# Patient Record
Sex: Male | Born: 1955 | Race: Black or African American | Hispanic: No | Marital: Married | Smoking: Never smoker
Health system: Southern US, Community
[De-identification: ages and names within clinical notes are randomized; demographics above are authoritative.]

## PROBLEM LIST (undated history)

## (undated) DIAGNOSIS — B20 Human immunodeficiency virus [HIV] disease: Secondary | ICD-10-CM

## (undated) DIAGNOSIS — Z21 Asymptomatic human immunodeficiency virus [HIV] infection status: Secondary | ICD-10-CM

## (undated) DIAGNOSIS — I255 Ischemic cardiomyopathy: Secondary | ICD-10-CM

## (undated) DIAGNOSIS — K219 Gastro-esophageal reflux disease without esophagitis: Secondary | ICD-10-CM

## (undated) DIAGNOSIS — I2510 Atherosclerotic heart disease of native coronary artery without angina pectoris: Secondary | ICD-10-CM

## (undated) DIAGNOSIS — I10 Essential (primary) hypertension: Secondary | ICD-10-CM

## (undated) DIAGNOSIS — I639 Cerebral infarction, unspecified: Secondary | ICD-10-CM

## (undated) HISTORY — DX: Gastro-esophageal reflux disease without esophagitis: K21.9

---

## 1997-12-03 ENCOUNTER — Encounter: Admission: RE | Admit: 1997-12-03 | Discharge: 1997-12-03 | Payer: Self-pay | Admitting: Internal Medicine

## 1998-03-28 ENCOUNTER — Encounter: Admission: RE | Admit: 1998-03-28 | Discharge: 1998-03-28 | Payer: Self-pay | Admitting: Internal Medicine

## 1998-06-23 ENCOUNTER — Encounter: Admission: RE | Admit: 1998-06-23 | Discharge: 1998-06-23 | Payer: Self-pay | Admitting: Internal Medicine

## 1998-08-15 ENCOUNTER — Encounter: Admission: RE | Admit: 1998-08-15 | Discharge: 1998-08-15 | Payer: Self-pay | Admitting: Internal Medicine

## 1998-09-19 ENCOUNTER — Encounter: Admission: RE | Admit: 1998-09-19 | Discharge: 1998-09-19 | Payer: Self-pay | Admitting: Internal Medicine

## 1998-12-04 ENCOUNTER — Encounter: Admission: RE | Admit: 1998-12-04 | Discharge: 1998-12-04 | Payer: Self-pay | Admitting: Hematology and Oncology

## 1999-01-08 ENCOUNTER — Encounter: Admission: RE | Admit: 1999-01-08 | Discharge: 1999-01-08 | Payer: Self-pay | Admitting: Hematology and Oncology

## 1999-01-29 ENCOUNTER — Encounter: Admission: RE | Admit: 1999-01-29 | Discharge: 1999-01-29 | Payer: Self-pay | Admitting: Hematology and Oncology

## 1999-05-07 ENCOUNTER — Ambulatory Visit (HOSPITAL_COMMUNITY): Admission: RE | Admit: 1999-05-07 | Discharge: 1999-05-07 | Payer: Self-pay | Admitting: Internal Medicine

## 1999-06-17 ENCOUNTER — Encounter: Admission: RE | Admit: 1999-06-17 | Discharge: 1999-06-17 | Payer: Self-pay | Admitting: Hematology and Oncology

## 1999-07-13 ENCOUNTER — Encounter: Admission: RE | Admit: 1999-07-13 | Discharge: 1999-07-13 | Payer: Self-pay | Admitting: Internal Medicine

## 1999-07-30 ENCOUNTER — Encounter: Admission: RE | Admit: 1999-07-30 | Discharge: 1999-07-30 | Payer: Self-pay | Admitting: Internal Medicine

## 1999-08-27 ENCOUNTER — Encounter: Admission: RE | Admit: 1999-08-27 | Discharge: 1999-08-27 | Payer: Self-pay | Admitting: Internal Medicine

## 1999-11-09 ENCOUNTER — Ambulatory Visit (HOSPITAL_COMMUNITY): Admission: RE | Admit: 1999-11-09 | Discharge: 1999-11-09 | Payer: Self-pay | Admitting: Internal Medicine

## 1999-11-09 ENCOUNTER — Encounter: Admission: RE | Admit: 1999-11-09 | Discharge: 1999-11-09 | Payer: Self-pay | Admitting: Internal Medicine

## 1999-11-10 ENCOUNTER — Other Ambulatory Visit: Admission: RE | Admit: 1999-11-10 | Discharge: 1999-11-10 | Payer: Self-pay | Admitting: *Deleted

## 1999-12-10 ENCOUNTER — Encounter: Admission: RE | Admit: 1999-12-10 | Discharge: 1999-12-10 | Payer: Self-pay | Admitting: Internal Medicine

## 2000-01-21 ENCOUNTER — Ambulatory Visit (HOSPITAL_COMMUNITY): Admission: RE | Admit: 2000-01-21 | Discharge: 2000-01-21 | Payer: Self-pay | Admitting: Hematology and Oncology

## 2000-01-21 ENCOUNTER — Encounter: Admission: RE | Admit: 2000-01-21 | Discharge: 2000-01-21 | Payer: Self-pay | Admitting: Hematology and Oncology

## 2000-02-08 ENCOUNTER — Ambulatory Visit (HOSPITAL_COMMUNITY): Admission: RE | Admit: 2000-02-08 | Discharge: 2000-02-08 | Payer: Self-pay | Admitting: Internal Medicine

## 2000-02-08 ENCOUNTER — Encounter: Admission: RE | Admit: 2000-02-08 | Discharge: 2000-02-08 | Payer: Self-pay

## 2000-02-08 ENCOUNTER — Encounter: Payer: Self-pay | Admitting: Internal Medicine

## 2000-02-11 ENCOUNTER — Encounter: Admission: RE | Admit: 2000-02-11 | Discharge: 2000-02-11 | Payer: Self-pay | Admitting: Hematology and Oncology

## 2000-02-16 ENCOUNTER — Ambulatory Visit (HOSPITAL_COMMUNITY): Admission: RE | Admit: 2000-02-16 | Discharge: 2000-02-16 | Payer: Self-pay | Admitting: Internal Medicine

## 2000-02-16 ENCOUNTER — Encounter: Payer: Self-pay | Admitting: Internal Medicine

## 2000-02-16 ENCOUNTER — Encounter: Admission: RE | Admit: 2000-02-16 | Discharge: 2000-02-16 | Payer: Self-pay | Admitting: Internal Medicine

## 2000-02-19 ENCOUNTER — Inpatient Hospital Stay (HOSPITAL_COMMUNITY): Admission: EM | Admit: 2000-02-19 | Discharge: 2000-02-25 | Payer: Self-pay | Admitting: Emergency Medicine

## 2000-02-19 ENCOUNTER — Encounter (INDEPENDENT_AMBULATORY_CARE_PROVIDER_SITE_OTHER): Payer: Self-pay | Admitting: Specialist

## 2000-02-19 ENCOUNTER — Encounter: Payer: Self-pay | Admitting: Internal Medicine

## 2000-02-24 ENCOUNTER — Encounter: Payer: Self-pay | Admitting: Internal Medicine

## 2000-02-25 ENCOUNTER — Encounter: Payer: Self-pay | Admitting: Internal Medicine

## 2000-03-03 ENCOUNTER — Encounter: Admission: RE | Admit: 2000-03-03 | Discharge: 2000-03-03 | Payer: Self-pay | Admitting: Hematology and Oncology

## 2000-03-07 ENCOUNTER — Encounter: Admission: RE | Admit: 2000-03-07 | Discharge: 2000-03-07 | Payer: Self-pay | Admitting: Internal Medicine

## 2000-03-08 ENCOUNTER — Inpatient Hospital Stay (HOSPITAL_COMMUNITY): Admission: EM | Admit: 2000-03-08 | Discharge: 2000-03-11 | Payer: Self-pay | Admitting: Emergency Medicine

## 2000-03-09 ENCOUNTER — Encounter: Payer: Self-pay | Admitting: Internal Medicine

## 2000-03-28 ENCOUNTER — Encounter: Admission: RE | Admit: 2000-03-28 | Discharge: 2000-03-28 | Payer: Self-pay | Admitting: Internal Medicine

## 2000-04-18 ENCOUNTER — Ambulatory Visit (HOSPITAL_COMMUNITY): Admission: RE | Admit: 2000-04-18 | Discharge: 2000-04-18 | Payer: Self-pay | Admitting: Internal Medicine

## 2000-04-18 ENCOUNTER — Encounter: Admission: RE | Admit: 2000-04-18 | Discharge: 2000-04-18 | Payer: Self-pay | Admitting: Internal Medicine

## 2000-05-09 ENCOUNTER — Encounter: Admission: RE | Admit: 2000-05-09 | Discharge: 2000-05-09 | Payer: Self-pay | Admitting: Internal Medicine

## 2000-05-11 ENCOUNTER — Encounter: Admission: RE | Admit: 2000-05-11 | Discharge: 2000-05-11 | Payer: Self-pay | Admitting: Internal Medicine

## 2000-05-20 ENCOUNTER — Ambulatory Visit (HOSPITAL_COMMUNITY): Admission: RE | Admit: 2000-05-20 | Discharge: 2000-05-20 | Payer: Self-pay | Admitting: *Deleted

## 2000-06-13 ENCOUNTER — Encounter: Admission: RE | Admit: 2000-06-13 | Discharge: 2000-06-13 | Payer: Self-pay | Admitting: Internal Medicine

## 2000-08-03 ENCOUNTER — Encounter: Admission: RE | Admit: 2000-08-03 | Discharge: 2000-08-03 | Payer: Self-pay | Admitting: Internal Medicine

## 2000-08-03 ENCOUNTER — Ambulatory Visit (HOSPITAL_COMMUNITY): Admission: RE | Admit: 2000-08-03 | Discharge: 2000-08-03 | Payer: Self-pay | Admitting: Internal Medicine

## 2000-08-23 ENCOUNTER — Encounter: Admission: RE | Admit: 2000-08-23 | Discharge: 2000-08-23 | Payer: Self-pay | Admitting: Hematology and Oncology

## 2000-09-26 ENCOUNTER — Encounter: Admission: RE | Admit: 2000-09-26 | Discharge: 2000-09-26 | Payer: Self-pay

## 2000-10-14 ENCOUNTER — Encounter: Payer: Self-pay | Admitting: Internal Medicine

## 2000-10-14 ENCOUNTER — Ambulatory Visit (HOSPITAL_COMMUNITY): Admission: RE | Admit: 2000-10-14 | Discharge: 2000-10-14 | Payer: Self-pay | Admitting: Internal Medicine

## 2000-10-14 ENCOUNTER — Encounter: Admission: RE | Admit: 2000-10-14 | Discharge: 2000-10-14 | Payer: Self-pay | Admitting: Internal Medicine

## 2001-09-28 ENCOUNTER — Inpatient Hospital Stay (HOSPITAL_COMMUNITY): Admission: EM | Admit: 2001-09-28 | Discharge: 2001-10-03 | Payer: Self-pay | Admitting: Emergency Medicine

## 2001-09-28 ENCOUNTER — Encounter: Payer: Self-pay | Admitting: Emergency Medicine

## 2001-09-29 ENCOUNTER — Encounter: Payer: Self-pay | Admitting: Internal Medicine

## 2006-08-09 DIAGNOSIS — I639 Cerebral infarction, unspecified: Secondary | ICD-10-CM | POA: Insufficient documentation

## 2007-04-02 ENCOUNTER — Encounter (INDEPENDENT_AMBULATORY_CARE_PROVIDER_SITE_OTHER): Payer: Self-pay | Admitting: Neurology

## 2007-04-02 ENCOUNTER — Inpatient Hospital Stay (HOSPITAL_COMMUNITY): Admission: EM | Admit: 2007-04-02 | Discharge: 2007-04-06 | Payer: Self-pay | Admitting: Emergency Medicine

## 2007-04-03 ENCOUNTER — Encounter (INDEPENDENT_AMBULATORY_CARE_PROVIDER_SITE_OTHER): Payer: Self-pay | Admitting: Neurology

## 2007-04-05 ENCOUNTER — Encounter (INDEPENDENT_AMBULATORY_CARE_PROVIDER_SITE_OTHER): Payer: Self-pay | Admitting: Neurology

## 2008-04-30 ENCOUNTER — Emergency Department (HOSPITAL_COMMUNITY): Admission: EM | Admit: 2008-04-30 | Discharge: 2008-04-30 | Payer: Self-pay | Admitting: Emergency Medicine

## 2010-12-22 NOTE — Discharge Summary (Signed)
Richard [name], Richard [name]           ACCOUNT NO.:  [id number]   MEDICAL RECORD NO.:  [id number]          PATIENT TYPE:  INP   LOCATION:  3037                         FACILITY:  [facility]   PHYSICIAN:  Pramod P. Pearlean Brownie, MD    DATE OF BIRTH:  06-Jul-1956   DATE OF ADMISSION:  04/02/2007  DATE OF DISCHARGE:  04/06/2007                               DISCHARGE SUMMARY   DIAGNOSES AT TIME OF DISCHARGE:  1. Right MCA infarct likely embolic though no source found.  2. Dyslipidemia.  3. Nonischemic cardiomyopathy with EF of 30-35%.  4. Human immunodeficiency virus positive.  5. Depression.  6. Shingles/herpes breakout.   MEDICINES AT TIME OF DISCHARGE:  1. Invega 3 mg a day.  2. Valacyclovir 1 g a day.  3. Viramune 200 mg b.i.d.  4. Emtriva 200 mg a day.  5. Aspirin 81 mg a day q.a.m. x2 weeks, then discontinue.  6. Aggrenox one q.h.s. x2 weeks, then increase to b.i.d.  7. Coreg 6.25 mg b.i.d.  8. Altace 2.5 mg a day.  9. Zocor 20 mg a day.   STUDIES PERFORMED:  1. CT of the brain on admission shows a high suspicion for an acute      right insular cortex/external capsule infarct involving the MCA      territory, no acute hemorrhage.  2. MRI of the brain shows moderate sized acute nonhemorrhagic infarct      right temporal lobe, periopercular and subinsular region infarct,      cervical spondylitic changes with spinal stenosis and cord      compression at C3, C4 incompletely evaluated.  3. MRA of the head shows abrupt cutoff of the proximal right middle      cerebral artery branch vessel consistent with the patient's acute      infarct.  4. 2-D echocardiogram shows EF of 35-40% with diffuse left ventricular      hypokinesis, moderate to severe global hypokinesis, no embolic      source.  5. Transcranial Doppler is performed, results pending.  6. Carotid Doppler shows no ICA stenosis.  7. EKG shows normal sinus rhythm with occasional PVC's, left axis      deviation, low voltage QRS.  inferior infarct age undetermined, ST      and T wave abnormality.  Consider anterolateral ischemia.      Additional cardiologist comment included no significant change      since last tracing.  8. Transesophageal echocardiogram shows no PFO, no ASA, negative      Doppler with negative bubble, no source of embolism, EF 30-35%.   LABORATORY STUDIES:  CBC with hemoglobin 13.1, hematocrit 38.9, white  blood cells 2, platelets 118, MCV 84.2.  Chemistry normal. Coagulation  studies normal, liver function tests normal.  Cardiac enzymes negative.  Cholesterol 176, triglycerides 67, HDL 27, LDL 136.  UA negative.  A  viral load pending.  BNP 123, CD4 less than 10.  TSH 1.69.  Homocystine  13.4.  HIV test is reactive.  Urine drug screen negative, acetone level  negative.  Alcohol level less than 5.  Hemoglobin A1c 5.7.   HISTORY  OF PRESENT ILLNESS:  Richard [name] is a 55 year old right-  handed black male with a history of HIV positivity.  He says his viral  load was 3000 and a CD-4 count was up around 100 at last check, and he  is on medications for that.  He has a history of depression treated with  Invega.  He has had a recent increase in his Invega dose from 3 to 6 mg  on Friday.  He has taken one dose.  He was in his normal state of health  the day prior to admission around 6 p.m. when he fell asleep in front of  the TV.  He woke at 2 a.m. with slurred speech, left-sided weakness.  He  went back to bed.  The next morning his symptoms were still present so  he came to the urgent care.  Was seen by Dr. Perrin Maltese and referred to the  emergency room after speaking with Dr. Orlin Hilding who was on call.  He was  not a TPA candidate secondary to time of onset being greater than three  hours.  He has been admitted to the hospital for further stroke  evaluation with an NIH stroke scale at 2.   HOSPITAL COURSE:  MRI was positive for acute infarct in the right MCA  branch pattern.  It is felt to be embolic  though no source was  documented during hospitalization.  His EKG and telemetry strip showed  normal sinus rhythm.  He did have a low EF of 30-35% which may  contribute, and cardiology was consulted.  They plan to follow up as an  outpatient.  They did perform a TEE in the hospital which was negative  for embolic source.  Other risk factors identified during  hospitalization were dyslipidemia for which he was started on Zocor.  He  was started on Aggrenox for secondary stroke prevention.  From the  neurologic standpoint there is no need for Coumadin.  Of note his CD-4  level was less than 10, and his viral load is pending as somewhat  questionable.  He may have AIDS now as well.  Have asked him to follow  up his primary care at the [place].  Asked to follow up with cardiology  within one week and follow up  Dr. Delia Heady within one month for a TCT bubble study.   CONDITION ON DISCHARGE:  The patient alert and oriented x3.  No aphasia.  No dysarthria.  Eye movements are full.  Face symmetric.  No focal  deficits.  He had decreased fine motor movement on the left and he  orbits his right over the left.   DISCHARGE PLAN:  1. Discharge home with wife.  2. Aggrenox for stroke prevention.  3. New statin for elevated cholesterol.  LDL goal is less than 100.      This needs to be adjusted by primary care or cardiology.  4. New cardiac meds per cardiology.  He has been scheduled a follow-up      appointment with Dr. Tresa Endo on      September 4 at 10 a.m.  5. The patient needs outpatient CardioNet monitoring for atrial      fibrillation for possible embolic source.  6. Call Dr. Marlis Edelson office to schedule an outpatient bubble study and      emboli monitoring within one month.      Annie Main, N.P.    ______________________________  Sunny Schlein. Pearlean Brownie, MD    SB/MEDQ  D:  04/06/2007  T:  04/07/2007  Job:  [id number]   cc:   [facility]  Tresa Endo, M.D.

## 2010-12-22 NOTE — H&P (Signed)
Richard [name], Richard [name]           ACCOUNT NO.:  [id number]   MEDICAL RECORD NO.:  [id number]          PATIENT TYPE:  EMS   LOCATION:  MAJO                         FACILITY:  [facility]   PHYSICIAN:  Gustavus Messing. Orlin Hilding, M.D.DATE OF BIRTH:  1956-01-04   DATE OF ADMISSION:  04/02/2007  DATE OF DISCHARGE:                              HISTORY & PHYSICAL   CHIEF COMPLAINT:  Slurred speech and left-sided weakness, which is  improving.   HISTORY OF PRESENT ILLNESS:  Richard [name] is a 55 year old right-sided  handed black man with a history of HIV positivity.  He says his viral  load was 3000 and a CD4 was around 100.  He is on medications for that.  He also has a history of depression, treated with Invega.  He had a  recent increase in his Invega dose from 3 mg to 6 mg on Friday.  He took  1 dose.  He was in his normal state of health yesterday around 6 p.m.  when he fell asleep in front of the TV.  He woke up around 2 a.m. with  slurred speech, left-sided weakness.  He went to bed.  His symptoms were  not gone in the morning, so he came into urgent care and was seen by Dr.  Perrin Maltese and referred to the emergency room after speaking with me.   REVIEW OF SYSTEMS:  Twelve system review is significant for one episode  of vomiting today.  He has not had any nausea or vomiting prior to that.  He has HIV positivity and depression.  He has no known clotting  abnormalities.  No musculoskeletal complaints.  No cardiac complaints,  although he has a chronically abnormal EKG.  No chest pain.  No  shortness of breath.   PAST MEDICAL HISTORY:  Significant for HIV positive status on meds.  Depression.  He gets shingles and herpes breakouts related to his HIV.  He denies any cardiac history, but has had an abnormal EKG here in the  past and again today without clear acute abnormality seen.  He said he  previously had trouble with his cholesterol but not currently.  He is on  no meds for that.  No history  of atrial fibrillation or hypertension.   MEDICATIONS:  1. Invega 6 mg a day for the last 2 days.  Previously it was 3 mg a      day.  2. Emtricitabine 200 mg once a day.  3. Nevirapine 200 mg twice a day.  4. Valacyclovir 1 gram daily.  Normally he takes acyclovir 200 mg 2      tablets 3 times a day.  He is not on that now that he is on the      valacyclovir.   Sometimes he takes sulfa, but not currently on that rotation.   SOCIAL HISTORY:  He is a Naval architect.  He is married.  No cigarette or  alcohol use.   FAMILY HISTORY:  Positive for stroke in his great grandmother.   OBJECTIVE:  VITALS:  Temperature is 96.6, blood pressure 112/70, heart  rate 72, respirations 16, 98% saturation.  HEENT:  Head is normocephalic, atraumatic.  NECK:  Supple without bruits  HEART:  Regular rate and rhythm.  LUNGS:  Clear to auscultation.  ABDOMEN:  Positive bowel sounds.  SKIN:  He is a quarter sized herpetic lesion in the suprapubic area.  NEUROLOGIC:  He is awake, alert, oriented.  Pupils are equal and  reactive.  Visual fields are full.  Extraocular movements are intact.  Facial sensation is normal.  Facial motor activity shows a left facial  droop.  Hearing is intact.  Palate is symmetric and tongue is midline.  He has a normal shoulder shrug.  On motor exam, he has got no drift or  satelliting.  Normal bulk, tone and strength.  5/5 strength bilaterally.  Normal rapid fine movements.  No clumsiness.  Deep tendon reflexes are  2+.  He has an upgoing toe on the left.  He has a downgoing toe on the  right.  Coordination - finger-to-nose and heel-to-shin are intact.  Sensory exam is normal.  There is no extinction.   On the NIH stroke scale he scores a 2.  He is alert - gets 0.  Answers  two questions correctly - gets a 0.  Obeys two commands correctly - gets  a 0.  Normal gaze - gets a 0.  No visual loss - gets a 0.  He has minor  facial weakness on the left - gets a 1.  No drift in the  upper or lower  extremities, right or left - gets a 0 for all of those.  No ataxia -  gets a 0.  Normal sensory - gets a 0.  No aphasia - gets a 0.  Mild  dysarthria - gets a 1.  No neglect - gets a 0.  So his total score is 2.  Modified Rankin scale is 0.   LABORATORY DATA:  CMET is fairly normal.  Glucose is 100.  CBC shows a  low white blood cell count of 2.4, which I believe may be chronic for  him.  PT is 12.9, INR 1.0, PTT 26.  CT and MRI are pending at this time.   ASSESSMENT:  Left hemiparesis with dysarthria and left facial droop,  sudden onset overnight with resolution of extremity weakness, but  persisting facial droop and dysarthria.  Patient has no risk factors,  but he is HIV positive and depressed.  He had a recent change in his  medicines.  Need to rule out TIA or stroke in the right brain, rule out  medication-related.  He also has HIV, which is apparently quiescent.  He  had an abnormal EKG without any acute changes.   PLAN:  We will admit him for a stroke workup.  I will reduce the Invega  back to 3 mg a day.  That can be addressed with his primary care  physicians and psychiatrist at a later date if his rules in or out for  stroke.  We will put him on aspirin and he will get the workup.  He is  not a TPA candidate, as there has been greater than 3 hours since  symptoms onset and his stroke severity is too mild with a NIH of 2.      Catherine A. Orlin Hilding, M.D.  Electronically Signed     CAW/MEDQ  D:  04/02/2007  T:  04/02/2007  Job:  [id number]

## 2010-12-25 NOTE — Discharge Summary (Signed)
[place]. [facility]  Patient:    Richard [name], Richard [name]                  MRN: [id number] Adm. Date:  [id number] Disc. Date: [id number] Attending:  Alfonso Ramus CC:         [facility]  Nancee Liter, M.D.   Discharge Summary  DATE OF BIRTH:  03/27/1956  DISCHARGE DIAGNOSES: 1. Human immunodeficiency virus. 2. Vomiting. 3. Dehydration. DD:  03/11/00 TD:  03/14/00 Job: [id number]

## 2010-12-25 NOTE — Consult Note (Signed)
[place]. [facility]  Patient:    Richard [name], Richard [name]                  MRN: [id number] Proc. Date: 02/21/00 Adm. Date:  [id number] Attending:  Alfonso Ramus CC:         Richard [name], M.D.                          Consultation Report  REASON FOR CONSULTATION:  Richard [name] is a 55 year old [place] man referred for evaluation of lymphadenopathy and an abnormal spleen.  Richard [name] was found to be HIV positive in 1993.  He was referred to the [facility] and received treatment with AZT and another drug for some months.  He tolerated these poorly however and he was essentially off treatment since 1993 to approximately one to two weeks ago when he presented with a variety of symptoms including thrush, diarrhea, and vertigo.  The patients diarrhea is being treated empirically with Flagyl and Cipro.  His vertigo is improved.  His thrush is also improved on Diflucan.  The vertigo was evaluated with a CT of the brain which was essentially negative.  A chest x-ray obtained 02/19/00 showed a prominent right hilum and CT of the chest on 02/20/00 showed right paratracheal lymphadenopathy measuring up to 2.5 cm, a subcarinal mass measuring up to 3.2 cm, and a right hilar lymph node measuring up to 2.5 cm.  There are other right hilar lymph nodes as well as some areas of decreased density in the visualized portion of the spleen.  With this information, the patient was referred for further evaluation for possible lymphoma.  PAST MEDICAL HISTORY:  The patient has a history of psychotic depression under the care of Dr. Wynonia Lawman.  The patient has a history of urogenital HSV disease, currently under control.  PAST SURGICAL HISTORY:  Otherwise entirely negative.  FAMILY HISTORY:  He knows little about his father.  His mother is currently 35 years old and in good health.  He has two brothers and three sisters - all according to him in good  health.  SOCIAL HISTORY:  The patient worked a temporary in the [employer] for many years.  His wife of nine years is [place].  She has a child Richard [name] who is the patients stepson.  Richard [name] unfortunately is also HIV positive.  I do not know whether Richard [name] has been tested or not. 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  Richard [name] is currently unemployed.  They attend the [employer].  HEALTH MAINTENANCE:  He does not know his cholesterol level.  He smoked minimally when he was 55 years old.  He never abused alcohol or used any alcohol.  He does have a history of drug abuse although he tells me has not used any drugs over the past year.  The patient does "not yet" have a living will.  In case he would becomes medically incapacitated, he would name his wife Richard [name] as his healthcare power of attorney.  REVIEW OF SYSTEMS:  He has had cough which is frequently dry but occasionally productive of sputum which tends to be clear, occasionally slightly yellowish. He has had low-grade fevers but he does not have a thermometer.  He has had some weight loss and a good deal of fatigue.  There has been no rash.  His thrush is now much better and he can swallow with much less  difficulty.  He does not have a history of seizures, unusual headaches, nausea or vomiting, or change in bladder habits.  PHYSICAL EXAMINATION:  VITAL SIGNS:  He is a middle-aged black male with a temperature of 97.6, pulse of 74, respiratory rate between 16-24, and a blood pressure of 115/67.  His room air saturation is 99%.  His current weight is 165.5.  He is estimated to be 5 feet 9-1/2 inches tall.  NECK:  Careful examination shows no cervical, supraclavicular, or axillary adenopathy.  LUNGS:  No crackles or wheezes.  HEART:  Regular rate and rhythm without murmur.  ABDOMEN:  Soft and nontender.  I do not detect splenomegaly by exam.  MUSCULOSKELETAL:  No peripheral edema.  No focal spinal  tenderness.  NEUROLOGIC:  Nonfocal, and he is alert and oriented x 3.  LABORATORIES AT ADMISSION:  White cell count of 2.0, with an absolute granulocyte count of 1.4, absolute lymphocyte count of 0.4, his hemoglobin is 11.7, with an MCV of 74.2.  His platelet count is 163,000.  Sodium 129, potassium 3.4, chloride 98, carbon dioxide 23, glucose 115, BUN 15, creatinine 1.0, calcium 9.0.  Total protein 7.9, albumin 3.7, AST 49, ALT 31, alkaline phosphatase 72, bilirubin 0.4.  LDL is elevated at 369.  Lipase is 43.  APT is 15.4 seconds.  Amylase is 173.  APTT is 29.  Multiple cultures are currently pending.  He is a serum cryptococcal antigen negative. Magnesium was 1.9.  Phosphorus 2.2.  Vitamin B12 was 616.  FILMS:  As described.  IMPRESSION AND PLAN:  A 55 year old [place] man with a history of HIV positivity dating back to 1993, with a CD4 count on April 2001 of less than 10 cells and less than 1% CD4 cells, with mediastinal lymphadenopathy and inhomogeneities in the spleen suggestive of possible lymphoma.  The patient certainly could have lymphoma.  He could have sarcoid or he could any of number of infections including fungal and AFB infections.  The possibilities are too numerous for empiric treatment and he will require definitive biopsy.  I think the most direct and least traumatic of obtaining a lymph node would be mediastinoscopy and I have contacted Dr. Samuel Germany to suggest referral to ______ for this.  If the patient proves to have a non-Hodgkins lymphoma, we will follow up with him for appropriate treatment and he will call my office for an appointment in approximately two weeks.  The patient already has that information.  He would need other staging studies at that time.  If this is an infectious process or sarcoidosis, however, of course, we will discontinue follow up. DD:  02/21/00 TD:  02/21/00 Job: [id number]

## 2010-12-25 NOTE — Discharge Summary (Signed)
[place]. [facility]  Patient:    Richard [name], Richard [name]                  MRN: [id number] Adm. Date:  [id number] Disc. Date: [id number] Attending:  Alfonso Ramus Dictator:   Adrian Prows CC:         Nancee Liter, M.D., outpatient clinic             [facility]             Valentino Hue. Magrinat, M.D., oncology             Kathlee Nations Suann Larry, M.D., CVTS                           Discharge Summary  DATE OF BIRTH:  11-30-1955.  DISCHARGE DIAGNOSES: 1. Mediastinal mass. 2. Status post mediastinoscopy and transbronchial biopsy. 3. Chronic diarrhea. 4. Human immunodeficiency virus. 5. Depression with psychotic features. 6. Herpes simplex virus perirectal ulcer. 7. Oral thrush. 8. Anemia. 9. History of thrombocytopenia.  DISCHARGE MEDICATIONS: 1. Septra one p.o. q.d. 2. Diflucan 200 mg p.o. b.i.d. 3. Combivir one p.o. b.i.d. 4. Nelfinavir 1250 mg p.o. b.i.d. 5. Prozac 20 mg p.o. q.d. 6. Darvocet-N 100 as per Kathlee Nations Trigt III, M.D. 7. Zyprexa 5 mg p.o. q.d. 8. Acyclovir 800 mg p.o. t.i.d.  FOLLOW-UP: 1. As scheduled with Valentino Hue. Magrinat, M.D. 2. As scheduled with Kathlee Nations Suann Larry, M.D. 3. Scheduled in outpatient clinic with Nancee Liter, M.D., and Adrian Prows    on March 03, 2000, at 3:30 p.m.  Please check perianal ulcer, thrush, the    patients symptoms of diarrhea and vomiting.  Please also check viral load    two to three weeks after July 1j9, 2001.  Please check CBC with    differential with special attention to neutropenia.  Also call or the    patient may call _______ center at [phone] regarding temporary    disability.  CONSULTANTS: 1. Valentino Hue. Magrinat, M.D., oncologist. 2. Kathlee Nations Suann Larry, M.D., CVTS.  PROCEDURES: 1. February 19, 2000, chest x-ray. 2. February 20, 2000, CT of the chest with right paratracheal node 2.5 x 1.8 cm,    subcarinal mass 2.7 x 3.2 cm, 1.8 x 2.5 cm right hilar node and several    smaller  right hilar nodes. 3. February 20, 2000, CT of the head was negative. 4. February 24, 2000, mediastinoscopy and transbronchial biopsy.  HISTORY OF PRESENT ILLNESS:  The patient is a 55 year old male with HIV disease with CD4 of less than 10 who presented with vomiting x several days, diarrhea x several months, cough x several weeks with a chest x-ray as an outpatient showing lymphadenopathy.  He was admitted for work-up, diagnosis and treatment.  HOSPITAL COURSE:  #1 -  VOMITING.  Work-up for that problem included lipase of 43, normal LFTs, except for AST of 47; urinalysis showing small leukocytes and positive ketones but otherwise normal and blood glucose of 96.  Viral gastroenteritis, disseminated infection, and GI involvement, lymphoma were all possibilities entertained as causative. Medications were also considered and Cipro and Flagyl were discontinued after admission without immediate asset.  However, the patients vomiting spontaneously resolved two days after admission before other medications could be discontinued.  #2 - DIARRHEA.  Diarrhea was treated symptomatically with Imodium and stool studies were performed.  Giardia, Cryptosporidium, Microsporidium, and AFB smear were all negative as were  C. difficile x 3.  A serum AFB culture was pending at the patients discharge.  #3 - COUGH AND MEDIASTINAL LYMPHADENOPATHY.  Though the cough resolved during hospitalization, chest CT was performed on February 20, 2000, showing 2.5 x 1.8 cm right paratracheal node, 2.7 x 3.2 cm subcarinal mass, 1.8 x 2.5 cm right hilar nodes and several other smaller right hilar nodes.  Cardiovascular and thoracic surgery was consulted and agreed to perform mediastinoscopy and bronchoscopy with biopsy on February 24, 2000.  The patient was stable after surgery and was discharged to home on February 25, 2000.  DISCHARGE LABS:  White blood cell count 1.7, hemoglobin 9.6, hematocrit 28.1, platelets 144. Sodium 134,  potassium 3.7, chloride 102, bicarb 25, glucose 117, BUN 8, creatinine 0.9, calcium 8.3, phosphorus 2.2, magnesium 1.9. DD:  02/25/00 TD:  02/29/00 Job: [id number]

## 2010-12-25 NOTE — Cardiovascular Report (Signed)
[place]. [facility]  Patient:    Richard [name], Richard [name] Visit Number: [id number] MRN: [id number]          Service Type: MED Location: [phone] Attending Physician:  Madaline Guthrie Dictated by:   Lennette Bihari, M.D. Proc. Date: 10/02/01 Admit Date:  09/28/2001   CC:         Madaline Guthrie, M.D.  Cardiac Catheterization Laboratory  Orville Govern, R.N.   Cardiac Catheterization  PROCEDURES PERFORMED: Right and left heart catheterization.  INDICATIONS: The patient is a 55 year old, African American gentleman, with a history of positive HIV, as well as positive PPD, on therapy. The patient was seen in cardiology consultation with midsternal chest burning and abnormal ECG. An echocardiogram was performed with showed reduced LV function with an ejection fraction of approximately 20%. The patient subsequently underwent Cardiolite scan with suggested anterolateral scar, that of ischemia. Ejection fraction was 33%. The patient is now referred for right and left heart catheterization.  DESCRIPTION OF PROCEDURE: Right and left heart catheterization was done via the right femoral artery and right femoral vein approach. Swan-Ganz catheterization was done under hemodynamic monitoring. Cardiac output determinations were made both by the Fick and thermodilution method. Pigtail catheter was inserted for simultaneous wedge pressure recording as well as left ventriculography. Cine coronary angiography was performed with a 6 French Judkins 4 left and right coronary catheters. The pigtail catheter was reinserted for distal aortography.  HEMODYNAMIC DATA: Right atrial pressure, A wave of 7, mean 5. Right ventricular pressure 24/5. Pulmonary artery pressure 24/15. Mean pulmonary capillary wedge pressure 10. Central aortic pressure 106/69. Left ventricular pressure 106/16.  O2 saturation in the pulmonary artery was 71% and aorta 95%.  Cardiac output by  thermodilution method was 9.1 and by the Fick method was 5.4 L/min. Cardiac index was 4.5, and 2.7 L/min. per sq m, respectively. A hemoglobin of 13.8 was used for the Fick determination.  Biplane cine left ventriculography revealed moderate LV dysfunction. Hypocontractility was seen anterolaterally as well as inferiorly on the RAO projection. There was significant inferoapical inferolateral to lateral inferior hypo to akinesis on the ileo projection. Calculated ejection fraction was 38%; however, this appeared to over estimate the qualitative visual assessment.  ANGIOGRAPHIC DATA:  The left main coronary artery was angiographically normal and essentially trifurcated into an LAD, a ramus intermedius/high diagonal vessel, and left circumflex coronary artery.  The LAD was angiographically normal. The high diagonal/intermediate type vessel was angiographically normal.  The left circumflex coronary artery was a large caliber dominant vessel.  The right coronary artery had an aberrant somewhat variant takeoff and rosacea superiorly. This was angiographically normal and nondominant.  DISTAL AORTOGRAM: Distal aortography did not demonstrate any renal artery stenosis. There was no significant aortoiliac disease.  IMPRESSION: 1. Nonischemic dilated cardiomyopathy with a calculated ejection fraction of    38% with wall motion abnormalities involving the anterior intralateral,    inferior, infralateral wall. 2. Essentially normal coronary arteries. 3. Normal right-sided heart pressures. 4. No evidence for aortoiliac disease. Dictated by:   Lennette Bihari, M.D. Attending Physician:  Madaline Guthrie DD:  10/02/01 TD:  10/02/01 Job: [id number]

## 2010-12-25 NOTE — Discharge Summary (Signed)
[place]. [facility]  Patient:    Richard [name], Richard [name] Visit Number: [id number] MRN: [id number]          Service Type: MED Location: [phone] Attending Physician:  Phifer, Trinna Post Dictated by:   Juanell Fairly, M.D. Admit Date:  09/28/2001 Discharge Date: 10/03/2001   CC:         Infectious Disease Clinic at the [facility] in [place], Phone #[phone], Debby Freiberg                           Discharge Summary  DATE OF BIRTH:  May 09, 1956  DISCHARGE DIAGNOSES: 1. Chest pain. 2. Human immunodeficiency virus positive. 3. Hyperlipidemia. 4. Purified protein derivative positive.  DISCHARGE MEDICATIONS:  1. Protonix 40 mg p.o. q.d.  2. Altace 1.25 mg p.o. b.i.d.  3. Coreg 3.25 mg b.i.d.  4. Aspirin one q.d.  5. Vitamin B6 50 mg p.o. q.d.  6. Isoniazid 300 mg p.o. q.d.  7. Acyclovir 200 mg p.o. b.i.d.  8. Crixivan 400 mg q.8h.  9. Combivir b.i.d. 10. Zyprexa 5 mg p.o. q.h.s.  DIET:  He was discharged with instructions to maintain a low fat, low cholesterol diet.  FOLLOW-UP:  [facility] [place] [facility] in two weeks.  They are expecting his phone call.  HOSPITAL COURSE:  Richard [name] is a 55 year old man with a history of HIV, who presents with two week history of burning substernal chest pain.  The pain comes and goes for hours at a time , is worse when lying flat and improves on sitting up and not associated with meals.  It is not associated with shortness of breath but it is associated with nausea.  The pain radiates to his neck and his left arm feels numb when he gets the pain.  He had a similar pain last year which was treated with an unknown stomach pill and the patient improved. He also complains of dark stools of increased frequency for one month.  He has no other complaints.  PAST MEDICAL HISTORY:  Includes HIV diagnosed in 1993, followed at the [facility]. He is currently on HAART.  As  per the patient, his last CD4 count was 268. His last viral load was 800 and those were done in January 2003.  PPD positive, March 2002.  The patient is still on INH and B6.  He has had no symptoms nor has he had any chest x-ray findings.  History of mediastinal lymphadenopathy. It is being followed by CT scan. There is no significant change since its discovery.  It has not been biopsied.  Questionable psychiatry history as the patient is on Zyprexa.  PHYSICAL EXAMINATION:  VITAL SIGNS ON ADMISSION:  Temperature of 97.6, blood pressure 126/82, heart rate 88, respiratory rate 16, O2 saturations 100% on room air.  HEENT:  Significant for dentures.  CARDIOVASCULAR:  S1 and S2 positive with regular rate and rhythm.  No murmurs, rubs or gallops.  LUNGS:  Clear to auscultation bilaterally.  ABDOMEN:  He did have epigastric tenderness although his abdomen was soft and not distended.  His bowel sounds were positive.  RECTAL:  He was hemoccult negative.  LABORATORY DATA ON ADMISSION:  White count 4.7, hemoglobin 13.5, platelet count 178.  His MCV was 90.6. His RDW was 15.4.  Sodium was 139, potassium 3.8, chloride 106, bicarbonate 25, BUN 14, creatinine 1.2, glucose 100, calcium  9.2.  AST was 44, ALT 31, alkaline phosphatase 74, total bilirubin 1.5, total protein 7.8, albumin 3.6.  Chest x-ray showed no acute disease.  EKG showed normal sinus rhythm, left axis and T wave inversions in V2 through V6.  Questionable left anterior fascicular block. There was no prior EKG for comparison.  HOSPITAL COURSE:  #1 - CHEST PAIN:  Although the patients history is more suggestive of GI complaints than cardiac, EKG changes and the initial CK was 1529, suggest possible cardiac ischemia, therefore the patient was admitted to telemetry. Enzymes were checked q.8h.  He was put on heparin drip, nitroglycerin drip, 25 mg b.i.d. of metoprolol and an aspirin a day.  A 2D echocardiogram was performed  which showed an ejection fraction of 15 to 25% with left ventricular dilation and left ventricular hypokinesis.  A Cardiolite stress test was performed which showed no stress induced ischemia but an anterior inferolateral scar with an ejection fraction of 33%.  The patient therefore underwent cardiac catheterization which revealed nonischemic cardiomyopathy with normal coronary arteries.  Cardiology recommended that the patient be switched from metoprolol to carvedilol and titrated from 3.1 to 5 b.i.d. to 6.25 b.i.d. to 12.5 b.i.d. to 25 b.i.d. at two week intervals.  The patients cardiac enzymes went from 1529 to 465 to 500.  The CK MBs went from 4.7 to 4.8 to 5.1.  The troponin I went from 0.04 to 0.02 to 0.03.  The patients medicines were reviewed looking for a cause for increased CKs without increased troponin and with normal coronary arteries.  It was discovered that lamivudine does cause increased CK with rhabdomyolysis. I spoke with the infectious disease on call person who suggested that we allowed them to make adjustments to his medications rather than trying to change them in house,  #2 - HUMAN IMMUNODEFICIENCY VIRUS POSITIVE:  The patient is on HAART therapy. Again lamivudine which is part of Combivir is associated with increased CPKs and rhabdomyolysis which could be the cause of the patients increased cardiac enzymes.  #3 - HYPERLIPIDEMIA:  The patients cholesterol on a fasting lipid panel was 234.  His HDL was 40 and his LDL was 178. It is highly recommended that this patient be placed on a statin.  Again I spoke with infectious disease people at [facility] and they requested that we allow them to start the statin secondary to numerous interactions with antiretroviral medicines.  #4 - PURIFIED PROTEIN DERIVATIVE POSITIVITY:  The patient is on INH and B6. This was continued in house.  The patients chest x-ray showed no acute disease.  The patient had no symptoms of  TB.  SUGGESTED FOLLOW UP ITEMS:  Again the patients increased CKs in the presence  of normal coronary arteries and normal troponin I would seem to reflect a noncardiac process.  The most likely in this patient is lamivudine. If there is a different drug that the infectious disease physicians would consider that might be prudent to change that.  Hyperlipidemia:  The patient will need to be placed on a statin.  My understanding is that pravastatin is the drug of choice among the patients on antiretrovirals but I will defer this to his infectious disease physicians at the [facility]. Dictated by:   Juanell Fairly, M.D. Attending Physician:  Phifer, Trinna Post DD:  10/04/01 TD:  10/05/01 Job: [id number]

## 2010-12-25 NOTE — Op Note (Signed)
[place]. [facility]  Patient:    Richard [name], Richard [name]                  MRN: [id number] Proc. Date: 02/24/00 Adm. Date:  [id number] Attending:  Alfonso Ramus CC:         CVTS office                           Operative Report  OPERATION:  Bronchoscopy and mediastinoscopy.  PREOPERATIVE DIAGNOSIS:  Mediastinal adenopathy with history of human immunodeficiency virus infection.  POSTOPERATIVE DIAGNOSIS:  Mediastinal adenopathy with history of human immunodeficiency virus infection.  SURGEON:  Mikey Bussing, M.D.  ANESTHESIA:  General.  INDICATIONS:  The patient is a young male with a known several year history of HIV infection who presented with diarrhea and nonproductive cough.  Chest x-ray showed prominent paratracheal tissue and a CT scan demonstrated hilar adenopathy and paratracheal adenopathy with clear lung fields.  He was referred for mediastinoscopy and lymph node biopsy to evaluate for possible lymphoma versus adenopathy related to his chronic disease.  I discussed the procedures of bronchoscopy and mediastinoscopy with the patient.  I discussed the use of general anesthesia, the location of the surgical incision, and the associated risks of bleeding and wound infection.  He understood these aspects of the operation and risks and agree to proceed with the operation as planned under informed consent.  PROCEDURE:  The patient was brought to the operating room and placed upon an operating table where general anesthesia was induced and the patient was intubated.  Through the endotracheal tube a fiberoptic bronchoscope was placed and the trachea was examined and found to be normal.  The carinii was sharp and normal.  The airways had no endobronchial lesions on inspection of the right main stem bronchus, the right lobar bronchi, and the right lung segmental bronchial orifices.  The bronchoscope was then passed into the left lung and the  left main stem bronchus, left lobar bronchi, and the left lung segmental bronchial orifices were all examined and found to be without endobronchial lesion.  Washings were sent for culture.  The bronchoscope was then withdrawn.  The chest was then prepped and draped as a sterile field and a small incision was made in the suprasternal notch.  The dissection was taken down into the pretracheal plane and the mediastinoscope was passed into the superior mediastinum.  There was some enlarged lymphatic tissue which was biopsied and submitted for permanent sections.  There was minimal bleeding at the end of the procedure and the wound was irrigated and closed in layers using Vicryl and a subcuticular Vicryl for the skin.  The patient was extubated and returned to the recovery room in stable condition. DD:  02/25/00 TD:  02/25/00 Job: [id number]

## 2010-12-25 NOTE — Discharge Summary (Signed)
[place]. [facility]  Patient:    Richard [name], Richard [name]                  MRN: [id number] Adm. Date:  [id number] Disc. Date: [id number] Attending:  Alfonso Ramus Dictator:   Nancee Liter, M.D.                           Discharge Summary  NO DICTATION DD:  03/11/00 TD:  03/14/00 Job: [id number]

## 2010-12-25 NOTE — Discharge Summary (Signed)
[place]. [facility]  Patient:    Richard [name], Richard [name]                  MRN: [id number] Adm. Date:  [id number] Disc. Date: [id number] Attending:  Alfonso Ramus Dictator:   Adrian Prows, MS4 CC:         [facility]  Nancee Liter, M.D.  [facility]  [place] T. Hoxworth, M.D.  Valentino Hue. Magrinat, M.D.   Discharge Summary  DATE OF BIRTH:  01-31-1956  DISCHARGE DIAGNOSES:  1. Human immunodeficiency virus.  2. Vomiting.  3. Diarrhea.  4. Dehydration.  5. Mediastinal lymphadenopathy.  DISCHARGE MEDICATIONS:  1. Fluconazole 200 mg 1 p.o. q.d.  2. Septra DS 1 p.o. q.d.  3. Combivir 1 p.o. b.i.d.  4. Nelfinavir 1250 mg p.o. b.i.d.  5. Zyprexa 5 mg 1 p.o. b.i.d.  6. Prozac 20 mg 1 p.o. q.d.  7. Acyclovir 800 mg 1 p.o. t.i.d.  8. Protonix 40 mg 1 p.o. q.d.  9. Tums 500 mg 1 p.o. t.i.d. 10. Imodium p.r.n. 11. Phenergan p.r.n.  FOLLOWUP:  With Dr. Nancee Liter at [facility] on August 20 at 3 oclock p.m.  Please check a CBC with special attention to anemia as well as CD4 count and viral load as the patient has been on HAART therapy for four to six weeks at this point.  Please also check whether or not the patients diarrhea has resolved as additional medication changes may need to be performed at this time.  PROCEDURES:. CT of the abdomen done on March 09, 2000, showing no retroperitoneal  nodes, spleen showing multiple small radiolucencies suspicious for disseminated candidiasis or Mycobacterium avium which was unchanged from a prior CT of the thorax.  The CT also showed a significant hiatal hernia.  CONSULTANTS:  Dr. Orvan Falconer of infectious disease and Dr. Johna Sheriff of general surgery.  ADMITTING HISTORY AND PHYSICAL:  This is a 55 year old African-American male with AIDS, now with vomiting and lymphadenopathy.  The patient was discharged from [facility] February 25, 2000, after a mediastinoscopy  and biopsy which failed to get lymph node tissue and was, therefore, nondiagnostic of mediastinal lymphadenopathy.  He returned to clinic one day prior to admission for followup and reported severe vomiting of everything he ate times several days without hematemesis.  This symptom were present during his previous admission but had spontaneously resolved prior to discharge.  Belatedly, the patient had a chronic diarrhea during his last admission which was worked up with ______ microsporidian as the smear, C. difficile, ova, parasites, white blood cells, and culture all negative.  He had been treated for this empirically with ciprofloxacin and Flagyl without resolution of his diarrhea.  The patient was admitted for hydration and reevaluation of his lymphadenopathy and possible biopsy.  Surgery was consulted for evaluation of inguinal lymph node biopsy in order to rule out MAC or lymphoma.  HOSPITAL COURSE:  #1 - LYMPHADENOPATHY: Dr. Johna Sheriff of general surgery saw this patient on the day after admission and was not able to palpate any significant peripheral lymphadenopathy.  A CT of the abdomen was done in order to assess for retroperitoneal lymphadenopathy as the patient was unwilling to undergo a second mediastinoscopy with attempt to collect second specimen of his mediastinal biopsy.  This CT showed no evidence of retroperitoneal lymphadenopathy or other abdominal or pelvic lymphadenopathy which might be amenable to biopsy.  In order to rule out lymphoma or Mycobacterium avium, bone marrow  biopsy was considered but was deferred secondary to the patients improved clinical status and lack of fever.  #2 - VOMITING:  Infiltration of the gastrointestinal tract by Mycobacterium avium and lymphoma were both considered possibilities.  CT of the abdomen did not show any evidence of these possibilities but did show a hiatal hernia which was treated with proton pump inhibitor for treatment of  the reflux.  CT also showed some gallbladder sludge and slightly dilated pancreatic duct, but with negative exam, this was not considered likely the cause of his vomiting. The patients vomiting also resolved during his hospital without treatment beyond symptomatic that mentioned above.  Empiric MAC treatment was not begun secondary to the patients lack of fever and to the resolution of his symptoms but should be considered in the future.  A bone marrow biopsy may be helpful in assessment of this possibility.  #3 - DIARRHEA:  The patient continued to have diarrhea intermittently throughout his hospitalization and could possibly be related to his HAART therapy.  He was treated with Imodium symptomatically and given Tums in attempt to decrease the diarrhea possibly related to Nelfinavir treatment.  If Tums do not resolve his diarrhea, other possibilities of medication to reduce diarrhea should be considered and other medication changes may need to be made.  #4 - INCREASED ALKALINE PHOSPHATASE:  The patients alkaline phosphatase level was elevated at 183 on admission and GGT was done to check for source of ______ to the liver in this patient and was pending on discharge due to the possibility of bone marrow infiltration as above.  If the GGT is normal, this may also explain the patients anemia and thrombocytopenia, although medication should not be ruled out. Stool studies during this admission included C. difficile, AFB, ova and parasites, blood cells, and were all again negative.  DISCHARGE LABORATORY DATA:  White blood cell 2.0, hemoglobin 9.2, hematocrit 26.2, platelet 146.  Alkaline phosphatase 180.  GGT pending.  Sodium 137,  potassium 3.7, chloride 108, bicarb 26, BUN 4, creatinine 1.0, glucose 94, calcium 8.2, total bilirubin 0.6, AST 48, ALT 25, total protein 6.9, albumin 3.2. DD:  03/11/00 TD:  03/14/00 Job: [id number]

## 2011-05-10 LAB — URINALYSIS, ROUTINE W REFLEX MICROSCOPIC
Bilirubin Urine: NEGATIVE
Hgb urine dipstick: NEGATIVE
Ketones, ur: NEGATIVE
Nitrite: NEGATIVE
Protein, ur: NEGATIVE
Urobilinogen, UA: 2 — ABNORMAL HIGH

## 2011-05-10 LAB — BASIC METABOLIC PANEL
BUN: 13
CO2: 27
GFR calc non Af Amer: >60
Glucose, Bld: 93
Potassium: 4.1
Sodium: 135

## 2011-05-10 LAB — CBC
HCT: 39.7
Hemoglobin: 13.3
MCHC: 33.4
Platelets: 130 — ABNORMAL LOW
RDW: 14.2

## 2011-05-21 LAB — HIV 1/2 CONFIRMATION: HIV-1 antibody: POSITIVE

## 2011-05-21 LAB — HIV-1 RNA QUANT-NO REFLEX-BLD: HIV-1 RNA Quant, Log: 4.67 — ABNORMAL HIGH (ref <1.70)

## 2011-05-21 LAB — PROTIME-INR
INR: 0.9
INR: 1
Prothrombin Time: 12.8
Prothrombin Time: 12.9

## 2011-05-21 LAB — POCT I-STAT 3, ART BLOOD GAS (G3+)
Bicarbonate: 24.4 — ABNORMAL HIGH
TCO2: 25
pCO2 arterial: 36
pH, Arterial: 7.439

## 2011-05-21 LAB — DIFFERENTIAL
Eosinophils Relative: 4
Lymphocytes Relative: 12
Lymphs Abs: 0.3 — ABNORMAL LOW
Monocytes Relative: 13 — ABNORMAL HIGH

## 2011-05-21 LAB — HIV ANTIBODY (ROUTINE TESTING W REFLEX): HIV: REACTIVE — AB

## 2011-05-21 LAB — DRUG SCREEN PANEL (SERUM)
Methadone (Dolophine), Serum: NEGATIVE
Opiates, Blood: NEGATIVE
Phencyclidine, Serum: NEGATIVE
Propoxyphene,Serum: NEGATIVE

## 2011-05-21 LAB — COMPREHENSIVE METABOLIC PANEL
AST: 22
Albumin: 3.7
Alkaline Phosphatase: 66
BUN: 10
CO2: 28
Calcium: 8.7
Calcium: 8.8
Creatinine, Ser: 1.11
GFR calc Af Amer: >60
GFR calc non Af Amer: >60
Potassium: 3.5
Sodium: 138
Sodium: 140
Total Protein: 6.7
Total Protein: 6.8

## 2011-05-21 LAB — BASIC METABOLIC PANEL
Calcium: 8.5
GFR calc Af Amer: >60
GFR calc non Af Amer: >60
Glucose, Bld: 92
Potassium: 3.6
Sodium: 142

## 2011-05-21 LAB — URINALYSIS, ROUTINE W REFLEX MICROSCOPIC
Glucose, UA: NEGATIVE
Protein, ur: NEGATIVE
Specific Gravity, Urine: 1.024
Urobilinogen, UA: 1

## 2011-05-21 LAB — KETONES, QUALITATIVE: Acetone, Bld: NEGATIVE

## 2011-05-21 LAB — CBC
HCT: 38.9 — ABNORMAL LOW
Hemoglobin: 13.3
MCHC: 33.3
MCHC: 33.7
MCV: 84
Platelets: 118 — ABNORMAL LOW
Platelets: 127 — ABNORMAL LOW
RBC: 4.69
RDW: 14
RDW: 14.7 — ABNORMAL HIGH
WBC: 2.2 — ABNORMAL LOW

## 2011-05-21 LAB — CARDIAC PANEL(CRET KIN+CKTOT+MB+TROPI)
CK, MB: 1.7
Relative Index: 0.5
Troponin I: 0.03

## 2011-05-21 LAB — T-HELPER CELLS (CD4) COUNT (NOT AT ARMC)
CD4 % Helper T Cell: 1 — ABNORMAL LOW
CD4 T Cell Abs: <10 — ABNORMAL LOW

## 2011-05-21 LAB — RAPID URINE DRUG SCREEN, HOSP PERFORMED: Barbiturates: NOT DETECTED

## 2011-05-21 LAB — LIPID PANEL
HDL: 27 — ABNORMAL LOW
LDL Cholesterol: 136 — ABNORMAL HIGH
Total CHOL/HDL Ratio: 6.5
Triglycerides: 67
VLDL: 13

## 2011-05-21 LAB — ETHANOL: Alcohol, Ethyl (B): <5

## 2011-05-21 LAB — CK TOTAL AND CKMB (NOT AT ARMC)
CK, MB: 1.7
Relative Index: 0.8

## 2011-05-21 LAB — HOMOCYSTEINE: Homocysteine: 13.4

## 2011-05-21 LAB — HEMOGLOBIN A1C: Mean Plasma Glucose: 126

## 2011-05-21 LAB — TROPONIN I: Troponin I: 0.04

## 2012-01-12 DIAGNOSIS — F29 Unspecified psychosis not due to a substance or known physiological condition: Secondary | ICD-10-CM | POA: Diagnosis not present

## 2012-02-07 DIAGNOSIS — D1039 Benign neoplasm of other parts of mouth: Secondary | ICD-10-CM | POA: Diagnosis not present

## 2012-02-14 DIAGNOSIS — K1321 Leukoplakia of oral mucosa, including tongue: Secondary | ICD-10-CM | POA: Diagnosis not present

## 2012-02-17 DIAGNOSIS — K1321 Leukoplakia of oral mucosa, including tongue: Secondary | ICD-10-CM | POA: Diagnosis not present

## 2012-07-11 DIAGNOSIS — F29 Unspecified psychosis not due to a substance or known physiological condition: Secondary | ICD-10-CM | POA: Diagnosis not present

## 2012-11-01 ENCOUNTER — Emergency Department (INDEPENDENT_AMBULATORY_CARE_PROVIDER_SITE_OTHER)
Admission: EM | Admit: 2012-11-01 | Discharge: 2012-11-01 | Disposition: A | Discharge status: Nursing Home (Includes ICF) | Payer: Medicare Other | Source: Home / Self Care

## 2012-11-01 ENCOUNTER — Emergency Department (HOSPITAL_COMMUNITY)
Admission: EM | Admit: 2012-11-01 | Discharge: 2012-11-01 | Disposition: A | Discharge status: Home / Self Care | Payer: Medicare Other | Attending: Emergency Medicine | Admitting: Emergency Medicine

## 2012-11-01 ENCOUNTER — Encounter (HOSPITAL_COMMUNITY): Payer: Self-pay | Admitting: Emergency Medicine

## 2012-11-01 ENCOUNTER — Encounter (HOSPITAL_COMMUNITY): Payer: Self-pay | Admitting: *Deleted

## 2012-11-01 DIAGNOSIS — R109 Unspecified abdominal pain: Secondary | ICD-10-CM

## 2012-11-01 DIAGNOSIS — R1032 Left lower quadrant pain: Secondary | ICD-10-CM | POA: Diagnosis not present

## 2012-11-01 DIAGNOSIS — Z21 Asymptomatic human immunodeficiency virus [HIV] infection status: Secondary | ICD-10-CM | POA: Insufficient documentation

## 2012-11-01 DIAGNOSIS — Z7982 Long term (current) use of aspirin: Secondary | ICD-10-CM | POA: Insufficient documentation

## 2012-11-01 DIAGNOSIS — I2510 Atherosclerotic heart disease of native coronary artery without angina pectoris: Secondary | ICD-10-CM | POA: Diagnosis not present

## 2012-11-01 DIAGNOSIS — Z79899 Other long term (current) drug therapy: Secondary | ICD-10-CM | POA: Diagnosis not present

## 2012-11-01 DIAGNOSIS — R1031 Right lower quadrant pain: Secondary | ICD-10-CM | POA: Diagnosis not present

## 2012-11-01 DIAGNOSIS — I10 Essential (primary) hypertension: Secondary | ICD-10-CM | POA: Insufficient documentation

## 2012-11-01 HISTORY — DX: Human immunodeficiency virus (HIV) disease: B20

## 2012-11-01 HISTORY — DX: Asymptomatic human immunodeficiency virus (hiv) infection status: Z21

## 2012-11-01 HISTORY — DX: Atherosclerotic heart disease of native coronary artery without angina pectoris: I25.10

## 2012-11-01 HISTORY — DX: Essential (primary) hypertension: I10

## 2012-11-01 LAB — CBC WITH DIFFERENTIAL/PLATELET
Basophils Absolute: 0 10*3/uL (ref 0.0–0.1)
Basophils Relative: 0 % (ref 0–1)
HCT: 38.1 % — ABNORMAL LOW (ref 39.0–52.0)
Hemoglobin: 13.3 g/dL (ref 13.0–17.0)
Lymphocytes Relative: 30 % (ref 12–46)
MCHC: 34.9 g/dL (ref 30.0–36.0)
Monocytes Relative: 8 % (ref 3–12)
Neutro Abs: 2.7 10*3/uL (ref 1.7–7.7)
Neutrophils Relative %: 61 % (ref 43–77)
WBC: 4.4 10*3/uL (ref 4.0–10.5)

## 2012-11-01 LAB — URINALYSIS, MICROSCOPIC ONLY
Nitrite: NEGATIVE
Specific Gravity, Urine: 1.019 (ref 1.005–1.030)
Urobilinogen, UA: 1 mg/dL (ref 0.0–1.0)
pH: 5.5 (ref 5.0–8.0)

## 2012-11-01 LAB — COMPREHENSIVE METABOLIC PANEL
Albumin: 3.9 g/dL (ref 3.5–5.2)
Alkaline Phosphatase: 66 U/L (ref 39–117)
BUN: 11 mg/dL (ref 6–23)
Calcium: 9.2 mg/dL (ref 8.4–10.5)
Creatinine, Ser: 1.27 mg/dL (ref 0.50–1.35)
GFR calc Af Amer: 72 mL/min — ABNORMAL LOW (ref >90)
Glucose, Bld: 97 mg/dL (ref 70–99)
Potassium: 3.4 mEq/L — ABNORMAL LOW (ref 3.5–5.1)
Total Protein: 7.7 g/dL (ref 6.0–8.3)

## 2012-11-01 LAB — LIPASE, BLOOD: Lipase: 47 U/L (ref 11–59)

## 2012-11-01 MED ORDER — OXYCODONE-ACETAMINOPHEN 5-325 MG PO TABS: 1.0000 | ORAL_TABLET | Freq: Once | ORAL | Status: DC

## 2012-11-01 MED ORDER — ONDANSETRON 4 MG PO TBDP
8.0000 mg | ORAL_TABLET | Freq: Once | ORAL | Status: AC
  Administered 2012-11-01: 8 mg via ORAL
  Filled 2012-11-01: qty 2

## 2012-11-01 MED ORDER — OXYCODONE-ACETAMINOPHEN 5-325 MG PO TABS
1.0000 | ORAL_TABLET | Freq: Once | ORAL | Status: AC
  Administered 2012-11-01: 1 via ORAL
  Filled 2012-11-01: qty 1

## 2012-11-01 NOTE — ED Notes (Signed)
Pt is here for abd pain onset 1730 States he ate fish but he had some yest as well w/no prob.  Pain is constant and it radiates to the back; taking naproxen w/little relief Denies: inj/trauma, f/v/n/d, constipation, urinary prob.   He is alert and oriented w/no signs of acute distress.

## 2012-11-01 NOTE — ED Provider Notes (Signed)
History     CSN: [id number]  Arrival date & time 11/01/12  1833   None     Chief Complaint  Patient presents with  . Abdominal Pain    (Consider location/radiation/quality/duration/timing/severity/associated sxs/prior treatment) HPI Comments: Date 57-year-old male with chills of abdominal pain around 5:30 PM today. He initially pointed to the bilateral lower quadrants, mid quadrants the epigastrium and pain radiating to his back bilaterally. States the pain is constant and rated an 8/10 at the onset. St is now a 6/10. He took some Naprosyn but states it made it worse. Nothing makes it better. He has noticed no blood in his stool.   Past Medical History  Diagnosis Date  . HIV (human immunodeficiency virus infection)   . Hypertension     History reviewed. No pertinent past surgical history.  No family history on file.  History  Substance Use Topics  . Smoking status: Never Smoker   . Smokeless tobacco: Not on file  . Alcohol Use: No      Review of Systems  Constitutional: Positive for activity change. Negative for fever and fatigue.  HENT: Negative.   Respiratory: Negative.   Gastrointestinal: Positive for abdominal pain. Negative for vomiting, diarrhea and abdominal distention.  Genitourinary: Negative.   Skin: Negative.   Neurological: Negative.   Hematological: Negative for adenopathy. Does not bruise/bleed easily.    Allergies  Review of patient's allergies indicates no known allergies.  Home Medications   Current Outpatient Rx  Name  Route  Sig  Dispense  Refill  . aspirin 81 MG chewable tablet   Oral   Chew 81 mg by mouth daily.         [name] emtricitabine-tenofovir (TRUVADA) 200-300 MG per tablet   Oral   Take 1 tablet by mouth daily.         [name] guaifenesin (ROBITUSSIN) 100 MG/5ML syrup   Oral   Take 200 mg by mouth 3 (three) times daily as needed for cough.         [name] lisinopril (PRINIVIL,ZESTRIL) 20 MG tablet   Oral   Take 20 mg by mouth  daily.         [name] lopinavir-ritonavir (KALETRA) 200-50 MG per tablet   Oral   Take 2 tablets by mouth 2 (two) times daily.         . metoprolol tartrate (LOPRESSOR) 25 MG tablet   Oral   Take 25 mg by mouth 2 (two) times daily.         [name] omeprazole (PRILOSEC) 20 MG capsule   Oral   Take 20 mg by mouth daily.         . pravastatin (PRAVACHOL) 80 MG tablet   Oral   Take 80 mg by mouth daily.         . raltegravir (ISENTRESS) 400 MG tablet   Oral   Take 400 mg by mouth 2 (two) times daily.         . risperiDONE (RISPERDAL) 2 MG tablet   Oral   Take 2 mg by mouth 2 (two) times daily.         . risperidone (RISPERDAL) 4 MG tablet   Oral   Take 4 mg by mouth 2 (two) times daily.         . traZODone (DESYREL) 100 MG tablet   Oral   Take 100 mg by mouth at bedtime.           BP 119/69  Pulse 67  Temp(Src) 97.9 F (36.6 C) (Oral)  Resp 18  SpO2 100%  Physical Exam  Nursing note and vitals reviewed. Constitutional: He is oriented to person, place, and time. He appears well-developed and well-nourished. No distress.  Eyes: EOM are normal.  Neck: Neck supple.  Cardiovascular: Normal rate, regular rhythm and normal heart sounds.   Pulmonary/Chest: Effort normal and breath sounds normal. No respiratory distress. He has no wheezes.  Abdominal: Soft. Bowel sounds are normal. He exhibits no mass. There is tenderness. There is no rebound and no guarding.  Tenderness in the epigastrium and right upper quadrant. Mild tenderness surrounding the umbilicus.  Lymphadenopathy:    He has no cervical adenopathy.  Neurological: He is alert and oriented to person, place, and time. He exhibits normal muscle tone.  Skin: Skin is warm and dry.  Psychiatric: He has a normal mood and affect.    ED Course  Procedures (including critical care time)  Labs Reviewed - No data to display No results found.   1. Abdominal pain of multiple sites       MDM  Patient is  being transferred to the emergency department for abdominal pain. He rated it 8/10 at the onset late this afternoon it is a 6/10 now. Is complaining of pain across the mid and upper abdomen.  Hayden Rasmussen, NP 11/01/12 2030

## 2012-11-01 NOTE — ED Notes (Signed)
Pt c/o lower abdominal pina sine 5:30, denies n/v/d, dysuria, rectal or urinary bleeding, or fevers.  States when pain started he had a BM, since then the pain has intensified.  Pain 6/10

## 2012-11-01 NOTE — ED Provider Notes (Signed)
History     CSN: [id number]  Arrival date & time 11/01/12  2048   First MD Initiated Contact with Patient 11/01/12 2220      Chief Complaint  Patient presents with  . Abdominal Pain     Patient is a 57 y.o. male presenting with abdominal pain. The history is provided by the patient.  Abdominal Pain Pain location:  LLQ and RLQ Pain quality: aching   Pain radiates to:  L flank and R flank Pain severity:  Moderate Onset quality:  Gradual Duration:  5 hours Progression:  Resolved Chronicity:  New Relieved by:  Nothing Associated symptoms: no chest pain, no constipation, no cough, no diarrhea, no dysuria, no fever, no hematemesis, no hematochezia, no melena, no nausea and no vomiting   PT reports he was at his baseline health when he developed abdominal pain It is now improving No cp/sob No fever/vomiting He has never had this before He had normal bowel movement without blood prior to arrival to the ER  Past Medical History  Diagnosis Date  . HIV (human immunodeficiency virus infection)   . Hypertension   . Coronary artery disease     History reviewed. No pertinent past surgical history.  History reviewed. No pertinent family history.  History  Substance Use Topics  . Smoking status: Never Smoker   . Smokeless tobacco: Not on file  . Alcohol Use: No      Review of Systems  Constitutional: Negative for fever and diaphoresis.  Respiratory: Negative for cough.   Cardiovascular: Negative for chest pain.  Gastrointestinal: Positive for abdominal pain. Negative for nausea, vomiting, diarrhea, constipation, melena, hematochezia and hematemesis.  Genitourinary: Positive for flank pain. Negative for dysuria.  Musculoskeletal: Negative for back pain.  Neurological: Negative for weakness.  Psychiatric/Behavioral: Negative for agitation.  All other systems reviewed and are negative.    Allergies  Ivp dye  Home Medications   Current Outpatient Rx  Name  Route   Sig  Dispense  Refill  . aspirin 81 MG chewable tablet   Oral   Chew 81 mg by mouth daily.         [name] emtricitabine-tenofovir (TRUVADA) 200-300 MG per tablet   Oral   Take 1 tablet by mouth daily.         [name] lisinopril (PRINIVIL,ZESTRIL) 20 MG tablet   Oral   Take 20 mg by mouth daily.         [name] lopinavir-ritonavir (KALETRA) 200-50 MG per tablet   Oral   Take 2 tablets by mouth 2 (two) times daily.         . metoprolol tartrate (LOPRESSOR) 25 MG tablet   Oral   Take 25 mg by mouth 2 (two) times daily.         [name] omeprazole (PRILOSEC) 20 MG capsule   Oral   Take 20 mg by mouth daily.         . pravastatin (PRAVACHOL) 80 MG tablet   Oral   Take 80 mg by mouth daily.         . raltegravir (ISENTRESS) 400 MG tablet   Oral   Take 400 mg by mouth 2 (two) times daily.         . risperiDONE (RISPERDAL) 2 MG tablet   Oral   Take 2 mg by mouth 2 (two) times daily.         . traZODone (DESYREL) 100 MG tablet   Oral   Take 100 mg by mouth  at bedtime.           BP 127/84  Pulse 63  Temp(Src) 99 F (37.2 C) (Oral)  Resp 17  SpO2 99%  Physical Exam CONSTITUTIONAL: Well developed/well nourished HEAD: Normocephalic/atraumatic EYES: EOMI/PERRL ENMT: Mucous membranes moist NECK: supple no meningeal signs SPINE:entire spine nontender CV: S1/S2 noted, no murmurs/rubs/gallops noted LUNGS: Lungs are clear to auscultation bilaterally, no apparent distress ABDOMEN: soft, nontender, no rebound or guarding GU:no cva tenderness, no hernia noted.   NEURO: Pt is awake/alert, moves all extremitiesx4, gait normal EXTREMITIES: pulses normal, full ROM SKIN: warm, color normal PSYCH: no abnormalities of mood noted  ED Course  Procedures   Labs Reviewed  COMPREHENSIVE METABOLIC PANEL - Abnormal; Notable for the following:    Potassium 3.4 (*)    GFR calc non Af Amer 62 (*)    GFR calc Af Amer 72 (*)    All other components within normal limits  CBC WITH  DIFFERENTIAL - Abnormal; Notable for the following:    HCT 38.1 (*)    Platelets 135 (*)    All other components within normal limits  LIPASE, BLOOD  URINALYSIS, MICROSCOPIC ONLY    Pt now improved, resting comfortably No focal tenderness on my evaluation He reports h/o HIV but is "undetectable" and is med compliant His labs are reassuring.   He was sent down from urgent care for further eval, but by the time of my eval he was improved  do not feel emergent imaging necessary at this time I do not suspect acute abdominal/urologic/vascular emergency at this tme   MDM   Nursing notes including past medical history and social history reviewed and considered in documentation Labs/vital reviewed and considered        Date: 11/01/2012  Rate: 55  Rhythm: normal sinus rhythm  QRS Axis: left  Intervals: normal  ST/T Wave abnormalities: nonspecific ST changes  Conduction Disutrbances:none  Narrative Interpretation:   Old EKG Reviewed: none available  At time of interpretation    Joya Gaskins, MD 11/01/12 2350

## 2012-11-01 NOTE — ED Provider Notes (Signed)
Medical screening examination/treatment/procedure(s) were performed by resident physician or non-physician practitioner and as supervising physician I was immediately available for consultation/collaboration.   KINDL,JAMES DOUGLAS MD.   James D Kindl, MD 11/01/12 2053 

## 2012-11-01 NOTE — ED Notes (Signed)
MD at bedside. 

## 2013-05-01 DIAGNOSIS — H2510 Age-related nuclear cataract, unspecified eye: Secondary | ICD-10-CM | POA: Diagnosis not present

## 2013-05-01 DIAGNOSIS — H3560 Retinal hemorrhage, unspecified eye: Secondary | ICD-10-CM | POA: Diagnosis not present

## 2013-05-24 ENCOUNTER — Encounter (HOSPITAL_COMMUNITY): Payer: Self-pay | Admitting: Emergency Medicine

## 2013-05-24 ENCOUNTER — Emergency Department (HOSPITAL_COMMUNITY)
Admission: EM | Admit: 2013-05-24 | Discharge: 2013-05-24 | Disposition: A | Discharge status: Home / Self Care | Payer: Medicare Other | Attending: Emergency Medicine | Admitting: Emergency Medicine

## 2013-05-24 DIAGNOSIS — R1084 Generalized abdominal pain: Secondary | ICD-10-CM | POA: Diagnosis not present

## 2013-05-24 DIAGNOSIS — I10 Essential (primary) hypertension: Secondary | ICD-10-CM | POA: Insufficient documentation

## 2013-05-24 DIAGNOSIS — I2510 Atherosclerotic heart disease of native coronary artery without angina pectoris: Secondary | ICD-10-CM | POA: Insufficient documentation

## 2013-05-24 DIAGNOSIS — Z21 Asymptomatic human immunodeficiency virus [HIV] infection status: Secondary | ICD-10-CM | POA: Diagnosis not present

## 2013-05-24 DIAGNOSIS — M549 Dorsalgia, unspecified: Secondary | ICD-10-CM | POA: Insufficient documentation

## 2013-05-24 DIAGNOSIS — Z79899 Other long term (current) drug therapy: Secondary | ICD-10-CM | POA: Diagnosis not present

## 2013-05-24 DIAGNOSIS — Z7982 Long term (current) use of aspirin: Secondary | ICD-10-CM | POA: Insufficient documentation

## 2013-05-24 DIAGNOSIS — R109 Unspecified abdominal pain: Secondary | ICD-10-CM

## 2013-05-24 LAB — URINALYSIS, ROUTINE W REFLEX MICROSCOPIC
Bilirubin Urine: NEGATIVE
Hgb urine dipstick: NEGATIVE
Ketones, ur: NEGATIVE mg/dL
Nitrite: NEGATIVE
Protein, ur: NEGATIVE mg/dL
Specific Gravity, Urine: 1.016 (ref 1.005–1.030)
Urobilinogen, UA: 2 mg/dL — ABNORMAL HIGH (ref 0.0–1.0)

## 2013-05-24 LAB — COMPREHENSIVE METABOLIC PANEL
AST: 20 U/L (ref 0–37)
CO2: 24 mEq/L (ref 19–32)
Calcium: 8.9 mg/dL (ref 8.4–10.5)
Creatinine, Ser: 1.31 mg/dL (ref 0.50–1.35)
GFR calc Af Amer: 69 mL/min — ABNORMAL LOW (ref >90)
GFR calc non Af Amer: 60 mL/min — ABNORMAL LOW (ref >90)
Glucose, Bld: 91 mg/dL (ref 70–99)
Sodium: 136 mEq/L (ref 135–145)
Total Protein: 7.4 g/dL (ref 6.0–8.3)

## 2013-05-24 LAB — CBC
MCH: 27.4 pg (ref 26.0–34.0)
MCHC: 34.9 g/dL (ref 30.0–36.0)
MCV: 78.5 fL (ref 78.0–100.0)
Platelets: 125 10*3/uL — ABNORMAL LOW (ref 150–400)
RBC: 4.74 MIL/uL (ref 4.22–5.81)

## 2013-05-24 MED ORDER — POTASSIUM CHLORIDE CRYS ER 20 MEQ PO TBCR
40.0000 meq | EXTENDED_RELEASE_TABLET | Freq: Once | ORAL | Status: AC
  Administered 2013-05-24: 40 meq via ORAL
  Filled 2013-05-24: qty 2

## 2013-05-24 MED ORDER — KETOROLAC TROMETHAMINE 60 MG/2ML IM SOLN
60.0000 mg | Freq: Once | INTRAMUSCULAR | Status: AC
  Administered 2013-05-24: 60 mg via INTRAMUSCULAR
  Filled 2013-05-24: qty 2

## 2013-05-24 NOTE — ED Provider Notes (Signed)
Medical screening examination/treatment/procedure(s) were performed by non-physician practitioner and as supervising physician I was immediately available for consultation/collaboration.   Gavin Pound. Oletta Lamas, MD 05/24/13 [phone]

## 2013-05-24 NOTE — ED Notes (Signed)
Pt reports right and left mid sharp, achy abdominal pain that started at 0500 this morning, as well as bilateral flank pain. Denies N/V/D/fever. Denies fall, injury, and any abnormal urinary symptoms.

## 2013-05-24 NOTE — ED Provider Notes (Signed)
CSN: [id number]     Arrival date & time 05/24/13  4098 History   First MD Initiated Contact with Patient 05/24/13 0732     Chief Complaint  Patient presents with  . Abdominal Pain  . Back Pain   (Consider location/radiation/quality/duration/timing/severity/associated sxs/prior Treatment) HPI Comments: 57 year old male with a past medical history of HIV, hypertension and CAD presents to the emergency department complaining of generalized abdominal pain beginning around 5:00 this morning. Patient states he has sharp and achy pains on both the right and the left side radiating around the sides to his back rated 6/10. Pain has been constant since. Denies nausea, vomiting, diarrhea, increased urinary frequency, urgency, dysuria or hematuria. No recent travel. States he had similar symptoms back in March, however pain was worse at that time. No specific findings at that encounter in the ED. No change in appetite.  Patient is a 57 y.o. male presenting with abdominal pain and back pain. The history is provided by the patient.  Abdominal Pain Back Pain Associated symptoms: abdominal pain     Past Medical History  Diagnosis Date  . HIV (human immunodeficiency virus infection)   . Hypertension   . Coronary artery disease    History reviewed. No pertinent past surgical history. No family history on file. History  Substance Use Topics  . Smoking status: Never Smoker   . Smokeless tobacco: Not on file  . Alcohol Use: No    Review of Systems  Gastrointestinal: Positive for abdominal pain.  Musculoskeletal: Positive for back pain.  All other systems reviewed and are negative.    Allergies  Ivp dye  Home Medications   Current Outpatient Rx  Name  Route  Sig  Dispense  Refill  . aspirin 81 MG chewable tablet   Oral   Chew 81 mg by mouth daily.         [name] emtricitabine-tenofovir (TRUVADA) 200-300 MG per tablet   Oral   Take 1 tablet by mouth daily.         [name] lisinopril  (PRINIVIL,ZESTRIL) 20 MG tablet   Oral   Take 20 mg by mouth daily.         [name] lopinavir-ritonavir (KALETRA) 200-50 MG per tablet   Oral   Take 2 tablets by mouth 2 (two) times daily.         . metoprolol tartrate (LOPRESSOR) 25 MG tablet   Oral   Take 25 mg by mouth 2 (two) times daily.         [name] omeprazole (PRILOSEC) 20 MG capsule   Oral   Take 20 mg by mouth daily.         . pravastatin (PRAVACHOL) 80 MG tablet   Oral   Take 80 mg by mouth daily.         . raltegravir (ISENTRESS) 400 MG tablet   Oral   Take 400 mg by mouth 2 (two) times daily.         . risperiDONE (RISPERDAL) 2 MG tablet   Oral   Take 2 mg by mouth 2 (two) times daily.         . traZODone (DESYREL) 100 MG tablet   Oral   Take 100 mg by mouth at bedtime.          BP 125/89  Pulse 62  Temp(Src) 97.8 F (36.6 C) (Oral)  Resp 16  SpO2 99% Physical Exam  Nursing note and vitals reviewed. Constitutional: He is oriented to person, place, and time.  He appears well-developed and well-nourished. No distress.  HENT:  Head: Normocephalic and atraumatic.  Mouth/Throat: Oropharynx is clear and moist.  Eyes: Conjunctivae and EOM are normal. Pupils are equal, round, and reactive to light. No scleral icterus.  Neck: Normal range of motion. Neck supple.  Cardiovascular: Normal rate, regular rhythm and normal heart sounds.   Pulmonary/Chest: Effort normal and breath sounds normal.  Abdominal: Soft. Normal appearance and bowel sounds are normal. He exhibits no distension and no mass. There is no tenderness. There is no rigidity, no rebound, no guarding and no CVA tenderness.  No peritoneal signs.  Musculoskeletal: Normal range of motion. He exhibits no edema.  Neurological: He is alert and oriented to person, place, and time.  Skin: Skin is warm and dry. He is not diaphoretic.  Psychiatric: He has a normal mood and affect. His behavior is normal.    ED Course  Procedures (including critical  care time) Labs Review Labs Reviewed  URINALYSIS, ROUTINE W REFLEX MICROSCOPIC - Abnormal; Notable for the following:    Urobilinogen, UA 2.0 (*)    All other components within normal limits  CBC - Abnormal; Notable for the following:    WBC 3.3 (*)    HCT 37.2 (*)    Platelets 125 (*)    All other components within normal limits  COMPREHENSIVE METABOLIC PANEL - Abnormal; Notable for the following:    Potassium 3.2 (*)    GFR calc non Af Amer 60 (*)    GFR calc Af Amer 69 (*)    All other components within normal limits  LIPASE, BLOOD   Imaging Review No results found.  EKG Interpretation   None       MDM   1. Abdominal pain     Patient with abdominal pain radiating around to back. He is well appearing and in NAD, normal vital signs. Abdomen soft, NT, ND. No associated symptoms. Given hx of HIV, last CD4 ct < 10 checked yesterday per patient, will check labs. Cbc, cmp, lipase, UA. Toradol for pain.  8:50 AM Patient reports great improvement after receiving toradol. Labs without any significant findings. Potassium replaced PO. UA clear. I do not feel any further testing is indicated at this time. Abdomen is soft, NT, ND.   At this time there does not appear to be any evidence of an acute emergency medical condition and the patient appears stable for discharge with appropriate outpatient follow up. Diagnosis was discussed with patient who verbalizes understanding and is agreeable to discharge. Return precautions discussed. Patient states understanding of treatment care plan and is agreeable.    Trevor Mace, PA-C 05/24/13 [phone]

## 2013-05-24 NOTE — ED Notes (Signed)
PA robyn at bedside.

## 2013-08-09 DIAGNOSIS — I255 Ischemic cardiomyopathy: Secondary | ICD-10-CM

## 2013-08-09 HISTORY — DX: Ischemic cardiomyopathy: I25.5

## 2013-10-05 ENCOUNTER — Encounter (HOSPITAL_COMMUNITY): Payer: Self-pay | Admitting: Emergency Medicine

## 2013-10-05 ENCOUNTER — Emergency Department (INDEPENDENT_AMBULATORY_CARE_PROVIDER_SITE_OTHER)
Admission: EM | Admit: 2013-10-05 | Discharge: 2013-10-05 | Disposition: A | Discharge status: Home / Self Care | Payer: Medicare Other | Source: Home / Self Care | Attending: Family Medicine | Admitting: Family Medicine

## 2013-10-05 DIAGNOSIS — J00 Acute nasopharyngitis [common cold]: Secondary | ICD-10-CM | POA: Diagnosis not present

## 2013-10-05 MED ORDER — IPRATROPIUM BROMIDE 0.06 % NA SOLN: 2.0000 | Freq: Four times a day (QID) | NASAL | Status: DC

## 2013-10-05 NOTE — ED Provider Notes (Signed)
CSN: [id number]     Arrival date & time 10/05/13  1002 History   First MD Initiated Contact with Patient 10/05/13 1141     Chief Complaint  Patient presents with  . URI   (Consider location/radiation/quality/duration/timing/severity/associated sxs/prior Treatment) HPI Comments: Reports that his main concern is nasal congestion and post nasal drainage that is causing his thraot to feel irritated. Denies fever.   Patient is a 58 y.o. male presenting with URI.  URI Presenting symptoms: congestion, cough, rhinorrhea and sore throat   Presenting symptoms: no fatigue and no fever   Severity:  Moderate Onset quality:  Gradual Timing:  Constant Progression:  Unchanged Chronicity:  New Ineffective treatments: no relief with claritin. Associated symptoms: no arthralgias, no headaches, no myalgias, no neck pain, no sinus pain, no sneezing, no swollen glands and no wheezing   Risk factors: immunosuppression     Past Medical History  Diagnosis Date  . HIV (human immunodeficiency virus infection)   . Hypertension   . Coronary artery disease    History reviewed. No pertinent past surgical history. No family history on file. History  Substance Use Topics  . Smoking status: Never Smoker   . Smokeless tobacco: Not on file  . Alcohol Use: No    Review of Systems  Constitutional: Negative for fever and fatigue.  HENT: Positive for congestion, rhinorrhea and sore throat. Negative for sneezing.   Respiratory: Positive for cough. Negative for wheezing.   Musculoskeletal: Negative for arthralgias, myalgias and neck pain.  Neurological: Negative for headaches.    Allergies  Ivp dye  Home Medications   Current Outpatient Rx  Name  Route  Sig  Dispense  Refill  . aspirin 81 MG chewable tablet   Oral   Chew 81 mg by mouth daily.         [name] emtricitabine-tenofovir (TRUVADA) 200-300 MG per tablet   Oral   Take 1 tablet by mouth daily.         [name] ipratropium (ATROVENT) 0.06 % nasal  spray   Each Nare   Place 2 sprays into both nostrils 4 (four) times daily.   15 mL   1   . lisinopril (PRINIVIL,ZESTRIL) 20 MG tablet   Oral   Take 20 mg by mouth daily.         [name] lopinavir-ritonavir (KALETRA) 200-50 MG per tablet   Oral   Take 2 tablets by mouth 2 (two) times daily.         [name] loratadine (CLARITIN) 10 MG tablet   Oral   Take 10 mg by mouth daily as needed for allergies.         . metoprolol tartrate (LOPRESSOR) 25 MG tablet   Oral   Take 25 mg by mouth 2 (two) times daily.         [name] omeprazole (PRILOSEC) 20 MG capsule   Oral   Take 20 mg by mouth daily.         . pravastatin (PRAVACHOL) 80 MG tablet   Oral   Take 80 mg by mouth daily.         . raltegravir (ISENTRESS) 400 MG tablet   Oral   Take 400 mg by mouth 2 (two) times daily.         . risperiDONE (RISPERDAL) 2 MG tablet   Oral   Take 1 mg by mouth 2 (two) times daily.          . traZODone (DESYREL) 100 MG tablet  Oral   Take 100 mg by mouth at bedtime.          BP 121/86  Pulse 76  Temp(Src) 98.9 F (37.2 C) (Oral)  Resp 20  SpO2 99% Physical Exam  Nursing note and vitals reviewed. Constitutional: He is oriented to person, place, and time. He appears well-developed and well-nourished. No distress.  HENT:  Head: Normocephalic and atraumatic.  Right Ear: Hearing, tympanic membrane, external ear and ear canal normal.  Left Ear: Hearing, tympanic membrane, external ear and ear canal normal.  Nose: Mucosal edema and rhinorrhea present.  Mouth/Throat: Uvula is midline. No oral lesions. No trismus in the jaw. No uvula swelling. Posterior oropharyngeal erythema present. No oropharyngeal exudate, posterior oropharyngeal edema or tonsillar abscesses.  ++post nasal drainage  Eyes: Conjunctivae are normal. Right eye exhibits no discharge. Left eye exhibits no discharge. No scleral icterus.  Neck: Normal range of motion. Neck supple.  Cardiovascular: Normal rate, regular  rhythm and normal heart sounds.   Pulmonary/Chest: Effort normal and breath sounds normal.  Musculoskeletal: Normal range of motion.  Lymphadenopathy:    He has no cervical adenopathy.  Neurological: He is alert and oriented to person, place, and time.  Skin: Skin is warm and dry.  Psychiatric: He has a normal mood and affect. His behavior is normal.    ED Course  Procedures (including critical care time) Labs Review Labs Reviewed - No data to display Imaging Review No results found.   MDM   1. Common cold    Common Cold: Will provide Rx for Atrovent nasal spray as patient's main concern is rhinorrhea, nasal congestion and post nasal drainage. Will advise regarding additional symptomatic care at home.    [place], [place] 10/05/13 1214

## 2013-10-05 NOTE — ED Notes (Signed)
Cold and sinus complaints.  Seen at the [facility] for sinus complaints and treated with loratadine--treatment did not help and patient reports his complaints have worsened.  C/o worsening cough, productive cough-white phlegm, lightheadedness, sinus drainage continues, scratchy throat.  Denies ear pain, denies fever

## 2013-10-05 NOTE — ED Notes (Signed)
Patient placed in gown and provided warm blankets

## 2013-10-07 NOTE — ED Provider Notes (Signed)
Medical screening examination/treatment/procedure(s) were performed by a resident physician or non-physician practitioner and as the supervising physician I was immediately available for consultation/collaboration.  Lynne Leader, MD    Gregor Hams, MD 10/07/13 [phone]

## 2013-11-05 ENCOUNTER — Other Ambulatory Visit (HOSPITAL_COMMUNITY)
Admission: RE | Admit: 2013-11-05 | Discharge: 2013-11-05 | Disposition: A | Discharge status: Home / Self Care | Payer: Medicare Other | Source: Ambulatory Visit | Attending: Emergency Medicine | Admitting: Emergency Medicine

## 2013-11-05 ENCOUNTER — Encounter (HOSPITAL_COMMUNITY): Payer: Self-pay | Admitting: Emergency Medicine

## 2013-11-05 ENCOUNTER — Emergency Department (INDEPENDENT_AMBULATORY_CARE_PROVIDER_SITE_OTHER)
Admission: EM | Admit: 2013-11-05 | Discharge: 2013-11-05 | Disposition: A | Discharge status: Home / Self Care | Payer: Medicare Other | Source: Home / Self Care | Attending: Emergency Medicine | Admitting: Emergency Medicine

## 2013-11-05 DIAGNOSIS — R300 Dysuria: Secondary | ICD-10-CM

## 2013-11-05 DIAGNOSIS — Z113 Encounter for screening for infections with a predominantly sexual mode of transmission: Secondary | ICD-10-CM | POA: Diagnosis not present

## 2013-11-05 LAB — POCT URINALYSIS DIP (DEVICE)
Bilirubin Urine: NEGATIVE
Glucose, UA: NEGATIVE mg/dL
HGB URINE DIPSTICK: NEGATIVE
Ketones, ur: NEGATIVE mg/dL
Nitrite: NEGATIVE
PH: 7 (ref 5.0–8.0)
PROTEIN: NEGATIVE mg/dL
SPECIFIC GRAVITY, URINE: 1.015 (ref 1.005–1.030)
UROBILINOGEN UA: 2 mg/dL — AB (ref 0.0–1.0)

## 2013-11-05 MED ORDER — DOXYCYCLINE HYCLATE 100 MG PO CAPS: 100.0000 mg | ORAL_CAPSULE | Freq: Two times a day (BID) | ORAL | Status: DC

## 2013-11-05 NOTE — ED Provider Notes (Signed)
CSN: [id number]     Arrival date & time 11/05/13  0820 History   First MD Initiated Contact with Patient 11/05/13 [phone]     Chief Complaint  Patient presents with  . Flank Pain   (Consider location/radiation/quality/duration/timing/severity/associated sxs/prior Treatment) Patient is a 58 y.o. male presenting with flank pain. The history is provided by the patient. No language interpreter was used.  Flank Pain This is a new problem. The current episode started 6 to 12 hours ago. The problem occurs constantly. Associated symptoms include abdominal pain. Nothing aggravates the symptoms. Nothing relieves the symptoms. He has tried nothing for the symptoms. The treatment provided no relief.  Pt reports similar in the past and had a uti.    Past Medical History  Diagnosis Date  . HIV (human immunodeficiency virus infection)   . Hypertension   . Coronary artery disease    History reviewed. No pertinent past surgical history. History reviewed. No pertinent family history. History  Substance Use Topics  . Smoking status: Never Smoker   . Smokeless tobacco: Not on file  . Alcohol Use: No    Review of Systems  Gastrointestinal: Positive for abdominal pain.  Genitourinary: Positive for flank pain.  All other systems reviewed and are negative.    Allergies  Ivp dye  Home Medications   Current Outpatient Rx  Name  Route  Sig  Dispense  Refill  . aspirin 81 MG chewable tablet   Oral   Chew 81 mg by mouth daily.         [name] lisinopril (PRINIVIL,ZESTRIL) 20 MG tablet   Oral   Take 20 mg by mouth daily.         [name] emtricitabine-tenofovir (TRUVADA) 200-300 MG per tablet   Oral   Take 1 tablet by mouth daily.         [name] ipratropium (ATROVENT) 0.06 % nasal spray   Each Nare   Place 2 sprays into both nostrils 4 (four) times daily.   15 mL   1   . lopinavir-ritonavir (KALETRA) 200-50 MG per tablet   Oral   Take 2 tablets by mouth 2 (two) times daily.         [name] loratadine  (CLARITIN) 10 MG tablet   Oral   Take 10 mg by mouth daily as needed for allergies.         . metoprolol tartrate (LOPRESSOR) 25 MG tablet   Oral   Take 25 mg by mouth 2 (two) times daily.         [name] omeprazole (PRILOSEC) 20 MG capsule   Oral   Take 20 mg by mouth daily.         . pravastatin (PRAVACHOL) 80 MG tablet   Oral   Take 80 mg by mouth daily.         . raltegravir (ISENTRESS) 400 MG tablet   Oral   Take 400 mg by mouth 2 (two) times daily.         . risperiDONE (RISPERDAL) 2 MG tablet   Oral   Take 1 mg by mouth 2 (two) times daily.          . traZODone (DESYREL) 100 MG tablet   Oral   Take 100 mg by mouth at bedtime.          BP 92/65  Pulse 87  Temp(Src) 98 F (36.7 C) (Oral)  Resp 16  SpO2 100% Physical Exam  Nursing note and vitals reviewed. Constitutional: He is  oriented to person, place, and time. He appears well-developed and well-nourished.  HENT:  Head: Normocephalic.  Eyes: EOM are normal.  Neck: Normal range of motion.  Pulmonary/Chest: Effort normal.  Abdominal: Soft. He exhibits no distension.  Musculoskeletal: Normal range of motion.  Neurological: He is alert and oriented to person, place, and time.  Skin: Skin is warm.  Psychiatric: He has a normal mood and affect.    ED Course  Procedures (including critical care time) Labs Review Labs Reviewed  POCT URINALYSIS DIP (DEVICE) - Abnormal; Notable for the following:    Urobilinogen, UA 2.0 (*)    Leukocytes, UA TRACE (*)    All other components within normal limits   Imaging Review No results found.   MDM Urine sent for gc and chlamydia      1. Dysuria    Rx for doxycycline   Cultures pending    Fransico Meadow, PA-C 11/05/13 [place], PA-C 11/05/13 [phone]

## 2013-11-05 NOTE — Discharge Instructions (Signed)

## 2013-11-05 NOTE — ED Notes (Signed)
C/o  Right and left sided flank pain .  "burning sensation".  Having urgency and frequency.  On set last night.

## 2013-11-05 NOTE — ED Provider Notes (Signed)
Medical screening examination/treatment/procedure(s) were performed by non-physician practitioner and as supervising physician I was immediately available for consultation/collaboration.  Olman Yono, M.D.  Ankur Snowdon C Aaleigha Bozza, MD 11/05/13 1227 

## 2013-11-06 LAB — URINE CYTOLOGY ANCILLARY ONLY
CHLAMYDIA, DNA PROBE: NEGATIVE
Neisseria Gonorrhea: NEGATIVE

## 2014-02-04 ENCOUNTER — Encounter (HOSPITAL_COMMUNITY): Payer: Self-pay | Admitting: Emergency Medicine

## 2014-02-04 ENCOUNTER — Emergency Department (HOSPITAL_COMMUNITY): Payer: Medicare Other

## 2014-02-04 ENCOUNTER — Emergency Department (INDEPENDENT_AMBULATORY_CARE_PROVIDER_SITE_OTHER)
Admission: EM | Admit: 2014-02-04 | Discharge: 2014-02-04 | Disposition: A | Discharge status: Hospice | Payer: Medicare Other | Source: Home / Self Care

## 2014-02-04 ENCOUNTER — Emergency Department (HOSPITAL_COMMUNITY)
Admission: EM | Admit: 2014-02-04 | Discharge: 2014-02-04 | Disposition: A | Discharge status: Home / Self Care | Payer: Medicare Other | Attending: Emergency Medicine | Admitting: Emergency Medicine

## 2014-02-04 DIAGNOSIS — M545 Low back pain, unspecified: Secondary | ICD-10-CM | POA: Diagnosis not present

## 2014-02-04 DIAGNOSIS — R0609 Other forms of dyspnea: Secondary | ICD-10-CM

## 2014-02-04 DIAGNOSIS — I2510 Atherosclerotic heart disease of native coronary artery without angina pectoris: Secondary | ICD-10-CM | POA: Insufficient documentation

## 2014-02-04 DIAGNOSIS — Z7982 Long term (current) use of aspirin: Secondary | ICD-10-CM | POA: Insufficient documentation

## 2014-02-04 DIAGNOSIS — Z21 Asymptomatic human immunodeficiency virus [HIV] infection status: Secondary | ICD-10-CM | POA: Insufficient documentation

## 2014-02-04 DIAGNOSIS — Z8673 Personal history of transient ischemic attack (TIA), and cerebral infarction without residual deficits: Secondary | ICD-10-CM | POA: Insufficient documentation

## 2014-02-04 DIAGNOSIS — R0989 Other specified symptoms and signs involving the circulatory and respiratory systems: Secondary | ICD-10-CM

## 2014-02-04 DIAGNOSIS — R079 Chest pain, unspecified: Secondary | ICD-10-CM | POA: Diagnosis not present

## 2014-02-04 DIAGNOSIS — Z8679 Personal history of other diseases of the circulatory system: Secondary | ICD-10-CM | POA: Diagnosis not present

## 2014-02-04 DIAGNOSIS — Z79899 Other long term (current) drug therapy: Secondary | ICD-10-CM | POA: Diagnosis not present

## 2014-02-04 DIAGNOSIS — I10 Essential (primary) hypertension: Secondary | ICD-10-CM | POA: Diagnosis not present

## 2014-02-04 HISTORY — DX: Cerebral infarction, unspecified: I63.9

## 2014-02-04 LAB — COMPREHENSIVE METABOLIC PANEL
ALT: 18 U/L (ref 0–53)
AST: 16 U/L (ref 0–37)
Albumin: 3.5 g/dL (ref 3.5–5.2)
Alkaline Phosphatase: 72 U/L (ref 39–117)
BUN: 10 mg/dL (ref 6–23)
CALCIUM: 9.2 mg/dL (ref 8.4–10.5)
CO2: 27 meq/L (ref 19–32)
CREATININE: 1.14 mg/dL (ref 0.50–1.35)
Chloride: 104 mEq/L (ref 96–112)
GFR, EST AFRICAN AMERICAN: 81 mL/min — AB (ref >90)
GFR, EST NON AFRICAN AMERICAN: 70 mL/min — AB (ref >90)
GLUCOSE: 75 mg/dL (ref 70–99)
Potassium: 4 mEq/L (ref 3.7–5.3)
Sodium: 143 mEq/L (ref 137–147)
TOTAL PROTEIN: 7.3 g/dL (ref 6.0–8.3)
Total Bilirubin: <0.2 mg/dL — ABNORMAL LOW (ref 0.3–1.2)

## 2014-02-04 LAB — CBC
HEMATOCRIT: 40.8 % (ref 39.0–52.0)
HEMOGLOBIN: 13.8 g/dL (ref 13.0–17.0)
MCH: 27.4 pg (ref 26.0–34.0)
MCHC: 33.8 g/dL (ref 30.0–36.0)
MCV: 81.1 fL (ref 78.0–100.0)
Platelets: 115 10*3/uL — ABNORMAL LOW (ref 150–400)
RBC: 5.03 MIL/uL (ref 4.22–5.81)
RDW: 14.4 % (ref 11.5–15.5)
WBC: 3.5 10*3/uL — ABNORMAL LOW (ref 4.0–10.5)

## 2014-02-04 LAB — TROPONIN I: Troponin I: <0.3 ng/mL (ref <0.30)

## 2014-02-04 MED ORDER — TRAMADOL HCL 50 MG PO TABS: 50.0000 mg | ORAL_TABLET | Freq: Four times a day (QID) | ORAL | Status: DC | PRN

## 2014-02-04 MED ORDER — SODIUM CHLORIDE 0.9 % IV SOLN
Freq: Once | INTRAVENOUS | Status: AC
  Administered 2014-02-04: 09:00:00 via INTRAVENOUS

## 2014-02-04 MED ORDER — ASPIRIN 81 MG PO CHEW
CHEWABLE_TABLET | ORAL | Status: AC
  Filled 2014-02-04: qty 4

## 2014-02-04 MED ORDER — ASPIRIN 81 MG PO CHEW
324.0000 mg | CHEWABLE_TABLET | Freq: Once | ORAL | Status: AC
  Administered 2014-02-04: 324 mg via ORAL

## 2014-02-04 MED ORDER — IBUPROFEN 400 MG PO TABS
600.0000 mg | ORAL_TABLET | Freq: Once | ORAL | Status: AC
  Administered 2014-02-04: 600 mg via ORAL
  Filled 2014-02-04 (×2): qty 1

## 2014-02-04 NOTE — ED Provider Notes (Signed)
CSN: [id number]     Arrival date & time 02/04/14  0930 History   First MD Initiated Contact with Patient 02/04/14 [phone]     Chief Complaint  Patient presents with  . Chest Pain     (Consider location/radiation/quality/duration/timing/severity/associated sxs/prior Treatment) Patient is a 58 y.o. male presenting with chest pain. The history is provided by the patient.  Chest Pain Associated symptoms: no abdominal pain, no back pain, no cough, no fever, no headache, no palpitations, no shortness of breath and not vomiting   pt c/o left cp for the past  3-4 days. Constant. Dull, moderate. Non radiating. Occurs at rest. No positional change. Not pleuritic. No relation to activity or exertion.  Denies hx cad, had cath several yrs ago showing no significant cad. States went to [place] w same pain 3-4 weeks ago, but no specific cause found. Denies skin changes, rash, or sts to area. No chest wall injury or strain. Denies cough or uri c/o. No fever or chills. No pleuritic pain, no leg pain or swelling. No immobility, trauma, travel, or surgery. No hx dvt or pe. No cocaine/drug use. Denies heartburn.      Past Medical History  Diagnosis Date  . HIV (human immunodeficiency virus infection)   . Hypertension   . Coronary artery disease   . Stroke    History reviewed. No pertinent past surgical history. History reviewed. No pertinent family history. History  Substance Use Topics  . Smoking status: Never Smoker   . Smokeless tobacco: Not on file  . Alcohol Use: No    Review of Systems  Constitutional: Negative for fever.  Eyes: Negative for redness.  Respiratory: Negative for cough and shortness of breath.   Cardiovascular: Positive for chest pain. Negative for palpitations and leg swelling.  Gastrointestinal: Negative for vomiting and abdominal pain.  Genitourinary: Negative for flank pain.  Musculoskeletal: Negative for back pain and neck pain.  Skin: Negative for rash.  Neurological:  Negative for headaches.  Hematological: Does not bruise/bleed easily.  Psychiatric/Behavioral: Negative for confusion.      Allergies  Ivp dye  Home Medications   Prior to Admission medications   Medication Sig Start Date End Date Taking? Authorizing Provider  aspirin 81 MG chewable tablet Chew 81 mg by mouth daily.   Yes Historical Provider, MD  ipratropium (ATROVENT) 0.06 % nasal spray Place 2 sprays into both nostrils 4 (four) times daily. 10/05/13  Yes Annett Gula Presson, PA  lisinopril (PRINIVIL,ZESTRIL) 20 MG tablet Take 20 mg by mouth daily.   Yes Historical Provider, MD  lopinavir-ritonavir (KALETRA) 200-50 MG per tablet Take 2 tablets by mouth 2 (two) times daily.   Yes Historical Provider, MD  loratadine (CLARITIN) 10 MG tablet Take 10 mg by mouth daily as needed for allergies.   Yes Historical Provider, MD  metoprolol tartrate (LOPRESSOR) 25 MG tablet Take 25 mg by mouth 2 (two) times daily.   Yes Historical Provider, MD  omeprazole (PRILOSEC) 20 MG capsule Take 20 mg by mouth daily.   Yes Historical Provider, MD  pravastatin (PRAVACHOL) 80 MG tablet Take 80 mg by mouth daily.   Yes Historical Provider, MD  raltegravir (ISENTRESS) 400 MG tablet Take 400 mg by mouth 2 (two) times daily.   Yes Historical Provider, MD  risperiDONE (RISPERDAL) 2 MG tablet Take 1 mg by mouth 2 (two) times daily.    Yes Historical Provider, MD  traZODone (DESYREL) 100 MG tablet Take 100 mg by mouth at bedtime.   Yes  Historical Provider, MD   BP 109/64  Pulse 78  Temp(Src) 97.9 F (36.6 C) (Oral)  Resp 13  SpO2 99% Physical Exam  Nursing note and vitals reviewed. Constitutional: He is oriented to person, place, and time. He appears well-developed and well-nourished. No distress.  HENT:  Mouth/Throat: Oropharynx is clear and moist.  Eyes: Conjunctivae are normal. No scleral icterus.  Neck: Neck supple. No tracheal deviation present.  Cardiovascular: Normal rate, regular rhythm, normal heart  sounds and intact distal pulses.  Exam reveals no gallop and no friction rub.   No murmur heard. Pulmonary/Chest: Effort normal and breath sounds normal. No accessory muscle usage. No respiratory distress. He exhibits tenderness.  Abdominal: Soft. Bowel sounds are normal. He exhibits no distension and no mass. There is no tenderness. There is no rebound and no guarding.  Genitourinary:  No cva tenderness  Musculoskeletal: Normal range of motion. He exhibits no edema and no tenderness.  Neurological: He is alert and oriented to person, place, and time.  Skin: Skin is warm and dry. No rash noted.  No rash/shingles to area of pain  Psychiatric: He has a normal mood and affect.    ED Course  Procedures (including critical care time) Labs Review  Results for orders placed during the hospital encounter of 02/04/14  CBC      Result Value Ref Range   WBC 3.5 (*) 4.0 - 10.5 K/uL   RBC 5.03  4.22 - 5.81 MIL/uL   Hemoglobin 13.8  13.0 - 17.0 g/dL   HCT 40.8  39.0 - 52.0 %   MCV 81.1  78.0 - 100.0 fL   MCH 27.4  26.0 - 34.0 pg   MCHC 33.8  30.0 - 36.0 g/dL   RDW 14.4  11.5 - 15.5 %   Platelets 115 (*) 150 - 400 K/uL  COMPREHENSIVE METABOLIC PANEL      Result Value Ref Range   Sodium 143  137 - 147 mEq/L   Potassium 4.0  3.7 - 5.3 mEq/L   Chloride 104  96 - 112 mEq/L   CO2 27  19 - 32 mEq/L   Glucose, Bld 75  70 - 99 mg/dL   BUN 10  6 - 23 mg/dL   Creatinine, Ser 1.14  0.50 - 1.35 mg/dL   Calcium 9.2  8.4 - 10.5 mg/dL   Total Protein 7.3  6.0 - 8.3 g/dL   Albumin 3.5  3.5 - 5.2 g/dL   AST 16  0 - 37 U/L   ALT 18  0 - 53 U/L   Alkaline Phosphatase 72  39 - 117 U/L   Total Bilirubin <0.2 (*) 0.3 - 1.2 mg/dL   GFR calc non Af Amer 70 (*) >90 mL/min   GFR calc Af Amer 81 (*) >90 mL/min  TROPONIN I      Result Value Ref Range   Troponin I <0.30  <0.30 ng/mL   Dg Chest 2 View  02/04/2014   CLINICAL DATA:  Chest and left arm pain.  EXAM: CHEST  2 VIEW  COMPARISON:  None.  FINDINGS:  The lungs are clear. Heart size is normal. No pneumothorax or pleural effusion. No focal bony abnormality.  IMPRESSION: Negative chest.   Electronically Signed   By: Inge Rise M.D.   On: 02/04/2014 10:53        EKG Interpretation   Date/Time:  Monday February 04 2014 09:38:48 EDT Ventricular Rate:  75 PR Interval:  166 QRS Duration: 99 QT Interval:  393 QTC Calculation: 439 R Axis:   -99 Text Interpretation:  Sinus rhythm Right superior axis deviation Premature  ventricular complexes Nonspecific T wave abnormality No significant change  since last tracing Confirmed by STEINL  MD, Lennette Bihari ([id number]) on 02/04/2014  10:13:50 AM         MDM  Iv ns. Labs. Ecg. Cxr.  Reviewed nursing notes and prior charts for additional history.   After prolonged symptoms x days, trop neg.  Pt drove self. Motrin po.  Pt states had seen cardiology in [place] in past, requests referral - reviewed prior cath, no coronary disease, however cath was many yrs ago, will provide referral.   After prolonged pain, trop neg, cxr neg, pt appears stable for d/c.      Mirna Mires, MD 02/04/14 1142

## 2014-02-04 NOTE — ED Notes (Signed)
[facility]. Reports left side dull pain to chest and under his arm x 3 weeks, also had left arm numbness, went to [place] and everything was negative. Had return of pain one week ago, has cardiac history.

## 2014-02-04 NOTE — ED Provider Notes (Signed)
CSN: [id number]     Arrival date & time 02/04/14  0805 History   First MD Initiated Contact with Patient 02/04/14 0840     Chief Complaint  Patient presents with  . Chest Pain   (Consider location/radiation/quality/duration/timing/severity/associated sxs/prior Treatment) HPI Comments: 58 year old male with history of coronary artery disease and HIV presents with a complaint of chest pain and dyspnea on exertion. He was seen by his physician at the [facility] approximately 3 weeks ago with various muscle aches and pains and was treated with Flexeril. Today he was walking and developed shortness of breath associated with the left lateral anterior chest pain, pain in the left axilla, pain and numbness to the left arm.. He states that he had to stop walking due to the chest pain and shortness of breath. When she began walking again he developed the same symptoms. He also has tenderness in the left chest wall as well as pain and tenderness to the muscles of the lower back.  Patient is a 58 y.o. male presenting with chest pain.  Chest Pain Associated symptoms: back pain and shortness of breath   Associated symptoms: no cough, no fever, no nausea and not vomiting     Past Medical History  Diagnosis Date  . HIV (human immunodeficiency virus infection)   . Hypertension   . Coronary artery disease    History reviewed. No pertinent past surgical history. History reviewed. No pertinent family history. History  Substance Use Topics  . Smoking status: Never Smoker   . Smokeless tobacco: Not on file  . Alcohol Use: No    Review of Systems  Constitutional: Positive for activity change. Negative for fever.  HENT: Negative.   Respiratory: Positive for shortness of breath. Negative for apnea, cough and wheezing.   Cardiovascular: Positive for chest pain. Negative for leg swelling.  Gastrointestinal: Negative.  Negative for nausea and vomiting.  Genitourinary: Negative.   Musculoskeletal: Positive for back  pain and myalgias.  Neurological: Negative for syncope.    Allergies  Ivp dye  Home Medications   Prior to Admission medications   Medication Sig Start Date End Date Taking? Authorizing Kadance Mccuistion  cyclobenzaprine (FLEXERIL) 10 MG tablet Take 10 mg by mouth 3 (three) times daily as needed for muscle spasms.   Yes Historical Braidyn Peace, MD  aspirin 81 MG chewable tablet Chew 81 mg by mouth daily.    Historical Chin Wachter, MD  doxycycline (VIBRAMYCIN) 100 MG capsule Take 1 capsule (100 mg total) by mouth 2 (two) times daily. 11/05/13   Fransico Meadow, PA-C  emtricitabine-tenofovir (TRUVADA) 200-300 MG per tablet Take 1 tablet by mouth daily.    Historical Jonavan Vanhorn, MD  ipratropium (ATROVENT) 0.06 % nasal spray Place 2 sprays into both nostrils 4 (four) times daily. 10/05/13   Lahoma Rocker, PA  lisinopril (PRINIVIL,ZESTRIL) 20 MG tablet Take 20 mg by mouth daily.    Historical Jaslynne Dahan, MD  lopinavir-ritonavir (KALETRA) 200-50 MG per tablet Take 2 tablets by mouth 2 (two) times daily.    Historical Darold Miley, MD  loratadine (CLARITIN) 10 MG tablet Take 10 mg by mouth daily as needed for allergies.    Historical Ailee Pates, MD  metoprolol tartrate (LOPRESSOR) 25 MG tablet Take 25 mg by mouth 2 (two) times daily.    Historical Johnnie Goynes, MD  omeprazole (PRILOSEC) 20 MG capsule Take 20 mg by mouth daily.    Historical Jenna Ardoin, MD  pravastatin (PRAVACHOL) 80 MG tablet Take 80 mg by mouth daily.    Historical Torence Palmeri, MD  raltegravir (ISENTRESS) 400 MG tablet Take 400 mg by mouth 2 (two) times daily.    Historical Azariya Freeman, MD  risperiDONE (RISPERDAL) 2 MG tablet Take 1 mg by mouth 2 (two) times daily.     Historical Rilynn Habel, MD  traZODone (DESYREL) 100 MG tablet Take 100 mg by mouth at bedtime.    Historical Jamarria Real, MD   BP 123/86  Pulse 88  Temp(Src) 99 F (37.2 C) (Oral)  Resp 14  SpO2 100% Physical Exam  Nursing note and vitals reviewed. Constitutional: He is oriented to person, place,  and time. He appears well-developed and well-nourished. No distress.  Eyes: Conjunctivae and EOM are normal.  Neck: Normal range of motion. Neck supple.  Cardiovascular: Normal rate and normal heart sounds.   Frequent premature beats Per monitor PVC's  Pulmonary/Chest: Effort normal and breath sounds normal. No respiratory distress. He has no wheezes. He has no rales.  Neurological: He is alert and oriented to person, place, and time.  Skin: Skin is warm and dry.  Psychiatric: He has a normal mood and affect.    ED Course  Procedures (including critical care time) Labs Review Labs Reviewed - No data to display  Imaging Review No results found.  EKG: NSR, PVC's. Q waves inferior leads.  MDM   1. Chest pain, unspecified chest pain type   2. Dyspnea on exertion   3. History of coronary artery disease   4. Bilateral low back pain without sciatica     Transfer to [facility] ED for chest pain on exertion, DOE, hx of CAD and HIV.  Asa 324 mg po IV nS Monitor oxygen     Janne Napoleon, NP 02/04/14 4734  Janne Napoleon, NP 02/04/14 0370  Janne Napoleon, NP 02/04/14 [phone]

## 2014-02-04 NOTE — ED Notes (Signed)
Patient transported to X-ray 

## 2014-02-04 NOTE — ED Notes (Signed)
C/o chest pain  States the pain was lower back pain which has radiated to his shoulders States he has had left hand numbness Cyclobenzaprine was used as tx

## 2014-02-04 NOTE — Discharge Instructions (Signed)
Take motrin as need. You may also take ultram as need for pain - no driving when taking ultram. Follow up with primary care doctor later this week. For chest pain, also follow up with cardiologist in the next week - see referral - call to arrange appointment. Return to ER if worse, new symptoms, fevers, trouble breathing, persistent/recurrent chest pain, other concern.    Chest Pain (Nonspecific) It is often hard to give a specific diagnosis for the cause of chest pain. There is always a chance that your pain could be related to something serious, such as a heart attack or a blood clot in the lungs. You need to follow up with your health care provider for further evaluation. CAUSES   Heartburn.  Pneumonia or bronchitis.  Anxiety or stress.  Inflammation around your heart (pericarditis) or lung (pleuritis or pleurisy).  A blood clot in the lung.  A collapsed lung (pneumothorax). It can develop suddenly on its own (spontaneous pneumothorax) or from trauma to the chest.  Shingles infection (herpes zoster virus). The chest wall is composed of bones, muscles, and cartilage. Any of these can be the source of the pain.  The bones can be bruised by injury.  The muscles or cartilage can be strained by coughing or overwork.  The cartilage can be affected by inflammation and become sore (costochondritis). DIAGNOSIS  Lab tests or other studies may be needed to find the cause of your pain. Your health care provider may have you take a test called an ambulatory electrocardiogram (ECG). An ECG records your heartbeat patterns over a 24-hour period. You may also have other tests, such as:  Transthoracic echocardiogram (TTE). During echocardiography, sound waves are used to evaluate how blood flows through your heart.  Transesophageal echocardiogram (TEE).  Cardiac monitoring. This allows your health care provider to monitor your heart rate and rhythm in real time.  Holter monitor. This is a  portable device that records your heartbeat and can help diagnose heart arrhythmias. It allows your health care provider to track your heart activity for several days, if needed.  Stress tests by exercise or by giving medicine that makes the heart beat faster. TREATMENT   Treatment depends on what may be causing your chest pain. Treatment may include:  Acid blockers for heartburn.  Anti-inflammatory medicine.  Pain medicine for inflammatory conditions.  Antibiotics if an infection is present.  You may be advised to change lifestyle habits. This includes stopping smoking and avoiding alcohol, caffeine, and chocolate.  You may be advised to keep your head raised (elevated) when sleeping. This reduces the chance of acid going backward from your stomach into your esophagus. Most of the time, nonspecific chest pain will improve within 2-3 days with rest and mild pain medicine.  HOME CARE INSTRUCTIONS   If antibiotics were prescribed, take them as directed. Finish them even if you start to feel better.  For the next few days, avoid physical activities that bring on chest pain. Continue physical activities as directed.  Do not use any tobacco products, including cigarettes, chewing tobacco, or electronic cigarettes.  Avoid drinking alcohol.  Only take medicine as directed by your health care provider.  Follow your health care provider's suggestions for further testing if your chest pain does not go away.  Keep any follow-up appointments you made. If you do not go to an appointment, you could develop lasting (chronic) problems with pain. If there is any problem keeping an appointment, call to reschedule. SEEK MEDICAL CARE IF:  Your chest pain does not go away, even after treatment.  You have a rash with blisters on your chest.  You have a fever. SEEK IMMEDIATE MEDICAL CARE IF:   You have increased chest pain or pain that spreads to your arm, neck, jaw, back, or abdomen.  You  have shortness of breath.  You have an increasing cough, or you cough up blood.  You have severe back or abdominal pain.  You feel nauseous or vomit.  You have severe weakness.  You faint.  You have chills. This is an emergency. Do not wait to see if the pain will go away. Get medical help at once. Call your local emergency services (911 in [place] Do not drive yourself to the hospital. MAKE SURE YOU:   Understand these instructions.  Will watch your condition.  Will get help right away if you are not doing well or get worse. Document Released: 05/05/2005 Document Revised: 07/31/2013 Document Reviewed: 02/29/2008 [facility] Patient Information 2015 [place], [place]. This information is not intended to replace advice given to you by your health care provider. Make sure you discuss any questions you have with your health care provider.

## 2014-02-05 NOTE — ED Provider Notes (Signed)
Medical screening examination/treatment/procedure(s) were performed by non-physician practitioner and as supervising physician I was immediately available for consultation/collaboration.  Philipp Deputy, M.D.  Harden Mo, MD 02/05/14 [phone]

## 2014-02-20 ENCOUNTER — Encounter: Payer: Self-pay | Admitting: Cardiovascular Disease

## 2014-02-20 ENCOUNTER — Ambulatory Visit (INDEPENDENT_AMBULATORY_CARE_PROVIDER_SITE_OTHER): Payer: Medicare Other | Admitting: Cardiovascular Disease

## 2014-02-20 VITALS — BP 110/80 | HR 82 | Ht 69.5 in | Wt 186.8 lb

## 2014-02-20 DIAGNOSIS — R0609 Other forms of dyspnea: Secondary | ICD-10-CM | POA: Diagnosis not present

## 2014-02-20 DIAGNOSIS — R0989 Other specified symptoms and signs involving the circulatory and respiratory systems: Secondary | ICD-10-CM

## 2014-02-20 DIAGNOSIS — F3289 Other specified depressive episodes: Secondary | ICD-10-CM

## 2014-02-20 DIAGNOSIS — R0789 Other chest pain: Secondary | ICD-10-CM | POA: Diagnosis not present

## 2014-02-20 DIAGNOSIS — B20 Human immunodeficiency virus [HIV] disease: Secondary | ICD-10-CM | POA: Insufficient documentation

## 2014-02-20 DIAGNOSIS — E785 Hyperlipidemia, unspecified: Secondary | ICD-10-CM | POA: Insufficient documentation

## 2014-02-20 DIAGNOSIS — Z79899 Other long term (current) drug therapy: Secondary | ICD-10-CM

## 2014-02-20 DIAGNOSIS — I10 Essential (primary) hypertension: Secondary | ICD-10-CM | POA: Insufficient documentation

## 2014-02-20 DIAGNOSIS — R079 Chest pain, unspecified: Secondary | ICD-10-CM | POA: Diagnosis not present

## 2014-02-20 DIAGNOSIS — F32A Depression, unspecified: Secondary | ICD-10-CM

## 2014-02-20 DIAGNOSIS — F329 Major depressive disorder, single episode, unspecified: Secondary | ICD-10-CM | POA: Insufficient documentation

## 2014-02-20 DIAGNOSIS — R0600 Dyspnea, unspecified: Secondary | ICD-10-CM

## 2014-02-20 DIAGNOSIS — Z8673 Personal history of transient ischemic attack (TIA), and cerebral infarction without residual deficits: Secondary | ICD-10-CM | POA: Insufficient documentation

## 2014-02-20 MED ORDER — METOPROLOL TARTRATE 50 MG PO TABS: 50.0000 mg | ORAL_TABLET | Freq: Two times a day (BID) | ORAL | Status: DC

## 2014-02-20 NOTE — Patient Instructions (Signed)
Your physician has requested that you have an echocardiogram. Echocardiography is a painless test that uses sound waves to create images of your heart. It provides your doctor with information about the size and shape of your heart and how well your heart's chambers and valves are working. This procedure takes approximately one hour. There are no restrictions for this procedure.  Your physician has requested that you have a lexiscan myoview. For further information please visit [email or web address]. Please follow instruction sheet, as given.  Your physician recommends that you return for lab work FASTING  Your physician recommends that you schedule a follow-up appointment after all test are completed.

## 2014-02-20 NOTE — Progress Notes (Signed)
Patient ID: Richard [name], male   DOB: 19-Mar-1956, 58 y.o.   MRN: [id number]     PATIENT PROFILE: Richard [name] is a 58 y.o. male who is referred [name] emergency room for cardiology evaluation following his presentation with chest pain.   HPI:  Richard [name] is a 58 y.o. male who suffered a CVA in 2008.  He has been followed at the [facility] hospital.  3 weeks ago while walking he developed an episode of left-sided chest pain with left arm radiation.  2 weeks ago, he developed several days of nonexertional type chest pain, which prompted a call hospital emergency room evaluation.  During his hospitalization.  His white count was low at 3.5, hemoglobin 13.8, hematocrit 40.8, platelets 1:15.  Chemistry profile was normal.  Troponin was negative.  His ECG showed nonspecific T-wave changes.  Normally, he had undergone prior cardiac catheterization, which was commented by the ER notes, which did not reveal significant coronary artery disease, however, this was many years ago.  He now presents for cardiology evaluation.  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  He does have a history of hypertension, as well as hyperlipidemia.  There is also a history of depression, for which he takes Risperdal.  He denies definitive exertional chest pain, but does admit to increasing shortness of breath with activity.  He is unaware of palpitations.  He denies PND, orthopnea  He denies bleeding.  Past Medical History  Diagnosis Date  . HIV (human immunodeficiency virus infection)   . Hypertension   . Coronary artery disease   . Stroke     No past surgical history on file.  Allergies  Allergen Reactions  . Ivp Dye [Iodinated Diagnostic Agents] Hives    Current Outpatient Prescriptions  Medication Sig Dispense Refill  . aspirin 81 MG chewable tablet Chew 81 mg by mouth daily.      [name] ipratropium (ATROVENT) 0.06 % nasal spray  Place 2 sprays into both nostrils 4 (four) times daily.  15 mL  1  . lisinopril (PRINIVIL,ZESTRIL) 20 MG tablet Take 20 mg by mouth daily.      [name] lopinavir-ritonavir (KALETRA) 200-50 MG per tablet Take 2 tablets by mouth 2 (two) times daily.      [name] loratadine (CLARITIN) 10 MG tablet Take 10 mg by mouth daily as needed for allergies.      [name] omeprazole (PRILOSEC) 20 MG capsule Take 20 mg by mouth daily.      . pravastatin (PRAVACHOL) 80 MG tablet Take 80 mg by mouth daily.      . raltegravir (ISENTRESS) 400 MG tablet Take 400 mg by mouth 2 (two) times daily.      . risperiDONE (RISPERDAL) 2 MG tablet Take 1 mg by mouth 2 (two) times daily.       . traMADol (ULTRAM) 50 MG tablet Take 1 tablet (50 mg total) by mouth every 6 (six) hours as needed.  20 tablet  0  . traZODone (DESYREL) 100 MG tablet Take 100 mg by mouth at bedtime.      . metoprolol (LOPRESSOR) 50 MG tablet Take 1 tablet (50 mg total) by mouth 2 (two) times daily.  180 tablet  3   No current facility-administered medications for this visit.    Socially, he is married for 25 years.  Has one child.  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  He  completed 12th grade education.  There is no tobacco use.  He does drink alcohol.  Family History  Problem Relation Age of Onset  . Heart disease Mother     ROS General: Negative; No fevers, chills, or night sweats HEENT: Negative; No changes in vision or hearing, sinus congestion, difficulty swallowing Pulmonary: Negative; No cough, wheezing, shortness of breath, hemoptysis Cardiovascular:  See HPI; No chest pain, presyncope, syncope, palpitations, edema GI: Negative; No nausea, vomiting, diarrhea, or abdominal pain GU: Negative; No dysuria, hematuria, or difficulty voiding Musculoskeletal: Negative; no myalgias, joint pain, or weakness Hematologic/Oncologic: Positive for HIV well-controlled on chronic therapy for years; no easy bruising, bleeding Endocrine: Negative;  no heat/cold intolerance; no diabetes Neuro: Negative; no changes in balance, headaches Skin: Negative; No rashes or skin lesions Psychiatric: History of depression. No behavioral problems,  Sleep: Negative; No daytime sleepiness, hypersomnolence, bruxism, restless legs, hypnogagnic hallucinations Other comprehensive 14 point system review is negative   Physical Exam BP 110/80  Pulse 82  Ht 5' 9.5" (1.765 m)  Wt 186 lb 12.8 oz (84.732 kg)  BMI 27.20 kg/m2 General: Alert, oriented, no distress.  Skin: normal turgor, no rashes, warm and dry HEENT: Normocephalic, atraumatic. Pupils equal round and reactive to light; sclera anicteric; extraocular muscles intact; Fundi poorly visualized, but without hemorrhages or exudates. Nose without nasal septal hypertrophy Mouth/Parynx benign; Mallinpatti scale 2 Neck: No JVD, no carotid bruits; normal carotid upstroke Lungs: clear to ausculatation and percussion; no wheezing or rales Chest wall: without tenderness to palpitation Heart: PMI not displaced, RRR, s1 s2 normal, 1/6 systolic murmur, no diastolic murmur, no rubs, gallops, thrills, or heaves Abdomen: soft, nontender; no hepatosplenomehaly, BS+; abdominal aorta nontender and not dilated by palpation. Back: no CVA tenderness Pulses 2+ Musculoskeletal: full range of motion, normal strength, no joint deformities Extremities: no clubbing cyanosis or edema, Homan's sign negative  Neurologic: grossly nonfocal; Cranial nerves grossly wnl Psychologic: Normal mood and affect   ECG (independently read by me): Sinus rhythm with frequent PVCs with transient trigeminy, and evidence for VA conduction with his ectopy.  LABS:  BMET    Component Value Date/Time   NA 143 02/04/2014 1022   K 4.0 02/04/2014 1022   CL 104 02/04/2014 1022   CO2 27 02/04/2014 1022   GLUCOSE 75 02/04/2014 1022   BUN 10 02/04/2014 1022   CREATININE 1.14 02/04/2014 1022   CALCIUM 9.2 02/04/2014 1022   GFRNONAA 70* 02/04/2014  1022   GFRAA 81* 02/04/2014 1022     Hepatic Function Panel     Component Value Date/Time   PROT 7.3 02/04/2014 1022   ALBUMIN 3.5 02/04/2014 1022   AST 16 02/04/2014 1022   ALT 18 02/04/2014 1022   ALKPHOS 72 02/04/2014 1022   BILITOT <0.2* 02/04/2014 1022     CBC    Component Value Date/Time   WBC 3.5* 02/04/2014 1022   RBC 5.03 02/04/2014 1022   HGB 13.8 02/04/2014 1022   HCT 40.8 02/04/2014 1022   PLT 115* 02/04/2014 1022   MCV 81.1 02/04/2014 1022   MCH 27.4 02/04/2014 1022   MCHC 33.8 02/04/2014 1022   RDW 14.4 02/04/2014 1022   LYMPHSABS 1.3 11/01/2012 2112   MONOABS 0.3 11/01/2012 2112   EOSABS 0.1 11/01/2012 2112   BASOSABS 0.0 11/01/2012 2112     BNP    Component Value Date/Time   PROBNP 123.0* 04/03/2007 0605    Lipid Panel     Component Value Date/Time   CHOL  Value:  176        ATP III CLASSIFICATION:  <200     mg/dL   Desirable  200-239  mg/dL   Borderline High  >=240    mg/dL   High 04/03/2007 0605   TRIG 67 04/03/2007 0605   HDL 27* 04/03/2007 0605   CHOLHDL 6.5 04/03/2007 0605   VLDL 13 04/03/2007 0605   LDLCALC  Value: 136        Total Cholesterol/HDL:CHD Risk Coronary Heart Disease Risk Table                     Men   Women  1/2 Average Risk   3.4   3.3* 04/03/2007 0605      RADIOLOGY: Dg Chest 2 View  02/04/2014   CLINICAL DATA:  Chest and left arm pain.  EXAM: CHEST  2 VIEW  COMPARISON:  None.  FINDINGS: The lungs are clear. Heart size is normal. No pneumothorax or pleural effusion. No focal bony abnormality.  IMPRESSION: Negative chest.   Electronically Signed   By: Inge Rise M.D.   On: 02/04/2014 10:53     ASSESSMENT AND PLAN: [name] is a 58 year old, African American male, who reportedly suffered a CVA in 2008.  Remotely, he may have undergone prior cardiac catheterization, but I do not have the specifics of this.  He has noticed episode of left-sided chest pain with left arm radiation 3 weeks ago while walking.  Approximally, 2 weeks  ago, he developed several days of somewhat atypical chest pain, which was felt most likely to be musculoskeletal.  He does note shortness of breath with activity.  He does have ventricular ectopy on his ECG.  Presently, I'm recommending further titration of his metoprolol from 25 mg twice a day to 50 mg twice a day.  I'm scheduling him for a CBC, a fasting lipid panel, as well as TSH level.  Other laboratories were reviewed from his ER evaluation.  I scheduled him for a 2-D echo Doppler study will also schedule him for LexiScan Myoview study to assess potential ischemia.  I will  see him back in the office in followup of the above studies and further recommendations will be made at that time.   Troy Sine, MD, [facility] 02/20/2014 6:11 PM

## 2014-02-27 ENCOUNTER — Telehealth (HOSPITAL_COMMUNITY): Payer: Self-pay

## 2014-02-27 NOTE — Telephone Encounter (Signed)
Encounter complete. 

## 2014-03-01 ENCOUNTER — Ambulatory Visit (HOSPITAL_COMMUNITY)
Admission: RE | Admit: 2014-03-01 | Discharge: 2014-03-01 | Disposition: A | Discharge status: Home / Self Care | Payer: Medicare Other | Source: Ambulatory Visit | Attending: Cardiovascular Disease | Admitting: Cardiovascular Disease

## 2014-03-01 ENCOUNTER — Ambulatory Visit (HOSPITAL_BASED_OUTPATIENT_CLINIC_OR_DEPARTMENT_OTHER)
Admission: RE | Admit: 2014-03-01 | Discharge: 2014-03-01 | Disposition: A | Discharge status: Home / Self Care | Payer: Medicare Other | Source: Ambulatory Visit | Attending: Cardiovascular Disease | Admitting: Cardiovascular Disease

## 2014-03-01 DIAGNOSIS — R0602 Shortness of breath: Secondary | ICD-10-CM | POA: Diagnosis not present

## 2014-03-01 DIAGNOSIS — R079 Chest pain, unspecified: Secondary | ICD-10-CM

## 2014-03-01 DIAGNOSIS — R0989 Other specified symptoms and signs involving the circulatory and respiratory systems: Secondary | ICD-10-CM

## 2014-03-01 DIAGNOSIS — B20 Human immunodeficiency virus [HIV] disease: Secondary | ICD-10-CM | POA: Insufficient documentation

## 2014-03-01 DIAGNOSIS — Z87891 Personal history of nicotine dependence: Secondary | ICD-10-CM | POA: Diagnosis not present

## 2014-03-01 DIAGNOSIS — I10 Essential (primary) hypertension: Secondary | ICD-10-CM | POA: Insufficient documentation

## 2014-03-01 DIAGNOSIS — Z8673 Personal history of transient ischemic attack (TIA), and cerebral infarction without residual deficits: Secondary | ICD-10-CM | POA: Insufficient documentation

## 2014-03-01 DIAGNOSIS — Z8249 Family history of ischemic heart disease and other diseases of the circulatory system: Secondary | ICD-10-CM | POA: Insufficient documentation

## 2014-03-01 DIAGNOSIS — R0609 Other forms of dyspnea: Secondary | ICD-10-CM | POA: Insufficient documentation

## 2014-03-01 DIAGNOSIS — I2510 Atherosclerotic heart disease of native coronary artery without angina pectoris: Secondary | ICD-10-CM | POA: Insufficient documentation

## 2014-03-01 DIAGNOSIS — Z79899 Other long term (current) drug therapy: Secondary | ICD-10-CM | POA: Diagnosis not present

## 2014-03-01 DIAGNOSIS — R0600 Dyspnea, unspecified: Secondary | ICD-10-CM

## 2014-03-01 DIAGNOSIS — I059 Rheumatic mitral valve disease, unspecified: Secondary | ICD-10-CM | POA: Diagnosis not present

## 2014-03-01 LAB — LIPID PANEL
CHOL/HDL RATIO: 5.7 ratio
Cholesterol: 222 mg/dL — ABNORMAL HIGH (ref 0–200)
HDL: 39 mg/dL — AB (ref >39)
LDL Cholesterol: 155 mg/dL — ABNORMAL HIGH (ref 0–99)
Triglycerides: 142 mg/dL (ref <150)
VLDL: 28 mg/dL (ref 0–40)

## 2014-03-01 LAB — CBC
HCT: 42.9 % (ref 39.0–52.0)
HEMOGLOBIN: 15 g/dL (ref 13.0–17.0)
MCH: 27.2 pg (ref 26.0–34.0)
MCHC: 35 g/dL (ref 30.0–36.0)
MCV: 77.9 fL — AB (ref 78.0–100.0)
PLATELETS: 171 10*3/uL (ref 150–400)
RBC: 5.51 MIL/uL (ref 4.22–5.81)
RDW: 15.2 % (ref 11.5–15.5)
WBC: 4.3 10*3/uL (ref 4.0–10.5)

## 2014-03-01 LAB — TSH: TSH: 0.769 u[IU]/mL (ref 0.350–4.500)

## 2014-03-01 MED ORDER — AMINOPHYLLINE 25 MG/ML IV SOLN
75.0000 mg | Freq: Once | INTRAVENOUS | Status: AC
  Administered 2014-03-01: 75 mg via INTRAVENOUS

## 2014-03-01 MED ORDER — TECHNETIUM TC 99M SESTAMIBI GENERIC - CARDIOLITE
31.4000 | Freq: Once | INTRAVENOUS | Status: AC | PRN
  Administered 2014-03-01: 31 via INTRAVENOUS

## 2014-03-01 MED ORDER — TECHNETIUM TC 99M SESTAMIBI GENERIC - CARDIOLITE
10.9000 | Freq: Once | INTRAVENOUS | Status: AC | PRN
  Administered 2014-03-01: 10.9 via INTRAVENOUS

## 2014-03-01 MED ORDER — REGADENOSON 0.4 MG/5ML IV SOLN
0.4000 mg | Freq: Once | INTRAVENOUS | Status: AC
  Administered 2014-03-01: 0.4 mg via INTRAVENOUS

## 2014-03-01 NOTE — Procedures (Addendum)
[place] [address] [place] [address] [phone]  Cardiology Nuclear Med Study  Dewitte Vannice Crystal is a 58 y.o. male     MRN : [id number]     DOB: 1956/06/10  Procedure Date: 03/01/2014  Nuclear Med Background Indication for Stress Test:  Evaluation for Ischemia and Post Hospital History:  CAD;HIV;Last NUC MPI on 09/29/2001-nonischemic-scar;EF=33% Cardiac Risk Factors: CVA, Family History - CAD, History of Smoking, Hypertension and Lipids  Symptoms:  Chest Pain, DOE and SOB   Nuclear Pre-Procedure Caffeine/Decaff Intake:  7:00pm NPO After: 5:00am   IV Site: R Hand  IV 0.9% NS with Angio Cath:  22g  Chest Size (in):  44"  IV Started by: Rolene Course, RN  Height: 5\' 10"  (1.778 m)  Cup Size: n/a  BMI:  Body mass index is 26.69 kg/(m^2). Weight:  186 lb (84.369 kg)   Tech Comments:  n/a    Nuclear Med Study 1 or 2 day study: 1 day  Stress Test Type:  Philip Provider:  Shelva Majestic, MD   Resting Radionuclide: Technetium 88m Sestamibi  Resting Radionuclide Dose: 10.9 mCi   Stress Radionuclide:  Technetium 29m Sestamibi  Stress Radionuclide Dose: 31.4 mCi           Stress Protocol Rest HR: 55 Stress HR: 71  Rest BP: 124/91 Stress BP:124/91  Exercise Time (min): n/a METS: n/a          Dose of Adenosine (mg):  n/a Dose of Lexiscan: 0.4 mg  Dose of Atropine (mg): n/a Dose of Dobutamine: n/a mcg/kg/min (at max HR)  Stress Test Technologist: Mellody Memos, CCT Nuclear Technologist: Imagene Riches, CNMT   Rest Procedure:  Myocardial perfusion imaging was performed at rest 45 minutes following the intravenous administration of Technetium 94m Sestamibi. Stress Procedure:  The patient received IV Lexiscan 0.4 mg over 15-seconds.  Technetium 9m Sestamibi injected IV at 30-seconds.  Patient experienced shortness of breath and was administered 75 mg of Aminophylline IV at 5 minutes. There were no  significant changes with Lexiscan.  Quantitative spect images were obtained after a 45 minute delay.  Transient Ischemic Dilatation (Normal <1.22):  1.1 QGS EDV:  210 ml QGS ESV:  153 ml LV Ejection Fraction: 27%     Rest ECG: NSR, occasional PVCs, old anterior MI, old inferior MI, lateral T wave inversion  Stress ECG: No significant change from baseline ECG  QPS Raw Data Images:  Marked cardiomegaly Stress Images:  severely reduced trace uptake in the entire anterior wall, apex and inferior wall and moderately reduced uptake in the lateral wall. There is relatively preserved perfusion in the septum. Rest Images:  Comparison with the stress images reveals no significant change. Subtraction (SDS):  No reversibility is appreciated. LV Wall Motion:  severe global hypokinesis and severely depressed left ventricular systolic function. EF 27%  Impression Exercise Capacity:  Lexiscan with no exercise. BP Response:  Normal blood pressure response. Clinical Symptoms:  No significant symptoms noted. ECG Impression:  No significant ECG changes with Lexiscan. Comparison with Prior Nuclear Study: No significant change in perfusion from previous study, EF is slightly lower (was 33%).   Overall Impression:  Intermediate risk stress nuclear study with extensive scar in multiple coronary territories and severely reduced systolic function, but without reversible defects.   Sanda Klein, MD  03/01/2014 1:32 PM

## 2014-03-01 NOTE — Progress Notes (Addendum)
2D Echocardiogram Complete.  03/01/2014   Deliah Boston, RDCS   Preliminary Technician Findings:  The EF is severely reduced, which sounds consistent with prior echos performed at the [facility] (information per patient, Images and report are not available, and he is currently on an Asprin regiment for blood thinning).  Mr. Depascale is stable and having no pain or numbness at the time of exam or upon leaving.

## 2014-03-07 ENCOUNTER — Emergency Department (INDEPENDENT_AMBULATORY_CARE_PROVIDER_SITE_OTHER)
Admission: EM | Admit: 2014-03-07 | Discharge: 2014-03-07 | Disposition: A | Discharge status: Home / Self Care | Payer: Medicare Other | Source: Home / Self Care | Attending: Family Medicine | Admitting: Family Medicine

## 2014-03-07 ENCOUNTER — Encounter (HOSPITAL_COMMUNITY): Payer: Self-pay | Admitting: Emergency Medicine

## 2014-03-07 DIAGNOSIS — L259 Unspecified contact dermatitis, unspecified cause: Secondary | ICD-10-CM

## 2014-03-07 DIAGNOSIS — Z87448 Personal history of other diseases of urinary system: Secondary | ICD-10-CM

## 2014-03-07 DIAGNOSIS — Z87898 Personal history of other specified conditions: Secondary | ICD-10-CM

## 2014-03-07 LAB — POCT URINALYSIS DIP (DEVICE)
Bilirubin Urine: NEGATIVE
Glucose, UA: NEGATIVE mg/dL
Hgb urine dipstick: NEGATIVE
Ketones, ur: NEGATIVE mg/dL
Leukocytes, UA: NEGATIVE
NITRITE: NEGATIVE
PROTEIN: NEGATIVE mg/dL
SPECIFIC GRAVITY, URINE: 1.02 (ref 1.005–1.030)
UROBILINOGEN UA: 4 mg/dL — AB (ref 0.0–1.0)
pH: 6.5 (ref 5.0–8.0)

## 2014-03-07 MED ORDER — BETAMETHASONE DIPROPIONATE AUG 0.05 % EX CREA: TOPICAL_CREAM | Freq: Two times a day (BID) | CUTANEOUS | Status: DC

## 2014-03-07 NOTE — Discharge Instructions (Signed)

## 2014-03-07 NOTE — ED Provider Notes (Signed)
CSN: [id number]     Arrival date & time 03/07/14  0801 History   None    Chief Complaint  Patient presents with  . Rash  . Dysuria   (Consider location/radiation/quality/duration/timing/severity/associated sxs/prior Treatment) HPI Comments: 58 year old male presents complaining of rash on his feet and right arm after coming into contact with some chemicals on his yard 3 days ago. First the tops of both of his feet broke out, and was extremely itchy, now he also has a similar rash on the right antecubital fossa. Topical Benadryl was used for treatment, without relief. Also he admits to some dysuria-urethral stinging after urination-one time 2 days ago but that has resolved. No fever, chills, NVD, flank pain, abdominal pain  Patient is a 58 y.o. male presenting with rash and dysuria.  Rash Dysuria    Past Medical History  Diagnosis Date  . HIV (human immunodeficiency virus infection)   . Hypertension   . Coronary artery disease   . Stroke    History reviewed. No pertinent past surgical history. Family History  Problem Relation Age of Onset  . Heart disease Mother    History  Substance Use Topics  . Smoking status: Never Smoker   . Smokeless tobacco: Not on file  . Alcohol Use: No    Review of Systems  Genitourinary: Positive for dysuria.  Skin: Positive for rash.  All other systems reviewed and are negative.   Allergies  Ivp dye  Home Medications   Prior to Admission medications   Medication Sig Start Date End Date Taking? Authorizing Provider  lisinopril (PRINIVIL,ZESTRIL) 20 MG tablet Take 20 mg by mouth daily.   Yes Historical Provider, MD  lopinavir-ritonavir (KALETRA) 200-50 MG per tablet Take 2 tablets by mouth 2 (two) times daily.   Yes Historical Provider, MD  loratadine (CLARITIN) 10 MG tablet Take 10 mg by mouth daily as needed for allergies.   Yes Historical Provider, MD  metoprolol (LOPRESSOR) 50 MG tablet Take 1 tablet (50 mg total) by mouth 2 (two)  times daily. 02/20/14  Yes Troy Sine, MD  omeprazole (PRILOSEC) 20 MG capsule Take 20 mg by mouth daily.   Yes Historical Provider, MD  pravastatin (PRAVACHOL) 80 MG tablet Take 80 mg by mouth daily.   Yes Historical Provider, MD  raltegravir (ISENTRESS) 400 MG tablet Take 400 mg by mouth 2 (two) times daily.   Yes Historical Provider, MD  risperiDONE (RISPERDAL) 2 MG tablet Take 1 mg by mouth 2 (two) times daily.    Yes Historical Provider, MD  aspirin 81 MG chewable tablet Chew 81 mg by mouth daily.    Historical Provider, MD  augmented betamethasone dipropionate (DIPROLENE AF) 0.05 % cream Apply topically 2 (two) times daily. 03/07/14   Freeman Caldron Morio Widen, PA-C  ipratropium (ATROVENT) 0.06 % nasal spray Place 2 sprays into both nostrils 4 (four) times daily. 10/05/13   Lahoma Rocker, PA  traMADol (ULTRAM) 50 MG tablet Take 1 tablet (50 mg total) by mouth every 6 (six) hours as needed. 02/04/14   Mirna Mires, MD  traZODone (DESYREL) 100 MG tablet Take 100 mg by mouth at bedtime.    Historical Provider, MD   BP 110/80  Pulse 66  Temp(Src) 98.3 F (36.8 C) (Oral)  Resp 66  SpO2 100% Physical Exam  Nursing note and vitals reviewed. Constitutional: He is oriented to person, place, and time. He appears well-developed and well-nourished. No distress.  HENT:  Head: Normocephalic.  Cardiovascular: Normal rate, regular  rhythm and normal heart sounds.   Pulmonary/Chest: Effort normal and breath sounds normal. No respiratory distress.  Abdominal: Soft. There is no tenderness.  Neurological: He is alert and oriented to person, place, and time. Coordination normal.  Skin: Skin is warm and dry. Rash (excoriated papules on the dorsum of both feet and in the antecubital fossa) noted. Rash is papular. He is not diaphoretic.  Psychiatric: He has a normal mood and affect. Judgment normal.    ED Course  Procedures (including critical care time) Labs Review Labs Reviewed  POCT URINALYSIS DIP  (DEVICE) - Abnormal; Notable for the following:    Urobilinogen, UA 4.0 (*)    All other components within normal limits    Imaging Review No results found.   MDM   1. Contact dermatitis   2. History of dysuria    Contact dermatitis, treated with steroid cream, by mouth Benadryl when necessary. Dysuria has resolved and urine is normal, may followup if this returns.   Meds ordered this encounter  Medications  . augmented betamethasone dipropionate (DIPROLENE AF) 0.05 % cream    Sig: Apply topically 2 (two) times daily.    Dispense:  50 g    Refill:  0    Order Specific Question:  Supervising Provider    Answer:  Lynne Leader, [place]       Liam Graham, PA-C 03/07/14 [phone]

## 2014-03-07 NOTE — ED Notes (Signed)
Pt c/o rash on right arm and bilateral foot x 1 week Believes it maybe from chemical spray on grass Also c/o dysuria that has resolved???  Reports he felt stinging when voiding urine 2 days ago Alert w/no signs of acute distress

## 2014-03-08 NOTE — ED Provider Notes (Signed)
Medical screening examination/treatment/procedure(s) were performed by a resident physician or non-physician practitioner and as the supervising physician I was immediately available for consultation/collaboration.  Lynne Leader, MD    Gregor Hams, MD 03/08/14 [phone]

## 2014-03-13 ENCOUNTER — Other Ambulatory Visit: Payer: Self-pay | Admitting: *Deleted

## 2014-03-13 MED ORDER — ATORVASTATIN CALCIUM 40 MG PO TABS: 40.0000 mg | ORAL_TABLET | Freq: Every day | ORAL | Status: DC

## 2014-03-22 ENCOUNTER — Other Ambulatory Visit: Payer: Self-pay | Admitting: *Deleted

## 2014-03-22 ENCOUNTER — Encounter: Payer: Self-pay | Admitting: Cardiovascular Disease

## 2014-03-22 ENCOUNTER — Ambulatory Visit (INDEPENDENT_AMBULATORY_CARE_PROVIDER_SITE_OTHER): Payer: Medicare Other | Admitting: Cardiovascular Disease

## 2014-03-22 ENCOUNTER — Telehealth: Payer: Self-pay | Admitting: *Deleted

## 2014-03-22 VITALS — BP 104/73 | HR 62 | Ht 69.5 in | Wt 188.1 lb

## 2014-03-22 DIAGNOSIS — I428 Other cardiomyopathies: Secondary | ICD-10-CM

## 2014-03-22 DIAGNOSIS — Z01818 Encounter for other preprocedural examination: Secondary | ICD-10-CM | POA: Diagnosis not present

## 2014-03-22 DIAGNOSIS — R0789 Other chest pain: Secondary | ICD-10-CM | POA: Diagnosis not present

## 2014-03-22 DIAGNOSIS — E785 Hyperlipidemia, unspecified: Secondary | ICD-10-CM

## 2014-03-22 DIAGNOSIS — I255 Ischemic cardiomyopathy: Secondary | ICD-10-CM | POA: Insufficient documentation

## 2014-03-22 DIAGNOSIS — B20 Human immunodeficiency virus [HIV] disease: Secondary | ICD-10-CM

## 2014-03-22 DIAGNOSIS — R079 Chest pain, unspecified: Secondary | ICD-10-CM

## 2014-03-22 DIAGNOSIS — I10 Essential (primary) hypertension: Secondary | ICD-10-CM | POA: Diagnosis not present

## 2014-03-22 DIAGNOSIS — Z8673 Personal history of transient ischemic attack (TIA), and cerebral infarction without residual deficits: Secondary | ICD-10-CM

## 2014-03-22 DIAGNOSIS — I429 Cardiomyopathy, unspecified: Secondary | ICD-10-CM

## 2014-03-22 MED ORDER — PREDNISONE 20 MG PO TABS: ORAL_TABLET | ORAL | Status: DC

## 2014-03-22 MED ORDER — ISOSORBIDE MONONITRATE ER 30 MG PO TB24: 30.0000 mg | ORAL_TABLET | Freq: Every day | ORAL | Status: DC

## 2014-03-22 NOTE — Patient Instructions (Addendum)
Your physician has requested that you have a cardiac catheterization. Cardiac catheterization is used to diagnose and/or treat various heart conditions. Doctors may recommend this procedure for a number of different reasons. The most common reason is to evaluate chest pain. Chest pain can be a symptom of coronary artery disease (CAD), and cardiac catheterization can show whether plaque is narrowing or blocking your heart's arteries. This procedure is also used to evaluate the valves, as well as measure the blood flow and oxygen levels in different parts of your heart. For further information please visit [email or web address]. Please follow instruction sheet, as given. This will be scheduled on Dr. Evette Georges next hospital cath day.  Your physician recommends that you return for lab work within 7 days of your catherization.  Your physician has recommended you make the following change in your medication: start new prescription for isosorbide [place]  30 mg. This has already been sent electronically to your pharmacy.  Your physician recommends that you schedule a follow-up appointment 2 weeks following your procedure. This appointment will be with an extender or nurse practioner  And will be scheduled at the time of the discharge.

## 2014-03-22 NOTE — Telephone Encounter (Signed)
Called patient to confirm dye allergy. Informed him that I will be sending a prednisone prescription to the pharmacy for him to take prior to his cath. He was instructed to take the medication as directed on the bottle. Patient voice verbal understanding of these instructions.

## 2014-03-22 NOTE — Progress Notes (Signed)
Patient ID: Richard [name], male   DOB: 10-01-1955, 58 y.o.   MRN: [id number]      HPI:  Richard [name] is a 58 y.o. male who I saw for initial cardiology evaluation one month ago in followup after he had presented to the emergency room with chest pain.   Richard [name] suffered a CVA in 2003.  He has been followed at the [facility] hospital.  3 weeks ago while walking he developed an episode of left-sided chest pain with left arm radiation.  2 weeks ago, he developed several days of nonexertional type chest pain, which prompted a call hospital emergency room evaluation.  During his hospitalization.  His white count was low at 3.5, hemoglobin 13.8, hematocrit 40.8, platelets 1:15.  Chemistry profile was normal.  Troponin was negative.  His ECG showed nonspecific T-wave changes.  In 2003 he had undergone  cardiac catheterization, which was commented by the ER notes, which did not reveal significant coronary artery disease, however, was never able to find this report.  Prior to catheterization he did have an ejection fraction of 33% with multiple areas of scar noted on a nuclear perfusion study.[name]  He now presents for cardiology evaluation.  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  He does have a history of hypertension, as well as hyperlipidemia.  There is also a history of depression, for which he takes Risperdal.  Recently, he has noticed some episodes of left-sided chest pain with walking, associated with left arm numbness appear but does admit to increasing shortness of breath with activity.  He is unaware of palpitations.  He denies PND, orthopnea  He denies bleeding.  He underwent a recent nuclear perfusion study on 03/01/2014.  Ejection fraction was about 27%.  This showed extensive scar multiple coronary territories without definitive reversible ischemia.  An echo Doppler study, which was done on 03/01/2014 showed an ejection  fraction of 20-25% with severe global hypokinesis, but more pronounced inferior hypokinesis.  He now presents for followup evaluation.  Past Medical History  Diagnosis Date  . HIV (human immunodeficiency virus infection)   . Hypertension   . Coronary artery disease   . Stroke     History reviewed. No pertinent past surgical history.  Allergies  Allergen Reactions  . Ivp Dye [Iodinated Diagnostic Agents] Hives    Current Outpatient Prescriptions  Medication Sig Dispense Refill  . aspirin 81 MG chewable tablet Chew 81 mg by mouth daily.      [name] atorvastatin (LIPITOR) 40 MG tablet Take 1 tablet (40 mg total) by mouth daily.  30 tablet  6  . augmented betamethasone dipropionate (DIPROLENE AF) 0.05 % cream Apply topically 2 (two) times daily.  50 g  0  . ipratropium (ATROVENT) 0.06 % nasal spray Place 2 sprays into both nostrils 4 (four) times daily.  15 mL  1  . lisinopril (PRINIVIL,ZESTRIL) 20 MG tablet Take 20 mg by mouth daily.      [name] lopinavir-ritonavir (KALETRA) 200-50 MG per tablet Take 2 tablets by mouth 2 (two) times daily.      [name] loratadine (CLARITIN) 10 MG tablet Take 10 mg by mouth daily as needed for allergies.      . metoprolol (LOPRESSOR) 50 MG tablet Take 1 tablet (50 mg total) by mouth 2 (two) times daily.  180 tablet  3  . omeprazole (PRILOSEC) 20 MG capsule Take 20 mg by mouth daily.      [name]  raltegravir (ISENTRESS) 400 MG tablet Take 400 mg by mouth 2 (two) times daily.      . risperiDONE (RISPERDAL) 2 MG tablet Take 1 mg by mouth 2 (two) times daily.       . traMADol (ULTRAM) 50 MG tablet Take 1 tablet (50 mg total) by mouth every 6 (six) hours as needed.  20 tablet  0  . traZODone (DESYREL) 100 MG tablet Take 100 mg by mouth at bedtime.       No current facility-administered medications for this visit.    Socially, he is married for 25 years.  Has one child.  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  He completed 12th grade education.  There is no  tobacco use.  He does drink alcohol.  Family History  Problem Relation Age of Onset  . Heart disease Mother     ROS General: Negative; No fevers, chills, or night sweats HEENT: Negative; No changes in vision or hearing, sinus congestion, difficulty swallowing Pulmonary: Negative; No cough, wheezing, shortness of breath, hemoptysis Cardiovascular:  See HPI; No chest pain, presyncope, syncope, palpitations, edema GI: Negative; No nausea, vomiting, diarrhea, or abdominal pain GU: Negative; No dysuria, hematuria, or difficulty voiding Musculoskeletal: Negative; no myalgias, joint pain, or weakness Hematologic/Oncologic: Positive for HIV well-controlled on chronic therapy for years; no easy bruising, bleeding Endocrine: Negative; no heat/cold intolerance; no diabetes Neuro: Negative; no changes in balance, headaches Skin: Negative; No rashes or skin lesions Psychiatric: History of depression. No behavioral problems,  Sleep: Negative; No daytime sleepiness, hypersomnolence, bruxism, restless legs, hypnogagnic hallucinations Other comprehensive 14 point system review is negative   Physical Exam BP 104/73  Pulse 62  Ht 5' 9.5" (1.765 m)  Wt 188 lb 1.6 oz (85.322 kg)  BMI 27.39 kg/m2 General: Alert, oriented, no distress.  Skin: normal turgor, no rashes, warm and dry HEENT: Normocephalic, atraumatic. Pupils equal round and reactive to light; sclera anicteric; extraocular muscles intact; Fundi poorly visualized, but without hemorrhages or exudates. Nose without nasal septal hypertrophy Mouth/Parynx benign; Mallinpatti scale 2 Neck: No JVD, no carotid bruits; normal carotid upstroke Lungs: clear to ausculatation and percussion; no wheezing or rales Chest wall: without tenderness to palpitation Heart: PMI not displaced, RRR, s1 s2 normal, 1/6 systolic murmur, no diastolic murmur, no rubs, gallops, thrills, or heaves Abdomen: soft, nontender; no hepatosplenomehaly, BS+; abdominal aorta  nontender and not dilated by palpation. Back: no CVA tenderness Pulses 2+ Musculoskeletal: full range of motion, normal strength, no joint deformities Extremities: no clubbing cyanosis or edema, Homan's sign negative  Neurologic: grossly nonfocal; Cranial nerves grossly wnl Psychologic: Normal mood and affect   Prior 02/20/2014 ECG (independently read by me): Sinus rhythm with frequent PVCs with transient trigeminy, and evidence for VA conduction with his ectopy.  LABS:  BMET    Component Value Date/Time   NA 143 02/04/2014 1022   K 4.0 02/04/2014 1022   CL 104 02/04/2014 1022   CO2 27 02/04/2014 1022   GLUCOSE 75 02/04/2014 1022   BUN 10 02/04/2014 1022   CREATININE 1.14 02/04/2014 1022   CALCIUM 9.2 02/04/2014 1022   GFRNONAA 70* 02/04/2014 1022   GFRAA 81* 02/04/2014 1022     Hepatic Function Panel     Component Value Date/Time   PROT 7.3 02/04/2014 1022   ALBUMIN 3.5 02/04/2014 1022   AST 16 02/04/2014 1022   ALT 18 02/04/2014 1022   ALKPHOS 72 02/04/2014 1022   BILITOT <0.2* 02/04/2014 1022  CBC    Component Value Date/Time   WBC 4.3 03/01/2014 0846   RBC 5.51 03/01/2014 0846   HGB 15.0 03/01/2014 0846   HCT 42.9 03/01/2014 0846   PLT 171 03/01/2014 0846   MCV 77.9* 03/01/2014 0846   MCH 27.2 03/01/2014 0846   MCHC 35.0 03/01/2014 0846   RDW 15.2 03/01/2014 0846   LYMPHSABS 1.3 11/01/2012 2112   MONOABS 0.3 11/01/2012 2112   EOSABS 0.1 11/01/2012 2112   BASOSABS 0.0 11/01/2012 2112     BNP    Component Value Date/Time   PROBNP 123.0* 04/03/2007 0605    Lipid Panel     Component Value Date/Time   CHOL 222* 03/01/2014 0846   TRIG 142 03/01/2014 0846   HDL 39* 03/01/2014 0846   CHOLHDL 5.7 03/01/2014 0846   VLDL 28 03/01/2014 0846   LDLCALC 155* 03/01/2014 0846      RADIOLOGY: Dg Chest 2 View  02/04/2014   CLINICAL DATA:  Chest and left arm pain.  EXAM: CHEST  2 VIEW  COMPARISON:  None.  FINDINGS: The lungs are clear. Heart size is normal. No pneumothorax or pleural  effusion. No focal bony abnormality.  IMPRESSION: Negative chest.   Electronically Signed   By: Inge Rise M.D.   On: 02/04/2014 10:53     ASSESSMENT AND PLAN: Richard [name] is a 58 year old, African American male who suffered a CVA in 2003.  Remotely, he may have undergone prior cardiac catheterization, but I do not have the specifics of this , but he was told that his coronaries were not blocked.  His most recent Myoview study.  Again suggests multiple areas of scar, without significant ischemia, but with decline in ejection fraction at 27%.  His most recent echo Doppler study shows an ejection fraction of 20-25%.  He has experienced recurrent episodes of some chest tightness with a pill walking, associated with left arm radiation.  This decline in LV function.  I have recommended definitive right and left heart cardiac catheterization.  Unlikely to add isosorbide mononitrate at 30 mg to see if this improves his symptomatology.  Further recommendations will be made upon completion of the catheterization study. Troy Sine, MD, [facility] 03/22/2014 12:30 PM

## 2014-03-25 ENCOUNTER — Encounter (HOSPITAL_COMMUNITY): Payer: Self-pay | Admitting: Pharmacy Technician

## 2014-04-02 ENCOUNTER — Encounter (HOSPITAL_COMMUNITY)
Admission: RE | Disposition: A | Discharge status: Home / Self Care | Payer: Self-pay | Source: Ambulatory Visit | Attending: Cardiovascular Disease

## 2014-04-02 ENCOUNTER — Inpatient Hospital Stay (HOSPITAL_COMMUNITY)
Admission: RE | Admit: 2014-04-02 | Discharge: 2014-04-04 | DRG: 246 | Disposition: A | Discharge status: Home / Self Care | Payer: Medicare Other | Source: Ambulatory Visit | Attending: Cardiovascular Disease | Admitting: Cardiovascular Disease

## 2014-04-02 ENCOUNTER — Encounter (HOSPITAL_COMMUNITY): Payer: Self-pay | Admitting: General Practice

## 2014-04-02 DIAGNOSIS — F329 Major depressive disorder, single episode, unspecified: Secondary | ICD-10-CM | POA: Diagnosis present

## 2014-04-02 DIAGNOSIS — B20 Human immunodeficiency virus [HIV] disease: Secondary | ICD-10-CM

## 2014-04-02 DIAGNOSIS — Z8673 Personal history of transient ischemic attack (TIA), and cerebral infarction without residual deficits: Secondary | ICD-10-CM | POA: Diagnosis not present

## 2014-04-02 DIAGNOSIS — E785 Hyperlipidemia, unspecified: Secondary | ICD-10-CM

## 2014-04-02 DIAGNOSIS — Z8249 Family history of ischemic heart disease and other diseases of the circulatory system: Secondary | ICD-10-CM | POA: Diagnosis not present

## 2014-04-02 DIAGNOSIS — Z91041 Radiographic dye allergy status: Secondary | ICD-10-CM

## 2014-04-02 DIAGNOSIS — Z7902 Long term (current) use of antithrombotics/antiplatelets: Secondary | ICD-10-CM | POA: Diagnosis not present

## 2014-04-02 DIAGNOSIS — R079 Chest pain, unspecified: Secondary | ICD-10-CM | POA: Diagnosis not present

## 2014-04-02 DIAGNOSIS — I255 Ischemic cardiomyopathy: Secondary | ICD-10-CM | POA: Diagnosis present

## 2014-04-02 DIAGNOSIS — IMO0002 Reserved for concepts with insufficient information to code with codable children: Secondary | ICD-10-CM

## 2014-04-02 DIAGNOSIS — I2582 Chronic total occlusion of coronary artery: Secondary | ICD-10-CM | POA: Diagnosis present

## 2014-04-02 DIAGNOSIS — I509 Heart failure, unspecified: Secondary | ICD-10-CM | POA: Diagnosis present

## 2014-04-02 DIAGNOSIS — I2589 Other forms of chronic ischemic heart disease: Secondary | ICD-10-CM | POA: Diagnosis present

## 2014-04-02 DIAGNOSIS — Z955 Presence of coronary angioplasty implant and graft: Secondary | ICD-10-CM

## 2014-04-02 DIAGNOSIS — I10 Essential (primary) hypertension: Secondary | ICD-10-CM

## 2014-04-02 DIAGNOSIS — I209 Angina pectoris, unspecified: Secondary | ICD-10-CM

## 2014-04-02 DIAGNOSIS — I5022 Chronic systolic (congestive) heart failure: Secondary | ICD-10-CM | POA: Diagnosis present

## 2014-04-02 DIAGNOSIS — I200 Unstable angina: Secondary | ICD-10-CM | POA: Diagnosis present

## 2014-04-02 DIAGNOSIS — Z79899 Other long term (current) drug therapy: Secondary | ICD-10-CM

## 2014-04-02 DIAGNOSIS — F3289 Other specified depressive episodes: Secondary | ICD-10-CM | POA: Diagnosis present

## 2014-04-02 DIAGNOSIS — I2510 Atherosclerotic heart disease of native coronary artery without angina pectoris: Secondary | ICD-10-CM | POA: Diagnosis not present

## 2014-04-02 DIAGNOSIS — I429 Cardiomyopathy, unspecified: Secondary | ICD-10-CM

## 2014-04-02 DIAGNOSIS — Z7982 Long term (current) use of aspirin: Secondary | ICD-10-CM

## 2014-04-02 DIAGNOSIS — Z01818 Encounter for other preprocedural examination: Secondary | ICD-10-CM

## 2014-04-02 DIAGNOSIS — Z21 Asymptomatic human immunodeficiency virus [HIV] infection status: Secondary | ICD-10-CM | POA: Diagnosis present

## 2014-04-02 DIAGNOSIS — I428 Other cardiomyopathies: Secondary | ICD-10-CM | POA: Diagnosis not present

## 2014-04-02 DIAGNOSIS — F32A Depression, unspecified: Secondary | ICD-10-CM | POA: Diagnosis present

## 2014-04-02 HISTORY — PX: LEFT AND RIGHT HEART CATHETERIZATION WITH CORONARY ANGIOGRAM: SHX5449

## 2014-04-02 HISTORY — PX: CARDIAC CATHETERIZATION: SHX172

## 2014-04-02 LAB — CBC
HEMATOCRIT: 38.7 % — AB (ref 39.0–52.0)
HEMOGLOBIN: 13.3 g/dL (ref 13.0–17.0)
MCH: 27 pg (ref 26.0–34.0)
MCHC: 34.4 g/dL (ref 30.0–36.0)
MCV: 78.7 fL (ref 78.0–100.0)
Platelets: 163 10*3/uL (ref 150–400)
RBC: 4.92 MIL/uL (ref 4.22–5.81)
RDW: 14.4 % (ref 11.5–15.5)
WBC: 3.8 10*3/uL — AB (ref 4.0–10.5)

## 2014-04-02 LAB — POCT I-STAT 3, ART BLOOD GAS (G3+)
Acid-base deficit: 4 mmol/L — ABNORMAL HIGH (ref 0.0–2.0)
Bicarbonate: 20.3 mEq/L (ref 20.0–24.0)
O2 SAT: 96 %
PCO2 ART: 32.4 mmHg — AB (ref 35.0–45.0)
TCO2: 21 mmol/L (ref 0–100)
pH, Arterial: 7.406 (ref 7.350–7.450)
pO2, Arterial: 79 mmHg — ABNORMAL LOW (ref 80.0–100.0)

## 2014-04-02 LAB — POCT I-STAT 3, VENOUS BLOOD GAS (G3P V)
ACID-BASE DEFICIT: 3 mmol/L — AB (ref 0.0–2.0)
Bicarbonate: 22.6 mEq/L (ref 20.0–24.0)
O2 SAT: 65 %
TCO2: 24 mmol/L (ref 0–100)
pCO2, Ven: 40.9 mmHg — ABNORMAL LOW (ref 45.0–50.0)
pH, Ven: 7.352 — ABNORMAL HIGH (ref 7.250–7.300)
pO2, Ven: 35 mmHg (ref 30.0–45.0)

## 2014-04-02 LAB — BASIC METABOLIC PANEL
Anion gap: 11 (ref 5–15)
BUN: 11 mg/dL (ref 6–23)
CHLORIDE: 105 meq/L (ref 96–112)
CO2: 23 meq/L (ref 19–32)
Calcium: 9.2 mg/dL (ref 8.4–10.5)
Creatinine, Ser: 1.08 mg/dL (ref 0.50–1.35)
GFR calc Af Amer: 87 mL/min — ABNORMAL LOW (ref >90)
GFR calc non Af Amer: 75 mL/min — ABNORMAL LOW (ref >90)
GLUCOSE: 142 mg/dL — AB (ref 70–99)
POTASSIUM: 4.2 meq/L (ref 3.7–5.3)
SODIUM: 139 meq/L (ref 137–147)

## 2014-04-02 LAB — PROTIME-INR
INR: 1.04 (ref 0.00–1.49)
Prothrombin Time: 13.6 seconds (ref 11.6–15.2)

## 2014-04-02 SURGERY — LEFT AND RIGHT HEART CATHETERIZATION WITH CORONARY ANGIOGRAM
Anesthesia: LOCAL

## 2014-04-02 MED ORDER — NITROGLYCERIN 1 MG/10 ML FOR IR/CATH LAB
INTRA_ARTERIAL | Status: AC
  Filled 2014-04-02: qty 10

## 2014-04-02 MED ORDER — DIPHENHYDRAMINE HCL 50 MG/ML IJ SOLN
INTRAMUSCULAR | Status: AC
  Filled 2014-04-02: qty 1

## 2014-04-02 MED ORDER — LISINOPRIL 20 MG PO TABS
20.0000 mg | ORAL_TABLET | Freq: Every day | ORAL | Status: DC
  Administered 2014-04-03 – 2014-04-04 (×2): 20 mg via ORAL
  Filled 2014-04-02 (×2): qty 1

## 2014-04-02 MED ORDER — IPRATROPIUM BROMIDE 0.06 % NA SOLN
2.0000 | Freq: Four times a day (QID) | NASAL | Status: DC
  Administered 2014-04-04: 2 via NASAL
  Filled 2014-04-02: qty 15

## 2014-04-02 MED ORDER — PANTOPRAZOLE SODIUM 40 MG PO TBEC
40.0000 mg | DELAYED_RELEASE_TABLET | Freq: Every day | ORAL | Status: DC
  Administered 2014-04-02 – 2014-04-04 (×3): 40 mg via ORAL
  Filled 2014-04-02 (×4): qty 1

## 2014-04-02 MED ORDER — ISOSORBIDE MONONITRATE ER 30 MG PO TB24
30.0000 mg | ORAL_TABLET | Freq: Every day | ORAL | Status: DC
  Administered 2014-04-03 – 2014-04-04 (×2): 30 mg via ORAL
  Filled 2014-04-02 (×2): qty 1

## 2014-04-02 MED ORDER — SODIUM CHLORIDE 0.9 % IJ SOLN: 3.0000 mL | INTRAMUSCULAR | Status: DC | PRN

## 2014-04-02 MED ORDER — ASPIRIN 81 MG PO CHEW
81.0000 mg | CHEWABLE_TABLET | Freq: Every day | ORAL | Status: DC
  Administered 2014-04-03 – 2014-04-04 (×2): 81 mg via ORAL
  Filled 2014-04-02 (×2): qty 1

## 2014-04-02 MED ORDER — MIDAZOLAM HCL 2 MG/2ML IJ SOLN
INTRAMUSCULAR | Status: AC
  Filled 2014-04-02: qty 2

## 2014-04-02 MED ORDER — ATORVASTATIN CALCIUM 40 MG PO TABS
40.0000 mg | ORAL_TABLET | Freq: Every day | ORAL | Status: DC
  Administered 2014-04-02 – 2014-04-04 (×3): 40 mg via ORAL
  Filled 2014-04-02 (×3): qty 1

## 2014-04-02 MED ORDER — SODIUM CHLORIDE 0.9 % IV SOLN
INTRAVENOUS | Status: DC
  Administered 2014-04-02: 12:00:00 via INTRAVENOUS

## 2014-04-02 MED ORDER — SODIUM CHLORIDE 0.9 % IV SOLN: 250.0000 mL | INTRAVENOUS | Status: DC | PRN

## 2014-04-02 MED ORDER — FENTANYL CITRATE 0.05 MG/ML IJ SOLN
INTRAMUSCULAR | Status: AC
  Filled 2014-04-02: qty 2

## 2014-04-02 MED ORDER — ASPIRIN 81 MG PO CHEW
CHEWABLE_TABLET | ORAL | Status: AC
  Filled 2014-04-02: qty 1

## 2014-04-02 MED ORDER — TRAMADOL HCL 50 MG PO TABS: 50.0000 mg | ORAL_TABLET | Freq: Four times a day (QID) | ORAL | Status: DC | PRN

## 2014-04-02 MED ORDER — METOPROLOL TARTRATE 50 MG PO TABS
50.0000 mg | ORAL_TABLET | Freq: Two times a day (BID) | ORAL | Status: DC
  Administered 2014-04-02 – 2014-04-04 (×4): 50 mg via ORAL
  Filled 2014-04-02 (×5): qty 1

## 2014-04-02 MED ORDER — DEXTROSE-NACL 5-0.45 % IV SOLN
INTRAVENOUS | Status: DC
  Administered 2014-04-02: 08:00:00 via INTRAVENOUS

## 2014-04-02 MED ORDER — DIPHENHYDRAMINE HCL 50 MG/ML IJ SOLN
25.0000 mg | INTRAMUSCULAR | Status: AC
  Administered 2014-04-02: 25 mg via INTRAVENOUS

## 2014-04-02 MED ORDER — DIAZEPAM 5 MG PO TABS
5.0000 mg | ORAL_TABLET | ORAL | Status: AC
  Administered 2014-04-02: 5 mg via ORAL

## 2014-04-02 MED ORDER — ASPIRIN 81 MG PO CHEW
81.0000 mg | CHEWABLE_TABLET | ORAL | Status: AC
  Administered 2014-04-02: 81 mg via ORAL

## 2014-04-02 MED ORDER — LIDOCAINE HCL (PF) 1 % IJ SOLN
INTRAMUSCULAR | Status: AC
Start: 2014-04-02 — End: 2014-04-02
  Filled 2014-04-02: qty 30

## 2014-04-02 MED ORDER — DIAZEPAM 5 MG PO TABS
ORAL_TABLET | ORAL | Status: AC
  Filled 2014-04-02: qty 1

## 2014-04-02 MED ORDER — HEPARIN (PORCINE) IN NACL 2-0.9 UNIT/ML-% IJ SOLN
INTRAMUSCULAR | Status: AC
  Filled 2014-04-02: qty 1000

## 2014-04-02 MED ORDER — SODIUM CHLORIDE 0.9 % IJ SOLN: 3.0000 mL | Freq: Two times a day (BID) | INTRAMUSCULAR | Status: DC

## 2014-04-02 MED ORDER — ACETAMINOPHEN 325 MG PO TABS
650.0000 mg | ORAL_TABLET | ORAL | Status: DC | PRN
Start: 2014-04-02 — End: 2014-04-04

## 2014-04-02 MED ORDER — RISPERIDONE 1 MG PO TABS
1.0000 mg | ORAL_TABLET | Freq: Two times a day (BID) | ORAL | Status: DC
  Administered 2014-04-03 – 2014-04-04 (×3): 1 mg via ORAL
  Filled 2014-04-02 (×5): qty 1

## 2014-04-02 MED ORDER — LOPINAVIR-RITONAVIR 200-50 MG PO TABS
2.0000 | ORAL_TABLET | Freq: Two times a day (BID) | ORAL | Status: DC
  Administered 2014-04-02 – 2014-04-04 (×4): 2 via ORAL
  Filled 2014-04-02 (×5): qty 2

## 2014-04-02 MED ORDER — LORATADINE 10 MG PO TABS
10.0000 mg | ORAL_TABLET | Freq: Every day | ORAL | Status: DC | PRN
  Filled 2014-04-02: qty 1

## 2014-04-02 MED ORDER — FAMOTIDINE IN NACL 20-0.9 MG/50ML-% IV SOLN
INTRAVENOUS | Status: AC
  Administered 2014-04-02: 20 mg
  Filled 2014-04-02: qty 50

## 2014-04-02 MED ORDER — ONDANSETRON HCL 4 MG/2ML IJ SOLN: 4.0000 mg | Freq: Four times a day (QID) | INTRAMUSCULAR | Status: DC | PRN

## 2014-04-02 MED ORDER — FAMOTIDINE 20 MG PO TABS: 20.0000 mg | ORAL_TABLET | ORAL | Status: DC

## 2014-04-02 MED ORDER — RALTEGRAVIR POTASSIUM 400 MG PO TABS
400.0000 mg | ORAL_TABLET | Freq: Two times a day (BID) | ORAL | Status: DC
  Administered 2014-04-02 – 2014-04-04 (×4): 400 mg via ORAL
  Filled 2014-04-02 (×5): qty 1

## 2014-04-02 NOTE — Consult Note (Signed)
[address]       [place] [address]             [phone]        Richard [name] [place] Medical Record #[id number] Date of Birth: 09-13-1955  Referring: Dr Claiborne Billings Primary Care: Dr Concha Pyo  Chief Complaint:   Left arm numbness and shortness of breath with exertion  History of Present Illness:  Patient is a 58 year old male with known severe LV dysfunction dating back to at least 2003 by echocardiogram. In 2008 the patient presented with an acute embolic stroke affecting the left side of his body, he is disabled because of the stroke. He does drive and walks 63-87 minutes daily. In may of this year he noted increasing left arm discomfort and increased shortness of breath with exertion. Occasionally he has nocturnal dyspnea. He's had no documented history of myocardial infarction. Significant amount of his care over the years has been provided by the [facility]. He is not currently on Coumadin but has been in the past. Echocardiogram and Myoview stress test were performed in July, patient comes in today electively for right and left heart catheterization.    Current Activity/ Functional Status: Patient is independent with mobility/ambulation, transfers, ADL's, IADL's., patient is independent with driving   Zubrod Score: At the time of surgery this patient's most appropriate activity status/level should be described as: []     0    Normal activity, no symptoms []     1    Restricted in physical strenuous activity but ambulatory, able to do out light work [x]     2    Ambulatory and capable of self care, unable to do work activities, up and about                 more than 50%  Of the time                            []     3    Only limited self care, in bed greater than 50% of waking hours []     4    Completely disabled, no self care, confined to bed or chair []     5    Moribund  Past Medical History  Diagnosis Date  . HIV (human immunodeficiency virus  infection)   . Hypertension   . Coronary artery disease   . Stroke     No past surgical history except mouth bx  History  Smoking status  . Never Smoker   Smokeless tobacco  . Not on file    History  Alcohol Use No    History   Social History  . Marital Status: Married    Spouse Name: N/A    Number of Children: N/A  . Years of Education: N/A   Occupational History  .  disabled since 2008 , previously was truck driver    Social History Main Topics  . Smoking status: Never Smoker   . Smokeless tobacco: Not on file  . Alcohol Use: No  . Drug Use: No  . Sexual Activity: Not Currently      Allergies  Allergen Reactions  . Ivp Dye [Iodinated Diagnostic Agents] Hives    Current Facility-Administered Medications  Medication Dose Route Frequency Provider Last Rate Last Dose  . 0.9 %  sodium chloride infusion  250 mL Intravenous PRN Troy Sine, MD      .  0.9 %  sodium chloride infusion   Intravenous Continuous Troy Sine, MD      . aspirin 81 MG chewable tablet           . dextrose 5 %-0.45 % sodium chloride infusion   Intravenous Continuous Troy Sine, MD 75 mL/hr at 04/02/14 0757    . diazepam (VALIUM) 5 MG tablet           . diphenhydrAMINE (BENADRYL) 50 MG/ML injection           . famotidine (PEPCID) tablet 20 mg  20 mg Oral Pre-Cath Troy Sine, MD      . sodium chloride 0.9 % injection 3 mL  3 mL Intravenous Q12H Troy Sine, MD      . sodium chloride 0.9 % injection 3 mL  3 mL Intravenous PRN Troy Sine, MD        Prescriptions prior to admission  Medication Sig Dispense Refill  . aspirin 81 MG chewable tablet Chew 81 mg by mouth daily.      [name] atorvastatin (LIPITOR) 40 MG tablet Take 1 tablet (40 mg total) by mouth daily.  30 tablet  6  . isosorbide mononitrate (IMDUR) 30 MG 24 hr tablet Take 1 tablet (30 mg total) by mouth daily.  30 tablet  6  . lisinopril (PRINIVIL,ZESTRIL) 20 MG tablet Take 20 mg by mouth daily.      [name]  lopinavir-ritonavir (KALETRA) 200-50 MG per tablet Take 2 tablets by mouth 2 (two) times daily.      [name] loratadine (CLARITIN) 10 MG tablet Take 10 mg by mouth daily as needed for allergies.      . metoprolol (LOPRESSOR) 50 MG tablet Take 1 tablet (50 mg total) by mouth 2 (two) times daily.  180 tablet  3  . omeprazole (PRILOSEC) 20 MG capsule Take 20 mg by mouth daily as needed (for heartburn).       . predniSONE (DELTASONE) 20 MG tablet Take 3 tablets  At bedtime the night prior to procedure. Take 3 tablets the morning of the procedure.  6 tablet  0  . raltegravir (ISENTRESS) 400 MG tablet Take 400 mg by mouth 2 (two) times daily.      . risperiDONE (RISPERDAL) 2 MG tablet Take 1 mg by mouth 2 (two) times daily.       . traMADol (ULTRAM) 50 MG tablet Take 50 mg by mouth every 6 (six) hours as needed for moderate pain.      [name] ipratropium (ATROVENT) 0.06 % nasal spray Place 2 sprays into both nostrils 4 (four) times daily.  15 mL  1    Family History  Problem Relation Age of Onset  . Heart disease/ died at age 20 of stroke  Mother    patient's father's history is unknown, Patient has 3 sisters 2 brothers, with family history of hypertension  Review of Systems:     Cardiac Review of Systems: Y or N  Chest Pain [   y ]  Resting SOB [  n ] Exertional SOB  [ y ]  Orthopnea [ y ]   Pedal Edema [n   ]    Palpitations [ n ] Syncope  [ n ]   Presyncope [ n  ]  General Review of Systems: [Y] = yes [  ]=no Constitional: recent weight change [n  ]; anorexia [n  ]; fatigue Blue.Reese  ]; nausea [  ]; night sweats [n  ]; fever [  n ]; or chills [n  ]                                                               Dental: poor dentition[ n ]; Last Dentist visit:   Eye : blurred vision [n  ]; diplopia [   ]; vision changes [ n ];  Amaurosis fugax[n  ]; Resp: cough [n  ];  wheezing[n  ];  hemoptysis[ n ]; shortness of breath[  ]; paroxysmal nocturnal dyspnea[  ]; dyspnea on exertion[  ]; or orthopnea[  ];  GI:   gallstones[  ], vomiting[n  ];  dysphagia[  ]; melena[  ];  hematochezia Florencio.Farrier  ]; heartburn[ n ];   Hx of  Colonoscopy[ y ]; GU: kidney stones [  ]; hematuria[  ];   dysuria [  ];  nocturia[  ];  history of     obstruction [  ]; urinary frequency [  ]             Skin: rash, swelling[n  ];, hair loss[  ];  peripheral edema[ n ];  or itching[  ]; Musculosketetal: myalgias[  ];  joint swelling[n  ];  joint erythema[ n ];  joint pain[ n ];  back pain[ n ];  Heme/Lymph: bruising[  ];  bleeding[  ];  anemia[  ];  Neuro: TIA[n  ];  headaches[ n ];  stroke[ y ];  vertigo[  ];  seizures[n  ];   paresthesias[  ];  difficulty walking[y  ];  Psych:depression[y  ]; anxiety[y  ];  Endocrine: diabetes[ n ];  thyroid dysfunction[ n ];  Immunizations: Flu [ y ]; Pneumococcal[y  ];  Other:  Physical Exam: BP 109/79  Pulse 63  Temp(Src) 98 F (36.7 C) (Oral)  Resp 19  Ht 5' 9.5" (1.765 m)  Wt 189 lb (85.73 kg)  BMI 27.52 kg/m2  SpO2 99%  General appearance: alert, cooperative, no distress and Some mild difficulty with words and mild dysarthria Neurologic: left sided weakness Heart: regular rate and rhythm, S1, S2 normal, no murmur, click, rub or gallop Lungs: clear to auscultation bilaterally Abdomen: soft, non-tender; bowel sounds normal; no masses,  no organomegaly Extremities: extremities normal, atraumatic, no cyanosis or edema and Homans sign is negative, no sign of DVT Wound: Patient's right groin cath site is intact with dressing no hematoma No carotid bruits, palpable DP and PT pulses bilaterally, appears to have adequate vein for bypass and both ankles  Diagnostic Studies & Laboratory data:     Recent Radiology Findings:   No results found.    ECHO: 03/01/2014 Study Conclusions  - Left ventricle: The cavity size was normal. Wall thickness was normal. Systolic function was severely reduced. The estimated ejection fraction was in the range of 20% to 25%. Severe global hypokinesis with  more prounounced inferior hypokinesis. There is diastolic dysfunction with indeterminate LV filling pressure. - Mitral valve: Mildly thickened leaflets . Minimal leaflet tip late systolic prolapse. There was mild regurgitation. - Left atrium: LA Volume/BSA= 25.2 ml/m2. The atrium was normal in size. - Right ventricle: The cavity size was normal. Systolic function is reduced. Lateral annulus peak S velocity: 5.41 cm/s. - Right atrium: The atrium was normal in size. - Inferior vena cava: The vessel  was normal in size. The respirophasic diameter changes were in the normal range (= 50%), consistent with normal central venous pressure. - Pericardium, extracardiac: There was no pericardial effusion.  Impressions:  - Compared to a prior echo in 2008, there has been further decrease in LV function to 20-25% with predominantly global hypokinesis.  ECHO 03/2007 SUMMARY - Mild to moderate general hypokinesis. Hypertrophied anterior papillary muscle. The left ventricle was mildly dilated. Left ventricular ejection fraction was estimated , range being 30 % to 35 %. - There was small fibrocalcific plaque at the isthmus of the oarta, but rest of the thoracic aorta was normal without e/0 significant atherosclerotic changes. - The left atrium was mild to moderately dilated. The left atrial appendage function was normal (normal emptying velocity). The interatrial septum was normal. - The right ventricle was moderately dilated. Right ventricular systolic function was mild to moderately reduced. - The right atrium was mild to moderately dilated. - No evidence of right to left shunting. No intracardiac shunt was detected by contrast study with agitated saline or color doppler.  IMPRESSIONS - There was no echocardiographic evidence for a cardiac source of embolism. 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  ECHO - 2003: SUMMARY The left ventricle was dilated. Overall left ventricular systolic function was markedly decreased. Left ventricular ejection fraction was estimated to be 20 % , range being 15 % to 25 %.. There was severe diffuse left ventricular hypokinesis. The left atrium was dilated. The right ventricle was mildly dilated. Right ventricular systolic function was moderately reduced. The right atrium was mildly dilated.  IMPRESSIONS These features are consistent with dilated cardiomyopathy   Myoview: 02/2014 Rest Procedure: Myocardial perfusion imaging was performed at rest 45 minutes following the intravenous administration of Technetium 55m Sestamibi.  Stress Procedure: The patient received IV Lexiscan 0.4 mg over 15-seconds. Technetium 46m Sestamibi injected IV at 30-seconds. Patient experienced shortness of breath and was administered 75 mg of Aminophylline IV at 5 minutes. There were no significant changes with Lexiscan. Quantitative spect images were obtained after a 45 minute delay.  Transient Ischemic Dilatation (Normal <1.22): 1.1  QGS EDV: 210 ml  QGS ESV: 153 ml  LV Ejection Fraction: 27%  Rest ECG: NSR, occasional PVCs, old anterior MI, old inferior MI, lateral T wave inversion  Stress ECG: No significant change from baseline ECG  QPS  Raw Data Images: Marked cardiomegaly  Stress Images: severely reduced trace uptake in the entire anterior wall, apex and inferior wall and moderately reduced uptake in the lateral wall. There is relatively preserved perfusion in the septum.  Rest Images: Comparison with the stress images reveals no significant change.  Subtraction (SDS): No reversibility is appreciated.  LV Wall Motion: severe global hypokinesis and severely depressed left ventricular systolic function. EF 27%  Impression  Exercise Capacity: Lexiscan with no exercise.  BP Response: Normal blood pressure response.  Clinical  Symptoms: No significant symptoms noted.  ECG Impression: No significant ECG changes with Lexiscan.  Comparison with Prior Nuclear Study: No significant change in perfusion from previous study, EF is slightly lower (was 33%).  Overall Impression: Intermediate risk stress nuclear study with extensive scar in multiple coronary territories and severely reduced systolic function, but without reversible defects.   CATH:  HEMODYNAMICS:  RA: Mean 3; a 7 v 5  RV: 25/4  PA: 25/13  PC: 12 mean initially  LV: 103/17  AO: 103/70  Oxygen saturation in the aorta 96% and the pulmonary artery 65%  Cardiac output: 2.8 l/min (Thermo); 4.8 (Fick)  Cardiac index: 1.4 l/m/m2 2.4  ANGIOGRAPHY:  Left main: Angiographically normal vessel, which bifurcated into the LAD and left circumflex vessel.  LAD: The LAD gave rise to a proximal diagonal vessel. It was 80 and 90% segmental stenosis in the first diagonal vessel. After the takeoff of the first septal perforating artery, the LAD had diffuse narrowing of 80-90% and extended to the take off of the smaller second diagonal branch. There was a 95% apical LAD stenosis.  Left circumflex: Codominant vessel that gave rise to 2 proximal marginal vessels. The distal vessel supplied the PDA and it was 95% stenosis in this distal inferior LV). She. There was collateralization to the distal RCA via the left coronary injection.  Right coronary artery: This arose from the left cusp. An AR-2 catheter was necessary for selective engagement. This was totally occluded proximally. There is faint antegrade collateralization to several branches, as well as left to right distal collateralization.  Left ventriculography dilated left ventricle with an ejection fraction of 15%. There was severe global hypokinesis.        Recent Lab Findings: Lab Results  Component Value Date   WBC 3.8* 04/02/2014   HGB 13.3 04/02/2014   HCT 38.7* 04/02/2014   PLT 163 04/02/2014   GLUCOSE 142*  04/02/2014   CHOL 222* 03/01/2014   TRIG 142 03/01/2014   HDL 39* 03/01/2014   LDLCALC 155* 03/01/2014   ALT 18 02/04/2014   AST 16 02/04/2014   NA 139 04/02/2014   K 4.2 04/02/2014   CL 105 04/02/2014   CREATININE 1.08 04/02/2014   BUN 11 04/02/2014   CO2 23 04/02/2014   TSH 0.769 03/01/2014   INR 1.04 04/02/2014   HGBA1C  Value: 5.7 (NOTE)   The ADA recommends the following therapeutic goals for glycemic   control related to Hgb A1C measurement:   Goal of Therapy:   < 7.0% Hgb A1C   Action Suggested:  > 8.0% Hgb A1C   Ref:  Diabetes Care, 22, Suppl. 1, 1999 04/02/2007      Assessment / Plan:   #1 severe cardiomyopathy documented by echocardiogram since at least 2003-  #2  history of embolic stroke affecting left side with residual left leg weakness 2008-compensated #3  severe three-vessel coronary artery disease, the right vessel and distal circumflex are poor targets for bypass, the LAD appears to be a suitable target for bypass but in light of no evidence of reversible ischemia concerned at high risk coronary artery bypass grafting would not result in much improvement in symptoms or longevity. Before making a final decision about high risk coronary bypass I will discuss the case in detail with cardiology.   Grace Isaac MD      [address] [place] [address] Office [phone]   [place]  04/02/2014 1:24 PM

## 2014-04-02 NOTE — CV Procedure (Signed)
Richard [name] is a 58 y.o. male   [id number]  [id number] LOCATION:  FACILITY: [facility]  PHYSICIAN: Troy Sine, MD, [facility] October 25, 1955   DATE OF PROCEDURE:  04/02/2014      RIGHT AND LEFT HEART CARDIAC CATHETERIZATION   HISTORY:  Richard [name] is a 58 y.o. male a remote history of a CVA and a history of HIV, which he contracted in the early 1990s, which has been stable on medical therapy.  A recent nuclear perfusion study revealed an ejection fraction of 27% with extensive regions of scar and multiple coronary territories without definitive ischemia.  An echo Doppler study revealed an ejection fraction of 20-25%.  He now is referred for right and left heart cardiac catheterization to   PROCEDURE:  Right and left heart cardiac catheterization: Coronary angiography, Swan-Ganz catheterization with cardiac output determination by the thermodilution and Fick methods, left ventriculography.  The patient was brought to the second floor [facility] cath lab in the postabsorptive state. Versed 2 mg and fentanyl 50 mcg were administered for conscious sedation. The right groin was prepped and draped in sterile fashion and a 5 [place] arterial sheath and 7 French venous sheath were inserted without difficulty. A Swan-Ganz catheter was advanced into the venous sheath and pressures were obtained in the right atrium, right ventricle, pulmonary artery, and pulmonary capillary wedge position. Cardiac outputs were obtained by the thermodilution and assumed Fick methods. Oxygen saturation was obtained in the pulmonary artery and aorta. A pigtail catheter was inserted and simultaneous AO/PA pressures were recorded. The pigtail catheter was advanced into the left ventricle and simultaneous left ventricular and PCW pressures were recorded. Left ventriculography was performed in the RAO projection.  A left ventricle to aorta pullback was performed. The pigtail catheter was then removed and diagnostic  catheterization to delineate the coronary anatomy was performed utilizing 5 French Judkins 4 left and right diagnostic catheters. All catheters were removed and the patient. Hemostasis was obtained by direct manual pressure. The patient tolerated the procedure well and returned to his room in satisfactory condition.   HEMODYNAMICS:   RA:  Mean 3; a 7 v 5 RV: 25/4 PA: 25/13 PC: 12 mean initially LV: 103/17  AO: 103/70  Oxygen saturation in the aorta 96% and the pulmonary artery 65%  Cardiac output: 2.8 l/min (Thermo); 4.8 (Fick)  Cardiac index: 1.4 l/m/m2                2.4  ANGIOGRAPHY:   Left main: Angiographically normal vessel, which bifurcated into the LAD and left circumflex vessel.  LAD: The LAD gave rise to a proximal diagonal vessel.  It was 80 and 90% segmental stenosis in the first diagonal vessel.  After the takeoff of the first septal perforating artery, the LAD had diffuse narrowing of 80-90% and extended to the take off of the smaller second diagonal branch.  There was a 95% apical LAD stenosis.  Left circumflex: Codominant vessel that gave rise to 2 proximal marginal vessels.  The distal vessel supplied the PDA and it was 95% stenosis in this distal inferior LV).  She.  There was collateralization to the distal RCA via the left coronary injection.  Right coronary artery: This arose from the left cusp.  An AR-2 catheter was necessary for selective engagement.  This was totally occluded proximally.  There is faint antegrade collateralization to several branches, as well as left to right distal collateralization.  Left ventriculography dilated left ventricle with an ejection fraction  of 15%.  There was severe global hypokinesis.   IMPRESSION:  Severe cardiomyopathy with an ejection fraction of approximately 15%.  Multi-vessel coronary obstructive disease with 80 and 90% stenoses in the first diagonal branch of the LAD, 80-90% LAD stenosis after the first septal perforating  artery and 95% apical LAD stenosis; 95% distal inferior LV branch stenosis of the distal circumflex coronary artery; and total occlusion of the proximal RCA with left-to-right collaterals.  Normal right heart pressures without evidence for pulmonary hypertension.  RECOMMENDATION:  Richard [name] has severe LV dysfunction, which has deteriorated since 2003, when apparently by an ER report, his ejection fraction was 33%.  He does have significant multi-vessel CAD as described.  Surgical consultation will be obtained for consideration of possible CABG revascularization surgery.  Consideration for myocardial viability study may be necessary.  If he is turned down for CABG surgery, PCI to his LAD, diagonal, and distal circumflex should be considered.   Troy Sine, MD, [facility] 04/02/2014 2:20 PM

## 2014-04-02 NOTE — Interval H&P Note (Signed)
Cath Lab Visit (complete for each Cath Lab visit)  Clinical Evaluation Leading to the Procedure:   ACS: No.  Non-ACS:    Anginal Classification: CCS III  Anti-ischemic medical therapy: Maximal Therapy (2 or more classes of medications)  Non-Invasive Test Results: High-risk stress test findings: cardiac mortality >3%/year  Prior CABG: No previous CABG      History and Physical Interval Note:  04/02/2014 9:23 AM  Richard [name]  has presented today for surgery, with the diagnosis of cp  The various methods of treatment have been discussed with the patient and family. After consideration of risks, benefits and other options for treatment, the patient has consented to  Procedure(s): LEFT AND RIGHT HEART CATHETERIZATION WITH CORONARY ANGIOGRAM (N/A) as a surgical intervention .  The patient's history has been reviewed, patient examined, no change in status, stable for surgery.  I have reviewed the patient's chart and labs.  Questions were answered to the patient's satisfaction.     KELLY,THOMAS A

## 2014-04-02 NOTE — H&P (View-Only) (Signed)
Patient ID: Richard [name], male   DOB: 1956/03/06, 58 y.o.   MRN: [id number]      HPI:  Richard [name] is a 59 y.o. male who I saw for initial cardiology evaluation one month ago in followup after he had presented to the emergency room with chest pain.   [name] suffered a CVA in 2003.  He has been followed at the [facility] hospital.  3 weeks ago while walking he developed an episode of left-sided chest pain with left arm radiation.  2 weeks ago, he developed several days of nonexertional type chest pain, which prompted a call hospital emergency room evaluation.  During his hospitalization.  His white count was low at 3.5, hemoglobin 13.8, hematocrit 40.8, platelets 1:15.  Chemistry profile was normal.  Troponin was negative.  His ECG showed nonspecific T-wave changes.  In 2003 he had undergone  cardiac catheterization, which was commented by the ER notes, which did not reveal significant coronary artery disease, however, was never able to find this report.  Prior to catheterization he did have an ejection fraction of 33% with multiple areas of scar noted on a nuclear perfusion study.Richard [name]  He now presents for cardiology evaluation.  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  He does have a history of hypertension, as well as hyperlipidemia.  There is also a history of depression, for which he takes Risperdal.  Recently, he has noticed some episodes of left-sided chest pain with walking, associated with left arm numbness appear but does admit to increasing shortness of breath with activity.  He is unaware of palpitations.  He denies PND, orthopnea  He denies bleeding.  He underwent a recent nuclear perfusion study on 03/01/2014.  Ejection fraction was about 27%.  This showed extensive scar multiple coronary territories without definitive reversible ischemia.  An echo Doppler study, which was done on 03/01/2014 showed an ejection  fraction of 20-25% with severe global hypokinesis, but more pronounced inferior hypokinesis.  He now presents for followup evaluation.  Past Medical History  Diagnosis Date  . HIV (human immunodeficiency virus infection)   . Hypertension   . Coronary artery disease   . Stroke     History reviewed. No pertinent past surgical history.  Allergies  Allergen Reactions  . Ivp Dye [Iodinated Diagnostic Agents] Hives    Current Outpatient Prescriptions  Medication Sig Dispense Refill  . aspirin 81 MG chewable tablet Chew 81 mg by mouth daily.      Richard [name] atorvastatin (LIPITOR) 40 MG tablet Take 1 tablet (40 mg total) by mouth daily.  30 tablet  6  . augmented betamethasone dipropionate (DIPROLENE AF) 0.05 % cream Apply topically 2 (two) times daily.  50 g  0  . ipratropium (ATROVENT) 0.06 % nasal spray Place 2 sprays into both nostrils 4 (four) times daily.  15 mL  1  . lisinopril (PRINIVIL,ZESTRIL) 20 MG tablet Take 20 mg by mouth daily.      Richard [name] lopinavir-ritonavir (KALETRA) 200-50 MG per tablet Take 2 tablets by mouth 2 (two) times daily.      Richard [name] loratadine (CLARITIN) 10 MG tablet Take 10 mg by mouth daily as needed for allergies.      . metoprolol (LOPRESSOR) 50 MG tablet Take 1 tablet (50 mg total) by mouth 2 (two) times daily.  180 tablet  3  . omeprazole (PRILOSEC) 20 MG capsule Take 20 mg by mouth daily.      Richard [name]  raltegravir (ISENTRESS) 400 MG tablet Take 400 mg by mouth 2 (two) times daily.      . risperiDONE (RISPERDAL) 2 MG tablet Take 1 mg by mouth 2 (two) times daily.       . traMADol (ULTRAM) 50 MG tablet Take 1 tablet (50 mg total) by mouth every 6 (six) hours as needed.  20 tablet  0  . traZODone (DESYREL) 100 MG tablet Take 100 mg by mouth at bedtime.       No current facility-administered medications for this visit.    Socially, he is married for 25 years.  Has one child.  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  He completed 12th grade education.  There is no  tobacco use.  He does drink alcohol.  Family History  Problem Relation Age of Onset  . Heart disease Mother     ROS General: Negative; No fevers, chills, or night sweats HEENT: Negative; No changes in vision or hearing, sinus congestion, difficulty swallowing Pulmonary: Negative; No cough, wheezing, shortness of breath, hemoptysis Cardiovascular:  See HPI; No chest pain, presyncope, syncope, palpitations, edema GI: Negative; No nausea, vomiting, diarrhea, or abdominal pain GU: Negative; No dysuria, hematuria, or difficulty voiding Musculoskeletal: Negative; no myalgias, joint pain, or weakness Hematologic/Oncologic: Positive for HIV well-controlled on chronic therapy for years; no easy bruising, bleeding Endocrine: Negative; no heat/cold intolerance; no diabetes Neuro: Negative; no changes in balance, headaches Skin: Negative; No rashes or skin lesions Psychiatric: History of depression. No behavioral problems,  Sleep: Negative; No daytime sleepiness, hypersomnolence, bruxism, restless legs, hypnogagnic hallucinations Other comprehensive 14 point system review is negative   Physical Exam BP 104/73  Pulse 62  Ht 5' 9.5" (1.765 m)  Wt 188 lb 1.6 oz (85.322 kg)  BMI 27.39 kg/m2 General: Alert, oriented, no distress.  Skin: normal turgor, no rashes, warm and dry HEENT: Normocephalic, atraumatic. Pupils equal round and reactive to light; sclera anicteric; extraocular muscles intact; Fundi poorly visualized, but without hemorrhages or exudates. Nose without nasal septal hypertrophy Mouth/Parynx benign; Mallinpatti scale 2 Neck: No JVD, no carotid bruits; normal carotid upstroke Lungs: clear to ausculatation and percussion; no wheezing or rales Chest wall: without tenderness to palpitation Heart: PMI not displaced, RRR, s1 s2 normal, 1/6 systolic murmur, no diastolic murmur, no rubs, gallops, thrills, or heaves Abdomen: soft, nontender; no hepatosplenomehaly, BS+; abdominal aorta  nontender and not dilated by palpation. Back: no CVA tenderness Pulses 2+ Musculoskeletal: full range of motion, normal strength, no joint deformities Extremities: no clubbing cyanosis or edema, Homan's sign negative  Neurologic: grossly nonfocal; Cranial nerves grossly wnl Psychologic: Normal mood and affect   Prior 02/20/2014 ECG (independently read by me): Sinus rhythm with frequent PVCs with transient trigeminy, and evidence for VA conduction with his ectopy.  LABS:  BMET    Component Value Date/Time   NA 143 02/04/2014 1022   K 4.0 02/04/2014 1022   CL 104 02/04/2014 1022   CO2 27 02/04/2014 1022   GLUCOSE 75 02/04/2014 1022   BUN 10 02/04/2014 1022   CREATININE 1.14 02/04/2014 1022   CALCIUM 9.2 02/04/2014 1022   GFRNONAA 70* 02/04/2014 1022   GFRAA 81* 02/04/2014 1022     Hepatic Function Panel     Component Value Date/Time   PROT 7.3 02/04/2014 1022   ALBUMIN 3.5 02/04/2014 1022   AST 16 02/04/2014 1022   ALT 18 02/04/2014 1022   ALKPHOS 72 02/04/2014 1022   BILITOT <0.2* 02/04/2014 1022  CBC    Component Value Date/Time   WBC 4.3 03/01/2014 0846   RBC 5.51 03/01/2014 0846   HGB 15.0 03/01/2014 0846   HCT 42.9 03/01/2014 0846   PLT 171 03/01/2014 0846   MCV 77.9* 03/01/2014 0846   MCH 27.2 03/01/2014 0846   MCHC 35.0 03/01/2014 0846   RDW 15.2 03/01/2014 0846   LYMPHSABS 1.3 11/01/2012 2112   MONOABS 0.3 11/01/2012 2112   EOSABS 0.1 11/01/2012 2112   BASOSABS 0.0 11/01/2012 2112     BNP    Component Value Date/Time   PROBNP 123.0* 04/03/2007 0605    Lipid Panel     Component Value Date/Time   CHOL 222* 03/01/2014 0846   TRIG 142 03/01/2014 0846   HDL 39* 03/01/2014 0846   CHOLHDL 5.7 03/01/2014 0846   VLDL 28 03/01/2014 0846   LDLCALC 155* 03/01/2014 0846      RADIOLOGY: Dg Chest 2 View  02/04/2014   CLINICAL DATA:  Chest and left arm pain.  EXAM: CHEST  2 VIEW  COMPARISON:  None.  FINDINGS: The lungs are clear. Heart size is normal. No pneumothorax or pleural  effusion. No focal bony abnormality.  IMPRESSION: Negative chest.   Electronically Signed   By: Inge Rise M.D.   On: 02/04/2014 10:53     ASSESSMENT AND PLAN: Mr. Basilio Meadow is a 58 year old, African American male who suffered a CVA in 2003.  Remotely, he may have undergone prior cardiac catheterization, but I do not have the specifics of this , but he was told that his coronaries were not blocked.  His most recent Myoview study.  Again suggests multiple areas of scar, without significant ischemia, but with decline in ejection fraction at 27%.  His most recent echo Doppler study shows an ejection fraction of 20-25%.  He has experienced recurrent episodes of some chest tightness with a pill walking, associated with left arm radiation.  This decline in LV function.  I have recommended definitive right and left heart cardiac catheterization.  Unlikely to add isosorbide mononitrate at 30 mg to see if this improves his symptomatology.  Further recommendations will be made upon completion of the catheterization study. Troy Sine, MD, [facility] 03/22/2014 12:30 PM

## 2014-04-02 NOTE — Care Management Note (Addendum)
  Page 1 of 1   04/04/2014     2:24:28 PM CARE MANAGEMENT NOTE 04/04/2014  Patient:  Richard [name], Richard [name]   Account Number:  [id number]  Date Initiated:  04/02/2014  Documentation initiated by:  Zoe Goonan  Subjective/Objective Assessment:   cardiomyopathy     Action/Plan:   CM to follow for disposition needs   Anticipated DC Date:  04/03/2014   Anticipated DC Plan:  HOME/SELF CARE         Choice offered to / List presented to:             Status of service:  Completed, signed off Medicare Important Message given?  NA - LOS <3 / Initial given by admissions (If response is "NO", the following Medicare IM given date fields will be blank) Date Medicare IM given:   Medicare IM given by:   Date Additional Medicare IM given:   Additional Medicare IM given by:    Discharge Disposition:  HOME/SELF CARE  Per UR Regulation:  Reviewed for med. necessity/level of care/duration of stay  If discussed at [place] of Stay Meetings, dates discussed:    Comments:  OPIB order at 04/02/14 02:05 PM Med Advisor / Dr. Reynaldo Minium confirms IP status d/t intervention: PERCUTANEOUS CORONARY STENT INTERVENTION (PCI-S) ---04/03/2014 1141 by Mariann Laster--- OPIB order at 04/02/14 02:05 PM ; no review required

## 2014-04-02 NOTE — Progress Notes (Signed)
Order for sheath removal verified per post procedural orders. Procedure explained to patient and Rt femoral artery and right venous access site assessed: level 0, palpable dorsalis pedis and posterior tibial pulses +1.  5French Sheath in artery and 7   french sheath in vein  removed and manual pressure applied for 20  minutes. Pre, peri, & post procedural vitals: HR 60, RR 26, O2 Sat upper 98% RA, BP 123/92, Pain 0. Distal pulses remained intact after sheath removal. Access site level 0 and dressed with 4X4 gauze and tegaderm.  , RN confirmed condition of site. Post procedural instructions discussed with return demonstration from patient.

## 2014-04-03 ENCOUNTER — Encounter (HOSPITAL_COMMUNITY)
Admission: RE | Disposition: A | Discharge status: Home / Self Care | Payer: Medicare Other | Source: Ambulatory Visit | Attending: Cardiovascular Disease

## 2014-04-03 DIAGNOSIS — Z91041 Radiographic dye allergy status: Secondary | ICD-10-CM | POA: Diagnosis not present

## 2014-04-03 DIAGNOSIS — I200 Unstable angina: Secondary | ICD-10-CM | POA: Diagnosis present

## 2014-04-03 DIAGNOSIS — E785 Hyperlipidemia, unspecified: Secondary | ICD-10-CM | POA: Diagnosis not present

## 2014-04-03 DIAGNOSIS — IMO0002 Reserved for concepts with insufficient information to code with codable children: Secondary | ICD-10-CM | POA: Diagnosis not present

## 2014-04-03 DIAGNOSIS — R079 Chest pain, unspecified: Secondary | ICD-10-CM | POA: Diagnosis present

## 2014-04-03 DIAGNOSIS — I10 Essential (primary) hypertension: Secondary | ICD-10-CM | POA: Diagnosis present

## 2014-04-03 DIAGNOSIS — I5022 Chronic systolic (congestive) heart failure: Secondary | ICD-10-CM | POA: Diagnosis not present

## 2014-04-03 DIAGNOSIS — B20 Human immunodeficiency virus [HIV] disease: Secondary | ICD-10-CM | POA: Diagnosis not present

## 2014-04-03 DIAGNOSIS — I2510 Atherosclerotic heart disease of native coronary artery without angina pectoris: Secondary | ICD-10-CM | POA: Diagnosis not present

## 2014-04-03 DIAGNOSIS — Z7982 Long term (current) use of aspirin: Secondary | ICD-10-CM | POA: Diagnosis not present

## 2014-04-03 DIAGNOSIS — Z7902 Long term (current) use of antithrombotics/antiplatelets: Secondary | ICD-10-CM | POA: Diagnosis not present

## 2014-04-03 DIAGNOSIS — Z79899 Other long term (current) drug therapy: Secondary | ICD-10-CM | POA: Diagnosis not present

## 2014-04-03 DIAGNOSIS — Z8673 Personal history of transient ischemic attack (TIA), and cerebral infarction without residual deficits: Secondary | ICD-10-CM | POA: Diagnosis not present

## 2014-04-03 DIAGNOSIS — F3289 Other specified depressive episodes: Secondary | ICD-10-CM | POA: Diagnosis present

## 2014-04-03 DIAGNOSIS — I428 Other cardiomyopathies: Secondary | ICD-10-CM | POA: Diagnosis not present

## 2014-04-03 DIAGNOSIS — Z8249 Family history of ischemic heart disease and other diseases of the circulatory system: Secondary | ICD-10-CM | POA: Diagnosis not present

## 2014-04-03 DIAGNOSIS — I509 Heart failure, unspecified: Secondary | ICD-10-CM | POA: Diagnosis present

## 2014-04-03 DIAGNOSIS — I2582 Chronic total occlusion of coronary artery: Secondary | ICD-10-CM | POA: Diagnosis not present

## 2014-04-03 DIAGNOSIS — I2589 Other forms of chronic ischemic heart disease: Secondary | ICD-10-CM | POA: Diagnosis present

## 2014-04-03 DIAGNOSIS — F329 Major depressive disorder, single episode, unspecified: Secondary | ICD-10-CM | POA: Diagnosis present

## 2014-04-03 HISTORY — PX: PERCUTANEOUS CORONARY STENT INTERVENTION (PCI-S): SHX5485

## 2014-04-03 LAB — BASIC METABOLIC PANEL
ANION GAP: 13 (ref 5–15)
BUN: 13 mg/dL (ref 6–23)
CO2: 25 meq/L (ref 19–32)
CREATININE: 1.16 mg/dL (ref 0.50–1.35)
Calcium: 9.1 mg/dL (ref 8.4–10.5)
Chloride: 105 mEq/L (ref 96–112)
GFR calc non Af Amer: 69 mL/min — ABNORMAL LOW (ref >90)
GFR, EST AFRICAN AMERICAN: 80 mL/min — AB (ref >90)
Glucose, Bld: 83 mg/dL (ref 70–99)
Potassium: 3.9 mEq/L (ref 3.7–5.3)
Sodium: 143 mEq/L (ref 137–147)

## 2014-04-03 LAB — CBC
HEMATOCRIT: 36.5 % — AB (ref 39.0–52.0)
Hemoglobin: 13 g/dL (ref 13.0–17.0)
MCH: 27.6 pg (ref 26.0–34.0)
MCHC: 35.6 g/dL (ref 30.0–36.0)
MCV: 77.5 fL — AB (ref 78.0–100.0)
PLATELETS: 164 10*3/uL (ref 150–400)
RBC: 4.71 MIL/uL (ref 4.22–5.81)
RDW: 14.4 % (ref 11.5–15.5)
WBC: 8.5 10*3/uL (ref 4.0–10.5)

## 2014-04-03 LAB — POCT ACTIVATED CLOTTING TIME: ACTIVATED CLOTTING TIME: 608 s

## 2014-04-03 SURGERY — PERCUTANEOUS CORONARY STENT INTERVENTION (PCI-S)
Anesthesia: LOCAL

## 2014-04-03 MED ORDER — SODIUM CHLORIDE 0.9 % IV SOLN: 250.0000 mL | INTRAVENOUS | Status: DC | PRN

## 2014-04-03 MED ORDER — DIPHENHYDRAMINE HCL 50 MG/ML IJ SOLN
25.0000 mg | INTRAMUSCULAR | Status: AC
Start: 2014-04-03 — End: 2014-04-03
  Administered 2014-04-03: 15:00:00 25 mg via INTRAVENOUS
  Filled 2014-04-03: qty 1

## 2014-04-03 MED ORDER — SODIUM CHLORIDE 0.9 % IJ SOLN: 3.0000 mL | Freq: Two times a day (BID) | INTRAMUSCULAR | Status: DC

## 2014-04-03 MED ORDER — SODIUM CHLORIDE 0.9 % IV SOLN: 1.0000 mL/kg/h | INTRAVENOUS | Status: AC

## 2014-04-03 MED ORDER — VERAPAMIL HCL 2.5 MG/ML IV SOLN
INTRAVENOUS | Status: AC
  Filled 2014-04-03: qty 2

## 2014-04-03 MED ORDER — CLOPIDOGREL BISULFATE 75 MG PO TABS
75.0000 mg | ORAL_TABLET | Freq: Every day | ORAL | Status: DC
  Administered 2014-04-04: 09:00:00 75 mg via ORAL
  Filled 2014-04-03: qty 1

## 2014-04-03 MED ORDER — MIDAZOLAM HCL 2 MG/2ML IJ SOLN
INTRAMUSCULAR | Status: AC
  Filled 2014-04-03: qty 2

## 2014-04-03 MED ORDER — SODIUM CHLORIDE 0.9 % IJ SOLN: 3.0000 mL | INTRAMUSCULAR | Status: DC | PRN

## 2014-04-03 MED ORDER — LIDOCAINE HCL (PF) 1 % IJ SOLN
INTRAMUSCULAR | Status: AC
  Filled 2014-04-03: qty 30

## 2014-04-03 MED ORDER — SODIUM CHLORIDE 0.9 % IV SOLN
0.2500 mg/kg/h | INTRAVENOUS | Status: DC
  Filled 2014-04-03: qty 250

## 2014-04-03 MED ORDER — HEPARIN (PORCINE) IN NACL 2-0.9 UNIT/ML-% IJ SOLN
INTRAMUSCULAR | Status: AC
  Filled 2014-04-03: qty 1000

## 2014-04-03 MED ORDER — FAMOTIDINE IN NACL 20-0.9 MG/50ML-% IV SOLN
20.0000 mg | Freq: Once | INTRAVENOUS | Status: AC
  Administered 2014-04-03: 13:00:00 20 mg via INTRAVENOUS
  Filled 2014-04-03: qty 50

## 2014-04-03 MED ORDER — FAMOTIDINE IN NACL 20-0.9 MG/50ML-% IV SOLN: 20.0000 mg | INTRAVENOUS | Status: DC

## 2014-04-03 MED ORDER — BIVALIRUDIN 250 MG IV SOLR
INTRAVENOUS | Status: AC
  Filled 2014-04-03: qty 250

## 2014-04-03 MED ORDER — SODIUM CHLORIDE 0.9 % IV SOLN: 1.0000 mL/kg/h | INTRAVENOUS | Status: DC

## 2014-04-03 MED ORDER — FENTANYL CITRATE 0.05 MG/ML IJ SOLN
INTRAMUSCULAR | Status: AC
  Filled 2014-04-03: qty 2

## 2014-04-03 MED ORDER — CLOPIDOGREL BISULFATE 300 MG PO TABS
600.0000 mg | ORAL_TABLET | Freq: Once | ORAL | Status: AC
  Administered 2014-04-03: 13:00:00 600 mg via ORAL
  Filled 2014-04-03: qty 2

## 2014-04-03 MED ORDER — HEPARIN SODIUM (PORCINE) 1000 UNIT/ML IJ SOLN
INTRAMUSCULAR | Status: AC
  Filled 2014-04-03: qty 1

## 2014-04-03 MED ORDER — METHYLPREDNISOLONE SODIUM SUCC 125 MG IJ SOLR
125.0000 mg | INTRAMUSCULAR | Status: AC
  Administered 2014-04-03: 125 mg via INTRAVENOUS
  Filled 2014-04-03: qty 2

## 2014-04-03 NOTE — H&P (View-Only) (Signed)
TELEMETRY: Reviewed telemetry pt in  NSR: Filed Vitals:   04/02/14 1600 04/02/14 2045 04/03/14 0006 04/03/14 0600  BP: 111/77 120/86 97/61 120/79  Pulse: 64 77 74 72  Temp:  98.2 F (36.8 C) 97.5 F (36.4 C) 97.7 F (36.5 C)  TempSrc:  Oral Oral Oral  Resp:  16 18 20   Height:      Weight:   189 lb 9.5 oz (86 kg)   SpO2: 97% 96% 97% 95%    Intake/Output Summary (Last 24 hours) at 04/03/14 1123 Last data filed at 04/03/14 0700  Gross per 24 hour  Intake   1920 ml  Output    450 ml  Net   1470 ml   Filed Weights   04/02/14 0642 04/03/14 0006  Weight: 189 lb (85.73 kg) 189 lb 9.5 oz (86 kg)    Subjective Patient feels well. No chest pain or SOB.   [name] aspirin  81 mg Oral Daily  . atorvastatin  40 mg Oral Daily  . ipratropium  2 spray Each Nare QID  . isosorbide mononitrate  30 mg Oral Daily  . lisinopril  20 mg Oral Daily  . lopinavir-ritonavir  2 tablet Oral BID  . metoprolol  50 mg Oral BID  . pantoprazole  40 mg Oral Daily  . raltegravir  400 mg Oral BID  . risperiDONE  1 mg Oral BID   . sodium chloride 60 mL/hr at 04/03/14 0600    LABS: Basic Metabolic Panel:  Recent Labs  04/02/14 0730  NA 139  K 4.2  CL 105  CO2 23  GLUCOSE 142*  BUN 11  CREATININE 1.08  CALCIUM 9.2   Liver Function Tests: No results found for this basename: AST, ALT, ALKPHOS, BILITOT, PROT, ALBUMIN,  in the last 72 hours No results found for this basename: LIPASE, AMYLASE,  in the last 72 hours CBC:  Recent Labs  04/02/14 0730  WBC 3.8*  HGB 13.3  HCT 38.7*  MCV 78.7  PLT 163   Ecg: NSR with PVCs, ST-T wave abnormality c/w anterior ischemia. Low voltage.  Radiology/Studies:  No results found.  RIGHT AND LEFT HEART CARDIAC CATHETERIZATION  HISTORY:  Richard [name] is a 58 y.o. male a remote history of a CVA and a history of HIV, which he contracted in the early 1990s, which has been stable on medical therapy. A recent nuclear perfusion study revealed an  ejection fraction of 27% with extensive regions of scar and multiple coronary territories without definitive ischemia. An echo Doppler study revealed an ejection fraction of 20-25%. He now is referred for right and left heart cardiac catheterization to  PROCEDURE: Right and left heart cardiac catheterization: Coronary angiography, Swan-Ganz catheterization with cardiac output determination by the thermodilution and Fick methods, left ventriculography.  The patient was brought to the second floor  Cardiac cath lab in the postabsorptive state. Versed 2 mg and fentanyl 50 mcg were administered for conscious sedation. The right groin was prepped and draped in sterile fashion and a 5 [place] arterial sheath and 7 French venous sheath were inserted without difficulty. A Swan-Ganz catheter was advanced into the venous sheath and pressures were obtained in the right atrium, right ventricle, pulmonary artery, and pulmonary capillary wedge position. Cardiac outputs were obtained by the thermodilution and assumed Fick methods. Oxygen saturation was obtained in the pulmonary artery and aorta. A pigtail catheter was inserted and simultaneous AO/PA pressures were recorded. The pigtail catheter was advanced into the left  ventricle and simultaneous left ventricular and PCW pressures were recorded. Left ventriculography was performed in the RAO projection. A left ventricle to aorta pullback was performed. The pigtail catheter was then removed and diagnostic catheterization to delineate the coronary anatomy was performed utilizing 5 French Judkins 4 left and right diagnostic catheters. All catheters were removed and the patient. Hemostasis was obtained by direct manual pressure. The patient tolerated the procedure well and returned to his room in satisfactory condition.  HEMODYNAMICS:  RA: Mean 3; a 7 v 5  RV: 25/4  PA: 25/13  PC: 12 mean initially  LV: 103/17  AO: 103/70  Oxygen saturation in the aorta 96% and the  pulmonary artery 65%  Cardiac output: 2.8 l/min (Thermo); 4.8 (Fick)  Cardiac index: 1.4 l/m/m2 2.4  ANGIOGRAPHY:  Left main: Angiographically normal vessel, which bifurcated into the LAD and left circumflex vessel.  LAD: The LAD gave rise to a proximal diagonal vessel. It was 80 and 90% segmental stenosis in the first diagonal vessel. After the takeoff of the first septal perforating artery, the LAD had diffuse narrowing of 80-90% and extended to the take off of the smaller second diagonal branch. There was a 95% apical LAD stenosis.  Left circumflex: Codominant vessel that gave rise to 2 proximal marginal vessels. The distal vessel supplied the PDA and it was 95% stenosis in this distal inferior LV). She. There was collateralization to the distal RCA via the left coronary injection.  Right coronary artery: This arose from the left cusp. An AR-2 catheter was necessary for selective engagement. This was totally occluded proximally. There is faint antegrade collateralization to several branches, as well as left to right distal collateralization.  Left ventriculography dilated left ventricle with an ejection fraction of 15%. There was severe global hypokinesis.  IMPRESSION:  Severe cardiomyopathy with an ejection fraction of approximately 15%.  Multi-vessel coronary obstructive disease with 80 and 90% stenoses in the first diagonal branch of the LAD, 80-90% LAD stenosis after the first septal perforating artery and 95% apical LAD stenosis; 95% distal inferior LV branch stenosis of the distal circumflex coronary artery; and total occlusion of the proximal RCA with left-to-right collaterals.  Normal right heart pressures without evidence for pulmonary hypertension.  RECOMMENDATION:  Richard [name] has severe LV dysfunction, which has deteriorated since 2003, when apparently by an ER report, his ejection fraction was 33%. He does have significant multi-vessel CAD as described. Surgical consultation will be  obtained for consideration of possible CABG revascularization surgery. Consideration for myocardial viability study may be necessary. If he is turned down for CABG surgery, PCI to his LAD, diagonal, and distal circumflex should be considered.  Troy Sine, MD, [facility]  04/02/2014  2:20 PM       PHYSICAL EXAM General: Well developed, well nourished, in no acute distress. Head: Normocephalic, atraumatic, sclera non-icteric, oropharynx is clear Neck: Negative for carotid bruits. JVD not elevated. No adenopathy Lungs: Clear bilaterally to auscultation without wheezes, rales, or rhonchi. Breathing is unlabored. Heart: RRR S1 S2 without murmurs, rubs, or gallops.  Abdomen: Soft, non-tender, non-distended with normoactive bowel sounds. No hepatomegaly. No rebound/guarding. No obvious abdominal masses. Msk:  Strength and tone appears normal for age. Extremities: No clubbing, cyanosis or edema.  Distal pedal pulses are 2+ and equal bilaterally. Neuro: Alert and oriented X 3. Moves all extremities spontaneously. Psych:  Responds to questions appropriately with a normal affect.  ASSESSMENT AND PLAN: 1. CAD- severe 3 vessel disease. I reviewed films. Patient has  high grade stenosis in the proximal to mid LAD and severe stenosis in the distal LAD at the apex. There is also a long diagonal branch with 99% proximal stenosis. The vessel distal to this lesion looks diffusely diseased and small but there is only TIMI 1 flow. The LCx is dominant and there is severe disease in the mid PDA. The RCA is occluded and fills by left to right collaterals. He does describe symptoms of exertional angina-class 3 since May. His myoview demonstrated predominant scar without significant ischemia. EF is severely reduced. I appreciate Dr. Everrett Coombe input. I agree that he has poor targets and probably the only good target is his LAD. I think we could accomplish similar revascularization with stenting with lower risk. I think the  distal LAD, PDA, and RCA disease will need to be managed medically. I have recommended PCI of the LAD and diagonal. The procedure and risks were reviewed including but not limited to death, myocardial infarction, stroke, arrythmias, bleeding, transfusion, emergency surgery, dye allergy, or renal dysfunction. The patient voices understanding and is agreeable to proceed..  2. Chronic systolic CHF with ischemic CM. Will optimize medical therapy with beta blocker, ACEi, nitrates. May add hydralazine as BP allows. Would reassess LV function in 3 months post revascularization. If EF still low may need ICD.  3. HTN  4. Hyperlipidemia. On lipitor.  5. Old CVA  6. HIV disease  Present on Admission:  . Cardiomyopathy . Chest pain of uncertain etiology  Signed, Herson Prichard [place], [place] 04/03/2014 11:23 AM

## 2014-04-03 NOTE — CV Procedure (Signed)
CARDIAC CATH NOTE  Name: Richard [name] MRN: [id number] DOB: December 03, 1955  Procedure: PTCA and stenting of the mid LAD and the proximal to distal first diagonal.  Indication: 58 yo BM with ischemic cardiomyopathy and class 3 angina despite optimal medical therapy. Diagnostic cardiac cath showed severe 3 vessel disease with 90% mid LAD, 99% diffusely diseased diagonal, 90-95% PDA, and occluded nondominant RCA with collaterals. CT surgery felt that he had poor targets for CABG so PCI was recommended for LAD +/- diagonal.  Procedural Details: The right wrist was prepped, draped, and anesthetized with 1% lidocaine. Using the modified Seldinger technique, a 6 Fr slender sheath was introduced into the radial artery. 3 mg verapamil was administered through the radial sheath. Weight-based bivalirudin was given for anticoagulation. Once a therapeutic ACT was achieved, a 6 [place] XBLAD guide catheter was inserted.  A prowater coronary guidewire was used to cross the lesion in the LAD.  The lesion was predilated with a 2.0 mm balloon.  The lesion was then stented with a 3.0 x 38 mm Promus stent.  The stent was postdilated with a 3.25 mm noncompliant balloon.  Following PCI, there was 0% residual stenosis and TIMI-3 flow.  We next addressed the diagonal disease. This vessel was subtotal with TIMI 1 flow. There appeared to multiple areas of stenosis in the vessel. Review of angiograms from 2003 demonstrated that this was a large vessel. Using the same guide we were unable to cross the occlusion with a prowater wire. We switched to a Fielder XT wire and were able to cross. Despite good wire support we were unable to cross with a 2.0 mm balloon. We were also unable to cross with a 1.2 mm balloon. A Guideliner support catheter was then inserted and advanced carefully into the diagonal branch over a balloon. Using this support we were able to cross the lesion with the 1.2 mm balloon and multiple inflations were  performed from the proximal to distal vessel. We then upgraded to a 2.0 mm balloon and another series of inflations were performed. The Guideliner was then withdrawn into the guide. A 2.5 x 38 mm Promus stent was then deployed in the mid to distal vessel. A second 3.0 x 38 mm Promus stent was deployed in an overlapping fashion in the proximal to mid vessel. At this point there appeared to be residual disease distal to the stent. We attempted to stent this area but could not pass a stent. The distal vessel was dilated with a 2.0 mm balloon and then more aggressively with a 2.5 mm [place] balloon. The lesion distally was resistant to inflation so we elected to stent just a short segment distally where there was an edge dissection. This was stented with a 2.25 x 12 mm Promus stent. The mid to distal stented segment was post dilated with a 2.5 mm [place] balloon and the proximal to mid vessel was post dilated with a 3.0 mm [place] balloon. Following PCI there was 0% residual stenosis in the stented segments with TIMI 3 flow. There was still calcified disease distal to the stent that will be treated medically.  Final angiography confirmed an good result. The patient tolerated the procedure well. There were no immediate procedural complications. A TR band was used for radial hemostasis. The patient was transferred to the post catheterization recovery area for further monitoring.  Lesion Data: Vessel: LAD-mid Percent stenosis (pre): 90% TIMI-flow (pre):  3 Stent:  3.0 x 38 mm Promus stent Percent stenosis (  post): 0% TIMI-flow (post): 3  Vessel #2: proximal to distal large first diagonal Percent stenosis (pre): 99+% TIMI flow pre 1 Stent: 3.0 x 38, 2.5 x 38, and 2.25 x 12 mm Promus stents. Percent stenosis post: 0% TIMI flow post 3  Conclusions:  1. Successful stenting of the mid LAD with DES 2. Successful stenting of the proximal to distal diagonal with DES x 3.   Recommendations: Continue DAPT  indefinitely. Anticipate DC in am if stable.   Elizette Shek [place], MDFACC 04/03/2014, 5:34 PM

## 2014-04-03 NOTE — Progress Notes (Signed)
TELEMETRY: Reviewed telemetry pt in  NSR: Filed Vitals:   04/02/14 1600 04/02/14 2045 04/03/14 0006 04/03/14 0600  BP: 111/77 120/86 97/61 120/79  Pulse: 64 77 74 72  Temp:  98.2 F (36.8 C) 97.5 F (36.4 C) 97.7 F (36.5 C)  TempSrc:  Oral Oral Oral  Resp:  16 18 20   Height:      Weight:   189 lb 9.5 oz (86 kg)   SpO2: 97% 96% 97% 95%    Intake/Output Summary (Last 24 hours) at 04/03/14 1123 Last data filed at 04/03/14 0700  Gross per 24 hour  Intake   1920 ml  Output    450 ml  Net   1470 ml   Filed Weights   04/02/14 0642 04/03/14 0006  Weight: 189 lb (85.73 kg) 189 lb 9.5 oz (86 kg)    Subjective Patient feels well. No chest pain or SOB.   [name] aspirin  81 mg Oral Daily  . atorvastatin  40 mg Oral Daily  . ipratropium  2 spray Each Nare QID  . isosorbide mononitrate  30 mg Oral Daily  . lisinopril  20 mg Oral Daily  . lopinavir-ritonavir  2 tablet Oral BID  . metoprolol  50 mg Oral BID  . pantoprazole  40 mg Oral Daily  . raltegravir  400 mg Oral BID  . risperiDONE  1 mg Oral BID   . sodium chloride 60 mL/hr at 04/03/14 0600    LABS: Basic Metabolic Panel:  Recent Labs  04/02/14 0730  NA 139  K 4.2  CL 105  CO2 23  GLUCOSE 142*  BUN 11  CREATININE 1.08  CALCIUM 9.2   Liver Function Tests: No results found for this basename: AST, ALT, ALKPHOS, BILITOT, PROT, ALBUMIN,  in the last 72 hours No results found for this basename: LIPASE, AMYLASE,  in the last 72 hours CBC:  Recent Labs  04/02/14 0730  WBC 3.8*  HGB 13.3  HCT 38.7*  MCV 78.7  PLT 163   Ecg: NSR with PVCs, ST-T wave abnormality c/w anterior ischemia. Low voltage.  Radiology/Studies:  No results found.  RIGHT AND LEFT HEART CARDIAC CATHETERIZATION  HISTORY:  Richard [name] is a 58 y.o. male a remote history of a CVA and a history of HIV, which he contracted in the early 1990s, which has been stable on medical therapy. A recent nuclear perfusion study revealed an  ejection fraction of 27% with extensive regions of scar and multiple coronary territories without definitive ischemia. An echo Doppler study revealed an ejection fraction of 20-25%. He now is referred for right and left heart cardiac catheterization to  PROCEDURE: Right and left heart cardiac catheterization: Coronary angiography, Swan-Ganz catheterization with cardiac output determination by the thermodilution and Fick methods, left ventriculography.  The patient was brought to the second floor [place] Cardiac cath lab in the postabsorptive state. Versed 2 mg and fentanyl 50 mcg were administered for conscious sedation. The right groin was prepped and draped in sterile fashion and a 5 [place] arterial sheath and 7 French venous sheath were inserted without difficulty. A Swan-Ganz catheter was advanced into the venous sheath and pressures were obtained in the right atrium, right ventricle, pulmonary artery, and pulmonary capillary wedge position. Cardiac outputs were obtained by the thermodilution and assumed Fick methods. Oxygen saturation was obtained in the pulmonary artery and aorta. A pigtail catheter was inserted and simultaneous AO/PA pressures were recorded. The pigtail catheter was advanced into the left  ventricle and simultaneous left ventricular and PCW pressures were recorded. Left ventriculography was performed in the RAO projection. A left ventricle to aorta pullback was performed. The pigtail catheter was then removed and diagnostic catheterization to delineate the coronary anatomy was performed utilizing 5 French Judkins 4 left and right diagnostic catheters. All catheters were removed and the patient. Hemostasis was obtained by direct manual pressure. The patient tolerated the procedure well and returned to his room in satisfactory condition.  HEMODYNAMICS:  RA: Mean 3; a 7 v 5  RV: 25/4  PA: 25/13  PC: 12 mean initially  LV: 103/17  AO: 103/70  Oxygen saturation in the aorta 96% and the  pulmonary artery 65%  Cardiac output: 2.8 l/min (Thermo); 4.8 (Fick)  Cardiac index: 1.4 l/m/m2 2.4  ANGIOGRAPHY:  Left main: Angiographically normal vessel, which bifurcated into the LAD and left circumflex vessel.  LAD: The LAD gave rise to a proximal diagonal vessel. It was 80 and 90% segmental stenosis in the first diagonal vessel. After the takeoff of the first septal perforating artery, the LAD had diffuse narrowing of 80-90% and extended to the take off of the smaller second diagonal branch. There was a 95% apical LAD stenosis.  Left circumflex: Codominant vessel that gave rise to 2 proximal marginal vessels. The distal vessel supplied the PDA and it was 95% stenosis in this distal inferior LV). She. There was collateralization to the distal RCA via the left coronary injection.  Right coronary artery: This arose from the left cusp. An AR-2 catheter was necessary for selective engagement. This was totally occluded proximally. There is faint antegrade collateralization to several branches, as well as left to right distal collateralization.  Left ventriculography dilated left ventricle with an ejection fraction of 15%. There was severe global hypokinesis.  IMPRESSION:  Severe cardiomyopathy with an ejection fraction of approximately 15%.  Multi-vessel coronary obstructive disease with 80 and 90% stenoses in the first diagonal branch of the LAD, 80-90% LAD stenosis after the first septal perforating artery and 95% apical LAD stenosis; 95% distal inferior LV branch stenosis of the distal circumflex coronary artery; and total occlusion of the proximal RCA with left-to-right collaterals.  Normal right heart pressures without evidence for pulmonary hypertension.  RECOMMENDATION:  Richard [name] has severe LV dysfunction, which has deteriorated since 2003, when apparently by an ER report, his ejection fraction was 33%. He does have significant multi-vessel CAD as described. Surgical consultation will be  obtained for consideration of possible CABG revascularization surgery. Consideration for myocardial viability study may be necessary. If he is turned down for CABG surgery, PCI to his LAD, diagonal, and distal circumflex should be considered.  Troy Sine, MD, [facility]  04/02/2014  2:20 PM       PHYSICAL EXAM General: Well developed, well nourished, in no acute distress. Head: Normocephalic, atraumatic, sclera non-icteric, oropharynx is clear Neck: Negative for carotid bruits. JVD not elevated. No adenopathy Lungs: Clear bilaterally to auscultation without wheezes, rales, or rhonchi. Breathing is unlabored. Heart: RRR S1 S2 without murmurs, rubs, or gallops.  Abdomen: Soft, non-tender, non-distended with normoactive bowel sounds. No hepatomegaly. No rebound/guarding. No obvious abdominal masses. Msk:  Strength and tone appears normal for age. Extremities: No clubbing, cyanosis or edema.  Distal pedal pulses are 2+ and equal bilaterally. Neuro: Alert and oriented X 3. Moves all extremities spontaneously. Psych:  Responds to questions appropriately with a normal affect.  ASSESSMENT AND PLAN: 1. CAD- severe 3 vessel disease. I reviewed films. Patient has  high grade stenosis in the proximal to mid LAD and severe stenosis in the distal LAD at the apex. There is also a long diagonal branch with 99% proximal stenosis. The vessel distal to this lesion looks diffusely diseased and small but there is only TIMI 1 flow. The LCx is dominant and there is severe disease in the mid PDA. The RCA is occluded and fills by left to right collaterals. He does describe symptoms of exertional angina-class 3 since May. His myoview demonstrated predominant scar without significant ischemia. EF is severely reduced. I appreciate Dr. Everrett Coombe input. I agree that he has poor targets and probably the only good target is his LAD. I think we could accomplish similar revascularization with stenting with lower risk. I think the  distal LAD, PDA, and RCA disease will need to be managed medically. I have recommended PCI of the LAD and diagonal. The procedure and risks were reviewed including but not limited to death, myocardial infarction, stroke, arrythmias, bleeding, transfusion, emergency surgery, dye allergy, or renal dysfunction. The patient voices understanding and is agreeable to proceed..  2. Chronic systolic CHF with ischemic CM. Will optimize medical therapy with beta blocker, ACEi, nitrates. May add hydralazine as BP allows. Would reassess LV function in 3 months post revascularization. If EF still low may need ICD.  3. HTN  4. Hyperlipidemia. On lipitor.  5. Old CVA  6. HIV disease  Present on Admission:  . Cardiomyopathy . Chest pain of uncertain etiology  Signed, Ashyr [place], [place] 04/03/2014 11:23 AM

## 2014-04-03 NOTE — Progress Notes (Signed)
[address]       [place] [address]             [phone]                 1 Day Post-Op Procedure(s) (LRB): LEFT AND RIGHT HEART CATHETERIZATION WITH CORONARY ANGIOGRAM (N/A)  LOS: 1 day   Subjective: No chest pain or arm pain today  Objective: Vital signs in last 24 hours: Patient Vitals for the past 24 hrs:  BP Temp Temp src Pulse Resp SpO2 Weight  04/03/14 0600 120/79 mmHg 97.7 F (36.5 C) Oral 72 20 95 % -  04/03/14 0006 97/61 mmHg 97.5 F (36.4 C) Oral 74 18 97 % 189 lb 9.5 oz (86 kg)  04/02/14 2045 120/86 mmHg 98.2 F (36.8 C) Oral 77 16 96 % -  04/02/14 1600 111/77 mmHg - - 64 - 97 % -  04/02/14 1530 126/73 mmHg - - 64 - 98 % -  04/02/14 1500 125/76 mmHg - - 66 - 98 % -  04/02/14 1445 133/80 mmHg - - 66 - 99 % -  04/02/14 1430 123/80 mmHg - - 69 - 97 % -  04/02/14 1415 153/91 mmHg - - 70 12 100 % -  04/02/14 1406 118/84 mmHg 98.1 F (36.7 C) Oral 66 18 98 % -  04/02/14 1340 114/74 mmHg - - 64 21 98 % -    Filed Weights   04/02/14 0642 04/03/14 0006  Weight: 189 lb (85.73 kg) 189 lb 9.5 oz (86 kg)    Hemodynamic parameters for last 24 hours:    Intake/Output from previous day: 08/25 0701 - 08/26 0700 In: 1920 [P.O.:840; I.V.:1080] Out: 450 [Urine:450] Intake/Output this shift:    Scheduled Meds: . aspirin  81 mg Oral Daily  . atorvastatin  40 mg Oral Daily  . diphenhydrAMINE  25 mg Intravenous Pre-Cath  . famotidine (PEPCID) IV  20 mg Intravenous Once  . ipratropium  2 spray Each Nare QID  . isosorbide mononitrate  30 mg Oral Daily  . lisinopril  20 mg Oral Daily  . lopinavir-ritonavir  2 tablet Oral BID  . methylPREDNISolone (SOLU-MEDROL) injection  125 mg Intravenous Pre-Cath  . metoprolol  50 mg Oral BID  . pantoprazole  40 mg Oral Daily  . raltegravir  400 mg Oral BID  . risperiDONE  1 mg Oral BID  . sodium chloride  3 mL Intravenous Q12H   Continuous Infusions: . sodium chloride 60 mL/hr at 04/03/14 0600  .  sodium chloride     PRN Meds:.sodium chloride, acetaminophen, loratadine, ondansetron (ZOFRAN) IV, sodium chloride, traMADol  General appearance: alert and cooperative Neurologic: left leg Heart: regular rate and rhythm, S1, S2 normal, no murmur, click, rub or gallop Lungs: clear to auscultation bilaterally Abdomen: soft, non-tender; bowel sounds normal; no masses,  no organomegaly Extremities: extremities normal, atraumatic, no cyanosis or edema and Homans sign is negative, no sign of DVT  Lab Results: CBC: Recent Labs  04/02/14 0730  WBC 3.8*  HGB 13.3  HCT 38.7*  PLT 163   BMET:  Recent Labs  04/02/14 0730  NA 139  K 4.2  CL 105  CO2 23  GLUCOSE 142*  BUN 11  CREATININE 1.08  CALCIUM 9.2    PT/INR:  Recent Labs  04/02/14 0730  LABPROT 13.6  INR 1.04     Radiology No results found.   Assessment/Plan: S/P Procedure(s) (LRB): LEFT AND RIGHT HEART  CATHETERIZATION WITH CORONARY ANGIOGRAM (N/A) Films reviewed with Dr Darcey Nora and Lonia Blood  With poor targets in the right and cirx system and with poor lv function has ben recommended to patient to proceed with stent/angioplasty rather then CABG.   Grace Isaac MD 04/03/2014 1:29 PM

## 2014-04-03 NOTE — Interval H&P Note (Signed)
History and Physical Interval Note:  04/03/2014 2:46 PM  Richard [name]  has presented today for surgery, with the diagnosis of cad  The various methods of treatment have been discussed with the patient and family. After consideration of risks, benefits and other options for treatment, the patient has consented to  Procedure(s): PERCUTANEOUS CORONARY STENT INTERVENTION (PCI-S) (N/A) as a surgical intervention .  The patient's history has been reviewed, patient examined, no change in status, stable for surgery.  I have reviewed the patient's chart and labs.  Questions were answered to the patient's satisfaction.   Cath Lab Visit (complete for each Cath Lab visit)  Clinical Evaluation Leading to the Procedure:   ACS: Yes.    Non-ACS:    Anginal Classification: CCS III  Anti-ischemic medical therapy: Maximal Therapy (2 or more classes of medications)  Non-Invasive Test Results: High-risk stress test findings: cardiac mortality >3%/year  Prior CABG: No previous CABG        [facility] 04/03/2014 2:46 PM

## 2014-04-04 ENCOUNTER — Encounter (HOSPITAL_COMMUNITY): Payer: Self-pay | Admitting: Physician Assistant

## 2014-04-04 DIAGNOSIS — Z8673 Personal history of transient ischemic attack (TIA), and cerebral infarction without residual deficits: Secondary | ICD-10-CM

## 2014-04-04 DIAGNOSIS — I2510 Atherosclerotic heart disease of native coronary artery without angina pectoris: Secondary | ICD-10-CM

## 2014-04-04 DIAGNOSIS — I5022 Chronic systolic (congestive) heart failure: Secondary | ICD-10-CM

## 2014-04-04 DIAGNOSIS — I428 Other cardiomyopathies: Secondary | ICD-10-CM

## 2014-04-04 DIAGNOSIS — I509 Heart failure, unspecified: Secondary | ICD-10-CM

## 2014-04-04 DIAGNOSIS — I200 Unstable angina: Secondary | ICD-10-CM

## 2014-04-04 LAB — BASIC METABOLIC PANEL
ANION GAP: 12 (ref 5–15)
BUN: 17 mg/dL (ref 6–23)
CHLORIDE: 105 meq/L (ref 96–112)
CO2: 21 meq/L (ref 19–32)
CREATININE: 1.13 mg/dL (ref 0.50–1.35)
Calcium: 8.6 mg/dL (ref 8.4–10.5)
GFR calc non Af Amer: 71 mL/min — ABNORMAL LOW (ref >90)
GFR, EST AFRICAN AMERICAN: 82 mL/min — AB (ref >90)
Glucose, Bld: 132 mg/dL — ABNORMAL HIGH (ref 70–99)
POTASSIUM: 4.1 meq/L (ref 3.7–5.3)
Sodium: 138 mEq/L (ref 137–147)

## 2014-04-04 LAB — CBC
HEMATOCRIT: 35.9 % — AB (ref 39.0–52.0)
HEMOGLOBIN: 12.5 g/dL — AB (ref 13.0–17.0)
MCH: 27.4 pg (ref 26.0–34.0)
MCHC: 34.8 g/dL (ref 30.0–36.0)
MCV: 78.7 fL (ref 78.0–100.0)
Platelets: 149 10*3/uL — ABNORMAL LOW (ref 150–400)
RBC: 4.56 MIL/uL (ref 4.22–5.81)
RDW: 14.7 % (ref 11.5–15.5)
WBC: 8.4 10*3/uL (ref 4.0–10.5)

## 2014-04-04 MED ORDER — CLOPIDOGREL BISULFATE 75 MG PO TABS: 75.0000 mg | ORAL_TABLET | Freq: Every day | ORAL | Status: DC

## 2014-04-04 MED ORDER — FUROSEMIDE 20 MG PO TABS: 20.0000 mg | ORAL_TABLET | Freq: Every day | ORAL | Status: DC

## 2014-04-04 MED ORDER — PANTOPRAZOLE SODIUM 40 MG PO TBEC: 40.0000 mg | DELAYED_RELEASE_TABLET | Freq: Every day | ORAL | Status: DC | PRN

## 2014-04-04 MED ORDER — FUROSEMIDE 20 MG PO TABS: 20.0000 mg | ORAL_TABLET | Freq: Every day | ORAL | Status: DC | PRN

## 2014-04-04 MED FILL — Sodium Chloride IV Soln 0.9%: INTRAVENOUS | Qty: 50 | Status: AC

## 2014-04-04 NOTE — Discharge Instructions (Signed)
Angina Pectoris Angina pectoris is extreme discomfort in your chest, neck, or arm. Your doctor may call it just angina. It is caused by a lack of oxygen to your heart wall. It may feel like tightness or heavy pressure. It may feel like a crushing or squeezing pain. Some people say it feels like gas. It may go down your shoulders, back, and arms. Some people have symptoms other than pain. These include:  Tiredness.  Shortness of breath.  Cold sweats.  Feeling sick to your stomach (nausea). There are four types of angina:  Stable angina. This type often lasts the same amount of time each time it happens. Activity, stress, or excitement can bring it on. It often gets better after taking a medicine called nitroglycerin. This goes under your tongue.  Unstable angina. This type can happen when you are not active or even during sleep. It can suddenly get worse or happen more often. It may not get better after taking the special medicine. It can last up to 30 minutes.  Microvascular angina. This type is more common in women. It may be more severe or last longer than other types.  Prinzmetal angina. This type often happens when you are not active or in the early morning hours. HOME CARE   Only take medicines as told by your doctor.  Stay active or exercise more as told by your doctor.  Limit very hard activity as told by your doctor.  Limit heavy lifting as told by your doctor.  Keep a healthy weight.  Learn about and eat foods that are healthy for your heart.  Do not use any tobacco such as cigarettes, chewing tobacco, or e-cigarettes. GET HELP RIGHT AWAY IF:   You have chest, neck, deep shoulder, or arm pain or discomfort that lasts more than a few minutes.  You have chest, neck, deep shoulder, or arm pain or discomfort that goes away and comes back over and over again.  You have heavy sweating that seems to happen for no reason.  You have shortness of breath or trouble  breathing.  Your angina does not get better after a few minutes of rest.  Your angina does not get better after you take nitroglycerin medicine. These can all be symptoms of a heart attack. Get help right away. Call your local emergency service (911 in [place] Do not  drive yourself to the hospital. Do not  wait to for your symptoms to go away. MAKE SURE YOU:   Understand these instructions.  Will watch your condition.  Will get help right away if you are not doing well or get worse. Document Released: 01/12/2008 Document Revised: 07/31/2013 Document Reviewed: 11/27/2013 [facility] Patient Information 2015 [place], [place]. This information is not intended to replace advice given to you by your health care provider. Make sure you discuss any questions you have with your health care provider.  No driving for 24 hours. No lifting over 5 lbs for 1 week. No sexual activity for 1 week. Keep procedure site clean & dry. If you notice increased pain, swelling, bleeding or pus, call/return!  You may shower, but no soaking baths/hot tubs/pools for 1 week.

## 2014-04-04 NOTE — Discharge Summary (Signed)
Discharge Summary   Patient ID: Richard [name],  MRN: [id number], DOB/AGE: 1956-05-04 58 y.o.  Admit date: 04/02/2014 Discharge date: 04/04/2014  Primary Care Provider: Pcp Not In System Primary Cardiologist: Dr. Claiborne Billings  Discharge Diagnoses Principal Problem:   Unstable angina Active Problems:   History of CVA (cerebrovascular accident)   Human immunodeficiency virus (HIV) disease   Essential hypertension   Hyperlipidemia   Depression   Cardiomyopathy, ischemic   3-vessel coronary artery disease   Allergies Allergies  Allergen Reactions  . Ivp Dye [Iodinated Diagnostic Agents] Hives    Procedures  Echocardiogram LV EF: 20% - 25%  ------------------------------------------------------------------- Indications: Chest pain 786.51.  ------------------------------------------------------------------- History: PMH: HIV, Numbness of left arm Dyspnea. Coronary artery disease. Stroke. Risk factors: Family history of coronary artery disease. Hypertension. Dyslipidemia.  ------------------------------------------------------------------- Study Conclusions  - Left ventricle: The cavity size was normal. Wall thickness was normal. Systolic function was severely reduced. The estimated ejection fraction was in the range of 20% to 25%. Severe global hypokinesis with more prounounced inferior hypokinesis. There is diastolic dysfunction with indeterminate LV filling pressure. - Mitral valve: Mildly thickened leaflets . Minimal leaflet tip late systolic prolapse. There was mild regurgitation. - Left atrium: LA Volume/BSA= 25.2 ml/m2. The atrium was normal in size. - Right ventricle: The cavity size was normal. Systolic function is reduced. Lateral annulus peak S velocity: 5.41 cm/s. - Right atrium: The atrium was normal in size. - Inferior vena cava: The vessel was normal in size. The respirophasic diameter changes were in the normal range (= 50%), consistent with normal  central venous pressure. - Pericardium, extracardiac: There was no pericardial effusion.  Impressions:  - Compared to a prior echo in 2008, there has been further decrease in LV function to 20-25% with predominantly global hypokinesis.      Cardiac catheterization Cath #1 RIGHT AND LEFT HEART CARDIAC CATHETERIZATION   PROCEDURE: Right and left heart cardiac catheterization: Coronary angiography, Swan-Ganz catheterization with cardiac output determination by the thermodilution and Fick methods, left ventriculography.  HEMODYNAMICS:  RA: Mean 3; a 7 v 5  RV: 25/4  PA: 25/13  PC: 12 mean initially  LV: 103/17  AO: 103/70  Oxygen saturation in the aorta 96% and the pulmonary artery 65%  Cardiac output: 2.8 l/min (Thermo); 4.8 (Fick)  Cardiac index: 1.4 l/m/m2 2.4  ANGIOGRAPHY:  Left main: Angiographically normal vessel, which bifurcated into the LAD and left circumflex vessel.  LAD: The LAD gave rise to a proximal diagonal vessel. It was 80 and 90% segmental stenosis in the first diagonal vessel. After the takeoff of the first septal perforating artery, the LAD had diffuse narrowing of 80-90% and extended to the take off of the smaller second diagonal branch. There was a 95% apical LAD stenosis.  Left circumflex: Codominant vessel that gave rise to 2 proximal marginal vessels. The distal vessel supplied the PDA and it was 95% stenosis in this distal inferior LV). She. There was collateralization to the distal RCA via the left coronary injection.  Right coronary artery: This arose from the left cusp. An AR-2 catheter was necessary for selective engagement. This was totally occluded proximally. There is faint antegrade collateralization to several branches, as well as left to right distal collateralization.  Left ventriculography dilated left ventricle with an ejection fraction of 15%. There was severe global hypokinesis.  IMPRESSION:  Severe cardiomyopathy with an ejection fraction of  approximately 15%.  Multi-vessel coronary obstructive disease with 80 and 90% stenoses in the first diagonal branch  of the LAD, 80-90% LAD stenosis after the first septal perforating artery and 95% apical LAD stenosis; 95% distal inferior LV branch stenosis of the distal circumflex coronary artery; and total occlusion of the proximal RCA with left-to-right collaterals.  Normal right heart pressures without evidence for pulmonary hypertension.  RECOMMENDATION:  Richard [name] has severe LV dysfunction, which has deteriorated since 2003, when apparently by an ER report, his ejection fraction was 33%. He does have significant multi-vessel CAD as described. Surgical consultation will be obtained for consideration of possible CABG revascularization surgery. Consideration for myocardial viability study may be necessary. If he is turned down for CABG surgery, PCI to his LAD, diagonal, and distal circumflex should be considered.    Cath #2 Procedure: PTCA and stenting of the mid LAD and the proximal to distal first diagonal.  Lesion Data:  Vessel: LAD-mid  Percent stenosis (pre): 90%  TIMI-flow (pre): 3  Stent: 3.0 x 38 mm Promus stent  Percent stenosis (post): 0%  TIMI-flow (post): 3  Vessel #2: proximal to distal large first diagonal  Percent stenosis (pre): 99+%  TIMI flow pre 1  Stent: 3.0 x 38, 2.5 x 38, and 2.25 x 12 mm Promus stents.  Percent stenosis post: 0%  TIMI flow post 3  Conclusions:  1. Successful stenting of the mid LAD with DES  2. Successful stenting of the proximal to distal diagonal with DES x 3.  Recommendations: Continue DAPT indefinitely.  Anticipate DC in am if stable.      Hospital Course  The patient is a 58 year old African American male with past medical history significant for hypertension, hyperlipidemia, history of HIV, and history of embolic CVA in 1025. He was last seen by Dr. Claiborne Billings on 03/22/2014 during which visit he complained of some left-sided chest  discomfort with walking and associated with left arm numbness. Symptom was mainly associated with activity. Patient underwent a nuclear perfusion study on 7/24 and noted his ejection fraction has decrease to 27%. Echocardiogram was obtained on 03/01/2014 also confirmed the ejection fraction 20-25% with severe global hypokinesis and more pronounced inferior hypokinesis. After discussing with the patient potential workup, it was decided for the patient to undergo elective cardiac catheterization.  Patient presented for elective left and right cardiac catheterization on 04/02/2014. Right heart cath showed cardiac index 2.4, cardiac output 4.8. Left heart cath demonstrated severe three-vessel disease with 80-90% stenosis D1, 80-90% mid LAD stenosis, 95% apical LAD stenosis, 95% distal inferior LV branch of the left circumflex stenosis, and total occlusion of the proximal RCA. CT surgery was consulted, who felt that the patient's RCA and left circumflex were poor targets, LAD was suitable for bypass target however there was no definitive sign of ischemia. Patient underwent repeat cardiac catheterization on 04/03/2014 and underwent successful PCI with DES to mid LAD and DES x3 to proximal and distal diagonal. His left circumflex lesion was left untreated. Patient was started on both aspirin and Plavix and he was recommended to continue dual antiplatelet indefinitely given severity of his CAD.   Patient was seen the morning of 04/04/2014, at which time he was doing well without significant chest discomfort or shortness breath. He is deemed stable for discharge. Patient has not needed any diuretic since admission. I have given him 20 mg of PRN Lasix. Patient has been educated on fluid restriction, sodium restriction, and daily weight. He has been instructed to take Lasix if his weight increased from more than 3 pounds overnight or 5 lbs in a single  week. Patient will followup with Dr. Claiborne Billings or his APP in the clinic in 2-4  weeks. If he has recurrent anginal symptom, then his left circumflex lesion can be considered for PCI later. He should also be evaluated on followup to see if he need a more scheduled Lasix dose or if he need potassium chloride for potassium repletion.   Also the patient's medication [facility] through the [facility], I would give him 2 prescriptions of Plavix so he has enough for the first 30 days and send the second prescription through the [place] system.    Discharge Vitals Blood pressure 118/85, pulse 86, temperature 97.9 F (36.6 C), temperature source Oral, resp. rate 20, height 5' 9.5" (1.765 m), weight 189 lb 9.5 oz (86 kg), SpO2 95.00%.  Filed Weights   04/02/14 0642 04/03/14 0006 04/04/14 0432  Weight: 189 lb (85.73 kg) 189 lb 9.5 oz (86 kg) 189 lb 9.5 oz (86 kg)    Labs  CBC  Recent Labs  04/03/14 1310 04/04/14 0258  WBC 8.5 8.4  HGB 13.0 12.5*  HCT 36.5* 35.9*  MCV 77.5* 78.7  PLT 164 696*   Basic Metabolic Panel  Recent Labs  04/03/14 1310 04/04/14 0258  NA 143 138  K 3.9 4.1  CL 105 105  CO2 25 21  GLUCOSE 83 132*  BUN 13 17  CREATININE 1.16 1.13  CALCIUM 9.1 8.6    Disposition  Pt is being discharged home today in good condition.  Follow-up Plans & Appointments      Follow-up Information   Follow up with Troy Sine, MD On 04/22/2014. (9:30am)    Specialty:  Cardiology   Contact information:   [address] [place] [address] [phone]       Discharge Medications    Medication List    STOP taking these medications       omeprazole 20 MG capsule  Commonly known as:  PRILOSEC      TAKE these medications       aspirin 81 MG chewable tablet  Chew 81 mg by mouth daily.     atorvastatin 40 MG tablet  Commonly known as:  LIPITOR  Take 1 tablet (40 mg total) by mouth daily.     clopidogrel 75 MG tablet  Commonly known as:  PLAVIX  Take 1 tablet (75 mg total) by mouth daily with breakfast.     furosemide 20 MG  tablet  Commonly known as:  LASIX  Take 1 tablet (20 mg total) by mouth daily as needed. Take one if weight increase by 3 lbs overnight or 5 lbs in 1 week     ipratropium 0.06 % nasal spray  Commonly known as:  ATROVENT  Place 2 sprays into both nostrils 4 (four) times daily.     isosorbide mononitrate 30 MG 24 hr tablet  Commonly known as:  IMDUR  Take 1 tablet (30 mg total) by mouth daily.     lisinopril 20 MG tablet  Commonly known as:  PRINIVIL,ZESTRIL  Take 20 mg by mouth daily.     lopinavir-ritonavir 200-50 MG per tablet  Commonly known as:  KALETRA  Take 2 tablets by mouth 2 (two) times daily.     loratadine 10 MG tablet  Commonly known as:  CLARITIN  Take 10 mg by mouth daily as needed for allergies.     metoprolol 50 MG tablet  Commonly known as:  LOPRESSOR  Take 1 tablet (50 mg total) by mouth 2 (  two) times daily.     pantoprazole 40 MG tablet  Commonly known as:  PROTONIX  Take 1 tablet (40 mg total) by mouth daily as needed.     predniSONE 20 MG tablet  Commonly known as:  DELTASONE  Take 3 tablets  At bedtime the night prior to procedure. Take 3 tablets the morning of the procedure.     raltegravir 400 MG tablet  Commonly known as:  ISENTRESS  Take 400 mg by mouth 2 (two) times daily.     risperiDONE 2 MG tablet  Commonly known as:  RISPERDAL  Take 1 mg by mouth 2 (two) times daily.     traMADol 50 MG tablet  Commonly known as:  ULTRAM  Take 50 mg by mouth every 6 (six) hours as needed for moderate pain.        Duration of Discharge Encounter   Greater than 30 minutes including physician time.  Hilbert Corrigan PA-C Pager: [phone] 04/04/2014, 10:14 AM

## 2014-04-04 NOTE — Progress Notes (Signed)
Patient Name: Richard [name] Date of Encounter: 04/04/2014     Active Problems:   Chest pain of uncertain etiology   Cardiomyopathy   Unstable angina    SUBJECTIVE  Denies any chest pain or SOB  CURRENT MEDS . aspirin  81 mg Oral Daily  . atorvastatin  40 mg Oral Daily  . clopidogrel  75 mg Oral Q breakfast  . ipratropium  2 spray Each Nare QID  . isosorbide mononitrate  30 mg Oral Daily  . lisinopril  20 mg Oral Daily  . lopinavir-ritonavir  2 tablet Oral BID  . metoprolol  50 mg Oral BID  . pantoprazole  40 mg Oral Daily  . raltegravir  400 mg Oral BID  . risperiDONE  1 mg Oral BID    OBJECTIVE  Filed Vitals:   04/04/14 0000 04/04/14 0100 04/04/14 0200 04/04/14 0432  BP: 110/76 107/65 131/91 106/79  Pulse:      Temp:    97.9 F (36.6 C)  TempSrc:    Oral  Resp:      Height:      Weight:    189 lb 9.5 oz (86 kg)  SpO2:        Intake/Output Summary (Last 24 hours) at 04/04/14 0706 Last data filed at 04/03/14 1900  Gross per 24 hour  Intake    240 ml  Output    325 ml  Net    -85 ml   Filed Weights   04/02/14 0642 04/03/14 0006 04/04/14 0432  Weight: 189 lb (85.73 kg) 189 lb 9.5 oz (86 kg) 189 lb 9.5 oz (86 kg)    PHYSICAL EXAM  General: Pleasant, NAD. Neuro: Alert and oriented X 3. Moves all extremities spontaneously. Psych: Normal affect. HEENT:  Normal  Neck: Supple without bruits or JVD. Lungs:  Resp regular and unlabored, CTA. Heart: RRR no s3, s4, or murmurs. R radial cath site #2 stable Abdomen: Soft, non-tender, non-distended, BS + x 4.  R groin cath site #1 looks stable without bleeding or hematoma Extremities: No clubbing, cyanosis or edema. DP/PT/Radials 2+ and equal bilaterally.  Accessory Clinical Findings  CBC  Recent Labs  04/03/14 1310 04/04/14 0258  WBC 8.5 8.4  HGB 13.0 12.5*  HCT 36.5* 35.9*  MCV 77.5* 78.7  PLT 164 638*   Basic Metabolic Panel  Recent Labs  04/03/14 1310 04/04/14 0258  NA 143 138  K  3.9 4.1  CL 105 105  CO2 25 21  GLUCOSE 83 132*  BUN 13 17  CREATININE 1.16 1.13  CALCIUM 9.1 8.6    TELE NSR with HR 70s     Echocardiogram  03/01/2014 LV EF: 20% - 25%  ------------------------------------------------------------------- Indications: Chest pain 786.51.  ------------------------------------------------------------------- History: PMH: HIV, Numbness of left arm Dyspnea. Coronary artery disease. Stroke. Risk factors: Family history of coronary artery disease. Hypertension. Dyslipidemia.  ------------------------------------------------------------------- Study Conclusions  - Left ventricle: The cavity size was normal. Wall thickness was normal. Systolic function was severely reduced. The estimated ejection fraction was in the range of 20% to 25%. Severe global hypokinesis with more prounounced inferior hypokinesis. There is diastolic dysfunction with indeterminate LV filling pressure. - Mitral valve: Mildly thickened leaflets . Minimal leaflet tip late systolic prolapse. There was mild regurgitation. - Left atrium: LA Volume/BSA= 25.2 ml/m2. The atrium was normal in size. - Right ventricle: The cavity size was normal. Systolic function is reduced. Lateral annulus peak S velocity: 5.41 cm/s. - Right atrium: The atrium was normal  in size. - Inferior vena cava: The vessel was normal in size. The respirophasic diameter changes were in the normal range (= 50%), consistent with normal central venous pressure. - Pericardium, extracardiac: There was no pericardial effusion.  Impressions:  - Compared to a prior echo in 2008, there has been further decrease in LV function to 20-25% with predominantly global hypokinesis.      Radiology/Studies  No results found.  ASSESSMENT AND PLAN  1. Angina with decline in LV function  - cath 04/02/2014 severe 3 v disease 80-90% in D1, 80-90% mid LAD, 95% apical LAD, 95% distal inferior LV branck of LCx, total  occlusion of prox RCA  - CT surgery reviewed cath film believed RCA and LCx were poor target, LAD suitable however no sign of ischemia  - Cath 04/03/2014 DES to mid LAD, DES x3 to prox D1, DAPT indefinitely  - plan for discharge today  2. Ischemic cardiomyopathy  - EF 15% on cath 04/02/2014 3. Triple vessel CAD - see #1 4. H/o embolic stroke 5. HTN 6. HIV  Signed, Richard [name] Pager: [phone] Patient seen and examined and history reviewed. Agree with above findings and plan. Patient feels well. No chest pain or SOB. Lungs clear. No cath site hematoma. Labs stable. Will plan on DC home today. Will continue prior meds and add Plavix. Follow up in office in a couple of weeks.  Richard [name], [place] 04/04/2014 7:35 AM

## 2014-04-04 NOTE — Discharge Summary (Signed)
Patient seen and examined and history reviewed. Agree with above findings and plan. See earlier rounding note.  Peter [place], [place] 04/04/2014 12:48 PM

## 2014-04-04 NOTE — Progress Notes (Signed)
UR completed Celines Femia K. Shakeela Rabadan, RN, BSN, [place], CCM  04/04/2014 2:23 PM

## 2014-04-04 NOTE — Progress Notes (Signed)
CARDIAC REHAB PHASE I   PRE:  Rate/Rhythm: 78 SR PVCs  BP:  Supine:   Sitting: 111/58  Standing:    SaO2:   MODE:  Ambulation: 600 ft   POST:  Rate/Rhythm: 87 SR PVCs  BP:  Supine:   Sitting: 118/85  Standing:    SaO2:  0805-0855 Pt walked 600 ft with steady gait. No CP. Education completed with pt and wife. Gave CHF booklet and reviewed zones especially when to call MD. Pt knows to watch sodium and weigh self daily. Gave heart healthy diet also. Discussed CRP 2 and pt gave permission to refer to [facility] program. Reviewed NTG use and purpose of plavix. Understanding voiced.   Graylon Good, RN BSN  04/04/2014 8:52 AM

## 2014-04-08 ENCOUNTER — Encounter: Payer: Self-pay | Admitting: Cardiovascular Disease

## 2014-04-08 ENCOUNTER — Telehealth: Payer: Self-pay | Admitting: Cardiovascular Disease

## 2014-04-08 NOTE — Telephone Encounter (Signed)
Pt had stent put in on Wednesday,wants to know when can he drive? Also wife says she would like to know if he could have a home health nurse to come in? He also would like something for back pain.

## 2014-04-08 NOTE — Telephone Encounter (Signed)
Left message for pt to call.

## 2014-04-08 NOTE — Telephone Encounter (Signed)
Returning your call. °

## 2014-04-08 NOTE — Telephone Encounter (Signed)
Spoke with pt wife, aware he is clear to drive. They requested home health to check his vital signs, explained he would need to be home bound before home health could be covered. Given the okay to take tylenol for back pain.

## 2014-04-12 ENCOUNTER — Telehealth: Payer: Self-pay | Admitting: Cardiovascular Disease

## 2014-04-12 MED ORDER — METOPROLOL TARTRATE 50 MG PO TABS: 50.0000 mg | ORAL_TABLET | Freq: Two times a day (BID) | ORAL | Status: DC

## 2014-04-12 NOTE — Telephone Encounter (Signed)
Spoke with patient and informed him refills were sent into pharmacy

## 2014-04-12 NOTE — Telephone Encounter (Signed)
Pt called stating that he will be out of Metoprolol next week and would like to get it refilled. Please call  Thanks

## 2014-04-22 ENCOUNTER — Encounter: Payer: Self-pay | Admitting: Cardiovascular Disease

## 2014-04-22 ENCOUNTER — Ambulatory Visit (INDEPENDENT_AMBULATORY_CARE_PROVIDER_SITE_OTHER): Payer: Medicare Other | Admitting: Cardiovascular Disease

## 2014-04-22 VITALS — BP 110/60 | HR 64 | Ht 69.5 in | Wt 184.9 lb

## 2014-04-22 DIAGNOSIS — I200 Unstable angina: Secondary | ICD-10-CM | POA: Diagnosis not present

## 2014-04-22 DIAGNOSIS — IMO0001 Reserved for inherently not codable concepts without codable children: Secondary | ICD-10-CM | POA: Diagnosis not present

## 2014-04-22 DIAGNOSIS — I2510 Atherosclerotic heart disease of native coronary artery without angina pectoris: Secondary | ICD-10-CM | POA: Diagnosis not present

## 2014-04-22 DIAGNOSIS — E785 Hyperlipidemia, unspecified: Secondary | ICD-10-CM

## 2014-04-22 DIAGNOSIS — R195 Other fecal abnormalities: Secondary | ICD-10-CM

## 2014-04-22 DIAGNOSIS — I255 Ischemic cardiomyopathy: Secondary | ICD-10-CM

## 2014-04-22 DIAGNOSIS — B20 Human immunodeficiency virus [HIV] disease: Secondary | ICD-10-CM

## 2014-04-22 DIAGNOSIS — I10 Essential (primary) hypertension: Secondary | ICD-10-CM

## 2014-04-22 DIAGNOSIS — I2589 Other forms of chronic ischemic heart disease: Secondary | ICD-10-CM

## 2014-04-22 NOTE — Patient Instructions (Addendum)
Your physician recommends that you return for lab work fasting  Your physician recommends that you schedule a follow-up appointment and echo in 2 months.

## 2014-04-23 ENCOUNTER — Encounter: Payer: Self-pay | Admitting: Cardiovascular Disease

## 2014-04-23 DIAGNOSIS — IMO0001 Reserved for inherently not codable concepts without codable children: Secondary | ICD-10-CM | POA: Diagnosis not present

## 2014-04-23 DIAGNOSIS — I10 Essential (primary) hypertension: Secondary | ICD-10-CM | POA: Diagnosis not present

## 2014-04-23 DIAGNOSIS — I2510 Atherosclerotic heart disease of native coronary artery without angina pectoris: Secondary | ICD-10-CM | POA: Diagnosis not present

## 2014-04-23 DIAGNOSIS — R195 Other fecal abnormalities: Secondary | ICD-10-CM | POA: Diagnosis not present

## 2014-04-23 LAB — CBC
HCT: 39.4 % (ref 39.0–52.0)
Hemoglobin: 13.6 g/dL (ref 13.0–17.0)
MCH: 27 pg (ref 26.0–34.0)
MCHC: 34.5 g/dL (ref 30.0–36.0)
MCV: 78.3 fL (ref 78.0–100.0)
PLATELETS: 148 10*3/uL — AB (ref 150–400)
RBC: 5.03 MIL/uL (ref 4.22–5.81)
RDW: 15.6 % — ABNORMAL HIGH (ref 11.5–15.5)
WBC: 3.5 10*3/uL — ABNORMAL LOW (ref 4.0–10.5)

## 2014-04-23 LAB — LIPID PANEL
Cholesterol: 189 mg/dL (ref 0–200)
HDL: 29 mg/dL — ABNORMAL LOW (ref >39)
LDL CALC: 129 mg/dL — AB (ref 0–99)
Total CHOL/HDL Ratio: 6.5 Ratio
Triglycerides: 155 mg/dL — ABNORMAL HIGH (ref <150)
VLDL: 31 mg/dL (ref 0–40)

## 2014-04-23 LAB — COMPREHENSIVE METABOLIC PANEL
ALBUMIN: 4.3 g/dL (ref 3.5–5.2)
ALK PHOS: 75 U/L (ref 39–117)
ALT: 25 U/L (ref 0–53)
AST: 20 U/L (ref 0–37)
BILIRUBIN TOTAL: 0.9 mg/dL (ref 0.2–1.2)
BUN: 17 mg/dL (ref 6–23)
CO2: 25 mEq/L (ref 19–32)
Calcium: 9.1 mg/dL (ref 8.4–10.5)
Chloride: 103 mEq/L (ref 96–112)
Creat: 1.22 mg/dL (ref 0.50–1.35)
Glucose, Bld: 89 mg/dL (ref 70–99)
POTASSIUM: 4.3 meq/L (ref 3.5–5.3)
Sodium: 135 mEq/L (ref 135–145)
TOTAL PROTEIN: 7.3 g/dL (ref 6.0–8.3)

## 2014-04-23 LAB — CK: CK TOTAL: 91 U/L (ref 7–232)

## 2014-04-23 NOTE — Progress Notes (Signed)
Patient ID: Richard [name], male   DOB: 1956/03/10, 58 y.o.   MRN: [id number]     HPI:  Richard [name] is a 58 y.o. male who presents for preop evaluation following his recent cardiac catheterization.  Richard [name] suffered a CVA in 2003.  He has been followed at the [facility] hospital.  3 weeks ago while walking he developed an episode of left-sided chest pain with left arm radiation.  2 weeks ago, he developed several days of nonexertional type chest pain, which prompted a call hospital emergency room evaluation.  During his hospitalization.  His white count was low at 3.5, hemoglobin 13.8, hematocrit 40.8, platelets 1:15.  Chemistry profile was normal.  Troponin was negative.  His ECG showed nonspecific T-wave changes.  In 2003 he had undergone  cardiac catheterization, which was commented by the ER notes, which did not reveal significant coronary artery disease, however, was never able to find this report.  Prior to catheterization he did have an ejection fraction of 33% with multiple areas of scar noted on a nuclear perfusion study.   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  He does have a history of hypertension, as well as hyperlipidemia.  There is also a history of depression, for which he takes Risperdal.  I initially saw him, to to complaints of some episodes of left-sided chest pain with walking, associated with left arm numbness and increasing shortness of breath with activity and is referred for nuclear study. He underwent a recent nuclear perfusion study on 03/01/2014.  Ejection fraction was about 27%.  This showed extensive scar multiple coronary territories without definitive reversible ischemia.  An echo Doppler study, which was done on 03/01/2014 showed an ejection fraction of 20-25% with severe global hypokinesis, but more pronounced inferior hypokinesis.  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  He had multivessel CAD with 80 and 90% stenoses in the first diagonal branch of the LAD, 80-90% LAD stenoses after the first septal perforating artery, and 95% apical LAD stenosis.  There is a 95% distal inferior LV branch stenosis of the distal circumflex and the RCA was totally occluded proximally with left to right collaterals.  He had normal right heart pressures and there was no evidence for Polaner.  Hypertension.  He underwent surgical evaluation and was turned down.  The following day he underwent successful intervention to his diagonal and LAD by Dr. Martinique with insertion of a 3.0x38 mm Promus DES stent in the mid LAD and 3 stents placed in the diagonal vessel.  He was discharged on 04/04/2014.  Since hospital discharge, he denies any recent chest tightness that he had experienced previously.  His exertional shortness of breath has improved.  He is unaware of any palpitations.  His blood pressure has been somewhat low.  He also has noticed some dark school, but denied any awareness of black or bloody stool.  Past Medical History  Diagnosis Date  . HIV (human immunodeficiency virus infection)   . Hypertension   . Coronary artery disease     a. cath 04/02/2014 3v dx 80-90% in D1, 80-90% mid LAD, 95% apical LAD, 95% distal inferior LV branch of LCx, total occlusion of prox RCA  b). Cath 04/03/2014 DES to mid LAD, DES x3 to prox D1, DAPT indefinitely. LCx lesion untreated  . Stroke   . Cardiomyopathy  SEVERE    Past Surgical History  Procedure Laterality Date  . Cardiac catheterization  04/02/2014    DR KELLY    Allergies  Allergen Reactions  . Ivp Dye [Iodinated Diagnostic Agents] Hives    Current Outpatient Prescriptions  Medication Sig Dispense Refill  . aspirin 81 MG chewable tablet Chew 81 mg by mouth daily.      [name] atorvastatin (LIPITOR) 40 MG tablet Take 1 tablet (40 mg total) by mouth daily.   30 tablet  6  . clopidogrel (PLAVIX) 75 MG tablet Take 1 tablet (75 mg total) by mouth daily with breakfast.  30 tablet  0  . furosemide (LASIX) 20 MG tablet Take 1 tablet (20 mg total) by mouth daily as needed. Take one if weight increase by 3 lbs overnight or 5 lbs in 1 week  30 tablet  3  . ipratropium (ATROVENT) 0.06 % nasal spray Place 2 sprays into both nostrils 4 (four) times daily.  15 mL  1  . isosorbide mononitrate (IMDUR) 30 MG 24 hr tablet Take 1 tablet (30 mg total) by mouth daily.  30 tablet  6  . lisinopril (PRINIVIL,ZESTRIL) 20 MG tablet Take 20 mg by mouth daily.      [name] lopinavir-ritonavir (KALETRA) 200-50 MG per tablet Take 2 tablets by mouth 2 (two) times daily.      [name] loratadine (CLARITIN) 10 MG tablet Take 10 mg by mouth daily as needed for allergies.      . metoprolol (LOPRESSOR) 50 MG tablet Take 1 tablet (50 mg total) by mouth 2 (two) times daily.  180 tablet  3  . pantoprazole (PROTONIX) 40 MG tablet Take 1 tablet (40 mg total) by mouth daily as needed.  30 tablet  0  . predniSONE (DELTASONE) 20 MG tablet Take 3 tablets  At bedtime the night prior to procedure. Take 3 tablets the morning of the procedure.  6 tablet  0  . raltegravir (ISENTRESS) 400 MG tablet Take 400 mg by mouth 2 (two) times daily.      . risperiDONE (RISPERDAL) 2 MG tablet Take 1 mg by mouth 2 (two) times daily.       . traMADol (ULTRAM) 50 MG tablet Take 50 mg by mouth every 6 (six) hours as needed for moderate pain.       No current facility-administered medications for this visit.    Socially, he is married for 25 years.  Has one child.  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  He completed 12th grade education.  There is no tobacco use.  He does drink alcohol.  Family History  Problem Relation Age of Onset  . Heart disease Mother     ROS General: Negative; No fevers, chills, or night sweats HEENT: Negative; No changes in vision or hearing, sinus congestion, difficulty  swallowing Pulmonary: Negative; No cough, wheezing, shortness of breath, hemoptysis Cardiovascular:  See HPI; No chest pain, presyncope, syncope, palpitations, edema GI: Negative; No nausea, vomiting, diarrhea, or abdominal pain GU: Negative; No dysuria, hematuria, or difficulty voiding Musculoskeletal: Negative; no myalgias, joint pain, or weakness Hematologic/Oncologic: Positive for HIV well-controlled on chronic therapy for years; no easy bruising, bleeding Endocrine: Negative; no heat/cold intolerance; no diabetes Neuro: Negative; no changes in balance, headaches Skin: Negative; No rashes or skin lesions Psychiatric: History of depression. No behavioral problems,  Sleep: Negative; No daytime sleepiness, hypersomnolence, bruxism, restless legs, hypnogagnic hallucinations Other comprehensive 14 point system review is negative   Physical  Exam BP 110/60  Pulse 64  Ht 5' 9.5" (1.765 m)  Wt 184 lb 14.4 oz (83.87 kg)  BMI 26.92 kg/m2 General: Alert, oriented, no distress.  Skin: normal turgor, no rashes, warm and dry HEENT: Normocephalic, atraumatic. Pupils equal round and reactive to light; sclera anicteric; extraocular muscles intact; Fundi poorly visualized, but without hemorrhages or exudates. Nose without nasal septal hypertrophy Mouth/Parynx benign; Mallinpatti scale 2 Neck: No JVD, no carotid bruits; normal carotid upstroke Lungs: clear to ausculatation and percussion; no wheezing or rales Chest wall: without tenderness to palpitation Heart: PMI not displaced, RRR, s1 s2 normal, 1/6 systolic murmur, no diastolic murmur, no rubs, gallops, thrills, or heaves Abdomen: soft, nontender; no hepatosplenomehaly, BS+; abdominal aorta nontender and not dilated by palpation. Rectal exam performed by me: Stool guaiac-negative.  Prostate without nodules. Back: no CVA tenderness Pulses 2+ Musculoskeletal: full range of motion, normal strength, no joint deformities Extremities: no clubbing  cyanosis or edema, Homan's sign negative  Neurologic: grossly nonfocal; Cranial nerves grossly wnl Psychologic: Normal mood and affect  ECG (independently read by me): normal sinus rhythm at 64 beats per minute.  T-wave inversion V3 through V6,  lead 1, 2 and aVF.  Prior 02/20/2014 ECG (independently read by me): Sinus rhythm with frequent PVCs with transient trigeminy, and evidence for [facility] conduction with his ectopy.  LABS:  BMET    Component Value Date/Time   NA 135 04/23/2014 0807   K 4.3 04/23/2014 0807   CL 103 04/23/2014 0807   CO2 25 04/23/2014 0807   GLUCOSE 89 04/23/2014 0807   BUN 17 04/23/2014 0807   CREATININE 1.22 04/23/2014 0807   CREATININE 1.13 04/04/2014 0258   CALCIUM 9.1 04/23/2014 0807   GFRNONAA 71* 04/04/2014 0258   GFRAA 82* 04/04/2014 0258     Hepatic Function Panel     Component Value Date/Time   PROT 7.3 04/23/2014 0807   ALBUMIN 4.3 04/23/2014 0807   AST 20 04/23/2014 0807   ALT 25 04/23/2014 0807   ALKPHOS 75 04/23/2014 0807   BILITOT 0.9 04/23/2014 0807     CBC    Component Value Date/Time   WBC 3.5* 04/23/2014 0807   RBC 5.03 04/23/2014 0807   HGB 13.6 04/23/2014 0807   HCT 39.4 04/23/2014 0807   PLT 148* 04/23/2014 0807   MCV 78.3 04/23/2014 0807   MCH 27.0 04/23/2014 0807   MCHC 34.5 04/23/2014 0807   RDW 15.6* 04/23/2014 0807   LYMPHSABS 1.3 11/01/2012 2112   MONOABS 0.3 11/01/2012 2112   EOSABS 0.1 11/01/2012 2112   BASOSABS 0.0 11/01/2012 2112     BNP    Component Value Date/Time   PROBNP 123.0* 04/03/2007 0605    Lipid Panel     Component Value Date/Time   CHOL 189 04/23/2014 0807   TRIG 155* 04/23/2014 0807   HDL 29* 04/23/2014 0807   CHOLHDL 6.5 04/23/2014 0807   VLDL 31 04/23/2014 0807   LDLCALC 129* 04/23/2014 0807      RADIOLOGY: Dg Chest 2 View  02/04/2014   CLINICAL DATA:  Chest and left arm pain.  EXAM: CHEST  2 VIEW  COMPARISON:  None.  FINDINGS: The lungs are clear. Heart size is normal. No pneumothorax or pleural effusion. No  focal bony abnormality.  IMPRESSION: Negative chest.   Electronically Signed   By: Inge Rise M.D.   On: 02/04/2014 10:53     ASSESSMENT AND PLAN: Richard [name] is a 58 year old, African American male who suffered  a CVA in 2003.   He has a history of a car, myopathy, since 2003.  However, recently, there was development of new chest tightness and increasing shortness of breath.  At cardiac catheterization.  His ejection fraction was 15% and he underwent successful intervention to a diffusely diseased diagonal and LAD system.  His RCA is occluded and is collateralized.  He does have very distal circumflex stenosis.  He has noted dark stool, but stool guaiac is negative.  He is not having any signs of volume overload.  There is no edema.  I discontinued his Lasix.  He will continue to take the metoprolol 50 mg twice a day, lisinopril 20 mg daily in addition to nitrate therapy.  He is on his HIV medications.  I will recheck complete set of laboratory on his current medical regimen consisting of a CBC, CMP, TSH, and lipid panel.  Since he is on HIV therapy.  I will also check a CPK level with his concomitant statin treatment. I have recommended a followup echo Doppler study be obtained in 2 months to reassess any improvement of LV function following revascularization.   Troy Sine, MD, [facility] 04/23/2014 6:49 PM

## 2014-04-25 ENCOUNTER — Telehealth: Payer: Self-pay | Admitting: *Deleted

## 2014-04-25 MED ORDER — EZETIMIBE 10 MG PO TABS: 10.0000 mg | ORAL_TABLET | Freq: Every day | ORAL | Status: DC

## 2014-04-25 NOTE — Telephone Encounter (Signed)
Patient notified of lab results and recommendations. Zetia e-scribed to patient's pharmacy.

## 2014-04-25 NOTE — Telephone Encounter (Signed)
Message copied by Lauralee Evener on Thu Apr 25, 2014 10:47 AM ------      Message from: Shelva Majestic A      Created: Thu Apr 25, 2014  8:43 AM       Pt with HIV on meds. SP PCI; ischemic cardiomyopathy. LDL 129; Add zetia 10 mg to statin; would not further increase statin since on HIV meds. CPK nl. ------

## 2014-04-26 ENCOUNTER — Telehealth: Payer: Self-pay | Admitting: Cardiovascular Disease

## 2014-04-26 MED ORDER — EZETIMIBE 10 MG PO TABS: 10.0000 mg | ORAL_TABLET | Freq: Every day | ORAL | Status: DC

## 2014-04-26 NOTE — Telephone Encounter (Signed)
Please call,need to know the name of the medicine that Dr Claiborne Billings said he was going to start him on last week.

## 2014-04-26 NOTE — Telephone Encounter (Signed)
Needs paper prescription to take to [facility] to get medication. Patient will be in office on Monday 9/21 for echo. Will place Rx on Wanda's desk to have Dr. Claiborne Billings sign

## 2014-04-26 NOTE — Telephone Encounter (Signed)
LMTCB

## 2014-04-26 NOTE — Telephone Encounter (Signed)
Richard [name] was returning the nurse's call

## 2014-04-26 NOTE — Telephone Encounter (Signed)
Close encounter 

## 2014-04-29 ENCOUNTER — Ambulatory Visit (HOSPITAL_COMMUNITY)
Admission: RE | Admit: 2014-04-29 | Discharge: 2014-04-29 | Disposition: A | Discharge status: Home / Self Care | Payer: Medicare Other | Source: Ambulatory Visit | Attending: Cardiology | Admitting: Cardiology

## 2014-04-29 DIAGNOSIS — I2510 Atherosclerotic heart disease of native coronary artery without angina pectoris: Secondary | ICD-10-CM | POA: Diagnosis not present

## 2014-04-29 DIAGNOSIS — I10 Essential (primary) hypertension: Secondary | ICD-10-CM | POA: Insufficient documentation

## 2014-04-29 DIAGNOSIS — I255 Ischemic cardiomyopathy: Secondary | ICD-10-CM

## 2014-04-29 DIAGNOSIS — E785 Hyperlipidemia, unspecified: Secondary | ICD-10-CM | POA: Diagnosis not present

## 2014-04-29 DIAGNOSIS — I059 Rheumatic mitral valve disease, unspecified: Secondary | ICD-10-CM | POA: Diagnosis not present

## 2014-04-29 DIAGNOSIS — Z8673 Personal history of transient ischemic attack (TIA), and cerebral infarction without residual deficits: Secondary | ICD-10-CM

## 2014-04-29 DIAGNOSIS — Z8249 Family history of ischemic heart disease and other diseases of the circulatory system: Secondary | ICD-10-CM | POA: Insufficient documentation

## 2014-04-29 NOTE — Progress Notes (Signed)
2D Echocardiogram Complete.  04/29/2014   Cearra Portnoy Cary, RDCS

## 2014-04-30 ENCOUNTER — Telehealth: Payer: Self-pay | Admitting: Cardiovascular Disease

## 2014-04-30 NOTE — Telephone Encounter (Signed)
Please have Mariann Laster to call her,she says she needs some information. She needs this information so he can get his medicine from the [place].

## 2014-04-30 NOTE — Telephone Encounter (Signed)
LEFT MESSAGE THAT PATIENT NEEDS TO SIGN FOR MEDICAL RELEASE FOR LABS AND LAST OFFICE NOTE

## 2014-04-30 NOTE — Telephone Encounter (Signed)
SPOKE TO WIFE   SHE [facility] why he needs ZETIA.  RN Informed wife she would need to sign medical release to have copy of lab-lipid results from 02/20/14,04/22/14 SHE VERBALIZED UNDERSTANDING

## 2014-05-03 ENCOUNTER — Other Ambulatory Visit: Payer: Self-pay | Admitting: Physician Assistant

## 2014-05-13 ENCOUNTER — Telehealth: Payer: Self-pay | Admitting: Cardiovascular Disease

## 2014-05-13 NOTE — Telephone Encounter (Signed)
Spoke with pt, he was recently started on zetia. Okay given for patient to stop the zetia. Will make dr Claiborne Billings aware to see if he wants the patient to try anything else

## 2014-05-13 NOTE — Telephone Encounter (Signed)
Please call, his medicine is giving him a headache.He did not know of the medicine,he said it was one of the last ones that Dr Claiborne Billings started him on. He took Tylenol,still no relief.

## 2014-05-16 NOTE — Telephone Encounter (Signed)
Would not stop zetia. Suspect Imdur is causing the headache; decrease imdur dose by 1/2 to see if better; if not then dc imdur

## 2014-05-16 NOTE — Telephone Encounter (Signed)
Returned a call to patient informing him per Dr. Claiborne Billings to continue the Zetia. Cut his isosorbide into 1/2. Take 1/2 tablet daily.  Patient voiced verbal understanding of these orders.

## 2014-05-24 ENCOUNTER — Telehealth: Payer: Self-pay | Admitting: *Deleted

## 2014-05-24 NOTE — Telephone Encounter (Signed)
Returned cardiac rehab phase II form.

## 2014-06-04 DIAGNOSIS — H35033 Hypertensive retinopathy, bilateral: Secondary | ICD-10-CM | POA: Diagnosis not present

## 2014-06-04 DIAGNOSIS — Z23 Encounter for immunization: Secondary | ICD-10-CM | POA: Diagnosis not present

## 2014-06-04 DIAGNOSIS — H2513 Age-related nuclear cataract, bilateral: Secondary | ICD-10-CM | POA: Diagnosis not present

## 2014-06-04 DIAGNOSIS — H25031 Anterior subcapsular polar age-related cataract, right eye: Secondary | ICD-10-CM | POA: Diagnosis not present

## 2014-06-04 DIAGNOSIS — I10 Essential (primary) hypertension: Secondary | ICD-10-CM | POA: Diagnosis not present

## 2014-06-06 ENCOUNTER — Encounter (HOSPITAL_COMMUNITY)
Admission: RE | Admit: 2014-06-06 | Discharge: 2014-06-06 | Disposition: A | Discharge status: Home / Self Care | Payer: Medicare Other | Source: Ambulatory Visit | Attending: Cardiovascular Disease | Admitting: Cardiovascular Disease

## 2014-06-06 NOTE — Progress Notes (Signed)
Cardiac Rehab Medication Review by a Pharmacist  Does the patient  feel that his/her medications are working for him/her?  yes  Has the patient been experiencing any side effects to the medications prescribed?  Yes.   Experiencing headaches with ezetimibe, however, has resolved.    Does the patient measure his/her own blood pressure or blood glucose at home?  yes   Does the patient have any problems obtaining medications due to transportation or finances?   no  Understanding of regimen: excellent Understanding of indications: good Potential of compliance: good  Pharmacist comments: Richard [name] is a pleasant 58 year old man who appears very compliant with his medications.  He was aware of his medication regimen.  He checks is blood pressure every day with his home meter.    Regards,  Blossom Hoops 06/06/2014 8:36 AM

## 2014-06-10 ENCOUNTER — Encounter (HOSPITAL_COMMUNITY)
Admission: RE | Admit: 2014-06-10 | Discharge: 2014-06-10 | Disposition: A | Discharge status: Home / Self Care | Payer: Medicare Other | Source: Ambulatory Visit | Attending: Cardiovascular Disease | Admitting: Cardiovascular Disease

## 2014-06-10 ENCOUNTER — Encounter (HOSPITAL_COMMUNITY): Payer: Self-pay

## 2014-06-10 DIAGNOSIS — Z955 Presence of coronary angioplasty implant and graft: Secondary | ICD-10-CM | POA: Diagnosis not present

## 2014-06-10 NOTE — Progress Notes (Signed)
Pt started cardiac rehab today.  Pt tolerated light exercise without difficulty.  VSS, except hypotension, lowest BP-88/54 pre exercise, up to 115/60 with exercise and 95/63 post.  Asymptomatic.  Telemetry-sinus rhythm, negative QRS with frequent multifocal PVC with couplets.DR. Claiborne Billings notified of rhythm strip and BP results.    PHQ-1.  Pt reports effective depression symptom relief with risperdone.  Pt has effective coping skills, positive outlook and supportive family.  Pt enjoys watching sporting events on TV especially NFL football and college basketball.  Pt goals for cardiac rehab are to get stronger, improve circulation and stamina, and  learn heart healthy lifestyle.  Pt oriented to exercise equipment and routine.  Understanding verbalized.

## 2014-06-12 ENCOUNTER — Encounter (HOSPITAL_COMMUNITY): Payer: Medicare Other

## 2014-06-12 DIAGNOSIS — Z955 Presence of coronary angioplasty implant and graft: Secondary | ICD-10-CM | POA: Diagnosis not present

## 2014-06-17 ENCOUNTER — Encounter (HOSPITAL_COMMUNITY)
Admission: RE | Admit: 2014-06-17 | Discharge: 2014-06-17 | Disposition: A | Discharge status: Home / Self Care | Payer: Medicare Other | Source: Ambulatory Visit | Attending: Cardiovascular Disease | Admitting: Cardiovascular Disease

## 2014-06-17 DIAGNOSIS — Z955 Presence of coronary angioplasty implant and graft: Secondary | ICD-10-CM | POA: Diagnosis not present

## 2014-06-18 ENCOUNTER — Telehealth: Payer: Self-pay | Admitting: Cardiovascular Disease

## 2014-06-18 NOTE — Telephone Encounter (Signed)
Called patient's wife and advised that I would like to speak with him concerning BP. She asked me to call him at home at [phone]. Called patient at the home phone. He states that his BP has been running low when he has it checked at Rehab.  BP has been running 90/60 HR 70's. Patient states that he sometimes has positional dizziness. Currently taking Lisinopril 20mg  every day and Metoprolol 50mg  BID. States that the nurses at the rehab center think that he needs his medication dose changed. Advised will forward message to [facility] and call him back.

## 2014-06-18 NOTE — Telephone Encounter (Signed)
Please call, pt blood pressure have been low. The person working with him in [place] says it might be his blood pressure medicine

## 2014-06-19 ENCOUNTER — Telehealth: Payer: Self-pay | Admitting: Cardiovascular Disease

## 2014-06-19 ENCOUNTER — Encounter (HOSPITAL_COMMUNITY)
Admission: RE | Admit: 2014-06-19 | Discharge: 2014-06-19 | Disposition: A | Discharge status: Home / Self Care | Payer: Medicare Other | Source: Ambulatory Visit | Attending: Cardiovascular Disease | Admitting: Cardiovascular Disease

## 2014-06-19 DIAGNOSIS — Z955 Presence of coronary angioplasty implant and graft: Secondary | ICD-10-CM | POA: Diagnosis not present

## 2014-06-19 NOTE — Progress Notes (Signed)
PSYCHOSOCIAL ASSESSMENT  Pt psychosocial assessment reveals no barriers to rehab participation.  Pt quality of life is slightly altered by his physical constraints which limits his ability to perform tasks as prior to his illness. Pt cardiac health is concerning to him,however he is feeling more confident with cardiac  rehab participation.  Pt encouraged to utilize alternative forms of intimacy such as hand holding, cuddling, hugs to replace intercourse and maintain intimate relationship.  Pt exhibits positive coping skills and has supportive family.  Offered emotional support and reassurance.  Will continue to monitor.

## 2014-06-19 NOTE — Telephone Encounter (Signed)
Another encounter created on this patient yesterday 11/10 - documented in previously opened encounter and sent to Dr. Claiborne Billings

## 2014-06-19 NOTE — Telephone Encounter (Signed)
Spoke wth [place]. She reports patient had a a 4 beat run of "PVCs" "multifocal PVCs" and "abherrent beat" and wanted Dr. Claiborne Billings to review the strips. She will fax over this and rehab reports - including BPs - since patient was noted to be hypotensive. (see below). He is fine now - no symptoms. Joann reports he c/o occasional fatigue and some dizziness at home   Routed to Dr. Claiborne Billings & Mariann Laster to review and advise patient

## 2014-06-21 NOTE — Telephone Encounter (Signed)
Message has been sent to Dr. Claiborne Billings per triage nurse.

## 2014-06-23 NOTE — Telephone Encounter (Signed)
If BP low; decrease lisinopril to 10 mg daily.

## 2014-06-24 ENCOUNTER — Encounter (HOSPITAL_COMMUNITY)
Admission: RE | Admit: 2014-06-24 | Discharge: 2014-06-24 | Disposition: A | Discharge status: Home / Self Care | Payer: Medicare Other | Source: Ambulatory Visit | Attending: Cardiovascular Disease | Admitting: Cardiovascular Disease

## 2014-06-24 DIAGNOSIS — Z955 Presence of coronary angioplasty implant and graft: Secondary | ICD-10-CM | POA: Diagnosis not present

## 2014-06-24 NOTE — Telephone Encounter (Signed)
LMTCB

## 2014-06-24 NOTE — Progress Notes (Signed)
I have reviewed home exercise with Richard [name]. The patient was advised to walk 2-4 days per week outside of [place] for 15 minutes, 2 times per day until he can walk 30 minutes continuously.  Pt will also complete one additional day of hand weights outside of CRP II.  We also discussed potential gym memberships.  Progression of exercise prescription was discussed.  Reviewed THR, pulse, RPE, sign and symptoms, NTG use and when to call 911 or MD.  Pt voiced understanding. 1886  Archie Endo, MS, ACSM RCEP 06/24/2014 9:34 AM

## 2014-06-25 ENCOUNTER — Encounter: Payer: Self-pay | Admitting: Cardiovascular Disease

## 2014-06-25 ENCOUNTER — Ambulatory Visit (INDEPENDENT_AMBULATORY_CARE_PROVIDER_SITE_OTHER): Payer: Medicare Other | Admitting: Cardiovascular Disease

## 2014-06-25 VITALS — BP 84/60 | HR 67 | Ht 69.0 in | Wt 188.0 lb

## 2014-06-25 DIAGNOSIS — Z8673 Personal history of transient ischemic attack (TIA), and cerebral infarction without residual deficits: Secondary | ICD-10-CM | POA: Diagnosis not present

## 2014-06-25 DIAGNOSIS — B20 Human immunodeficiency virus [HIV] disease: Secondary | ICD-10-CM

## 2014-06-25 DIAGNOSIS — I2510 Atherosclerotic heart disease of native coronary artery without angina pectoris: Secondary | ICD-10-CM | POA: Diagnosis not present

## 2014-06-25 DIAGNOSIS — I200 Unstable angina: Secondary | ICD-10-CM

## 2014-06-25 DIAGNOSIS — I255 Ischemic cardiomyopathy: Secondary | ICD-10-CM

## 2014-06-25 DIAGNOSIS — E785 Hyperlipidemia, unspecified: Secondary | ICD-10-CM | POA: Diagnosis not present

## 2014-06-25 DIAGNOSIS — I10 Essential (primary) hypertension: Secondary | ICD-10-CM | POA: Diagnosis not present

## 2014-06-25 MED ORDER — LISINOPRIL 5 MG PO TABS: 5.0000 mg | ORAL_TABLET | Freq: Every day | ORAL | Status: DC

## 2014-06-25 MED ORDER — FUROSEMIDE 20 MG PO TABS: 20.0000 mg | ORAL_TABLET | Freq: Every day | ORAL | Status: DC | PRN

## 2014-06-25 MED ORDER — METOPROLOL TARTRATE 50 MG PO TABS: ORAL_TABLET | ORAL | Status: DC

## 2014-06-25 NOTE — Patient Instructions (Addendum)
Your physician has recommended you make the following change in your medication: the lisinopril has been decreased from 10 mg daily to 5 mg. A new prescription has been sent to your [facility] pharmacy. The lopressor has been changed to 1 pill in the morning ( 50 mg) and 25 mg on the PM ( 1/2 tablet).   You have been referred to Dr. Sallyanne Kuster for ICD evaluation.  Your physician recommends that you schedule a follow-up appointment in: 3 months with Dr. Claiborne Billings.

## 2014-06-26 ENCOUNTER — Encounter (HOSPITAL_COMMUNITY)
Admission: RE | Admit: 2014-06-26 | Discharge: 2014-06-26 | Disposition: A | Discharge status: Home / Self Care | Payer: Medicare Other | Source: Ambulatory Visit | Attending: Cardiovascular Disease | Admitting: Cardiovascular Disease

## 2014-06-26 DIAGNOSIS — Z955 Presence of coronary angioplasty implant and graft: Secondary | ICD-10-CM | POA: Diagnosis not present

## 2014-06-26 NOTE — Telephone Encounter (Signed)
Patient saw Dr. Claiborne Billings yesterday and medications were adjusted.

## 2014-06-27 ENCOUNTER — Encounter: Payer: Self-pay | Admitting: Cardiovascular Disease

## 2014-06-27 NOTE — Progress Notes (Signed)
Patient ID: Richard [name], male   DOB: 10-12-1955, 58 y.o.   MRN: [id number]    HPI:  Richard [name] is a 58 y.o. male who presents for follow-up cardiology evaluation.  Mr. Koyanagi suffered a CVA in 2003.  He has been followed at the [facility] hospital.   In 2003 he underwent cardiac catheterization which  which did not reveal significant coronary artery disease. Prior to catheterization he did have an ejection fraction of 33% with multiple areas of scar noted on a nuclear perfusion study.   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  He states that for the past year 5 years.  He has had undetected virus.  He has a history of hypertension, as well as hyperlipidemia.  There is also a history of depression, for which he takes Risperdal.  I initially saw him with complaints of some episodes of left-sided chest pain with walking, associated with left arm numbness and increasing shortness of breath with activity and is referred for nuclear study. He underwent a recent nuclear perfusion study on 03/01/2014.  Ejection fraction was about 27%.  This showed extensive scar multiple coronary territories without definitive reversible ischemia.  An echo Doppler study, which was done on 03/01/2014 showed an ejection fraction of 20-25% with severe global hypokinesis, but more pronounced inferior hypokinesis.  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  He had multivessel CAD with 80 and 90% stenoses in the first diagonal branch of the LAD, 80-90% LAD stenoses after the first septal perforating artery, and 95% apical LAD stenosis.  There is a 95% distal inferior LV branch stenosis of the distal circumflex and the RCA was totally occluded proximally with left to right collaterals.  He had normal right heart pressures and there was no evidence for  Polaner.  Hypertension.  He underwent surgical evaluation and was turned down.  The following day he underwent successful intervention to his diagonal and LAD by Dr. [place]az[place] with insertion of a 3.0x38 mm Promus DES stent in the mid LAD and 3 stents placed in the diagonal vessel.  He was discharged on 04/04/2014.  Since hospital discharge, he denies any recent chest tightness that he had experienced previously.  His exertional shortness of breath has improved.  He is unaware of any palpitations.  His blood pressure has been somewhat low.  He also has noticed some dark school, but denied any awareness of black or bloody stool.  Since I saw him in the office 2 months ago, he denies any significant episodes of chest pain.  He has been participating in rehabilitation.  His blood pressure has been low.  He is unaware of any tachycardia palpitations.  He has been on isosorbide 30 mg, lisinopril 10 mg, and metoprolol 50 mg twice a day.  He also has been taking dual antiplatelet therapy with aspirin and Plavix.  He is on Lipitor 40 mg and Zetia 10 mg for hyperlipidemia.  Past Medical History  Diagnosis Date  . HIV (human immunodeficiency virus infection)   . Hypertension   . Coronary artery disease     a. cath 04/02/2014 3v dx 80-90% in D1, 80-90% mid LAD, 95% apical LAD, 95% distal inferior LV branch of LCx, total occlusion of prox RCA  b). Cath 04/03/2014 DES to mid LAD, DES x3 to prox D1, DAPT indefinitely. LCx lesion untreated  .  Stroke   . Cardiomyopathy     SEVERE    Past Surgical History  Procedure Laterality Date  . Cardiac catheterization  04/02/2014    DR Levell Tavano    Allergies  Allergen Reactions  . Ivp Dye [Iodinated Diagnostic Agents] Hives    Current Outpatient Prescriptions  Medication Sig Dispense Refill  . aspirin 81 MG chewable tablet Chew 81 mg by mouth daily.    [name] atorvastatin (LIPITOR) 40 MG tablet Take 40 mg by mouth daily.    . clopidogrel (PLAVIX) 75 MG tablet Take 1 tablet (75  mg total) by mouth daily with breakfast. 30 tablet 0  . clotrimazole (LOTRIMIN) 1 % cream Apply 1 application topically as needed (For itching of foot).    . ezetimibe (ZETIA) 10 MG tablet Take 5 mg by mouth daily.     . furosemide (LASIX) 20 MG tablet Take 1 tablet (20 mg total) by mouth daily as needed. 30 tablet 3  . ipratropium (ATROVENT) 0.06 % nasal spray Place 2 sprays into both nostrils 4 (four) times daily as needed for rhinitis.    [name] isosorbide mononitrate (IMDUR) 30 MG 24 hr tablet Take 1 tablet (30 mg total) by mouth daily. 30 tablet 6  . lopinavir-ritonavir (KALETRA) 200-50 MG per tablet Take 2 tablets by mouth 2 (two) times daily.    [name] loratadine (CLARITIN) 10 MG tablet Take 10 mg by mouth daily as needed for allergies.    . metoprolol (LOPRESSOR) 50 MG tablet Take 1 tablet in the morning and 1/2 tablet at night 180 tablet 3  . pantoprazole (PROTONIX) 40 MG tablet Take 1 tablet (40 mg total) by mouth daily as needed. 30 tablet 0  . raltegravir (ISENTRESS) 400 MG tablet Take 400 mg by mouth 2 (two) times daily.    . risperiDONE (RISPERDAL) 2 MG tablet Take 2 mg by mouth daily.     [name] lisinopril (PRINIVIL,ZESTRIL) 5 MG tablet Take 1 tablet (5 mg total) by mouth daily. 90 tablet 3   No current facility-administered medications for this visit.    Socially, he is married for 25 years.  Has one child.  He is disabled and previously had worked as a Naval architect.  He completed 12th grade education.  There is no tobacco use.  He does drink alcohol.  Family History  Problem Relation Age of Onset  . Heart disease Mother     ROS General: Negative; No fevers, chills, or night sweats HEENT: Negative; No changes in vision or hearing, sinus congestion, difficulty swallowing Pulmonary: Negative; No cough, wheezing, shortness of breath, hemoptysis Cardiovascular:  See HPI; No chest pain, presyncope, syncope, palpitations, edema GI: Negative; No nausea, vomiting, diarrhea, or abdominal  pain GU: Negative; No dysuria, hematuria, or difficulty voiding Musculoskeletal: Negative; no myalgias, joint pain, or weakness Hematologic/Oncologic: Positive for HIV well-controlled on chronic therapy for years; no easy bruising, bleeding Endocrine: Negative; no heat/cold intolerance; no diabetes Neuro: Negative; no changes in balance, headaches Skin: Negative; No rashes or skin lesions Psychiatric: History of depression. No behavioral problems,  Sleep: Negative; No daytime sleepiness, hypersomnolence, bruxism, restless legs, hypnogagnic hallucinations Other comprehensive 14 point system review is negative   Physical Exam BP 84/60 mmHg  Pulse 67  Ht 5\' 9"  (1.753 m)  Wt 188 lb (85.276 kg)  BMI 27.75 kg/m2  Repeat blood pressure by me was 98/68 supine and 82/60 standing.  Pulse was regular in the 60s without significant orthostatic pulse rise General: Alert, oriented, no distress.  Skin:  normal turgor, no rashes, warm and dry HEENT: Normocephalic, atraumatic. Pupils equal round and reactive to light; sclera anicteric; extraocular muscles intact; Fundi poorly visualized, but without hemorrhages or exudates. Nose without nasal septal hypertrophy Mouth/Parynx benign; Mallinpatti scale 2 Neck: No JVD, no carotid bruits; normal carotid upstroke Lungs: clear to ausculatation and percussion; no wheezing or rales Chest wall: without tenderness to palpitation Heart: PMI not displaced, RRR, s1 s2 normal, 1/6 systolic murmur, no diastolic murmur, no rubs, gallops, thrills, or heaves Abdomen: soft, nontender; no hepatosplenomehaly, BS+; abdominal aorta nontender and not dilated by palpation. Rectal exam performed by me: Stool guaiac-negative.  Prostate without nodules. Back: no CVA tenderness Pulses 2+ Musculoskeletal: full range of motion, normal strength, no joint deformities Extremities: no clubbing cyanosis or edema, Homan's sign negative  Neurologic: grossly nonfocal; Cranial nerves  grossly wnl Psychologic: Normal mood and affect  ECG (independently read by me):  Normal sinus rhythm.  Probable old IMI.  T-wave changes V3 through V6.  ECG (independently read by me): normal sinus rhythm at 64 beats per minute.  T-wave inversion V3 through V6,  lead 1, 2 and aVF.  Prior 02/20/2014 ECG (independently read by me): Sinus rhythm with frequent PVCs with transient trigeminy, and evidence for VA conduction with his ectopy.  LABS:  BMET    Component Value Date/Time   NA 135 04/23/2014 0807   K 4.3 04/23/2014 0807   CL 103 04/23/2014 0807   CO2 25 04/23/2014 0807   GLUCOSE 89 04/23/2014 0807   BUN 17 04/23/2014 0807   CREATININE 1.22 04/23/2014 0807   CREATININE 1.13 04/04/2014 0258   CALCIUM 9.1 04/23/2014 0807   GFRNONAA 71* 04/04/2014 0258   GFRAA 82* 04/04/2014 0258     Hepatic Function Panel     Component Value Date/Time   PROT 7.3 04/23/2014 0807   ALBUMIN 4.3 04/23/2014 0807   AST 20 04/23/2014 0807   ALT 25 04/23/2014 0807   ALKPHOS 75 04/23/2014 0807   BILITOT 0.9 04/23/2014 0807     CBC    Component Value Date/Time   WBC 3.5* 04/23/2014 0807   RBC 5.03 04/23/2014 0807   HGB 13.6 04/23/2014 0807   HCT 39.4 04/23/2014 0807   PLT 148* 04/23/2014 0807   MCV 78.3 04/23/2014 0807   MCH 27.0 04/23/2014 0807   MCHC 34.5 04/23/2014 0807   RDW 15.6* 04/23/2014 0807   LYMPHSABS 1.3 11/01/2012 2112   MONOABS 0.3 11/01/2012 2112   EOSABS 0.1 11/01/2012 2112   BASOSABS 0.0 11/01/2012 2112     BNP    Component Value Date/Time   PROBNP 123.0* 04/03/2007 0605    Lipid Panel     Component Value Date/Time   CHOL 189 04/23/2014 0807   TRIG 155* 04/23/2014 0807   HDL 29* 04/23/2014 0807   CHOLHDL 6.5 04/23/2014 0807   VLDL 31 04/23/2014 0807   LDLCALC 129* 04/23/2014 0807      RADIOLOGY: Dg Chest 2 View  02/04/2014   CLINICAL DATA:  Chest and left arm pain.  EXAM: CHEST  2 VIEW  COMPARISON:  None.  FINDINGS: The lungs are clear. Heart  size is normal. No pneumothorax or pleural effusion. No focal bony abnormality.  IMPRESSION: Negative chest.   Electronically Signed   By: Drusilla Kanner M.D.   On: 02/04/2014 10:53     ASSESSMENT AND PLAN: Mr. Bowman Blazina is a 58 year old, African American male who suffered a CVA in 2003.   He has a history of  a documented cardiomyopathy since 2003.  However, recently, there was development of new chest tightness and increasing shortness of breath.  At cardiac catheterization his ejection fraction was 15% and he underwent successful intervention to a diffusely diseased diagonal and LAD system.  His RCA is occluded and is collateralized.  He does have very distal circumflex stenosis.  His now been several months since his revascularization.  An echo Doppler study on 04/29/2014 shows slight improvement in ejection fraction at 20-25%.  I did review blood work from 04/23/2014.  His total cholesterol is 189, LDL cholesterol 129.  He is on retroviral therapy for his HIV.  Recent liver function studies are normal.  He  Is on zetia 10 mg with atorvastatin 40 mg.  He is orthostatic today.  I am recommending further reduction of his lisinopril to 5 mg.  I'm also slightly reducing his metoprolol from 50 mg twice a day to 50 Milligan grams in the morning and 25 mg at night.  I am recommending referral to Dr. Royann Shivers for consideration of possible prophylactic ICD implantation in light of his long-standing cardiomyopathy without significant major improvement following revascularization.  I will see him in 3 months for follow-up evaluation with me.  Lennette Bihari, MD, [facility] 06/27/2014 4:46 PM

## 2014-07-01 ENCOUNTER — Encounter (HOSPITAL_COMMUNITY)
Admission: RE | Admit: 2014-07-01 | Discharge: 2014-07-01 | Disposition: A | Discharge status: Home / Self Care | Payer: Medicare Other | Source: Ambulatory Visit | Attending: Cardiovascular Disease | Admitting: Cardiovascular Disease

## 2014-07-01 DIAGNOSIS — Z955 Presence of coronary angioplasty implant and graft: Secondary | ICD-10-CM | POA: Diagnosis not present

## 2014-07-01 NOTE — Progress Notes (Signed)
Richard [name] 58 y.o. male Nutrition Note Spoke with pt.  Nutrition Plan and Nutrition Survey goals reviewed with pt. Pt is following Step 2 of the Therapeutic Lifestyle Changes diet. Pt states he watches sodium intake carefully; choosing mostly frozen vegetables and "looking at the sodium content on food labels." Pt expressed understanding of the information reviewed. Pt aware of nutrition education classes offered.  Nutrition Diagnosis ? Food-and nutrition-related knowledge deficit related to lack of exposure to information as related to diagnosis of: ? CVD   Nutrition RX/ Estimated Daily Nutrition Needs for: wt maintenance 2350-2700 Kcal, 75-90 gm fat, 15-18 gm sat fat, 2.3-2.7 gm trans-fat, <1500 mg sodium  Nutrition Intervention ? Pt's individual nutrition plan reviewed with pt. ? Benefits of adopting Therapeutic Lifestyle Changes discussed when Medficts reviewed. ? Pt to attend the Portion Distortion class ? Pt to attend the  ? Nutrition I class                     ? Nutrition II class ? Continue client-centered nutrition education by RD, as part of interdisciplinary care. Goal(s) ? Pt to describe the benefit of including fruits, vegetables, whole grains, and low-fat dairy products in a heart healthy meal plan. Monitor and Evaluate progress toward nutrition goal with team. Nutrition Risk: Change to Moderate Derek Mound, M.Ed, RD, LDN, CDE 07/01/2014 9:26 AM

## 2014-07-03 ENCOUNTER — Encounter (HOSPITAL_COMMUNITY): Payer: Self-pay

## 2014-07-03 ENCOUNTER — Encounter (HOSPITAL_COMMUNITY)
Admission: RE | Admit: 2014-07-03 | Discharge: 2014-07-03 | Disposition: A | Discharge status: Home / Self Care | Payer: Medicare Other | Source: Ambulatory Visit | Attending: Cardiovascular Disease | Admitting: Cardiovascular Disease

## 2014-07-03 DIAGNOSIS — Z955 Presence of coronary angioplasty implant and graft: Secondary | ICD-10-CM | POA: Diagnosis not present

## 2014-07-03 NOTE — Progress Notes (Signed)
Pt exited  from cardiac rehab program today with completion of 8 exercise sessions in Phase II.  Early completion due to insurance copay cost.   Pt maintained good attendance and progressed nicely during his participation in rehab as evidenced by increased MET level.  Pt also had active participation with education classes.   Medication list reconciled. Repeat  PHQ2score- 0 .  Pt has made significant lifestyle changes and should be commended for his success. Pt feels he has achieved his goals during cardiac rehab of increased stamina and decreased exertional dyspnea.   Pt plans to continue exercising on his own at [employer].

## 2014-07-18 ENCOUNTER — Encounter (HOSPITAL_COMMUNITY): Payer: Self-pay | Admitting: Cardiovascular Disease

## 2014-08-06 ENCOUNTER — Telehealth: Payer: Self-pay | Admitting: Cardiovascular Disease

## 2014-08-06 ENCOUNTER — Encounter: Payer: Self-pay | Admitting: Cardiovascular Disease

## 2014-08-06 NOTE — Telephone Encounter (Signed)
Please call,question about his Metoprolol and Isosorbide.

## 2014-08-06 NOTE — Telephone Encounter (Signed)
Pt called to clarify how to take metoprolol and isosorbide. Question about frequency and dose of medications was addressed based on last OV note. Pt stated understanding, no other concerns.

## 2014-08-09 DIAGNOSIS — I5020 Unspecified systolic (congestive) heart failure: Secondary | ICD-10-CM | POA: Insufficient documentation

## 2014-08-21 ENCOUNTER — Ambulatory Visit (INDEPENDENT_AMBULATORY_CARE_PROVIDER_SITE_OTHER): Payer: Medicare Other | Admitting: Cardiovascular Disease

## 2014-08-21 ENCOUNTER — Encounter: Payer: Self-pay | Admitting: Cardiovascular Disease

## 2014-08-21 VITALS — BP 104/78 | HR 66 | Resp 16 | Ht 69.5 in | Wt 188.6 lb

## 2014-08-21 DIAGNOSIS — Z9189 Other specified personal risk factors, not elsewhere classified: Secondary | ICD-10-CM

## 2014-08-21 DIAGNOSIS — I255 Ischemic cardiomyopathy: Secondary | ICD-10-CM | POA: Diagnosis not present

## 2014-08-21 DIAGNOSIS — Z7901 Long term (current) use of anticoagulants: Secondary | ICD-10-CM | POA: Diagnosis not present

## 2014-08-21 DIAGNOSIS — Z79899 Other long term (current) drug therapy: Secondary | ICD-10-CM

## 2014-08-21 DIAGNOSIS — I2510 Atherosclerotic heart disease of native coronary artery without angina pectoris: Secondary | ICD-10-CM

## 2014-08-21 DIAGNOSIS — R5383 Other fatigue: Secondary | ICD-10-CM

## 2014-08-21 NOTE — Patient Instructions (Signed)
Your physician has recommended that you have a defibrillator inserted [place]. An implantable cardioverter defibrillator (ICD) is a small device that is placed in your chest or, in rare cases, your abdomen. This device uses electrical pulses or shocks to help control life-threatening, irregular heartbeats that could lead the heart to suddenly stop beating (sudden cardiac arrest). Leads are attached to the ICD that goes into your heart. This is done in the hospital and usually requires an overnight stay. Please see the instruction sheet given to you today for more information.  Your physician recommends that you return for lab work in: 3-7 days prior to the above procedure at [facility].

## 2014-08-22 DIAGNOSIS — Z9189 Other specified personal risk factors, not elsewhere classified: Secondary | ICD-10-CM | POA: Insufficient documentation

## 2014-08-22 NOTE — Progress Notes (Signed)
Patient ID: Richard [name], male   DOB: 1956/06/26, 59 y.o.   MRN: [id number]     Reason for office visit Discuss ICD  He underwent a nuclear perfusion study on 03/01/2014. Ejection fraction was 27% with extensive scar in multiple coronary territories without definitive reversible ischemia. An echo Doppler study, which was done on 03/01/2014 showed an ejection fraction of 20-25% with severe global hypokinesis, but more pronounced inferior hypokinesis.  He underwent cardiac catheterization by me on 04/02/2014 and was found to have ejection fraction of approximately 15%. He had multivessel CAD with 80 and 90% stenoses in the first diagonal branch of the LAD, 80-90% LAD stenoses after the first septal perforating artery, and 95% apical LAD stenosis. There is a 95% distal inferior LV branch stenosis of the distal circumflex and the RCA was totally occluded proximally with left to right collaterals. He had normal right heart pressures. He underwent surgical evaluation and was turned down. The following day he underwent successful intervention to his diagonal and LAD by Dr. Martinique with insertion of a 3.0x38 mm Promus DES stent in the mid LAD and 3 stents placed in the diagonal vessel. He was discharged on 04/04/2014.  He has been on isosorbide 30 mg, lisinopril 10 mg, and metoprolol 50 mg twice a day. He also has been taking dual antiplatelet therapy with aspirin and Plavix. He is on Lipitor 40 mg and Zetia 10 mg for hyperlipidemia.  Repeat echo was performed on 04/29/2014. As anticipated (due to absence of reversibility on perfusion study) there was little change in LV function.The estimated ejection fraction was in the range of 20% to 25%. Diffuse hypokinesis, akinesis ofthe anteroseptal and inferoseptal myocardium.  He has little in the way of CHF symptoms, NYHA class II. He requires a very low dose of loop diuretic.  Major comorbid conditions include HIV on antiretrovirals (reports  undetectable virus for last 5 years), treated hyperlipidemia  Allergies  Allergen Reactions  . Ivp Dye [Iodinated Diagnostic Agents] Hives    Current Outpatient Prescriptions  Medication Sig Dispense Refill  . aspirin 81 MG chewable tablet Chew 81 mg by mouth daily.    [name] atorvastatin (LIPITOR) 40 MG tablet Take 40 mg by mouth daily.    . clopidogrel (PLAVIX) 75 MG tablet Take 1 tablet (75 mg total) by mouth daily with breakfast. 30 tablet 0  . clotrimazole (LOTRIMIN) 1 % cream Apply 1 application topically as needed (For itching of foot).    . ezetimibe (ZETIA) 10 MG tablet Take 5 mg by mouth daily.     . furosemide (LASIX) 20 MG tablet Take 1 tablet (20 mg total) by mouth daily as needed. 30 tablet 3  . ipratropium (ATROVENT) 0.06 % nasal spray Place 2 sprays into both nostrils 4 (four) times daily as needed for rhinitis.    [name] isosorbide mononitrate (IMDUR) 30 MG 24 hr tablet Take 1 tablet (30 mg total) by mouth daily. 30 tablet 6  . lisinopril (PRINIVIL,ZESTRIL) 5 MG tablet Take 1 tablet (5 mg total) by mouth daily. 90 tablet 3  . lopinavir-ritonavir (KALETRA) 200-50 MG per tablet Take 2 tablets by mouth 2 (two) times daily.    [name] loratadine (CLARITIN) 10 MG tablet Take 10 mg by mouth daily as needed for allergies.    . metoprolol (LOPRESSOR) 50 MG tablet Take 1 tablet in the morning and 1/2 tablet at night 180 tablet 3  . pantoprazole (PROTONIX) 40 MG tablet Take 1 tablet (40 mg total) by mouth daily  as needed. 30 tablet 0  . raltegravir (ISENTRESS) 400 MG tablet Take 400 mg by mouth 2 (two) times daily.    . risperiDONE (RISPERDAL) 2 MG tablet Take 2 mg by mouth daily.      No current facility-administered medications for this visit.    Past Medical History  Diagnosis Date  . HIV (human immunodeficiency virus infection)   . Hypertension   . Coronary artery disease     a. cath 04/02/2014 3v dx 80-90% in D1, 80-90% mid LAD, 95% apical LAD, 95% distal inferior LV branch of LCx, total  occlusion of prox RCA  b). Cath 04/03/2014 DES to mid LAD, DES x3 to prox D1, DAPT indefinitely. LCx lesion untreated  . Stroke   . Cardiomyopathy     SEVERE    Past Surgical History  Procedure Laterality Date  . Cardiac catheterization  04/02/2014    DR KELLY  . Left and right heart catheterization with coronary angiogram N/A 04/02/2014    Procedure: LEFT AND RIGHT HEART CATHETERIZATION WITH CORONARY ANGIOGRAM;  Surgeon: Troy Sine, MD;  Location: [facility] CATH LAB;  Service: Cardiovascular;  Laterality: N/A;  . Percutaneous coronary stent intervention (pci-s) N/A 04/03/2014    Procedure: PERCUTANEOUS CORONARY STENT INTERVENTION (PCI-S);  Surgeon: Peter M Martinique, MD;  Location: [facility] CATH LAB;  Service: Cardiovascular;  Laterality: N/A;    Family History  Problem Relation Age of Onset  . Heart disease Mother     History   Social History  . Marital Status: Married    Spouse Name: N/A    Number of Children: N/A  . Years of Education: N/A   Occupational History  . Not on file.   Social History Main Topics  . Smoking status: Never Smoker   . Smokeless tobacco: Never Used  . Alcohol Use: No  . Drug Use: No  . Sexual Activity: Not Currently   Other Topics Concern  . Not on file   Social History Narrative    Review of systems: The patient specifically denies any chest pain at rest or with exertion, dyspnea at rest or with usual exertion, orthopnea, paroxysmal nocturnal dyspnea, syncope, palpitations, focal neurological deficits, intermittent claudication, lower extremity edema, unexplained weight gain, cough, hemoptysis or wheezing.  The patient also denies abdominal pain, nausea, vomiting, dysphagia, diarrhea, constipation, polyuria, polydipsia, dysuria, hematuria, frequency, urgency, abnormal bleeding or bruising, fever, chills, unexpected weight changes, mood swings, change in skin or hair texture, change in voice quality, auditory or visual problems, allergic reactions or rashes,  new musculoskeletal complaints other than usual "aches and pains".   PHYSICAL EXAM BP 104/78 mmHg  Pulse 66  Resp 16  Ht 5' 9.5" (1.765 m)  Wt 188 lb 9.6 oz (85.548 kg)  BMI 27.46 kg/m2  General: Alert, oriented x3, no distress Head: no evidence of trauma, PERRL, EOMI, no exophtalmos or lid lag, no myxedema, no xanthelasma; normal ears, nose and oropharynx Neck: normal jugular venous pulsations and no hepatojugular reflux; brisk carotid pulses without delay and no carotid bruits Chest: clear to auscultation, no signs of consolidation by percussion or palpation, normal fremitus, symmetrical and full respiratory excursions Cardiovascular: normal position and quality of the apical impulse, regular rhythm, normal first and second heart sounds, no murmurs, rubs or gallops Abdomen: no tenderness or distention, no masses by palpation, no abnormal pulsatility or arterial bruits, normal bowel sounds, no hepatosplenomegaly Extremities: no clubbing, cyanosis or edema; 2+ radial, ulnar and brachial pulses bilaterally; 2+ right femoral, posterior tibial and  dorsalis pedis pulses; 2+ left femoral, posterior tibial and dorsalis pedis pulses; no subclavian or femoral bruits Neurological: grossly nonfocal   EKG: NSR, one PVC, Inverted T waves V3-V6, I, aVL  Lipid Panel     Component Value Date/Time   CHOL 189 04/23/2014 0807   TRIG 155* 04/23/2014 0807   HDL 29* 04/23/2014 0807   CHOLHDL 6.5 04/23/2014 0807   VLDL 31 04/23/2014 0807   LDLCALC 129* 04/23/2014 0807    BMET    Component Value Date/Time   NA 135 04/23/2014 0807   K 4.3 04/23/2014 0807   CL 103 04/23/2014 0807   CO2 25 04/23/2014 0807   GLUCOSE 89 04/23/2014 0807   BUN 17 04/23/2014 0807   CREATININE 1.22 04/23/2014 0807   CREATININE 1.13 04/04/2014 0258   CALCIUM 9.1 04/23/2014 0807   GFRNONAA 71* 04/04/2014 0258   GFRAA 82* 04/04/2014 0258     ASSESSMENT AND PLAN  Mr. Allman meets primary prevention ICD  implantation criteria for ischemic cardiomyopathy (Prior myocardial infarction, left ventricular ejection fraction under 35%, heart failure NYHA class II, on comprehensive medical therapy, >90 days since last revascularization). His HIV infection (now about 20 years since infection) is well controlled.  Discussed the risk of SCD, purpose of AICD, pros/cons, possible procedure related and long term complications in detail, including infection, pneumothorax, perforation, need for further procedures, death, unnecessary shocks, hardware failure, potential future recalls, etc.  This procedure has been fully reviewed with the patient and informed consent has been obtained.  Plan to continue aspirin and clopidogrel throughout the procedure (DES 5 months ago) which may increase bleeding risk.  Orders Placed This Encounter  Procedures  . APTT  . CBC  . Comprehensive metabolic panel  . Protime-INR  . EKG 12-Lead  . IMPLANTABLE CARDIOVERTER DEFIBRILLATOR IMPLANT   No orders of the defined types were placed in this encounter.    Holli Humbles, MD, [place] [phone] office [phone] pager

## 2014-08-23 ENCOUNTER — Other Ambulatory Visit: Payer: Self-pay | Admitting: *Deleted

## 2014-08-23 DIAGNOSIS — I255 Ischemic cardiomyopathy: Secondary | ICD-10-CM

## 2014-09-04 DIAGNOSIS — R5383 Other fatigue: Secondary | ICD-10-CM | POA: Diagnosis not present

## 2014-09-04 DIAGNOSIS — Z79899 Other long term (current) drug therapy: Secondary | ICD-10-CM | POA: Diagnosis not present

## 2014-09-04 DIAGNOSIS — Z7901 Long term (current) use of anticoagulants: Secondary | ICD-10-CM | POA: Diagnosis not present

## 2014-09-05 LAB — COMPREHENSIVE METABOLIC PANEL
ALBUMIN: 4.2 g/dL (ref 3.5–5.2)
ALK PHOS: 61 U/L (ref 39–117)
ALT: 22 U/L (ref 0–53)
AST: 18 U/L (ref 0–37)
BILIRUBIN TOTAL: 0.8 mg/dL (ref 0.2–1.2)
BUN: 14 mg/dL (ref 6–23)
CHLORIDE: 105 meq/L (ref 96–112)
CO2: 24 meq/L (ref 19–32)
CREATININE: 1.17 mg/dL (ref 0.50–1.35)
Calcium: 8.9 mg/dL (ref 8.4–10.5)
Glucose, Bld: 84 mg/dL (ref 70–99)
POTASSIUM: 4 meq/L (ref 3.5–5.3)
Sodium: 137 mEq/L (ref 135–145)
Total Protein: 7.1 g/dL (ref 6.0–8.3)

## 2014-09-05 LAB — CBC
HCT: 41.6 % (ref 39.0–52.0)
HEMOGLOBIN: 13.7 g/dL (ref 13.0–17.0)
MCH: 26.7 pg (ref 26.0–34.0)
MCHC: 32.9 g/dL (ref 30.0–36.0)
MCV: 80.9 fL (ref 78.0–100.0)
MPV: 10.7 fL (ref 8.6–12.4)
PLATELETS: 141 10*3/uL — AB (ref 150–400)
RBC: 5.14 MIL/uL (ref 4.22–5.81)
RDW: 15.6 % — ABNORMAL HIGH (ref 11.5–15.5)
WBC: 3.1 10*3/uL — ABNORMAL LOW (ref 4.0–10.5)

## 2014-09-05 LAB — PROTIME-INR
INR: 1.07 (ref <1.50)
PROTHROMBIN TIME: 13.9 s (ref 11.6–15.2)

## 2014-09-05 LAB — APTT: aPTT: 29 seconds (ref 24–37)

## 2014-09-09 DIAGNOSIS — Z21 Asymptomatic human immunodeficiency virus [HIV] infection status: Secondary | ICD-10-CM | POA: Diagnosis not present

## 2014-09-09 DIAGNOSIS — I255 Ischemic cardiomyopathy: Secondary | ICD-10-CM | POA: Diagnosis not present

## 2014-09-09 DIAGNOSIS — Z79899 Other long term (current) drug therapy: Secondary | ICD-10-CM | POA: Diagnosis not present

## 2014-09-09 DIAGNOSIS — Z7982 Long term (current) use of aspirin: Secondary | ICD-10-CM | POA: Diagnosis not present

## 2014-09-09 DIAGNOSIS — I2510 Atherosclerotic heart disease of native coronary artery without angina pectoris: Secondary | ICD-10-CM | POA: Diagnosis not present

## 2014-09-09 DIAGNOSIS — Z91041 Radiographic dye allergy status: Secondary | ICD-10-CM | POA: Diagnosis not present

## 2014-09-09 DIAGNOSIS — E785 Hyperlipidemia, unspecified: Secondary | ICD-10-CM | POA: Diagnosis not present

## 2014-09-09 DIAGNOSIS — I2582 Chronic total occlusion of coronary artery: Secondary | ICD-10-CM | POA: Diagnosis not present

## 2014-09-09 DIAGNOSIS — I10 Essential (primary) hypertension: Secondary | ICD-10-CM | POA: Diagnosis not present

## 2014-09-09 DIAGNOSIS — Z955 Presence of coronary angioplasty implant and graft: Secondary | ICD-10-CM | POA: Diagnosis not present

## 2014-09-09 DIAGNOSIS — Z8673 Personal history of transient ischemic attack (TIA), and cerebral infarction without residual deficits: Secondary | ICD-10-CM | POA: Diagnosis not present

## 2014-09-09 DIAGNOSIS — Z7902 Long term (current) use of antithrombotics/antiplatelets: Secondary | ICD-10-CM | POA: Diagnosis not present

## 2014-09-09 MED ORDER — SODIUM CHLORIDE 0.9 % IR SOLN
80.0000 mg | Status: DC
  Filled 2014-09-09: qty 2

## 2014-09-09 MED ORDER — MUPIROCIN 2 % EX OINT
TOPICAL_OINTMENT | Freq: Two times a day (BID) | CUTANEOUS | Status: DC
  Administered 2014-09-10: 20:00:00 via NASAL
  Filled 2014-09-09: qty 22

## 2014-09-09 MED ORDER — SODIUM CHLORIDE 0.9 % IV SOLN
INTRAVENOUS | Status: DC
  Administered 2014-09-10: 12:00:00 via INTRAVENOUS

## 2014-09-09 MED ORDER — SODIUM CHLORIDE 0.9 % IJ SOLN: 3.0000 mL | INTRAMUSCULAR | Status: DC | PRN

## 2014-09-09 MED ORDER — CEFAZOLIN SODIUM-DEXTROSE 2-3 GM-% IV SOLR: 2.0000 g | INTRAVENOUS | Status: DC

## 2014-09-10 ENCOUNTER — Encounter (HOSPITAL_COMMUNITY)
Admission: RE | Disposition: A | Discharge status: Home / Self Care | Payer: Self-pay | Source: Ambulatory Visit | Attending: Cardiovascular Disease

## 2014-09-10 ENCOUNTER — Ambulatory Visit (HOSPITAL_COMMUNITY)
Admission: RE | Admit: 2014-09-10 | Discharge: 2014-09-11 | Disposition: A | Discharge status: Home / Self Care | Payer: Medicare Other | Source: Ambulatory Visit | Attending: Cardiovascular Disease | Admitting: Cardiovascular Disease

## 2014-09-10 ENCOUNTER — Encounter (HOSPITAL_COMMUNITY): Payer: Self-pay | Admitting: *Deleted

## 2014-09-10 DIAGNOSIS — I2510 Atherosclerotic heart disease of native coronary artery without angina pectoris: Secondary | ICD-10-CM | POA: Insufficient documentation

## 2014-09-10 DIAGNOSIS — E785 Hyperlipidemia, unspecified: Secondary | ICD-10-CM | POA: Diagnosis not present

## 2014-09-10 DIAGNOSIS — I255 Ischemic cardiomyopathy: Secondary | ICD-10-CM | POA: Diagnosis not present

## 2014-09-10 DIAGNOSIS — I2582 Chronic total occlusion of coronary artery: Secondary | ICD-10-CM | POA: Diagnosis not present

## 2014-09-10 DIAGNOSIS — Z91041 Radiographic dye allergy status: Secondary | ICD-10-CM | POA: Diagnosis not present

## 2014-09-10 DIAGNOSIS — Z7902 Long term (current) use of antithrombotics/antiplatelets: Secondary | ICD-10-CM | POA: Insufficient documentation

## 2014-09-10 DIAGNOSIS — Z9189 Other specified personal risk factors, not elsewhere classified: Secondary | ICD-10-CM

## 2014-09-10 DIAGNOSIS — Z955 Presence of coronary angioplasty implant and graft: Secondary | ICD-10-CM | POA: Insufficient documentation

## 2014-09-10 DIAGNOSIS — I10 Essential (primary) hypertension: Secondary | ICD-10-CM | POA: Insufficient documentation

## 2014-09-10 DIAGNOSIS — Z21 Asymptomatic human immunodeficiency virus [HIV] infection status: Secondary | ICD-10-CM | POA: Insufficient documentation

## 2014-09-10 DIAGNOSIS — Z79899 Other long term (current) drug therapy: Secondary | ICD-10-CM | POA: Insufficient documentation

## 2014-09-10 DIAGNOSIS — Z95810 Presence of automatic (implantable) cardiac defibrillator: Secondary | ICD-10-CM | POA: Diagnosis present

## 2014-09-10 DIAGNOSIS — Z7982 Long term (current) use of aspirin: Secondary | ICD-10-CM | POA: Insufficient documentation

## 2014-09-10 DIAGNOSIS — Z8673 Personal history of transient ischemic attack (TIA), and cerebral infarction without residual deficits: Secondary | ICD-10-CM | POA: Insufficient documentation

## 2014-09-10 HISTORY — PX: IMPLANTABLE CARDIOVERTER DEFIBRILLATOR IMPLANT: SHX5473

## 2014-09-10 HISTORY — DX: Ischemic cardiomyopathy: I25.5

## 2014-09-10 LAB — SURGICAL PCR SCREEN
MRSA, PCR: NEGATIVE
STAPHYLOCOCCUS AUREUS: NEGATIVE

## 2014-09-10 SURGERY — IMPLANTABLE CARDIOVERTER DEFIBRILLATOR IMPLANT

## 2014-09-10 MED ORDER — CLOTRIMAZOLE 1 % EX CREA
1.0000 "application " | TOPICAL_CREAM | CUTANEOUS | Status: DC | PRN
  Filled 2014-09-10: qty 15

## 2014-09-10 MED ORDER — EZETIMIBE 10 MG PO TABS
5.0000 mg | ORAL_TABLET | Freq: Every day | ORAL | Status: DC
  Administered 2014-09-11: 5 mg via ORAL
  Filled 2014-09-10: qty 0.5

## 2014-09-10 MED ORDER — MIDAZOLAM HCL 5 MG/5ML IJ SOLN
INTRAMUSCULAR | Status: AC
  Filled 2014-09-10: qty 5

## 2014-09-10 MED ORDER — SODIUM CHLORIDE 0.9 % IV SOLN: INTRAVENOUS | Status: AC

## 2014-09-10 MED ORDER — HYDROCODONE-ACETAMINOPHEN 5-325 MG PO TABS: 1.0000 | ORAL_TABLET | ORAL | Status: DC | PRN

## 2014-09-10 MED ORDER — CEFAZOLIN SODIUM 1-5 GM-% IV SOLN
1.0000 g | Freq: Four times a day (QID) | INTRAVENOUS | Status: AC
  Administered 2014-09-10 – 2014-09-11 (×3): 1 g via INTRAVENOUS
  Filled 2014-09-10 (×3): qty 50

## 2014-09-10 MED ORDER — ISOSORBIDE MONONITRATE ER 30 MG PO TB24
30.0000 mg | ORAL_TABLET | Freq: Every day | ORAL | Status: DC
Start: 2014-09-11 — End: 2014-09-11
  Administered 2014-09-11: 30 mg via ORAL
  Filled 2014-09-10: qty 1

## 2014-09-10 MED ORDER — ACETAMINOPHEN 325 MG PO TABS: 325.0000 mg | ORAL_TABLET | ORAL | Status: DC | PRN

## 2014-09-10 MED ORDER — CLOPIDOGREL BISULFATE 75 MG PO TABS
75.0000 mg | ORAL_TABLET | Freq: Every day | ORAL | Status: DC
  Administered 2014-09-11: 75 mg via ORAL
  Filled 2014-09-10 (×2): qty 1

## 2014-09-10 MED ORDER — METOPROLOL TARTRATE 25 MG PO TABS: 25.0000 mg | ORAL_TABLET | Freq: Two times a day (BID) | ORAL | Status: DC

## 2014-09-10 MED ORDER — HEPARIN (PORCINE) IN NACL 2-0.9 UNIT/ML-% IJ SOLN
INTRAMUSCULAR | Status: AC
  Filled 2014-09-10: qty 500

## 2014-09-10 MED ORDER — RALTEGRAVIR POTASSIUM 400 MG PO TABS
400.0000 mg | ORAL_TABLET | Freq: Two times a day (BID) | ORAL | Status: DC
  Administered 2014-09-10 – 2014-09-11 (×2): 400 mg via ORAL
  Filled 2014-09-10 (×4): qty 1

## 2014-09-10 MED ORDER — PANTOPRAZOLE SODIUM 40 MG PO TBEC
40.0000 mg | DELAYED_RELEASE_TABLET | Freq: Every day | ORAL | Status: DC
  Administered 2014-09-11: 40 mg via ORAL
  Filled 2014-09-10: qty 1

## 2014-09-10 MED ORDER — MUPIROCIN 2 % EX OINT
TOPICAL_OINTMENT | CUTANEOUS | Status: AC
  Administered 2014-09-10: 1
  Filled 2014-09-10: qty 22

## 2014-09-10 MED ORDER — LORATADINE 10 MG PO TABS
10.0000 mg | ORAL_TABLET | Freq: Every day | ORAL | Status: DC | PRN
  Filled 2014-09-10: qty 1

## 2014-09-10 MED ORDER — FENTANYL CITRATE 0.05 MG/ML IJ SOLN
INTRAMUSCULAR | Status: AC
  Filled 2014-09-10: qty 2

## 2014-09-10 MED ORDER — LOPINAVIR-RITONAVIR 200-50 MG PO TABS
2.0000 | ORAL_TABLET | Freq: Two times a day (BID) | ORAL | Status: DC
  Administered 2014-09-10 – 2014-09-11 (×2): 2 via ORAL
  Filled 2014-09-10 (×4): qty 2

## 2014-09-10 MED ORDER — RISPERIDONE 2 MG PO TABS
2.0000 mg | ORAL_TABLET | Freq: Every day | ORAL | Status: DC
  Administered 2014-09-11: 2 mg via ORAL
  Filled 2014-09-10: qty 1

## 2014-09-10 MED ORDER — LISINOPRIL 10 MG PO TABS
10.0000 mg | ORAL_TABLET | Freq: Every day | ORAL | Status: DC
  Administered 2014-09-11: 10 mg via ORAL
  Filled 2014-09-10: qty 1

## 2014-09-10 MED ORDER — IPRATROPIUM BROMIDE 0.06 % NA SOLN
2.0000 | Freq: Four times a day (QID) | NASAL | Status: DC | PRN
  Filled 2014-09-10: qty 15

## 2014-09-10 MED ORDER — ASPIRIN 81 MG PO CHEW
81.0000 mg | CHEWABLE_TABLET | Freq: Every day | ORAL | Status: DC
  Administered 2014-09-11: 81 mg via ORAL
  Filled 2014-09-10: qty 1

## 2014-09-10 MED ORDER — EMTRICITABINE-TENOFOVIR DF 200-300 MG PO TABS
1.0000 | ORAL_TABLET | Freq: Every day | ORAL | Status: DC
  Administered 2014-09-11: 1 via ORAL
  Filled 2014-09-10: qty 1

## 2014-09-10 MED ORDER — PRAVASTATIN SODIUM 80 MG PO TABS
80.0000 mg | ORAL_TABLET | Freq: Every day | ORAL | Status: DC
  Administered 2014-09-10 – 2014-09-11 (×2): 80 mg via ORAL
  Filled 2014-09-10 (×3): qty 1

## 2014-09-10 MED ORDER — TRAZODONE HCL 100 MG PO TABS
100.0000 mg | ORAL_TABLET | Freq: Every day | ORAL | Status: DC
  Filled 2014-09-10 (×3): qty 1

## 2014-09-10 MED ORDER — ONDANSETRON HCL 4 MG/2ML IJ SOLN: 4.0000 mg | Freq: Four times a day (QID) | INTRAMUSCULAR | Status: DC | PRN

## 2014-09-10 NOTE — Op Note (Signed)
Procedure report  Procedure performed:  1. Implantation of new single chamber cardioverter defibrillator  2. Fluoroscopy  3. Moderate sedation  4. Initial lead and generator testing    Reason for procedure: Primary prevention of sudden cardiac death Ischemic cardiomyopathy, left ventricular ejection fraction less than 30%, Heart failure NYHA class II > 6 months, on comprehensive medical therapy for over 90 days (MADIT-II)  Procedure performed by: Sanda Klein, MD  Complications: None  Estimated blood loss: <10 mL  Medications administered during procedure: Ancef 2 g intravenously Lidocaine 1% 30 mL locally,  Fentanyl 75 mcg intravenously Versed 7 mg intravenously  Device details:  Generator St. Jude Fortify Assura model [id number] serial number [id number] Right ventricular lead St. Jude Durata [id number] serial number [id number]  Procedure details:  After the risks and benefits of the procedure were discussed the patient provided informed consent and was brought to the cardiac cath lab in the fasting state. The patient was prepped and draped in usual sterile fashion. Local anesthesia with 1% lidocaine was administered to to the left infraclavicular area. A 5-6 cm horizontal incision was made parallel with and 2-3 cm caudal to the left clavicle. Using electrocautery and blunt dissection a prepectoral pocket was created down to the level of the pectoralis major muscle fascia. The pocket was carefully inspected for hemostasis. An antibiotic-soaked sponge was placed in the pocket.  Under fluoroscopic guidance and using the modified Seldinger technique a single venipuncture was performed to access the left subclavian vein. No difficulty was encountered accessing the vein.  A J-tip guidewire was subsequently exchanged for a 65F [place] safe sheath.  Under fluoroscopic guidance the ventricular lead was advanced to level of the mid to apical right ventricular septum and thet  active-fixation helix was deployed. Prominent current of injury was seen. Satisfactory pacing and sensing parameters were recorded. There was no evidence of diaphragmatic stimulation at maximum device output. The safe sheath was peeled away and the lead was secured in place with 2-0 silk.  The antibiotic-soaked sponge was removed from the pocket. The pocket was flushed with copious amounts of antibiotic solution. Reinspection showed excellent hemostasis.  The ventricular lead was connected to the generator and appropriate ventricular pacing was seen. Repeat testing of the lead parameters via telemetry showed excellent values.  The entire system was then carefully inserted in the pocket with care been taking that the leads and device assumed a comfortable position without pressure on the incision. Great care was taken that the leads be located deep to the generator. The pocket was then closed in layers using 2 layers of 2-0 Vicryl and cutaneous staples, after which a sterile dressing was applied.  Defibrillation threshold testing was then performed. After adequate sedation was achieved, ventricular fibrillation was induced with a 1J shock on T method. There was appropriate sensing by the device. There were no dropouts during "least sensitive" settings. The arrhythmia was terminated by a single 15J shock. High voltage impedance during the shock was 65 ohm. Retesting of the leads confirmed normal function.  At the end of the procedure the following lead parameters were encountered:  Right ventricular lead sensed R waves 8.3 mV, impedance 552 ohms, threshold 0.8 V at 0.5 ms pulse width.  High voltage impedance 65, charge time 2.4s  Sanda Klein, MD, [facility] [phone] office [phone] pager

## 2014-09-10 NOTE — Progress Notes (Signed)
Report received from cath lab 1800 and pt arrived to the unit via bed at 1825. Pt oriented to the unit and room; vitals taken; pain assessed; family at bedside. Sling remains on pt left upper extremity. Pt and family educated on not turning to the left side and precautions on the LUE as ordered. Pt voided 332ml on arrival; dsg to the left chest has scant sanguineous drainage which is marked from cath lab. No additional drainage noted; unable to see site d/t dsg in place. Family at bedside with pt. Will continue to monitor pt quietly. Francis Gaines Dereona Kolodny RN.

## 2014-09-10 NOTE — Progress Notes (Signed)
ICD Criteria  Current LVEF:20-25 % ;Obtained > 3 months ago and < or = 6 months ago.   NYHA Functional Classification: Class II  Heart Failure History:  Yes, Duration of heart failure since onset is > 9 months  Non-Ischemic Dilated Cardiomyopathy History:  No.  Atrial Fibrillation/Atrial Flutter:  No.  Ventricular Tachycardia History:  No.  Cardiac Arrest History:  No  History of Syndromes with Risk of Sudden Death:  No.  Previous ICD:  No.  Electrophysiology Study: No.  Prior MI: Yes, Most recent MI timeframe is > 40 days.  PPM: No.  OSA:  No  Patient Life Expectancy of >=1 year: Yes.  Anticoagulation Therapy:  Patient is NOT on anticoagulation therapy.   Beta Blocker Therapy:  Yes.   Ace Inhibitor/ARB Therapy:  Yes.

## 2014-09-10 NOTE — Progress Notes (Signed)
Reported off to incoming RN. Delia Heady RN

## 2014-09-10 NOTE — H&P (View-Only) (Signed)
Patient ID: Richard [name], male   DOB: 1955/08/23, 59 y.o.   MRN: [id number]     Reason for office visit Discuss ICD  He underwent a nuclear perfusion study on 03/01/2014. Ejection fraction was 27% with extensive scar in multiple coronary territories without definitive reversible ischemia. An echo Doppler study, which was done on 03/01/2014 showed an ejection fraction of 20-25% with severe global hypokinesis, but more pronounced inferior hypokinesis.  He underwent cardiac catheterization by me on 04/02/2014 and was found to have ejection fraction of approximately 15%. He had multivessel CAD with 80 and 90% stenoses in the first diagonal branch of the LAD, 80-90% LAD stenoses after the first septal perforating artery, and 95% apical LAD stenosis. There is a 95% distal inferior LV branch stenosis of the distal circumflex and the RCA was totally occluded proximally with left to right collaterals. He had normal right heart pressures. He underwent surgical evaluation and was turned down. The following day he underwent successful intervention to his diagonal and LAD by Dr. Martinique with insertion of a 3.0x38 mm Promus DES stent in the mid LAD and 3 stents placed in the diagonal vessel. He was discharged on 04/04/2014.  He has been on isosorbide 30 mg, lisinopril 10 mg, and metoprolol 50 mg twice a day. He also has been taking dual antiplatelet therapy with aspirin and Plavix. He is on Lipitor 40 mg and Zetia 10 mg for hyperlipidemia.  Repeat echo was performed on 04/29/2014. As anticipated (due to absence of reversibility on perfusion study) there was little change in LV function.The estimated ejection fraction was in the range of 20% to 25%. Diffuse hypokinesis, akinesis ofthe anteroseptal and inferoseptal myocardium.  He has little in the way of CHF symptoms, NYHA class II. He requires a very low dose of loop diuretic.  Major comorbid conditions include HIV on antiretrovirals (reports  undetectable virus for last 5 years), treated hyperlipidemia  Allergies  Allergen Reactions  . Ivp Dye [Iodinated Diagnostic Agents] Hives    Current Outpatient Prescriptions  Medication Sig Dispense Refill  . aspirin 81 MG chewable tablet Chew 81 mg by mouth daily.    [name] atorvastatin (LIPITOR) 40 MG tablet Take 40 mg by mouth daily.    . clopidogrel (PLAVIX) 75 MG tablet Take 1 tablet (75 mg total) by mouth daily with breakfast. 30 tablet 0  . clotrimazole (LOTRIMIN) 1 % cream Apply 1 application topically as needed (For itching of foot).    . ezetimibe (ZETIA) 10 MG tablet Take 5 mg by mouth daily.     . furosemide (LASIX) 20 MG tablet Take 1 tablet (20 mg total) by mouth daily as needed. 30 tablet 3  . ipratropium (ATROVENT) 0.06 % nasal spray Place 2 sprays into both nostrils 4 (four) times daily as needed for rhinitis.    [name] isosorbide mononitrate (IMDUR) 30 MG 24 hr tablet Take 1 tablet (30 mg total) by mouth daily. 30 tablet 6  . lisinopril (PRINIVIL,ZESTRIL) 5 MG tablet Take 1 tablet (5 mg total) by mouth daily. 90 tablet 3  . lopinavir-ritonavir (KALETRA) 200-50 MG per tablet Take 2 tablets by mouth 2 (two) times daily.    [name] loratadine (CLARITIN) 10 MG tablet Take 10 mg by mouth daily as needed for allergies.    . metoprolol (LOPRESSOR) 50 MG tablet Take 1 tablet in the morning and 1/2 tablet at night 180 tablet 3  . pantoprazole (PROTONIX) 40 MG tablet Take 1 tablet (40 mg total) by mouth daily  as needed. 30 tablet 0  . raltegravir (ISENTRESS) 400 MG tablet Take 400 mg by mouth 2 (two) times daily.    . risperiDONE (RISPERDAL) 2 MG tablet Take 2 mg by mouth daily.      No current facility-administered medications for this visit.    Past Medical History  Diagnosis Date  . HIV (human immunodeficiency virus infection)   . Hypertension   . Coronary artery disease     a. cath 04/02/2014 3v dx 80-90% in D1, 80-90% mid LAD, 95% apical LAD, 95% distal inferior LV branch of LCx, total  occlusion of prox RCA  b). Cath 04/03/2014 DES to mid LAD, DES x3 to prox D1, DAPT indefinitely. LCx lesion untreated  . Stroke   . Cardiomyopathy     SEVERE    Past Surgical History  Procedure Laterality Date  . Cardiac catheterization  04/02/2014    DR KELLY  . Left and right heart catheterization with coronary angiogram N/A 04/02/2014    Procedure: LEFT AND RIGHT HEART CATHETERIZATION WITH CORONARY ANGIOGRAM;  Surgeon: Troy Sine, MD;  Location: [facility] CATH LAB;  Service: Cardiovascular;  Laterality: N/A;  . Percutaneous coronary stent intervention (pci-s) N/A 04/03/2014    Procedure: PERCUTANEOUS CORONARY STENT INTERVENTION (PCI-S);  Surgeon: Peter M Martinique, MD;  Location: [facility] CATH LAB;  Service: Cardiovascular;  Laterality: N/A;    Family History  Problem Relation Age of Onset  . Heart disease Mother     History   Social History  . Marital Status: Married    Spouse Name: N/A    Number of Children: N/A  . Years of Education: N/A   Occupational History  . Not on file.   Social History Main Topics  . Smoking status: Never Smoker   . Smokeless tobacco: Never Used  . Alcohol Use: No  . Drug Use: No  . Sexual Activity: Not Currently   Other Topics Concern  . Not on file   Social History Narrative    Review of systems: The patient specifically denies any chest pain at rest or with exertion, dyspnea at rest or with usual exertion, orthopnea, paroxysmal nocturnal dyspnea, syncope, palpitations, focal neurological deficits, intermittent claudication, lower extremity edema, unexplained weight gain, cough, hemoptysis or wheezing.  The patient also denies abdominal pain, nausea, vomiting, dysphagia, diarrhea, constipation, polyuria, polydipsia, dysuria, hematuria, frequency, urgency, abnormal bleeding or bruising, fever, chills, unexpected weight changes, mood swings, change in skin or hair texture, change in voice quality, auditory or visual problems, allergic reactions or rashes,  new musculoskeletal complaints other than usual "aches and pains".   PHYSICAL EXAM BP 104/78 mmHg  Pulse 66  Resp 16  Ht 5' 9.5" (1.765 m)  Wt 188 lb 9.6 oz (85.548 kg)  BMI 27.46 kg/m2  General: Alert, oriented x3, no distress Head: no evidence of trauma, PERRL, EOMI, no exophtalmos or lid lag, no myxedema, no xanthelasma; normal ears, nose and oropharynx Neck: normal jugular venous pulsations and no hepatojugular reflux; brisk carotid pulses without delay and no carotid bruits Chest: clear to auscultation, no signs of consolidation by percussion or palpation, normal fremitus, symmetrical and full respiratory excursions Cardiovascular: normal position and quality of the apical impulse, regular rhythm, normal first and second heart sounds, no murmurs, rubs or gallops Abdomen: no tenderness or distention, no masses by palpation, no abnormal pulsatility or arterial bruits, normal bowel sounds, no hepatosplenomegaly Extremities: no clubbing, cyanosis or edema; 2+ radial, ulnar and brachial pulses bilaterally; 2+ right femoral, posterior tibial and  dorsalis pedis pulses; 2+ left femoral, posterior tibial and dorsalis pedis pulses; no subclavian or femoral bruits Neurological: grossly nonfocal   EKG: NSR, one PVC, Inverted T waves V3-V6, I, aVL  Lipid Panel     Component Value Date/Time   CHOL 189 04/23/2014 0807   TRIG 155* 04/23/2014 0807   HDL 29* 04/23/2014 0807   CHOLHDL 6.5 04/23/2014 0807   VLDL 31 04/23/2014 0807   LDLCALC 129* 04/23/2014 0807    BMET    Component Value Date/Time   NA 135 04/23/2014 0807   K 4.3 04/23/2014 0807   CL 103 04/23/2014 0807   CO2 25 04/23/2014 0807   GLUCOSE 89 04/23/2014 0807   BUN 17 04/23/2014 0807   CREATININE 1.22 04/23/2014 0807   CREATININE 1.13 04/04/2014 0258   CALCIUM 9.1 04/23/2014 0807   GFRNONAA 71* 04/04/2014 0258   GFRAA 82* 04/04/2014 0258     ASSESSMENT AND PLAN  Mr. Whirley meets primary prevention ICD  implantation criteria for ischemic cardiomyopathy (Prior myocardial infarction, left ventricular ejection fraction under 35%, heart failure NYHA class II, on comprehensive medical therapy, >90 days since last revascularization). His HIV infection (now about 20 years since infection) is well controlled.  Discussed the risk of SCD, purpose of AICD, pros/cons, possible procedure related and long term complications in detail, including infection, pneumothorax, perforation, need for further procedures, death, unnecessary shocks, hardware failure, potential future recalls, etc.  This procedure has been fully reviewed with the patient and informed consent has been obtained.  Plan to continue aspirin and clopidogrel throughout the procedure (DES 5 months ago) which may increase bleeding risk.  Orders Placed This Encounter  Procedures  . APTT  . CBC  . Comprehensive metabolic panel  . Protime-INR  . EKG 12-Lead  . IMPLANTABLE CARDIOVERTER DEFIBRILLATOR IMPLANT   No orders of the defined types were placed in this encounter.    Holli Humbles, MD, [place] [phone] office [phone] pager

## 2014-09-10 NOTE — Interval H&P Note (Signed)
History and Physical Interval Note:  09/10/2014 12:50 PM  Richard [name]  has presented today for surgery, with the diagnosis of cm  The various methods of treatment have been discussed with the patient and family. After consideration of risks, benefits and other options for treatment, the patient has consented to  Procedure(s): IMPLANTABLE CARDIOVERTER DEFIBRILLATOR IMPLANT (N/A) as a surgical intervention .  The patient's history has been reviewed, patient examined, no change in status, stable for surgery.  I have reviewed the patient's chart and labs.  Questions were answered to the patient's satisfaction.     Ad Guttman

## 2014-09-11 ENCOUNTER — Ambulatory Visit (HOSPITAL_COMMUNITY): Payer: Medicare Other

## 2014-09-11 ENCOUNTER — Telehealth: Payer: Self-pay | Admitting: Cardiovascular Disease

## 2014-09-11 ENCOUNTER — Encounter (HOSPITAL_COMMUNITY): Payer: Self-pay | Admitting: Physician Assistant

## 2014-09-11 DIAGNOSIS — I2582 Chronic total occlusion of coronary artery: Secondary | ICD-10-CM | POA: Diagnosis not present

## 2014-09-11 DIAGNOSIS — Z7902 Long term (current) use of antithrombotics/antiplatelets: Secondary | ICD-10-CM | POA: Diagnosis not present

## 2014-09-11 DIAGNOSIS — I255 Ischemic cardiomyopathy: Secondary | ICD-10-CM | POA: Diagnosis not present

## 2014-09-11 DIAGNOSIS — I2510 Atherosclerotic heart disease of native coronary artery without angina pectoris: Secondary | ICD-10-CM | POA: Diagnosis not present

## 2014-09-11 DIAGNOSIS — Z9189 Other specified personal risk factors, not elsewhere classified: Secondary | ICD-10-CM

## 2014-09-11 DIAGNOSIS — E785 Hyperlipidemia, unspecified: Secondary | ICD-10-CM | POA: Diagnosis not present

## 2014-09-11 DIAGNOSIS — Z95810 Presence of automatic (implantable) cardiac defibrillator: Secondary | ICD-10-CM

## 2014-09-11 DIAGNOSIS — Z91041 Radiographic dye allergy status: Secondary | ICD-10-CM | POA: Diagnosis not present

## 2014-09-11 NOTE — Telephone Encounter (Signed)
Richard [name] is calling because he wants to know if he is out of town will the ICD still be monitoring him . He states this iIwhat he did not understand .[name] Please Call  Thanks

## 2014-09-11 NOTE — Discharge Instructions (Signed)

## 2014-09-11 NOTE — Telephone Encounter (Signed)
Routed to device pool

## 2014-09-11 NOTE — Progress Notes (Signed)
Pt discharge education and instructions completed with pt and spouse at bedside. All voices understanding and denies any questions. Pt left chest ICD incision site had dsg intact to the site. drainage to site remains marked from cath lab with no additional drainage noted. Pt IV and telemetry removed; pt discharge home with spouse to transport him home. Pt transported off unit via wheelchair with spouse and belongings to the side. Francis Gaines Alazia Crocket RN.

## 2014-09-11 NOTE — Discharge Summary (Signed)
CARDIOLOGY DISCHARGE SUMMARY   Patient ID: Richard [name] MRN: [id number] DOB/AGE: Oct 21, 1955 59 y.o.  Admit date: 09/10/2014 Discharge date: 09/11/2014  PCP: Default, Provider, MD Primary Cardiologist: Dr. Claiborne Billings ICD M.D.: Dr. Sallyanne Kuster  Primary Discharge Diagnosis:  Ischemic cardiomyopathy Secondary Discharge Diagnosis:    At risk for sudden cardiac death   ICD (implantable cardioverter-defibrillator) in place  Procedures:  1. Implantation of new single chamber cardioverter defibrillator 2. Fluoroscopy 3. Moderate sedation 4. Initial lead and generator testing 5. Two-view chest x-ray post procedure  Hospital Course: Richard [name] is a 59 y.o. male with a history of CAD and ischemic cardiomyopathy. Heart catheterization in August 2015 showed multivessel CAD but he was turned down for CABG. He had stenting to the LAD and diagonal. He has been on maximal medical therapy with beta blocker, ACE inhibitor, total antiplatelet therapy, statin and nitrates. However his EF has not improved. He was referred to Dr. Sallyanne Kuster for ICD insertion. He came to the hospital on the day of admission for the procedure.  He had a St. Jude single lead ICD inserted without immediate complication. He tolerated the procedure well.  On 02/03, he was seen by Dr. Claiborne Billings and all data were reviewed. A follow-up chest x-ray showed no significant abnormality. He was ambulating well and had no hematoma at the insertion site. No further inpatient workup was indicated and he is considered stable for discharge, to follow up as an outpatient.  Labs:   Lab Results  Component Value Date   WBC 3.1* 09/04/2014   HGB 13.7 09/04/2014   HCT 41.6 09/04/2014   MCV 80.9 09/04/2014   PLT 141* 09/04/2014     Radiology: Dg Chest 2 View 09/11/2014   CLINICAL DATA:  Implantable cardioverter-defibrillator placed yesterday  EXAM: CHEST  2 VIEW  COMPARISON:  PA and lateral chest x-ray of February 04, 2014  FINDINGS: The  lungs are well-expanded and clear. There is no pneumothorax or pleural effusion. The heart and pulmonary vascularity are normal. The implantable pacemaker defibrillator is in appropriate position radiographically. Skin staples are present over the generator. The bony thorax is unremarkable.  IMPRESSION: There is no postprocedure complication following placement of the permanent pacemaker defibrillator.   Electronically Signed   By: David  Martinique   On: 09/11/2014 07:53    ICD Insertion: 09/10/2014 Device details: Pyatt [id number] serial number [id number] Right ventricular lead St. Jude Durata [id number] serial number [id number] Procedure details: After the risks and benefits of the procedure were discussed the patient provided informed consent and was brought to the cardiac cath lab in the fasting state. The patient was prepped and draped in usual sterile fashion. Local anesthesia with 1% lidocaine was administered to to the left infraclavicular area. A 5-6 cm horizontal incision was made parallel with and 2-3 cm caudal to the left clavicle. Using electrocautery and blunt dissection a prepectoral pocket was created down to the level of the pectoralis major muscle fascia. The pocket was carefully inspected for hemostasis. An antibiotic-soaked sponge was placed in the pocket. Under fluoroscopic guidance and using the modified Seldinger technique a single venipuncture was performed to access the left subclavian vein. No difficulty was encountered accessing the vein. A J-tip guidewire was subsequently exchanged for a 38F [place] safe sheath. Under fluoroscopic guidance the ventricular lead was advanced to level of the mid to apical right ventricular septum and thet active-fixation helix was deployed. Prominent current of injury was seen. Satisfactory pacing  and sensing parameters were recorded. There was no evidence of diaphragmatic stimulation at maximum device output. The safe sheath  was peeled away and the lead was secured in place with 2-0 silk. The antibiotic-soaked sponge was removed from the pocket. The pocket was flushed with copious amounts of antibiotic solution. Reinspection showed excellent hemostasis. The ventricular lead was connected to the generator and appropriate ventricular pacing was seen. Repeat testing of the lead parameters via telemetry showed excellent values. The entire system was then carefully inserted in the pocket with care been taking that the leads and device assumed a comfortable position without pressure on the incision. Great care was taken that the leads be located deep to the generator. The pocket was then closed in layers using 2 layers of 2-0 Vicryl and cutaneous staples, after which a sterile dressing was applied. Defibrillation threshold testing was then performed. After adequate sedation was achieved, ventricular fibrillation was induced with a 1J shock on T method. There was appropriate sensing by the device. There were no dropouts during "least sensitive" settings. The arrhythmia was terminated by a single 15J shock. High voltage impedance during the shock was 65 ohm. Retesting of the leads confirmed normal function. At the end of the procedure the following lead parameters were encountered: Right ventricular lead sensed R waves 8.3 mV, impedance 552 ohms, threshold 0.8 V at 0.5 ms pulse width. High voltage impedance 65, charge time 2.4s  EKG: 09/11/2014 Sinus rhythm, no new ischemic changes  FOLLOW UP PLANS AND APPOINTMENTS Allergies  Allergen Reactions  . Ivp Dye [Iodinated Diagnostic Agents] Hives     Medication List    STOP taking these medications        pravastatin 80 MG tablet  Commonly known as:  PRAVACHOL      TAKE these medications        aspirin 81 MG chewable tablet  Chew 81 mg by mouth daily.     atorvastatin 40 MG tablet  Commonly known as:  LIPITOR  Take 40 mg by mouth daily.     clopidogrel 75 MG tablet    Commonly known as:  PLAVIX  Take 1 tablet (75 mg total) by mouth daily with breakfast.     clotrimazole 1 % cream  Commonly known as:  LOTRIMIN  Apply 1 application topically as needed (For itching of foot).     emtricitabine-tenofovir 200-300 MG per tablet  Commonly known as:  TRUVADA  Take 1 tablet by mouth daily.     ezetimibe 10 MG tablet  Commonly known as:  ZETIA  Take 5 mg by mouth daily.     furosemide 20 MG tablet  Commonly known as:  LASIX  Take 1 tablet (20 mg total) by mouth daily as needed.     ipratropium 0.06 % nasal spray  Commonly known as:  ATROVENT  Place 2 sprays into both nostrils 4 (four) times daily as needed for rhinitis.     isosorbide mononitrate 30 MG 24 hr tablet  Commonly known as:  IMDUR  Take 1 tablet (30 mg total) by mouth daily.     lisinopril 5 MG tablet  Commonly known as:  PRINIVIL,ZESTRIL  Take 1 tablet (5 mg total) by mouth daily.     lopinavir-ritonavir 200-50 MG per tablet  Commonly known as:  KALETRA  Take 2 tablets by mouth 2 (two) times daily.     loratadine 10 MG tablet  Commonly known as:  CLARITIN  Take 10 mg by mouth daily as needed  for allergies.     metoprolol 50 MG tablet  Commonly known as:  LOPRESSOR  Take 1 tablet in the morning and 1/2 tablet at night     pantoprazole 40 MG tablet  Commonly known as:  PROTONIX  Take 1 tablet (40 mg total) by mouth daily as needed.     raltegravir 400 MG tablet  Commonly known as:  ISENTRESS  Take 400 mg by mouth 2 (two) times daily.     risperiDONE 2 MG tablet  Commonly known as:  RISPERDAL  Take 2 mg by mouth daily.     traZODone 100 MG tablet  Commonly known as:  DESYREL  Take 100 mg by mouth at bedtime.        Follow-up Information    Follow up with Troy Sine, MD On 09/25/2014.   Specialty:  Cardiology   Why:  Wound check, St Jude rep will check device, and see Dr. Dyann Kief information:   [place] [address] [place]  [id number] [phone]       [place]  Time spent with patient to include physician time: 41 min Signed: Rosaria Ferries, PA-C 09/11/2014, 12:12 PM Co-Sign MD

## 2014-09-11 NOTE — Progress Notes (Signed)
Subjective:  DAY 1 S/P ICD IMPLANT; NO CP OR SOB.  Objective:   Vital Signs : Filed Vitals:   09/10/14 2350 09/11/14 0105 09/11/14 0313 09/11/14 0536  BP: 110/75 124/73 101/67 117/73  Pulse: 64 63 60 60  Temp:   97.5 F (36.4 C)   TempSrc:  Oral Oral   Resp:   18 16  Height:      Weight:      SpO2: 98% 99% 96% 99%    Intake/Output from previous day: 02/02 0701 - 02/03 0700 In: -  Out: 625 [Urine:625] Net: -625  I/O since admission: -505  Medications: . aspirin  81 mg Oral Daily  . clopidogrel  75 mg Oral Q breakfast  . emtricitabine-tenofovir  1 tablet Oral Daily  . ezetimibe  5 mg Oral Daily  . isosorbide mononitrate  30 mg Oral Daily  . lisinopril  10 mg Oral Daily  . lopinavir-ritonavir  2 tablet Oral BID  . mupirocin ointment   Nasal BID  . pantoprazole  40 mg Oral Daily  . pravastatin  80 mg Oral Daily  . raltegravir  400 mg Oral BID  . risperiDONE  2 mg Oral Daily  . traZODone  100 mg Oral QHS       Physical Exam:   General appearance: alert, cooperative and no distress Neck: no adenopathy, no JVD, supple, symmetrical, trachea midline and thyroid not enlarged, symmetric, no tenderness/mass/nodules Lungs: clear to auscultation bilaterally Heart: regular rate and rhythm and 1/6 systolic murmur; no diastolic murmur Abdomen: soft, non-tender; bowel sounds normal; no masses,  no organomegaly Extremities: no edema, redness or tenderness in the calves or thighs Skin: Skin color, texture, turgor normal. No rashes or lesions Neurologic: Grossly normal   Rate: 70  Rhythm: normal sinus rhythm  ECG (independently read by me): NSR at 61;  Lab Results:  BMP Latest Ref Rng 09/04/2014 04/23/2014 04/04/2014  Glucose 70 - 99 mg/dL 84 89 132(H)  BUN 6 - 23 mg/dL 14 17 17   Creatinine 0.50 - 1.35 mg/dL 1.17 1.22 1.13  Sodium 135 - 145 mEq/L 137 135 138  Potassium 3.5 - 5.3 mEq/L 4.0 4.3 4.1  Chloride 96 - 112 mEq/L 105 103 105  CO2 19 - 32 mEq/L 24 25 21     Calcium 8.4 - 10.5 mg/dL 8.9 9.1 8.6     CBC Latest Ref Rng 09/04/2014 04/23/2014 04/04/2014  WBC 4.0 - 10.5 K/uL 3.1(L) 3.5(L) 8.4  Hemoglobin 13.0 - 17.0 g/dL 13.7 13.6 12.5(L)  Hematocrit 39.0 - 52.0 % 41.6 39.4 35.9(L)  Platelets 150 - 400 K/uL 141(L) 148(L) 149(L)     No results for input(s): TROPONINI in the last 72 hours.  Invalid input(s): CK, MB  Hepatic Function Panel No results for input(s): PROT, ALBUMIN, AST, ALT, ALKPHOS, BILITOT, BILIDIR, IBILI in the last 72 hours. No results for input(s): INR in the last 72 hours. BNP (last 3 results) No results for input(s): BNP in the last 8760 hours.  ProBNP (last 3 results) No results for input(s): PROBNP in the last 8760 hours.   Lipid Panel     Component Value Date/Time   CHOL 189 04/23/2014 0807   TRIG 155* 04/23/2014 0807   HDL 29* 04/23/2014 0807   CHOLHDL 6.5 04/23/2014 0807   VLDL 31 04/23/2014 0807   LDLCALC 129* 04/23/2014 0807      Imaging:  Dg Chest 2 View  09/11/2014   CLINICAL DATA:  Implantable cardioverter-defibrillator placed yesterday  EXAM: CHEST  2 VIEW  COMPARISON:  PA and lateral chest x-ray of February 04, 2014  FINDINGS: The lungs are well-expanded and clear. There is no pneumothorax or pleural effusion. The heart and pulmonary vascularity are normal. The implantable pacemaker defibrillator is in appropriate position radiographically. Skin staples are present over the generator. The bony thorax is unremarkable.  IMPRESSION: There is no postprocedure complication following placement of the permanent pacemaker defibrillator.   Electronically Signed   By: David  Martinique   On: 09/11/2014 07:53      Assessment/Plan:   Active Problems:   Cardiomyopathy, ischemic   At risk for sudden cardiac death   ICD (implantable cardioverter-defibrillator) in place  Doing well s/p ICD implantation for primary prevention of sudden cardiac death Ischemic cardiomyopathy, left ventricular ejection fraction less than  30%, Heart failure NYHA class II > 6 months, on comprehensive medical therapy for over 90 days (MADIT-II). No recurrent angina or CHF symptoms. CXR stable post procedure.  Change pravastatin to atorvastatin 40 mg. Resume metoprolol. F/u wound clinic in 7 - 10 days, then with Dr. Loletha Grayer and ultimately with me      Troy Sine, MD, [facility] 09/11/2014, 9:45 AM

## 2014-09-11 NOTE — Telephone Encounter (Signed)
Informed pt that ICD will record and save information and when he gets back to where his monitor is it will transmit information. Pt verbalized understanding.

## 2014-09-14 ENCOUNTER — Telehealth: Payer: Self-pay | Admitting: Nurse Practitioner

## 2014-09-14 NOTE — Telephone Encounter (Signed)
   Pt called b/c he has developed itching and rash over left shoulder s/p recent ICD.  He would like to take benadryl.  I advised that that would be ok.  He will call back if rash doesn't improve.  Caller verbalized understanding and was grateful for the call back.  Murray Hodgkins, NP 09/14/2014, 2:50 PM

## 2014-09-16 ENCOUNTER — Telehealth: Payer: Self-pay | Admitting: Cardiovascular Disease

## 2014-09-16 NOTE — Telephone Encounter (Signed)
Please make appointment for patient to be seen per Ignacia Bayley, NP.

## 2014-09-16 NOTE — Telephone Encounter (Signed)
LMOVM for pt to return call 

## 2014-09-16 NOTE — Telephone Encounter (Signed)
If he still has a rash, he will need to have it looked at.  Ideally, this should be done through device clinic.

## 2014-09-16 NOTE — Telephone Encounter (Signed)
Pt called back in concerning rash and itching.

## 2014-09-16 NOTE — Telephone Encounter (Signed)
Per Answering Service:-Pt had ICD put in last week,he has a rash on his left arm.

## 2014-09-17 NOTE — Telephone Encounter (Signed)
Pt state that he took a benadryl and that the rash is drying up. After consulting with Device tech I informed pt that if he had any other issues to give [place] a call and we would make him an appt to be seen by device clinic. Pt verbalized understanding.

## 2014-09-17 NOTE — Telephone Encounter (Signed)
LMOVM for pt to return call. 2nd attempt 

## 2014-09-25 ENCOUNTER — Ambulatory Visit (INDEPENDENT_AMBULATORY_CARE_PROVIDER_SITE_OTHER): Payer: Medicare Other | Admitting: *Deleted

## 2014-09-25 ENCOUNTER — Ambulatory Visit (INDEPENDENT_AMBULATORY_CARE_PROVIDER_SITE_OTHER): Payer: Medicare Other | Admitting: Cardiovascular Disease

## 2014-09-25 VITALS — BP 90/78 | HR 63 | Ht 69.5 in | Wt 187.0 lb

## 2014-09-25 DIAGNOSIS — E785 Hyperlipidemia, unspecified: Secondary | ICD-10-CM | POA: Diagnosis not present

## 2014-09-25 DIAGNOSIS — I255 Ischemic cardiomyopathy: Secondary | ICD-10-CM | POA: Diagnosis not present

## 2014-09-25 DIAGNOSIS — Z79899 Other long term (current) drug therapy: Secondary | ICD-10-CM

## 2014-09-25 DIAGNOSIS — I10 Essential (primary) hypertension: Secondary | ICD-10-CM | POA: Diagnosis not present

## 2014-09-25 DIAGNOSIS — Z8673 Personal history of transient ischemic attack (TIA), and cerebral infarction without residual deficits: Secondary | ICD-10-CM

## 2014-09-25 DIAGNOSIS — Z9189 Other specified personal risk factors, not elsewhere classified: Secondary | ICD-10-CM

## 2014-09-25 DIAGNOSIS — B20 Human immunodeficiency virus [HIV] disease: Secondary | ICD-10-CM | POA: Diagnosis not present

## 2014-09-25 DIAGNOSIS — I2510 Atherosclerotic heart disease of native coronary artery without angina pectoris: Secondary | ICD-10-CM

## 2014-09-25 LAB — MDC_IDC_ENUM_SESS_TYPE_INCLINIC
Date Time Interrogation Session: 20160217104520
HIGH POWER IMPEDANCE MEASURED VALUE: 56.25 Ohm
Implantable Pulse Generator Serial Number: 7229147
Lead Channel Impedance Value: 387.5 Ohm
Lead Channel Pacing Threshold Amplitude: 1 V
Lead Channel Pacing Threshold Amplitude: 1 V
Lead Channel Pacing Threshold Pulse Width: 0.5 ms
Lead Channel Pacing Threshold Pulse Width: 0.5 ms
Lead Channel Sensing Intrinsic Amplitude: 8.1 mV
Lead Channel Setting Pacing Pulse Width: 0.5 ms
Lead Channel Setting Sensing Sensitivity: 0.5 mV
MDC IDC MSMT BATTERY REMAINING LONGEVITY: 109.2 mo
MDC IDC SET LEADCHNL RV PACING AMPLITUDE: 3.5 V
MDC IDC SET ZONE DETECTION INTERVAL: 300 ms
MDC IDC STAT BRADY RV PERCENT PACED: 0.1 %

## 2014-09-25 LAB — LIPID PANEL
Cholesterol: 151 mg/dL (ref 0–200)
HDL: 31 mg/dL — ABNORMAL LOW (ref >39)
LDL CALC: 96 mg/dL (ref 0–99)
Total CHOL/HDL Ratio: 4.9 Ratio
Triglycerides: 122 mg/dL (ref <150)
VLDL: 24 mg/dL (ref 0–40)

## 2014-09-25 LAB — COMPREHENSIVE METABOLIC PANEL
ALK PHOS: 70 U/L (ref 39–117)
ALT: 23 U/L (ref 0–53)
AST: 19 U/L (ref 0–37)
Albumin: 4.3 g/dL (ref 3.5–5.2)
BUN: 16 mg/dL (ref 6–23)
CALCIUM: 9.3 mg/dL (ref 8.4–10.5)
CO2: 22 mEq/L (ref 19–32)
Chloride: 105 mEq/L (ref 96–112)
Creat: 1.14 mg/dL (ref 0.50–1.35)
GLUCOSE: 80 mg/dL (ref 70–99)
POTASSIUM: 4.2 meq/L (ref 3.5–5.3)
SODIUM: 139 meq/L (ref 135–145)
TOTAL PROTEIN: 7.5 g/dL (ref 6.0–8.3)
Total Bilirubin: 0.7 mg/dL (ref 0.2–1.2)

## 2014-09-25 LAB — TSH: TSH: 0.917 u[IU]/mL (ref 0.350–4.500)

## 2014-09-25 NOTE — Progress Notes (Signed)
Wound check appointment. Staples removed. Wound without redness or edema. Incision edges approximated, wound well healed. Normal device function. Threshold, sensing, and impedances consistent with implant measurements. Device programmed at 3.5V for extra safety margin until 3 month visit. Histogram distribution appropriate for patient and level of activity. No ventricular arrhythmias noted. Patient educated about wound care, arm mobility, lifting restrictions, shock plan. ROV in 3 months with [facility]. Vibratory alerts demonstated.

## 2014-09-25 NOTE — Patient Instructions (Signed)
Your physician has recommended you make the following change in your medication: cut the lisinopril into half and take 1/2 tablet twice daily.  Your physician recommends that you return for lab work fasting.  Your physician wants you to follow-up in: June/ July with Dr. Claiborne Billings. You will receive a reminder letter in the mail two months in advance. If you don't receive a letter, please call our office to schedule the follow-up appointment.

## 2014-09-27 ENCOUNTER — Encounter: Payer: Self-pay | Admitting: Cardiovascular Disease

## 2014-09-27 NOTE — Progress Notes (Signed)
Patient ID: Richard [name], male   DOB: [birth year], 59 y.o.   MRN: [id number]    HPI:  Richard [name] is a 59 y.o. male who presents for follow-up cardiology evaluation.   Mr. Berkeley suffered a CVA in 2003.  He has been followed at the [facility] hospital.   In 2003 he underwent cardiac catheterization which  which did not reveal significant coronary artery disease. Prior to catheterization  ejection fraction was  33% with multiple areas of scar noted on a nuclear perfusion study.   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  He states that for the past year 5 years.  He has had undetected virus.  He has a history of hypertension, as well as hyperlipidemia.  There is also a history of depression, for which he takes Risperdal.  I initially saw him with complaints of some episodes of left-sided chest pain with walking, associated with left arm numbness and increasing shortness of breath with activity and is referred for nuclear study. He underwent a recent nuclear perfusion study on 03/01/2014.  Ejection fraction was about 27%.  This showed extensive scar multiple coronary territories without definitive reversible ischemia.  An echo Doppler study, which was done on 03/01/2014 showed an ejection fraction of 20-25% with severe global hypokinesis, but more pronounced inferior hypokinesis.  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  He had multivessel CAD with 80 and 90% stenoses in the first diagonal branch of the LAD, 80-90% LAD stenoses after the first septal perforating artery, and 95% apical LAD stenosis.  There is a 95% distal inferior LV branch stenosis of the distal circumflex and the RCA was totally occluded proximally with left to right collaterals.  He had normal right heart pressures and there was no evidence for Polaner.   Hypertension.  He underwent surgical evaluation and was turned down.  The following day he underwent successful intervention to his diagonal and LAD by Dr. Swaziland with insertion of a 3.0x38 mm Promus DES stent in the mid LAD and 3 stents placed in the diagonal vessel.  He was discharged on 04/04/2014.  Socially, he has done well without recurrent chest pain symptomatology.  Since I saw him in the office 2 months ago, he denies any significant episodes of chest pain.  He has been participating in rehabilitation.  His blood pressure has been low.  He is unaware of any tachycardia palpitations.  He has been on isosorbide 30 mg, lisinopril 10 mg, and metoprolol 50 mg twice a day.  He also has been taking dual antiplatelet therapy with aspirin and Plavix.  He is on Lipitor 40 mg and Zetia 10 mg for hyperlipidemia.  His eye last saw him, I referred him to Dr. Royann Shivers for implantation of a cardio defibrillator.  This was done on 09/10/2014 and a single-chamber cardio vertigo defibrillator was inserted.  He was discharged following day.  From a cardiac standpoint, he denies any recent increasing shortness of breath.  He denies any recurrent anginal symptoms since his coronary intervention.  He has concomitant CAD, which is being treated with medical therapy.  He denies bleeding.  He presents for follow-up cardiology evaluation.    Past Medical History  Diagnosis Date  . HIV (human immunodeficiency virus infection)   . Hypertension   . Coronary artery disease     a. cath 04/02/2014  3v dx 80-90% in D1, 80-90% mid LAD, 95% apical LAD, 95% distal inferior LV branch of LCx, total occlusion of prox RCA  b). Cath 04/03/2014 DES to mid LAD, DES x3 to prox D1, DAPT indefinitely. LCx lesion untreated  . Stroke   . Ischemic cardiomyopathy 2015    EF 20-25 percent by echo in 04/2014, s/p [address] [place] model [id number] serial number [id number]    Past Surgical History  Procedure Laterality Date  . Cardiac  catheterization  04/02/2014    DR Briyan Kleven  . Left and right heart catheterization with coronary angiogram N/A 04/02/2014    Procedure: LEFT AND RIGHT HEART CATHETERIZATION WITH CORONARY ANGIOGRAM;  Surgeon: Lennette Bihari, MD;  Location: [facility] CATH LAB;  Service: Cardiovascular;  Laterality: N/A;  . Percutaneous coronary stent intervention (pci-s) N/A 04/03/2014    Procedure: PERCUTANEOUS CORONARY STENT INTERVENTION (PCI-S);  Surgeon: Peter M Swaziland, MD;  Location: [facility] CATH LAB;  Service: Cardiovascular;  Laterality: N/A;  . Implantable cardioverter defibrillator implant N/A 09/10/2014    Procedure: IMPLANTABLE CARDIOVERTER DEFIBRILLATOR IMPLANT;  Surgeon: Thurmon Fair, MD;  [facility] [place] model [id number] serial number [id number]    Allergies  Allergen Reactions  . Ivp Dye [Iodinated Diagnostic Agents] Hives    Current Outpatient Prescriptions  Medication Sig Dispense Refill  . aspirin 81 MG chewable tablet Chew 81 mg by mouth daily.    [name] atorvastatin (LIPITOR) 40 MG tablet Take 40 mg by mouth daily.    . clopidogrel (PLAVIX) 75 MG tablet Take 1 tablet (75 mg total) by mouth daily with breakfast. 30 tablet 0  . clotrimazole (LOTRIMIN) 1 % cream Apply 1 application topically as needed (For itching of foot).    . diphenhydrAMINE (BENADRYL) 25 MG tablet Take 25 mg by mouth 2 (two) times daily.    [name] emtricitabine-tenofovir (TRUVADA) 200-300 MG per tablet Take 1 tablet by mouth daily.    [name] ezetimibe (ZETIA) 10 MG tablet Take 5 mg by mouth daily.     . furosemide (LASIX) 20 MG tablet Take 1 tablet (20 mg total) by mouth daily as needed. 30 tablet 3  . ipratropium (ATROVENT) 0.06 % nasal spray Place 2 sprays into both nostrils 4 (four) times daily as needed for rhinitis.    [name] isosorbide mononitrate (IMDUR) 30 MG 24 hr tablet Take 1 tablet (30 mg total) by mouth daily. 30 tablet 6  . lisinopril (PRINIVIL,ZESTRIL) 5 MG tablet Take 1 tablet (5 mg total) by mouth daily. (Patient taking differently:  Take 10 mg by mouth daily. ) 90 tablet 3  . lopinavir-ritonavir (KALETRA) 200-50 MG per tablet Take 2 tablets by mouth 2 (two) times daily.    [name] loratadine (CLARITIN) 10 MG tablet Take 10 mg by mouth daily as needed for allergies.    . metoprolol (LOPRESSOR) 50 MG tablet Take 1 tablet in the morning and 1/2 tablet at night 180 tablet 3  . pantoprazole (PROTONIX) 40 MG tablet Take 1 tablet (40 mg total) by mouth daily as needed. 30 tablet 0  . raltegravir (ISENTRESS) 400 MG tablet Take 400 mg by mouth 2 (two) times daily.    . risperiDONE (RISPERDAL) 2 MG tablet Take 2 mg by mouth daily.     . traZODone (DESYREL) 100 MG tablet Take 100 mg by mouth at bedtime as needed.      No current facility-administered medications for this visit.    Socially, he is married for 25 years.  Has one child.  He  is disabled and previously had worked as a Naval architect.  He completed 12th grade education.  There is no tobacco use.  He does drink alcohol.  Family History  Problem Relation Age of Onset  . Heart disease Mother     ROS General: Negative; No fevers, chills, or night sweats HEENT: Negative; No changes in vision or hearing, sinus congestion, difficulty swallowing Pulmonary: Negative; No cough, wheezing, shortness of breath, hemoptysis Cardiovascular:  See HPI; No chest pain, presyncope, syncope, palpitations, edema GI: Negative; No nausea, vomiting, diarrhea, or abdominal pain GU: Negative; No dysuria, hematuria, or difficulty voiding Musculoskeletal: Negative; no myalgias, joint pain, or weakness Hematologic/Oncologic: Positive for HIV well-controlled on chronic therapy for years; no easy bruising, bleeding Endocrine: Negative; no heat/cold intolerance; no diabetes Neuro: Negative; no changes in balance, headaches Skin: Negative; No rashes or skin lesions Psychiatric: History of depression. No behavioral problems,  Sleep: Negative; No daytime sleepiness, hypersomnolence, bruxism, restless legs,  hypnogagnic hallucinations Other comprehensive 14 point system review is negative   Physical Exam BP 90/78 mmHg  Pulse 63  Ht 5' 9.5" (1.765 m)  Wt 187 lb (84.823 kg)  BMI 27.23 kg/m2  Repeat blood pressure by me was 98/68 supine and 82/60 standing.  Pulse was regular in the 60s without significant orthostatic pulse rise General: Alert, oriented, no distress.  Skin: normal turgor, no rashes, warm and dry HEENT: Normocephalic, atraumatic. Pupils equal round and reactive to light; sclera anicteric; extraocular muscles intact; Fundi poorly visualized, but without hemorrhages or exudates. Nose without nasal septal hypertrophy Mouth/Parynx benign; Mallinpatti scale 2 Neck: No JVD, no carotid bruits; normal carotid upstroke Lungs: clear to ausculatation and percussion; no wheezing or rales Chest wall: without tenderness to palpitation; no ecchymosis or induration at the ICD implantation site. Heart: PMI not displaced, RRR, s1 s2 normal, 1/6 systolic murmur, no diastolic murmur, no rubs, gallops, thrills, or heaves Abdomen: soft, nontender; no hepatosplenomehaly, BS+; abdominal aorta nontender and not dilated by palpation. Rectal exam performed by me: Stool guaiac-negative.  Prostate without nodules. Back: no CVA tenderness Pulses 2+ Musculoskeletal: full range of motion, normal strength, no joint deformities Extremities: no clubbing cyanosis or edema, Homan's sign negative  Neurologic: grossly nonfocal; Cranial nerves grossly wnl Psychologic: Normal mood and affect  ECG (independently read by me): Normal sinus rhythm at 63 bpm.  Possible old inferior infarct.  Lateral T-wave changes.  Right superior axis  November 2015 ECG (independently read by me):  Normal sinus rhythm.  Probable old IMI.  T-wave changes V3 through V6.  ECG (independently read by me): normal sinus rhythm at 64 beats per minute.  T-wave inversion V3 through V6,  lead 1, 2 and aVF.  Prior 02/20/2014 ECG (independently  read by me): Sinus rhythm with frequent PVCs with transient trigeminy, and evidence for VA conduction with his ectopy.  LABS:  BMET    Component Value Date/Time   NA 139 09/25/2014 1141   K 4.2 09/25/2014 1141   CL 105 09/25/2014 1141   CO2 22 09/25/2014 1141   GLUCOSE 80 09/25/2014 1141   BUN 16 09/25/2014 1141   CREATININE 1.14 09/25/2014 1141   CREATININE 1.13 04/04/2014 0258   CALCIUM 9.3 09/25/2014 1141   GFRNONAA 71* 04/04/2014 0258   GFRAA 82* 04/04/2014 0258     Hepatic Function Panel     Component Value Date/Time   PROT 7.5 09/25/2014 1141   ALBUMIN 4.3 09/25/2014 1141   AST 19 09/25/2014 1141   ALT 23 09/25/2014 1141  ALKPHOS 70 09/25/2014 1141   BILITOT 0.7 09/25/2014 1141     CBC    Component Value Date/Time   WBC 3.1* 09/04/2014 0922   RBC 5.14 09/04/2014 0922   HGB 13.7 09/04/2014 0922   HCT 41.6 09/04/2014 0922   PLT 141* 09/04/2014 0922   MCV 80.9 09/04/2014 0922   MCH 26.7 09/04/2014 0922   MCHC 32.9 09/04/2014 0922   RDW 15.6* 09/04/2014 0922   LYMPHSABS 1.3 11/01/2012 2112   MONOABS 0.3 11/01/2012 2112   EOSABS 0.1 11/01/2012 2112   BASOSABS 0.0 11/01/2012 2112     BNP    Component Value Date/Time   PROBNP 123.0* 04/03/2007 0605    Lipid Panel     Component Value Date/Time   CHOL 151 09/25/2014 1141   TRIG 122 09/25/2014 1141   HDL 31* 09/25/2014 1141   CHOLHDL 4.9 09/25/2014 1141   VLDL 24 09/25/2014 1141   LDLCALC 96 09/25/2014 1141      RADIOLOGY: Dg Chest 2 View  02/04/2014   CLINICAL DATA:  Chest and left arm pain.  EXAM: CHEST  2 VIEW  COMPARISON:  None.  FINDINGS: The lungs are clear. Heart size is normal. No pneumothorax or pleural effusion. No focal bony abnormality.  IMPRESSION: Negative chest.   Electronically Signed   By: Drusilla Kanner M.D.   On: 02/04/2014 10:53     ASSESSMENT AND PLAN: Mr. Thelma Muck is a 59 year old, African American male who suffered a CVA in 2003.   He has a history of a  documented cardiomyopathy since 2003.  He had developed new chest tightness and increasing shortness of breath.  Cardiac catheterization in August 2015 demonstrated that his his ejection fraction was 15% and he underwent successful intervention to a diffusely diseased diagonal and LAD system.  His RCA is occluded and is collateralized.  He does have very distal circumflex stenosis.  He has not had any recurrent anginal symptoms since his intervention.  Following revascularization.  Left ventricular function.  Continue to be low for which he underwent prophylactic ICD implantation in light of increased sudden cardiac death risk.  Presently, he is not having any signs of CHF on his current medical regimen.  His blood pressure is somewhat low at 90/70.  Rather than he takes lisinopril 5 mg daily.  I've suggested he change this to 2.5 mg twice a day.  He is asymptomatic and denies lightheadedness or palpitations.  He is back on dual antiplatelet therapy with aspirin and Plavix and denies bleeding.  He is on atorvastatin 40 mg and Zetia 10 mg for aggressive lipid lowering therapy with target LDL ideally less than 70.  Laboratory will be rechecked today since he is fasting and adjustments may be necessary in his medical regimen if indicated.  He will see Dr.Croitou in May in follow-up of his ICD implantation.  I will see him for cardiology evaluation follow-up in July/August. Lennette Bihari, MD, [facility] 09/27/2014 8:48 PM

## 2014-10-08 ENCOUNTER — Encounter: Payer: Self-pay | Admitting: *Deleted

## 2014-10-08 ENCOUNTER — Encounter: Payer: Self-pay | Admitting: Cardiovascular Disease

## 2014-10-14 ENCOUNTER — Ambulatory Visit (INDEPENDENT_AMBULATORY_CARE_PROVIDER_SITE_OTHER): Payer: Medicare Other | Admitting: *Deleted

## 2014-10-14 ENCOUNTER — Telehealth: Payer: Self-pay | Admitting: Cardiology

## 2014-10-14 DIAGNOSIS — Z95810 Presence of automatic (implantable) cardiac defibrillator: Secondary | ICD-10-CM

## 2014-10-14 LAB — MDC_IDC_ENUM_SESS_TYPE_INCLINIC
Battery Remaining Longevity: 109.2 mo
Brady Statistic RV Percent Paced: 0.55 %
Date Time Interrogation Session: 20160307153953
HIGH POWER IMPEDANCE MEASURED VALUE: 60 Ohm
Lead Channel Impedance Value: 425 Ohm
Lead Channel Pacing Threshold Amplitude: 1 V
Lead Channel Pacing Threshold Pulse Width: 0.5 ms
Lead Channel Setting Pacing Pulse Width: 0.5 ms
Lead Channel Setting Sensing Sensitivity: 0.5 mV
MDC IDC MSMT LEADCHNL RV PACING THRESHOLD AMPLITUDE: 1 V
MDC IDC MSMT LEADCHNL RV PACING THRESHOLD PULSEWIDTH: 0.5 ms
MDC IDC MSMT LEADCHNL RV SENSING INTR AMPL: 8.9 mV
MDC IDC PG SERIAL: 7229147
MDC IDC SET LEADCHNL RV PACING AMPLITUDE: 3.5 V
Zone Setting Detection Interval: 300 ms

## 2014-10-14 NOTE — Telephone Encounter (Signed)
Pt sent a manual remote. Remote does not show an alert occurred. Prophylactically, I requested pt to come be checked in person.   Pt knows to come to [place] clinic.  Added to today's schedule.

## 2014-10-14 NOTE — Telephone Encounter (Signed)
Pt called and stated he felt his device vibrating. Instructed pt to send manual transmission.

## 2014-10-14 NOTE — Progress Notes (Signed)
Pt thought he felt a vibratory alert. Manual remote was sent--remote did not show alerts. I checked pt in person to verify no alerts---all was clear. Threshold and sensing consistent with previous device measurements. Impedance trends stable over time. No evidence of any ventricular arrhythmias. Histogram distribution appropriate for patient and level of activity. No changes made this session. Device programmed at appropriate safety margins. Device programmed to optimize intrinsic conduction. Estimated longevity 7.3-9.64yrs. ROV as planned w/ Dr. Sallyanne Kuster 12/31/14 (pt knows to go to [facility]).

## 2014-10-17 ENCOUNTER — Encounter: Payer: Self-pay | Admitting: Cardiovascular Disease

## 2014-11-04 ENCOUNTER — Telehealth: Payer: Self-pay | Admitting: Cardiovascular Disease

## 2014-11-04 NOTE — Telephone Encounter (Signed)
Pt is returning Nathan's call °

## 2014-11-04 NOTE — Telephone Encounter (Signed)
Agree: if no swelling, redness or drainage, likely no problem.

## 2014-11-04 NOTE — Telephone Encounter (Signed)
Spoke to patient. No problems since pacemaker was put in February, he states had pain over PM site when he woke this morning.  He states "I slept on the pacemaker", was positioned on it when he woke up & had pain in same shoulder.  This was intermittent "hard pain" throughout the morning. He took a tylenol & this relieved it. Had not had pain this afternoon..  Voiced agreement that could be due to positional sleep; since he has not had issues prior to this, told to call if it returns, will defer to Dr. Sallyanne Kuster for confirmation.

## 2014-11-04 NOTE — Telephone Encounter (Signed)
LMOMTCB

## 2014-11-04 NOTE — Telephone Encounter (Signed)
Pt says the area where his defibrillator is hurting off and on,concerned about it.

## 2014-11-06 ENCOUNTER — Encounter: Payer: Self-pay | Admitting: Cardiovascular Disease

## 2014-11-26 ENCOUNTER — Other Ambulatory Visit: Payer: Self-pay | Admitting: Cardiovascular Disease

## 2014-12-31 ENCOUNTER — Encounter: Payer: Self-pay | Admitting: Cardiovascular Disease

## 2014-12-31 ENCOUNTER — Ambulatory Visit (INDEPENDENT_AMBULATORY_CARE_PROVIDER_SITE_OTHER): Payer: Medicare Other | Admitting: Cardiovascular Disease

## 2014-12-31 VITALS — BP 104/88 | HR 64 | Ht 69.5 in | Wt 188.4 lb

## 2014-12-31 DIAGNOSIS — I4729 Other ventricular tachycardia: Secondary | ICD-10-CM | POA: Insufficient documentation

## 2014-12-31 DIAGNOSIS — I472 Ventricular tachycardia, unspecified: Secondary | ICD-10-CM | POA: Insufficient documentation

## 2014-12-31 DIAGNOSIS — Z95810 Presence of automatic (implantable) cardiac defibrillator: Secondary | ICD-10-CM | POA: Diagnosis not present

## 2014-12-31 DIAGNOSIS — Z9189 Other specified personal risk factors, not elsewhere classified: Secondary | ICD-10-CM | POA: Diagnosis not present

## 2014-12-31 DIAGNOSIS — I255 Ischemic cardiomyopathy: Secondary | ICD-10-CM

## 2014-12-31 LAB — CUP PACEART INCLINIC DEVICE CHECK
Battery Remaining Longevity: 108 mo
Brady Statistic RV Percent Paced: 2.2 %
Date Time Interrogation Session: 20160524091928
HighPow Impedance: 63 Ohm
Lead Channel Pacing Threshold Amplitude: 0.75 V
Lead Channel Pacing Threshold Amplitude: 0.75 V
Lead Channel Pacing Threshold Pulse Width: 0.5 ms
Lead Channel Sensing Intrinsic Amplitude: 10.8 mV
Lead Channel Setting Pacing Amplitude: 2.5 V
Lead Channel Setting Sensing Sensitivity: 0.5 mV
MDC IDC MSMT LEADCHNL RV IMPEDANCE VALUE: 387.5 Ohm
MDC IDC MSMT LEADCHNL RV PACING THRESHOLD PULSEWIDTH: 0.5 ms
MDC IDC SET LEADCHNL RV PACING PULSEWIDTH: 0.5 ms
Pulse Gen Serial Number: 7229147
Zone Setting Detection Interval: 300 ms

## 2014-12-31 NOTE — Patient Instructions (Signed)
Remote monitoring is used to monitor your Pacemaker of ICD from home. This monitoring reduces the number of office visits required to check your device to one time per year. It allows [place] to keep an eye on the functioning of your device to ensure it is working properly. You are scheduled for a device check from home on April 03, 2015. You may send your transmission at any time that day. If you have a wireless device, the transmission will be sent automatically. After your physician reviews your transmission, you will receive a postcard with your next transmission date.  Dr Sallyanne Kuster recommends that you schedule a follow-up appointment in 12 months. You will receive a reminder letter in the mail two months in advance. If you don't receive a letter, please call our office to schedule the follow-up appointment.

## 2014-12-31 NOTE — Progress Notes (Signed)
Patient ID: [name], male   DOB: Jan 28, 1956, 59 y.o.   MRN: [id number]     Cardiology Office Note   Date:  12/31/2014   ID:  JAVEL HERSH, DOB [birth year], MRN [id number]  PCP:  Default, Provider, MD  Cardiologist:  Shelva Majestic, MD;  Sanda Klein, MD   Chief Complaint  Patient presents with  . Follow-up    90 post pacer implant.  No complaints.      History of Present Illness: Oniel A Witter is a 59 y.o. male who presents for follow up 90 day after primary prevention ICD implantation (MADIT-2). The device site is well healed. All device and lead parameters are in appropriate range. There has been one recorded 15 beat run of monomorphic appearing NSVT at 213 bpm (on March 13) that aborted immediately after detection (asymptomatic). Corvue should be mature, shows decreased thoracic impedance over last 16 days, but he denies dyspnea, edema or weight gain.  NYHA class II CHF, last EF 20-25% with scar of extensive LAD distribution MI.  SIngle chamber ICD: Generator [address]. Jude Fortify Assura model [id number] serial number [id number] Right ventricular lead St. Jude Durata [phone] serial number [id number]  Past Medical History  Diagnosis Date  . HIV (human immunodeficiency virus infection)   . Hypertension   . Coronary artery disease     a. cath 04/02/2014 3v dx 80-90% in D1, 80-90% mid LAD, 95% apical LAD, 95% distal inferior LV branch of LCx, total occlusion of prox RCA  b). Cath 04/03/2014 DES to mid LAD, DES x3 to prox D1, DAPT indefinitely. LCx lesion untreated  . Stroke   . Ischemic cardiomyopathy 2015    EF 20-25 percent by echo in 04/2014, s/p [address] Jude [place] model [id number] serial number [id number]    Past Surgical History  Procedure Laterality Date  . Cardiac catheterization  04/02/2014    DR KELLY  . Left and right heart catheterization with coronary angiogram N/A 04/02/2014    Procedure: LEFT AND RIGHT HEART CATHETERIZATION WITH CORONARY ANGIOGRAM;   Surgeon: Troy Sine, MD;  Location: [facility] CATH LAB;  Service: Cardiovascular;  Laterality: N/A;  . Percutaneous coronary stent intervention (pci-s) N/A 04/03/2014    Procedure: PERCUTANEOUS CORONARY STENT INTERVENTION (PCI-S);  Surgeon: Peter M Martinique, MD;  Location: [facility] CATH LAB;  Service: Cardiovascular;  Laterality: N/A;  . Implantable cardioverter defibrillator implant N/A 09/10/2014    Procedure: IMPLANTABLE CARDIOVERTER DEFIBRILLATOR IMPLANT;  Surgeon: Sanda Klein, MD;  [place] [phone] serial number [id number]     Current Outpatient Prescriptions  Medication Sig Dispense Refill  . aspirin 81 MG chewable tablet Chew 81 mg by mouth daily.    [name] atorvastatin (LIPITOR) 40 MG tablet TAKE ONE TABLET BY MOUTH ONCE DAILY 30 tablet 5  . clopidogrel (PLAVIX) 75 MG tablet Take 1 tablet (75 mg total) by mouth daily with breakfast. 30 tablet 0  . clotrimazole (LOTRIMIN) 1 % cream Apply 1 application topically as needed (For itching of foot).    . diphenhydrAMINE (BENADRYL) 25 MG tablet Take 25 mg by mouth 2 (two) times daily.    [name] emtricitabine-tenofovir (TRUVADA) 200-300 MG per tablet Take 1 tablet by mouth daily.    [name] ezetimibe (ZETIA) 10 MG tablet Take 5 mg by mouth daily.     . furosemide (LASIX) 20 MG tablet Take 1 tablet (20 mg total) by mouth daily as needed. 30 tablet 3  . ipratropium (ATROVENT) 0.06 % nasal spray Place 2 sprays into  both nostrils 4 (four) times daily as needed for rhinitis.    [name] isosorbide mononitrate (IMDUR) 30 MG 24 hr tablet Take 1 tablet (30 mg total) by mouth daily. 30 tablet 6  . lisinopril (PRINIVIL,ZESTRIL) 5 MG tablet Take 1 tablet (5 mg total) by mouth daily. (Patient taking differently: Take 10 mg by mouth daily. ) 90 tablet 3  . lopinavir-ritonavir (KALETRA) 200-50 MG per tablet Take 2 tablets by mouth 2 (two) times daily.    [name] loratadine (CLARITIN) 10 MG tablet Take 10 mg by mouth daily as needed for allergies.    . metoprolol (LOPRESSOR) 50  MG tablet Take 1 tablet in the morning and 1/2 tablet at night 180 tablet 3  . pantoprazole (PROTONIX) 40 MG tablet Take 1 tablet (40 mg total) by mouth daily as needed. 30 tablet 0  . raltegravir (ISENTRESS) 400 MG tablet Take 400 mg by mouth 2 (two) times daily.    . risperiDONE (RISPERDAL) 2 MG tablet Take 2 mg by mouth daily.     . traZODone (DESYREL) 100 MG tablet Take 100 mg by mouth at bedtime as needed.      No current facility-administered medications for this visit.    Allergies:   Ivp dye    Social History:  The patient  reports that he has never smoked. He has never used smokeless tobacco. He reports that he does not drink alcohol or use illicit drugs.   Family History:  The patient's family history includes Heart disease in his mother.    ROS:  Please see the history of present illness.    Otherwise, review of systems positive for none.   All other systems are reviewed and negative.    PHYSICAL EXAM: VS:  BP 104/88 mmHg  Pulse 64  Ht 5' 9.5" (1.765 m)  Wt 85.458 kg (188 lb 6.4 oz)  BMI 27.43 kg/m2 , BMI Body mass index is 27.43 kg/(m^2).  General: Alert, oriented x3, no distress Head: no evidence of trauma, PERRL, EOMI, no exophtalmos or lid lag, no myxedema, no xanthelasma; normal ears, nose and oropharynx Neck: normal jugular venous pulsations and no hepatojugular reflux; brisk carotid pulses without delay and no carotid bruits Chest: clear to auscultation, no signs of consolidation by percussion or palpation, normal fremitus, symmetrical and full respiratory excursions. Healed ICD site Cardiovascular: laterally displaced apical impulse, regular rhythm, normal first and second heart sounds, no murmurs, rubs or gallops Abdomen: no tenderness or distention, no masses by palpation, no abnormal pulsatility or arterial bruits, normal bowel sounds, no hepatosplenomegaly Extremities: no clubbing, cyanosis or edema; 2+ radial, ulnar and brachial pulses bilaterally; 2+ right  femoral, posterior tibial and dorsalis pedis pulses; 2+ left femoral, posterior tibial and dorsalis pedis pulses; no subclavian or femoral bruits Neurological: grossly nonfocal Psych: euthymic mood, full affect   EKG:  EKG is not ordered today.  Recent Labs: 09/04/2014: Hemoglobin 13.7; Platelets 141* 09/25/2014: ALT 23; BUN 16; Creatinine 1.14; Potassium 4.2; Sodium 139; TSH 0.917    Lipid Panel    Component Value Date/Time   CHOL 151 09/25/2014 1141   TRIG 122 09/25/2014 1141   HDL 31* 09/25/2014 1141   CHOLHDL 4.9 09/25/2014 1141   VLDL 24 09/25/2014 1141   LDLCALC 96 09/25/2014 1141      Wt Readings from Last 3 Encounters:  12/31/14 85.458 kg (188 lb 6.4 oz)  09/25/14 84.823 kg (187 lb)  09/10/14 83.008 kg (183 lb)     ASSESSMENT AND PLAN:  Normal  single chamber ICD with detected NSVT, but without delivered therapy to date. Discussed "shock plan" and [place] remote follow up. Will see yearly in device clinic, otherwise continue follow up with Dr. Claiborne Billings. Corvue may not yet be fully mature - will monitor. Clinically no reason to adjust diuretic today, but asked hime to be very judicious with sodium restriction and to weight daily.   Current medicines are reviewed at length with the patient today.  The patient does not have concerns regarding medicines.  The following changes have been made:  no change  Labs/ tests ordered today include:  Orders Placed This Encounter  Procedures  . Implantable device check   Patient Instructions  Remote monitoring is used to monitor your Pacemaker of ICD from home. This monitoring reduces the number of office visits required to check your device to one time per year. It allows [place] to keep an eye on the functioning of your device to ensure it is working properly. You are scheduled for a device check from home on April 03, 2015. You may send your transmission at any time that day. If you have a wireless device, the transmission will be  sent automatically. After your physician reviews your transmission, you will receive a postcard with your next transmission date.  Dr Sallyanne Kuster recommends that you schedule a follow-up appointment in 12 months. You will receive a reminder letter in the mail two months in advance. If you don't receive a letter, please call our office to schedule the follow-up appointment.    Mikael Spray, MD  12/31/2014 2:51 PM    Sanda Klein, MD, [facility] [phone] office [phone] pager

## 2015-01-02 ENCOUNTER — Telehealth: Payer: Self-pay | Admitting: Cardiovascular Disease

## 2015-01-02 NOTE — Telephone Encounter (Signed)
Pt said he was suppose to have gone to some class yesterday about his defibrillator.He lost hi letter,now he does not know where to go.

## 2015-01-02 NOTE — Telephone Encounter (Signed)
Informed pt that support group meetings are held every six months. I informed pt that I would see if Butch Penny had any extra material that was coverd at 01-01-15 support group meeting and if she did I would have her mail it to him. Pt verbalized understanding.

## 2015-01-09 ENCOUNTER — Encounter: Payer: Self-pay | Admitting: Cardiovascular Disease

## 2015-01-09 ENCOUNTER — Ambulatory Visit (INDEPENDENT_AMBULATORY_CARE_PROVIDER_SITE_OTHER): Payer: Medicare Other | Admitting: Cardiovascular Disease

## 2015-01-09 VITALS — BP 102/70 | HR 65 | Ht 69.5 in | Wt 190.6 lb

## 2015-01-09 DIAGNOSIS — B20 Human immunodeficiency virus [HIV] disease: Secondary | ICD-10-CM

## 2015-01-09 DIAGNOSIS — Z95810 Presence of automatic (implantable) cardiac defibrillator: Secondary | ICD-10-CM | POA: Diagnosis not present

## 2015-01-09 DIAGNOSIS — I2510 Atherosclerotic heart disease of native coronary artery without angina pectoris: Secondary | ICD-10-CM

## 2015-01-09 DIAGNOSIS — I10 Essential (primary) hypertension: Secondary | ICD-10-CM

## 2015-01-09 DIAGNOSIS — R079 Chest pain, unspecified: Secondary | ICD-10-CM

## 2015-01-09 DIAGNOSIS — I255 Ischemic cardiomyopathy: Secondary | ICD-10-CM

## 2015-01-09 DIAGNOSIS — I4729 Other ventricular tachycardia: Secondary | ICD-10-CM

## 2015-01-09 DIAGNOSIS — I472 Ventricular tachycardia: Secondary | ICD-10-CM

## 2015-01-09 MED ORDER — LISINOPRIL 2.5 MG PO TABS: 2.5000 mg | ORAL_TABLET | Freq: Every day | ORAL | Status: DC

## 2015-01-09 NOTE — Patient Instructions (Signed)
Your physician has recommended you make the following change in your medication: lisinopril has been reduced to 2.5 mg at night. A new prescription for the 2.5 mg tablets has been sent to your pharmacy to reflect this change.   Your physician wants you to follow-up in: 6 months or sooner if needed. You will receive a reminder letter in the mail two months in advance. If you don't receive a letter, please call our office to schedule the follow-up appointment.

## 2015-01-11 ENCOUNTER — Encounter: Payer: Self-pay | Admitting: Cardiovascular Disease

## 2015-01-11 NOTE — Progress Notes (Signed)
Patient ID: Quintin Alto, male   DOB: February 10, 1956, 59 y.o.   MRN: [id number]    HPI:  Nichole Fyffe Gulino is a 59 y.o. male who presents for a 4 month follow-up cardiology evaluation.   Mr. Kerkstra suffered a CVA in 2003.  He has been followed at the [facility] hospital.   In 2003 he underwent cardiac catheterization which  which did not reveal significant coronary artery disease. Prior to catheterization  ejection fraction was  33% with multiple areas of scar noted on a nuclear perfusion study.   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  He states that for the past year 5 years.  He has had undetected virus.  He has a history of hypertension, as well as hyperlipidemia.  There is also a history of depression, for which he takes Risperdal.  I initially saw him with complaints of some episodes of left-sided chest pain with walking, associated with left arm numbness and increasing shortness of breath with activity and is referred for nuclear study. He underwent a recent nuclear perfusion study on 03/01/2014.  Ejection fraction was about 27%.  This showed extensive scar multiple coronary territories without definitive reversible ischemia.  An echo Doppler study, which was done on 03/01/2014 showed an ejection fraction of 20-25% with severe global hypokinesis, but more pronounced inferior hypokinesis.  He underwent cardiac catheterization by me on 04/02/2014 and was found to have severe  cardiomyopathy with ejection fraction of approximately 15%.  He had multivessel CAD with 80 and 90% stenoses in the first diagonal branch of the LAD, 80-90% LAD stenoses after the first septal perforating artery, and 95% apical LAD stenosis.  There is a 95% distal inferior LV branch stenosis of the distal circumflex and the RCA was totally occluded proximally with left to right collaterals.  He had normal right heart pressures and there was no evidence for  Polaner.  Hypertension.  He underwent surgical evaluation and was turned down.  The following day he underwent successful intervention to his diagonal and LAD by Dr. Swaz[place]and with insertion of a 3.0x38 mm Promus DES stent in the mid LAD and 3 stents placed in the diagonal vessel.   I referred him to Dr. Royann Shivers for implantation of a cardio defibrillator.  This was done on 09/10/2014 and a single-chamber cardio vertigo defibrillator was inserted.   He saw Dr. Rubie Maid on 12/31/2014 for follow-up evaluation. Interrogation revealed NSVT but without delivered therapy to date. He is participating in [place] remote follow-up.  From a cardiac standpoint, he denies any recent increasing shortness of breath.  He denies any recurrent anginal symptoms since his coronary intervention.  He has concomitant CAD, which is being treated with medical therapy.  He denies bleeding.  He presents for follow-up cardiology evaluation.    Past Medical History  Diagnosis Date  . HIV (human immunodeficiency virus infection)   . Hypertension   . Coronary artery disease     a. cath 04/02/2014 3v dx 80-90% in D1, 80-90% mid LAD, 95% apical LAD, 95% distal inferior LV branch of LCx, total occlusion of prox RCA  b). Cath 04/03/2014 DES to mid LAD, DES x3 to prox D1, DAPT indefinitely. LCx lesion untreated  . Stroke   . Ischemic cardiomyopathy 2015    EF 20-25 percent by echo in 04/2014, s/p [address] Jude [place] model [id number] serial number [id number]    Past Surgical History  Procedure Laterality Date  .  Cardiac catheterization  04/02/2014    DR Eliceo Gladu  . Left and right heart catheterization with coronary angiogram N/A 04/02/2014    Procedure: LEFT AND RIGHT HEART CATHETERIZATION WITH CORONARY ANGIOGRAM;  Surgeon: Lennette Bihari, MD;  Location: [facility] CATH LAB;  Service: Cardiovascular;  Laterality: N/A;  . Percutaneous coronary stent intervention (pci-s) N/A 04/03/2014    Procedure: PERCUTANEOUS CORONARY STENT INTERVENTION (PCI-S);   Surgeon: Peter M Swaziland, MD;  Location: [facility] CATH LAB;  Service: Cardiovascular;  Laterality: N/A;  . Implantable cardioverter defibrillator implant N/A 09/10/2014    Procedure: IMPLANTABLE CARDIOVERTER DEFIBRILLATOR IMPLANT;  Surgeon: Thurmon Fair, MD;  [facility] model [phone] serial number [id number]    Allergies  Allergen Reactions  . Ivp Dye [Iodinated Diagnostic Agents] Hives    Current Outpatient Prescriptions  Medication Sig Dispense Refill  . aspirin 81 MG chewable tablet Chew 81 mg by mouth daily.    [name] atorvastatin (LIPITOR) 40 MG tablet TAKE ONE TABLET BY MOUTH ONCE DAILY 30 tablet 5  . clopidogrel (PLAVIX) 75 MG tablet Take 1 tablet (75 mg total) by mouth daily with breakfast. 30 tablet 0  . clotrimazole (LOTRIMIN) 1 % cream Apply 1 application topically as needed (For itching of foot).    . diphenhydrAMINE (BENADRYL) 25 MG tablet Take 25 mg by mouth 2 (two) times daily.    [name] emtricitabine-tenofovir (TRUVADA) 200-300 MG per tablet Take 1 tablet by mouth daily.    [name] ezetimibe (ZETIA) 10 MG tablet Take 5 mg by mouth daily.     . furosemide (LASIX) 20 MG tablet Take 1 tablet (20 mg total) by mouth daily as needed. 30 tablet 3  . ipratropium (ATROVENT) 0.06 % nasal spray Place 2 sprays into both nostrils 4 (four) times daily as needed for rhinitis.    [name] isosorbide mononitrate (IMDUR) 30 MG 24 hr tablet Take 1 tablet (30 mg total) by mouth daily. 30 tablet 6  . lopinavir-ritonavir (KALETRA) 200-50 MG per tablet Take 2 tablets by mouth 2 (two) times daily.    [name] loratadine (CLARITIN) 10 MG tablet Take 10 mg by mouth daily as needed for allergies.    . metoprolol (LOPRESSOR) 50 MG tablet Take 1 tablet in the morning and 1/2 tablet at night 180 tablet 3  . pantoprazole (PROTONIX) 40 MG tablet Take 1 tablet (40 mg total) by mouth daily as needed. 30 tablet 0  . raltegravir (ISENTRESS) 400 MG tablet Take 400 mg by mouth 2 (two) times daily.    . risperiDONE (RISPERDAL) 2 MG  tablet Take 2 mg by mouth daily.     . traZODone (DESYREL) 100 MG tablet Take 100 mg by mouth at bedtime as needed.     [name] lisinopril (PRINIVIL,ZESTRIL) 2.5 MG tablet Take 1 tablet (2.5 mg total) by mouth at bedtime. 30 tablet 6   No current facility-administered medications for this visit.    Socially, he is married for 25 years.  Has one child.  He is disabled and previously had worked as a Naval architect.  He completed 12th grade education.  There is no tobacco use.  He does drink alcohol.  Family History  Problem Relation Age of Onset  . Heart disease Mother     ROS General: Negative; No fevers, chills, or night sweats HEENT: Negative; No changes in vision or hearing, sinus congestion, difficulty swallowing Pulmonary: Negative; No cough, wheezing, shortness of breath, hemoptysis Cardiovascular:  See HPI; No chest pain, presyncope, syncope, palpitations, edema GI: Negative; No  nausea, vomiting, diarrhea, or abdominal pain GU: Negative; No dysuria, hematuria, or difficulty voiding Musculoskeletal: Negative; no myalgias, joint pain, or weakness Hematologic/Oncologic: Positive for HIV well-controlled on chronic therapy for years; no easy bruising, bleeding Endocrine: Negative; no heat/cold intolerance; no diabetes Neuro: Negative; no changes in balance, headaches Skin: Negative; No rashes or skin lesions Psychiatric: History of depression. No behavioral problems,  Sleep: Negative; No daytime sleepiness, hypersomnolence, bruxism, restless legs, hypnogagnic hallucinations Other comprehensive 14 point system review is negative   Physical Exam BP 102/70 mmHg  Pulse 65  Ht 5' 9.5" (1.765 m)  Wt 86.456 kg (190 lb 9.6 oz)  BMI 27.75 kg/m2  Repeat blood pressure by me was 102/70 supine and 86/66 standing.  Pulse was regular in the 60s without significant orthostatic pulse rise  Wt Readings from Last 3 Encounters:  01/09/15 86.456 kg (190 lb 9.6 oz)  12/31/14 85.458 kg (188 lb 6.4 oz)    09/25/14 84.823 kg (187 lb)   General: Alert, oriented, no distress.  Skin: normal turgor, no rashes, warm and dry HEENT: Normocephalic, atraumatic. Pupils equal round and reactive to light; sclera anicteric; extraocular muscles intact; Fundi poorly visualized, but without hemorrhages or exudates. Nose without nasal septal hypertrophy Mouth/Parynx benign; Mallinpatti scale 2 Neck: No JVD, no carotid bruits; normal carotid upstroke Lungs: clear to ausculatation and percussion; no wheezing or rales Chest wall: without tenderness to palpitation; no ecchymosis or induration at the ICD implantation site. Heart: PMI not displaced, RRR, s1 s2 normal, 1/6 systolic murmur, no diastolic murmur, no rubs, gallops, thrills, or heaves Abdomen: soft, nontender; no hepatosplenomehaly, BS+; abdominal aorta nontender and not dilated by palpation. Rectal exam performed by me: Stool guaiac-negative.  Prostate without nodules. Back: no CVA tenderness Pulses 2+ Musculoskeletal: full range of motion, normal strength, no joint deformities Extremities: no clubbing cyanosis or edema, Homan's sign negative  Neurologic: grossly nonfocal; Cranial nerves grossly wnl Psychologic: Normal mood and affect  ECG (independently read by me):  Normal sinus rhythm at 65 bpm with an occasional PVC with VA conduction  February 2016ECG (independently read by me): Normal sinus rhythm at 63 bpm.  Possible old inferior infarct.  Lateral T-wave changes.  Right superior axis  November 2015 ECG (independently read by me):  Normal sinus rhythm.  Probable old IMI.  T-wave changes V3 through V6.  ECG (independently read by me): normal sinus rhythm at 64 beats per minute.  T-wave inversion V3 through V6,  lead 1, 2 and aVF.  Prior 02/20/2014 ECG (independently read by me): Sinus rhythm with frequent PVCs with transient trigeminy, and evidence for VA conduction with his ectopy.  LABS:  BMP Latest Ref Rng 09/25/2014 09/04/2014 04/23/2014   Glucose 70 - 99 mg/dL 80 84 89  BUN 6 - 23 mg/dL 16 14 17   Creatinine 0.50 - 1.35 mg/dL 8.29 5.62 1.30  Sodium 135 - 145 mEq/L 139 137 135  Potassium 3.5 - 5.3 mEq/L 4.2 4.0 4.3  Chloride 96 - 112 mEq/L 105 105 103  CO2 19 - 32 mEq/L 22 24 25   Calcium 8.4 - 10.5 mg/dL 9.3 8.9 9.1   Hepatic Function Latest Ref Rng 09/25/2014 09/04/2014 04/23/2014  Total Protein 6.0 - 8.3 g/dL 7.5 7.1 7.3  Albumin 3.5 - 5.2 g/dL 4.3 4.2 4.3  AST 0 - 37 U/L 19 18 20   ALT 0 - 53 U/L 23 22 25   Alk Phosphatase 39 - 117 U/L 70 61 75  Total Bilirubin 0.2 - 1.2 mg/dL 0.7 0.8  0.9   CBC Latest Ref Rng 09/04/2014 04/23/2014 04/04/2014  WBC 4.0 - 10.5 K/uL 3.1(L) 3.5(L) 8.4  Hemoglobin 13.0 - 17.0 g/dL 11.9 14.7 12.5(L)  Hematocrit 39.0 - 52.0 % 41.6 39.4 35.9(L)  Platelets 150 - 400 K/uL 141(L) 148(L) 149(L)   Lab Results  Component Value Date   MCV 80.9 09/04/2014   MCV 78.3 04/23/2014   MCV 78.7 04/04/2014   Lab Results  Component Value Date   TSH 0.917 09/25/2014   Lipid Panel     Component Value Date/Time   CHOL 151 09/25/2014 1141   TRIG 122 09/25/2014 1141   HDL 31* 09/25/2014 1141   CHOLHDL 4.9 09/25/2014 1141   VLDL 24 09/25/2014 1141   LDLCALC 96 09/25/2014 1141    RADIOLOGY: Dg Chest 2 View  02/04/2014   CLINICAL DATA:  Chest and left arm pain.  EXAM: CHEST  2 VIEW  COMPARISON:  None.  FINDINGS: The lungs are clear. Heart size is normal. No pneumothorax or pleural effusion. No focal bony abnormality.  IMPRESSION: Negative chest.   Electronically Signed   By: Drusilla Kanner M.D.   On: 02/04/2014 10:53     ASSESSMENT AND PLAN: Mr. Burt Narez is a 59 year old, African American male who suffered a CVA in 2003.   He has a history of a cardiomyopathy that has been documentedsince 2003.  He had developed new chest tightness and increasing shortness of breath which led to cardiac catheterization in August 2015.  His ejection fraction was 15% and he underwent successful intervention to a  diffusely diseased diagonal and LAD system.  His RCA is occluded and is collateralized.  He does have very distal circumflex stenosis.  He has not had any recurrent anginal symptoms since his intervention.  Following revascularization LV function remained low and  he underwent prophylactic ICD implantation in light of increased sudden cardiac death risk.  He has not had any defibrillator shocks, but has been noted to have some brief episodes of NSVT.  His blood pressure today is low and mildly orthostatic.  I am reducing his lisinopril dose from 5 mg to 2.5 mg daily.   He is on Lipitor 40 mg daily and Zetia 10 mg for hyperlipidemia. He is underwent a platelet therapy.  There is no bleeding.  He continues to take his HIV medications. He tells me he will be going to the [facility] and laboratory will be checked in July. I will last that these be sent for my review.  I will see him in 6 months for reevaluation.   Time spent: 25 minutes  Lennette Bihari, MD, [facility] 01/11/2015 1:10 PM

## 2015-01-21 ENCOUNTER — Encounter: Payer: Self-pay | Admitting: Cardiovascular Disease

## 2015-03-16 DIAGNOSIS — M5416 Radiculopathy, lumbar region: Secondary | ICD-10-CM | POA: Diagnosis not present

## 2015-03-24 ENCOUNTER — Ambulatory Visit (INDEPENDENT_AMBULATORY_CARE_PROVIDER_SITE_OTHER): Payer: Medicare Other | Admitting: Cardiovascular Disease

## 2015-03-24 ENCOUNTER — Encounter: Payer: Self-pay | Admitting: Cardiovascular Disease

## 2015-03-24 VITALS — BP 90/64 | HR 67 | Ht 69.5 in | Wt 190.5 lb

## 2015-03-24 DIAGNOSIS — I2510 Atherosclerotic heart disease of native coronary artery without angina pectoris: Secondary | ICD-10-CM | POA: Diagnosis not present

## 2015-03-24 DIAGNOSIS — E785 Hyperlipidemia, unspecified: Secondary | ICD-10-CM

## 2015-03-24 DIAGNOSIS — Z9189 Other specified personal risk factors, not elsewhere classified: Secondary | ICD-10-CM | POA: Diagnosis not present

## 2015-03-24 DIAGNOSIS — I255 Ischemic cardiomyopathy: Secondary | ICD-10-CM

## 2015-03-24 DIAGNOSIS — Z95810 Presence of automatic (implantable) cardiac defibrillator: Secondary | ICD-10-CM | POA: Diagnosis not present

## 2015-03-24 DIAGNOSIS — B20 Human immunodeficiency virus [HIV] disease: Secondary | ICD-10-CM

## 2015-03-24 DIAGNOSIS — I472 Ventricular tachycardia: Secondary | ICD-10-CM

## 2015-03-24 DIAGNOSIS — I200 Unstable angina: Secondary | ICD-10-CM

## 2015-03-24 DIAGNOSIS — I4729 Other ventricular tachycardia: Secondary | ICD-10-CM

## 2015-03-24 MED ORDER — ISOSORBIDE MONONITRATE ER 30 MG PO TB24: 15.0000 mg | ORAL_TABLET | Freq: Every day | ORAL | Status: DC

## 2015-03-24 NOTE — Progress Notes (Signed)
Patient ID: Richard [name], male   DOB: August 17, 1955, 59 y.o.   MRN: [id number]    HPI:  Richard [name] is a 59 y.o. male who presents for a 2 1/2 month follow-up cardiology evaluation.   Richard [name] has a history of a CVA in 2003 and has been followed at the [facility].   In 2003 Richard [name] underwent cardiac catheterization which  which did not reveal significant coronary artery disease. Prior to catheterization  ejection fraction was 33% with multiple areas of scar noted on a nuclear perfusion study.   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  Richard [name] states that for the past year 5 years Richard [name] has had undetected virus.  Richard [name] has a history of hypertension, as well as hyperlipidemia.  There is also a history of depression, for which Richard [name] takes Risperdal.  I initially saw him with complaints of some episodes of left-sided chest pain with walking, associated with left arm numbness and increasing shortness of breath with activity and is referred for nuclear study. Richard [name] underwent a recent nuclear perfusion study on 03/01/2014.  Ejection fraction was about 27%.  This showed extensive scar multiple coronary territories without definitive reversible ischemia.  An echo Doppler study  on 03/01/2014 showed an ejection fraction of 20-25% with severe global hypokinesis, but more pronounced inferior hypokinesis.  Richard [name] underwent cardiac catheterization by me on 04/02/2014 and was found to have severe  cardiomyopathy with ejection fraction of approximately 15%.  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  There is a 95% distal inferior LV branch stenosis of the distal circumflex and the RCA was totally occluded proximally with left to right collaterals.  Richard [name] had normal right heart pressures and there was no evidence for Polaner.   Hypertension.  Richard [name] underwent surgical evaluation and was turned down.  The following day Richard [name] underwent successful intervention to his diagonal and LAD by Dr. Martinique with insertion of a 3.0x38 mm Promus DES stent in the mid LAD and 3 stents placed in the diagonal vessel.  I referred him to Dr. Sallyanne Kuster for implantation of a cardio defibrillator and a single-chamber ICD was inserted on 09/10/2014.   Richard [name] saw Dr. Recardo Evangelist on 12/31/2014 for follow-up evaluation. Interrogation revealed NSVT but without delivered therapy to date. Richard [name] is participating in [facility] remote follow-up.  From a cardiac standpoint, Richard [name] denies any recent increasing shortness of breath.  Richard [name] denies any recurrent anginal symptoms since his coronary intervention.  Richard [name] has concomitant CAD, which is being treated with medical therapy.  Richard [name] denies bleeding.   Since I last saw him, Richard [name] states his blood pressure has been low.  Richard [name] is evaluated at the [facility] every 3-4 months for his HIV.  Richard [name] is unaware of palpitations.  Richard [name] denies presyncope or syncope.  Past Medical History  Diagnosis Date  . HIV (human immunodeficiency virus infection)   . Hypertension   . Coronary artery disease     a. cath 04/02/2014 3v dx 80-90% in D1, 80-90% mid LAD, 95% apical LAD, 95% distal inferior LV branch of LCx, total occlusion of prox RCA  b). Cath 04/03/2014 DES to mid LAD, DES x3 to prox D1, DAPT indefinitely. LCx lesion untreated  . Stroke   . Ischemic cardiomyopathy 2015    EF 20-25 percent by  echo in 04/2014, s/p [address]. Jude Fort[employer] model [id number] serial number [id number]    Past Surgical History  Procedure Laterality Date  . Cardiac catheterization  04/02/2014    DR KELLY  . Left and right heart catheterization with coronary angiogram N/A 04/02/2014    Procedure: LEFT AND RIGHT HEART CATHETERIZATION WITH CORONARY ANGIOGRAM;  Surgeon: Troy Sine, MD;  Location: [facility] CATH LAB;  Service: Cardiovascular;  Laterality: N/A;  . Percutaneous coronary stent  intervention (pci-s) N/A 04/03/2014    Procedure: PERCUTANEOUS CORONARY STENT INTERVENTION (PCI-S);  Surgeon: Peter M Martinique, MD;  Location: [facility] CATH LAB;  Service: Cardiovascular;  Laterality: N/A;  . Implantable cardioverter defibrillator implant N/A 09/10/2014    Procedure: IMPLANTABLE CARDIOVERTER DEFIBRILLATOR IMPLANT;  Surgeon: Sanda Klein, MD;  [place] [phone] serial number [id number]    Allergies  Allergen Reactions  . Ivp Dye [Iodinated Diagnostic Agents] Hives    Current Outpatient Prescriptions  Medication Sig Dispense Refill  . aspirin 81 MG chewable tablet Chew 81 mg by mouth daily.    [name] atorvastatin (LIPITOR) 40 MG tablet TAKE ONE TABLET BY MOUTH ONCE DAILY 30 tablet 5  . clopidogrel (PLAVIX) 75 MG tablet Take 1 tablet (75 mg total) by mouth daily with breakfast. 30 tablet 0  . clotrimazole (LOTRIMIN) 1 % cream Apply 1 application topically as needed (For itching of foot).    . diphenhydrAMINE (BENADRYL) 25 MG tablet Take 25 mg by mouth 2 (two) times daily.    [name] emtricitabine-tenofovir (TRUVADA) 200-300 MG per tablet Take 1 tablet by mouth daily.    [name] ezetimibe (ZETIA) 10 MG tablet Take 5 mg by mouth daily.     . furosemide (LASIX) 20 MG tablet Take 1 tablet (20 mg total) by mouth daily as needed. 30 tablet 3  . ipratropium (ATROVENT) 0.06 % nasal spray Place 2 sprays into both nostrils 4 (four) times daily as needed for rhinitis.    [name] isosorbide mononitrate (IMDUR) 30 MG 24 hr tablet Take 0.5 tablets (15 mg total) by mouth daily. 30 tablet 6  . lisinopril (PRINIVIL,ZESTRIL) 2.5 MG tablet Take 1 tablet (2.5 mg total) by mouth at bedtime. 30 tablet 6  . lopinavir-ritonavir (KALETRA) 200-50 MG per tablet Take 2 tablets by mouth 2 (two) times daily.    [name] loratadine (CLARITIN) 10 MG tablet Take 10 mg by mouth daily as needed for allergies.    . metoprolol (LOPRESSOR) 50 MG tablet Take 1 tablet in the morning and 1/2 tablet at night 180 tablet 3  .  pantoprazole (PROTONIX) 40 MG tablet Take 1 tablet (40 mg total) by mouth daily as needed. 30 tablet 0  . raltegravir (ISENTRESS) 400 MG tablet Take 400 mg by mouth 2 (two) times daily.    . risperiDONE (RISPERDAL) 2 MG tablet Take 2 mg by mouth daily.     . traZODone (DESYREL) 100 MG tablet Take 100 mg by mouth at bedtime as needed.      No current facility-administered medications for this visit.    Socially, Richard [name] is married for 25 years.  Has one child.  Richard [name] is disabled and previously had worked as a Administrator.  Richard [name] completed 12th grade education.  There is no tobacco use.  Richard [name] does drink alcohol.  Family History  Problem Relation Age of Onset  . Heart disease Mother     ROS General: Negative; No fevers, chills, or night sweats HEENT: Negative; No changes in vision or hearing, sinus congestion,  difficulty swallowing Pulmonary: Negative; No cough, wheezing, shortness of breath, hemoptysis Cardiovascular:  See HPI; No chest pain, presyncope, syncope, palpitations, edema GI: Negative; No nausea, vomiting, diarrhea, or abdominal pain GU: Negative; No dysuria, hematuria, or difficulty voiding Musculoskeletal: Negative; no myalgias, joint pain, or weakness Hematologic/Oncologic: Positive for HIV well-controlled on chronic therapy for years; no easy bruising, bleeding Endocrine: Negative; no heat/cold intolerance; no diabetes Neuro: Negative; no changes in balance, headaches Skin: Negative; No rashes or skin lesions Psychiatric: History of depression. No behavioral problems,  Sleep: Negative; No daytime sleepiness, hypersomnolence, bruxism, restless legs, hypnogagnic hallucinations Other comprehensive 14 point system review is negative   Physical Exam BP 90/64 mmHg  Pulse 67  Ht 5' 9.5" (1.765 m)  Wt 190 lb 8 oz (86.41 kg)  BMI 27.74 kg/m2  Repeat blood pressure by me was 10/68 supine and 92/66 standing.  Pulse was regular in the 60s without significant orthostatic pulse rise.  Wt  Readings from Last 3 Encounters:  03/24/15 190 lb 8 oz (86.41 kg)  01/09/15 190 lb 9.6 oz (86.456 kg)  12/31/14 188 lb 6.4 oz (85.458 kg)   General: Alert, oriented, no distress.  Skin: normal turgor, no rashes, warm and dry HEENT: Normocephalic, atraumatic. Pupils equal round and reactive to light; sclera anicteric; extraocular muscles intact; Fundi poorly visualized, but without hemorrhages or exudates. Nose without nasal septal hypertrophy Mouth/Parynx benign; Mallinpatti scale 2 Neck: No JVD, no carotid bruits; normal carotid upstroke Lungs: clear to ausculatation and percussion; no wheezing or rales Chest wall: without tenderness to palpitation; no ecchymosis or induration at the ICD implantation site. Heart: PMI not displaced, RRR, s1 s2 normal, 1/6 systolic murmur, no diastolic murmur, no rubs, gallops, thrills, or heaves Abdomen: soft, nontender; no hepatosplenomehaly, BS+; abdominal aorta nontender and not dilated by palpation. Rectal exam performed by me: Stool guaiac-negative.  Prostate without nodules. Back: no CVA tenderness Pulses 2+ Musculoskeletal: full range of motion, normal strength, no joint deformities Extremities: no clubbing cyanosis or edema, Homan's sign negative  Neurologic: grossly nonfocal; Cranial nerves grossly wnl Psychologic: Normal mood and affect  ECG (independently read by me): Sinus rhythm at 67 bpm.  Isolated PVC.  Right superior axis.  Anterolateral T-wave changes.  June 2016 ECG (independently read by me):  Normal sinus rhythm at 65 bpm with an occasional PVC with VA conduction  February 2016ECG (independently read by me): Normal sinus rhythm at 63 bpm.  Possible old inferior infarct.  Lateral T-wave changes.  Right superior axis  November 2015 ECG (independently read by me):  Normal sinus rhythm.  Probable old IMI.  T-wave changes V3 through V6.  ECG (independently read by me): normal sinus rhythm at 64 beats per minute.  T-wave inversion V3  through V6,  lead 1, 2 and aVF.  Prior 02/20/2014 ECG (independently read by me): Sinus rhythm with frequent PVCs with transient trigeminy, and evidence for VA conduction with his ectopy.  LABS:  BMP Latest Ref Rng 09/25/2014 09/04/2014 04/23/2014  Glucose 70 - 99 mg/dL 80 84 89  BUN 6 - 23 mg/dL 16 14 17   Creatinine 0.50 - 1.35 mg/dL 1.14 1.17 1.22  Sodium 135 - 145 mEq/L 139 137 135  Potassium 3.5 - 5.3 mEq/L 4.2 4.0 4.3  Chloride 96 - 112 mEq/L 105 105 103  CO2 19 - 32 mEq/L 22 24 25   Calcium 8.4 - 10.5 mg/dL 9.3 8.9 9.1   Hepatic Function Latest Ref Rng 09/25/2014 09/04/2014 04/23/2014  Total Protein 6.0 - 8.3  g/dL 7.5 7.1 7.3  Albumin 3.5 - 5.2 g/dL 4.3 4.2 4.3  AST 0 - 37 U/L 19 18 20   ALT 0 - 53 U/L 23 22 25   Alk Phosphatase 39 - 117 U/L 70 61 75  Total Bilirubin 0.2 - 1.2 mg/dL 0.7 0.8 0.9   CBC Latest Ref Rng 09/04/2014 04/23/2014 04/04/2014  WBC 4.0 - 10.5 K/uL 3.1(L) 3.5(L) 8.4  Hemoglobin 13.0 - 17.0 g/dL 13.7 13.6 12.5(L)  Hematocrit 39.0 - 52.0 % 41.6 39.4 35.9(L)  Platelets 150 - 400 K/uL 141(L) 148(L) 149(L)   Lab Results  Component Value Date   MCV 80.9 09/04/2014   MCV 78.3 04/23/2014   MCV 78.7 04/04/2014   Lab Results  Component Value Date   TSH 0.917 09/25/2014   Lipid Panel     Component Value Date/Time   CHOL 151 09/25/2014 1141   TRIG 122 09/25/2014 1141   HDL 31* 09/25/2014 1141   CHOLHDL 4.9 09/25/2014 1141   VLDL 24 09/25/2014 1141   LDLCALC 96 09/25/2014 1141    RADIOLOGY: Dg Chest 2 View  02/04/2014   CLINICAL DATA:  Chest and left arm pain.  EXAM: CHEST  2 VIEW  COMPARISON:  None.  FINDINGS: The lungs are clear. Heart size is normal. No pneumothorax or pleural effusion. No focal bony abnormality.  IMPRESSION: Negative chest.   Electronically Signed   By: Inge Rise M.D.   On: 02/04/2014 10:53     ASSESSMENT AND PLAN: Richard [name] is a 59 year old, African American male who suffered a CVA in 2003.   Richard [name] has a history of a  cardiomyopathy  Documented since 2003.  Richard [name] developed new chest tightness and increasing shortness of breath which led to cardiac catheterization in August 2015.  His ejection fraction was 15% and Richard [name] underwent successful intervention to a diffusely diseased diagonal and LAD system.  His RCA is occluded and is collateralized.  Richard [name] does have very distal circumflex stenosis.  Richard [name] has not had any recurrent anginal symptoms since his intervention.  Following revascularization LV function remained low and  Richard [name] underwent prophylactic ICD implantation in light of increased sudden cardiac death risk.  Richard [name] has not had any defibrillator shocks, but has been noted to have some brief episodes of NSVT.  When I last saw him, his blood pressure was low and I reduced his lisinopril from 5 mg to 2.5 mg daily.  His blood pressure continues to be on the low side today.  Richard [name]'s not having any anginal symptomatology and I will reduce his isosorbide mononitrate from 30 mg to 15 mg.  Richard [name] will continue with his current dose of metoprolol 50 mg in the morning and 25 mg at night and will continue with the lisinopril 2.5 mg particular with his LV dysfunction.  Richard [name] is now taking Lasix 20 mg only on an as-needed basis.  Richard [name] continues to be on Zetia 10 mg and Lipitor 40 mg for hyperlipidemia.  Richard [name] is on baby aspirin.  Richard [name] continues with his HIV medical regimen for which she is followed at the [facility]..  I will see him in 6 months for cardiology reevaluation.   Time spent: 25 minutes  Troy Sine, MD, [facility] 03/24/2015 7:14 PM

## 2015-03-24 NOTE — Patient Instructions (Signed)
Your physician has recommended you make the following change in your medication:  The isosorbide has been decreased to 1/2 tablet daily.  Your physician wants you to follow-up in: 6 months or sooner if needed with Dr. Claiborne Billings. You will receive a reminder letter in the mail two months in advance. If you don't receive a letter, please call our office to schedule the follow-up appointment.

## 2015-04-01 ENCOUNTER — Encounter: Payer: Self-pay | Admitting: Cardiovascular Disease

## 2015-04-01 ENCOUNTER — Ambulatory Visit (INDEPENDENT_AMBULATORY_CARE_PROVIDER_SITE_OTHER): Payer: Medicare Other | Admitting: *Deleted

## 2015-04-01 DIAGNOSIS — I255 Ischemic cardiomyopathy: Secondary | ICD-10-CM | POA: Diagnosis not present

## 2015-04-01 NOTE — Progress Notes (Signed)
Remote ICD transmission.   

## 2015-04-05 LAB — CUP PACEART REMOTE DEVICE CHECK
Battery Remaining Percentage: 92 %
Date Time Interrogation Session: 20160823064631
HIGH POWER IMPEDANCE MEASURED VALUE: 61 Ohm
HighPow Impedance: 61 Ohm
Lead Channel Impedance Value: 390 Ohm
Lead Channel Sensing Intrinsic Amplitude: 9.7 mV
Lead Channel Setting Pacing Amplitude: 2.5 V
Lead Channel Setting Pacing Pulse Width: 0.5 ms
Lead Channel Setting Sensing Sensitivity: 0.5 mV
MDC IDC MSMT BATTERY REMAINING LONGEVITY: 103 mo
MDC IDC MSMT BATTERY VOLTAGE: 3.17 V
MDC IDC MSMT LEADCHNL RV PACING THRESHOLD AMPLITUDE: 0.75 V
MDC IDC MSMT LEADCHNL RV PACING THRESHOLD PULSEWIDTH: 0.5 ms
MDC IDC STAT BRADY RV PERCENT PACED: 1 %
Pulse Gen Serial Number: 7229147
Zone Setting Detection Interval: 300 ms

## 2015-04-21 ENCOUNTER — Emergency Department (INDEPENDENT_AMBULATORY_CARE_PROVIDER_SITE_OTHER)
Admission: EM | Admit: 2015-04-21 | Discharge: 2015-04-21 | Disposition: A | Discharge status: Home / Self Care | Payer: Medicare Other | Source: Home / Self Care | Attending: Family Medicine | Admitting: Family Medicine

## 2015-04-21 ENCOUNTER — Encounter (HOSPITAL_COMMUNITY): Payer: Self-pay | Admitting: Emergency Medicine

## 2015-04-21 DIAGNOSIS — N400 Enlarged prostate without lower urinary tract symptoms: Secondary | ICD-10-CM | POA: Diagnosis not present

## 2015-04-21 LAB — POCT URINALYSIS DIP (DEVICE)
BILIRUBIN URINE: NEGATIVE
Glucose, UA: NEGATIVE mg/dL
HGB URINE DIPSTICK: NEGATIVE
KETONES UR: NEGATIVE mg/dL
LEUKOCYTES UA: NEGATIVE
Nitrite: NEGATIVE
PH: 5.5 (ref 5.0–8.0)
Protein, ur: NEGATIVE mg/dL
Specific Gravity, Urine: 1.02 (ref 1.005–1.030)
Urobilinogen, UA: 0.2 mg/dL (ref 0.0–1.0)

## 2015-04-21 MED ORDER — TAMSULOSIN HCL 0.4 MG PO CAPS: 0.4000 mg | ORAL_CAPSULE | Freq: Every day | ORAL | Status: DC

## 2015-04-21 NOTE — Discharge Instructions (Signed)
Your urine did not look like you have a bladder infection.  Start the Flomax (tamsulosin) once a day.  Benign Prostatic Hyperplasia An enlarged prostate (benign prostatic hyperplasia) is common in older men. You may experience the following:  Weak urine stream.  Dribbling.  Feeling like the bladder has not emptied completely.  Difficulty starting urination.  Getting up frequently at night to urinate.  Urinating more frequently during the day. HOME CARE INSTRUCTIONS  Monitor your prostatic hyperplasia for any changes. The following actions may help to alleviate any discomfort you are experiencing:  Give yourself time when you urinate.  Stay away from alcohol.  Avoid beverages containing caffeine, such as coffee, tea, and colas, because they can make the problem worse.  Avoid decongestants, antihistamines, and some prescription medicines that can make the problem worse.  Follow up with your health care provider for further treatment as recommended. SEEK MEDICAL CARE IF:  You are experiencing progressive difficulty voiding.  Your urine stream is progressively getting narrower.  You are awaking from sleep with the urge to void more frequently.  You are constantly feeling the need to void.  You experience loss of urine, especially in small amounts. SEEK IMMEDIATE MEDICAL CARE IF:   You develop increased pain with urination or are unable to urinate.  You develop severe abdominal pain, vomiting, a high fever, or fainting.  You develop back pain or blood in your urine. MAKE SURE YOU:   Understand these instructions.  Will watch your condition.  Will get help right away if you are not doing well or get worse. Document Released: 07/26/2005 Document Revised: 03/28/2013 Document Reviewed: 12/26/2012 [facility] Patient Information 2015 [place], [place]. This information is not intended to replace advice given to you by your health care provider. Make sure you discuss any  questions you have with your health care provider.

## 2015-04-21 NOTE — ED Notes (Signed)
Pt was seen at the [place] two weeks ago for urinary urgency and frequency.  He states the checked his kidneys and they were fine.  He is still suffering from the frequency and urgency.

## 2015-04-21 NOTE — ED Provider Notes (Signed)
CSN: [id number]     Arrival date & time 04/21/15  1303 History   First MD Initiated Contact with Patient 04/21/15 1325     Chief Complaint  Patient presents with  . Urinary Frequency  . Urinary Urgency   (Consider location/radiation/quality/duration/timing/severity/associated sxs/prior Treatment) HPI Patient is a 59 y.o. male presenting with complaints of frequent and urgent urination. States symptoms started roughly 2 weeks ago, went to the [place] and had his urine checked and told nothing was wrong. Symptoms have been mostly persistent, maybe went away for a few days but 2 days ago came back. He complains of voiding 3-4 times an hour, 3-4 times during the night. He denies any burning with urination, penile discharge. No changes in urine color or smell, no blood. He complains of some right flank pain. He has never been diagnosed with BPH (prostate last checked 2 years ago). He endorses some difficulty with starting his stream/dribbling but flow is fine once going. Denies fevers/chills, nausea/vomiting, diarrhea/constipation, CP, SOB, leg swelling.  Past Medical History  Diagnosis Date  . HIV (human immunodeficiency virus infection)   . Hypertension   . Coronary artery disease     a. cath 04/02/2014 3v dx 80-90% in D1, 80-90% mid LAD, 95% apical LAD, 95% distal inferior LV branch of LCx, total occlusion of prox RCA  b). Cath 04/03/2014 DES to mid LAD, DES x3 to prox D1, DAPT indefinitely. LCx lesion untreated  . Stroke   . Ischemic cardiomyopathy 2015    EF 20-25 percent by echo in 04/2014, s/p [address]. Jude [place] model [id number] serial number [id number]   Past Surgical History  Procedure Laterality Date  . Cardiac catheterization  04/02/2014    DR KELLY  . Left and right heart catheterization with coronary angiogram N/A 04/02/2014    Procedure: LEFT AND RIGHT HEART CATHETERIZATION WITH CORONARY ANGIOGRAM;  Surgeon: Troy Sine, MD;  Location: [facility] CATH LAB;  Service: Cardiovascular;   Laterality: N/A;  . Percutaneous coronary stent intervention (pci-s) N/A 04/03/2014    Procedure: PERCUTANEOUS CORONARY STENT INTERVENTION (PCI-S);  Surgeon: Peter M Martinique, MD;  Location: [facility] CATH LAB;  Service: Cardiovascular;  Laterality: N/A;  . Implantable cardioverter defibrillator implant N/A 09/10/2014    Procedure: IMPLANTABLE CARDIOVERTER DEFIBRILLATOR IMPLANT;  Surgeon: Sanda Klein, MD;  [facility] model [id number] serial number [id number]   Family History  Problem Relation Age of Onset  . Heart disease Mother    Social History  Substance Use Topics  . Smoking status: Never Smoker   . Smokeless tobacco: Never Used  . Alcohol Use: No    Review of Systems Per HPI  Allergies  Ivp dye  Home Medications   Prior to Admission medications   Medication Sig Start Date End Date Taking? Authorizing Provider  isosorbide mononitrate (IMDUR) 30 MG 24 hr tablet Take 0.5 tablets (15 mg total) by mouth daily. 03/24/15  Yes Troy Sine, MD  lisinopril (PRINIVIL,ZESTRIL) 2.5 MG tablet Take 1 tablet (2.5 mg total) by mouth at bedtime. 01/09/15  Yes Troy Sine, MD  loratadine (CLARITIN) 10 MG tablet Take 10 mg by mouth daily as needed for allergies.   Yes Historical Provider, MD  metoprolol (LOPRESSOR) 50 MG tablet Take 1 tablet in the morning and 1/2 tablet at night 06/25/14  Yes Troy Sine, MD  pantoprazole (PROTONIX) 40 MG tablet Take 1 tablet (40 mg total) by mouth daily as needed. 04/04/14  Yes Almyra Deforest, PA  aspirin 81 MG chewable  tablet Chew 81 mg by mouth daily.    Historical Provider, MD  atorvastatin (LIPITOR) 40 MG tablet TAKE ONE TABLET BY MOUTH ONCE DAILY 11/26/14   Troy Sine, MD  clopidogrel (PLAVIX) 75 MG tablet Take 1 tablet (75 mg total) by mouth daily with breakfast. 04/04/14   Almyra Deforest, PA  clotrimazole (LOTRIMIN) 1 % cream Apply 1 application topically as needed (For itching of foot).    Historical Provider, MD  diphenhydrAMINE (BENADRYL) 25 MG tablet  Take 25 mg by mouth 2 (two) times daily.    Historical Provider, MD  emtricitabine-tenofovir (TRUVADA) 200-300 MG per tablet Take 1 tablet by mouth daily.    Historical Provider, MD  ezetimibe (ZETIA) 10 MG tablet Take 5 mg by mouth daily.     Historical Provider, MD  furosemide (LASIX) 20 MG tablet Take 1 tablet (20 mg total) by mouth daily as needed. 06/25/14   Troy Sine, MD  ipratropium (ATROVENT) 0.06 % nasal spray Place 2 sprays into both nostrils 4 (four) times daily as needed for rhinitis. 10/05/13   Audelia Hives Presson, PA  lopinavir-ritonavir (KALETRA) 200-50 MG per tablet Take 2 tablets by mouth 2 (two) times daily.    Historical Provider, MD  raltegravir (ISENTRESS) 400 MG tablet Take 400 mg by mouth 2 (two) times daily.    Historical Provider, MD  risperiDONE (RISPERDAL) 2 MG tablet Take 2 mg by mouth daily.     Historical Provider, MD  traZODone (DESYREL) 100 MG tablet Take 100 mg by mouth at bedtime as needed.     Historical Provider, MD   Meds Ordered and Administered this Visit  Medications - No data to display  BP 130/91 mmHg  Pulse 75  Temp(Src) 97.8 F (36.6 C) (Oral)  Resp 16  SpO2 100% No data found.   Physical Exam Vitals reviewed Gen: NAD, pleasant AA gentleman CV: RRR, normal s1s2, no murmurs. 2+ radial pulses bilaterally Resp: CTAB, normal effort Abdomen: soft, nontender, nondistended, normal bowel sounds. No suprapubic tenderness Back: no CVA or spinal tenderness GU: normal rectal tone. Prostate mildly enlarged, nontender. No blood.  ED Course  Procedures (including critical care time)  Labs Review Labs Reviewed  POCT URINALYSIS DIP (DEVICE)  no blood, no protein, no leuks or nitrites  Imaging Review No results found.    MDM   1. BPH (benign prostatic hyperplasia)     Most consistent with BPH. No evidence of UTI on UA, no systemic signs of pyelonephritis. Trial of tamsulosin and f/u with [facility] ([facility]) as needed for further  management.    Leone Brand, MD 04/21/15 1415

## 2015-04-23 ENCOUNTER — Telehealth: Payer: Self-pay | Admitting: *Deleted

## 2015-04-23 NOTE — Telephone Encounter (Signed)
LMTCB/sss  Patient needs device clinic appt per [facility] to change morphology updates from auto to manual.

## 2015-04-28 ENCOUNTER — Ambulatory Visit (INDEPENDENT_AMBULATORY_CARE_PROVIDER_SITE_OTHER): Payer: Medicare Other | Admitting: *Deleted

## 2015-04-28 DIAGNOSIS — I4729 Other ventricular tachycardia: Secondary | ICD-10-CM

## 2015-04-28 DIAGNOSIS — I472 Ventricular tachycardia: Secondary | ICD-10-CM

## 2015-04-28 DIAGNOSIS — I255 Ischemic cardiomyopathy: Secondary | ICD-10-CM

## 2015-04-28 LAB — CUP PACEART INCLINIC DEVICE CHECK
Brady Statistic RV Percent Paced: 0.94 %
HIGH POWER IMPEDANCE MEASURED VALUE: 63 Ohm
Lead Channel Impedance Value: 387.5 Ohm
Lead Channel Setting Sensing Sensitivity: 0.5 mV
MDC IDC MSMT BATTERY REMAINING LONGEVITY: 104.4 mo
MDC IDC MSMT LEADCHNL RV SENSING INTR AMPL: 11.6 mV
MDC IDC SESS DTM: 20160919131313
MDC IDC SET LEADCHNL RV PACING AMPLITUDE: 2.5 V
MDC IDC SET LEADCHNL RV PACING PULSEWIDTH: 0.5 ms
Pulse Gen Serial Number: 7229147
Zone Setting Detection Interval: 300 ms

## 2015-04-28 NOTE — Progress Notes (Signed)
ICD check in clinic to change morphology template from auto to manual (N/C). Normal device function. Sensing consistent with previous device measurements. Impedance trends stable over time. No evidence of any ventricular arrhythmias. Stable thoracic impedance. Histogram distribution appropriate for patient and level of activity. Spoke to tech svcs about the automatic morphology template updates in the pt's single zone device where this is not programmed on. According to tech svcs because the device is normally programmed with at least 2 zones the feature was still on in the VT zone although the VT zone was not enabled. This feature has been d/c'd. No other permanent programming changes made. Device programmed at appropriate safety margins. Device programmed to optimize intrinsic conduction. Estimated longevity 8.4-8.7 years. Pt enrolled in remote follow-up. Plan to follow up as scheduled.

## 2015-05-26 ENCOUNTER — Telehealth: Payer: Self-pay | Admitting: Cardiovascular Disease

## 2015-05-26 NOTE — Telephone Encounter (Signed)
Spoke to patient. [facility] at[facility] 40mg  - stated concern for BP elevation. They dispensed him pravastatin 80mg  w/ instruction to cut pill in half.  Informed him this should be fine, will route to Dr. Claiborne Billings so he is aware of change.

## 2015-05-26 NOTE — Telephone Encounter (Signed)
Please call,concerning his Atorvastatin.

## 2015-05-26 NOTE — Telephone Encounter (Signed)
If he was on lipitor 40 mg , would have him take 80 pravastatin, not 40 mg. Technically lipitor 20 mg is equivalent to 40 mg zocor and 80 mg pravachol.

## 2015-05-26 NOTE — Telephone Encounter (Signed)
Advised pt to take whole tablet of pravastatin, call if problems or if refill needed.  Pt voiced understanding.

## 2015-05-27 ENCOUNTER — Encounter: Payer: Self-pay | Admitting: Cardiovascular Disease

## 2015-06-09 ENCOUNTER — Telehealth: Payer: Self-pay | Admitting: Cardiovascular Disease

## 2015-06-09 MED ORDER — PRAVASTATIN SODIUM 80 MG PO TABS: 80.0000 mg | ORAL_TABLET | Freq: Every evening | ORAL | Status: DC

## 2015-06-09 NOTE — Telephone Encounter (Signed)
Rx sent to local pharmacy, pt aware.

## 2015-06-09 NOTE — Telephone Encounter (Signed)
Calling because he is taken 80 mg a day and not 40mg  and he is going to be out of his medication quicker because he is not cutting it in half . Was told to call back to let them know(Dr.Kelly) so that he can get some more medication . Please call  Thanks

## 2015-06-09 NOTE — Telephone Encounter (Signed)
Richard [name] is calling because he is on

## 2015-07-01 ENCOUNTER — Ambulatory Visit (INDEPENDENT_AMBULATORY_CARE_PROVIDER_SITE_OTHER): Payer: Medicare Other | Admitting: *Deleted

## 2015-07-01 DIAGNOSIS — I255 Ischemic cardiomyopathy: Secondary | ICD-10-CM | POA: Diagnosis not present

## 2015-07-01 NOTE — Progress Notes (Signed)
Remote ICD transmission.   

## 2015-07-14 ENCOUNTER — Encounter: Payer: Self-pay | Admitting: Cardiology

## 2015-07-14 LAB — CUP PACEART REMOTE DEVICE CHECK
Battery Remaining Longevity: 98 mo
Battery Remaining Percentage: 90 %
Date Time Interrogation Session: 20161205122333
HIGH POWER IMPEDANCE MEASURED VALUE: 68 Ohm
Implantable Lead Implant Date: 20160202
Implantable Lead Location: 753860
Lead Channel Setting Sensing Sensitivity: 0.5 mV
MDC IDC MSMT LEADCHNL RA IMPEDANCE VALUE: 390 Ohm
MDC IDC MSMT LEADCHNL RV SENSING INTR AMPL: 10.7 mV
MDC IDC PG SERIAL: 7229147
MDC IDC SET LEADCHNL RV PACING AMPLITUDE: 2.5 V
MDC IDC SET LEADCHNL RV PACING PULSEWIDTH: 0.5 ms
MDC IDC STAT BRADY RV PERCENT PACED: 1 % — AB

## 2015-07-15 ENCOUNTER — Encounter: Payer: Self-pay | Admitting: Cardiology

## 2015-07-16 ENCOUNTER — Telehealth: Payer: Self-pay | Admitting: Cardiovascular Disease

## 2015-07-16 NOTE — Telephone Encounter (Signed)
Please call,wants to know if he is taking his Metoprolol right?

## 2015-07-16 NOTE — Telephone Encounter (Signed)
Returned call to patient.Advised he should be taking metoprolol 50 mg am,25 mg pm.Follow up appointment scheduled with Dr.Kelly 09/16/15 at 8:45 am.

## 2015-08-01 ENCOUNTER — Emergency Department (INDEPENDENT_AMBULATORY_CARE_PROVIDER_SITE_OTHER): Payer: Medicare Other

## 2015-08-01 ENCOUNTER — Emergency Department (INDEPENDENT_AMBULATORY_CARE_PROVIDER_SITE_OTHER)
Admission: EM | Admit: 2015-08-01 | Discharge: 2015-08-01 | Disposition: A | Discharge status: Home / Self Care | Payer: Medicare Other | Source: Home / Self Care | Attending: Family Medicine | Admitting: Family Medicine

## 2015-08-01 ENCOUNTER — Encounter (HOSPITAL_COMMUNITY): Payer: Self-pay | Admitting: Emergency Medicine

## 2015-08-01 DIAGNOSIS — S63101A Unspecified subluxation of right thumb, initial encounter: Secondary | ICD-10-CM

## 2015-08-01 NOTE — ED Provider Notes (Signed)
CSN: [id number]     Arrival date & time 08/01/15  1304 History   First MD Initiated Contact with Patient 08/01/15 1320     Chief Complaint  Patient presents with  . Finger Injury   (Consider location/radiation/quality/duration/timing/severity/associated sxs/prior Treatment) Patient is a 59 y.o. male presenting with hand injury. The history is provided by the patient.  Hand Injury Location:  Finger Time since incident:  1 month Injury: yes   Mechanism of injury comment:  Using a wrench and thumb was hyperextended and popped, continues sore at mp joint Finger location:  R thumb   Past Medical History  Diagnosis Date  . HIV (human immunodeficiency virus infection) ([place])   . Hypertension   . Coronary artery disease     a. cath 04/02/2014 3v dx 80-90% in D1, 80-90% mid LAD, 95% apical LAD, 95% distal inferior LV branch of LCx, total occlusion of prox RCA  b). Cath 04/03/2014 DES to mid LAD, DES x3 to prox D1, DAPT indefinitely. LCx lesion untreated  . Stroke ([place])   . Ischemic cardiomyopathy 2015    EF 20-25 percent by echo in 04/2014, s/p [address] Jude [place] model [id number] serial number [id number]   Past Surgical History  Procedure Laterality Date  . Cardiac catheterization  04/02/2014    DR KELLY  . Left and right heart catheterization with coronary angiogram N/A 04/02/2014    Procedure: LEFT AND RIGHT HEART CATHETERIZATION WITH CORONARY ANGIOGRAM;  Surgeon: Troy Sine, MD;  Location: [facility] CATH LAB;  Service: Cardiovascular;  Laterality: N/A;  . Percutaneous coronary stent intervention (pci-s) N/A 04/03/2014    Procedure: PERCUTANEOUS CORONARY STENT INTERVENTION (PCI-S);  Surgeon: Peter M Martinique, MD;  Location: [facility] CATH LAB;  Service: Cardiovascular;  Laterality: N/A;  . Implantable cardioverter defibrillator implant N/A 09/10/2014    Procedure: IMPLANTABLE CARDIOVERTER DEFIBRILLATOR IMPLANT;  Surgeon: Sanda Klein, MD;  [facility] model [id number] serial number [phone] [id number]     Family History  Problem Relation Age of Onset  . Heart disease Mother    Social History  Substance Use Topics  . Smoking status: Never Smoker   . Smokeless tobacco: Never Used  . Alcohol Use: No    Review of Systems  Constitutional: Negative.   Musculoskeletal: Positive for joint swelling.  Skin: Negative.   All other systems reviewed and are negative.   Allergies  Ivp dye  Home Medications   Prior to Admission medications   Medication Sig Start Date End Date Taking? Authorizing Provider  aspirin 81 MG chewable tablet Chew 81 mg by mouth daily.    Historical Provider, MD  clopidogrel (PLAVIX) 75 MG tablet Take 1 tablet (75 mg total) by mouth daily with breakfast. 04/04/14   Almyra Deforest, PA  clotrimazole (LOTRIMIN) 1 % cream Apply 1 application topically as needed (For itching of foot).    Historical Provider, MD  diphenhydrAMINE (BENADRYL) 25 MG tablet Take 25 mg by mouth 2 (two) times daily.    Historical Provider, MD  emtricitabine-tenofovir (TRUVADA) 200-300 MG per tablet Take 1 tablet by mouth daily.    Historical Provider, MD  ezetimibe (ZETIA) 10 MG tablet Take 5 mg by mouth daily.     Historical Provider, MD  furosemide (LASIX) 20 MG tablet Take 1 tablet (20 mg total) by mouth daily as needed. 06/25/14   Troy Sine, MD  ipratropium (ATROVENT) 0.06 % nasal spray Place 2 sprays into both nostrils 4 (four) times daily as needed for rhinitis. 10/05/13  Audelia Hives Presson, PA  isosorbide mononitrate (IMDUR) 30 MG 24 hr tablet Take 0.5 tablets (15 mg total) by mouth daily. 03/24/15   Troy Sine, MD  lisinopril (PRINIVIL,ZESTRIL) 2.5 MG tablet Take 1 tablet (2.5 mg total) by mouth at bedtime. 01/09/15   Troy Sine, MD  lopinavir-ritonavir (KALETRA) 200-50 MG per tablet Take 2 tablets by mouth 2 (two) times daily.    Historical Provider, MD  loratadine (CLARITIN) 10 MG tablet Take 10 mg by mouth daily as needed for allergies.    Historical Provider, MD  metoprolol  (LOPRESSOR) 50 MG tablet Take 1 tablet in the morning and 1/2 tablet at night 06/25/14   Troy Sine, MD  pantoprazole (PROTONIX) 40 MG tablet Take 1 tablet (40 mg total) by mouth daily as needed. 04/04/14   Almyra Deforest, PA  pravastatin (PRAVACHOL) 80 MG tablet Take 1 tablet (80 mg total) by mouth every evening. 06/09/15   Troy Sine, MD  raltegravir (ISENTRESS) 400 MG tablet Take 400 mg by mouth 2 (two) times daily.    Historical Provider, MD  risperiDONE (RISPERDAL) 2 MG tablet Take 2 mg by mouth daily.     Historical Provider, MD  tamsulosin (FLOMAX) 0.4 MG CAPS capsule Take 1 capsule (0.4 mg total) by mouth daily. 04/21/15   Leone Brand, MD  traZODone (DESYREL) 100 MG tablet Take 100 mg by mouth at bedtime as needed.     Historical Provider, MD   Meds Ordered and Administered this Visit  Medications - No data to display  There were no vitals taken for this visit. No data found.   Physical Exam  Constitutional: He is oriented to person, place, and time. He appears well-developed and well-nourished.  Musculoskeletal: He exhibits tenderness.       Hands: Neurological: He is alert and oriented to person, place, and time.  Skin: Skin is warm and dry.  Nursing note and vitals reviewed.   ED Course  Procedures (including critical care time)  Labs Review Labs Reviewed - No data to display  Imaging Review Dg Finger Thumb Right  08/01/2015  CLINICAL DATA:  59 year old who had a spontaneous dislocation of the right thumb when turning a bolt earlier today while working. Initial encounter. EXAM: RIGHT THUMB 2+V COMPARISON:  None. FINDINGS: Medial subluxation of the 1st MTP joint. No associated fractures. Well preserved joint spaces. Well preserved bone mineral density. Sesamoid bone at the IP joint of the thumb mimics an old fracture. IMPRESSION: Medial subluxation of the 1st MTP joint.  No associated fractures. Electronically Signed   By: Evangeline Dakin M.D.   On: 08/01/2015 13:41    X-rays reviewed and report per radiologist.   Visual Acuity Review  Right Eye Distance:   Left Eye Distance:   Bilateral Distance:    Right Eye Near:   Left Eye Near:    Bilateral Near:         MDM   1. Subluxation of thumb, right, initial encounter        Billy Fischer, MD 08/01/15 1409

## 2015-08-01 NOTE — ED Notes (Signed)
Reports that while turning a bolt one month ago, grip slipped and thumb pulled backwards toward wrist.  Reports two pops.  Patient here today for pain in joint, very tender to touch

## 2015-08-01 NOTE — Discharge Instructions (Signed)
Wear splint at all times until seen by orthopedist next week, may remove to wash hand as needed.

## 2015-08-12 DIAGNOSIS — S63418A Traumatic rupture of collateral ligament of other finger at metacarpophalangeal and interphalangeal joint, initial encounter: Secondary | ICD-10-CM | POA: Insufficient documentation

## 2015-08-20 ENCOUNTER — Ambulatory Visit (INDEPENDENT_AMBULATORY_CARE_PROVIDER_SITE_OTHER): Payer: Medicare Other | Admitting: Physician Assistant

## 2015-08-20 ENCOUNTER — Encounter: Payer: Self-pay | Admitting: Physician Assistant

## 2015-08-20 VITALS — BP 94/70 | HR 58 | Ht 69.5 in | Wt 197.1 lb

## 2015-08-20 DIAGNOSIS — Z7189 Other specified counseling: Secondary | ICD-10-CM

## 2015-08-20 DIAGNOSIS — Z79899 Other long term (current) drug therapy: Secondary | ICD-10-CM

## 2015-08-20 DIAGNOSIS — E785 Hyperlipidemia, unspecified: Secondary | ICD-10-CM

## 2015-08-20 NOTE — Patient Instructions (Signed)
You have been cleared for surgery! We will fax a letter to Dr Bertis Ruddy office.  Please keep your previously scheduled appointment with Dr Claiborne Billings.  Your physician recommends that you return for lab work prior to seeing Dr Claiborne Billings!

## 2015-08-20 NOTE — Progress Notes (Signed)
Cardiology Office Note   Date:  08/20/2015   ID:  Richard [name], DOB 02-18-1956, MRN [id number]  PCP:  Default, Provider, MD  Cardiologist:  Dr Claiborne Billings, Dr Juanetta Snow, PA-C   Chief Complaint  Patient presents with  . Medical Clearance    no chest pain, no shortness of breath, no edema, no pain in legs, no cramping in legs, no lightheadedness or dizziness    History of Present Illness: Richard [name] is a 60 y.o. male with a history of CVA, HIV, HTN, HL, depression, DES LAD & DES x 3 D1 2015, ICM w/ EF 20-25% by echo 04/2014.  Richard [name] presents for evaluation prior to having surgery on his right thumb.  Richard [name] had a thumb injury and needs surgery for it. Because of his cardiac issues, the surgeon requested we determine if the surgery should be at the outpatient surgery center or at the hospital.  Richard [name] is doing very well. He remains active without difficulty. He takes his Lasix on a when necessary basis and rarely needs it. He is compliant with his medications. He is not having any bleeding issues or problems on aspirin and Plavix. His HIV medications were changed recently and because of that he was switched from Pravachol to atorvastatin.  Occasionally, he will have orthostatic dizziness in the mornings, but that resolves. He states his blood pressure is generally in the low 100s at home. He is asymptomatic with this. Today in the office, his systolic blood pressures 94 but he feels fine. Not had any recent illnesses, fevers or chills. He does not feel he has any activity limitations at all.  He never gets palpitations or feels like his heart is skipping or racing. His defibrillator has not fired and he is compliant with remote checks.  Past Medical History  Diagnosis Date  . HIV (human immunodeficiency virus infection) ([place])   . Hypertension   . Coronary artery disease     a. cath 04/02/2014 3v dx 80-90% in D1, 80-90% mid LAD,  95% apical LAD, 95% distal inferior LV branch of LCx, total occlusion of prox RCA  b). Cath 04/03/2014 DES to mid LAD, DES x3 to prox D1, DAPT indefinitely. LCx lesion untreated  . Stroke ([place])   . Ischemic cardiomyopathy 2015    EF 20-25 percent by echo in 04/2014, s/p [address]. Jude [place]itunas model [id number] serial number [id number]    Past Surgical History  Procedure Laterality Date  . Cardiac catheterization  04/02/2014    DR KELLY  . Left and right heart catheterization with coronary angiogram N/A 04/02/2014    Procedure: LEFT AND RIGHT HEART CATHETERIZATION WITH CORONARY ANGIOGRAM;  Surgeon: Troy Sine, MD;  Location: [facility] CATH LAB;  Service: Cardiovascular;  Laterality: N/A;  . Percutaneous coronary stent intervention (pci-s) N/A 04/03/2014    Procedure: PERCUTANEOUS CORONARY STENT INTERVENTION (PCI-S);  Surgeon: Peter M Martinique, MD;  Location: [facility] CATH LAB;  Service: Cardiovascular;  Laterality: N/A;  . Implantable cardioverter defibrillator implant N/A 09/10/2014    Procedure: IMPLANTABLE CARDIOVERTER DEFIBRILLATOR IMPLANT;  Surgeon: Sanda Klein, MD;  [place] [phone] serial number [id number]    Current Outpatient Prescriptions  Medication Sig Dispense Refill  . aspirin 81 MG chewable tablet Chew 81 mg by mouth daily.    . clopidogrel (PLAVIX) 75 MG tablet Take 1 tablet (75 mg total) by mouth daily with breakfast. 30 tablet 0  . clotrimazole (LOTRIMIN) 1 % cream  Apply 1 application topically as needed (For itching of foot).    . Darunavir Ethanolate 75 MG TABS Take 1 tablet by mouth daily.    . diphenhydrAMINE (BENADRYL) 25 MG tablet Take 25 mg by mouth 2 (two) times daily.    Nathaneil Canary Sodium 25 MG TABS Take 50 mg by mouth daily.    [name] ezetimibe (ZETIA) 10 MG tablet Take 5 mg by mouth daily.     . furosemide (LASIX) 20 MG tablet Take 1 tablet (20 mg total) by mouth daily as needed. 30 tablet 3  . ipratropium (ATROVENT) 0.06 % nasal spray Place 2 sprays into  both nostrils 4 (four) times daily as needed for rhinitis.    [name] isosorbide mononitrate (IMDUR) 30 MG 24 hr tablet Take 0.5 tablets (15 mg total) by mouth daily. 30 tablet 6  . lisinopril (PRINIVIL,ZESTRIL) 2.5 MG tablet Take 1 tablet (2.5 mg total) by mouth at bedtime. 30 tablet 6  . loratadine (CLARITIN) 10 MG tablet Take 10 mg by mouth daily as needed for allergies.    . metoprolol (LOPRESSOR) 50 MG tablet Take 1 tablet in the morning and 1/2 tablet at night 180 tablet 3  . pantoprazole (PROTONIX) 40 MG tablet Take 1 tablet (40 mg total) by mouth daily as needed. 30 tablet 0  . risperiDONE (RISPERDAL) 2 MG tablet Take 2 mg by mouth daily.     . ritonavir (NORVIR) 100 MG TABS tablet Take 1 mg by mouth daily with breakfast.    . tamsulosin (FLOMAX) 0.4 MG CAPS capsule Take 1 capsule (0.4 mg total) by mouth daily. 30 capsule 0  . traZODone (DESYREL) 100 MG tablet Take 100 mg by mouth at bedtime as needed.      No current facility-administered medications for this visit.    Allergies:   Ioxaglate and Ivp dye    Social History:  The patient  reports that he has never smoked. He has never used smokeless tobacco. He reports that he does not drink alcohol or use illicit drugs.   Family History:  The patient's family history includes Heart disease in his mother.    ROS:  Please see the history of present illness. All other systems are reviewed and negative.    PHYSICAL EXAM: VS:  BP 94/70 mmHg  Pulse 58  Ht 5' 9.5" (1.765 m)  Wt 197 lb 2 oz (89.415 kg)  BMI 28.70 kg/m2 , BMI Body mass index is 28.7 kg/(m^2). GEN: Well nourished, well developed, male in no acute distress HEENT: normal for age  Neck: no JVD, no carotid bruit, no masses Cardiac: RRR; no murmur, no rubs, or gallops Respiratory:  clear to auscultation bilaterally, normal work of breathing GI: soft, nontender, nondistended, + BS MS: no deformity or atrophy; no edema; distal pulses are 2+ in all 4 extremities; right thumb is  splinted and not disturbed Skin: warm and dry, no rash Neuro:  Strength and sensation are intact Psych: euthymic mood, full affect   EKG:  EKG is ordered today. The ekg ordered today demonstrates sinus bradycardia, rate 58 with no acute ischemic changes No change from an EKG dated 09/11/2014   Recent Labs: 09/04/2014: Hemoglobin 13.7; Platelets 141* 09/25/2014: ALT 23; BUN 16; Creat 1.14; Potassium 4.2; Sodium 139; TSH 0.917    Lipid Panel    Component Value Date/Time   CHOL 151 09/25/2014 1141   TRIG 122 09/25/2014 1141   HDL 31* 09/25/2014 1141   CHOLHDL 4.9 09/25/2014 1141   VLDL  24 09/25/2014 1141   LDLCALC 96 09/25/2014 1141     Wt Readings from Last 3 Encounters:  08/20/15 197 lb 2 oz (89.415 kg)  03/24/15 190 lb 8 oz (86.41 kg)  01/09/15 190 lb 9.6 oz (86.456 kg)     Other studies Reviewed: Additional studies/ records that were reviewed today include: Office Notes ECGs and other records.  ASSESSMENT AND PLAN:  1.  Preoperative evaluation: Mr. Grauberger is not having any ischemic symptoms and no signs or symptoms of volume overload. He is at acceptable risk for the coming surgery without further cardiac testing needed. However, because of his low EF and history of CAD, it would be better to perform the surgery at the hospital.  2. History of CAD: He is not having any ischemic symptoms. He is on aspirin, Plavix, Zetia, metoprolol, and atorvastatin. His blood pressure is borderline but he is on the lowest possible dose of lisinopril and because of his risk of ventricular ectopy, do not wish to decrease his beta blocker.  3. Hyperlipidemia: He is to continue atorvastatin and get liver function testing plus a lipid profile when he sees Dr. Claiborne Billings.  4. Ischemic cardiomyopathy: His weight is up today but he is not having any symptoms of volume overload and there is no volume overload by exam. He is to continue taking the Lasix on a when necessary basis for weight  gain.   Current medicines are reviewed at length with the patient today.  The patient does not have concerns regarding medicines.  The following changes have been made:  no change  Labs/ tests ordered today include:   Orders Placed This Encounter  Procedures  . Lipid panel  . Comprehensive metabolic panel  . EKG 12-Lead     Disposition:   FU with Dr. Claiborne Billings as scheduled  Signed, Lenoard Aden  08/20/2015 10:01 AM    [facility] [place], [place], [place]  [address] Phone: [phone]; Fax: [phone]

## 2015-08-21 ENCOUNTER — Other Ambulatory Visit: Payer: Self-pay | Admitting: Orthopedic Surgery

## 2015-08-22 NOTE — Progress Notes (Signed)
Pt denies SOB and chest pain but is under the care of Dr. Claiborne Billings and Dr. Sallyanne Kuster, cardiology. Pt stated that he was instructed to stop taking Plavix but not Aspirin; pt stated  last dose of Plavix was Wednesday, 08/20/15; Dr. Conrad [place] made aware that pt stopped Plavix but but Aspirin and reviewed cardiac clearance note; no new orders given. Pt made aware to stop NSAID's , otc vitamins, herbal medications and fish oil. Pt verbalized understanding of all pre-op instructions.

## 2015-08-25 ENCOUNTER — Ambulatory Visit (HOSPITAL_COMMUNITY): Payer: Medicare Other | Admitting: Anesthesiology

## 2015-08-25 ENCOUNTER — Encounter (HOSPITAL_COMMUNITY)
Admission: RE | Disposition: A | Discharge status: Home / Self Care | Payer: Self-pay | Source: Ambulatory Visit | Attending: Orthopedic Surgery

## 2015-08-25 ENCOUNTER — Encounter (HOSPITAL_COMMUNITY): Payer: Self-pay | Admitting: Surgery

## 2015-08-25 ENCOUNTER — Ambulatory Visit (HOSPITAL_COMMUNITY)
Admission: RE | Admit: 2015-08-25 | Discharge: 2015-08-25 | Disposition: A | Discharge status: Home / Self Care | Payer: Medicare Other | Source: Ambulatory Visit | Attending: Orthopedic Surgery | Admitting: Orthopedic Surgery

## 2015-08-25 DIAGNOSIS — S5321XA Traumatic rupture of right radial collateral ligament, initial encounter: Secondary | ICD-10-CM | POA: Insufficient documentation

## 2015-08-25 DIAGNOSIS — I2510 Atherosclerotic heart disease of native coronary artery without angina pectoris: Secondary | ICD-10-CM | POA: Diagnosis not present

## 2015-08-25 DIAGNOSIS — I255 Ischemic cardiomyopathy: Secondary | ICD-10-CM | POA: Insufficient documentation

## 2015-08-25 DIAGNOSIS — Z21 Asymptomatic human immunodeficiency virus [HIV] infection status: Secondary | ICD-10-CM | POA: Insufficient documentation

## 2015-08-25 DIAGNOSIS — Z8673 Personal history of transient ischemic attack (TIA), and cerebral infarction without residual deficits: Secondary | ICD-10-CM | POA: Diagnosis not present

## 2015-08-25 DIAGNOSIS — I10 Essential (primary) hypertension: Secondary | ICD-10-CM | POA: Diagnosis not present

## 2015-08-25 DIAGNOSIS — S63418A Traumatic rupture of collateral ligament of other finger at metacarpophalangeal and interphalangeal joint, initial encounter: Secondary | ICD-10-CM | POA: Diagnosis not present

## 2015-08-25 DIAGNOSIS — X58XXXA Exposure to other specified factors, initial encounter: Secondary | ICD-10-CM | POA: Diagnosis not present

## 2015-08-25 HISTORY — PX: MEDIAL COLLATERAL LIGAMENT REPAIR, KNEE: SHX2019

## 2015-08-25 LAB — CBC
HCT: 42.8 % (ref 39.0–52.0)
Hemoglobin: 14.9 g/dL (ref 13.0–17.0)
MCH: 27.7 pg (ref 26.0–34.0)
MCHC: 34.8 g/dL (ref 30.0–36.0)
MCV: 79.7 fL (ref 78.0–100.0)
PLATELETS: 108 10*3/uL — AB (ref 150–400)
RBC: 5.37 MIL/uL (ref 4.22–5.81)
RDW: 14.8 % (ref 11.5–15.5)
WBC: 4.5 10*3/uL (ref 4.0–10.5)

## 2015-08-25 LAB — BASIC METABOLIC PANEL
ANION GAP: 10 (ref 5–15)
BUN: 13 mg/dL (ref 6–20)
CALCIUM: 9.1 mg/dL (ref 8.9–10.3)
CO2: 21 mmol/L — ABNORMAL LOW (ref 22–32)
Chloride: 110 mmol/L (ref 101–111)
Creatinine, Ser: 1.46 mg/dL — ABNORMAL HIGH (ref 0.61–1.24)
GFR, EST AFRICAN AMERICAN: 59 mL/min — AB (ref >60)
GFR, EST NON AFRICAN AMERICAN: 51 mL/min — AB (ref >60)
GLUCOSE: 85 mg/dL (ref 65–99)
Potassium: 4.4 mmol/L (ref 3.5–5.1)
SODIUM: 141 mmol/L (ref 135–145)

## 2015-08-25 SURGERY — RECONSTRUCTION, LIGAMENT, MEDIAL COLLATERAL, KNEE
Anesthesia: Monitor Anesthesia Care | Site: Thumb | Laterality: Right

## 2015-08-25 MED ORDER — MIDAZOLAM HCL 2 MG/2ML IJ SOLN
INTRAMUSCULAR | Status: AC
  Filled 2015-08-25: qty 2

## 2015-08-25 MED ORDER — FENTANYL CITRATE (PF) 250 MCG/5ML IJ SOLN
INTRAMUSCULAR | Status: AC
  Filled 2015-08-25: qty 5

## 2015-08-25 MED ORDER — FENTANYL CITRATE (PF) 100 MCG/2ML IJ SOLN
100.0000 ug | Freq: Once | INTRAMUSCULAR | Status: AC
  Administered 2015-08-25: 100 ug via INTRAVENOUS

## 2015-08-25 MED ORDER — MIDAZOLAM HCL 5 MG/5ML IJ SOLN
INTRAMUSCULAR | Status: DC | PRN
  Administered 2015-08-25: 2 mg via INTRAVENOUS

## 2015-08-25 MED ORDER — HYDROMORPHONE HCL 1 MG/ML IJ SOLN: 0.2500 mg | INTRAMUSCULAR | Status: DC | PRN

## 2015-08-25 MED ORDER — MIDAZOLAM HCL 2 MG/2ML IJ SOLN
2.0000 mg | Freq: Once | INTRAMUSCULAR | Status: AC
  Administered 2015-08-25: 2 mg via INTRAVENOUS

## 2015-08-25 MED ORDER — CHLORHEXIDINE GLUCONATE 4 % EX LIQD: 60.0000 mL | Freq: Once | CUTANEOUS | Status: DC

## 2015-08-25 MED ORDER — PROMETHAZINE HCL 25 MG/ML IJ SOLN: 6.2500 mg | INTRAMUSCULAR | Status: DC | PRN

## 2015-08-25 MED ORDER — MEPERIDINE HCL 25 MG/ML IJ SOLN: 6.2500 mg | INTRAMUSCULAR | Status: DC | PRN

## 2015-08-25 MED ORDER — LACTATED RINGERS IV SOLN
INTRAVENOUS | Status: DC
  Administered 2015-08-25: 12:00:00 via INTRAVENOUS

## 2015-08-25 MED ORDER — FENTANYL CITRATE (PF) 100 MCG/2ML IJ SOLN
INTRAMUSCULAR | Status: DC | PRN
  Administered 2015-08-25 (×2): 50 ug via INTRAVENOUS

## 2015-08-25 MED ORDER — PROPOFOL 500 MG/50ML IV EMUL
INTRAVENOUS | Status: DC | PRN
  Administered 2015-08-25: 50 ug/kg/min via INTRAVENOUS

## 2015-08-25 MED ORDER — CEFAZOLIN SODIUM-DEXTROSE 2-3 GM-% IV SOLR
2.0000 g | INTRAVENOUS | Status: AC
  Administered 2015-08-25: 2 g via INTRAVENOUS
  Filled 2015-08-25: qty 50

## 2015-08-25 MED ORDER — BUPIVACAINE HCL (PF) 0.5 % IJ SOLN
INTRAMUSCULAR | Status: DC | PRN
  Administered 2015-08-25: 30 mL

## 2015-08-25 MED ORDER — FENTANYL CITRATE (PF) 100 MCG/2ML IJ SOLN
INTRAMUSCULAR | Status: AC
  Filled 2015-08-25: qty 2

## 2015-08-25 MED ORDER — MEPIVACAINE HCL 1.5 % IJ SOLN
INTRAMUSCULAR | Status: DC | PRN
  Administered 2015-08-25: 10 mL via PERINEURAL

## 2015-08-25 MED ORDER — OXYCODONE-ACETAMINOPHEN 5-325 MG PO TABS: 1.0000 | ORAL_TABLET | ORAL | Status: DC | PRN

## 2015-08-25 SURGICAL SUPPLY — 55 items
ANCH SUT 2-0 3/8 CRC MIC FT 4 (Anchor) ×1 IMPLANT
ANCHOR FT CORKSCREW MICRO 2-0 (Anchor) ×3 IMPLANT
APL SKNCLS STERI-STRIP NONHPOA (GAUZE/BANDAGES/DRESSINGS) ×1
BANDAGE ACE 4X5 VEL STRL LF (GAUZE/BANDAGES/DRESSINGS) ×3 IMPLANT
BANDAGE ELASTIC 4 VELCRO ST LF (GAUZE/BANDAGES/DRESSINGS) IMPLANT
BENZOIN TINCTURE PRP APPL 2/3 (GAUZE/BANDAGES/DRESSINGS) ×3 IMPLANT
BNDG CMPR 9X4 STRL LF SNTH (GAUZE/BANDAGES/DRESSINGS) ×1
BNDG ESMARK 4X9 LF (GAUZE/BANDAGES/DRESSINGS) ×3 IMPLANT
BNDG GAUZE ELAST 4 BULKY (GAUZE/BANDAGES/DRESSINGS) IMPLANT
CLOSURE STERI-STRIP 1/2X4 (GAUZE/BANDAGES/DRESSINGS) ×1
CLOSURE WOUND 1/2 X4 (GAUZE/BANDAGES/DRESSINGS)
CLSR STERI-STRIP ANTIMIC 1/2X4 (GAUZE/BANDAGES/DRESSINGS) ×2 IMPLANT
CORDS BIPOLAR (ELECTRODE) ×3 IMPLANT
COVER SURGICAL LIGHT HANDLE (MISCELLANEOUS) ×3 IMPLANT
CUFF TOURNIQUET SINGLE 18IN (TOURNIQUET CUFF) IMPLANT
CUFF TOURNIQUET SINGLE 24IN (TOURNIQUET CUFF) IMPLANT
DECANTER SPIKE VIAL GLASS SM (MISCELLANEOUS) IMPLANT
DRAPE OEC MINIVIEW 54X84 (DRAPES) IMPLANT
DRAPE SURG 17X23 STRL (DRAPES) ×3 IMPLANT
DURAPREP 26ML APPLICATOR (WOUND CARE) ×3 IMPLANT
GAUZE SPONGE 4X4 12PLY STRL (GAUZE/BANDAGES/DRESSINGS) IMPLANT
GAUZE XEROFORM 1X8 LF (GAUZE/BANDAGES/DRESSINGS) IMPLANT
GLOVE SURG SYN 8.0 (GLOVE) ×3 IMPLANT
GOWN STRL REUS W/ TWL LRG LVL3 (GOWN DISPOSABLE) ×1 IMPLANT
GOWN STRL REUS W/ TWL XL LVL3 (GOWN DISPOSABLE) ×1 IMPLANT
GOWN STRL REUS W/TWL LRG LVL3 (GOWN DISPOSABLE) ×3
GOWN STRL REUS W/TWL XL LVL3 (GOWN DISPOSABLE) ×3
KIT BASIN OR (CUSTOM PROCEDURE TRAY) ×3 IMPLANT
KIT ROOM TURNOVER OR (KITS) ×3 IMPLANT
MANIFOLD NEPTUNE II (INSTRUMENTS) ×3 IMPLANT
NEEDLE HYPO 25GX1X1/2 BEV (NEEDLE) IMPLANT
NS IRRIG 1000ML POUR BTL (IV SOLUTION) ×3 IMPLANT
PACK ORTHO EXTREMITY (CUSTOM PROCEDURE TRAY) ×3 IMPLANT
PAD ARMBOARD 7.5X6 YLW CONV (MISCELLANEOUS) ×6 IMPLANT
PAD CAST 3X4 CTTN HI CHSV (CAST SUPPLIES) ×1 IMPLANT
PAD CAST 4YDX4 CTTN HI CHSV (CAST SUPPLIES) IMPLANT
PADDING CAST COTTON 3X4 STRL (CAST SUPPLIES) ×3
PADDING CAST COTTON 4X4 STRL (CAST SUPPLIES)
SPONGE GAUZE 4X4 12PLY STER LF (GAUZE/BANDAGES/DRESSINGS) ×3 IMPLANT
STRIP CLOSURE SKIN 1/2X4 (GAUZE/BANDAGES/DRESSINGS) IMPLANT
SUT ETHIBOND 3-0 V-5 (SUTURE) IMPLANT
SUT ETHILON 4 0 PS 2 18 (SUTURE) ×3 IMPLANT
SUT ETHILON 5 0 PS 2 18 (SUTURE) IMPLANT
SUT PROLENE 3 0 PS 2 (SUTURE) IMPLANT
SUT SILK 4 0 PS 2 (SUTURE) IMPLANT
SUT VIC AB 3-0 FS2 27 (SUTURE) IMPLANT
SUT VIC AB 4-0 P-3 18X BRD (SUTURE) IMPLANT
SUT VIC AB 4-0 P3 18 (SUTURE)
SYR CONTROL 10ML LL (SYRINGE) IMPLANT
TOWEL OR 17X24 6PK STRL BLUE (TOWEL DISPOSABLE) ×3 IMPLANT
TOWEL OR 17X26 10 PK STRL BLUE (TOWEL DISPOSABLE) ×3 IMPLANT
TUBE CONNECTING 12'X1/4 (SUCTIONS)
TUBE CONNECTING 12X1/4 (SUCTIONS) IMPLANT
UNDERPAD 30X30 INCONTINENT (UNDERPADS AND DIAPERS) ×3 IMPLANT
WATER STERILE IRR 1000ML POUR (IV SOLUTION) ×3 IMPLANT

## 2015-08-25 NOTE — Anesthesia Procedure Notes (Addendum)
Procedure Name: MAC Date/Time: 08/25/2015 12:35 PM Performed by: Scheryl Darter Pre-anesthesia Checklist: Patient identified, Emergency Drugs available, Suction available, Patient being monitored and Timeout performed Patient Re-evaluated:Patient Re-evaluated prior to inductionOxygen Delivery Method: Simple face mask Preoxygenation: Pre-oxygenation with 100% oxygen Intubation Type: IV induction Placement Confirmation: positive ETCO2 and breath sounds checked- equal and bilateral   Anesthesia Regional Block:  Axillary brachial plexus block  Pre-Anesthetic Checklist: ,, timeout performed, Correct Patient, Correct Site, Correct Laterality, Correct Procedure, Correct Position, site marked, Risks and benefits discussed, Surgical consent,  Pre-op evaluation,  Post-op pain management  Laterality: Right  Prep: chloraprep       Needles:  Injection technique: Single-shot  Needle Type: Stimulator Needle - 40     Needle Length: 4cm 4 cm Needle Gauge: 22 and 22 G    Additional Needles:  Procedures: nerve stimulator Axillary brachial plexus block Narrative:  Injection made incrementally with aspirations every 5 mL.  Performed by: Personally  Anesthesiologist: Nolon Nations  Additional Notes: BP cuff, EKG monitors applied. Sedation begun. Nerve location verified with U/S. Anesthetic injected incrementally, slowly , and after neg aspirations under direct u/s guidance. Good perineural spread. Tolerated well.

## 2015-08-25 NOTE — Transfer of Care (Signed)
Immediate Anesthesia Transfer of Care Note  Patient: Richard [name]  Procedure(s) Performed: Procedure(s): RIGHT THUMB RADICAL COLLATERAL LIGAMENT REPAIR (Right)  Patient Location: PACU  Anesthesia Type:MAC and Regional  Level of Consciousness: awake, alert , oriented and sedated  Airway & Oxygen Therapy: Patient Spontanous Breathing and Patient connected to nasal cannula oxygen  Post-op Assessment: Report given to RN, Post -op Vital signs reviewed and stable and Patient moving all extremities  Post vital signs: Reviewed and stable  Last Vitals:  Filed Vitals:   08/25/15 1225 08/25/15 1330  BP:  107/79  Pulse: 50 60  Temp:  36.6 C  Resp: 16 14    Complications: No apparent anesthesia complications

## 2015-08-25 NOTE — H&P (Signed)
Richard [name] is an 60 y.o. male.   Chief Complaint: right thumb pain and deformity HPI: as above s/p injury in October with pain and deformity to right thumb mcpj  Past Medical History  Diagnosis Date  . HIV (human immunodeficiency virus infection) ([place])   . Hypertension   . Coronary artery disease     a. cath 04/02/2014 3v dx 80-90% in D1, 80-90% mid LAD, 95% apical LAD, 95% distal inferior LV branch of LCx, total occlusion of prox RCA  b). Cath 04/03/2014 DES to mid LAD, DES x3 to prox D1, DAPT indefinitely. LCx lesion untreated  . Stroke ([place])   . Ischemic cardiomyopathy 2015    EF 20-25 percent by echo in 04/2014, s/p [address] Jude [place] model [id number] serial number [id number]    Past Surgical History  Procedure Laterality Date  . Cardiac catheterization  04/02/2014    DR KELLY  . Left and right heart catheterization with coronary angiogram N/A 04/02/2014    Procedure: LEFT AND RIGHT HEART CATHETERIZATION WITH CORONARY ANGIOGRAM;  Surgeon: Troy Sine, MD;  Location: [facility] CATH LAB;  Service: Cardiovascular;  Laterality: N/A;  . Percutaneous coronary stent intervention (pci-s) N/A 04/03/2014    Procedure: PERCUTANEOUS CORONARY STENT INTERVENTION (PCI-S);  Surgeon: Peter M Martinique, MD;  Location: [facility] CATH LAB;  Service: Cardiovascular;  Laterality: N/A;  . Implantable cardioverter defibrillator implant N/A 09/10/2014    Procedure: IMPLANTABLE CARDIOVERTER DEFIBRILLATOR IMPLANT;  Surgeon: Sanda Klein, MD;  [facility] model [id number] serial number [id number]    Family History  Problem Relation Age of Onset  . Heart disease Mother    Social History:  reports that he has never smoked. He has never used smokeless tobacco. He reports that he does not drink alcohol or use illicit drugs.  Allergies:  Allergies  Allergen Reactions  . Ioxaglate Hives  . Ivp Dye [Iodinated Diagnostic Agents] Hives    No prescriptions prior to admission    No results found for this or  any previous visit (from the past 89 hour(s)). No results found.  Review of Systems  All other systems reviewed and are negative.   There were no vitals taken for this visit. Physical Exam  Constitutional: He is oriented to person, place, and time. He appears well-developed and well-nourished.  HENT:  Head: Normocephalic and atraumatic.  Cardiovascular: Normal rate.   Respiratory: Effort normal.  Musculoskeletal:       Right hand: He exhibits tenderness and deformity.  Right thumb radial collateral ligament tear with gross insstability  Neurological: He is alert and oriented to person, place, and time.  Skin: Skin is warm.  Psychiatric: He has a normal mood and affect. His behavior is normal. Judgment and thought content normal.     Assessment/Plan As above  Plan repair of above  Abegail Kloeppel A 08/25/2015, 8:39 AM

## 2015-08-25 NOTE — Op Note (Signed)
Richard [name], Richard [name]           ACCOUNT NO.:  [id number]  MEDICAL RECORD NO.:  [id number]  LOCATION:  [facility]                         FACILITY:  [facility]  PHYSICIAN:  Sheral Apley. Ikeisha Blumberg, M.D.DATE OF BIRTH:  Dec 26, 1955  DATE OF PROCEDURE:  08/25/2015 DATE OF DISCHARGE:  08/25/2015                              OPERATIVE REPORT   PREOPERATIVE DIAGNOSIS:  Acute rupture right thumb radial collateral ligament.  POSTOPERATIVE DIAGNOSIS:  Acute rupture right thumb radial collateral ligament.  PROCEDURE:  Primary repair right thumb radial collateral ligament.  SURGEON:  Sheral Apley. Burney Gauze, M.D.  ASSISTANT:  None.  ANESTHESIA:  Axillary block and IV sedation.  TOURNIQUET TIME:  35 minutes.  COMPLICATIONS:  No complications.  DRAINS:  No drains.  DESCRIPTION OF PROCEDURE:  The patient was taken to the operating suite. After induction of adequate axillary block analgesia and IV sedation using propofol,  the right upper extremity was prepped and draped in usual sterile fashion.  An Esmarch was used to exsanguinate the limb. Tourniquet was inflated to 250 mmHg.  At this point in time, an incision was made along the radial side of the right thumb metacarpophalangeal joint and a dorsally based flap was elevated.  We then retracted the extensor mechanism toward the dorsal and ulnar side and made a small incision through the dorsal radial capsule identifying the base of the proximal phalanx and metacarpal head.  There was a complete rupture of the radial collateral ligament off the proximal phalanx.  We carefully dissected this free from the underlying capsular flap.  We identified the insertion site.  Using a curette and a rongeur,  we created a scarring.  We then used a 2.2-mm Arthrex micro corkscrew anchor which was placed as per protocol through the base of the proximal phalanx along the radial side starting volar proximal and dorsal distal.  Using a FiberWire suture attached to the  corkscrew anchor, we reattached the collateral ligament at its insertion point.  Using a 2-0 FiberWire suture, we then repaired the capsule in a horizontal mattress suture x3 from radial to ulnar thus completing the repair.  The wound was then irrigated, and the skin was closed with a 3-0 Prolene subcuticular stitch.  Steri-Strips, 4x4s, fluffs, and a radial gutter splint was applied.  The patient tolerated the procedure well and went to recovery room in stable fashion.     Sheral Apley Burney Gauze, M.D.     MAW/MEDQ  D:  08/25/2015  T:  08/25/2015  Job:  [id number]

## 2015-08-25 NOTE — Anesthesia Postprocedure Evaluation (Signed)
Anesthesia Post Note  Patient: Richard [name]  Procedure(s) Performed: Procedure(s) (LRB): RIGHT THUMB RADICAL COLLATERAL LIGAMENT REPAIR (Right)  Patient location during evaluation: PACU Anesthesia Type: MAC and Regional Level of consciousness: awake and alert Pain management: pain level controlled Vital Signs Assessment: post-procedure vital signs reviewed and stable Respiratory status: spontaneous breathing Cardiovascular status: stable Anesthetic complications: no    Last Vitals:  Filed Vitals:   08/25/15 1500 08/25/15 1515  BP: 119/85 123/88  Pulse: 53 52  Temp: 36.1 C   Resp: 17     Last Pain:  Filed Vitals:   08/25/15 1515  PainSc: 0-No pain                 Nolon Nations

## 2015-08-25 NOTE — Op Note (Signed)
See note [phone]

## 2015-08-25 NOTE — Progress Notes (Signed)
Nurse called Windle Guard ([facility] representative) and informed him of patients procedure today. Aaron Edelman stated that he could be called IF needed, however he did not think the ICD would interfere with scheduled procedure.

## 2015-08-25 NOTE — Progress Notes (Signed)
Report to Maria T., RN  

## 2015-08-25 NOTE — Anesthesia Preprocedure Evaluation (Addendum)
Anesthesia Evaluation  Patient identified by MRN, date of birth, ID band Patient awake    Reviewed: Allergy & Precautions, NPO status , Patient's Chart, lab work & pertinent test results, reviewed documented beta blocker date and time   Airway Mallampati: II  TM Distance: >3 FB Neck ROM: Full    Dental no notable dental hx.    Pulmonary neg pulmonary ROS,    Pulmonary exam normal breath sounds clear to auscultation       Cardiovascular hypertension, Pt. on medications and Pt. on home beta blockers + angina + CAD  Normal cardiovascular exam Rhythm:Regular Rate:Normal  Echo 2015 - Left ventricle: The cavity size was normal. Systolic function wasseverely reduced. The estimated ejection fraction was in therange of 20% to 25%. Diffuse hypokinesis. There is akinesis ofthe anteroseptal and inferoseptal myocardium. Doppler parametersare consistent with abnormal left ventricular relaxation (grade 1diastolic dysfunction). Doppler parameters are consistent withhigh ventricular filling pressure. - Mitral valve: There was mild regurgitation.  Impressions: - Compared to the prior study, there has been no significantinterval change.    Neuro/Psych PSYCHIATRIC DISORDERS Depression CVA negative psych ROS   GI/Hepatic negative GI ROS, Neg liver ROS,   Endo/Other  negative endocrine ROS  Renal/GU negative Renal ROS     Musculoskeletal negative musculoskeletal ROS (+)   Abdominal   Peds  Hematology negative hematology ROS (+)   Anesthesia Other Findings   Reproductive/Obstetrics                            Anesthesia Physical Anesthesia Plan  ASA: IV  Anesthesia Plan: Regional   Post-op Pain Management:    Induction:   Airway Management Planned:   Additional Equipment:   Intra-op Plan:   Post-operative Plan:   Informed Consent: I have reviewed the patients History and Physical,  chart, labs and discussed the procedure including the risks, benefits and alternatives for the proposed anesthesia with the patient or authorized representative who has indicated his/her understanding and acceptance.   Dental advisory given  Plan Discussed with: CRNA  Anesthesia Plan Comments:         Anesthesia Quick Evaluation

## 2015-08-25 NOTE — Progress Notes (Signed)
Orthopedic Tech Progress Note Patient Details:  Richard [name] 08-02-1956 [id number]  Ortho Devices Type of Ortho Device: Arm sling Ortho Device/Splint Location: RUE Ortho Device/Splint Interventions: Ordered, Application   Braulio Bosch 08/25/2015, 3:25 PM

## 2015-08-26 ENCOUNTER — Encounter (HOSPITAL_COMMUNITY): Payer: Self-pay | Admitting: Orthopedic Surgery

## 2015-08-28 DIAGNOSIS — S63418A Traumatic rupture of collateral ligament of other finger at metacarpophalangeal and interphalangeal joint, initial encounter: Secondary | ICD-10-CM | POA: Diagnosis not present

## 2015-08-28 DIAGNOSIS — M79644 Pain in right finger(s): Secondary | ICD-10-CM | POA: Diagnosis not present

## 2015-09-05 ENCOUNTER — Telehealth: Payer: Self-pay | Admitting: *Deleted

## 2015-09-05 DIAGNOSIS — Z79899 Other long term (current) drug therapy: Secondary | ICD-10-CM | POA: Diagnosis not present

## 2015-09-05 DIAGNOSIS — E785 Hyperlipidemia, unspecified: Secondary | ICD-10-CM | POA: Diagnosis not present

## 2015-09-05 LAB — LIPID PANEL
CHOLESTEROL: 125 mg/dL (ref 125–200)
HDL: 32 mg/dL — ABNORMAL LOW (ref >=40)
LDL Cholesterol: 76 mg/dL (ref <130)
Total CHOL/HDL Ratio: 3.9 Ratio (ref <=5.0)
Triglycerides: 86 mg/dL (ref <150)
VLDL: 17 mg/dL (ref <30)

## 2015-09-05 LAB — COMPREHENSIVE METABOLIC PANEL
ALBUMIN: 4.3 g/dL (ref 3.6–5.1)
ALK PHOS: 76 U/L (ref 40–115)
ALT: 20 U/L (ref 9–46)
AST: 19 U/L (ref 10–35)
BILIRUBIN TOTAL: 0.8 mg/dL (ref 0.2–1.2)
BUN: 16 mg/dL (ref 7–25)
CALCIUM: 9.2 mg/dL (ref 8.6–10.3)
CO2: 27 mmol/L (ref 20–31)
Chloride: 101 mmol/L (ref 98–110)
Creat: 1.49 mg/dL — ABNORMAL HIGH (ref 0.70–1.33)
GLUCOSE: 90 mg/dL (ref 65–99)
Potassium: 4.5 mmol/L (ref 3.5–5.3)
Sodium: 135 mmol/L (ref 135–146)
Total Protein: 7.9 g/dL (ref 6.1–8.1)

## 2015-09-05 NOTE — Telephone Encounter (Signed)
Signed order for Perioperative Rx for implanted cardiac device programming faxed to [facility] 08/27/15.

## 2015-09-06 ENCOUNTER — Encounter (HOSPITAL_COMMUNITY): Payer: Self-pay | Admitting: *Deleted

## 2015-09-06 ENCOUNTER — Emergency Department (INDEPENDENT_AMBULATORY_CARE_PROVIDER_SITE_OTHER)
Admission: EM | Admit: 2015-09-06 | Discharge: 2015-09-06 | Disposition: A | Discharge status: Home / Self Care | Payer: Medicare Other | Source: Home / Self Care | Attending: Family Medicine | Admitting: Family Medicine

## 2015-09-06 DIAGNOSIS — Z711 Person with feared health complaint in whom no diagnosis is made: Secondary | ICD-10-CM

## 2015-09-06 DIAGNOSIS — R1915 Other abnormal bowel sounds: Secondary | ICD-10-CM

## 2015-09-06 NOTE — Discharge Instructions (Signed)
If you develop additional problems such as abdominal pain, vomiting, diarrhea, absence of having stools, blood in the stools, fever or other problems seek medical attention promptly. Otherwise, Follow-up with the [facility]. Your abdominal exam today is normal.

## 2015-09-06 NOTE — ED Notes (Signed)
C/O frequent intermittent "loud bubbling" in stomach with a lot of gas since December.  Denies any abd pain, fevers, n/v.  Has tried Pepto and pantoprazole without relief.

## 2015-09-06 NOTE — ED Provider Notes (Signed)
CSN: [id number]     Arrival date & time 09/06/15  1307 History   First MD Initiated Contact with Patient 09/06/15 1334     Chief Complaint  Patient presents with  . GI Problem   (Consider location/radiation/quality/duration/timing/severity/associated sxs/prior Treatment) HPI Comments: 60 year old male with history of HIV, CAD, ischemic cardiomyopathy and hypertension presents with a complaint of intermittent laterality  bowel sounds for one month. On occasion his bowel sounds are unusually loud and makes him feel comfortable. denies associated pain. On occasion he will have nausea. No vomiting. No diarrhea or constipation. No painful BMs. No bleeding. It first started during the holidays in December. It does not occur on a daily basis. It is noted that he takes several medications for his chronic conditions.   Patient is a 60 y.o. male presenting with GI illness.  GI Problem Pertinent negatives include no chest pain, no abdominal pain and no shortness of breath.    Past Medical History  Diagnosis Date  . HIV (human immunodeficiency virus infection) ([place])   . Hypertension   . Coronary artery disease     a. cath 04/02/2014 3v dx 80-90% in D1, 80-90% mid LAD, 95% apical LAD, 95% distal inferior LV branch of LCx, total occlusion of prox RCA  b). Cath 04/03/2014 DES to mid LAD, DES x3 to prox D1, DAPT indefinitely. LCx lesion untreated  . Stroke ([place])   . Ischemic cardiomyopathy 2015    EF 20-25 percent by echo in 04/2014, s/p [address]. Jude Trent model [id number] serial number [id number]   Past Surgical History  Procedure Laterality Date  . Cardiac catheterization  04/02/2014    DR KELLY  . Left and right heart catheterization with coronary angiogram N/A 04/02/2014    Procedure: LEFT AND RIGHT HEART CATHETERIZATION WITH CORONARY ANGIOGRAM;  Surgeon: Troy Sine, MD;  Location[facility] C[facility] LAB;  Service: Cardiovascular;  Laterality: N/A;  . Percutaneous coronary stent intervention (pci-s) N/A  04/03/2014    Procedure: PERCUTANEOUS CORONARY STENT INTERVENTION (PCI-S);  Surgeon: Peter M Martinique, MD;  [facility] C[facility] LAB;  Service: Cardiovascular;  Laterality: N/A;  . Implantable cardioverter defibrillator implant N/A 09/10/2014    Procedure: IMPLANTABLE CARDIOVERTER DEFIBRILLATOR IMPLANT;  Surgeon: Sanda Klein, MD;  [place] [id number] serial number [phone] [id number]  . Medial collateral ligament repair, knee Right 08/25/2015    Procedure: RIGHT THUMB RADICAL COLLATERAL LIGAMENT REPAIR;  Surgeon: Charlotte Crumb, MD;  Location: [facility];  Service: Orthopedics;  Laterality: Right;   Family History  Problem Relation Age of Onset  . Heart disease Mother    Social History  Substance Use Topics  . Smoking status: Never Smoker   . Smokeless tobacco: Never Used  . Alcohol Use: No    Review of Systems  Constitutional: Negative for fever, activity change and fatigue.  HENT: Negative.   Respiratory: Negative.  Negative for cough and shortness of breath.   Cardiovascular: Negative.  Negative for chest pain, palpitations and leg swelling.  Gastrointestinal: Positive for nausea. Negative for vomiting, abdominal pain, diarrhea, constipation and blood in stool.  Genitourinary: Negative.   Skin: Negative.   Neurological: Negative.     Allergies  Ioxaglate and Ivp dye  Home Medications   Prior to Admission medications   Medication Sig Start Date End Date Taking? Authorizing Provider  aspirin 81 MG chewable tablet Chew 81 mg by mouth daily.   Yes Historical Provider, MD  atorvastatin (LIPITOR) 20 MG tablet Take 20 mg by mouth daily.  Yes Historical Provider, MD  clopidogrel (PLAVIX) 75 MG tablet Take 1 tablet (75 mg total) by mouth daily with breakfast. 04/04/14  Yes Almyra Deforest, PA  clotrimazole (LOTRIMIN) 1 % cream Apply 1 application topically as needed (For itching of foot).   Yes Historical Provider, MD  Darunavir Ethanolate (PREZISTA) 800 MG tablet Take 800 mg by mouth  daily with breakfast.   Yes Historical Provider, MD  diphenhydrAMINE (BENADRYL) 25 MG tablet Take 25 mg by mouth 2 (two) times daily.   Yes Historical Provider, MD  dolutegravir (TIVICAY) 50 MG tablet Take 50 mg by mouth daily.   Yes Historical Provider, MD  ezetimibe (ZETIA) 10 MG tablet Take 5 mg by mouth daily.    Yes Historical Provider, MD  furosemide (LASIX) 20 MG tablet Take 1 tablet (20 mg total) by mouth daily as needed. 06/25/14  Yes Troy Sine, MD  ipratropium (ATROVENT) 0.06 % nasal spray Place 2 sprays into both nostrils 4 (four) times daily as needed for rhinitis. 10/05/13  Yes Audelia Hives Presson, PA  isosorbide mononitrate (IMDUR) 30 MG 24 hr tablet Take 0.5 tablets (15 mg total) by mouth daily. 03/24/15  Yes Troy Sine, MD  lisinopril (PRINIVIL,ZESTRIL) 2.5 MG tablet Take 1 tablet (2.5 mg total) by mouth at bedtime. 01/09/15  Yes Troy Sine, MD  metoprolol (LOPRESSOR) 50 MG tablet Take 1 tablet in the morning and 1/2 tablet at night 06/25/14  Yes Troy Sine, MD  Omega-3 Fatty Acids (FISH OIL) 1000 MG CAPS Take 1,000 mg by mouth daily.   Yes Historical Provider, MD  oxyCODONE-acetaminophen (ROXICET) 5-325 MG tablet Take 1 tablet by mouth every 4 (four) hours as needed for severe pain. 08/25/15  Yes Charlotte Crumb, MD  pantoprazole (PROTONIX) 40 MG tablet Take 1 tablet (40 mg total) by mouth daily as needed. 04/04/14  Yes Almyra Deforest, PA  Polyethyl Glycol-Propyl Glycol (SYSTANE ULTRA OP) Apply 1 drop to eye daily.   Yes Historical Provider, MD  risperiDONE (RISPERDAL) 2 MG tablet Take 2 mg by mouth daily.    Yes Historical Provider, MD  ritonavir (NORVIR) 100 MG TABS tablet Take 1 mg by mouth daily with breakfast.   Yes Historical Provider, MD  loratadine (CLARITIN) 10 MG tablet Take 10 mg by mouth daily as needed for allergies.    Historical Provider, MD  tamsulosin (FLOMAX) 0.4 MG CAPS capsule Take 1 capsule (0.4 mg total) by mouth daily. 04/21/15   Leone Brand, MD    Meds Ordered and Administered this Visit  Medications - No data to display  BP 94/67 mmHg  Pulse 66  Temp(Src) 96.7 F (35.9 C) (Oral)  Resp 18  SpO2 98% No data found.   Physical Exam  Constitutional: He appears well-developed and well-nourished. No distress.  Neck: Normal range of motion. Neck supple.  Cardiovascular: Normal rate, regular rhythm, normal heart sounds and intact distal pulses.   Pulmonary/Chest: Effort normal and breath sounds normal. No respiratory distress. He has no wheezes. He has no rales.  Abdominal: Soft. Bowel sounds are normal. He exhibits no distension and no mass. There is no tenderness. There is no rebound and no guarding.  Bowel sounds are  normoactive, not unusual or abnormal. Abdominal exam is unremarkable.  Musculoskeletal: Normal range of motion. He exhibits no edema.  Neurological: He is alert. No cranial nerve deficit. He exhibits normal muscle tone.  Skin: Skin is warm and dry.  Psychiatric: He has a normal mood and affect.  Nursing note and vitals reviewed.   ED Course  Procedures (including critical care time)  Labs Review Labs Reviewed - No data to display  Imaging Review No results found.   Visual Acuity Review  Right Eye Distance:   Left Eye Distance:   Bilateral Distance:    Right Eye Near:   Left Eye Near:    Bilateral Near:         MDM   1. Other abnormal bowel sounds   2. Physically well but worried    If you develop additional problems such as abdominal pain, vomiting, diarrhea, absence of having stools, blood in the stools, fever or other problems seek medical attention promptly. Otherwise, Follow-up with the [facility]. Your abdominal exam today is normal. This may be a side effect of some of your medications.  Avoid caffeine.    Janne Napoleon, NP 09/06/15 1419

## 2015-09-08 ENCOUNTER — Telehealth: Payer: Self-pay | Admitting: Cardiology

## 2015-09-08 NOTE — Telephone Encounter (Signed)
Informed patient that remote was received and he did not have any alerts from his ICD. I explained to him that the "vibration" that he may have felt did not come from his device. Patient voiced understanding.

## 2015-09-08 NOTE — Telephone Encounter (Signed)
Pt called and stated that he believes that his alert went off for his ICD. Pt is sending a remote transmission.

## 2015-09-16 ENCOUNTER — Ambulatory Visit (INDEPENDENT_AMBULATORY_CARE_PROVIDER_SITE_OTHER): Payer: Medicare Other | Admitting: Cardiovascular Disease

## 2015-09-16 ENCOUNTER — Encounter: Payer: Self-pay | Admitting: Cardiovascular Disease

## 2015-09-16 VITALS — BP 90/64 | HR 61 | Ht 70.0 in | Wt 189.0 lb

## 2015-09-16 DIAGNOSIS — I10 Essential (primary) hypertension: Secondary | ICD-10-CM | POA: Diagnosis not present

## 2015-09-16 DIAGNOSIS — E785 Hyperlipidemia, unspecified: Secondary | ICD-10-CM

## 2015-09-16 DIAGNOSIS — Z95810 Presence of automatic (implantable) cardiac defibrillator: Secondary | ICD-10-CM

## 2015-09-16 DIAGNOSIS — I255 Ischemic cardiomyopathy: Secondary | ICD-10-CM | POA: Diagnosis not present

## 2015-09-16 DIAGNOSIS — Z8673 Personal history of transient ischemic attack (TIA), and cerebral infarction without residual deficits: Secondary | ICD-10-CM

## 2015-09-16 DIAGNOSIS — I2510 Atherosclerotic heart disease of native coronary artery without angina pectoris: Secondary | ICD-10-CM

## 2015-09-16 DIAGNOSIS — B20 Human immunodeficiency virus [HIV] disease: Secondary | ICD-10-CM

## 2015-09-16 MED ORDER — METOPROLOL TARTRATE 50 MG PO TABS: ORAL_TABLET | ORAL | Status: DC

## 2015-09-16 NOTE — Patient Instructions (Signed)
Your physician has recommended you make the following change in your medication:  1.) the lopressor has been decreased to 1/2 tablet twice daily.  Your physician recommends that you schedule a follow-up appointment and echocardiogram in 6 months.

## 2015-09-18 ENCOUNTER — Encounter: Payer: Self-pay | Admitting: Cardiovascular Disease

## 2015-09-18 NOTE — Progress Notes (Signed)
Patient ID: Richard [name], male   DOB: 10-19-1955, 61 y.o.   MRN: [id number]    HPI:  Richard [name] is a 60 y.o. male who presents for a 6 month follow-up cardiology evaluation.   Richard [name] has a history of a CVA in 2003 and has been followed at the [facility] hospital.   In 2003 he underwent cardiac catheterization which  which did not reveal significant coronary artery disease. Prior to catheterization  ejection fraction was 33% with multiple areas of scar noted on a nuclear perfusion study.   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  He states that for the past year 5 years he has had undetected virus.  He has a history of hypertension, as well as hyperlipidemia.  There is also a history of depression, for which he takes Risperdal.  I initially saw him with complaints of some episodes of left-sided chest pain with walking, associated with left arm numbness and increasing shortness of breath with activity and is referred for nuclear study. He underwent a recent nuclear perfusion study on 03/01/2014.  Ejection fraction was about 27%.  This showed extensive scar multiple coronary territories without definitive reversible ischemia.  An echo Doppler study  on 03/01/2014 showed an ejection fraction of 20-25% with severe global hypokinesis, but more pronounced inferior hypokinesis.  He underwent cardiac catheterization by me on 04/02/2014 and was found to have severe  cardiomyopathy with ejection fraction of approximately 15%.  He had multivessel CAD with 80 and 90% stenoses in the first diagonal branch of the LAD, 80-90% LAD stenoses after the first septal perforating artery, and 95% apical LAD stenosis.  There is a 95% distal inferior LV branch stenosis of the distal circumflex and the RCA was totally occluded proximally with left to right collaterals.  He had normal right heart pressures and there was no evidence for Polaner.   Hypertension.  He underwent surgical evaluation and was turned down.  The following day he underwent successful intervention to his diagonal and LAD by Dr. Swaziland with insertion of a 3.0x38 mm Promus DES stent in the mid LAD and 3 stents placed in the diagonal vessel.  I referred him to Dr. Royann Shivers for implantation of a cardio defibrillator and a single-chamber ICD was inserted on 09/10/2014.   He saw Dr. Rubie [name] on 12/31/2014 for follow-up evaluation. Interrogation revealed NSVT but without delivered therapy to date. He is participating in [place] remote follow-up.  Since I last saw him, he states his blood pressure has been low.  He is evaluated at the [facility] every 3-4 months for his HIV.  He is unaware of palpitations.  He denies presyncope or syncope.  He underwent right thumb surgery 1 month ago and apparently tolerated this well from a cardiac standpoint.  He presents for follow-up evaluation.  Past Medical History  Diagnosis Date  . HIV (human immunodeficiency virus infection) (HCC)   . Hypertension   . Coronary artery disease     a. cath 04/02/2014 3v dx 80-90% in D1, 80-90% mid LAD, 95% apical LAD, 95% distal inferior LV branch of LCx, total occlusion of prox RCA  b). Cath 04/03/2014 DES to mid LAD, DES x3 to prox D1, DAPT indefinitely. LCx lesion untreated  . Stroke (HCC)   . Ischemic cardiomyopathy 2015    EF 20-25 percent by echo in 04/2014, s/p [employer] Jude Fortify Assura model [id number] serial number [id number]  Past Surgical History  Procedure Laterality Date  . Cardiac catheterization  04/02/2014    DR Shomari Matusik  . Left and right heart catheterization with coronary angiogram N/A 04/02/2014    Procedure: LEFT AND RIGHT HEART CATHETERIZATION WITH CORONARY ANGIOGRAM;  Surgeon: Lennette Bihari, MD;  Location: [facility] CATH LAB;  Service: Cardiovascular;  Laterality: N/A;  . Percutaneous coronary stent intervention (pci-s) N/A 04/03/2014    Procedure: PERCUTANEOUS CORONARY STENT INTERVENTION  (PCI-S);  Surgeon: Peter M Swaziland, MD;  Location: [facility] CATH LAB;  Service: Cardiovascular;  Laterality: N/A;  . Implantable cardioverter defibrillator implant N/A 09/10/2014    Procedure: IMPLANTABLE CARDIOVERTER DEFIBRILLATOR IMPLANT;  Surgeon: Thurmon Fair, MD;  [facility] [place] model [id number] serial number [id number]  . Medial collateral ligament repair, knee Right 08/25/2015    Procedure: RIGHT THUMB RADICAL COLLATERAL LIGAMENT REPAIR;  Surgeon: Dairl Ponder, MD;  Location: [facility] OR;  Service: Orthopedics;  Laterality: Right;    Allergies  Allergen Reactions  . Ioxaglate Hives  . Ivp Dye [Iodinated Diagnostic Agents] Hives    Current Outpatient Prescriptions  Medication Sig Dispense Refill  . aspirin 81 MG chewable tablet Chew 81 mg by mouth daily.    [name] atorvastatin (LIPITOR) 20 MG tablet Take 20 mg by mouth daily.    . clopidogrel (PLAVIX) 75 MG tablet Take 1 tablet (75 mg total) by mouth daily with breakfast. 30 tablet 0  . clotrimazole (LOTRIMIN) 1 % cream Apply 1 application topically as needed (For itching of foot).    . Darunavir Ethanolate (PREZISTA) 800 MG tablet Take 800 mg by mouth daily with breakfast.    . diphenhydrAMINE (BENADRYL) 25 MG tablet Take 25 mg by mouth 2 (two) times daily.    . dolutegravir (TIVICAY) 50 MG tablet Take 50 mg by mouth daily.    [name] ezetimibe (ZETIA) 10 MG tablet Take 5 mg by mouth daily.     . furosemide (LASIX) 20 MG tablet Take 1 tablet (20 mg total) by mouth daily as needed. 30 tablet 3  . ipratropium (ATROVENT) 0.06 % nasal spray Place 2 sprays into both nostrils 4 (four) times daily as needed for rhinitis.    [name] isosorbide mononitrate (IMDUR) 30 MG 24 hr tablet Take 0.5 tablets (15 mg total) by mouth daily. 30 tablet 6  . lisinopril (PRINIVIL,ZESTRIL) 2.5 MG tablet Take 1 tablet (2.5 mg total) by mouth at bedtime. 30 tablet 6  . loratadine (CLARITIN) 10 MG tablet Take 10 mg by mouth daily as needed for allergies.    . metoprolol  (LOPRESSOR) 50 MG tablet Take 1/2 tablet twice a day. 90 tablet 3  . Omega-3 Fatty Acids (FISH OIL) 1000 MG CAPS Take 1,000 mg by mouth daily.    [name] oxyCODONE-acetaminophen (ROXICET) 5-325 MG tablet Take 1 tablet by mouth every 4 (four) hours as needed for severe pain. 30 tablet 0  . pantoprazole (PROTONIX) 40 MG tablet Take 1 tablet (40 mg total) by mouth daily as needed. 30 tablet 0  . Polyethyl Glycol-Propyl Glycol (SYSTANE ULTRA OP) Apply 1 drop to eye daily.    . risperiDONE (RISPERDAL) 2 MG tablet Take 2 mg by mouth daily.     . ritonavir (NORVIR) 100 MG TABS tablet Take 1 mg by mouth daily with breakfast.    . tamsulosin (FLOMAX) 0.4 MG CAPS capsule Take 1 capsule (0.4 mg total) by mouth daily. 30 capsule 0   No current facility-administered medications for this visit.    Socially, he is married for  25 years.  Has one child.  He is disabled and previously had worked as a Naval architect.  He completed 12th grade education.  There is no tobacco use.  He does drink alcohol.  Family History  Problem Relation Age of Onset  . Heart disease Mother     ROS General: Negative; No fevers, chills, or night sweats HEENT: Negative; No changes in vision or hearing, sinus congestion, difficulty swallowing Pulmonary: Negative; No cough, wheezing, shortness of breath, hemoptysis Cardiovascular:  See HPI; No chest pain, presyncope, syncope, palpitations, edema GI: Negative; No nausea, vomiting, diarrhea, or abdominal pain GU: Negative; No dysuria, hematuria, or difficulty voiding Musculoskeletal: Negative; no myalgias, joint pain, or weakness Hematologic/Oncologic: Positive for HIV well-controlled on chronic therapy for years; no easy bruising, bleeding Endocrine: Negative; no heat/cold intolerance; no diabetes Neuro: Negative; no changes in balance, headaches Skin: Negative; No rashes or skin lesions Psychiatric: History of depression. No behavioral problems,  Sleep: Negative; No daytime  sleepiness, hypersomnolence, bruxism, restless legs, hypnogagnic hallucinations Other comprehensive 14 point system review is negative   Physical Exam BP 90/64 mmHg  Pulse 61  Ht 5\' 10"  (1.778 m)  Wt 189 lb (85.73 kg)  BMI 27.12 kg/m2   Repeat blood pressure by me was 110/68 supine and 88/60  standing.  Pulse was regular in the 60s without significant orthostatic pulse rise.  Wt Readings from Last 3 Encounters:  09/16/15 189 lb (85.73 kg)  08/25/15 193 lb (87.544 kg)  08/20/15 197 lb 2 oz (89.415 kg)   General: Alert, oriented, no distress.  Skin: normal turgor, no rashes, warm and dry HEENT: Normocephalic, atraumatic. Pupils equal round and reactive to light; sclera anicteric; extraocular muscles intact; Fundi poorly visualized, but without hemorrhages or exudates. Nose without nasal septal hypertrophy Mouth/Parynx benign; Mallinpatti scale 2 Neck: No JVD, no carotid bruits; normal carotid upstroke Lungs: clear to ausculatation and percussion; no wheezing or rales Chest wall: without tenderness to palpitation; no ecchymosis or induration at the ICD implantation site. Heart: PMI not displaced, RRR, s1 s2 normal, 1/6 systolic murmur, no diastolic murmur, no rubs, gallops, thrills, or heaves Abdomen: soft, nontender; no hepatosplenomehaly, BS+; abdominal aorta nontender and not dilated by palpation. Rectal exam performed by me: Stool guaiac-negative.  Prostate without nodules. Back: no CVA tenderness Pulses 2+ Musculoskeletal: full range of motion, normal strength, no joint deformities Extremities: no clubbing cyanosis or edema, Homan's sign negative  Neurologic: grossly nonfocal; Cranial nerves grossly wnl Psychologic: Normal mood and affect  ECG (independently read by me):  Normal sinus rhythm at 61 bpm.  No ectopy.  Normal intervals.  QTC 404 ms.  August 2016ECG (independently read by me): Sinus rhythm at 67 bpm.  Isolated PVC.  Right superior axis.  Anterolateral T-wave  changes.  June 2016 ECG (independently read by me):  Normal sinus rhythm at 65 bpm with an occasional PVC with VA conduction  February 2016ECG (independently read by me): Normal sinus rhythm at 63 bpm.  Possible old inferior infarct.  Lateral T-wave changes.  Right superior axis  November 2015 ECG (independently read by me):  Normal sinus rhythm.  Probable old IMI.  T-wave changes V3 through V6.  ECG (independently read by me): normal sinus rhythm at 64 beats per minute.  T-wave inversion V3 through V6,  lead 1, 2 and aVF.  Prior 02/20/2014 ECG (independently read by me): Sinus rhythm with frequent PVCs with transient trigeminy, and evidence for VA conduction with his ectopy.  LABS:  BMP Latest Ref  Rng 09/05/2015 08/25/2015 09/25/2014  Glucose 65 - 99 mg/dL 90 85 80  BUN 7 - 25 mg/dL 16 13 16   Creatinine 0.70 - 1.33 mg/dL 1.61(W) 9.60(A) 5.40  Sodium 135 - 146 mmol/L 135 141 139  Potassium 3.5 - 5.3 mmol/L 4.5 4.4 4.2  Chloride 98 - 110 mmol/L 101 110 105  CO2 20 - 31 mmol/L 27 21(L) 22  Calcium 8.6 - 10.3 mg/dL 9.2 9.1 9.3   Hepatic Function Latest Ref Rng 09/05/2015 09/25/2014 09/04/2014  Total Protein 6.1 - 8.1 g/dL 7.9 7.5 7.1  Albumin 3.6 - 5.1 g/dL 4.3 4.3 4.2  AST 10 - 35 U/L 19 19 18   ALT 9 - 46 U/L 20 23 22   Alk Phosphatase 40 - 115 U/L 76 70 61  Total Bilirubin 0.2 - 1.2 mg/dL 0.8 0.7 0.8   CBC Latest Ref Rng 08/25/2015 09/04/2014 04/23/2014  WBC 4.0 - 10.5 K/uL 4.5 3.1(L) 3.5(L)  Hemoglobin 13.0 - 17.0 g/dL 98.1 19.1 47.8  Hematocrit 39.0 - 52.0 % 42.8 41.6 39.4  Platelets 150 - 400 K/uL 108(L) 141(L) 148(L)   Lab Results  Component Value Date   MCV 79.7 08/25/2015   MCV 80.9 09/04/2014   MCV 78.3 04/23/2014   Lab Results  Component Value Date   TSH 0.917 09/25/2014   Lipid Panel     Component Value Date/Time   CHOL 125 09/05/2015 0848   TRIG 86 09/05/2015 0848   HDL 32* 09/05/2015 0848   CHOLHDL 3.9 09/05/2015 0848   VLDL 17 09/05/2015 0848   LDLCALC 76  09/05/2015 0848    RADIOLOGY: Dg Chest 2 View  02/04/2014   CLINICAL DATA:  Chest and left arm pain.  EXAM: CHEST  2 VIEW  COMPARISON:  None.  FINDINGS: The lungs are clear. Heart size is normal. No pneumothorax or pleural effusion. No focal bony abnormality.  IMPRESSION: Negative chest.   Electronically Signed   By: Drusilla Kanner M.D.   On: 02/04/2014 10:53     ASSESSMENT AND PLAN: [name] is a 60 year old, African American male who suffered a CVA in 2003.   In 2003  he was documented to have a cardiomyopathy   He developed new chest tightness and increasing shortness of breath which led to cardiac catheterization in August 2015.  His ejection fraction was 15% and he underwent successful intervention to a diffusely diseased diagonal and LAD system.  His RCA is occluded and is collateralized.  He does have very distal circumflex stenosis.  He has not had any recurrent anginal symptoms since his intervention.  Following revascularization LV function remained low and  he underwent prophylactic ICD implantation in light of increased sudden cardiac death risk.  He has not had any defibrillator shocks, but has been noted to have some brief episodes of NSVT.  He has had continued difficulty with low blood pressure.  Despite reduction of his medical regimen. He now is on Toprol 50 mg in the morning and 25 mg at night, lisinopril 2.5 mg, isosorbide mononitrate 15 mg, and furosemide which he has only taken on an as-needed basis 20 mg. He is orthostatic on exam today.  I am reducing his metoprolol to 25 mg twice a day.  He continues to have decreased blood pressure I would then discontinue his isosorbide. He is unaware of any defibrillator discharges and denies awareness of palpitations.  He continues to be on dual antiplatelet therapy for his CAD and coronary stents. His HIV is stable with undetectable  viral load on his retroviral medications.  He does have GERD but this is controlled with  Protonix.  I am scheduling him for follow-up echo Doppler study in 6 months and  will see him in following his echo for reevaluation.  Time spent: 25 minutes  Lennette Bihari, MD, [facility] 09/18/2015 1:13 PM

## 2015-09-26 NOTE — Addendum Note (Signed)
Addended by: Diana Eves on: 09/26/2015 11:47 AM   Modules accepted: Orders

## 2015-09-30 ENCOUNTER — Ambulatory Visit (INDEPENDENT_AMBULATORY_CARE_PROVIDER_SITE_OTHER): Payer: Medicare Other | Admitting: *Deleted

## 2015-09-30 DIAGNOSIS — I255 Ischemic cardiomyopathy: Secondary | ICD-10-CM

## 2015-10-01 NOTE — Progress Notes (Signed)
Remote ICD transmission.   

## 2015-10-14 DIAGNOSIS — M25649 Stiffness of unspecified hand, not elsewhere classified: Secondary | ICD-10-CM | POA: Diagnosis not present

## 2015-10-14 DIAGNOSIS — M6281 Muscle weakness (generalized): Secondary | ICD-10-CM | POA: Diagnosis not present

## 2015-10-14 DIAGNOSIS — S63418D Traumatic rupture of collateral ligament of other finger at metacarpophalangeal and interphalangeal joint, subsequent encounter: Secondary | ICD-10-CM | POA: Diagnosis not present

## 2015-10-21 DIAGNOSIS — M6281 Muscle weakness (generalized): Secondary | ICD-10-CM | POA: Diagnosis not present

## 2015-10-21 DIAGNOSIS — S63418D Traumatic rupture of collateral ligament of other finger at metacarpophalangeal and interphalangeal joint, subsequent encounter: Secondary | ICD-10-CM | POA: Diagnosis not present

## 2015-10-21 DIAGNOSIS — M25649 Stiffness of unspecified hand, not elsewhere classified: Secondary | ICD-10-CM | POA: Diagnosis not present

## 2015-10-24 ENCOUNTER — Encounter: Payer: Self-pay | Admitting: Cardiology

## 2015-10-24 LAB — CUP PACEART REMOTE DEVICE CHECK
Battery Remaining Longevity: 98 mo
Battery Voltage: 3.1 V
Brady Statistic RV Percent Paced: 1 %
HIGH POWER IMPEDANCE MEASURED VALUE: 71 Ohm
HighPow Impedance: 71 Ohm
Lead Channel Impedance Value: 360 Ohm
Lead Channel Setting Pacing Pulse Width: 0.5 ms
Lead Channel Setting Sensing Sensitivity: 0.5 mV
MDC IDC LEAD IMPLANT DT: 20160202
MDC IDC LEAD LOCATION: 753860
MDC IDC MSMT BATTERY REMAINING PERCENTAGE: 88 %
MDC IDC MSMT LEADCHNL RV SENSING INTR AMPL: 10.2 mV
MDC IDC SESS DTM: 20170221070023
MDC IDC SET LEADCHNL RV PACING AMPLITUDE: 2.5 V
Pulse Gen Serial Number: 7229147

## 2015-10-30 DIAGNOSIS — M6281 Muscle weakness (generalized): Secondary | ICD-10-CM | POA: Diagnosis not present

## 2015-10-30 DIAGNOSIS — M25649 Stiffness of unspecified hand, not elsewhere classified: Secondary | ICD-10-CM | POA: Diagnosis not present

## 2015-10-30 DIAGNOSIS — S63418D Traumatic rupture of collateral ligament of other finger at metacarpophalangeal and interphalangeal joint, subsequent encounter: Secondary | ICD-10-CM | POA: Diagnosis not present

## 2015-11-06 DIAGNOSIS — M25649 Stiffness of unspecified hand, not elsewhere classified: Secondary | ICD-10-CM | POA: Diagnosis not present

## 2015-11-06 DIAGNOSIS — M6281 Muscle weakness (generalized): Secondary | ICD-10-CM | POA: Diagnosis not present

## 2015-11-06 DIAGNOSIS — S63418D Traumatic rupture of collateral ligament of other finger at metacarpophalangeal and interphalangeal joint, subsequent encounter: Secondary | ICD-10-CM | POA: Diagnosis not present

## 2015-11-27 DIAGNOSIS — S63418D Traumatic rupture of collateral ligament of other finger at metacarpophalangeal and interphalangeal joint, subsequent encounter: Secondary | ICD-10-CM | POA: Diagnosis not present

## 2015-12-02 ENCOUNTER — Telehealth: Payer: Self-pay | Admitting: Cardiovascular Disease

## 2015-12-02 NOTE — Telephone Encounter (Signed)
Pt aware of our recommendations and voiced understanding --- he will confer w/ HIV clinic before taking this new probiotic regimen.

## 2015-12-02 NOTE — Telephone Encounter (Signed)
Pt called in stating that he would like to start taking a Probiotic(Fortify) and he wanted to make sure that this will not interfere with his other medications. Please f/u with him  Thanks

## 2015-12-02 NOTE — Telephone Encounter (Signed)
Probiotic should be fine with his cardiac medications, but I would advise him to call his HIV clinic to ensure they are comfortable with him taking a probiotic. Thanks

## 2015-12-30 ENCOUNTER — Ambulatory Visit (INDEPENDENT_AMBULATORY_CARE_PROVIDER_SITE_OTHER): Payer: Medicare Other | Admitting: Cardiovascular Disease

## 2015-12-30 ENCOUNTER — Encounter: Payer: Self-pay | Admitting: Cardiovascular Disease

## 2015-12-30 VITALS — BP 108/71 | HR 76 | Ht 70.0 in | Wt 189.6 lb

## 2015-12-30 DIAGNOSIS — I2510 Atherosclerotic heart disease of native coronary artery without angina pectoris: Secondary | ICD-10-CM

## 2015-12-30 DIAGNOSIS — I10 Essential (primary) hypertension: Secondary | ICD-10-CM | POA: Diagnosis not present

## 2015-12-30 DIAGNOSIS — I472 Ventricular tachycardia: Secondary | ICD-10-CM | POA: Diagnosis not present

## 2015-12-30 DIAGNOSIS — I255 Ischemic cardiomyopathy: Secondary | ICD-10-CM | POA: Diagnosis not present

## 2015-12-30 DIAGNOSIS — E785 Hyperlipidemia, unspecified: Secondary | ICD-10-CM

## 2015-12-30 DIAGNOSIS — Z95810 Presence of automatic (implantable) cardiac defibrillator: Secondary | ICD-10-CM

## 2015-12-30 DIAGNOSIS — I4729 Other ventricular tachycardia: Secondary | ICD-10-CM

## 2015-12-30 DIAGNOSIS — I5042 Chronic combined systolic (congestive) and diastolic (congestive) heart failure: Secondary | ICD-10-CM

## 2015-12-30 LAB — CUP PACEART INCLINIC DEVICE CHECK
Date Time Interrogation Session: 20170523092817
Implantable Lead Implant Date: 20160202
Implantable Lead Location: 753860
MDC IDC PG SERIAL: 7229147
MDC IDC SET LEADCHNL RV PACING AMPLITUDE: 2.5 V
MDC IDC SET LEADCHNL RV PACING PULSEWIDTH: 0.5 ms
MDC IDC SET LEADCHNL RV SENSING SENSITIVITY: 0.5 mV

## 2015-12-30 NOTE — Patient Instructions (Signed)
Dr Sallyanne Kuster recommends that you continue on your current medications as directed. Please refer to the Current Medication list given to you today.  Remote monitoring is used to monitor your Pacemaker of ICD from home. This monitoring reduces the number of office visits required to check your device to one time per year. It allows [place] to keep an eye on the functioning of your device to ensure it is working properly. You are scheduled for a device check from home on Thursday, August 24th, 2017. You may send your transmission at any time that day. If you have a wireless device, the transmission will be sent automatically. After your physician reviews your transmission, you will receive a postcard with your next transmission date.   Dr Sallyanne Kuster recommends that you schedule a follow-up appointment in 12 months with a defibrillator check. You will receive a reminder letter in the mail two months in advance. If you don't receive a letter, please call our office to schedule the follow-up appointment.  If you need a refill on your cardiac medications before your next appointment, please call your pharmacy.

## 2015-12-30 NOTE — Progress Notes (Signed)
Patient ID: Richard [name], male   DOB: [birth year], 60 y.o.   MRN: [id number]    Cardiology Office Note    Date:  12/30/2015   ID:  Richard [name], DOB [birth year], MRN [id number]  PCP:  PROVIDER NOT IN SYSTEM  Cardiologist:  Shelva Majestic, M.D.; Sanda Klein, MD   Chief Complaint  Patient presents with  . Follow-up    defib check    History of Present Illness:  Richard [name] is a 60 y.o. 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 Jude defibrillator for primary prevention.  Defibrillator interrogation shows normal device function. Estimated generator longevity is around 8 years. Lead parameters are excellent. He has not had any ventricular arrhythmia and has not required ventricular pacing.  He last saw Dr. Claiborne Billings about 3 months ago and has not had any new cardiac problems since that time. He appears to have very well compensated heart failure, NYHA functional class I-II on a very low dose of loop diuretic. He is receiving low dose of ACE inhibitor and beta blocker due to relative hypotension. He denies dyspnea either at rest or with exertion, angina pectoris, edema, dizziness, palpitations, syncope, focal neurological events or bleeding.  His major complaint is constant gurgling in his stomach. He has sought emergency attention for this since it is so troubling. He does not have abdominal pain or change in bowel pattern. [facility] has suggested his symptoms might be due to ezetimibe but temporarily holding that medication did not lead to any benefit. He takes pantoprazole for gastroesophageal reflux disease. He has treated HIV infection with undetectable viral load. He has not had any bleeding problems on chronic treatment with clopidogrel.  Coronary artery  disease problems date back to the early 2000's. His last cardiac catheterization in 2015 led to placement of a 3.0 x 38 mm Promus drug-eluting stent in the mid LAD artery and 3 stents in the diagonal vessel. His nuclear stress test in 2015 showed severely reduced trace uptake in the entire anterior wall, apex and inferior wall and moderately reduced uptake in the lateral wall. His ejection fraction was 27% by nuclear scintigraphy and 20-25 percent by echocardiography. He has a remote history of right temporal ischemic stroke in 2008.   Past Medical History  Diagnosis Date  . HIV (human immunodeficiency virus infection) ([place])   . Hypertension   . Coronary artery disease     a. cath 04/02/2014 3v dx 80-90% in D1, 80-90% mid LAD, 95% apical LAD, 95% distal inferior LV branch of LCx, total occlusion of prox RCA  b). Cath 04/03/2014 DES to mid LAD, DES x3 to prox D1, DAPT indefinitely. LCx lesion untreated  . Stroke ([place])   . Ischemic cardiomyopathy 2015    EF 20-25 percent by echo in 04/2014, s/p [address]. Jude [place] model [id number] serial number [id number]    Past Surgical History  Procedure Laterality Date  . Cardiac catheterization  04/02/2014    DR KELLY  . Left and right heart catheterization with coronary angiogram N/A 04/02/2014    Procedure: LEFT AND RIGHT HEART CATHETERIZATION WITH CORONARY ANGIOGRAM;  Surgeon: Troy Sine, MD;  Location: [facility] C[facility] LAB;  Service: Cardiovascular;  Laterality: N/A;  . Percutaneous coronary stent intervention (pci-s) N/A 04/03/2014    Procedure: PERCUTANEOUS CORONARY STENT INTERVENTION (PCI-S);  Surgeon: Peter M Martinique,  MD;  Location: [place] CATH LAB;  Service: Cardiovascular;  Laterality: N/A;  . Implantable cardioverter defibrillator implant N/A 09/10/2014    Procedure: IMPLANTABLE CARDIOVERTER DEFIBRILLATOR IMPLANT;  Surgeon: Sanda Klein, MD;  [facility] [id number] serial number [id number]  . Medial collateral ligament repair, knee Right  08/25/2015    Procedure: RIGHT THUMB RADICAL COLLATERAL LIGAMENT REPAIR;  Surgeon: Charlotte Crumb, MD;  Location: [place];  Service: Orthopedics;  Laterality: Right;    Current Medications: Outpatient Prescriptions Prior to Visit  Medication Sig Dispense Refill  . aspirin 81 MG chewable tablet Chew 81 mg by mouth daily.    [name] atorvastatin (LIPITOR) 20 MG tablet Take 20 mg by mouth daily.    . clopidogrel (PLAVIX) 75 MG tablet Take 1 tablet (75 mg total) by mouth daily with breakfast. 30 tablet 0  . clotrimazole (LOTRIMIN) 1 % cream Apply 1 application topically as needed (For itching of foot).    . Darunavir Ethanolate (PREZISTA) 800 MG tablet Take 800 mg by mouth daily with breakfast.    . diphenhydrAMINE (BENADRYL) 25 MG tablet Take 25 mg by mouth 2 (two) times daily.    . dolutegravir (TIVICAY) 50 MG tablet Take 50 mg by mouth daily.    [name] ezetimibe (ZETIA) 10 MG tablet Take 5 mg by mouth daily.     . furosemide (LASIX) 20 MG tablet Take 1 tablet (20 mg total) by mouth daily as needed. 30 tablet 3  . ipratropium (ATROVENT) 0.06 % nasal spray Place 2 sprays into both nostrils 4 (four) times daily as needed for rhinitis.    [name] isosorbide mononitrate (IMDUR) 30 MG 24 hr tablet Take 0.5 tablets (15 mg total) by mouth daily. 30 tablet 6  . lisinopril (PRINIVIL,ZESTRIL) 2.5 MG tablet Take 1 tablet (2.5 mg total) by mouth at bedtime. 30 tablet 6  . loratadine (CLARITIN) 10 MG tablet Take 10 mg by mouth daily as needed for allergies.    . metoprolol (LOPRESSOR) 50 MG tablet Take 1/2 tablet twice a day. 90 tablet 3  . Omega-3 Fatty Acids (FISH OIL) 1000 MG CAPS Take 1,000 mg by mouth daily.    [name] oxyCODONE-acetaminophen (ROXICET) 5-325 MG tablet Take 1 tablet by mouth every 4 (four) hours as needed for severe pain. 30 tablet 0  . pantoprazole (PROTONIX) 40 MG tablet Take 1 tablet (40 mg total) by mouth daily as needed. 30 tablet 0  . Polyethyl Glycol-Propyl Glycol (SYSTANE ULTRA OP) Apply 1 drop to eye  daily.    . risperiDONE (RISPERDAL) 2 MG tablet Take 2 mg by mouth daily.     . ritonavir (NORVIR) 100 MG TABS tablet Take 1 mg by mouth daily with breakfast.    . tamsulosin (FLOMAX) 0.4 MG CAPS capsule Take 1 capsule (0.4 mg total) by mouth daily. 30 capsule 0   No facility-administered medications prior to visit.     Allergies:   Ioxaglate and Ivp dye   Social History   Social History  . Marital Status: Married    Spouse Name: N/A  . Number of Children: N/A  . Years of Education: N/A   Social History Main Topics  . Smoking status: Never Smoker   . Smokeless tobacco: Never Used  . Alcohol Use: No  . Drug Use: No  . Sexual Activity: Not Asked   Other Topics Concern  . None   Social History Narrative     Family History:  The patient's family history includes Heart disease in his  mother.   ROS:   Please see the history of present illness.    ROS All other systems reviewed and are negative.   PHYSICAL EXAM:   VS:  BP 108/71 mmHg  Pulse 76  Ht 5\' 10"  (1.778 m)  Wt 86.002 kg (189 lb 9.6 oz)  BMI 27.20 kg/m2   GEN: Well nourished, well developed, in no acute distress HEENT: normal Neck: no JVD, carotid bruits, or masses Cardiac: Laterally displaced apical impulse, RRR; no murmurs, rubs, or gallops,no edema , healthy defibrillator site Respiratory:  clear to auscultation bilaterally, normal work of breathing GI: soft, nontender, nondistended, + BS MS: no deformity or atrophy Skin: warm and dry, no rash Neuro:  Alert and Oriented x 3, Strength and sensation are intact Psych: euthymic mood, full affect  Wt Readings from Last 3 Encounters:  12/30/15 86.002 kg (189 lb 9.6 oz)  09/16/15 85.73 kg (189 lb)  08/25/15 87.544 kg (193 lb)      Studies/Labs Reviewed:   EKG:  EKG is not ordered today.    Recent Labs: 08/25/2015: Hemoglobin 14.9; Platelets 108* 09/05/2015: ALT 20; BUN 16; Creat 1.49*; Potassium 4.5; Sodium 135   Lipid Panel    Component Value  Date/Time   CHOL 125 09/05/2015 0848   TRIG 86 09/05/2015 0848   HDL 32* 09/05/2015 0848   CHOLHDL 3.9 09/05/2015 0848   VLDL 17 09/05/2015 0848   LDLCALC 76 09/05/2015 0848     ASSESSMENT:    1. Chronic combined systolic (congestive) and diastolic (congestive) heart failure (HCC)   2. 3-vessel coronary artery disease   3. NSVT (nonsustained ventricular tachycardia) ([place])   4. Essential hypertension   5. Hyperlipidemia   6. ICD (implantable cardioverter-defibrillator) in place      PLAN:  In order of problems listed above:  1. CHF: Well compensated, NYHA functional class I-II. Requires a very low dose of loop diuretic and appears euvolemic. On relatively low doses of vasodilator/beta blocker meds due to low blood pressure. Most recently estimated EF less than 30%. 2. CAD: Currently asymptomatic, no ischemia by most recent nuclear stress test, last revascularization procedure 2015. 3. NSVT: no episodes detected since his last device check 4. HTN: Blood pressure is relatively low 5. HLP: Lipid profile close to desirable target range. HDL remains low. Note potential for numerous drug interactions with his HIV medications. 6. ICD: normal device function. Implanted for primary prevention and has not required therapy delivery yet.    Medication Adjustments/Labs and Tests Ordered: Current medicines are reviewed at length with the patient today.  Concerns regarding medicines are outlined above.  Medication changes, Labs and Tests ordered today are listed in the Patient Instructions below. Patient Instructions  Dr Sallyanne Kuster recommends that you continue on your current medications as directed. Please refer to the Current Medication list given to you today.  Remote monitoring is used to monitor your Pacemaker of ICD from home. This monitoring reduces the number of office visits required to check your device to one time per year. It allows [place] to keep an eye on the functioning of your device  to ensure it is working properly. You are scheduled for a device check from home on Thursday, August 24th, 2017. You may send your transmission at any time that day. If you have a wireless device, the transmission will be sent automatically. After your physician reviews your transmission, you will receive a postcard with your next transmission date.   Dr Sallyanne Kuster recommends that you schedule a  follow-up appointment in 12 months with a defibrillator check. You will receive a reminder letter in the mail two months in advance. If you don't receive a letter, please call our office to schedule the follow-up appointment.  If you need a refill on your cardiac medications before your next appointment, please call your pharmacy.    Signed, Sanda Klein, MD  12/30/2015 9:35 AM    [facility] [address], [place], [place]  [address] Phone: [phone]; Fax: [phone]

## 2016-01-19 ENCOUNTER — Encounter: Payer: Self-pay | Admitting: Cardiovascular Disease

## 2016-01-22 ENCOUNTER — Ambulatory Visit (HOSPITAL_COMMUNITY)
Admission: EM | Admit: 2016-01-22 | Discharge: 2016-01-22 | Disposition: A | Discharge status: Home / Self Care | Payer: Medicare Other | Attending: Emergency Medicine | Admitting: Emergency Medicine

## 2016-01-22 ENCOUNTER — Encounter (HOSPITAL_COMMUNITY): Payer: Self-pay | Admitting: Emergency Medicine

## 2016-01-22 DIAGNOSIS — R143 Flatulence: Secondary | ICD-10-CM | POA: Diagnosis not present

## 2016-01-22 DIAGNOSIS — R1912 Hyperactive bowel sounds: Secondary | ICD-10-CM

## 2016-01-22 LAB — POCT H PYLORI SCREEN: H. PYLORI SCREEN, POC: NEGATIVE

## 2016-01-22 MED ORDER — METRONIDAZOLE 500 MG PO TABS: 500.0000 mg | ORAL_TABLET | Freq: Two times a day (BID) | ORAL | Status: DC

## 2016-01-22 MED ORDER — CEPHALEXIN 250 MG PO CAPS: 250.0000 mg | ORAL_CAPSULE | Freq: Four times a day (QID) | ORAL | Status: DC

## 2016-01-22 NOTE — Discharge Instructions (Signed)
Increased bowel sounds could be due to an overgrowth of bacteria. Take Flagyl twice a day for 1 week. Take Keflex 4 times a day for 1 week. Follow-up with your primary care doctor if this is not getting better.

## 2016-01-22 NOTE — ED Provider Notes (Signed)
CSN: [id number]     Arrival date & time 01/22/16  1709 History   First MD Initiated Contact with Patient 01/22/16 1742     Chief Complaint  Patient presents with  . Abdominal Pain   (Consider location/radiation/quality/duration/timing/severity/associated sxs/prior Treatment) HPI  He is a 60 year old man here for evaluation of abdominal issues. He states for the last several months he has had increased gurgling in his stomach. This is associated with increased burping and flatulence. He states it seems to be getting louder as time goes on. Yesterday, he did have a little bit of abdominal discomfort with it. The pain was located umbilically. The gurgling does get worse after eating. He denies any nausea or vomiting. No diarrhea or constipation. No fevers. He has been taking Carafate and Protonix without improvement. He has also tried over-the-counter simethicone without improvement.  Past Medical History  Diagnosis Date  . HIV (human immunodeficiency virus infection) ([place])   . Hypertension   . Coronary artery disease     a. cath 04/02/2014 3v dx 80-90% in D1, 80-90% mid LAD, 95% apical LAD, 95% distal inferior LV branch of LCx, total occlusion of prox RCA  b). Cath 04/03/2014 DES to mid LAD, DES x3 to prox D1, DAPT indefinitely. LCx lesion untreated  . Stroke ([place])   . Ischemic cardiomyopathy 2015    EF 20-25 percent by echo in 04/2014, s/p [address]. Jude [place] model [id number] serial number [id number]   Past Surgical History  Procedure Laterality Date  . Cardiac catheterization  04/02/2014    DR KELLY  . Left and right heart catheterization with coronary angiogram N/A 04/02/2014    Procedure: LEFT AND RIGHT HEART CATHETERIZATION WITH CORONARY ANGIOGRAM;  Surgeon: Troy Sine, MD;  Location: [facility] CATH LAB;  Service: Cardiovascular;  Laterality: N/A;  . Percutaneous coronary stent intervention (pci-s) N/A 04/03/2014    Procedure: PERCUTANEOUS CORONARY STENT INTERVENTION (PCI-S);  Surgeon: Peter  M Martinique, MD;  Location: [facility] CATH LAB;  Service: Cardiovascular;  Laterality: N/A;  . Implantable cardioverter defibrillator implant N/A 09/10/2014    Procedure: IMPLANTABLE CARDIOVERTER DEFIBRILLATOR IMPLANT;  Surgeon: Sanda Klein, MD;  [place] [phone] serial number [id number]  . Medial collateral ligament repair, knee Right 08/25/2015    Procedure: RIGHT THUMB RADICAL COLLATERAL LIGAMENT REPAIR;  Surgeon: Charlotte Crumb, MD;  Location: [facility];  Service: Orthopedics;  Laterality: Right;   Family History  Problem Relation Age of Onset  . Heart disease Mother    Social History  Substance Use Topics  . Smoking status: Never Smoker   . Smokeless tobacco: Never Used  . Alcohol Use: No    Review of Systems As in history of present illness Allergies  Ioxaglate and Ivp dye  Home Medications   Prior to Admission medications   Medication Sig Start Date End Date Taking? Authorizing Provider  aspirin 81 MG chewable tablet Chew 81 mg by mouth daily.    Historical Provider, MD  atorvastatin (LIPITOR) 20 MG tablet Take 20 mg by mouth daily.    Historical Provider, MD  cephALEXin (KEFLEX) 250 MG capsule Take 1 capsule (250 mg total) by mouth 4 (four) times daily. 01/22/16   Melony Overly, MD  clopidogrel (PLAVIX) 75 MG tablet Take 1 tablet (75 mg total) by mouth daily with breakfast. 04/04/14   Almyra Deforest, PA  clotrimazole (LOTRIMIN) 1 % cream Apply 1 application topically as needed (For itching of foot).    Historical Provider, MD  Cobicistat 150 MG  TABS Take 150 mg by mouth daily.    Historical Provider, MD  Darunavir Ethanolate (PREZISTA) 800 MG tablet Take 800 mg by mouth daily with breakfast.    Historical Provider, MD  diphenhydrAMINE (BENADRYL) 25 MG tablet Take 25 mg by mouth 2 (two) times daily.    Historical Provider, MD  dolutegravir (TIVICAY) 50 MG tablet Take 50 mg by mouth daily.    Historical Provider, MD  ezetimibe (ZETIA) 10 MG tablet Take 5 mg by mouth daily.      Historical Provider, MD  furosemide (LASIX) 20 MG tablet Take 1 tablet (20 mg total) by mouth daily as needed. 06/25/14   Troy Sine, MD  ipratropium (ATROVENT) 0.06 % nasal spray Place 2 sprays into both nostrils 4 (four) times daily as needed for rhinitis. 10/05/13   Audelia Hives Presson, PA  isosorbide mononitrate (IMDUR) 30 MG 24 hr tablet Take 0.5 tablets (15 mg total) by mouth daily. 03/24/15   Troy Sine, MD  lisinopril (PRINIVIL,ZESTRIL) 2.5 MG tablet Take 1 tablet (2.5 mg total) by mouth at bedtime. 01/09/15   Troy Sine, MD  loratadine (CLARITIN) 10 MG tablet Take 10 mg by mouth daily as needed for allergies.    Historical Provider, MD  metoprolol (LOPRESSOR) 50 MG tablet Take 1/2 tablet twice a day. 09/16/15   Troy Sine, MD  metroNIDAZOLE (FLAGYL) 500 MG tablet Take 1 tablet (500 mg total) by mouth 2 (two) times daily. 01/22/16   Melony Overly, MD  Omega-3 Fatty Acids (FISH OIL) 1000 MG CAPS Take 1,000 mg by mouth daily.    Historical Provider, MD  oxyCODONE-acetaminophen (ROXICET) 5-325 MG tablet Take 1 tablet by mouth every 4 (four) hours as needed for severe pain. 08/25/15   Charlotte Crumb, MD  pantoprazole (PROTONIX) 40 MG tablet Take 1 tablet (40 mg total) by mouth daily as needed. 04/04/14   Almyra Deforest, PA  Polyethyl Glycol-Propyl Glycol (SYSTANE ULTRA OP) Apply 1 drop to eye daily.    Historical Provider, MD  risperiDONE (RISPERDAL) 2 MG tablet Take 2 mg by mouth daily.     Historical Provider, MD  ritonavir (NORVIR) 100 MG TABS tablet Take 1 mg by mouth daily with breakfast.    Historical Provider, MD  sucralfate (CARAFATE) 1 GM/10ML suspension Take 1 g by mouth 2 (two) times daily.    Historical Provider, MD  tamsulosin (FLOMAX) 0.4 MG CAPS capsule Take 1 capsule (0.4 mg total) by mouth daily. 04/21/15   Leone Brand, MD   Meds Ordered and Administered this Visit  Medications - No data to display  BP 118/79 mmHg  Pulse 72  Temp(Src) 98.5 F (36.9 C) (Oral)  Resp  18  SpO2 100% No data found.   Physical Exam  Constitutional: He is oriented to person, place, and time. He appears well-developed and well-nourished. No distress.  Cardiovascular: Normal rate, regular rhythm and normal heart sounds.   No murmur heard. Pulmonary/Chest: Effort normal and breath sounds normal. No respiratory distress. He has no wheezes. He has no rales.  Abdominal: Soft. He exhibits no distension and no mass. There is tenderness (mild just to the right of the umbilicus). There is no rebound and no guarding.  Bowel sounds are little hyperactive  Neurological: He is alert and oriented to person, place, and time.    ED Course  Procedures (including critical care time)  Labs Review Labs Reviewed  POCT H PYLORI SCREEN    Imaging Review No results  found.    MDM   1. Increased bowel sounds   2. Flatulence    H. pylori negative. He had a colonoscopy 2 years ago and was told to follow-up in 5 years. When he thinks back, this may have started after drinking water out of the bottle that tasted like gas. Apparently you can get increased bowel sounds flatulence from overgrowth of intestinal bacteria. We'll treat with metronidazole and Keflex to see if this helps. Continue his probiotic. Follow-up as needed.   Melony Overly, MD 01/22/16 1901

## 2016-01-22 NOTE — ED Notes (Signed)
PT reports that for the last few months after eating and drinking his stomach hurts and "gurgles". PT reports when the pain occurs, it is over the center of his abdomen. PT denies N/V/D. PT is taking sucralfate and pantoprazole with no relief.

## 2016-03-22 ENCOUNTER — Other Ambulatory Visit: Payer: Self-pay

## 2016-03-22 ENCOUNTER — Ambulatory Visit (HOSPITAL_COMMUNITY): Payer: Medicare Other | Attending: Cardiovascular Disease

## 2016-03-22 DIAGNOSIS — I2510 Atherosclerotic heart disease of native coronary artery without angina pectoris: Secondary | ICD-10-CM | POA: Insufficient documentation

## 2016-03-22 DIAGNOSIS — Z8249 Family history of ischemic heart disease and other diseases of the circulatory system: Secondary | ICD-10-CM | POA: Insufficient documentation

## 2016-03-22 DIAGNOSIS — I255 Ischemic cardiomyopathy: Secondary | ICD-10-CM | POA: Diagnosis not present

## 2016-03-22 DIAGNOSIS — I340 Nonrheumatic mitral (valve) insufficiency: Secondary | ICD-10-CM | POA: Insufficient documentation

## 2016-03-22 DIAGNOSIS — I119 Hypertensive heart disease without heart failure: Secondary | ICD-10-CM | POA: Diagnosis not present

## 2016-03-22 DIAGNOSIS — Z95810 Presence of automatic (implantable) cardiac defibrillator: Secondary | ICD-10-CM | POA: Insufficient documentation

## 2016-03-30 ENCOUNTER — Ambulatory Visit (INDEPENDENT_AMBULATORY_CARE_PROVIDER_SITE_OTHER): Payer: Medicare Other | Admitting: *Deleted

## 2016-03-30 DIAGNOSIS — I255 Ischemic cardiomyopathy: Secondary | ICD-10-CM

## 2016-03-30 NOTE — Progress Notes (Signed)
Remote ICD transmission.   

## 2016-04-02 ENCOUNTER — Encounter: Payer: Self-pay | Admitting: Cardiology

## 2016-04-09 LAB — CUP PACEART REMOTE DEVICE CHECK
Battery Remaining Longevity: 94 mo
Battery Remaining Percentage: 84 %
Date Time Interrogation Session: 20170822065640
HIGH POWER IMPEDANCE MEASURED VALUE: 71 Ohm
HighPow Impedance: 71 Ohm
Lead Channel Impedance Value: 390 Ohm
Lead Channel Pacing Threshold Amplitude: 0.75 V
Lead Channel Setting Pacing Amplitude: 2.5 V
Lead Channel Setting Sensing Sensitivity: 0.5 mV
MDC IDC LEAD IMPLANT DT: 20160202
MDC IDC LEAD LOCATION: 753860
MDC IDC MSMT BATTERY VOLTAGE: 3.04 V
MDC IDC MSMT LEADCHNL RV PACING THRESHOLD PULSEWIDTH: 0.5 ms
MDC IDC MSMT LEADCHNL RV SENSING INTR AMPL: 10 mV
MDC IDC PG SERIAL: 7229147
MDC IDC SET LEADCHNL RV PACING PULSEWIDTH: 0.5 ms
MDC IDC STAT BRADY RV PERCENT PACED: 1 %

## 2016-05-05 ENCOUNTER — Ambulatory Visit (INDEPENDENT_AMBULATORY_CARE_PROVIDER_SITE_OTHER): Payer: Medicare Other | Admitting: Cardiovascular Disease

## 2016-05-05 ENCOUNTER — Telehealth: Payer: Self-pay | Admitting: *Deleted

## 2016-05-05 ENCOUNTER — Encounter: Payer: Self-pay | Admitting: Cardiovascular Disease

## 2016-05-05 VITALS — BP 100/66 | HR 73 | Ht 69.5 in | Wt 198.0 lb

## 2016-05-05 DIAGNOSIS — I255 Ischemic cardiomyopathy: Secondary | ICD-10-CM

## 2016-05-05 DIAGNOSIS — B20 Human immunodeficiency virus [HIV] disease: Secondary | ICD-10-CM | POA: Diagnosis not present

## 2016-05-05 DIAGNOSIS — I472 Ventricular tachycardia: Secondary | ICD-10-CM | POA: Diagnosis not present

## 2016-05-05 DIAGNOSIS — I2510 Atherosclerotic heart disease of native coronary artery without angina pectoris: Secondary | ICD-10-CM

## 2016-05-05 DIAGNOSIS — E785 Hyperlipidemia, unspecified: Secondary | ICD-10-CM

## 2016-05-05 DIAGNOSIS — I4729 Other ventricular tachycardia: Secondary | ICD-10-CM

## 2016-05-05 NOTE — Patient Instructions (Addendum)
Medication Instructions:  Continue current medications  Labwork: None Ordered  Testing/Procedures: None Ordered  Follow-Up: Your physician wants you to follow-up in: 6 Months. You will receive a reminder letter in the mail two months in advance. If you don't receive a letter, please call our office to schedule the follow-up appointment.   Any Other Special Instructions Will Be Listed Below (If Applicable).  Dr Claiborne Billings have cleared you for surgery, clearance will be faxed to Department of [facility].  If you need a refill on your cardiac medications before your next appointment, please call your pharmacy.

## 2016-05-05 NOTE — Telephone Encounter (Signed)
Clearance faxed to Department of [facility] to hold plavix 5 days prior to procedure.

## 2016-05-06 NOTE — Progress Notes (Signed)
Patient ID: Richard [name], male   DOB: Sep 15, 1955, 60 y.o.   MRN: [id number]    HPI:  Richard [name] is a 60 y.o. male who presents for a 9 month follow-up cardiology evaluation.   Richard [name] has a history of a CVA in 2003 and has been followed at the [facility] hospital.   In 2003 he underwent cardiac catheterization which  which did not reveal significant coronary artery disease. Prior to catheterization  ejection fraction was 33% with multiple areas of scar noted on a nuclear perfusion study.   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  He states that for the past year 5 years he has had undetected virus.  He has a history of hypertension, as well as hyperlipidemia.  There is also a history of depression, for which he takes Risperdal.  I initially saw him with complaints of some episodes of left-sided chest pain with walking, associated with left arm numbness and increasing shortness of breath with activity and is referred for nuclear study. He underwent a recent nuclear perfusion study on 03/01/2014.  Ejection fraction was about 27%.  This showed extensive scar multiple coronary territories without definitive reversible ischemia.  An echo Doppler study  on 03/01/2014 showed an ejection fraction of 20-25% with severe global hypokinesis, but more pronounced inferior hypokinesis.  He underwent cardiac catheterization by me on 04/02/2014 and was found to have severe  cardiomyopathy with ejection fraction of approximately 15%.  He had multivessel CAD with 80 and 90% stenoses in the first diagonal branch of the LAD, 80-90% LAD stenoses after the first septal perforating artery, and 95% apical LAD stenosis.  There is a 95% distal inferior LV branch stenosis of the distal circumflex and the RCA was totally occluded proximally with left to right collaterals.  He had normal right heart pressures and there was no evidence for pulmonary  hypertension.  He underwent surgical evaluation and was turned down.  The following day he underwent successful intervention to his diagonal and LAD by Dr. Martinique with insertion of a 3.0x38 mm Promus DES stent in the mid LAD and 3 stents placed in the diagonal vessel.  I referred him to Dr. Sallyanne Kuster for implantation of a cardio defibrillator and a single-chamber ICD was inserted on 09/10/2014.   He saw Dr. Recardo Evangelist on 12/31/2014 for follow-up evaluation. Interrogation revealed NSVT but without delivered therapy to date. He is participating in [place] remote follow-up.  He is evaluated at the [facility] every 3-4 months for his HIV.  He is unaware of palpitations.  He denies presyncope or syncope.  He underwent right thumb surgery andtolerated this well from a cardiac standpoint.  He will be undergoing GI evaluation at the [facility].  He is unaware of any palpitations.  He denies any defibrillator discharge.  He last saw Dr. Sallyanne Kuster in May 2017 for his defibrillator check and was well compensated with reference to his CHF, abnormal ICD device function and has not required therapy delivery.  He presents for follow-up evaluation.  Past Medical History:  Diagnosis Date  . Coronary artery disease    a. cath 04/02/2014 3v dx 80-90% in D1, 80-90% mid LAD, 95% apical LAD, 95% distal inferior LV branch of LCx, total occlusion of prox RCA  b). Cath 04/03/2014 DES to mid LAD, DES x3 to prox D1, DAPT indefinitely. LCx lesion untreated  . HIV (human immunodeficiency virus infection) ([place])   .  Hypertension   . Ischemic cardiomyopathy 2015   EF 20-25 percent by echo in 04/2014, s/p [employer] Jude Fortify Assura model [id number] serial number [id number]  . Stroke [facility])     Past Surgical History:  Procedure Laterality Date  . CARDIAC CATHETERIZATION  04/02/2014   DR Claiborne Billings  . IMPLANTABLE CARDIOVERTER DEFIBRILLATOR IMPLANT N/A 09/10/2014   Procedure: IMPLANTABLE CARDIOVERTER DEFIBRILLATOR IMPLANT;  Surgeon: Sanda Klein,  MD;  [facility] [place] model [id number] serial number [id number]  . LEFT AND RIGHT HEART CATHETERIZATION WITH CORONARY ANGIOGRAM N/A 04/02/2014   Procedure: LEFT AND RIGHT HEART CATHETERIZATION WITH CORONARY ANGIOGRAM;  Surgeon: Troy Sine, MD;  Location: [facility] CATH LAB;  Service: Cardiovascular;  Laterality: N/A;  . MEDIAL COLLATERAL LIGAMENT REPAIR, KNEE Right 08/25/2015   Procedure: RIGHT THUMB RADICAL COLLATERAL LIGAMENT REPAIR;  Surgeon: Charlotte Crumb, MD;  Location: [place];  Service: Orthopedics;  Laterality: Right;  . PERCUTANEOUS CORONARY STENT INTERVENTION (PCI-S) N/A 04/03/2014   Procedure: PERCUTANEOUS CORONARY STENT INTERVENTION (PCI-S);  Surgeon: Peter M Martinique, MD;  Location: [facility] CATH LAB;  Service: Cardiovascular;  Laterality: N/A;    Allergies  Allergen Reactions  . Ioxaglate Hives  . Ivp Dye [Iodinated Diagnostic Agents] Hives    Current Outpatient Prescriptions  Medication Sig Dispense Refill  . aspirin 81 MG chewable tablet Chew 81 mg by mouth daily.    [name] atorvastatin (LIPITOR) 20 MG tablet Take 20 mg by mouth daily.    . cephALEXin (KEFLEX) 250 MG capsule Take 1 capsule (250 mg total) by mouth 4 (four) times daily. 28 capsule 0  . clopidogrel (PLAVIX) 75 MG tablet Take 1 tablet (75 mg total) by mouth daily with breakfast. 30 tablet 0  . clotrimazole (LOTRIMIN) 1 % cream Apply 1 application topically as needed (For itching of foot).    . Cobicistat 150 MG TABS Take 150 mg by mouth daily.    . Darunavir Ethanolate (PREZISTA) 800 MG tablet Take 800 mg by mouth daily with breakfast.    . diphenhydrAMINE (BENADRYL) 25 MG tablet Take 25 mg by mouth 2 (two) times daily.    . dolutegravir (TIVICAY) 50 MG tablet Take 50 mg by mouth daily.    [name] ezetimibe (ZETIA) 10 MG tablet Take 5 mg by mouth daily.     . furosemide (LASIX) 20 MG tablet Take 1 tablet (20 mg total) by mouth daily as needed. 30 tablet 3  . ipratropium (ATROVENT) 0.06 % nasal spray Place 2 sprays into both  nostrils 4 (four) times daily as needed for rhinitis.    [name] isosorbide mononitrate (IMDUR) 30 MG 24 hr tablet Take 0.5 tablets (15 mg total) by mouth daily. 30 tablet 6  . lisinopril (PRINIVIL,ZESTRIL) 2.5 MG tablet Take 1 tablet (2.5 mg total) by mouth at bedtime. 30 tablet 6  . loratadine (CLARITIN) 10 MG tablet Take 10 mg by mouth daily as needed for allergies.    . metoprolol (LOPRESSOR) 50 MG tablet Take 1/2 tablet twice a day. 90 tablet 3  . metroNIDAZOLE (FLAGYL) 500 MG tablet Take 1 tablet (500 mg total) by mouth 2 (two) times daily. 14 tablet 0  . Omega-3 Fatty Acids (FISH OIL) 1000 MG CAPS Take 1,000 mg by mouth daily.    [name] oxyCODONE-acetaminophen (ROXICET) 5-325 MG tablet Take 1 tablet by mouth every 4 (four) hours as needed for severe pain. 30 tablet 0  . pantoprazole (PROTONIX) 40 MG tablet Take 1 tablet (40 mg total) by mouth daily as needed. 30 tablet 0  .  Polyethyl Glycol-Propyl Glycol (SYSTANE ULTRA OP) Apply 1 drop to eye daily.    . risperiDONE (RISPERDAL) 2 MG tablet Take 2 mg by mouth daily.     . ritonavir (NORVIR) 100 MG TABS tablet Take 1 mg by mouth daily with breakfast.    . sucralfate (CARAFATE) 1 GM/10ML suspension Take 1 g by mouth 2 (two) times daily.    . tamsulosin (FLOMAX) 0.4 MG CAPS capsule Take 1 capsule (0.4 mg total) by mouth daily. 30 capsule 0   No current facility-administered medications for this visit.     Socially, he is married for 25 years.  Has one child.  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  He completed 12th grade education.  There is no tobacco use.  He does drink alcohol.  Family History  Problem Relation Age of Onset  . Heart disease Mother     ROS General: Negative; No fevers, chills, or night sweats HEENT: Negative; No changes in vision or hearing, sinus congestion, difficulty swallowing Pulmonary: Negative; No cough, wheezing, shortness of breath, hemoptysis Cardiovascular:  See HPI; No chest pain, presyncope,  syncope, palpitations, edema GI: Negative; No nausea, vomiting, diarrhea, or abdominal pain GU: Negative; No dysuria, hematuria, or difficulty voiding Musculoskeletal: Negative; no myalgias, joint pain, or weakness Hematologic/Oncologic: Positive for HIV well-controlled on chronic therapy for years; no easy bruising, bleeding Endocrine: Negative; no heat/cold intolerance; no diabetes Neuro: Negative; no changes in balance, headaches Skin: Negative; No rashes or skin lesions Psychiatric: History of depression. No behavioral problems,  Sleep: Negative; No daytime sleepiness, hypersomnolence, bruxism, restless legs, hypnogagnic hallucinations Other comprehensive 14 point system review is negative   Physical Exam BP 100/66 (BP Location: Right Arm, Patient Position: Sitting, Cuff Size: Normal)   Pulse 73   Ht 5' 9.5" (1.765 m)   Wt 198 lb (89.8 kg)   SpO2 98%   BMI 28.82 kg/m    Repeat blood pressure by me was 100/60 supine and 90/55  standing.  Pulse was regular in the 60s without significant orthostatic pulse rise.  Wt Readings from Last 3 Encounters:  05/05/16 198 lb (89.8 kg)  12/30/15 189 lb 9.6 oz (86 kg)  09/16/15 189 lb (85.7 kg)   General: Alert, oriented, no distress.  Skin: normal turgor, no rashes, warm and dry HEENT: Normocephalic, atraumatic. Pupils equal round and reactive to light; sclera anicteric; extraocular muscles intact; Fundi poorly visualized, but without hemorrhages or exudates. Nose without nasal septal hypertrophy Mouth/Parynx benign; Mallinpatti scale 2/3 Neck: No JVD, no carotid bruits; normal carotid upstroke Lungs: clear to ausculatation and percussion; no wheezing or rales Chest wall: without tenderness to palpitation; no ecchymosis or induration at the ICD implantation site. Heart: PMI not displaced, RRR, s1 s2 normal, 1/6 systolic murmur, no diastolic murmur, no rubs, gallops, thrills, or heaves Abdomen: soft, nontender; no hepatosplenomehaly, BS+;  abdominal aorta nontender and not dilated by palpation. Rectal exam performed by me: Stool guaiac-negative.  Prostate without nodules. Back: no CVA tenderness Pulses 2+ Musculoskeletal: full range of motion, normal strength, no joint deformities Extremities: no clubbing cyanosis or edema, Homan's sign negative  Neurologic: grossly nonfocal; Cranial nerves grossly wnl Psychologic: Normal mood and affect  ECG (independently read by me): Normal sinus rhythm at 73 bpm with occasional PVC.  Right superior axis.  QTc interval 456 ms  February 2017 ECG (independently read by me):  Normal sinus rhythm at 61 bpm.  No ectopy.  Normal intervals.  QTC 404 ms.  August 2016ECG (independently read by me): Sinus rhythm at 67 bpm.  Isolated PVC.  Right superior axis.  Anterolateral T-wave changes.  June 2016 ECG (independently read by me):  Normal sinus rhythm at 65 bpm with an occasional PVC with VA conduction  February 2016ECG (independently read by me): Normal sinus rhythm at 63 bpm.  Possible old inferior infarct.  Lateral T-wave changes.  Right superior axis  November 2015 ECG (independently read by me):  Normal sinus rhythm.  Probable old IMI.  T-wave changes V3 through V6.  ECG (independently read by me): normal sinus rhythm at 64 beats per minute.  T-wave inversion V3 through V6,  lead 1, 2 and aVF.  Prior 02/20/2014 ECG (independently read by me): Sinus rhythm with frequent PVCs with transient trigeminy, and evidence for VA conduction with his ectopy.  LABS:  BMP Latest Ref Rng & Units 09/05/2015 08/25/2015 09/25/2014  Glucose 65 - 99 mg/dL 90 85 80  BUN 7 - 25 mg/dL 16 13 16   Creatinine 0.70 - 1.33 mg/dL 1.49(H) 1.46(H) 1.14  Sodium 135 - 146 mmol/L 135 141 139  Potassium 3.5 - 5.3 mmol/L 4.5 4.4 4.2  Chloride 98 - 110 mmol/L 101 110 105  CO2 20 - 31 mmol/L 27 21(L) 22  Calcium 8.6 - 10.3 mg/dL 9.2 9.1 9.3   Hepatic Function Latest Ref Rng & Units 09/05/2015 09/25/2014 09/04/2014  Total  Protein 6.1 - 8.1 g/dL 7.9 7.5 7.1  Albumin 3.6 - 5.1 g/dL 4.3 4.3 4.2  AST 10 - 35 U/L 19 19 18   ALT 9 - 46 U/L 20 23 22   Alk Phosphatase 40 - 115 U/L 76 70 61  Total Bilirubin 0.2 - 1.2 mg/dL 0.8 0.7 0.8   CBC Latest Ref Rng & Units 08/25/2015 09/04/2014 04/23/2014  WBC 4.0 - 10.5 K/uL 4.5 3.1(L) 3.5(L)  Hemoglobin 13.0 - 17.0 g/dL 14.9 13.7 13.6  Hematocrit 39.0 - 52.0 % 42.8 41.6 39.4  Platelets 150 - 400 K/uL 108(L) 141(L) 148(L)   Lab Results  Component Value Date   MCV 79.7 08/25/2015   MCV 80.9 09/04/2014   MCV 78.3 04/23/2014   Lab Results  Component Value Date   TSH 0.917 09/25/2014   Lipid Panel     Component Value Date/Time   CHOL 125 09/05/2015 0848   TRIG 86 09/05/2015 0848   HDL 32 (L) 09/05/2015 0848   CHOLHDL 3.9 09/05/2015 0848   VLDL 17 09/05/2015 0848   LDLCALC 76 09/05/2015 0848    RADIOLOGY: Dg Chest 2 View  02/04/2014   CLINICAL DATA:  Chest and left arm pain.  EXAM: CHEST  2 VIEW  COMPARISON:  None.  FINDINGS: The lungs are clear. Heart size is normal. No pneumothorax or pleural effusion. No focal bony abnormality.  IMPRESSION: Negative chest.   Electronically Signed   By: Inge Rise M.D.   On: 02/04/2014 10:53     ASSESSMENT AND PLAN: Richard [name] is a 60 year old, African American male who suffered a CVA in 2003.   In 2003  he was documented to have a cardiomyopathy   He developed new chest tightness and increasing shortness of breath which led to cardiac catheterization in August 2015.  His ejection fraction was 15% and he underwent successful intervention to a diffusely diseased diagonal and LAD system.  His RCA is occluded and is collateralized.  He does have very distal circumflex stenosis.  He has not had any recurrent anginal symptoms since his intervention.  Following revascularization  LV function remained low and  he underwent prophylactic ICD implantation in light of increased sudden cardiac death risk.  He has not had any  defibrillator shocks, but has been noted to have some brief episodes of NSVT.  His blood pressure has remained low and he is on a medical regimen consisting of only Isopril 2.5 mg and isosorbide 15 mg.  He continues to be on dual antiplatelet therapy with aspirin and Plavix and is without bleeding.  He recently completed an echo Doppler study in August 2017.  Ejection fraction is 30-35% and there was grade 2 diastolic dysfunction.  There was mild MR.  His right ventricular function was moderately reduced.  He is asymptomatic and his gradual improvement in LV function.  I have suggested that if he notes dizziness.  He can hold his isosorbide mononitrate.  I have given him clearance to undergo his GI procedure and hold Plavix 5 days prior to the procedure.  He has hyperlipidemia which is controlled with combination atorvastatin 20 mg and Zetia 10 mg.  As long as he remains stable, I will see him in 6 months for cardiology reevaluation.  Time spent: 25 minutes  Troy Sine, MD, [facility] 05/06/2016 9:58 PM

## 2016-06-23 ENCOUNTER — Telehealth: Payer: Self-pay | Admitting: Cardiovascular Disease

## 2016-06-23 NOTE — Telephone Encounter (Signed)
Dr. Claiborne Billings has seen in office recently. Will route for review on this medication and any change in instructions of use.

## 2016-06-23 NOTE — Telephone Encounter (Signed)
Pt went to [place] and was told he has been on Plavix for 2 years and normally patients are on 18 months, wants to know why he is still on and when he can get off? pls call

## 2016-06-29 ENCOUNTER — Ambulatory Visit (INDEPENDENT_AMBULATORY_CARE_PROVIDER_SITE_OTHER): Payer: Medicare Other | Admitting: *Deleted

## 2016-06-29 DIAGNOSIS — I255 Ischemic cardiomyopathy: Secondary | ICD-10-CM

## 2016-06-29 NOTE — Progress Notes (Signed)
Remote ICD transmission.   

## 2016-07-05 NOTE — Telephone Encounter (Signed)
Severe multvessel CAD and reduced LV fxn. Long term DAPT data suggests benefit with continued therapy.

## 2016-07-05 NOTE — Telephone Encounter (Signed)
Recommendations communicated to patient, who voiced understanding and thanks. 

## 2016-07-08 ENCOUNTER — Encounter: Payer: Self-pay | Admitting: Cardiology

## 2016-07-13 ENCOUNTER — Encounter (HOSPITAL_COMMUNITY): Payer: Self-pay | Admitting: Emergency Medicine

## 2016-07-13 ENCOUNTER — Ambulatory Visit (HOSPITAL_COMMUNITY)
Admission: EM | Admit: 2016-07-13 | Discharge: 2016-07-13 | Disposition: A | Discharge status: Home / Self Care | Payer: Medicare Other | Attending: Family Medicine | Admitting: Family Medicine

## 2016-07-13 DIAGNOSIS — T148XXA Other injury of unspecified body region, initial encounter: Secondary | ICD-10-CM | POA: Diagnosis not present

## 2016-07-13 DIAGNOSIS — R51 Headache: Secondary | ICD-10-CM

## 2016-07-13 DIAGNOSIS — S161XXA Strain of muscle, fascia and tendon at neck level, initial encounter: Secondary | ICD-10-CM

## 2016-07-13 DIAGNOSIS — M25522 Pain in left elbow: Secondary | ICD-10-CM

## 2016-07-13 DIAGNOSIS — S39012A Strain of muscle, fascia and tendon of lower back, initial encounter: Secondary | ICD-10-CM

## 2016-07-13 DIAGNOSIS — R519 Headache, unspecified: Secondary | ICD-10-CM

## 2016-07-13 DIAGNOSIS — M25521 Pain in right elbow: Secondary | ICD-10-CM | POA: Diagnosis not present

## 2016-07-13 NOTE — Discharge Instructions (Signed)
It is likely you are continuing to have muscle soreness from the sleeping in the chair and raking the leaves. There were no specific abnormal findings of these areas other than soreness of muscles. Your neurologic exam is normal. Taking 2 Tylenol every 4-6 hours as needed for discomfort is acceptable. Apply heat to the neck and low back for your muscle soreness. If he continued to have elbow pain or headaches become worse and more persistent follow-up with your primary care provider.

## 2016-07-13 NOTE — ED Provider Notes (Signed)
CSN: [id number]     Arrival date & time 07/13/16  1002 History   First MD Initiated Contact with Patient 07/13/16 1031     Chief Complaint  Patient presents with  . Back Pain   (Consider location/radiation/quality/duration/timing/severity/associated sxs/prior Treatment) 60 year old male complaining of pain across the low back, bilateral elbow pain and pain in the right side of the neck for about one week. He states all of this occurred at approximately the same time and shortly after outside raking leaves. He states that 10 days ago he was sleeping in a chair, his head was slumped to one side and while sleeping he slopped down. He states this may have made the soreness in his back and neck worse. Other than that he denies trauma. Denies injury or blunt trauma to the head or neck. He also complains of a minor headache that is intermittent and located to the right temple. He states is not sore but sometimes affected with chewing and opening his mouth widely. Usually takes Tylenol and this helps her to go away. He denies focal paresthesias or weakness, problems with vision, speech or hearing.  His medical history includes CAD, HIV, hypertension, ischemic cardiomyopathy and CVA with uncertain response in regards to residual. He is able to do most things on his own.      Past Medical History:  Diagnosis Date  . Coronary artery disease    a. cath 04/02/2014 3v dx 80-90% in D1, 80-90% mid LAD, 95% apical LAD, 95% distal inferior LV branch of LCx, total occlusion of prox RCA  b). Cath 04/03/2014 DES to mid LAD, DES x3 to prox D1, DAPT indefinitely. LCx lesion untreated  . HIV (human immunodeficiency virus infection) ([place])   . Hypertension   . Ischemic cardiomyopathy 2015   EF 20-25 percent by echo in 04/2014, s/p St. Jude Fortify Assura model [id number] serial number [id number]  . Stroke [facility])    Past Surgical History:  Procedure Laterality Date  . CARDIAC CATHETERIZATION  04/02/2014   DR Claiborne Billings  .  IMPLANTABLE CARDIOVERTER DEFIBRILLATOR IMPLANT N/A 09/10/2014   Procedure: IMPLANTABLE CARDIOVERTER DEFIBRILLATOR IMPLANT;  Surgeon: Sanda Klein, MD;  [facility] [place] model [phone] serial number [id number]  . LEFT AND RIGHT HEART CATHETERIZATION WITH CORONARY ANGIOGRAM N/A 04/02/2014   Procedure: LEFT AND RIGHT HEART CATHETERIZATION WITH CORONARY ANGIOGRAM;  Surgeon: Troy Sine, MD;  Location: [facility] CATH LAB;  Service: Cardiovascular;  Laterality: N/A;  . MEDIAL COLLATERAL LIGAMENT REPAIR, KNEE Right 08/25/2015   Procedure: RIGHT THUMB RADICAL COLLATERAL LIGAMENT REPAIR;  Surgeon: Charlotte Crumb, MD;  Location: [place];  Service: Orthopedics;  Laterality: Right;  . PERCUTANEOUS CORONARY STENT INTERVENTION (PCI-S) N/A 04/03/2014   Procedure: PERCUTANEOUS CORONARY STENT INTERVENTION (PCI-S);  Surgeon: Peter M Martinique, MD;  Location: [facility] CATH LAB;  Service: Cardiovascular;  Laterality: N/A;   Family History  Problem Relation Age of Onset  . Heart disease Mother    Social History  Substance Use Topics  . Smoking status: Never Smoker  . Smokeless tobacco: Never Used  . Alcohol use No    Review of Systems  Constitutional: Negative for activity change, diaphoresis, fatigue and fever.  HENT: Negative for congestion, ear pain, postnasal drip, sinus pressure, sore throat and trouble swallowing.   Eyes: Negative.   Respiratory: Negative.  Negative for cough and shortness of breath.   Cardiovascular: Negative for chest pain and leg swelling.  Gastrointestinal: Negative.   Genitourinary: Negative.   Musculoskeletal: Positive for arthralgias, myalgias and neck  pain. Negative for joint swelling.  Skin: Negative.   Neurological: Positive for headaches. Negative for dizziness, tremors, seizures, syncope, facial asymmetry, speech difficulty, weakness and light-headedness.  All other systems reviewed and are negative.   Allergies  Ioxaglate and Ivp dye [iodinated diagnostic agents]  Home  Medications   Prior to Admission medications   Medication Sig Start Date End Date Taking? Authorizing Provider  aspirin 81 MG chewable tablet Chew 81 mg by mouth daily.    Historical Provider, MD  atorvastatin (LIPITOR) 20 MG tablet Take 20 mg by mouth daily.    Historical Provider, MD  clopidogrel (PLAVIX) 75 MG tablet Take 1 tablet (75 mg total) by mouth daily with breakfast. 04/04/14   Almyra Deforest, PA  clotrimazole (LOTRIMIN) 1 % cream Apply 1 application topically as needed (For itching of foot).    Historical Provider, MD  Cobicistat 150 MG TABS Take 150 mg by mouth daily.    Historical Provider, MD  Darunavir Ethanolate (PREZISTA) 800 MG tablet Take 800 mg by mouth daily with breakfast.    Historical Provider, MD  diphenhydrAMINE (BENADRYL) 25 MG tablet Take 25 mg by mouth 2 (two) times daily.    Historical Provider, MD  dolutegravir (TIVICAY) 50 MG tablet Take 50 mg by mouth daily.    Historical Provider, MD  ezetimibe (ZETIA) 10 MG tablet Take 5 mg by mouth daily.     Historical Provider, MD  furosemide (LASIX) 20 MG tablet Take 1 tablet (20 mg total) by mouth daily as needed. 06/25/14   Troy Sine, MD  ipratropium (ATROVENT) 0.06 % nasal spray Place 2 sprays into both nostrils 4 (four) times daily as needed for rhinitis. 10/05/13   Audelia Hives Presson, PA  isosorbide mononitrate (IMDUR) 30 MG 24 hr tablet Take 0.5 tablets (15 mg total) by mouth daily. 03/24/15   Troy Sine, MD  lisinopril (PRINIVIL,ZESTRIL) 2.5 MG tablet Take 1 tablet (2.5 mg total) by mouth at bedtime. 01/09/15   Troy Sine, MD  loratadine (CLARITIN) 10 MG tablet Take 10 mg by mouth daily as needed for allergies.    Historical Provider, MD  metoprolol (LOPRESSOR) 50 MG tablet Take 1/2 tablet twice a day. 09/16/15   Troy Sine, MD  Omega-3 Fatty Acids (FISH OIL) 1000 MG CAPS Take 1,000 mg by mouth daily.    Historical Provider, MD  oxyCODONE-acetaminophen (ROXICET) 5-325 MG tablet Take 1 tablet by mouth every 4  (four) hours as needed for severe pain. 08/25/15   Charlotte Crumb, MD  pantoprazole (PROTONIX) 40 MG tablet Take 1 tablet (40 mg total) by mouth daily as needed. 04/04/14   Almyra Deforest, PA  Polyethyl Glycol-Propyl Glycol (SYSTANE ULTRA OP) Apply 1 drop to eye daily.    Historical Provider, MD  risperiDONE (RISPERDAL) 2 MG tablet Take 2 mg by mouth daily.     Historical Provider, MD  ritonavir (NORVIR) 100 MG TABS tablet Take 1 mg by mouth daily with breakfast.    Historical Provider, MD  sucralfate (CARAFATE) 1 GM/10ML suspension Take 1 g by mouth 2 (two) times daily.    Historical Provider, MD   Meds Ordered and Administered this Visit  Medications - No data to display  BP 116/76 (BP Location: Left Arm)   Pulse 69   Temp 97.9 F (36.6 C) (Oral)   Resp 18   SpO2 98%  No data found.   Physical Exam  Constitutional: He is oriented to person, place, and time. He appears well-developed  and well-nourished. No distress.  HENT:  Head: Normocephalic and atraumatic.  Mouth/Throat: Oropharynx is clear and moist. No oropharyngeal exudate.  Bilateral TMs are normal.  Eyes: EOM are normal.  Neck: Normal range of motion. Neck supple.  There is tenderness to the right splenius capitis and scalene muscle. The patient can flex forward fully however this will elicit "soreness" to the involved muscle.  no spinal tenderness, deformity or swelling. No step-off deformity. Examination of the elbows reveals full range of motion briskly flexion and extension, no swelling no tenderness, no erythema no overlying skin changes no increase in warmth no abnormalities observed or elicited. Low back tenderness to the para lumbar musculature. No lumbar spine tenderness, deformity, swelling or step-off deformity. Patient is able to flex forward beyond 90 without pain. Ambulation, gait is smooth balance.  Cardiovascular: Normal rate and regular rhythm.   Pulmonary/Chest: Effort normal.  Musculoskeletal: Normal range of  motion. He exhibits no edema or deformity.  Lymphadenopathy:    He has no cervical adenopathy.  Neurological: He is alert and oriented to person, place, and time. He has normal strength. No cranial nerve deficit or sensory deficit. GCS eye subscore is 4. GCS verbal subscore is 5. GCS motor subscore is 6.  Patient speech, vision, hearing, swallowing, movement, concentration and memory are intact. No trauma to the head. Mild tenderness to the lateral temple.  Skin: Skin is warm and dry.  Psychiatric: He has a normal mood and affect. His behavior is normal.  Nursing note and vitals reviewed.   Urgent Care Course   Clinical Course     Procedures (including critical care time)  Labs Review Labs Reviewed - No data to display  Imaging Review No results found.   Visual Acuity Review  Right Eye Distance:   Left Eye Distance:   Bilateral Distance:    Right Eye Near:   Left Eye Near:    Bilateral Near:         MDM   1. Strain of lumbar region, initial encounter   2. Muscle strain   3. Cervical strain, acute, initial encounter   4. Arthralgia of both elbows   5. Nonintractable episodic headache, unspecified headache type    It is likely you are continuing to have muscle soreness from the sleeping in the chair and raking the leaves. There were no specific abnormal findings of these areas other than soreness of muscles. Your neurologic exam is normal. Taking 2 Tylenol every 4-6 hours as needed for discomfort is acceptable. Apply heat to the neck and low back for your muscle soreness. If he continued to have elbow pain or headaches become worse and more persistent follow-up with your primary care provider.    Janne Napoleon, NP 07/13/16 1102

## 2016-07-13 NOTE — ED Triage Notes (Signed)
Patient has headache and lower back pain.  Patient noticed pain after sleeping in a chair.  Onset one week ago.  Patient also reports extremity joints hurting.  No known injury.  No fever.  Denies cough, cold or runny nose.

## 2016-07-21 LAB — CUP PACEART REMOTE DEVICE CHECK
HighPow Impedance: 72 Ohm
HighPow Impedance: 72 Ohm
Implantable Lead Location: 753860
Implantable Pulse Generator Implant Date: 20160202
Lead Channel Pacing Threshold Pulse Width: 0.5 ms
Lead Channel Setting Pacing Pulse Width: 0.5 ms
Lead Channel Setting Sensing Sensitivity: 0.5 mV
MDC IDC LEAD IMPLANT DT: 20160202
MDC IDC MSMT BATTERY REMAINING LONGEVITY: 91 mo
MDC IDC MSMT BATTERY REMAINING PERCENTAGE: 82 %
MDC IDC MSMT BATTERY VOLTAGE: 3.02 V
MDC IDC MSMT LEADCHNL RV IMPEDANCE VALUE: 360 Ohm
MDC IDC MSMT LEADCHNL RV PACING THRESHOLD AMPLITUDE: 0.75 V
MDC IDC MSMT LEADCHNL RV SENSING INTR AMPL: 10.8 mV
MDC IDC SESS DTM: 20171121092918
MDC IDC SET LEADCHNL RV PACING AMPLITUDE: 2.5 V
MDC IDC STAT BRADY RV PERCENT PACED: 1 %
Pulse Gen Serial Number: 7229147

## 2016-08-10 ENCOUNTER — Ambulatory Visit (HOSPITAL_COMMUNITY)
Admission: EM | Admit: 2016-08-10 | Discharge: 2016-08-10 | Disposition: A | Discharge status: Home / Self Care | Payer: Medicare Other | Attending: Family Medicine | Admitting: Family Medicine

## 2016-08-10 ENCOUNTER — Encounter (HOSPITAL_COMMUNITY): Payer: Self-pay | Admitting: Emergency Medicine

## 2016-08-10 DIAGNOSIS — M549 Dorsalgia, unspecified: Secondary | ICD-10-CM | POA: Insufficient documentation

## 2016-08-10 DIAGNOSIS — R300 Dysuria: Secondary | ICD-10-CM | POA: Diagnosis not present

## 2016-08-10 DIAGNOSIS — M545 Low back pain, unspecified: Secondary | ICD-10-CM

## 2016-08-10 DIAGNOSIS — R319 Hematuria, unspecified: Secondary | ICD-10-CM

## 2016-08-10 LAB — POCT URINALYSIS DIP (DEVICE)
BILIRUBIN URINE: NEGATIVE
Glucose, UA: NEGATIVE mg/dL
Ketones, ur: NEGATIVE mg/dL
Leukocytes, UA: NEGATIVE
NITRITE: NEGATIVE
PH: 5.5 (ref 5.0–8.0)
Protein, ur: NEGATIVE mg/dL
Specific Gravity, Urine: 1.01 (ref 1.005–1.030)
Urobilinogen, UA: 0.2 mg/dL (ref 0.0–1.0)

## 2016-08-10 MED ORDER — NAPROXEN 500 MG PO TABS: 500.0000 mg | ORAL_TABLET | Freq: Two times a day (BID) | ORAL | 0 refills | Status: DC

## 2016-08-10 MED ORDER — PHENAZOPYRIDINE HCL 200 MG PO TABS: 200.0000 mg | ORAL_TABLET | Freq: Three times a day (TID) | ORAL | 0 refills | Status: DC

## 2016-08-10 NOTE — ED Triage Notes (Signed)
Noted stinging with urination prior to new years day.  Today has left lower back burning, aching on right lower back and burning with urination

## 2016-08-10 NOTE — ED Provider Notes (Signed)
CSN: [id number]     Arrival date & time 08/10/16  1004 History   First MD Initiated Contact with Patient 08/10/16 1044     Chief Complaint  Patient presents with  . Back Pain   (Consider location/radiation/quality/duration/timing/severity/associated sxs/prior Treatment) Patient c/o back discomfort, dysuria, for 2 days.   The history is provided by the patient.  Back Pain  Location:  Lumbar spine Quality:  Aching Radiates to:  Does not radiate Pain severity:  Mild Pain is:  Same all the time Onset quality:  Sudden Duration:  2 days Timing:  Constant Progression:  Unchanged Chronicity:  New Relieved by:  None tried Worsened by:  Nothing Ineffective treatments:  None tried Associated symptoms: dysuria     Past Medical History:  Diagnosis Date  . Coronary artery disease    a. cath 04/02/2014 3v dx 80-90% in D1, 80-90% mid LAD, 95% apical LAD, 95% distal inferior LV branch of LCx, total occlusion of prox RCA  b). Cath 04/03/2014 DES to mid LAD, DES x3 to prox D1, DAPT indefinitely. LCx lesion untreated  . HIV (human immunodeficiency virus infection) ([place])   . Hypertension   . Ischemic cardiomyopathy 2015   EF 20-25 percent by echo in 04/2014, s/p St. Jude Fortify Assura model [id number] serial number [id number]  . Stroke [facility])    Past Surgical History:  Procedure Laterality Date  . CARDIAC CATHETERIZATION  04/02/2014   DR Claiborne Billings  . IMPLANTABLE CARDIOVERTER DEFIBRILLATOR IMPLANT N/A 09/10/2014   Procedure: IMPLANTABLE CARDIOVERTER DEFIBRILLATOR IMPLANT;  Surgeon: Sanda Klein, MD;  [facility] [place] model [id number] serial number [id number]  . LEFT AND RIGHT HEART CATHETERIZATION WITH CORONARY ANGIOGRAM N/A 04/02/2014   Procedure: LEFT AND RIGHT HEART CATHETERIZATION WITH CORONARY ANGIOGRAM;  Surgeon: Troy Sine, MD;  Location: [facility] CATH LAB;  Service: Cardiovascular;  Laterality: N/A;  . MEDIAL COLLATERAL LIGAMENT REPAIR, KNEE Right 08/25/2015   Procedure: RIGHT THUMB RADICAL  COLLATERAL LIGAMENT REPAIR;  Surgeon: Charlotte Crumb, MD;  Location: [place];  Service: Orthopedics;  Laterality: Right;  . PERCUTANEOUS CORONARY STENT INTERVENTION (PCI-S) N/A 04/03/2014   Procedure: PERCUTANEOUS CORONARY STENT INTERVENTION (PCI-S);  Surgeon: Peter M Martinique, MD;  Location: [facility] CATH LAB;  Service: Cardiovascular;  Laterality: N/A;   Family History  Problem Relation Age of Onset  . Heart disease Mother    Social History  Substance Use Topics  . Smoking status: Never Smoker  . Smokeless tobacco: Never Used  . Alcohol use No    Review of Systems  Constitutional: Negative.   HENT: Negative.   Eyes: Negative.   Respiratory: Negative.   Cardiovascular: Negative.   Gastrointestinal: Negative.   Endocrine: Negative.   Genitourinary: Positive for dysuria.  Musculoskeletal: Positive for back pain.  Allergic/Immunologic: Negative.   Neurological: Negative.   Hematological: Negative.   Psychiatric/Behavioral: Negative.     Allergies  Ioxaglate and Ivp dye [iodinated diagnostic agents]  Home Medications   Prior to Admission medications   Medication Sig Start Date End Date Taking? Authorizing Provider  aspirin 81 MG chewable tablet Chew 81 mg by mouth daily.    Historical Provider, MD  atorvastatin (LIPITOR) 20 MG tablet Take 20 mg by mouth daily.    Historical Provider, MD  clopidogrel (PLAVIX) 75 MG tablet Take 1 tablet (75 mg total) by mouth daily with breakfast. 04/04/14   Almyra Deforest, PA  clotrimazole (LOTRIMIN) 1 % cream Apply 1 application topically as needed (For itching of foot).    Historical Provider, MD  Cobicistat 150 MG TABS Take 150 mg by mouth daily.    Historical Provider, MD  Darunavir Ethanolate (PREZISTA) 800 MG tablet Take 800 mg by mouth daily with breakfast.    Historical Provider, MD  diphenhydrAMINE (BENADRYL) 25 MG tablet Take 25 mg by mouth 2 (two) times daily.    Historical Provider, MD  dolutegravir (TIVICAY) 50 MG tablet Take 50 mg by mouth  daily.    Historical Provider, MD  ezetimibe (ZETIA) 10 MG tablet Take 5 mg by mouth daily.     Historical Provider, MD  furosemide (LASIX) 20 MG tablet Take 1 tablet (20 mg total) by mouth daily as needed. 06/25/14   Troy Sine, MD  ipratropium (ATROVENT) 0.06 % nasal spray Place 2 sprays into both nostrils 4 (four) times daily as needed for rhinitis. 10/05/13   Audelia Hives Presson, PA  isosorbide mononitrate (IMDUR) 30 MG 24 hr tablet Take 0.5 tablets (15 mg total) by mouth daily. 03/24/15   Troy Sine, MD  lisinopril (PRINIVIL,ZESTRIL) 2.5 MG tablet Take 1 tablet (2.5 mg total) by mouth at bedtime. 01/09/15   Troy Sine, MD  loratadine (CLARITIN) 10 MG tablet Take 10 mg by mouth daily as needed for allergies.    Historical Provider, MD  metoprolol (LOPRESSOR) 50 MG tablet Take 1/2 tablet twice a day. 09/16/15   Troy Sine, MD  naproxen (NAPROSYN) 500 MG tablet Take 1 tablet (500 mg total) by mouth 2 (two) times daily with a meal. 08/10/16   Lysbeth Penner, FNP  Omega-3 Fatty Acids (FISH OIL) 1000 MG CAPS Take 1,000 mg by mouth daily.    Historical Provider, MD  oxyCODONE-acetaminophen (ROXICET) 5-325 MG tablet Take 1 tablet by mouth every 4 (four) hours as needed for severe pain. 08/25/15   Charlotte Crumb, MD  pantoprazole (PROTONIX) 40 MG tablet Take 1 tablet (40 mg total) by mouth daily as needed. 04/04/14   Almyra Deforest, PA  phenazopyridine (PYRIDIUM) 200 MG tablet Take 1 tablet (200 mg total) by mouth 3 (three) times daily. 08/10/16   Lysbeth Penner, FNP  Polyethyl Glycol-Propyl Glycol (SYSTANE ULTRA OP) Apply 1 drop to eye daily.    Historical Provider, MD  risperiDONE (RISPERDAL) 2 MG tablet Take 2 mg by mouth daily.     Historical Provider, MD  ritonavir (NORVIR) 100 MG TABS tablet Take 1 mg by mouth daily with breakfast.    Historical Provider, MD  sucralfate (CARAFATE) 1 GM/10ML suspension Take 1 g by mouth 2 (two) times daily.    Historical Provider, MD   Meds Ordered and  Administered this Visit  Medications - No data to display  BP 122/81 (BP Location: Left Arm)   Pulse 69   Temp 97.7 F (36.5 C) (Oral)   Resp 18   SpO2 100%  No data found.   Physical Exam  Constitutional: He appears well-developed and well-nourished.  HENT:  Head: Normocephalic and atraumatic.  Eyes: EOM are normal. Pupils are equal, round, and reactive to light.  Neck: Normal range of motion. Neck supple.  Cardiovascular: Normal rate, regular rhythm and normal heart sounds.   Pulmonary/Chest: Effort normal and breath sounds normal.  Abdominal: Soft. Bowel sounds are normal.  Nursing note and vitals reviewed.   Urgent Care Course   Clinical Course     Procedures (including critical care time)  Labs Review Labs Reviewed  POCT URINALYSIS DIP (DEVICE) - Abnormal; Notable for the following:       Result  Value   Hgb urine dipstick TRACE (*)    All other components within normal limits  URINE CULTURE  URINE CYTOLOGY ANCILLARY ONLY    Imaging Review No results found.   Visual Acuity Review  Right Eye Distance:   Left Eye Distance:   Bilateral Distance:    Right Eye Near:   Left Eye Near:    Bilateral Near:         MDM   1. Midline low back pain without sciatica, unspecified chronicity   2. Dysuria   3. Hematuria, unspecified type    Pyridium 200mg  one po tid x 2d #6 Naprosyn 500mg  one po bid x 7 days #14  Urine cytology  UA cx    Lysbeth Penner, FNP 08/10/16 1112

## 2016-08-11 LAB — URINE CYTOLOGY ANCILLARY ONLY
Chlamydia: NEGATIVE
Neisseria Gonorrhea: NEGATIVE
Trichomonas: NEGATIVE

## 2016-08-11 LAB — URINE CULTURE: Culture: <10000 — AB

## 2016-09-28 ENCOUNTER — Ambulatory Visit (INDEPENDENT_AMBULATORY_CARE_PROVIDER_SITE_OTHER): Payer: Medicare Other | Admitting: *Deleted

## 2016-09-28 DIAGNOSIS — I255 Ischemic cardiomyopathy: Secondary | ICD-10-CM | POA: Diagnosis not present

## 2016-09-28 NOTE — Progress Notes (Signed)
Remote ICD transmission.   

## 2016-09-29 ENCOUNTER — Encounter: Payer: Self-pay | Admitting: Cardiology

## 2016-09-29 LAB — CUP PACEART REMOTE DEVICE CHECK
Battery Remaining Longevity: 89 mo
Battery Remaining Percentage: 80 %
Battery Voltage: 3.02 V
Brady Statistic RV Percent Paced: 1 %
Date Time Interrogation Session: 20180220080330
HIGH POWER IMPEDANCE MEASURED VALUE: 68 Ohm
HighPow Impedance: 68 Ohm
Implantable Lead Implant Date: 20160202
Lead Channel Impedance Value: 390 Ohm
Lead Channel Pacing Threshold Amplitude: 0.75 V
Lead Channel Sensing Intrinsic Amplitude: 11.8 mV
Lead Channel Setting Pacing Amplitude: 2.5 V
Lead Channel Setting Sensing Sensitivity: 0.5 mV
MDC IDC LEAD LOCATION: 753860
MDC IDC MSMT LEADCHNL RV PACING THRESHOLD PULSEWIDTH: 0.5 ms
MDC IDC PG IMPLANT DT: 20160202
MDC IDC PG SERIAL: 7229147
MDC IDC SET LEADCHNL RV PACING PULSEWIDTH: 0.5 ms

## 2016-10-01 ENCOUNTER — Other Ambulatory Visit: Payer: Medicare Other

## 2016-10-01 ENCOUNTER — Telehealth: Payer: Self-pay | Admitting: *Deleted

## 2016-10-01 ENCOUNTER — Other Ambulatory Visit: Payer: Self-pay | Admitting: Cardiovascular Disease

## 2016-10-01 DIAGNOSIS — I4901 Ventricular fibrillation: Secondary | ICD-10-CM

## 2016-10-01 NOTE — Telephone Encounter (Signed)
Sanda Klein, MD  You; Dionne Bucy Truitt, CMA 40 minutes ago (9:43 AM)      Please check CME, MgT and bring in for first available appt. Tell him to take all his meds as prescribed. He should not drive.      Documentation    Mr. Mapson will come to the [facility] office today for blood work and I advised him that the [facility] office would call him to schedule f/u with Dr. Loletha Grayer. He verbalizes understanding.

## 2016-10-01 NOTE — Telephone Encounter (Signed)
Please check CME, MgT and bring in for first available appt. Tell him to take all his meds as prescribed. He should not drive.

## 2016-10-01 NOTE — Telephone Encounter (Signed)
Richard [name] calling because he heard an alert from his device this morning about 3 am.   Reviewed [place] alert- HV therapy for VF at 11:16pm 09/30/16. He reports that he heard a "loud boom" at that time that woke he and his wife up. They were in bed going to sleep. He denies having SOB, dizziness, CP at any time.  He reports that he ran out of metoprolol 3-4 days ago but has since had it refilled and restarted it yesterday. I advised him to continue his medications as prescribed and made him aware of [place] driving restrictions x 6 months. This distressed him.   I will route this info to Dr. Sallyanne Kuster for recommendations. Richard [name] says that he will be at this number today for me to call him back.

## 2016-10-02 LAB — COMPREHENSIVE METABOLIC PANEL
ALBUMIN: 4.1 g/dL (ref 3.6–5.1)
ALT: 19 U/L (ref 9–46)
AST: 17 U/L (ref 10–35)
Alkaline Phosphatase: 60 U/L (ref 40–115)
BUN: 10 mg/dL (ref 7–25)
CALCIUM: 8.9 mg/dL (ref 8.6–10.3)
CHLORIDE: 105 mmol/L (ref 98–110)
CO2: 24 mmol/L (ref 20–31)
Creat: 1.38 mg/dL — ABNORMAL HIGH (ref 0.70–1.33)
Glucose, Bld: 64 mg/dL — ABNORMAL LOW (ref 65–99)
POTASSIUM: 4.1 mmol/L (ref 3.5–5.3)
SODIUM: 139 mmol/L (ref 135–146)
TOTAL PROTEIN: 7.3 g/dL (ref 6.1–8.1)
Total Bilirubin: 0.6 mg/dL (ref 0.2–1.2)

## 2016-10-02 LAB — MAGNESIUM: MAGNESIUM: 1.9 mg/dL (ref 1.5–2.5)

## 2016-10-07 ENCOUNTER — Telehealth: Payer: Self-pay | Admitting: Cardiovascular Disease

## 2016-10-07 NOTE — Telephone Encounter (Signed)
Patient is calling and is worried that he missed a dose of his medication. Patient would like some advice just in case he did miss a dose. Thanks.

## 2016-10-07 NOTE — Telephone Encounter (Signed)
Spoke with pt and he is unsure if he took his Metoprolol today or not.  Advised pt that's ok and to just start it back tomorrow.  Pt appreciative for help.

## 2016-10-11 ENCOUNTER — Other Ambulatory Visit: Payer: Self-pay | Admitting: Cardiovascular Disease

## 2016-10-11 ENCOUNTER — Telehealth: Payer: Self-pay | Admitting: Cardiovascular Disease

## 2016-10-11 NOTE — Telephone Encounter (Signed)
Patient is calling, states that he has an hernia in his chest and needs surgery. He states that the [place] will pay for operation but referred him to [facility] and he would like to know if Dr. Claiborne Billings could refer him to have it done at [facility]. Please call, thanks.

## 2016-10-11 NOTE — Telephone Encounter (Signed)
Spoke to patient. Notes finding of a hiatal hernia. Notes he has been referred to specialist for a consult on 19th at [facility], to discuss non-invasive interventions vs surgery. Pt not sure at this time whether he would need surgery. Asking if Dr. Claiborne Billings would refer him to someone about this within the [place].  His PCP is w [place] and referred him within [place] system, but patient voiced he would feel more comfortable w Dr. Evette Georges recommendation. Pt aware I will let Dr. Claiborne Billings know and see if advice. Recommended he keep consult as scheduled for now.

## 2016-10-14 ENCOUNTER — Telehealth: Payer: Self-pay | Admitting: Cardiovascular Disease

## 2016-10-14 NOTE — Telephone Encounter (Signed)
Magnesium  Order: [id number] - Reflex for Order [id number]  Status:  Final result Visible to patient:  No (Not Released)   Comprehensive metabolic panel  Order: [id number]  Status:  Final result Visible to patient:  No (Not Released)  Notes Recorded by Therisa Doyne on 10/14/2016 at 9:59 AM EST Has appt with Dr. Claiborne Billings on 3/28. ------  Notes Recorded by Therisa Doyne on 10/14/2016 at 9:59 AM EST Results given to pt. Pt verbalized understanding.  ------  Notes Recorded by Sanda Klein, MD on 10/04/2016 at 9:24 AM EST Labs were OK. Dr. Claiborne Billings will contact re: additional ischemic workup

## 2016-10-20 ENCOUNTER — Telehealth: Payer: Self-pay | Admitting: Cardiovascular Disease

## 2016-10-20 NOTE — Telephone Encounter (Signed)
Transmission received and reviewed- no alerts. Normal device function. Previously reviewed VF episode noted- labwork done and f/u scheduled.  I made Richard [name] aware that his ICD is functioning normally and I don't see that there has been an alert. He then reports that he may be experiencing muscle spasms. I let him know he could call back if he had additional concerns. He verbalizes understanding.

## 2016-10-20 NOTE — Telephone Encounter (Signed)
Returned call to patient-patient states he feels as if his ICD is vibrating making his left side of his body vibrate.  Reports this started yesterday-occurring intermittently every 3-4 mins.  Denies CP, SOB, lightheadedness, dizziness, patient states "I feel great".  Spoke with Debroah Loop with device clinic-advised to do a remote transmission to review.   Remote transmission completed.  Patient aware transmission to be reviewed and will contact him back.

## 2016-10-20 NOTE — Telephone Encounter (Signed)
New Message  Pt voiced his ICD feels like it's vibrating and would like for someone to contact him back.

## 2016-10-26 NOTE — Telephone Encounter (Signed)
Will discuss at 3/28 office visit

## 2016-10-27 NOTE — Telephone Encounter (Signed)
Patient aware of recommendations and voiced thanks. Informed me they are holding off on the surgery for now, managing symptoms medically, due to small size of hiatal hernia. Pt aware I am routing to provider as FYI.

## 2016-11-03 ENCOUNTER — Ambulatory Visit (INDEPENDENT_AMBULATORY_CARE_PROVIDER_SITE_OTHER): Payer: Medicare Other | Admitting: Cardiovascular Disease

## 2016-11-03 ENCOUNTER — Encounter: Payer: Self-pay | Admitting: Cardiovascular Disease

## 2016-11-03 VITALS — BP 102/78 | HR 71 | Ht 70.0 in | Wt 196.4 lb

## 2016-11-03 DIAGNOSIS — I2510 Atherosclerotic heart disease of native coronary artery without angina pectoris: Secondary | ICD-10-CM

## 2016-11-03 DIAGNOSIS — B20 Human immunodeficiency virus [HIV] disease: Secondary | ICD-10-CM | POA: Diagnosis not present

## 2016-11-03 DIAGNOSIS — I10 Essential (primary) hypertension: Secondary | ICD-10-CM | POA: Diagnosis not present

## 2016-11-03 DIAGNOSIS — Z8673 Personal history of transient ischemic attack (TIA), and cerebral infarction without residual deficits: Secondary | ICD-10-CM

## 2016-11-03 DIAGNOSIS — I255 Ischemic cardiomyopathy: Secondary | ICD-10-CM

## 2016-11-03 DIAGNOSIS — Z95810 Presence of automatic (implantable) cardiac defibrillator: Secondary | ICD-10-CM | POA: Diagnosis not present

## 2016-11-03 DIAGNOSIS — I4729 Other ventricular tachycardia: Secondary | ICD-10-CM

## 2016-11-03 DIAGNOSIS — E785 Hyperlipidemia, unspecified: Secondary | ICD-10-CM | POA: Diagnosis not present

## 2016-11-03 DIAGNOSIS — I472 Ventricular tachycardia: Secondary | ICD-10-CM

## 2016-11-03 NOTE — Progress Notes (Signed)
Patient ID: Richard [name], male   DOB: February 22, 1956, 61 y.o.   MRN: [id number]    HPI:  Richard [name] is a 61 y.o. male who presents for a 9 month follow-up cardiology evaluation.   Richard [name] has a history of a CVA in 2003 and has been followed at the [facility] [place] hospital.   In 2003 he underwent cardiac catheterization which  which did not reveal significant coronary artery disease. Prior to catheterization  ejection fraction was 33% with multiple areas of scar noted on a nuclear perfusion study.   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  He states that for the past year 5 years he has had undetected virus.  He has a history of hypertension, as well as hyperlipidemia.  There is also a history of depression, for which he takes Risperdal.  I initially saw him with complaints of some episodes of left-sided chest pain with walking, associated with left arm numbness and increasing shortness of breath with activity and is referred for nuclear study. He underwent a recent nuclear perfusion study on 03/01/2014.  Ejection fraction was about 27%.  This showed extensive scar multiple coronary territories without definitive reversible ischemia.  An echo Doppler study  on 03/01/2014 showed an ejection fraction of 20-25% with severe global hypokinesis, but more pronounced inferior hypokinesis.  He underwent cardiac catheterization by me on 04/02/2014 and was found to have severe  cardiomyopathy with ejection fraction of approximately 15%.  He had multivessel CAD with 80 and 90% stenoses in the first diagonal branch of the LAD, 80-90% LAD stenoses after the first septal perforating artery, and 95% apical LAD stenosis.  There is a 95% distal inferior LV branch stenosis of the distal circumflex and the RCA was totally occluded proximally with left to right collaterals.  He had normal right heart pressures and there was no evidence for pulmonary  hypertension.  He underwent surgical evaluation and was turned down.  The following day he underwent successful intervention to his diagonal and LAD by Dr. Martinique with insertion of a 3.0x38 mm Promus DES stent in the mid LAD and 3 stents placed in the diagonal vessel.  I referred him to Dr. Sallyanne Kuster for implantation of a cardio defibrillator and a single-chamber ICD was inserted on 09/10/2014.   He saw Dr. Recardo Evangelist on 12/31/2014 for follow-up evaluation. Interrogation revealed NSVT but without delivered therapy to date. He is participating in [place] remote follow-up.  He is evaluated at the [facility] every 3-4 months for his HIV.  He denies any episodes of chest pain.  3 weeks ago, he had an ICD discharge , which awakened him from sleep.  He had run out of metoprolol for 1 week prior to the defibrillator discharge.  Past Medical History:  Diagnosis Date  . Coronary artery disease    a. cath 04/02/2014 3v dx 80-90% in D1, 80-90% mid LAD, 95% apical LAD, 95% distal inferior LV branch of LCx, total occlusion of prox RCA  b). Cath 04/03/2014 DES to mid LAD, DES x3 to prox D1, DAPT indefinitely. LCx lesion untreated  . HIV (human immunodeficiency virus infection) ([place])   . Hypertension   . Ischemic cardiomyopathy 2015   EF 20-25 percent by echo in 04/2014, s/p St[employer] Jude Fortify Assura model [id number] serial number [id number]  . Stroke [facility])     Past Surgical History:  Procedure Laterality Date  . CARDIAC CATHETERIZATION  04/02/2014  DR Claiborne Billings  . IMPLANTABLE CARDIOVERTER DEFIBRILLATOR IMPLANT N/A 09/10/2014   Procedure: IMPLANTABLE CARDIOVERTER DEFIBRILLATOR IMPLANT;  Surgeon: Sanda Klein, MD;  [facility] [place] model [id number] serial number [id number]  . LEFT AND RIGHT HEART CATHETERIZATION WITH CORONARY ANGIOGRAM N/A 04/02/2014   Procedure: LEFT AND RIGHT HEART CATHETERIZATION WITH CORONARY ANGIOGRAM;  Surgeon: Troy Sine, MD;  Location: [facility] CATH LAB;  Service: Cardiovascular;  Laterality:  N/A;  . MEDIAL COLLATERAL LIGAMENT REPAIR, KNEE Right 08/25/2015   Procedure: RIGHT THUMB RADICAL COLLATERAL LIGAMENT REPAIR;  Surgeon: Charlotte Crumb, MD;  Location: [place];  Service: Orthopedics;  Laterality: Right;  . PERCUTANEOUS CORONARY STENT INTERVENTION (PCI-S) N/A 04/03/2014   Procedure: PERCUTANEOUS CORONARY STENT INTERVENTION (PCI-S);  Surgeon: Peter M Martinique, MD;  Location: [facility] CATH LAB;  Service: Cardiovascular;  Laterality: N/A;    Allergies  Allergen Reactions  . Ioxaglate Hives  . Ivp Dye [Iodinated Diagnostic Agents] Hives    Current Outpatient Prescriptions  Medication Sig Dispense Refill  . aspirin 81 MG chewable tablet Chew 81 mg by mouth daily.    [name] atorvastatin (LIPITOR) 20 MG tablet Take 20 mg by mouth daily.    . clopidogrel (PLAVIX) 75 MG tablet Take 1 tablet (75 mg total) by mouth daily with breakfast. 30 tablet 0  . clotrimazole (LOTRIMIN) 1 % cream Apply 1 application topically as needed (For itching of foot).    . Cobicistat 150 MG TABS Take 150 mg by mouth daily.    . Darunavir Ethanolate (PREZISTA) 800 MG tablet Take 800 mg by mouth daily with breakfast.    . diphenhydrAMINE (BENADRYL) 25 MG tablet Take 25 mg by mouth 2 (two) times daily.    . dolutegravir (TIVICAY) 50 MG tablet Take 50 mg by mouth daily.    [name] ezetimibe (ZETIA) 10 MG tablet Take 5 mg by mouth daily.     . furosemide (LASIX) 20 MG tablet Take 1 tablet (20 mg total) by mouth daily as needed. 30 tablet 3  . ipratropium (ATROVENT) 0.06 % nasal spray Place 2 sprays into both nostrils 4 (four) times daily as needed for rhinitis.    [name] isosorbide mononitrate (IMDUR) 30 MG 24 hr tablet Take 0.5 tablets (15 mg total) by mouth daily. 30 tablet 6  . lisinopril (PRINIVIL,ZESTRIL) 2.5 MG tablet Take 1 tablet (2.5 mg total) by mouth at bedtime. 30 tablet 6  . loratadine (CLARITIN) 10 MG tablet Take 10 mg by mouth daily as needed for allergies.    . metoprolol (LOPRESSOR) 50 MG tablet Take 1/2 tablet twice a  day. 90 tablet 3  . naproxen (NAPROSYN) 500 MG tablet Take 1 tablet (500 mg total) by mouth 2 (two) times daily with a meal. 14 tablet 0  . Omega-3 Fatty Acids (FISH OIL) 1000 MG CAPS Take 1,000 mg by mouth daily.    [name] oxyCODONE-acetaminophen (ROXICET) 5-325 MG tablet Take 1 tablet by mouth every 4 (four) hours as needed for severe pain. 30 tablet 0  . pantoprazole (PROTONIX) 40 MG tablet Take 1 tablet (40 mg total) by mouth daily as needed. 30 tablet 0  . phenazopyridine (PYRIDIUM) 200 MG tablet Take 1 tablet (200 mg total) by mouth 3 (three) times daily. 6 tablet 0  . Polyethyl Glycol-Propyl Glycol (SYSTANE ULTRA OP) Apply 1 drop to eye daily.    . risperiDONE (RISPERDAL) 2 MG tablet Take 2 mg by mouth daily.     . ritonavir (NORVIR) 100 MG TABS tablet Take 1 mg by mouth daily  with breakfast.    . sucralfate (CARAFATE) 1 GM/10ML suspension Take 1 g by mouth 2 (two) times daily.     No current facility-administered medications for this visit.     Socially, he is married for 25 years.  Has one child.  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  He completed 12th grade education.  There is no tobacco use.  He does drink alcohol.  Family History  Problem Relation Age of Onset  . Heart disease Mother     ROS General: Negative; No fevers, chills, or night sweats HEENT: Negative; No changes in vision or hearing, sinus congestion, difficulty swallowing Pulmonary: Negative; No cough, wheezing, shortness of breath, hemoptysis Cardiovascular:  See HPI; No chest pain, presyncope, syncope, palpitations, edema GI: Negative; No nausea, vomiting, diarrhea, or abdominal pain GU: Negative; No dysuria, hematuria, or difficulty voiding Musculoskeletal: Negative; no myalgias, joint pain, or weakness Hematologic/Oncologic: Positive for HIV well-controlled on chronic therapy for years; no easy bruising, bleeding Endocrine: Negative; no heat/cold intolerance; no diabetes Neuro: Negative; no  changes in balance, headaches Skin: Negative; No rashes or skin lesions Psychiatric: History of depression. No behavioral problems,  Sleep: Negative; No daytime sleepiness, hypersomnolence, bruxism, restless legs, hypnogagnic hallucinations Other comprehensive 14 point system review is negative   Physical Exam BP 102/78   Pulse 71   Ht 5' 10"  (1.778 m)   Wt 196 lb 6.4 oz (89.1 kg)   BMI 28.18 kg/m    Repeat blood pressure by me was 108/64 supine and 90/55  standing.  Pulse was regular in the 60s without significant orthostatic pulse rise.  Wt Readings from Last 3 Encounters:  11/03/16 196 lb 6.4 oz (89.1 kg)  05/05/16 198 lb (89.8 kg)  12/30/15 189 lb 9.6 oz (86 kg)   General: Alert, oriented, no distress.  Skin: normal turgor, no rashes, warm and dry HEENT: Normocephalic, atraumatic. Pupils equal round and reactive to light; sclera anicteric; extraocular muscles intact; Fundi poorly visualized, but without hemorrhages or exudates. Nose without nasal septal hypertrophy Mouth/Parynx benign; Mallinpatti scale 2/3 Neck: No JVD, no carotid bruits; normal carotid upstroke Lungs: clear to ausculatation and percussion; no wheezing or rales Chest wall: without tenderness to palpitation; no ecchymosis or induration at the ICD implantation site. Heart: PMI not displaced, RRR, s1 s2 normal, 1/6 systolic murmur, no diastolic murmur, no rubs, gallops, thrills, or heaves Abdomen: soft, nontender; no hepatosplenomehaly, BS+; abdominal aorta nontender and not dilated by palpation. Rectal exam performed by me: Stool guaiac-negative.  Prostate without nodules. Back: no CVA tenderness Pulses 2+ Musculoskeletal: full range of motion, normal strength, no joint deformities Extremities: no clubbing cyanosis or edema, Homan's sign negative  Neurologic: grossly nonfocal; Cranial nerves grossly wnl Psychologic: Normal mood and affect  ECG (independently read by me): Sinus rhythm at 71 bpm. Two Isolated  PVCs.  Right superior axis.  T-wave changes laterally  September 2017 ECG (independently read by me): Normal sinus rhythm at 73 bpm with occasional PVC.  Right superior axis.  QTc interval 456 ms  February 2017 ECG (independently read by me):  Normal sinus rhythm at 61 bpm.  No ectopy.  Normal intervals.  QTC 404 ms.  August 2016ECG (independently read by me): Sinus rhythm at 67 bpm.  Isolated PVC.  Right superior axis.  Anterolateral T-wave changes.  June 2016 ECG (independently read by me):  Normal sinus rhythm at 65 bpm with an occasional PVC with VA conduction  February 2016ECG (independently read by  me): Normal sinus rhythm at 63 bpm.  Possible old inferior infarct.  Lateral T-wave changes.  Right superior axis  November 2015 ECG (independently read by me):  Normal sinus rhythm.  Probable old IMI.  T-wave changes V3 through V6.  ECG (independently read by me): normal sinus rhythm at 64 beats per minute.  T-wave inversion V3 through V6,  lead 1, 2 and aVF.  Prior 02/20/2014 ECG (independently read by me): Sinus rhythm with frequent PVCs with transient trigeminy, and evidence for VA conduction with his ectopy.  LABS:  BMP Latest Ref Rng & Units 10/01/2016 09/05/2015 08/25/2015  Glucose 65 - 99 mg/dL 64(L) 90 85  BUN 7 - 25 mg/dL 10 16 13   Creatinine 0.70 - 1.33 mg/dL 1.38(H) 1.49(H) 1.46(H)  Sodium 135 - 146 mmol/L 139 135 141  Potassium 3.5 - 5.3 mmol/L 4.1 4.5 4.4  Chloride 98 - 110 mmol/L 105 101 110  CO2 20 - 31 mmol/L 24 27 21(L)  Calcium 8.6 - 10.3 mg/dL 8.9 9.2 9.1   Hepatic Function Latest Ref Rng & Units 10/01/2016 09/05/2015 09/25/2014  Total Protein 6.1 - 8.1 g/dL 7.3 7.9 7.5  Albumin 3.6 - 5.1 g/dL 4.1 4.3 4.3  AST 10 - 35 U/L 17 19 19   ALT 9 - 46 U/L 19 20 23   Alk Phosphatase 40 - 115 U/L 60 76 70  Total Bilirubin 0.2 - 1.2 mg/dL 0.6 0.8 0.7   CBC Latest Ref Rng & Units 08/25/2015 09/04/2014 04/23/2014  WBC 4.0 - 10.5 K/uL 4.5 3.1(L) 3.5(L)  Hemoglobin 13.0 - 17.0 g/dL  14.9 13.7 13.6  Hematocrit 39.0 - 52.0 % 42.8 41.6 39.4  Platelets 150 - 400 K/uL 108(L) 141(L) 148(L)   Lab Results  Component Value Date   MCV 79.7 08/25/2015   MCV 80.9 09/04/2014   MCV 78.3 04/23/2014   Lab Results  Component Value Date   TSH 0.917 09/25/2014   Lipid Panel     Component Value Date/Time   CHOL 125 09/05/2015 0848   TRIG 86 09/05/2015 0848   HDL 32 (L) 09/05/2015 0848   CHOLHDL 3.9 09/05/2015 0848   VLDL 17 09/05/2015 0848   LDLCALC 76 09/05/2015 0848    RADIOLOGY: Dg Chest 2 View  02/04/2014   CLINICAL DATA:  Chest and left arm pain.  EXAM: CHEST  2 VIEW  COMPARISON:  None.  FINDINGS: The lungs are clear. Heart size is normal. No pneumothorax or pleural effusion. No focal bony abnormality.  IMPRESSION: Negative chest.   Electronically Signed   By: Inge Rise M.D.   On: 02/04/2014 10:53   IMPRESSION:  1. Cardiomyopathy, ischemic   2. Essential hypertension   3. 3-vessel coronary artery disease   4. Hyperlipidemia LDL goal <70   5. NSVT (nonsustained ventricular tachycardia) (HCC)   6. ICD (implantable cardioverter-defibrillator) in place   7. History of CVA (cerebrovascular accident)   8. Human immunodeficiency virus (HIV) disease ([place])     ASSESSMENT AND PLAN: [name] is a 61 year old, African American male who suffered a CVA in 2003.   In 2003  he was documented to have a cardiomyopathy   He developed new chest tightness and increasing shortness of breath which led to cardiac catheterization in August 2015.  His ejection fraction was 15% and he underwent successful intervention to a diffusely diseased diagonal and LAD system.  His RCA is occluded and is collateralized.  He does have very distal circumflex stenosis.  He has not had any  recurrent anginal symptoms since his intervention.  Following revascularization LV function remained low and  he underwent prophylactic ICD implantation in light of increased sudden cardiac death risk.   An echo Doppler study in August 2017: Ejection fraction is 30-35% and there was grade 2 diastolic dysfunction.  There was mild MR.  His right ventricular function was moderately reduced.  He is asymptomatic and his gradual improvement in LV function. Since I last saw him, he had undergone his GI evaluation at the [place].  Proximally 3 weeks ago, he ate had 1 out of his metoprolol and had not taken this for close to a week.  He was awakened from sleep with an apparent defibrillator discharge.  He is now been back on metoprolol.  He denies palpitations.  He is not having any anginal symptoms.  He does have mild orthostatic blood pressure drop.  There are no signs of volume overload and he will hold furosemide is only rarely been taking.  He has been taking lisinopril 2.5 mg at bedtime.  He continues to take isosorbide 15 mg.  If his blood pressure remains low, we may need to discontinue isosorbide.  He continues to be on atorvastatin 20 mg for hyperlipidemia with target LDL less than 70. He has not been taking Zetia and I suggested that this be resumed at 10 mg daily.  He is on 3 drugs for his HIV and he does not have any detectable virus.  He had laboratory done at the [facility] yesterday.  Notes try to obtain these results.  I will see him in 6 months for reevaluation.  Time spent: 25 minutes  Troy Sine, MD, [facility] 11/05/2016 4:09 PM

## 2016-11-03 NOTE — Patient Instructions (Signed)
Your physician wants you to follow-up in: 6 months or sooner if needed. You will receive a reminder letter in the mail two months in advance. If you don't receive a letter, please call our office to schedule the follow-up appointment.   If you need a refill on your cardiac medications before your next appointment, please call your pharmacy. 

## 2016-11-04 ENCOUNTER — Telehealth: Payer: Self-pay | Admitting: Cardiovascular Disease

## 2016-11-04 NOTE — Telephone Encounter (Signed)
Spoke to patient - no the cholesterol medication willl cause patient to have a panic attack  no pcp , [facility].  informed patient if occur again -  Please be elevatuated by urgent care or [facility] VERBALIZED UNDERSTANDING

## 2016-11-04 NOTE — Telephone Encounter (Signed)
New message       Pt states that he had a panic attack today.  He said that the doctor started him on a medication yesterday and was wondering if that caused panic attacks.  He did not know the name of the medication.  Please call

## 2016-12-20 ENCOUNTER — Telehealth: Payer: Self-pay | Admitting: Cardiovascular Disease

## 2016-12-20 NOTE — Telephone Encounter (Signed)
Faxed via EPIC

## 2016-12-20 NOTE — Telephone Encounter (Signed)
[place] [facility]  [phone] needs copy of pt last echo and EKG and latest office notes faxed over

## 2017-01-19 ENCOUNTER — Telehealth: Payer: Self-pay | Admitting: Cardiovascular Disease

## 2017-01-19 NOTE — Telephone Encounter (Signed)
New message    Pt is calling stating that the [place] called him and said they want to D/C his plavix because he has been on it too long. He said they need an order faxed to [phone].

## 2017-01-19 NOTE — Telephone Encounter (Signed)
Spoke to the patient. He stated that the [place] would like to discharge his plavix. the patient needed DR. Claiborne Billings to approve this and fax it to  [phone].

## 2017-01-27 NOTE — Telephone Encounter (Signed)
With his extensive CAD and significant cardiomyopathy, I recommend that the patient continue Plavix and not have it discontinued.

## 2017-03-04 ENCOUNTER — Telehealth: Payer: Self-pay | Admitting: Cardiovascular Disease

## 2017-03-04 NOTE — Telephone Encounter (Signed)
New message    Pt is calling to give fax number to get information about his plavix. [phone]

## 2017-03-04 NOTE — Telephone Encounter (Signed)
Spoke to patient, letter created with information from Dr. Claiborne Billings (see previous telephone note).   Faxed to # provided.

## 2017-03-28 ENCOUNTER — Ambulatory Visit (HOSPITAL_COMMUNITY)
Admission: EM | Admit: 2017-03-28 | Discharge: 2017-03-28 | Disposition: A | Discharge status: Home / Self Care | Payer: Medicare Other | Attending: Family Medicine | Admitting: Family Medicine

## 2017-03-28 ENCOUNTER — Encounter (HOSPITAL_COMMUNITY): Payer: Self-pay | Admitting: Family Medicine

## 2017-03-28 DIAGNOSIS — M545 Low back pain: Secondary | ICD-10-CM

## 2017-03-28 DIAGNOSIS — I5040 Unspecified combined systolic (congestive) and diastolic (congestive) heart failure: Secondary | ICD-10-CM | POA: Insufficient documentation

## 2017-03-28 DIAGNOSIS — Z8673 Personal history of transient ischemic attack (TIA), and cerebral infarction without residual deficits: Secondary | ICD-10-CM | POA: Diagnosis not present

## 2017-03-28 DIAGNOSIS — I255 Ischemic cardiomyopathy: Secondary | ICD-10-CM | POA: Insufficient documentation

## 2017-03-28 DIAGNOSIS — B20 Human immunodeficiency virus [HIV] disease: Secondary | ICD-10-CM | POA: Insufficient documentation

## 2017-03-28 DIAGNOSIS — I110 Hypertensive heart disease with heart failure: Secondary | ICD-10-CM | POA: Insufficient documentation

## 2017-03-28 DIAGNOSIS — I25110 Atherosclerotic heart disease of native coronary artery with unstable angina pectoris: Secondary | ICD-10-CM | POA: Diagnosis not present

## 2017-03-28 DIAGNOSIS — F329 Major depressive disorder, single episode, unspecified: Secondary | ICD-10-CM | POA: Diagnosis not present

## 2017-03-28 DIAGNOSIS — Z79899 Other long term (current) drug therapy: Secondary | ICD-10-CM | POA: Insufficient documentation

## 2017-03-28 DIAGNOSIS — E785 Hyperlipidemia, unspecified: Secondary | ICD-10-CM | POA: Diagnosis not present

## 2017-03-28 DIAGNOSIS — Z7982 Long term (current) use of aspirin: Secondary | ICD-10-CM | POA: Diagnosis not present

## 2017-03-28 DIAGNOSIS — R300 Dysuria: Secondary | ICD-10-CM | POA: Diagnosis not present

## 2017-03-28 DIAGNOSIS — N3090 Cystitis, unspecified without hematuria: Secondary | ICD-10-CM | POA: Diagnosis not present

## 2017-03-28 DIAGNOSIS — R358 Other polyuria: Secondary | ICD-10-CM | POA: Diagnosis present

## 2017-03-28 DIAGNOSIS — Z95810 Presence of automatic (implantable) cardiac defibrillator: Secondary | ICD-10-CM | POA: Diagnosis not present

## 2017-03-28 LAB — POCT URINALYSIS DIP (DEVICE)
BILIRUBIN URINE: NEGATIVE
Glucose, UA: NEGATIVE mg/dL
Hgb urine dipstick: NEGATIVE
KETONES UR: NEGATIVE mg/dL
Leukocytes, UA: NEGATIVE
Nitrite: NEGATIVE
PH: 6 (ref 5.0–8.0)
Protein, ur: NEGATIVE mg/dL
Urobilinogen, UA: 1 mg/dL (ref 0.0–1.0)

## 2017-03-28 MED ORDER — CIPROFLOXACIN HCL 500 MG PO TABS: 500.0000 mg | ORAL_TABLET | Freq: Two times a day (BID) | ORAL | 0 refills | Status: DC

## 2017-03-28 NOTE — Discharge Instructions (Signed)
Please return in 9-10 days to check the urine again.  If your symptoms are not gone in 2 days, please return.

## 2017-03-28 NOTE — ED Provider Notes (Signed)
[place]    CSN: [id number] Arrival date & time: 03/28/17  8366     History   Chief Complaint Chief Complaint  Patient presents with  . Urinary Tract Infection    HPI Richard [name] is a 61 y.o. male.   This 61 year old man with heart disease comes in with polyuria and frequency for 4 days. He's had no burning on urination but he has had some low back pain. He denies fever, nausea or vomiting, or abdominal pain.  Patient has had a urinary tract infection in the past that he is just like this. He is monogamous and has not had sex outside of his marriage. He denies any discharge.      Past Medical History:  Diagnosis Date  . Coronary artery disease    a. cath 04/02/2014 3v dx 80-90% in D1, 80-90% mid LAD, 95% apical LAD, 95% distal inferior LV branch of LCx, total occlusion of prox RCA  b). Cath 04/03/2014 DES to mid LAD, DES x3 to prox D1, DAPT indefinitely. LCx lesion untreated  . HIV (human immunodeficiency virus infection) ([place])   . Hypertension   . Ischemic cardiomyopathy 2015   EF 20-25 percent by echo in 04/2014, s/p St. Jude Fortify Assura model [id number] serial number [id number]  . Stroke [facility])     Patient Active Problem List   Diagnosis Date Noted  . Chronic combined systolic (congestive) and diastolic (congestive) heart failure ([place]) 12/30/2015  . Traumatic rupture of collateral ligament of other finger at metacarpophalangeal and interphalangeal joint, subsequent encounter 10/30/2015  . Traumatic rupture of collateral ligament of other finger at metacarpophalangeal and interphalangeal joint, initial encounter 08/12/2015  . NSVT (nonsustained ventricular tachycardia) ([place]) 12/31/2014  . ICD (implantable cardioverter-defibrillator) in place 09/10/2014  . At risk for sudden cardiac death 23-Aug-2014  . 3-vessel coronary artery disease 04/04/2014  . Unstable angina ([place]) 04/03/2014  . Cardiomyopathy, ischemic 03/22/2014  . History of CVA  (cerebrovascular accident) 02/20/2014  . Human immunodeficiency virus (HIV) disease ([place]) 02/20/2014  . Essential hypertension 02/20/2014  . Hyperlipidemia LDL goal <70 02/20/2014  . Depression 02/20/2014    Past Surgical History:  Procedure Laterality Date  . CARDIAC CATHETERIZATION  04/02/2014   DR Claiborne Billings  . IMPLANTABLE CARDIOVERTER DEFIBRILLATOR IMPLANT N/A 09/10/2014   Procedure: IMPLANTABLE CARDIOVERTER DEFIBRILLATOR IMPLANT;  Surgeon: Sanda Klein, MD;  [facility] [place] model [id number] serial number [id number]  . LEFT AND RIGHT HEART CATHETERIZATION WITH CORONARY ANGIOGRAM N/A 04/02/2014   Procedure: LEFT AND RIGHT HEART CATHETERIZATION WITH CORONARY ANGIOGRAM;  Surgeon: Troy Sine, MD;  Location: [facility] CATH LAB;  Service: Cardiovascular;  Laterality: N/A;  . MEDIAL COLLATERAL LIGAMENT REPAIR, KNEE Right 08/25/2015   Procedure: RIGHT THUMB RADICAL COLLATERAL LIGAMENT REPAIR;  Surgeon: Charlotte Crumb, MD;  Location: [place];  Service: Orthopedics;  Laterality: Right;  . PERCUTANEOUS CORONARY STENT INTERVENTION (PCI-S) N/A 04/03/2014   Procedure: PERCUTANEOUS CORONARY STENT INTERVENTION (PCI-S);  Surgeon: Peter M Martinique, MD;  Location: [facility] CATH LAB;  Service: Cardiovascular;  Laterality: N/A;       Home Medications    Prior to Admission medications   Medication Sig Start Date End Date Taking? Authorizing Provider  abacavir-lamiVUDine (EPZICOM) 600-300 MG tablet Take 1 tablet by mouth daily.   Yes [provider]  aspirin 81 MG chewable tablet Chew 81 mg by mouth daily.   Yes [provider]  atorvastatin (LIPITOR) 20 MG tablet Take 20 mg by mouth daily.   Yes [provider]  dolutegravir (TIVICAY) 50 MG tablet Take 50 mg by mouth daily.   Yes [provider]  Dolutegravir-Rilpivirine 50-25 MG TABS Take by mouth.   Yes [provider]  isosorbide mononitrate (IMDUR) 30 MG 24 hr tablet Take 0.5 tablets (15 mg total) by mouth daily.  03/24/15  Yes Troy Sine, MD  lisinopril (PRINIVIL,ZESTRIL) 2.5 MG tablet Take 1 tablet (2.5 mg total) by mouth at bedtime. 01/09/15  Yes Troy Sine, MD  loratadine (CLARITIN) 10 MG tablet Take 10 mg by mouth daily as needed for allergies.   Yes [provider]  metoprolol (LOPRESSOR) 50 MG tablet Take 1/2 tablet twice a day. 09/16/15  Yes Troy Sine, MD  Omega-3 Fatty Acids (FISH OIL) 1000 MG CAPS Take 1,000 mg by mouth daily.   Yes [provider]  pantoprazole (PROTONIX) 40 MG tablet Take 1 tablet (40 mg total) by mouth daily as needed. 04/04/14  Yes Almyra Deforest, PA  ciprofloxacin (CIPRO) 500 MG tablet Take 1 tablet (500 mg total) by mouth 2 (two) times daily. 03/28/17   Robyn Haber, MD  clotrimazole (LOTRIMIN) 1 % cream Apply 1 application topically as needed (For itching of foot).    [provider]  diphenhydrAMINE (BENADRYL) 25 MG tablet Take 25 mg by mouth 2 (two) times daily.    [provider]  furosemide (LASIX) 20 MG tablet Take 1 tablet (20 mg total) by mouth daily as needed. 06/25/14   Troy Sine, MD  ipratropium (ATROVENT) 0.06 % nasal spray Place 2 sprays into both nostrils 4 (four) times daily as needed for rhinitis. 10/05/13   Presson, Audelia Hives, PA    Family History Family History  Problem Relation Age of Onset  . Heart disease Mother     Social History Social History  Substance Use Topics  . Smoking status: Never Smoker  . Smokeless tobacco: Never Used  . Alcohol use No     Allergies   Ioxaglate and Ivp dye [iodinated diagnostic agents]   Review of Systems Review of Systems  Genitourinary: Positive for frequency.  All other systems reviewed and are negative.    Physical Exam Triage Vital Signs ED Triage Vitals [03/28/17 1006]  Enc Vitals Group     BP 122/84     Pulse Rate (!) 56     Resp 18     Temp 97.7 F (36.5 C)     Temp Source Oral     SpO2 98 %     Weight      Height      Head  Circumference      Peak Flow      Pain Score      Pain Loc      Pain Edu?      Excl. in [place]?    No data found.   Updated Vital Signs BP 122/84 (BP Location: Right Arm)   Pulse (!) 56   Temp 97.7 F (36.5 C) (Oral)   Resp 18   SpO2 98%   Visual Acuity Right Eye Distance:   Left Eye Distance:   Bilateral Distance:    Right Eye Near:   Left Eye Near:    Bilateral Near:     Physical Exam  Constitutional: He is oriented to person, place, and time. He appears well-developed and well-nourished.  HENT:  Right Ear: External ear normal.  Left Ear: External ear normal.  Eyes: Pupils are equal, round, and reactive to light. Conjunctivae are normal.  Neck: Normal range of motion. Neck supple.  Pulmonary/Chest: Effort normal.  Abdominal: Soft. There is no tenderness.  Musculoskeletal: Normal range of motion.  Neurological: He is alert and oriented to person, place, and time.  Skin: Skin is warm and dry.  Nursing note and vitals reviewed.    UC Treatments / Results  Labs (all labs ordered are listed, but only abnormal results are displayed) Labs Reviewed  URINE CULTURE  POCT URINALYSIS DIP (DEVICE)    EKG  EKG Interpretation None       Radiology No results found.  Procedures Procedures (including critical care time)  Medications Ordered in UC Medications - No data to display   Initial Impression / Assessment and Plan / UC Course  I have reviewed the triage vital signs and the nursing notes.  Pertinent labs & imaging results that were available during my care of the patient were reviewed by me and considered in my medical decision making (see chart for details).     Final Clinical Impressions(s) / UC Diagnoses   Final diagnoses:  Cystitis    New Prescriptions Discharge Medication List as of 03/28/2017 11:08 AM    START taking these medications   Details  ciprofloxacin (CIPRO) 500 MG tablet Take 1 tablet (500 mg total) by mouth 2 (two) times daily.,  Starting Mon 03/28/2017, Normal         Controlled Substance Prescriptions [place] Controlled Substance Registry consulted? Not Applicable   Robyn Haber, MD 03/28/17 [phone]

## 2017-03-28 NOTE — ED Triage Notes (Signed)
Pt here for UTI sx onset 4 days associated w/urinary freq, back pain  Denies fevers, chills, n/v, hematuria, dysuria  Reports hx of UTI  A&O x4... NAD... Ambulatory

## 2017-03-30 LAB — URINE CULTURE: Culture: <10000 — AB

## 2017-07-28 ENCOUNTER — Encounter (HOSPITAL_COMMUNITY): Payer: Self-pay | Admitting: Emergency Medicine

## 2017-07-28 ENCOUNTER — Ambulatory Visit (HOSPITAL_COMMUNITY)
Admission: EM | Admit: 2017-07-28 | Discharge: 2017-07-28 | Disposition: A | Discharge status: Home / Self Care | Payer: Medicare Other | Attending: Family Medicine | Admitting: Family Medicine

## 2017-07-28 DIAGNOSIS — L500 Allergic urticaria: Secondary | ICD-10-CM | POA: Diagnosis not present

## 2017-07-28 MED ORDER — TRIAMCINOLONE ACETONIDE 40 MG/ML IJ SUSP
INTRAMUSCULAR | Status: AC
  Filled 2017-07-28: qty 1

## 2017-07-28 MED ORDER — PREDNISONE 50 MG PO TABS: ORAL_TABLET | ORAL | 0 refills | Status: DC

## 2017-07-28 MED ORDER — TRIAMCINOLONE ACETONIDE 40 MG/ML IJ SUSP
40.0000 mg | Freq: Once | INTRAMUSCULAR | Status: AC
  Administered 2017-07-28: 40 mg via INTRAMUSCULAR

## 2017-07-28 NOTE — Discharge Instructions (Signed)
Start taking the prednisone tablets tomorrow every morning for the next 5 days. Take with food. During the day may take Allegra or Zyrtec to help with itching. If while at home or at nighttime you need additional itching medication take Benadryl 50 mg every 4-6 hours. This can make you drowsy.

## 2017-07-28 NOTE — ED Triage Notes (Signed)
PT C/O: hives all over body .... Reports taking OTC vitamins x1 month   ONSET: 2 days  DENIES: fevers,   TAKING MEDS: benadryl   A&O x4... NAD... Ambulatory

## 2017-07-28 NOTE — ED Provider Notes (Signed)
[name]    CSN: [id number] Arrival date & time: 07/28/17  1403     History   Chief Complaint Chief Complaint  Patient presents with  . Urticaria    HPI Richard [name] is a 61 y.o. male.   61 year old male states that last night he developed hives all over his body with moderate to intense itching. Most of this had abated by today and he is been mostly asymptomatic. He still has some itching of the back and one small round red spot to the right hand which is difficult to see. Denies swelling, trouble breathing, wheezing or other associated symptoms.      Past Medical History:  Diagnosis Date  . Coronary artery disease    a. cath 04/02/2014 3v dx 80-90% in D1, 80-90% mid LAD, 95% apical LAD, 95% distal inferior LV branch of LCx, total occlusion of prox RCA  b). Cath 04/03/2014 DES to mid LAD, DES x3 to prox D1, DAPT indefinitely. LCx lesion untreated  . HIV (human immunodeficiency virus infection) ([place])   . Hypertension   . Ischemic cardiomyopathy 2015   EF 20-25 percent by echo in 04/2014, s/p St. Jude Fortify Assura model [id number] serial number [id number]  . Stroke [facility])     Patient Active Problem List   Diagnosis Date Noted  . Chronic combined systolic (congestive) and diastolic (congestive) heart failure ([place]) 12/30/2015  . Traumatic rupture of collateral ligament of other finger at metacarpophalangeal and interphalangeal joint, subsequent encounter 10/30/2015  . Traumatic rupture of collateral ligament of other finger at metacarpophalangeal and interphalangeal joint, initial encounter 08/12/2015  . NSVT (nonsustained ventricular tachycardia) ([place]) 12/31/2014  . ICD (implantable cardioverter-defibrillator) in place 09/10/2014  . At risk for sudden cardiac death 2014/09/21  . 3-vessel coronary artery disease 04/04/2014  . Unstable angina ([place]) 04/03/2014  . Cardiomyopathy, ischemic 03/22/2014  . History of CVA (cerebrovascular accident) 02/20/2014   . Human immunodeficiency virus (HIV) disease ([place]) 02/20/2014  . Essential hypertension 02/20/2014  . Hyperlipidemia LDL goal <70 02/20/2014  . Depression 02/20/2014    Past Surgical History:  Procedure Laterality Date  . CARDIAC CATHETERIZATION  04/02/2014   DR Claiborne Billings  . IMPLANTABLE CARDIOVERTER DEFIBRILLATOR IMPLANT N/A 09/10/2014   Procedure: IMPLANTABLE CARDIOVERTER DEFIBRILLATOR IMPLANT;  Surgeon: Sanda Klein, MD;  [facility] [place] model [id number] serial number [id number]  . LEFT AND RIGHT HEART CATHETERIZATION WITH CORONARY ANGIOGRAM N/A 04/02/2014   Procedure: LEFT AND RIGHT HEART CATHETERIZATION WITH CORONARY ANGIOGRAM;  Surgeon: Troy Sine, MD;  Location: [facility] CATH LAB;  Service: Cardiovascular;  Laterality: N/A;  . MEDIAL COLLATERAL LIGAMENT REPAIR, KNEE Right 08/25/2015   Procedure: RIGHT THUMB RADICAL COLLATERAL LIGAMENT REPAIR;  Surgeon: Charlotte Crumb, MD;  Location: [place];  Service: Orthopedics;  Laterality: Right;  . PERCUTANEOUS CORONARY STENT INTERVENTION (PCI-S) N/A 04/03/2014   Procedure: PERCUTANEOUS CORONARY STENT INTERVENTION (PCI-S);  Surgeon: Peter M Martinique, MD;  Location: [facility] CATH LAB;  Service: Cardiovascular;  Laterality: N/A;       Home Medications    Prior to Admission medications   Medication Sig Start Date End Date Taking? Authorizing Provider  abacavir-lamiVUDine (EPZICOM) 600-300 MG tablet Take 1 tablet by mouth daily.   Yes [provider]  aspirin 81 MG chewable tablet Chew 81 mg by mouth daily.   Yes [provider]  atorvastatin (LIPITOR) 20 MG tablet Take 20 mg by mouth daily.   Yes [provider]  diphenhydrAMINE (BENADRYL) 25 MG tablet Take 25  mg by mouth 2 (two) times daily.   Yes [provider]  dolutegravir (TIVICAY) 50 MG tablet Take 50 mg by mouth daily.   Yes [provider]  Dolutegravir-Rilpivirine 50-25 MG TABS Take by mouth.   Yes [provider]  isosorbide mononitrate  (IMDUR) 30 MG 24 hr tablet Take 0.5 tablets (15 mg total) by mouth daily. 03/24/15  Yes Troy Sine, MD  lisinopril (PRINIVIL,ZESTRIL) 2.5 MG tablet Take 1 tablet (2.5 mg total) by mouth at bedtime. 01/09/15  Yes Troy Sine, MD  metoprolol (LOPRESSOR) 50 MG tablet Take 1/2 tablet twice a day. 09/16/15  Yes Troy Sine, MD  pantoprazole (PROTONIX) 40 MG tablet Take 1 tablet (40 mg total) by mouth daily as needed. 04/04/14  Yes Almyra Deforest, PA  clotrimazole (LOTRIMIN) 1 % cream Apply 1 application topically as needed (For itching of foot).    [provider]  furosemide (LASIX) 20 MG tablet Take 1 tablet (20 mg total) by mouth daily as needed. 06/25/14   Troy Sine, MD  ipratropium (ATROVENT) 0.06 % nasal spray Place 2 sprays into both nostrils 4 (four) times daily as needed for rhinitis. 10/05/13   Presson, Audelia Hives, PA  loratadine (CLARITIN) 10 MG tablet Take 10 mg by mouth daily as needed for allergies.    [provider]  Omega-3 Fatty Acids (FISH OIL) 1000 MG CAPS Take 1,000 mg by mouth daily.    [provider]  predniSONE (DELTASONE) 50 MG tablet 1 tab po daily for 5 days. Take with food. 07/28/17   Janne Napoleon, NP    Family History Family History  Problem Relation Age of Onset  . Heart disease Mother     Social History Social History   Tobacco Use  . Smoking status: Never Smoker  . Smokeless tobacco: Never Used  Substance Use Topics  . Alcohol use: No  . Drug use: No     Allergies   Ioxaglate and Ivp dye [iodinated diagnostic agents]   Review of Systems Review of Systems  Constitutional: Negative.   HENT: Negative.   Respiratory: Negative.  Negative for cough, shortness of breath and wheezing.   Gastrointestinal: Negative.   Skin: Positive for rash.  Neurological: Negative.   All other systems reviewed and are negative.    Physical Exam Triage Vital Signs ED Triage Vitals [07/28/17 1426]  Enc Vitals Group     BP 108/77       Pulse Rate 70     Resp 20     Temp 98.5 F (36.9 C)     Temp Source Oral     SpO2 98 %     Weight      Height      Head Circumference      Peak Flow      Pain Score      Pain Loc      Pain Edu?      Excl. in [place]?    No data found.  Updated Vital Signs BP 108/77 (BP Location: Left Arm)   Pulse 70   Temp 98.5 F (36.9 C) (Oral)   Resp 20   SpO2 98%   Visual Acuity Right Eye Distance:   Left Eye Distance:   Bilateral Distance:    Right Eye Near:   Left Eye Near:    Bilateral Near:     Physical Exam  Constitutional: He is oriented to person, place, and time. He appears well-developed and well-nourished.  No distress.  HENT:  Head: Normocephalic and atraumatic.  Nose: Nose normal.  Mouth/Throat: Oropharynx is clear and moist. No oropharyngeal exudate.  Eyes: Conjunctivae and EOM are normal. Right eye exhibits no discharge. Left eye exhibits no discharge.  Neck: Normal range of motion. Neck supple.  Cardiovascular: Normal rate, regular rhythm and normal heart sounds.  Pulmonary/Chest: Effort normal and breath sounds normal. No stridor. No respiratory distress. He has no wheezes.  Musculoskeletal: He exhibits no edema.  Lymphadenopathy:    He has no cervical adenopathy.  Neurological: He is alert and oriented to person, place, and time. He exhibits normal muscle tone.  Skin: Skin is warm and dry. No rash noted.  No urticarial lesions seen on exam.  Psychiatric: He has a normal mood and affect.  Nursing note and vitals reviewed.    UC Treatments / Results  Labs (all labs ordered are listed, but only abnormal results are displayed) Labs Reviewed - No data to display  EKG  EKG Interpretation None       Radiology No results found.  Procedures Procedures (including critical care time)  Medications Ordered in UC Medications  triamcinolone acetonide (KENALOG-40) injection 40 mg (not administered)     Initial Impression / Assessment and Plan / UC  Course  I have reviewed the triage vital signs and the nursing notes.  Pertinent labs & imaging results that were available during my care of the patient were reviewed by me and considered in my medical decision making (see chart for details).    Start taking the prednisone tablets tomorrow every morning for the next 5 days. Take with food. During the day may take Allegra or Zyrtec to help with itching. If while at home or at nighttime you need additional itching medication take Benadryl 50 mg every 4-6 hours. This can make you drowsy.    Final Clinical Impressions(s) / UC Diagnoses   Final diagnoses:  Allergic urticaria    ED Discharge Orders        Ordered    predniSONE (DELTASONE) 50 MG tablet     07/28/17 1445       Controlled Substance Prescriptions  Controlled Substance Registry consulted? Not Applicable   Janne Napoleon, NP 07/28/17 1450

## 2017-09-02 ENCOUNTER — Encounter (HOSPITAL_COMMUNITY): Payer: Self-pay | Admitting: Emergency Medicine

## 2017-09-02 ENCOUNTER — Ambulatory Visit (HOSPITAL_COMMUNITY)
Admission: EM | Admit: 2017-09-02 | Discharge: 2017-09-02 | Disposition: A | Discharge status: Home / Self Care | Payer: Medicare Other | Attending: Family Medicine | Admitting: Family Medicine

## 2017-09-02 DIAGNOSIS — M79662 Pain in left lower leg: Secondary | ICD-10-CM

## 2017-09-02 MED ORDER — PREDNISONE 20 MG PO TABS: ORAL_TABLET | ORAL | 0 refills | Status: DC

## 2017-09-02 NOTE — Discharge Instructions (Signed)
This is a plantaris muscle strain which should resolve in 2-3 days with the prednisone I have prescribed.

## 2017-09-02 NOTE — ED Triage Notes (Signed)
PT C/O: LLE pain onset today ... Reports he was walking on a treadmill and 2 hours after he felt pain starting from calf muscle that radiates upwards.    Pain increases w/activity   TAKING MEDS: Ibuprofen   A&O x4... NAD... Ambulatory

## 2017-09-02 NOTE — ED Provider Notes (Signed)
[place]   [id number] 09/02/17 Arrival Time: 1234   SUBJECTIVE:  Richard [name] is a 62 y.o. male who presents to the urgent care with complaint of LLE pain onset today ... Reports he was walking on a treadmill and 2 hours after he felt pain starting from calf muscle that radiates upwards.    Pain increases w/activity     Past Medical History:  Diagnosis Date  . Coronary artery disease    a. cath 04/02/2014 3v dx 80-90% in D1, 80-90% mid LAD, 95% apical LAD, 95% distal inferior LV branch of LCx, total occlusion of prox RCA  b). Cath 04/03/2014 DES to mid LAD, DES x3 to prox D1, DAPT indefinitely. LCx lesion untreated  . HIV (human immunodeficiency virus infection) ([place])   . Hypertension   . Ischemic cardiomyopathy 2015   EF 20-25 percent by echo in 04/2014, s/p St. Jude Fortify Assura model [id number] serial number [id number]  . Stroke [facility])    Family History  Problem Relation Age of Onset  . Heart disease Mother    Social History   Socioeconomic History  . Marital status: Married    Spouse name: Not on file  . Number of children: Not on file  . Years of education: Not on file  . Highest education level: Not on file  Social Needs  . Financial resource strain: Not on file  . Food insecurity - worry: Not on file  . Food insecurity - inability: Not on file  . Transportation needs - medical: Not on file  . Transportation needs - non-medical: Not on file  Occupational History  . Not on file  Tobacco Use  . Smoking status: Never Smoker  . Smokeless tobacco: Never Used  Substance and Sexual Activity  . Alcohol use: No  . Drug use: No  . Sexual activity: Not on file  Other Topics Concern  . Not on file  Social History Narrative  . Not on file   Current Meds  Medication Sig  . aspirin 81 MG chewable tablet Chew 81 mg by mouth daily.  [name] atorvastatin (LIPITOR) 20 MG tablet Take 20 mg by mouth daily.  . dolutegravir (TIVICAY) 50 MG tablet Take 50 mg by  mouth daily.  . Dolutegravir-Rilpivirine 50-25 MG TABS Take by mouth.  . furosemide (LASIX) 20 MG tablet Take 1 tablet (20 mg total) by mouth daily as needed.  . isosorbide mononitrate (IMDUR) 30 MG 24 hr tablet Take 0.5 tablets (15 mg total) by mouth daily.  [name] lisinopril (PRINIVIL,ZESTRIL) 2.5 MG tablet Take 1 tablet (2.5 mg total) by mouth at bedtime.  [name] loratadine (CLARITIN) 10 MG tablet Take 10 mg by mouth daily as needed for allergies.  . metoprolol (LOPRESSOR) 50 MG tablet Take 1/2 tablet twice a day.  . Omega-3 Fatty Acids (FISH OIL) 1000 MG CAPS Take 1,000 mg by mouth daily.  . pantoprazole (PROTONIX) 40 MG tablet Take 1 tablet (40 mg total) by mouth daily as needed.  . [DISCONTINUED] clotrimazole (LOTRIMIN) 1 % cream Apply 1 application topically as needed (For itching of foot).   Allergies  Allergen Reactions  . Ioxaglate Hives  . Ivp Dye [Iodinated Diagnostic Agents] Hives      ROS: As per HPI, remainder of ROS negative.   OBJECTIVE:   Vitals:   09/02/17 1325  BP: 102/69  Pulse: 73  Resp: 20  Temp: 98.1 F (36.7 C)  TempSrc: Oral  SpO2: 99%     General appearance: alert; no  distress Eyes: PERRL; EOMI; conjunctiva normal HENT: normocephalic; atraumatic;  Neck: supple Back: no CVA tenderness Extremities: no cyanosis or edema; symmetrical with no gross deformities;  Tender belly of left calf down to upper portion of achilles tendon Skin: warm and dry Neurologic: normal gait; grossly normal Psychological: alert and cooperative; normal mood and affect      Labs:  Results for orders placed or performed during the hospital encounter of 03/28/17  Urine culture  Result Value Ref Range   Specimen Description URINE, CLEAN CATCH    Special Requests NONE    Culture <10,000 COLONIES/mL INSIGNIFICANT GROWTH (A)    Report Status 03/30/2017 FINAL   POCT urinalysis dip (device)  Result Value Ref Range   Glucose, UA NEGATIVE NEGATIVE mg/dL   Bilirubin Urine  NEGATIVE NEGATIVE   Ketones, ur NEGATIVE NEGATIVE mg/dL   Specific Gravity, Urine <=1.005 1.005 - 1.030   Hgb urine dipstick NEGATIVE NEGATIVE   pH 6.0 5.0 - 8.0   Protein, ur NEGATIVE NEGATIVE mg/dL   Urobilinogen, UA 1.0 0.0 - 1.0 mg/dL   Nitrite NEGATIVE NEGATIVE   Leukocytes, UA NEGATIVE NEGATIVE    Labs Reviewed - No data to display  No results found.     ASSESSMENT & PLAN:  1. Pain of left calf     Meds ordered this encounter  Medications  . predniSONE (DELTASONE) 20 MG tablet    Sig: Two daily with food    Dispense:  6 tablet    Refill:  0    Reviewed expectations re: course of current medical issues. Questions answered. Outlined signs and symptoms indicating need for more acute intervention. Patient verbalized understanding. After Visit Summary given.      Robyn Haber, MD 09/02/17 1354

## 2017-09-28 ENCOUNTER — Encounter: Payer: Medicare Other | Admitting: Cardiovascular Disease

## 2017-09-29 ENCOUNTER — Encounter: Payer: Self-pay | Admitting: Cardiovascular Disease

## 2017-09-29 ENCOUNTER — Ambulatory Visit (INDEPENDENT_AMBULATORY_CARE_PROVIDER_SITE_OTHER): Payer: Medicare Other | Admitting: Cardiovascular Disease

## 2017-09-29 VITALS — BP 104/76 | HR 68 | Ht 70.0 in | Wt 194.8 lb

## 2017-09-29 DIAGNOSIS — I5042 Chronic combined systolic (congestive) and diastolic (congestive) heart failure: Secondary | ICD-10-CM | POA: Diagnosis not present

## 2017-09-29 DIAGNOSIS — I472 Ventricular tachycardia, unspecified: Secondary | ICD-10-CM

## 2017-09-29 DIAGNOSIS — I2510 Atherosclerotic heart disease of native coronary artery without angina pectoris: Secondary | ICD-10-CM

## 2017-09-29 DIAGNOSIS — E7800 Pure hypercholesterolemia, unspecified: Secondary | ICD-10-CM

## 2017-09-29 DIAGNOSIS — Z95810 Presence of automatic (implantable) cardiac defibrillator: Secondary | ICD-10-CM | POA: Diagnosis not present

## 2017-09-29 NOTE — Patient Instructions (Signed)
Dr Sallyanne Kuster recommends that you continue on your current medications as directed. Please refer to the Current Medication list given to you today.  Remote monitoring is used to monitor your Pacemaker or ICD from home. This monitoring reduces the number of office visits required to check your device to one time per year. It allows [place] to keep an eye on the functioning of your device to ensure it is working properly. You are scheduled for a device check from home on Thursday, May 23rd, 2019. You may send your transmission at any time that day. If you have a wireless device, the transmission will be sent automatically. After your physician reviews your transmission, you will receive a notification with your next transmission date.  Dr Sallyanne Kuster recommends that you schedule a follow-up appointment in 12 months with an ICD check. You will receive a reminder letter in the mail two months in advance. If you don't receive a letter, please call our office to schedule the follow-up appointment.  If you need a refill on your cardiac medications before your next appointment, please call your pharmacy.

## 2017-09-29 NOTE — Progress Notes (Signed)
Patient ID: Richard [name], male   DOB: 10-10-1955, 62 y.o.   MRN: [id number]    Cardiology Office Note    Date:  09/29/2017   ID:  Richard [name], DOB January 22, 1956, MRN [id number]  PCP:  System, Provider Not In  Cardiologist:  Shelva Majestic, M.D.; Sanda Klein, MD   Chief Complaint  Patient presents with  . Follow-up  . Headache    Pt thinks this is due to a side effect from his isosorbide     History of Present Illness:  Richard [name] is a 62 y.o. male with severe ischemic cardiomyopathy following an extensive LAD artery distribution myocardial infarction, well compensated chronic systolic and diastolic heart failure, roughly 2 years following implantation of a single-chamber St. Jude defibrillator for primary prevention.  He received an appropriate shock for ventricular fibrillation on September 30, 2016, after he been out of his metoprolol for about a week.  Since then he has had only one episode of nonsustained VT, roughly 22 beats in April 2018.  No episodes of sustained or nonsustained arrhythmia since.  Defibrillator interrogation shows normal device function.  Estimated generator longevity of 6.5-6.8 years, normal lead parameters.  His optivol is actually higher than his baseline, suggesting he is a little dry.  He is recently been using some laxatives.  The patient specifically denies any chest pain at rest or with exertion, dyspnea at rest or with exertion, orthopnea, paroxysmal nocturnal dyspnea, syncope, palpitations, focal neurological deficits, intermittent claudication, lower extremity edema, unexplained weight gain, cough, hemoptysis or wheezing.  Coronary artery disease problems date back to the early 2000's. His last cardiac catheterization in 2015 led to placement of a 3.0 x 38 mm Promus drug-eluting stent in the mid LAD artery and 3 stents in the diagonal vessel. His nuclear stress test in 2015 showed severely reduced trace uptake in the entire anterior wall,  apex and inferior wall and moderately reduced uptake in the lateral wall. His ejection fraction was 27% by nuclear scintigraphy and 20-25 percent by echocardiography. He has a remote history of right temporal ischemic stroke in 2008. In February 2018 he received appropriate defibrillator discharge for ventricular fibrillation, during a period of beta-blocker rebound.  Past Medical History:  Diagnosis Date  . Coronary artery disease    a. cath 04/02/2014 3v dx 80-90% in D1, 80-90% mid LAD, 95% apical LAD, 95% distal inferior LV branch of LCx, total occlusion of prox RCA  b). Cath 04/03/2014 DES to mid LAD, DES x3 to prox D1, DAPT indefinitely. LCx lesion untreated  . HIV (human immunodeficiency virus infection) ([place])   . Hypertension   . Ischemic cardiomyopathy 2015   EF 20-25 percent by echo in 04/2014, s/p St. Jude Fortify Assura model [id number] serial number [id number]  . Stroke [facility])     Past Surgical History:  Procedure Laterality Date  . CARDIAC CATHETERIZATION  04/02/2014   DR Claiborne Billings  . IMPLANTABLE CARDIOVERTER DEFIBRILLATOR IMPLANT N/A 09/10/2014   Procedure: IMPLANTABLE CARDIOVERTER DEFIBRILLATOR IMPLANT;  Surgeon: Sanda Klein, MD;  [facility] model [id number] serial number [id number]  . LEFT AND RIGHT HEART CATHETERIZATION WITH CORONARY ANGIOGRAM N/A 04/02/2014   Procedure: LEFT AND RIGHT HEART CATHETERIZATION WITH CORONARY ANGIOGRAM;  Surgeon: Troy Sine, MD;  Location: [facility] CATH LAB;  Service: Cardiovascular;  Laterality: N/A;  . MEDIAL COLLATERAL LIGAMENT REPAIR, KNEE Right 08/25/2015   Procedure: RIGHT THUMB RADICAL COLLATERAL LIGAMENT REPAIR;  Surgeon: Charlotte Crumb, MD;  Location: [facility];  Service: Orthopedics;  Laterality: Right;  . PERCUTANEOUS CORONARY STENT INTERVENTION (PCI-S) N/A 04/03/2014   Procedure: PERCUTANEOUS CORONARY STENT INTERVENTION (PCI-S);  Surgeon: Peter M Martinique, MD;  Location: [facility] CATH LAB;  Service: Cardiovascular;  Laterality: N/A;    Current  Medications: Outpatient Medications Prior to Visit  Medication Sig Dispense Refill  . abacavir-lamiVUDine (EPZICOM) 600-300 MG tablet Take 1 tablet by mouth daily.    [name] aspirin 81 MG chewable tablet Chew 81 mg by mouth daily.    [name] atorvastatin (LIPITOR) 20 MG tablet Take 20 mg by mouth daily.    . diphenhydrAMINE (BENADRYL) 25 MG tablet Take 25 mg by mouth 2 (two) times daily.    . dolutegravir (TIVICAY) 50 MG tablet Take 50 mg by mouth daily.    . Dolutegravir-Rilpivirine 50-25 MG TABS Take by mouth.    . furosemide (LASIX) 20 MG tablet Take 1 tablet (20 mg total) by mouth daily as needed. 30 tablet 3  . isosorbide mononitrate (IMDUR) 30 MG 24 hr tablet Take 0.5 tablets (15 mg total) by mouth daily. 30 tablet 6  . lisinopril (PRINIVIL,ZESTRIL) 2.5 MG tablet Take 1 tablet (2.5 mg total) by mouth at bedtime. 30 tablet 6  . loratadine (CLARITIN) 10 MG tablet Take 10 mg by mouth daily as needed for allergies.    . metoprolol (LOPRESSOR) 50 MG tablet Take 1/2 tablet twice a day. 90 tablet 3  . Omega-3 Fatty Acids (FISH OIL) 1000 MG CAPS Take 1,000 mg by mouth daily.    . pantoprazole (PROTONIX) 40 MG tablet Take 1 tablet (40 mg total) by mouth daily as needed. 30 tablet 0  . predniSONE (DELTASONE) 20 MG tablet Two daily with food 6 tablet 0   No facility-administered medications prior to visit.      Allergies:   Ioxaglate and Ivp dye [iodinated diagnostic agents]   Social History   Socioeconomic History  . Marital status: Married    Spouse name: None  . Number of children: None  . Years of education: None  . Highest education level: None  Social Needs  . Financial resource strain: None  . Food insecurity - worry: None  . Food insecurity - inability: None  . Transportation needs - medical: None  . Transportation needs - non-medical: None  Occupational History  . None  Tobacco Use  . Smoking status: Never Smoker  . Smokeless tobacco: Never Used  Substance and Sexual Activity  .  Alcohol use: No  . Drug use: No  . Sexual activity: None  Other Topics Concern  . None  Social History Narrative  . None     Family History:  The patient's family history includes Heart disease in his mother.   ROS:   Please see the history of present illness.    ROS All other systems reviewed and are negative.   PHYSICAL EXAM:   VS:  BP 104/76 (BP Location: Right Arm, Patient Position: Sitting, Cuff Size: Normal)   Pulse 68   Ht 5\' 10"  (1.778 m)   Wt 194 lb 12.8 oz (88.4 kg)   BMI 27.95 kg/m     General: Alert, oriented x3, no distress, mildly overweight Head: no evidence of trauma, PERRL, EOMI, no exophtalmos or lid lag, no myxedema, no xanthelasma; normal ears, nose and oropharynx Neck: normal jugular venous pulsations and no hepatojugular reflux; brisk carotid pulses without delay and no carotid bruits Chest: clear to auscultation, no signs of consolidation by percussion or palpation, normal fremitus, symmetrical and full respiratory excursions,  healthy left subclavian defibrillator site Cardiovascular: normal position and quality of the apical impulse, regular rhythm, normal first and second heart sounds, no murmurs, rubs or gallops Abdomen: no tenderness or distention, no masses by palpation, no abnormal pulsatility or arterial bruits, normal bowel sounds, no hepatosplenomegaly Extremities: no clubbing, cyanosis or edema; 2+ radial, ulnar and brachial pulses bilaterally; 2+ right femoral, posterior tibial and dorsalis pedis pulses; 2+ left femoral, posterior tibial and dorsalis pedis pulses; no subclavian or femoral bruits Neurological: grossly nonfocal Psych: Normal mood and affect   Wt Readings from Last 3 Encounters:  09/29/17 194 lb 12.8 oz (88.4 kg)  11/03/16 196 lb 6.4 oz (89.1 kg)  05/05/16 198 lb (89.8 kg)      Studies/Labs Reviewed:   EKG:  EKG is ordered today.  Sinus rhythms with PVCs and a single ventricular couplet, nonspecific intraventricular  conduction delay with a QRS of 102 ms, northwest axis, QTC 421 ms  Recent Labs: 10/01/2016: ALT 19; BUN 10; Creat 1.38; Magnesium 1.9; Potassium 4.1; Sodium 139   Lipid Panel    Component Value Date/Time   CHOL 125 09/05/2015 0848   TRIG 86 09/05/2015 0848   HDL 32 (L) 09/05/2015 0848   CHOLHDL 3.9 09/05/2015 0848   VLDL 17 09/05/2015 0848   LDLCALC 76 09/05/2015 0848     ASSESSMENT:    No diagnosis found.   PLAN:  In order of problems listed above:  1. CHF: euvolemic clinically, maybe "dry" by Optivol. On appropriate meds, but limited by BP. NYHA class 1-2. Most recently estimated EF, Aug 2017 30-35%. 2. CAD: no angina, no ischemia by most recent nuclear stress test, last revascularization procedure 2015. 3. VT: appropriate therapy in Feb 2018 (metoprolol rebound); no significant episodes of NSVT  in last 10 months. 4. HLP: On statin.  HDL remains low. Note potential for numerous drug interactions with his HIV medications. 5. ICD: normal device function. Implanted for primary prevention and has delivered shock therapy appropriately.    Medication Adjustments/Labs and Tests Ordered: Current medicines are reviewed at length with the patient today.  Concerns regarding medicines are outlined above.  Medication changes, Labs and Tests ordered today are listed in the Patient Instructions below. There are no Patient Instructions on file for this visit.   Signed, Sanda Klein, MD  09/29/2017 3:50 PM    [facility] [place], [place], [place]  [address] Phone: [phone]; Fax: [phone]

## 2017-10-13 DIAGNOSIS — L509 Urticaria, unspecified: Secondary | ICD-10-CM | POA: Diagnosis not present

## 2017-10-13 DIAGNOSIS — N529 Male erectile dysfunction, unspecified: Secondary | ICD-10-CM | POA: Insufficient documentation

## 2017-10-13 DIAGNOSIS — E782 Mixed hyperlipidemia: Secondary | ICD-10-CM | POA: Diagnosis not present

## 2017-10-13 DIAGNOSIS — I10 Essential (primary) hypertension: Secondary | ICD-10-CM | POA: Diagnosis not present

## 2017-10-13 DIAGNOSIS — G245 Blepharospasm: Secondary | ICD-10-CM | POA: Diagnosis not present

## 2017-10-13 DIAGNOSIS — L500 Allergic urticaria: Secondary | ICD-10-CM | POA: Diagnosis not present

## 2017-10-14 DIAGNOSIS — N529 Male erectile dysfunction, unspecified: Secondary | ICD-10-CM | POA: Diagnosis not present

## 2017-10-14 DIAGNOSIS — L509 Urticaria, unspecified: Secondary | ICD-10-CM | POA: Diagnosis not present

## 2017-10-14 DIAGNOSIS — E782 Mixed hyperlipidemia: Secondary | ICD-10-CM | POA: Diagnosis not present

## 2017-10-16 DIAGNOSIS — D696 Thrombocytopenia, unspecified: Secondary | ICD-10-CM | POA: Insufficient documentation

## 2017-10-16 DIAGNOSIS — N183 Chronic kidney disease, stage 3 unspecified: Secondary | ICD-10-CM | POA: Insufficient documentation

## 2017-10-16 DIAGNOSIS — R7303 Prediabetes: Secondary | ICD-10-CM | POA: Insufficient documentation

## 2017-10-17 DIAGNOSIS — I420 Dilated cardiomyopathy: Secondary | ICD-10-CM | POA: Insufficient documentation

## 2017-10-17 DIAGNOSIS — I255 Ischemic cardiomyopathy: Secondary | ICD-10-CM | POA: Insufficient documentation

## 2017-10-17 DIAGNOSIS — F411 Generalized anxiety disorder: Secondary | ICD-10-CM | POA: Insufficient documentation

## 2017-10-19 LAB — CUP PACEART INCLINIC DEVICE CHECK
Date Time Interrogation Session: 20190313103333
Implantable Lead Implant Date: 20160202
Implantable Pulse Generator Implant Date: 20160202
MDC IDC LEAD LOCATION: 753860
MDC IDC PG SERIAL: 7229147

## 2017-11-15 DIAGNOSIS — I10 Essential (primary) hypertension: Secondary | ICD-10-CM | POA: Diagnosis not present

## 2017-11-15 DIAGNOSIS — E782 Mixed hyperlipidemia: Secondary | ICD-10-CM | POA: Diagnosis not present

## 2017-11-15 DIAGNOSIS — R7303 Prediabetes: Secondary | ICD-10-CM | POA: Diagnosis not present

## 2017-11-15 DIAGNOSIS — L509 Urticaria, unspecified: Secondary | ICD-10-CM | POA: Diagnosis not present

## 2017-11-15 DIAGNOSIS — L500 Allergic urticaria: Secondary | ICD-10-CM | POA: Diagnosis not present

## 2017-11-15 DIAGNOSIS — N529 Male erectile dysfunction, unspecified: Secondary | ICD-10-CM | POA: Diagnosis not present

## 2017-12-09 ENCOUNTER — Encounter: Payer: Self-pay | Admitting: Cardiovascular Disease

## 2017-12-09 ENCOUNTER — Ambulatory Visit (INDEPENDENT_AMBULATORY_CARE_PROVIDER_SITE_OTHER): Payer: Medicare Other | Admitting: Cardiovascular Disease

## 2017-12-09 VITALS — BP 92/64 | HR 62 | Ht 70.0 in | Wt 183.0 lb

## 2017-12-09 DIAGNOSIS — Z21 Asymptomatic human immunodeficiency virus [HIV] infection status: Secondary | ICD-10-CM

## 2017-12-09 DIAGNOSIS — I5042 Chronic combined systolic (congestive) and diastolic (congestive) heart failure: Secondary | ICD-10-CM | POA: Diagnosis not present

## 2017-12-09 DIAGNOSIS — I2510 Atherosclerotic heart disease of native coronary artery without angina pectoris: Secondary | ICD-10-CM | POA: Diagnosis not present

## 2017-12-09 DIAGNOSIS — Z8673 Personal history of transient ischemic attack (TIA), and cerebral infarction without residual deficits: Secondary | ICD-10-CM | POA: Diagnosis not present

## 2017-12-09 DIAGNOSIS — I255 Ischemic cardiomyopathy: Secondary | ICD-10-CM

## 2017-12-09 NOTE — Patient Instructions (Signed)
Medication Instructions:  Your physician recommends that you continue on your current medications as directed. Please refer to the Current Medication list given to you today.  Testing/Procedures: Your physician has requested that you have an echocardiogram. Echocardiography is a painless test that uses sound waves to create images of your heart. It provides your doctor with information about the size and shape of your heart and how well your heart's chambers and valves are working. This procedure takes approximately one hour. There are no restrictions for this procedure.  This will be done at our [facility] location:  [facility] wants you to follow-up in: 12 months with Dr. Claiborne Billings.  You will receive a reminder letter in the mail two months in advance. If you don't receive a letter, please call our office to schedule the follow-up appointment.     If you need a refill on your cardiac medications before your next appointment, please call your pharmacy.

## 2017-12-09 NOTE — Progress Notes (Signed)
Patient ID: Richard [name], male   DOB: 12/25/1955, 62 y.o.   MRN: [id number]    HPI:  Richard [name] is a 62 y.o. male who presents for a 14 month follow-up cardiology evaluation.   Richard [name] has a history of a CVA in 2003 and has been followed at the [facility] hospital.   In 2003 he underwent cardiac catheterization which  which did not reveal significant coronary artery disease. Prior to catheterization  ejection fraction was 33% with multiple areas of scar noted on a nuclear perfusion study.   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  He states that for the past year 5 years he has had undetected virus.  He has a history of hypertension, as well as hyperlipidemia.  There is also a history of depression, for which he takes Risperdal.  I initially saw him with complaints of some episodes of left-sided chest pain with walking, associated with left arm numbness and increasing shortness of breath with activity and is referred for nuclear study. He underwent a recent nuclear perfusion study on 03/01/2014.  Ejection fraction was about 27%.  This showed extensive scar multiple coronary territories without definitive reversible ischemia.  An echo Doppler study  on 03/01/2014 showed an ejection fraction of 20-25% with severe global hypokinesis, but more pronounced inferior hypokinesis.  He underwent cardiac catheterization by me on 04/02/2014 and was found to have severe  cardiomyopathy with ejection fraction of approximately 15%.  He had multivessel CAD with 80 and 90% stenoses in the first diagonal branch of the LAD, 80-90% LAD stenoses after the first septal perforating artery, and 95% apical LAD stenosis.  There is a 95% distal inferior LV branch stenosis of the distal circumflex and the RCA was totally occluded proximally with left to right collaterals.  He had normal right heart pressures and there was no evidence for pulmonary  hypertension.  He underwent surgical evaluation and was turned down.  The following day he underwent successful intervention to his diagonal and LAD by Dr. Martinique with insertion of a 3.0x38 mm Promus DES stent in the mid LAD and 3 stents placed in the diagonal vessel.  I referred him to Dr. Sallyanne Kuster for implantation of a cardio defibrillator and a single-chamber ICD was inserted on 09/10/2014.   He saw Dr. Recardo Evangelist on 12/31/2014 for follow-up evaluation. Interrogation revealed NSVT but without delivered therapy to date. He is participating in [facility] remote follow-up.  A follow-up echo Doppler study on March 22, 2016 showed an EF which had improved to 30 to 35%.  There was grade 2 diastolic dysfunction, mild MR, and RV systolic function was moderately reduced.  He is evaluated at the [facility] every 3-4 months for his HIV.  In early 2018 , he had an ICD discharge after he had run out of his metoprolol for 1 week and was shocked while sleeping.    Since I last saw him one year ago, he is remained stable from a cardiac standpoint.  He has undetected HIV virus for greater than 5 years and continues to be on treatment.  He walks on a treadmill and rides an exercise bike at least 3 to 4 days/week.  He denies chest pain or shortness of breath.  He saw Dr. Sallyanne Kuster in February 2019 for his ICD check.  Estimated longevity was 6.5 years.  He had normal lead parameters and normal device function.  He denies chest pain.  He denies PND orthopnea.  He denies any recurrent defibrillator discharge.  He essentially is asymptomatic.  He has consistently had low blood pressures.  He presents for reevaluation.   Past Medical History:  Diagnosis Date  . Coronary artery disease    a. cath 04/02/2014 3v dx 80-90% in D1, 80-90% mid LAD, 95% apical LAD, 95% distal inferior LV branch of LCx, total occlusion of prox RCA  b). Cath 04/03/2014 DES to mid LAD, DES x3 to prox D1, DAPT indefinitely. LCx lesion untreated  . HIV  (human immunodeficiency virus infection) ([place])   . Hypertension   . Ischemic cardiomyopathy 2015   EF 20-25 percent by echo in 04/2014, s/p St. Jude Fortify Assura model [id number] serial number [id number]  . Stroke [facility])     Past Surgical History:  Procedure Laterality Date  . CARDIAC CATHETERIZATION  04/02/2014   DR Claiborne Billings  . IMPLANTABLE CARDIOVERTER DEFIBRILLATOR IMPLANT N/A 09/10/2014   Procedure: IMPLANTABLE CARDIOVERTER DEFIBRILLATOR IMPLANT;  Surgeon: Sanda Klein, MD;  [facility] model [id number] serial number [id number]  . LEFT AND RIGHT HEART CATHETERIZATION WITH CORONARY ANGIOGRAM N/A 04/02/2014   Procedure: LEFT AND RIGHT HEART CATHETERIZATION WITH CORONARY ANGIOGRAM;  Surgeon: Troy Sine, MD;  Location: [facility] CATH LAB;  Service: Cardiovascular;  Laterality: N/A;  . MEDIAL COLLATERAL LIGAMENT REPAIR, KNEE Right 08/25/2015   Procedure: RIGHT THUMB RADICAL COLLATERAL LIGAMENT REPAIR;  Surgeon: Charlotte Crumb, MD;  Location: [place];  Service: Orthopedics;  Laterality: Right;  . PERCUTANEOUS CORONARY STENT INTERVENTION (PCI-S) N/A 04/03/2014   Procedure: PERCUTANEOUS CORONARY STENT INTERVENTION (PCI-S);  Surgeon: Peter M Martinique, MD;  Location: [facility] CATH LAB;  Service: Cardiovascular;  Laterality: N/A;    Allergies  Allergen Reactions  . Ioxaglate Hives  . Ivp Dye [Iodinated Diagnostic Agents] Hives    Current Outpatient Medications  Medication Sig Dispense Refill  . abacavir-lamiVUDine (EPZICOM) 600-300 MG tablet Take 1 tablet by mouth daily.    [name] aspirin 81 MG chewable tablet Chew 81 mg by mouth daily.    [name] atorvastatin (LIPITOR) 20 MG tablet Take 20 mg by mouth daily.    . diphenhydrAMINE (BENADRYL) 25 MG tablet Take 25 mg by mouth 2 (two) times daily.    . dolutegravir (TIVICAY) 50 MG tablet Take 50 mg by mouth daily.    . DULoxetine (CYMBALTA) 30 MG capsule Take 30 mg by mouth 2 (two) times daily.    . furosemide (LASIX) 20 MG tablet Take 1 tablet (20 mg total) by  mouth daily as needed. 30 tablet 3  . isosorbide mononitrate (IMDUR) 30 MG 24 hr tablet Take 0.5 tablets (15 mg total) by mouth daily. 30 tablet 6  . lisinopril (PRINIVIL,ZESTRIL) 2.5 MG tablet Take 1 tablet (2.5 mg total) by mouth at bedtime. 30 tablet 6  . loratadine (CLARITIN) 10 MG tablet Take 10 mg by mouth daily as needed for allergies.    . metoprolol (LOPRESSOR) 50 MG tablet Take 1/2 tablet twice a day. 90 tablet 3  . Omega-3 Fatty Acids (FISH OIL) 1000 MG CAPS Take 1,000 mg by mouth daily.    . pantoprazole (PROTONIX) 40 MG tablet Take 1 tablet (40 mg total) by mouth daily as needed. 30 tablet 0   No current facility-administered medications for this visit.     Socially, he is married for 27 years.  Has one child.  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  He completed 12th grade education.  There is no tobacco  use.  He does drink alcohol.  Family History  Problem Relation Age of Onset  . Heart disease Mother     ROS General: Negative; No fevers, chills, or night sweats HEENT: Negative; No changes in vision or hearing, sinus congestion, difficulty swallowing Pulmonary: Negative; No cough, wheezing, shortness of breath, hemoptysis Cardiovascular:  See HPI; No chest pain, presyncope, syncope, palpitations, edema GI: Negative; No nausea, vomiting, diarrhea, or abdominal pain GU: Negative; No dysuria, hematuria, or difficulty voiding Musculoskeletal: Negative; no myalgias, joint pain, or weakness Hematologic/Oncologic: Positive for HIV well-controlled on chronic therapy for years; no easy bruising, bleeding Endocrine: Negative; no heat/cold intolerance; no diabetes Neuro: Negative; no changes in balance, headaches Skin: Negative; No rashes or skin lesions Psychiatric: History of depression. No behavioral problems,  Sleep: Negative; No daytime sleepiness, hypersomnolence, bruxism, restless legs, hypnogagnic hallucinations Other comprehensive 14 point system  review is negative   Physical Exam BP 92/64   Pulse 62   Ht 5' 10" (1.778 m)   Wt 183 lb (83 kg)   BMI 26.26 kg/m    Repeat blood pressure by me was 98/64 supine and 90/60 standing  Wt Readings from Last 3 Encounters:  12/09/17 183 lb (83 kg)  09/29/17 194 lb 12.8 oz (88.4 kg)  11/03/16 196 lb 6.4 oz (89.1 kg)   General: Alert, oriented, no distress.  Skin: normal turgor, no rashes, warm and dry HEENT: Normocephalic, atraumatic. Pupils equal round and reactive to light; sclera anicteric; extraocular muscles intact;  Nose without nasal septal hypertrophy Mouth/Parynx benign; Mallinpatti scale 3 Neck: No JVD, no carotid bruits; normal carotid upstroke Lungs: clear to ausculatation and percussion; no wheezing or rales Chest wall: without tenderness to palpitation Heart: PMI not displaced, RRR, s1 s2 normal, 1/6 systolic murmur, no diastolic murmur, no rubs, gallops, thrills, or heaves Abdomen: soft, nontender; no hepatosplenomehaly, BS+; abdominal aorta nontender and not dilated by palpation. Back: no CVA tenderness Pulses 2+ Musculoskeletal: full range of motion, normal strength, no joint deformities Extremities: no clubbing cyanosis or edema, Homan's sign negative  Neurologic: grossly nonfocal; Cranial nerves grossly wnl Psychologic: Normal mood and affect   ECG (independently read by me): Normal sinus rhythm at 62 bpm.  Isolated PVC.  Inferior Q waves.  No ectopy.  QTc interval 401 ms  March 2018 ECG (independently read by me): Sinus rhythm at 71 bpm. Two Isolated PVCs.  Right superior axis.  T-wave changes laterally  September 2017 ECG (independently read by me): Normal sinus rhythm at 73 bpm with occasional PVC.  Right superior axis.  QTc interval 456 ms  February 2017 ECG (independently read by me):  Normal sinus rhythm at 61 bpm.  No ectopy.  Normal intervals.  QTC 404 ms.  August 2016ECG (independently read by me): Sinus rhythm at 67 bpm.  Isolated PVC.  Right  superior axis.  Anterolateral T-wave changes.  June 2016 ECG (independently read by me):  Normal sinus rhythm at 65 bpm with an occasional PVC with VA conduction  February 2016ECG (independently read by me): Normal sinus rhythm at 63 bpm.  Possible old inferior infarct.  Lateral T-wave changes.  Right superior axis  November 2015 ECG (independently read by me):  Normal sinus rhythm.  Probable old IMI.  T-wave changes V3 through V6.  ECG (independently read by me): normal sinus rhythm at 64 beats per minute.  T-wave inversion V3 through V6,  lead 1, 2 and aVF.  Prior 02/20/2014 ECG (independently read by me): Sinus rhythm with frequent PVCs  with transient trigeminy, and evidence for VA conduction with his ectopy.  LABS:  BMP Latest Ref Rng & Units 10/01/2016 09/05/2015 08/25/2015  Glucose 65 - 99 mg/dL 64(L) 90 85  BUN 7 - 25 mg/dL _0 Creatinine 0.70 - 1.33 mg/dL 1.38(H) 1.49(H) 1.46(H)  Sodium 135 - 146 mmol/L 139 135 141  Potassium 3.5 - 5.3 mmol/L 4.1 4.5 4.4  Chloride 98 - 110 mmol/L 105 101 110  CO2 20 - 31 mmol/L 24 27 21(L)  Calcium 8.6 - 10.3 mg/dL 8.9 9.2 9.1   Hepatic Function Latest Ref Rng & Units 10/01/2016 09/05/2015 09/25/2014  Total Protein 6.1 - 8.1 g/dL 7.3 7.9 7.5  Albumin 3.6 - 5.1 g/dL 4.1 4.3 4.3  AST 10 - 35 U/L _1 ALT 9 - 46 U/L _2 Alk Phosphatase 40 - 115 U/L 60 76 70  Total Bilirubin 0.2 - 1.2 mg/dL 0.6 0.8 0.7   CBC Latest Ref Rng & Units 08/25/2015 09/04/2014 04/23/2014  WBC 4.0 - 10.5 K/uL 4.5 3.1(L) 3.5(L)  Hemoglobin 13.0 - 17.0 g/dL 14.9 13.7 13.6  Hematocrit 39.0 - 52.0 % 42.8 41.6 39.4  Platelets 150 - 400 K/uL 108(L) 141(L) 148(L)   Lab Results  Component Value Date   MCV 79.7 08/25/2015   MCV 80.9 09/04/2014   MCV 78.3 04/23/2014   Lab Results  Component Value Date   TSH 0.917 09/25/2014   Lipid Panel     Component Value Date/Time   CHOL 125 09/05/2015 0848   TRIG 86 09/05/2015 0848   HDL 32 (L) 09/05/2015 0848    CHOLHDL 3.9 09/05/2015 0848   VLDL 17 09/05/2015 0848   LDLCALC 76 09/05/2015 0848    RADIOLOGY: Dg Chest 2 View  02/04/2014   CLINICAL DATA:  Chest and left arm pain.  EXAM: CHEST  2 VIEW  COMPARISON:  None.  FINDINGS: The lungs are clear. Heart size is normal. No pneumothorax or pleural effusion. No focal bony abnormality.  IMPRESSION: Negative chest.   Electronically Signed   By: Inge Rise M.D.   On: 02/04/2014 10:53   IMPRESSION:  1. Chronic combined systolic and diastolic heart failure ([place])   2. Coronary artery disease involving native coronary artery of native heart without angina pectoris   3. Cardiomyopathy, ischemic   4. History of CVA (cerebrovascular accident)   5. Asymptomatic HIV infection ([place])     ASSESSMENT AND PLAN: Richard [name] is a 62 year old, African American male who suffered a CVA in 2003.   In 2003  he was documented to have a cardiomyopathy   He developed new chest tightness and increasing shortness of breath which led to cardiac catheterization in August 2015.  His ejection fraction was 15% and he underwent successful intervention to a diffusely diseased diagonal and LAD system.  His RCA is occluded and is collateralized and he had very distal circumflex stenosis.  He has not had any recurrent anginal symptoms since his intervention.  Following revascularization LV function remained low and  he underwent prophylactic ICD implantation in light of increased sudden cardiac death risk.  An echo Doppler study in August 2017: Ejection fraction is 30-35% and there was grade 2 diastolic dysfunction.  There was mild MR.  His right ventricular function was moderately reduced.  From a cardiac standpoint, he has remained stable.  He is on furosemide 20 mg which he takes only on an as-needed basis.  He continues to be on isosorbide 15  mg in addition to lisinopril 2.5 mg at bedtime.  He also takes 25 mg twice daily.  He is entirely asymptomatic.  His blood pressure has  remained low.  For this reason he is not a candidate for [place] presently.  Since 2 years have elapsed since his last echo Doppler study, I have suggested a 2-year follow-up evaluation.  I reviewed his recent device check.  Estimated longevity is 6.5 years.  He will be going to the [place] next week for follow-up evaluation.  Since he is followed closely at the [place] as long as he remains stable I will see him in 6 to 12 months for follow-up evaluation.  Time spent: 25 minutes  Troy Sine, MD, [facility] 12/09/2017 9:06 AM

## 2017-12-20 ENCOUNTER — Ambulatory Visit (HOSPITAL_COMMUNITY): Payer: Medicare Other | Attending: Cardiology

## 2017-12-20 ENCOUNTER — Other Ambulatory Visit: Payer: Self-pay

## 2017-12-20 DIAGNOSIS — B20 Human immunodeficiency virus [HIV] disease: Secondary | ICD-10-CM | POA: Insufficient documentation

## 2017-12-20 DIAGNOSIS — I2510 Atherosclerotic heart disease of native coronary artery without angina pectoris: Secondary | ICD-10-CM | POA: Insufficient documentation

## 2017-12-20 DIAGNOSIS — I471 Supraventricular tachycardia: Secondary | ICD-10-CM | POA: Insufficient documentation

## 2017-12-20 DIAGNOSIS — I255 Ischemic cardiomyopathy: Secondary | ICD-10-CM | POA: Diagnosis not present

## 2017-12-20 DIAGNOSIS — I110 Hypertensive heart disease with heart failure: Secondary | ICD-10-CM | POA: Diagnosis not present

## 2017-12-20 DIAGNOSIS — E785 Hyperlipidemia, unspecified: Secondary | ICD-10-CM | POA: Diagnosis not present

## 2017-12-20 DIAGNOSIS — I5042 Chronic combined systolic (congestive) and diastolic (congestive) heart failure: Secondary | ICD-10-CM

## 2017-12-22 ENCOUNTER — Telehealth: Payer: Self-pay | Admitting: Cardiovascular Disease

## 2017-12-22 NOTE — Telephone Encounter (Signed)
Called patient and LVM to call back to schedule followup in September 2019.

## 2017-12-29 ENCOUNTER — Telehealth: Payer: Self-pay

## 2017-12-29 ENCOUNTER — Encounter: Payer: Medicare Other | Admitting: *Deleted

## 2017-12-29 NOTE — Telephone Encounter (Signed)
[facility]OV[facility] reminding pt to send remote transmission.   

## 2017-12-30 ENCOUNTER — Encounter: Payer: Self-pay | Admitting: Cardiology

## 2018-01-05 ENCOUNTER — Ambulatory Visit (INDEPENDENT_AMBULATORY_CARE_PROVIDER_SITE_OTHER): Payer: Medicare Other | Admitting: *Deleted

## 2018-01-05 DIAGNOSIS — I255 Ischemic cardiomyopathy: Secondary | ICD-10-CM

## 2018-01-06 NOTE — Progress Notes (Signed)
Remote ICD transmission.   

## 2018-01-09 ENCOUNTER — Encounter: Payer: Self-pay | Admitting: Cardiology

## 2018-01-25 LAB — CUP PACEART REMOTE DEVICE CHECK
Date Time Interrogation Session: 20190619113945
MDC IDC LEAD IMPLANT DT: 20160202
MDC IDC LEAD LOCATION: 753860
MDC IDC PG IMPLANT DT: 20160202
MDC IDC PG SERIAL: 7229147

## 2018-01-30 DIAGNOSIS — L500 Allergic urticaria: Secondary | ICD-10-CM | POA: Diagnosis not present

## 2018-01-30 DIAGNOSIS — I10 Essential (primary) hypertension: Secondary | ICD-10-CM | POA: Diagnosis not present

## 2018-01-30 DIAGNOSIS — J00 Acute nasopharyngitis [common cold]: Secondary | ICD-10-CM | POA: Diagnosis not present

## 2018-01-30 DIAGNOSIS — N183 Chronic kidney disease, stage 3 (moderate): Secondary | ICD-10-CM | POA: Diagnosis not present

## 2018-02-14 DIAGNOSIS — I10 Essential (primary) hypertension: Secondary | ICD-10-CM | POA: Diagnosis not present

## 2018-02-14 DIAGNOSIS — N183 Chronic kidney disease, stage 3 (moderate): Secondary | ICD-10-CM | POA: Diagnosis not present

## 2018-02-14 DIAGNOSIS — T781XXD Other adverse food reactions, not elsewhere classified, subsequent encounter: Secondary | ICD-10-CM | POA: Diagnosis not present

## 2018-02-14 DIAGNOSIS — I5022 Chronic systolic (congestive) heart failure: Secondary | ICD-10-CM | POA: Diagnosis not present

## 2018-02-14 DIAGNOSIS — D696 Thrombocytopenia, unspecified: Secondary | ICD-10-CM | POA: Diagnosis not present

## 2018-02-14 DIAGNOSIS — R7303 Prediabetes: Secondary | ICD-10-CM | POA: Diagnosis not present

## 2018-02-24 ENCOUNTER — Telehealth: Payer: Self-pay | Admitting: Cardiovascular Disease

## 2018-02-24 MED ORDER — ISOSORBIDE MONONITRATE ER 30 MG PO TB24: 15.0000 mg | ORAL_TABLET | Freq: Every day | ORAL | 6 refills | Status: DC

## 2018-02-24 NOTE — Telephone Encounter (Signed)
New Message:      Pt c/o medication issue:  1. Name of Medication: isosorbide mononitrate (IMDUR) 30 MG 24 hr tablet  2. How are you currently taking this medication (dosage and times per day)? Take 0.5 tablets (15 mg total) by mouth daily.  3. Are you having a reaction (difficulty breathing--STAT)? No  4. What is your medication issue? Pt states this medication is no longer showing in the [place] system. Pt states how is he supposed to get this medication. Pt also states Dr. Claiborne Billings stated he wanted to put pt on a new medication but his BP was too low at the time.

## 2018-02-24 NOTE — Telephone Encounter (Signed)
Spoke with patient who states the [place] does not have an active prescription for his imdur and his [place] doctor is out. Explained I can send to local pharmacy so he will not run out. Rx(s) sent to pharmacy electronically. Suggested that he ask for printed Rx(s) at his Sept appt with MD so he can take to MD.   He also inquired about the med MD wanted to start him on at last visit but BP was too low. Per chart review, this was entresto. He does not have a current list of BP readings. Suggested that he monitor BP between now and next appointment for MD to review and determine if he can start on this.

## 2018-04-03 ENCOUNTER — Other Ambulatory Visit: Payer: Self-pay | Admitting: Internal Medicine

## 2018-04-03 ENCOUNTER — Ambulatory Visit
Admission: RE | Admit: 2018-04-03 | Discharge: 2018-04-03 | Disposition: A | Discharge status: Home / Self Care | Payer: Medicare Other | Source: Ambulatory Visit | Attending: Internal Medicine | Admitting: Internal Medicine

## 2018-04-03 DIAGNOSIS — T781XXD Other adverse food reactions, not elsewhere classified, subsequent encounter: Secondary | ICD-10-CM | POA: Diagnosis not present

## 2018-04-03 DIAGNOSIS — R059 Cough, unspecified: Secondary | ICD-10-CM

## 2018-04-03 DIAGNOSIS — K219 Gastro-esophageal reflux disease without esophagitis: Secondary | ICD-10-CM | POA: Diagnosis not present

## 2018-04-03 DIAGNOSIS — I5022 Chronic systolic (congestive) heart failure: Secondary | ICD-10-CM | POA: Diagnosis not present

## 2018-04-03 DIAGNOSIS — R05 Cough: Secondary | ICD-10-CM | POA: Diagnosis not present

## 2018-04-03 DIAGNOSIS — I255 Ischemic cardiomyopathy: Secondary | ICD-10-CM | POA: Diagnosis not present

## 2018-04-06 ENCOUNTER — Ambulatory Visit (INDEPENDENT_AMBULATORY_CARE_PROVIDER_SITE_OTHER): Payer: Medicare Other | Admitting: *Deleted

## 2018-04-06 DIAGNOSIS — I255 Ischemic cardiomyopathy: Secondary | ICD-10-CM | POA: Diagnosis not present

## 2018-04-06 NOTE — Progress Notes (Signed)
Remote ICD transmission.   

## 2018-04-19 DIAGNOSIS — R05 Cough: Secondary | ICD-10-CM | POA: Diagnosis not present

## 2018-04-19 DIAGNOSIS — F329 Major depressive disorder, single episode, unspecified: Secondary | ICD-10-CM | POA: Diagnosis not present

## 2018-04-19 DIAGNOSIS — K449 Diaphragmatic hernia without obstruction or gangrene: Secondary | ICD-10-CM | POA: Insufficient documentation

## 2018-04-19 DIAGNOSIS — I5022 Chronic systolic (congestive) heart failure: Secondary | ICD-10-CM | POA: Diagnosis not present

## 2018-04-19 DIAGNOSIS — N183 Chronic kidney disease, stage 3 (moderate): Secondary | ICD-10-CM | POA: Diagnosis not present

## 2018-04-19 DIAGNOSIS — K219 Gastro-esophageal reflux disease without esophagitis: Secondary | ICD-10-CM | POA: Diagnosis not present

## 2018-04-20 ENCOUNTER — Telehealth: Payer: Self-pay | Admitting: Cardiovascular Disease

## 2018-04-20 NOTE — Telephone Encounter (Signed)
Returned call to patient. PCP stopped lisinopril and started him on losartan 25mg  QD. Med & allergy list updated.

## 2018-04-20 NOTE — Telephone Encounter (Signed)
New Message    Pt c/o medication issue:  1. Name of Medication: lisinopril (PRINIVIL,ZESTRIL) 2.5 MG tablet  2. How are you currently taking this medication (dosage and times per day)?   3. Are you having a reaction (difficulty breathing--STAT)?   4. What is your medication issue? Patient is calling because he was taking lisinopril and he had developed a cough. His PCP changed him to Losartan and he just wanted to let us know.

## 2018-04-24 NOTE — Progress Notes (Signed)
Cardiology Office Note:    Date:  04/26/2018   ID:  Richard [name], DOB 1956/05/22, MRN [id number]  PCP:  System, Provider Not In  Cardiologist:  Shelva Majestic, MD   Referring MD: No ref. provider found   Chief Complaint  Patient presents with  . Follow-up  CHF  History of Present Illness:    Richard [name] is a 62 y.o. male with a hx of HTN, ischemic cardiomyopathy, CAD, HIV, HLD, hx of CVA in 2003, and depression.  He underwent heart catheterization in 2003 that was negative for significant coronary artery disease.  Prior to heart cath, his LV EF was 33% with multiple areas of scar noted on a nuclear perfusion study.  He has a history of HIV contracted in the early 1990s.  He has had undetectable viral load for the past 5 years.  He had a repeat nuclear perfusion study on 03/01/2014 that again showed extensive scar without a definitive reversible ischemia.  Follow-up echo in 2015 with an ejection fraction of 20 to 25% and severe global hypokinesis.  He had a heart cath on 04/02/2014 with multivessel CAD.  Fortunately he was turned down for CABG.  He had a staged PCI of his diagonal and LAD with insertion of DES in the LAD and 3 stents placed in the diagonal vessel.  ICD was implanted by Dr. Sallyanne Kuster on 12/31/1998 for persistent chronic heart failure.  He last saw Dr. Claiborne Billings in clinic on 12/09/2017 and was doing well at that visit.  Heart failure medications have been titrated.  He is not able to take Entresto due to low blood pressures.  He recently developed a cough on lisinopril and was changed to losartan by his PCP at the [place].  He returns today for clinic follow-up.  He is doing much better on the losartan.  He can no longer tolerate an ACE inhibitor due to significant cough.  Blood pressure has increased slightly to 120/66 from his baseline of systolic in the 01U to low 100s.  He is feeling much better with this change.  He has not had to take PRN Lasix since January of this year.  He is  euvolemic on exam.  He is doing well on his current regimen and I do not wish to titrate any medications at this time.  He is concerned about some erectile dysfunction and questions if any of his medications are contributing to this.  We discussed his Lopressor dose.  He would like to remain on his current medications at this time but will discuss possibly decreasing Lopressor at his next follow-up visit.  Past Medical History:  Diagnosis Date  . Coronary artery disease    a. cath 04/02/2014 3v dx 80-90% in D1, 80-90% mid LAD, 95% apical LAD, 95% distal inferior LV branch of LCx, total occlusion of prox RCA  b). Cath 04/03/2014 DES to mid LAD, DES x3 to prox D1, DAPT indefinitely. LCx lesion untreated  . HIV (human immunodeficiency virus infection) ([place])   . Hypertension   . Ischemic cardiomyopathy 2015   EF 20-25 percent by echo in 04/2014, s/p [employer] Fortify Assura model [id number] serial number [id number]  . Stroke [facility])     Past Surgical History:  Procedure Laterality Date  . CARDIAC CATHETERIZATION  04/02/2014   DR Claiborne Billings  . IMPLANTABLE CARDIOVERTER DEFIBRILLATOR IMPLANT N/A 09/10/2014   Procedure: IMPLANTABLE CARDIOVERTER DEFIBRILLATOR IMPLANT;  Surgeon: Sanda Klein, MD;  [facility] model [id number] serial number [id number]  .  LEFT AND RIGHT HEART CATHETERIZATION WITH CORONARY ANGIOGRAM N/A 04/02/2014   Procedure: LEFT AND RIGHT HEART CATHETERIZATION WITH CORONARY ANGIOGRAM;  Surgeon: Troy Sine, MD;  Location[facility] CATH LAB;  Service: Cardiovascular;  Laterality: N/A;  . MEDIAL COLLATERAL LIGAMENT REPAIR, KNEE Right 08/25/2015   Procedure: RIGHT THUMB RADICAL COLLATERAL LIGAMENT REPAIR;  Surgeon: Charlotte Crumb, MD;  Location: [place];  Service: Orthopedics;  Laterality: Right;  . PERCUTANEOUS CORONARY STENT INTERVENTION (PCI-S) N/A 04/03/2014   Procedure: PERCUTANEOUS CORONARY STENT INTERVENTION (PCI-S);  Surgeon: Peter M Martinique, MD;  Location: [facility] CATH LAB;  Service:  Cardiovascular;  Laterality: N/A;    Current Medications: Current Meds  Medication Sig  . abacavir-lamiVUDine (EPZICOM) 600-300 MG tablet Take 1 tablet by mouth daily.  [name] aspirin 81 MG chewable tablet Chew 81 mg by mouth daily.  [name] atorvastatin (LIPITOR) 20 MG tablet Take 20 mg by mouth daily.  . diphenhydrAMINE (BENADRYL) 25 MG tablet Take 25 mg by mouth 2 (two) times daily.  . dolutegravir (TIVICAY) 50 MG tablet Take 50 mg by mouth daily.  . DULoxetine (CYMBALTA) 30 MG capsule Take 30 mg by mouth 2 (two) times daily.  . furosemide (LASIX) 20 MG tablet Take 1 tablet (20 mg total) by mouth daily as needed.  . isosorbide mononitrate (IMDUR) 30 MG 24 hr tablet Take 0.5 tablets (15 mg total) by mouth daily.  [name] loratadine (CLARITIN) 10 MG tablet Take 10 mg by mouth daily as needed for allergies.  [name] losartan (COZAAR) 25 MG tablet Take 25 mg by mouth daily.  . metoprolol (LOPRESSOR) 50 MG tablet Take 1/2 tablet twice a day.  . pantoprazole (PROTONIX) 40 MG tablet Take 1 tablet (40 mg total) by mouth daily as needed.     Allergies:   Lisinopril; Ioxaglate; and Ivp dye [iodinated diagnostic agents]   Social History   Socioeconomic History  . Marital status: Married    Spouse name: Not on file  . Number of children: Not on file  . Years of education: Not on file  . Highest education level: Not on file  Occupational History  . Not on file  Social Needs  . Financial resource strain: Not on file  . Food insecurity:    Worry: Not on file    Inability: Not on file  . Transportation needs:    Medical: Not on file    Non-medical: Not on file  Tobacco Use  . Smoking status: Never Smoker  . Smokeless tobacco: Never Used  Substance and Sexual Activity  . Alcohol use: No  . Drug use: No  . Sexual activity: Not on file  Lifestyle  . Physical activity:    Days per week: Not on file    Minutes per session: Not on file  . Stress: Not on file  Relationships  . Social connections:    Talks  on phone: Not on file    Gets together: Not on file    Attends religious service: Not on file    Active member of club or organization: Not on file    Attends meetings of clubs or organizations: Not on file    Relationship status: Not on file  Other Topics Concern  . Not on file  Social History Narrative  . Not on file     Family History: The patient's family history includes Heart disease in his mother.  ROS:   Please see the history of present illness.     All other systems reviewed and are  negative.  EKGs/Labs/Other Studies Reviewed:    The following studies were reviewed today:  Echo 12/20/17: Study Conclusions  - Left ventricle: The cavity size was mildly dilated. Systolic   function was severely reduced. The estimated ejection fraction   was in the range of 25% to 30%. Severe diffuse hypokinesis.   Features are consistent with a pseudonormal left ventricular   filling pattern, with concomitant abnormal relaxation and   increased filling pressure (grade 2 diastolic dysfunction).   Doppler parameters are consistent with high ventricular filling   pressure. - Aortic valve: Trileaflet; normal thickness, mildly calcified   leaflets. - Mitral valve: There was mild regurgitation. - Tricuspid valve: There was trivial regurgitation.  EKG:  EKG is not ordered today.    Recent Labs: No results found for requested labs within last 8760 hours.  Recent Lipid Panel    Component Value Date/Time   CHOL 125 09/05/2015 0848   TRIG 86 09/05/2015 0848   HDL 32 (L) 09/05/2015 0848   CHOLHDL 3.9 09/05/2015 0848   VLDL 17 09/05/2015 0848   LDLCALC 76 09/05/2015 0848    Physical Exam:    VS:  BP 120/66   Pulse (!) 56   Resp 12   Ht 5\' 10"  (1.778 m)   Wt 185 lb 9.6 oz (84.2 kg)   BMI 26.63 kg/m     Wt Readings from Last 3 Encounters:  04/26/18 185 lb 9.6 oz (84.2 kg)  12/09/17 183 lb (83 kg)  09/29/17 194 lb 12.8 oz (88.4 kg)     GEN: Well nourished, well developed  in no acute distress HEENT: Normal NECK: No JVD; No carotid bruits CARDIAC: RRR, no murmurs, rubs, gallops RESPIRATORY:  Clear to auscultation without rales, wheezing or rhonchi  ABDOMEN: Soft, non-tender, non-distended MUSCULOSKELETAL:  No edema; No deformity  SKIN: Warm and dry NEUROLOGIC:  Alert and oriented x 3 PSYCHIATRIC:  Normal affect   ASSESSMENT:    1. Cardiomyopathy, ischemic   2. Chronic combined systolic (congestive) and diastolic (congestive) heart failure (HCC)   3. 3-vessel coronary artery disease   4. Hyperlipidemia LDL goal <70   5. ICD (implantable cardioverter-defibrillator) in place    PLAN:    In order of problems listed above:  Cardiomyopathy, ischemic Chronic combined systolic (congestive) and diastolic (congestive) heart failure (HCC) 3-vessel coronary artery disease He is euvolemic on exam and has had to take no Lasix since early January of this year.  He denies anginal symptoms.  Overall he is doing well from a cardiac standpoint.  He has switched from lisinopril to losartan due to chronic cough.  He is feeling much better after this change.  Hyperlipidemia LDL goal <70 PCP follows his cholesterol.  Continue statin.  ICD (implantable cardioverter-defibrillator) in place No therapy delivered.  He does have concerns about erectile dysfunction and questions medication side effects.  He does not wish to make any medication changes at this time and will inquire about medications at his next visit.  Follow-up with Dr. Claiborne Billings in January 2020.  Medication Adjustments/Labs and Tests Ordered: Current medicines are reviewed at length with the patient today.  Concerns regarding medicines are outlined above.  No orders of the defined types were placed in this encounter.  No orders of the defined types were placed in this encounter.   Signed, Ledora Bottcher, [place]  04/26/2018 3:49 PM    [facility]

## 2018-04-25 ENCOUNTER — Ambulatory Visit: Payer: Medicare Other | Admitting: Cardiovascular Disease

## 2018-04-26 ENCOUNTER — Ambulatory Visit (INDEPENDENT_AMBULATORY_CARE_PROVIDER_SITE_OTHER): Payer: Medicare Other | Admitting: Physician Assistant

## 2018-04-26 ENCOUNTER — Encounter: Payer: Self-pay | Admitting: Physician Assistant

## 2018-04-26 VITALS — BP 120/66 | HR 56 | Resp 12 | Ht 70.0 in | Wt 185.6 lb

## 2018-04-26 DIAGNOSIS — Z95810 Presence of automatic (implantable) cardiac defibrillator: Secondary | ICD-10-CM

## 2018-04-26 DIAGNOSIS — I2510 Atherosclerotic heart disease of native coronary artery without angina pectoris: Secondary | ICD-10-CM | POA: Diagnosis not present

## 2018-04-26 DIAGNOSIS — E785 Hyperlipidemia, unspecified: Secondary | ICD-10-CM | POA: Diagnosis not present

## 2018-04-26 DIAGNOSIS — I5042 Chronic combined systolic (congestive) and diastolic (congestive) heart failure: Secondary | ICD-10-CM

## 2018-04-26 DIAGNOSIS — I255 Ischemic cardiomyopathy: Secondary | ICD-10-CM

## 2018-04-26 NOTE — Patient Instructions (Signed)
Medication Instructions:  None Ordered. If you need a refill on your cardiac medications before your next appointment, please call your pharmacy.  Labwork: None Ordered.  Testing/Procedures: None Ordered.  Follow-Up: Your physician wants you to follow-up in: January with Dr.Kelly.    Thank you for choosing [facility] at [facility]!!

## 2018-05-02 DIAGNOSIS — R7303 Prediabetes: Secondary | ICD-10-CM | POA: Diagnosis not present

## 2018-05-02 DIAGNOSIS — R05 Cough: Secondary | ICD-10-CM | POA: Diagnosis not present

## 2018-05-02 DIAGNOSIS — E782 Mixed hyperlipidemia: Secondary | ICD-10-CM | POA: Diagnosis not present

## 2018-05-02 DIAGNOSIS — Z23 Encounter for immunization: Secondary | ICD-10-CM | POA: Diagnosis not present

## 2018-05-02 DIAGNOSIS — K219 Gastro-esophageal reflux disease without esophagitis: Secondary | ICD-10-CM | POA: Diagnosis not present

## 2018-05-02 DIAGNOSIS — N183 Chronic kidney disease, stage 3 (moderate): Secondary | ICD-10-CM | POA: Diagnosis not present

## 2018-05-02 DIAGNOSIS — B20 Human immunodeficiency virus [HIV] disease: Secondary | ICD-10-CM | POA: Diagnosis not present

## 2018-05-02 DIAGNOSIS — I255 Ischemic cardiomyopathy: Secondary | ICD-10-CM | POA: Diagnosis not present

## 2018-05-03 DIAGNOSIS — E782 Mixed hyperlipidemia: Secondary | ICD-10-CM | POA: Diagnosis not present

## 2018-05-03 DIAGNOSIS — B20 Human immunodeficiency virus [HIV] disease: Secondary | ICD-10-CM | POA: Diagnosis not present

## 2018-05-03 DIAGNOSIS — N183 Chronic kidney disease, stage 3 (moderate): Secondary | ICD-10-CM | POA: Diagnosis not present

## 2018-05-08 LAB — CUP PACEART REMOTE DEVICE CHECK
Brady Statistic RV Percent Paced: 1 %
HighPow Impedance: 63 Ohm
HighPow Impedance: 63 Ohm
Implantable Lead Implant Date: 20160202
Implantable Lead Location: 753860
Implantable Pulse Generator Implant Date: 20160202
Lead Channel Impedance Value: 360 Ohm
Lead Channel Pacing Threshold Amplitude: 1 V
Lead Channel Pacing Threshold Pulse Width: 0.5 ms
Lead Channel Setting Pacing Pulse Width: 0.5 ms
Lead Channel Setting Sensing Sensitivity: 0.5 mV
MDC IDC MSMT BATTERY REMAINING LONGEVITY: 75 mo
MDC IDC MSMT BATTERY REMAINING PERCENTAGE: 67 %
MDC IDC MSMT BATTERY VOLTAGE: 3.01 V
MDC IDC MSMT LEADCHNL RV SENSING INTR AMPL: 10.6 mV
MDC IDC SESS DTM: 20190829080015
MDC IDC SET LEADCHNL RV PACING AMPLITUDE: 2.5 V
Pulse Gen Serial Number: 7229147

## 2018-05-24 DIAGNOSIS — I5022 Chronic systolic (congestive) heart failure: Secondary | ICD-10-CM | POA: Diagnosis not present

## 2018-05-24 DIAGNOSIS — R05 Cough: Secondary | ICD-10-CM | POA: Diagnosis not present

## 2018-07-11 ENCOUNTER — Ambulatory Visit (INDEPENDENT_AMBULATORY_CARE_PROVIDER_SITE_OTHER): Payer: Medicare Other

## 2018-07-11 DIAGNOSIS — I255 Ischemic cardiomyopathy: Secondary | ICD-10-CM

## 2018-07-11 NOTE — Progress Notes (Signed)
Remote ICD transmission.   

## 2018-07-26 ENCOUNTER — Telehealth: Payer: Self-pay | Admitting: Cardiovascular Disease

## 2018-07-26 DIAGNOSIS — R066 Hiccough: Secondary | ICD-10-CM | POA: Diagnosis not present

## 2018-07-26 DIAGNOSIS — R1011 Right upper quadrant pain: Secondary | ICD-10-CM | POA: Diagnosis not present

## 2018-07-26 NOTE — Telephone Encounter (Signed)
aknowledged

## 2018-07-26 NOTE — Telephone Encounter (Signed)
Spoke with patient and he c/o that he has been having problems with gas in the middle of his stomach for the past 2 months. Patient said that he has discussed this with his PCP already and has been using gas-x with no relief. Patient wanted to know if his atorvastatin could be causing this. Patient says he has been taking atorvastatin for 2 years. Advised patient that gas is not a typical adverse reaction to atorvastatin and that he should contact his PCP again for further advice. Verbalized understanding of plan.

## 2018-07-26 NOTE — Telephone Encounter (Signed)
New Message          Pt c/o medication issue:  1. Name of Medication: Atvastatin 20 mg  2. How are you currently taking this medication (dosage and times per day)? 1 at bed time  3. Are you having a reaction (difficulty breathing--STAT)? Having bad gas  4. What is your medication issue? Patient is complaining about bad gas.

## 2018-07-31 ENCOUNTER — Other Ambulatory Visit: Payer: Self-pay

## 2018-07-31 DIAGNOSIS — R1011 Right upper quadrant pain: Principal | ICD-10-CM

## 2018-07-31 DIAGNOSIS — G8929 Other chronic pain: Secondary | ICD-10-CM

## 2018-08-07 ENCOUNTER — Ambulatory Visit
Admission: RE | Admit: 2018-08-07 | Discharge: 2018-08-07 | Disposition: A | Discharge status: Home / Self Care | Payer: Medicare Other | Source: Ambulatory Visit | Attending: Internal Medicine | Admitting: Internal Medicine

## 2018-08-07 DIAGNOSIS — R1011 Right upper quadrant pain: Principal | ICD-10-CM

## 2018-08-07 DIAGNOSIS — G8929 Other chronic pain: Secondary | ICD-10-CM

## 2018-08-21 DIAGNOSIS — R05 Cough: Secondary | ICD-10-CM | POA: Diagnosis not present

## 2018-08-21 DIAGNOSIS — I5022 Chronic systolic (congestive) heart failure: Secondary | ICD-10-CM | POA: Diagnosis not present

## 2018-08-21 DIAGNOSIS — R1319 Other dysphagia: Secondary | ICD-10-CM | POA: Diagnosis not present

## 2018-08-21 DIAGNOSIS — E785 Hyperlipidemia, unspecified: Secondary | ICD-10-CM | POA: Diagnosis not present

## 2018-08-21 DIAGNOSIS — N183 Chronic kidney disease, stage 3 (moderate): Secondary | ICD-10-CM | POA: Diagnosis not present

## 2018-08-21 DIAGNOSIS — Z0000 Encounter for general adult medical examination without abnormal findings: Secondary | ICD-10-CM | POA: Diagnosis not present

## 2018-08-21 DIAGNOSIS — R7303 Prediabetes: Secondary | ICD-10-CM | POA: Diagnosis not present

## 2018-08-21 DIAGNOSIS — K219 Gastro-esophageal reflux disease without esophagitis: Secondary | ICD-10-CM | POA: Diagnosis not present

## 2018-08-28 ENCOUNTER — Other Ambulatory Visit: Payer: Self-pay | Admitting: Gastroenterology

## 2018-08-28 ENCOUNTER — Telehealth: Payer: Self-pay

## 2018-08-28 DIAGNOSIS — K219 Gastro-esophageal reflux disease without esophagitis: Secondary | ICD-10-CM | POA: Diagnosis not present

## 2018-08-28 DIAGNOSIS — I5020 Unspecified systolic (congestive) heart failure: Secondary | ICD-10-CM | POA: Diagnosis not present

## 2018-08-28 DIAGNOSIS — R1310 Dysphagia, unspecified: Secondary | ICD-10-CM | POA: Diagnosis not present

## 2018-08-28 NOTE — Telephone Encounter (Signed)
   [facility] Pre-operative Risk Assessment    Request for surgical clearance:  1. What type of surgery is being performed? EGD with dilation  2. When is this surgery scheduled? 09/07/18  3. What type of clearance is required (medical clearance vs. Pharmacy clearance to hold med vs. Both)? Both  4. Are there any medications that need to be held prior to surgery and how long? Aspirin  5. Practice name and name of physician performing surgery? [facility]  6. What is your office phone number [phone]   7.   What is your office fax number [phone]  8.   Anesthesia type not listed   Kathyrn Lass 08/28/2018, 1:55 PM  _________________________________________________________________   (provider comments below)

## 2018-08-30 NOTE — Telephone Encounter (Signed)
LMTCB

## 2018-08-30 NOTE — Telephone Encounter (Signed)
He can hold ASA for 5-7 days before the EGD MCr

## 2018-08-30 NOTE — Telephone Encounter (Signed)
   Primary Cardiologist: Shelva Majestic, MD  Chart reviewed as part of pre-operative protocol coverage. Patient was contacted 08/30/2018 in reference to pre-operative risk assessment for pending surgery as outlined below.  Richard [name] was last seen on 04/26/2018 by Fabian Sharp, PA-C.  Since that day, Richard [name] has done well from a cardiac standpoint. He reports walking 2 miles on the treadmill 4 days per week. He does not experience anginal symptoms with this activity. No complaints of chest pain, SOB, DOE, LE edema, dizziness, lightheadedness, or syncope.  Therefore, based on ACC/AHA guidelines, the patient would be at acceptable risk for the planned procedure without further cardiovascular testing.   Dr. Sallyanne Kuster reviewed the patients chart and per his recommendation, patient can hold aspirin 5-7 days prior to his upcoming endoscopic procedure.  I will route this recommendation to the requesting party via Epic fax function and remove from pre-op pool.  Please call with questions.  Abigail Butts, PA-C 08/30/2018, 3:30 PM

## 2018-08-30 NOTE — Telephone Encounter (Signed)
   Primary Cardiologist: Shelva Majestic, MD  Chart reviewed as part of pre-operative protocol coverage. He has a PMH of CAD with last intervention in 2015 (declined for CABG and underwent staged PCI to LAD and diagonal vessel).  Dr. Claiborne Billings, can you comment on whether this patient can hold his aspirin, and if yes, for how long? He has an esophageal dilation procedure scheduled for 09/07/2018.   Abigail Butts, PA-C 08/30/2018, 1:15 PM

## 2018-08-31 LAB — CUP PACEART REMOTE DEVICE CHECK
Battery Remaining Longevity: 73 mo
Battery Remaining Percentage: 65 %
Brady Statistic RV Percent Paced: 1 %
Date Time Interrogation Session: 20191203074940
HighPow Impedance: 59 Ohm
HighPow Impedance: 59 Ohm
Implantable Lead Implant Date: 20160202
Implantable Lead Location: 753860
Implantable Pulse Generator Implant Date: 20160202
Lead Channel Impedance Value: 360 Ohm
Lead Channel Pacing Threshold Amplitude: 1 V
Lead Channel Sensing Intrinsic Amplitude: 11.5 mV
Lead Channel Setting Pacing Amplitude: 2.5 V
Lead Channel Setting Pacing Pulse Width: 0.5 ms
Lead Channel Setting Sensing Sensitivity: 0.5 mV
MDC IDC MSMT BATTERY VOLTAGE: 3.01 V
MDC IDC MSMT LEADCHNL RV PACING THRESHOLD PULSEWIDTH: 0.5 ms
Pulse Gen Serial Number: 7229147

## 2018-09-07 ENCOUNTER — Encounter (HOSPITAL_COMMUNITY): Payer: Self-pay | Admitting: *Deleted

## 2018-09-07 ENCOUNTER — Other Ambulatory Visit: Payer: Self-pay

## 2018-09-07 NOTE — Progress Notes (Signed)
SPOKE WITH SPOUSE VERONICA. PER VERONICA  PATIENT JUST LEFT HOME , WILL BE AVAILABLE IN THE NEXT 1-1.5 HOURS. SHE VERBALIZED AWARENESS THAT  THIS CALL IS CONCERNING ENDO PROCEDURE TOMORROW. RN LEFT CALL BACK NUMBER WITH SPOUSE.

## 2018-09-08 ENCOUNTER — Ambulatory Visit (HOSPITAL_COMMUNITY)
Admission: RE | Admit: 2018-09-08 | Discharge: 2018-09-08 | Disposition: A | Discharge status: Home / Self Care | Payer: Medicare Other | Attending: Gastroenterology | Admitting: Gastroenterology

## 2018-09-08 ENCOUNTER — Ambulatory Visit (HOSPITAL_COMMUNITY): Payer: Medicare Other | Admitting: Anesthesiology

## 2018-09-08 ENCOUNTER — Other Ambulatory Visit: Payer: Self-pay

## 2018-09-08 ENCOUNTER — Encounter (HOSPITAL_COMMUNITY)
Admission: RE | Disposition: A | Discharge status: Home / Self Care | Payer: Self-pay | Source: Home / Self Care | Attending: Gastroenterology

## 2018-09-08 DIAGNOSIS — K222 Esophageal obstruction: Secondary | ICD-10-CM | POA: Insufficient documentation

## 2018-09-08 DIAGNOSIS — Z21 Asymptomatic human immunodeficiency virus [HIV] infection status: Secondary | ICD-10-CM | POA: Insufficient documentation

## 2018-09-08 DIAGNOSIS — I2510 Atherosclerotic heart disease of native coronary artery without angina pectoris: Secondary | ICD-10-CM | POA: Diagnosis not present

## 2018-09-08 DIAGNOSIS — K210 Gastro-esophageal reflux disease with esophagitis: Secondary | ICD-10-CM | POA: Diagnosis not present

## 2018-09-08 DIAGNOSIS — I10 Essential (primary) hypertension: Secondary | ICD-10-CM | POA: Diagnosis not present

## 2018-09-08 DIAGNOSIS — K449 Diaphragmatic hernia without obstruction or gangrene: Secondary | ICD-10-CM | POA: Insufficient documentation

## 2018-09-08 DIAGNOSIS — Z955 Presence of coronary angioplasty implant and graft: Secondary | ICD-10-CM | POA: Diagnosis not present

## 2018-09-08 DIAGNOSIS — Z8673 Personal history of transient ischemic attack (TIA), and cerebral infarction without residual deficits: Secondary | ICD-10-CM | POA: Diagnosis not present

## 2018-09-08 DIAGNOSIS — K209 Esophagitis, unspecified: Secondary | ICD-10-CM | POA: Diagnosis not present

## 2018-09-08 DIAGNOSIS — R1310 Dysphagia, unspecified: Secondary | ICD-10-CM | POA: Diagnosis not present

## 2018-09-08 DIAGNOSIS — Z95810 Presence of automatic (implantable) cardiac defibrillator: Secondary | ICD-10-CM | POA: Diagnosis not present

## 2018-09-08 HISTORY — PX: ESOPHAGOGASTRODUODENOSCOPY (EGD) WITH PROPOFOL: SHX5813

## 2018-09-08 HISTORY — PX: SAVORY DILATION: SHX5439

## 2018-09-08 SURGERY — ESOPHAGOGASTRODUODENOSCOPY (EGD) WITH PROPOFOL
Anesthesia: Monitor Anesthesia Care

## 2018-09-08 MED ORDER — PROPOFOL 10 MG/ML IV BOLUS
INTRAVENOUS | Status: AC
  Filled 2018-09-08: qty 40

## 2018-09-08 MED ORDER — LACTATED RINGERS IV SOLN
INTRAVENOUS | Status: DC | PRN
  Administered 2018-09-08: 09:00:00 via INTRAVENOUS

## 2018-09-08 MED ORDER — LIDOCAINE 2% (20 MG/ML) 5 ML SYRINGE
INTRAMUSCULAR | Status: DC | PRN
  Administered 2018-09-08: 100 mg via INTRAVENOUS

## 2018-09-08 MED ORDER — PROPOFOL 10 MG/ML IV BOLUS
INTRAVENOUS | Status: DC | PRN
  Administered 2018-09-08: 30 mg via INTRAVENOUS

## 2018-09-08 MED ORDER — PROPOFOL 500 MG/50ML IV EMUL
INTRAVENOUS | Status: DC | PRN
  Administered 2018-09-08: 150 ug/kg/min via INTRAVENOUS

## 2018-09-08 MED ORDER — SODIUM CHLORIDE 0.9 % IV SOLN: INTRAVENOUS | Status: DC

## 2018-09-08 SURGICAL SUPPLY — 14 items

## 2018-09-08 NOTE — Anesthesia Postprocedure Evaluation (Signed)
Anesthesia Post Note  Patient: Vipul A Montijo  Procedure(s) Performed: ESOPHAGOGASTRODUODENOSCOPY (EGD) WITH PROPOFOL (N/A ) SAVORY DILATION (N/A )     Patient location during evaluation: Endoscopy Anesthesia Type: MAC Level of consciousness: awake and alert Pain management: pain level controlled Vital Signs Assessment: post-procedure vital signs reviewed and stable Respiratory status: spontaneous breathing, nonlabored ventilation, respiratory function stable and patient connected to nasal cannula oxygen Cardiovascular status: stable and blood pressure returned to baseline Postop Assessment: no apparent nausea or vomiting Anesthetic complications: no    Last Vitals:  Vitals:   09/08/18 0940 09/08/18 0945  BP: 97/68   Pulse: (!) 56 (!) 52  Resp: 16 14  Temp:    SpO2: 97% 97%    Last Pain:  Vitals:   09/08/18 0940  TempSrc:   PainSc: 0-No pain                 Catalina Gravel

## 2018-09-08 NOTE — Anesthesia Preprocedure Evaluation (Signed)
Anesthesia Evaluation  Patient identified by MRN, date of birth, ID band Patient awake    Reviewed: Allergy & Precautions, NPO status , Patient's Chart, lab work & pertinent test results, reviewed documented beta blocker date and time   Airway Mallampati: II  TM Distance: >3 FB Neck ROM: Full    Dental  (+) Dental Advisory Given, Edentulous Upper, Edentulous Lower   Pulmonary neg pulmonary ROS,    Pulmonary exam normal breath sounds clear to auscultation       Cardiovascular hypertension, Pt. on home beta blockers + angina + CAD, + Cardiac Stents and +CHF  Normal cardiovascular exam+ Cardiac Defibrillator  Rhythm:Regular Rate:Normal  Echo 12/20/2017: Study Conclusions  - Left ventricle: The cavity size was mildly dilated. Systolic   function was severely reduced. The estimated ejection fraction was in the range of 25% to 30%. Severe diffuse hypokinesis. Features are consistent with a pseudonormal left ventricular filling pattern, with concomitant abnormal relaxation and increased filling pressure (grade 2 diastolic dysfunction). Doppler parameters are consistent with high ventricular filling   pressure. - Aortic valve: Trileaflet; normal thickness, mildly calcified   leaflets. - Mitral valve: There was mild regurgitation. - Tricuspid valve: There was trivial regurgitation.   Neuro/Psych PSYCHIATRIC DISORDERS Depression CVA, Residual Symptoms    GI/Hepatic negative GI ROS, Neg liver ROS,   Endo/Other  negative endocrine ROS  Renal/GU negative Renal ROS     Musculoskeletal negative musculoskeletal ROS (+)   Abdominal   Peds  Hematology  (+) HIV,   Anesthesia Other Findings Day of surgery medications reviewed with the patient.  Reproductive/Obstetrics                             Anesthesia Physical Anesthesia Plan  ASA: IV  Anesthesia Plan: MAC   Post-op Pain Management:     Induction: Intravenous  PONV Risk Score and Plan: 1 and Propofol infusion and Treatment may vary due to age or medical condition  Airway Management Planned: Nasal Cannula and Natural Airway  Additional Equipment:   Intra-op Plan:   Post-operative Plan:   Informed Consent: I have reviewed the patients History and Physical, chart, labs and discussed the procedure including the risks, benefits and alternatives for the proposed anesthesia with the patient or authorized representative who has indicated his/her understanding and acceptance.     Dental advisory given  Plan Discussed with: CRNA and Anesthesiologist  Anesthesia Plan Comments:         Anesthesia Quick Evaluation

## 2018-09-08 NOTE — Discharge Instructions (Signed)

## 2018-09-08 NOTE — Transfer of Care (Signed)
Immediate Anesthesia Transfer of Care Note  Patient: Richard [name]  Procedure(s) Performed: ESOPHAGOGASTRODUODENOSCOPY (EGD) WITH PROPOFOL (N/A ) SAVORY DILATION (N/A )  Patient Location: PACU  Anesthesia Type:MAC  Level of Consciousness: awake, alert  and oriented  Airway & Oxygen Therapy: Patient Spontanous Breathing and Patient connected to nasal cannula oxygen  Post-op Assessment: Report given to RN and Post -op Vital signs reviewed and stable  Post vital signs: Reviewed and stable  Last Vitals:  Vitals Value Taken Time  BP    Temp    Pulse    Resp    SpO2      Last Pain:  Vitals:   09/08/18 0718  TempSrc: Oral  PainSc: 0-No pain         Complications: No apparent anesthesia complications

## 2018-09-08 NOTE — Op Note (Signed)
[facility] Patient Name: Richard [name] Procedure Date: 09/08/2018 MRN: [id number] Attending MD: Carol Ada , MD Date of Birth: [birth year] CSN: [id number] Age: 63 Admit Type: Ambulatory Procedure:                Upper GI endoscopy Indications:              Dysphagia Providers:                Carol Ada, MD, Elmer Ramp. Tilden Dome, RN, [employer], CRNA Referring MD:              Medicines:                Propofol per Anesthesia Complications:            No immediate complications. Estimated Blood Loss:     Estimated blood loss: none. Estimated blood loss:                            none. Procedure:                Pre-Anesthesia Assessment:                           - Prior to the procedure, a History and Physical                            was performed, and patient medications and                            allergies were reviewed. The patient's tolerance of                            previous anesthesia was also reviewed. The risks                            and benefits of the procedure and the sedation                            options and risks were discussed with the patient.                            All questions were answered, and informed consent                            was obtained. Prior Anticoagulants: The patient has                            taken no previous anticoagulant or antiplatelet                            agents. ASA Grade Assessment: III - A patient with                            severe systemic disease.  After reviewing the risks                            and benefits, the patient was deemed in                            satisfactory condition to undergo the procedure.                           - Sedation was administered by an anesthesia                            professional. Deep sedation was attained.                           After obtaining informed consent, the endoscope was              passed under direct vision. Throughout the                            procedure, the patient's blood pressure, pulse, and                            oxygen saturations were monitored continuously. The                            [id number]-H190 ([id number] Olympus gastroscope was                            introduced through the mouth, and advanced to the                            second part of duodenum. The upper GI endoscopy was                            accomplished without difficulty. The patient                            tolerated the procedure well. Scope In: Scope Out: Findings:      One benign-appearing, intrinsic mild stenosis was found at the       gastroesophageal junction. This stenosis measured 1.6 cm (inner       diameter) x less than one cm (in length). The stenosis was traversed. A       guidewire was placed and the scope was withdrawn. Dilation was performed       with a Savary dilator with no resistance at 18 mm. The dilation site was       examined following endoscope reinsertion and showed no change.      LA Grade A (one or more mucosal breaks less than 5 mm, not extending       between tops of 2 mucosal folds) esophagitis with no bleeding was found       at the gastroesophageal junction.      A 5 cm hiatal hernia was present.      The stomach was normal.      The examined duodenum  was normal.      There is a minimal esophagitis at the Z-line and there is a mild       stenosis in the distal esophagus. Dilation with the 18 mm Savary did not       result in any mucosal break. Impression:               - Benign-appearing esophageal stenosis. Dilated.                           - LA Grade A reflux esophagitis.                           - 5 cm hiatal hernia.                           - Normal stomach.                           - Normal examined duodenum.                           - No specimens collected. Moderate Sedation:      Not Applicable - Patient had care  per Anesthesia. Recommendation:           - Patient has a contact number available for                            emergencies. The signs and symptoms of potential                            delayed complications were discussed with the                            patient. Return to normal activities tomorrow.                            Written discharge instructions were provided to the                            patient.                           - Resume previous diet.                           - Continue present medications.                           - Return to GI clinic in 4 weeks. Procedure Code(s):        --- Professional ---                           [phone], Esophagogastroduodenoscopy, flexible,                            transoral; with insertion of guide wire followed by  passage of dilator(s) through esophagus over guide                            wire Diagnosis Code(s):        --- Professional ---                           K22.2, Esophageal obstruction                           K21.0, Gastro-esophageal reflux disease with                            esophagitis                           K44.9, Diaphragmatic hernia without obstruction or                            gangrene                           R13.10, Dysphagia, unspecified CPT copyright 2018 American Medical Association. All rights reserved. The codes documented in this report are preliminary and upon coder review may  be revised to meet current compliance requirements. Carol Ada, MD Carol Ada, MD 09/08/2018 9:15:06 AM This report has been signed electronically. Number of Addenda: 0

## 2018-09-08 NOTE — H&P (Signed)
  Arden A London Pepper HPI: This is a 63 year old male with a PMH if ischemic cardiomyopathy, CAD, HIV, CVA in 2003, and HTN with an EF of 33%, but a nuc stress test on 03/01/2014 showed an EF of 20-25%. He has three stents in his diagnoal vessel and an ICD was placed on 12/31/1998. The patient complains about dysphagia that started the latter part of last year. The dysphagia is mostly with solid foods, which induces a cough and then vomiting. For the past two years he has been using pantoprazole and he takes it QHS. It helps his GERD in the form of a burning sensation. His HIV is undetectable with his current treatment x 7 years. His CD4 count is around 650.  Past Medical History:  Diagnosis Date  . Coronary artery disease    a. cath 04/02/2014 3v dx 80-90% in D1, 80-90% mid LAD, 95% apical LAD, 95% distal inferior LV branch of LCx, total occlusion of prox RCA  b). Cath 04/03/2014 DES to mid LAD, DES x3 to prox D1, DAPT indefinitely. LCx lesion untreated  . HIV (human immunodeficiency virus infection) ([place])   . Hypertension   . Ischemic cardiomyopathy 2015   EF 20-25 percent by echo in 04/2014, s/p [employer] Fortify Assura model [id number] serial number [id number]  . Stroke [facility])     Past Surgical History:  Procedure Laterality Date  . CARDIAC CATHETERIZATION  04/02/2014   DR Claiborne Billings  . IMPLANTABLE CARDIOVERTER DEFIBRILLATOR IMPLANT N/A 09/10/2014   Procedure: IMPLANTABLE CARDIOVERTER DEFIBRILLATOR IMPLANT;  Surgeon: Sanda Klein, MD;  [facility] model [id number] serial number [id number]  . LEFT AND RIGHT HEART CATHETERIZATION WITH CORONARY ANGIOGRAM N/A 04/02/2014   Procedure: LEFT AND RIGHT HEART CATHETERIZATION WITH CORONARY ANGIOGRAM;  Surgeon: Troy Sine, MD;  Location: [facility] CATH LAB;  Service: Cardiovascular;  Laterality: N/A;  . MEDIAL COLLATERAL LIGAMENT REPAIR, KNEE Right 08/25/2015   Procedure: RIGHT THUMB RADICAL COLLATERAL LIGAMENT REPAIR;  Surgeon: Charlotte Crumb, MD;   Location: [facility];  Service: Orthopedics;  Laterality: Right;  . PERCUTANEOUS CORONARY STENT INTERVENTION (PCI-S) N/A 04/03/2014   Procedure: PERCUTANEOUS CORONARY STENT INTERVENTION (PCI-S);  Surgeon: Peter M Martinique, MD;  Location: [facility] CATH LAB;  Service: Cardiovascular;  Laterality: N/A;    Family History  Problem Relation Age of Onset  . Heart disease Mother     Social History:  reports that he has never smoked. He has never used smokeless tobacco. He reports that he does not drink alcohol or use drugs.  Allergies:  Allergies  Allergen Reactions  . Lisinopril Cough  . Ioxaglate Hives  . Ivp Dye [Iodinated Diagnostic Agents] Hives    Medications:  Scheduled:  Continuous: . sodium chloride      No results found for this or any previous visit (from the past 24 hour(s)).   No results found.  ROS:  As stated above in the HPI otherwise negative.  Blood pressure 113/70, pulse 72, temperature 97.8 F (36.6 C), temperature source Oral, resp. rate 16, height 5\' 10"  (1.778 m), weight 87.1 kg, SpO2 100 %.    PE: Gen: NAD, Alert and Oriented HEENT:  [place]/AT, EOMI Neck: Supple, no LAD Lungs: CTA Bilaterally CV: RRR without M/G/R ABM: Soft, NTND, +BS Ext: No C/C/E  Assessment/Plan: 1) Dysphagia - EGD with dilation.  Alejandrina Raimer D 09/08/2018, 8:15 AM

## 2018-09-11 ENCOUNTER — Encounter (HOSPITAL_COMMUNITY): Payer: Self-pay | Admitting: Gastroenterology

## 2018-10-02 DIAGNOSIS — K222 Esophageal obstruction: Secondary | ICD-10-CM | POA: Diagnosis not present

## 2018-10-02 DIAGNOSIS — K209 Esophagitis, unspecified: Secondary | ICD-10-CM | POA: Diagnosis not present

## 2018-10-02 DIAGNOSIS — K449 Diaphragmatic hernia without obstruction or gangrene: Secondary | ICD-10-CM | POA: Diagnosis not present

## 2018-10-10 ENCOUNTER — Ambulatory Visit (INDEPENDENT_AMBULATORY_CARE_PROVIDER_SITE_OTHER): Payer: Medicare Other | Admitting: *Deleted

## 2018-10-10 DIAGNOSIS — I5042 Chronic combined systolic (congestive) and diastolic (congestive) heart failure: Secondary | ICD-10-CM

## 2018-10-10 DIAGNOSIS — I255 Ischemic cardiomyopathy: Secondary | ICD-10-CM | POA: Diagnosis not present

## 2018-10-10 LAB — CUP PACEART REMOTE DEVICE CHECK
Battery Remaining Longevity: 70 mo
Battery Remaining Percentage: 63 %
Battery Voltage: 3.01 V
Brady Statistic RV Percent Paced: 1 %
Date Time Interrogation Session: 20200303070016
HighPow Impedance: 64 Ohm
HighPow Impedance: 64 Ohm
Implantable Lead Implant Date: 20160202
Implantable Lead Location: 753860
Implantable Pulse Generator Implant Date: 20160202
Lead Channel Pacing Threshold Amplitude: 1 V
Lead Channel Sensing Intrinsic Amplitude: 10.6 mV
Lead Channel Setting Pacing Amplitude: 2.5 V
Lead Channel Setting Pacing Pulse Width: 0.5 ms
Lead Channel Setting Sensing Sensitivity: 0.5 mV
MDC IDC MSMT LEADCHNL RV IMPEDANCE VALUE: 360 Ohm
MDC IDC MSMT LEADCHNL RV PACING THRESHOLD PULSEWIDTH: 0.5 ms
Pulse Gen Serial Number: 7229147

## 2018-10-17 NOTE — Progress Notes (Signed)
Remote ICD transmission.   

## 2018-10-19 DIAGNOSIS — Z125 Encounter for screening for malignant neoplasm of prostate: Secondary | ICD-10-CM | POA: Diagnosis not present

## 2018-10-19 DIAGNOSIS — I10 Essential (primary) hypertension: Secondary | ICD-10-CM | POA: Diagnosis not present

## 2018-10-19 DIAGNOSIS — R55 Syncope and collapse: Secondary | ICD-10-CM | POA: Diagnosis not present

## 2018-10-19 DIAGNOSIS — N183 Chronic kidney disease, stage 3 (moderate): Secondary | ICD-10-CM | POA: Diagnosis not present

## 2018-10-19 DIAGNOSIS — Z23 Encounter for immunization: Secondary | ICD-10-CM | POA: Diagnosis not present

## 2018-10-19 DIAGNOSIS — E785 Hyperlipidemia, unspecified: Secondary | ICD-10-CM | POA: Diagnosis not present

## 2018-10-19 DIAGNOSIS — Z0001 Encounter for general adult medical examination with abnormal findings: Secondary | ICD-10-CM | POA: Diagnosis not present

## 2018-10-19 DIAGNOSIS — R7303 Prediabetes: Secondary | ICD-10-CM | POA: Diagnosis not present

## 2018-10-19 DIAGNOSIS — K219 Gastro-esophageal reflux disease without esophagitis: Secondary | ICD-10-CM | POA: Diagnosis not present

## 2018-10-19 DIAGNOSIS — I5022 Chronic systolic (congestive) heart failure: Secondary | ICD-10-CM | POA: Diagnosis not present

## 2018-10-30 DIAGNOSIS — Z0001 Encounter for general adult medical examination with abnormal findings: Secondary | ICD-10-CM | POA: Diagnosis not present

## 2018-10-30 DIAGNOSIS — R55 Syncope and collapse: Secondary | ICD-10-CM | POA: Diagnosis not present

## 2018-10-30 DIAGNOSIS — E785 Hyperlipidemia, unspecified: Secondary | ICD-10-CM | POA: Diagnosis not present

## 2018-10-30 DIAGNOSIS — Z125 Encounter for screening for malignant neoplasm of prostate: Secondary | ICD-10-CM | POA: Diagnosis not present

## 2018-11-13 DIAGNOSIS — I10 Essential (primary) hypertension: Secondary | ICD-10-CM | POA: Diagnosis not present

## 2018-12-04 DIAGNOSIS — R42 Dizziness and giddiness: Secondary | ICD-10-CM | POA: Diagnosis not present

## 2018-12-04 DIAGNOSIS — R1310 Dysphagia, unspecified: Secondary | ICD-10-CM | POA: Insufficient documentation

## 2018-12-04 DIAGNOSIS — I10 Essential (primary) hypertension: Secondary | ICD-10-CM | POA: Diagnosis not present

## 2018-12-08 ENCOUNTER — Telehealth: Payer: Self-pay | Admitting: Cardiovascular Disease

## 2018-12-08 NOTE — Telephone Encounter (Signed)
Smartphone/ my chart/ consent/ pre reg completed °

## 2018-12-08 NOTE — Telephone Encounter (Signed)
Virtual Visit Pre-Appointment Phone Call  "(Name), I am calling you today to discuss your upcoming appointment. We are currently trying to limit exposure to the virus that causes COVID-19 by seeing patients at home rather than in the office."  1. "What is the BEST phone number to call the day of the visit?" - include this in appointment notes  2. Do you have or have access to (through a family member/friend) a smartphone with video capability that we can use for your visit?" a. If yes - list this number in appt notes as cell (if different from BEST phone #) and list the appointment type as a VIDEO visit in appointment notes b. If no - list the appointment type as a PHONE visit in appointment notes  3. Confirm consent - "In the setting of the current Covid19 crisis, you are scheduled for a (phone or video) visit with your provider on (date) at (time).  Just as we do with many in-office visits, in order for you to participate in this visit, we must obtain consent.  If you'd like, I can send this to your mychart (if signed up) or email for you to review.  Otherwise, I can obtain your verbal consent now.  All virtual visits are billed to your insurance company just like a normal visit would be.  By agreeing to a virtual visit, we'd like you to understand that the technology does not allow for your provider to perform an examination, and thus may limit your provider's ability to fully assess your condition. If your provider identifies any concerns that need to be evaluated in person, we will make arrangements to do so.  Finally, though the technology is pretty good, we cannot assure that it will always work on either your or our end, and in the setting of a video visit, we may have to convert it to a phone-only visit.  In either situation, we cannot ensure that we have a secure connection.  Are you willing to proceed?" STAFF: Did the patient verbally acknowledge consent to telehealth visit? Document  YES/NO here: Yes  4. Advise patient to be prepared - "Two hours prior to your appointment, go ahead and check your blood pressure, pulse, oxygen saturation, and your weight (if you have the equipment to check those) and write them all down. When your visit starts, your provider will ask you for this information. If you have an Apple Watch or Kardia device, please plan to have heart rate information ready on the day of your appointment. Please have a pen and paper handy nearby the day of the visit as well."  5. Give patient instructions for MyChart download to smartphone OR Doximity/[email or web address] as below if video visit (depending on what platform provider is using)  6. Inform patient they will receive a phone call 15 minutes prior to their appointment time (may be from unknown caller ID) so they should be prepared to answer    TELEPHONE CALL NOTE  Richard [name] has been deemed a candidate for a follow-up tele-health visit to limit community exposure during the Covid-19 pandemic. I spoke with the patient via phone to ensure availability of phone/video source, confirm preferred email & phone number, and discuss instructions and expectations.  I reminded Paulette A Steury to be prepared with any vital sign and/or heart rhythm information that could potentially be obtained via home monitoring, at the time of his visit. I reminded Granvel A Ton to expect a phone call prior to  his visit.  Therisa Doyne 12/08/2018 3:45 PM  Pt stated he does have smartphone and is agreeable to do video visit with Dr. Claiborne Billings on 5/4 at 8:20 AM. Pt stated he also has a BP cuff and weight scale at home. Sending instructions and consent for visit to pt email--ok per pt. [email or web address]   IF USING DOXIMITY or DOXY.ME - The patient will receive a link just prior to their visit by text.     FULL LENGTH CONSENT FOR TELE-HEALTH VISIT   I hereby voluntarily request, consent and authorize [facility] and its  employed or contracted physicians, physician assistants, nurse practitioners or other licensed health care professionals (the Practitioner), to provide me with telemedicine health care services (the Services") as deemed necessary by the treating Practitioner. I acknowledge and consent to receive the Services by the Practitioner via telemedicine. I understand that the telemedicine visit will involve communicating with the Practitioner through live audiovisual communication technology and the disclosure of certain medical information by electronic transmission. I acknowledge that I have been given the opportunity to request an in-person assessment or other available alternative prior to the telemedicine visit and am voluntarily participating in the telemedicine visit.  I understand that I have the right to withhold or withdraw my consent to the use of telemedicine in the course of my care at any time, without affecting my right to future care or treatment, and that the Practitioner or I may terminate the telemedicine visit at any time. I understand that I have the right to inspect all information obtained and/or recorded in the course of the telemedicine visit and may receive copies of available information for a reasonable fee.  I understand that some of the potential risks of receiving the Services via telemedicine include:   Delay or interruption in medical evaluation due to technological equipment failure or disruption;  Information transmitted may not be sufficient (e.g. poor resolution of images) to allow for appropriate medical decision making by the Practitioner; and/or   In rare instances, security protocols could fail, causing a breach of personal health information.  Furthermore, I acknowledge that it is my responsibility to provide information about my medical history, conditions and care that is complete and accurate to the best of my ability. I acknowledge that Practitioner's advice,  recommendations, and/or decision may be based on factors not within their control, such as incomplete or inaccurate data provided by me or distortions of diagnostic images or specimens that may result from electronic transmissions. I understand that the practice of medicine is not an exact science and that Practitioner makes no warranties or guarantees regarding treatment outcomes. I acknowledge that I will receive a copy of this consent concurrently upon execution via email to the email address I last provided but may also request a printed copy by calling the office of [facility].    I understand that my insurance will be billed for this visit.   I have read or had this consent read to me.  I understand the contents of this consent, which adequately explains the benefits and risks of the Services being provided via telemedicine.   I have been provided ample opportunity to ask questions regarding this consent and the Services and have had my questions answered to my satisfaction.  I give my informed consent for the services to be provided through the use of telemedicine in my medical care  By participating in this telemedicine visit I agree to the above.

## 2018-12-11 ENCOUNTER — Telehealth (INDEPENDENT_AMBULATORY_CARE_PROVIDER_SITE_OTHER): Payer: Medicare Other | Admitting: Cardiovascular Disease

## 2018-12-11 VITALS — BP 93/68 | Ht 70.5 in | Wt 189.0 lb

## 2018-12-11 DIAGNOSIS — E785 Hyperlipidemia, unspecified: Secondary | ICD-10-CM | POA: Diagnosis not present

## 2018-12-11 DIAGNOSIS — I952 Hypotension due to drugs: Secondary | ICD-10-CM | POA: Diagnosis not present

## 2018-12-11 DIAGNOSIS — Z8673 Personal history of transient ischemic attack (TIA), and cerebral infarction without residual deficits: Secondary | ICD-10-CM | POA: Diagnosis not present

## 2018-12-11 DIAGNOSIS — Z21 Asymptomatic human immunodeficiency virus [HIV] infection status: Secondary | ICD-10-CM | POA: Diagnosis not present

## 2018-12-11 DIAGNOSIS — Z95810 Presence of automatic (implantable) cardiac defibrillator: Secondary | ICD-10-CM | POA: Diagnosis not present

## 2018-12-11 DIAGNOSIS — I255 Ischemic cardiomyopathy: Secondary | ICD-10-CM

## 2018-12-11 DIAGNOSIS — R1310 Dysphagia, unspecified: Secondary | ICD-10-CM | POA: Diagnosis not present

## 2018-12-11 DIAGNOSIS — I2510 Atherosclerotic heart disease of native coronary artery without angina pectoris: Secondary | ICD-10-CM

## 2018-12-11 NOTE — Patient Instructions (Signed)
Medication Instructions:  Stop Isosorbide  If you need a refill on your cardiac medications before your next appointment, please call your pharmacy.   Follow-Up: At [facility], you and your health needs are our priority.  As part of our continuing mission to provide you with exceptional heart care, we have created designated Provider Care Teams.  These Care Teams include your primary Cardiologist (physician) and Advanced Practice Providers (APPs -  Physician Assistants and Nurse Practitioners) who all work together to provide you with the care you need, when you need it. You will need a follow up appointment in 4 months.  Please call our office 2 months in advance to schedule this appointment.  You may see Shelva Majestic, MD or one of the following Advanced Practice Providers on your designated Care Team: [place], [place] . Fabian Sharp, PA-C

## 2018-12-11 NOTE — Progress Notes (Signed)
Virtual Visit via Telephone Note   This visit type was conducted due to national recommendations for restrictions regarding the COVID-19 Pandemic (e.g. social distancing) in an effort to limit this patient's exposure and mitigate transmission in our community.  Due to his co-morbid illnesses, this patient is at least at moderate risk for complications without adequate follow up.  This format is felt to be most appropriate for this patient at this time. Multiple attempts were made at performing this as a video conference.  The patient did not have access to video technology/had technical difficulties with video requiring transitioning to audio format only (telephone).  All issues noted in this document were discussed and addressed.  No physical exam could be performed with this format.  Please refer to the patient's chart for his  consent to telehealth for [facility].   Date:  12/11/2018   ID:  [name], DOB [id number], MRN [id number]  Patient Location: Home Provider Location: Home  PCP:  Audley Hose, MD  Cardiologist:  Shelva Majestic, MD  Electrophysiologist:  None   Evaluation Performed:  Follow-Up Visit  Chief Complaint:  1 year follow-up visit with chief complaint of lightheadedness and low blood pressure  History of Present Illness:    Arvin A Willhite is a 63 y.o. male who suffered a CVA in 2003 and has been followed at the [facility] hospital.   In 2003 he underwent cardiac catheterization which  which did not reveal significant coronary artery disease. Prior to catheterization  ejection fraction was 33% with multiple areas of scar noted on a nuclear perfusion study.   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  He has a history of hypertension, as well as hyperlipidemia.  There is also a history of depression, for which he takes Risperdal.  I initially saw him  with complaints of some episodes of left-sided chest pain with walking, associated with left arm numbness and increasing shortness of breath with activity and is referred for nuclear study. He underwent a recent nuclear perfusion study on 03/01/2014.  Ejection fraction was about 27%.  This showed extensive scar multiple coronary territories without definitive reversible ischemia.  An echo Doppler study  on 03/01/2014 showed an ejection fraction of 20-25% with severe global hypokinesis, but more pronounced inferior hypokinesis.  He underwent cardiac catheterization by me on 04/02/2014 and was found to have severe  cardiomyopathy with ejection fraction of approximately 15%.  He had multivessel CAD with 80 and 90% stenoses in the first diagonal branch of the LAD, 80-90% LAD stenoses after the first septal perforating artery, and 95% apical LAD stenosis.  There is a 95% distal inferior LV branch stenosis of the distal circumflex and the RCA was totally occluded proximally with left to right collaterals.  He had normal right heart pressures and there was no evidence for pulmonary hypertension.  He underwent surgical evaluation and was turned down.  The following day he underwent successful intervention to his diagonal and LAD by Dr. Martinique with insertion of a 3.0x38 mm Promus DES stent in the mid LAD and 3 stents placed in the diagonal vessel.  I referred him to Dr. Sallyanne Kuster for implantation of a cardio defibrillator and a single-chamber ICD was inserted on 09/10/2014.   He saw Dr. Recardo Evangelist on 12/31/2014 for follow-up evaluation. Interrogation revealed NSVT but without delivered therapy to date. He is participating in [employer]  remote follow-up.  A follow-up echo Doppler study on March 22, 2016 showed an EF which had improved to 30 to 35%.  There was grade 2 diastolic dysfunction, mild MR, and RV systolic function was moderately reduced.  He is evaluated at the [facility] every 3-4 months for his HIV.  In  early 2018 , he had an ICD discharge after he had run out of his metoprolol for 1 week and was shocked while sleeping.    I last saw him in 12/2017 and he remained stable from a cardiac standpoint.  He has undetected HIV virus for greater than 7 years and continues to be on treatment.  He walks on a treadmill and rides an exercise bike at least 3 to 4 days/week.  He denied chest pain or shortness of breath.  He saw Dr. Sallyanne Kuster in February 2019 for his ICD check. Estimated longevity was 6.5 years.  He had normal lead parameters and normal device function. He has consistently had low blood pressures.  [facility]  [facility] and he was started on losartan.  He saw Doreene Adas for  clinic follow-up.  He was doing much better on the losartan and was without cough. Blood pressure has increased slightly to 120/66 from his baseline of systolic in the 74Y to low 100s.  He is feeling much better with this change.  He has not had to take PRN Lasix since January of this year.  He was euvolemic on exam.   The patient complained about dysphagia that started the latter part of last year. He saw Dr. Benson Norway. The dysphagia is mostly with solid foods, which induces a cough and then vomiting. For the past two years he has been using pantoprazole and he takes it QHS. It helps his GERD in the form of a burning sensation. His HIV is undetectable with his current treatment x 7 years. His CD4 count is around 650. He underwent ECG with dilation on 09/08/2018.  Since I last saw him, he tells me sometime last year in 2019 he had run out of his metoprolol for several days and apparently had a defibrillator discharge.  Over the past year, he has continued to have issues with low blood pressure which typically runs in the 90s.  On some occasions his blood pressure has been as low as 88 but typically runs in the low to mid 90s.  When his blood pressure gets very low he does admit to some lightheadedness  particularly with walking.  He denies any associated arrhythmia.  He has not experienced any anginal type symptoms.  He presents for evaluation.  The patient does not have symptoms concerning for COVID-19 infection (fever, chills, cough, or new shortness of breath).    Past Medical History:  Diagnosis Date   Coronary artery disease    a. cath 04/02/2014 3v dx 80-90% in D1, 80-90% mid LAD, 95% apical LAD, 95% distal inferior LV branch of LCx, total occlusion of prox RCA  b). Cath 04/03/2014 DES to mid LAD, DES x3 to prox D1, DAPT indefinitely. LCx lesion untreated   HIV (human immunodeficiency virus infection) ([place])    Hypertension    Ischemic cardiomyopathy 2015   EF 20-25 percent by echo in 04/2014, s/p St. Jude Fortify Assura model CD1357-40Q serial number [id number]   Stroke [facility])    Past Surgical History:  Procedure Laterality Date   CARDIAC CATHETERIZATION  04/02/2014   DR Claiborne Billings   ESOPHAGOGASTRODUODENOSCOPY (EGD) WITH  PROPOFOL N/A 09/08/2018   Procedure: ESOPHAGOGASTRODUODENOSCOPY (EGD) WITH PROPOFOL;  Surgeon: Carol Ada, MD;  Location: [facility];  Service: Endoscopy;  Laterality: N/A;   IMPLANTABLE CARDIOVERTER DEFIBRILLATOR IMPLANT N/A 09/10/2014   Procedure: IMPLANTABLE CARDIOVERTER DEFIBRILLATOR IMPLANT;  Surgeon: Sanda Klein, MD;  [facility] [id number] serial number [id number]   LEFT AND RIGHT HEART CATHETERIZATION WITH CORONARY ANGIOGRAM N/A 04/02/2014   Procedure: LEFT AND RIGHT HEART CATHETERIZATION WITH CORONARY ANGIOGRAM;  Surgeon: Troy Sine, MD;  Location: [facility] CATH LAB;  Service: Cardiovascular;  Laterality: N/A;   MEDIAL COLLATERAL LIGAMENT REPAIR, KNEE Right 08/25/2015   Procedure: RIGHT THUMB RADICAL COLLATERAL LIGAMENT REPAIR;  Surgeon: Charlotte Crumb, MD;  Location: [place];  Service: Orthopedics;  Laterality: Right;   PERCUTANEOUS CORONARY STENT INTERVENTION (PCI-S) N/A 04/03/2014   Procedure: PERCUTANEOUS CORONARY STENT INTERVENTION  (PCI-S);  Surgeon: Peter M Martinique, MD;  Location: [facility] CATH LAB;  Service: Cardiovascular;  Laterality: N/A;   SAVORY DILATION N/A 09/08/2018   Procedure: SAVORY DILATION;  Surgeon: Carol Ada, MD;  Location: [facility];  Service: Endoscopy;  Laterality: N/A;     Current Meds  Medication Sig   abacavir-dolutegravir-lamiVUDine (TRIUMEQ) 600-50-300 MG tablet Take 1 tablet by mouth daily.   aspirin 81 MG chewable tablet Chew 81 mg by mouth daily.   cetirizine (ZYRTEC) 10 MG tablet Take 10 mg by mouth daily.   DULoxetine (CYMBALTA) 30 MG capsule Take 30 mg by mouth 2 (two) times daily.   fluticasone (FLONASE) 50 MCG/ACT nasal spray Place 2 sprays into both nostrils daily as needed for rhinitis.   isosorbide mononitrate (IMDUR) 30 MG 24 hr tablet Take 0.5 tablets (15 mg total) by mouth daily.   Lactobacillus (ACIDOPHILUS/PECTIN PO) Take 1 capsule by mouth daily.   losartan (COZAAR) 50 MG tablet Take 50 mg by mouth daily.    metoprolol succinate (TOPROL-XL) 100 MG 24 hr tablet Take 50 mg by mouth daily. Take with or immediately following a meal.   Multiple Vitamins-Minerals (CENTRUM SILVER PO) Take 2 tablets by mouth daily.   pantoprazole (PROTONIX) 40 MG tablet Take 1 tablet (40 mg total) by mouth daily as needed. (Patient taking differently: Take 40 mg by mouth daily. )   Polyvinyl Alcohol-Povidone PF (REFRESH) 1.4-0.6 % SOLN Place 1 drop into both eyes 6 (six) times daily.   risperiDONE (RISPERDAL) 1 MG tablet Take 1.5 mg by mouth at bedtime.   rosuvastatin (CRESTOR) 20 MG tablet Take 20 mg by mouth daily.     Allergies:   Lisinopril; Ioxaglate; and Ivp dye [iodinated diagnostic agents]   Social History   Tobacco Use   Smoking status: Never Smoker   Smokeless tobacco: Never Used  Substance Use Topics   Alcohol use: No   Drug use: No    Socially, he is married for 30 years.  Has one child.  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  He completed  12th grade education.  There is no tobacco use.  He does drink alcohol.  Family Hx: The patient's family history includes Heart disease in his mother.  ROS:   Please see the history of present illness.    Positive for low blood pressure Positive for occasional episodes of dizziness No wheezing 1 defibrillator discharge in 2019 No chest pain. Recent history of difficulty swallowing, improved with esophageal dilatation Positive for controlled HIV on therapy No leg swelling Positive for depression Sleeping adequately All other systems reviewed and are negative.   Prior  CV studies:   The following studies were reviewed today:  ------------------------------------------------------------------- ECHO Study Conclusions: 12/20/2017  - Left ventricle: The cavity size was mildly dilated. Systolic   function was severely reduced. The estimated ejection fraction   was in the range of 25% to 30%. Severe diffuse hypokinesis.   Features are consistent with a pseudonormal left ventricular   filling pattern, with concomitant abnormal relaxation and   increased filling pressure (grade 2 diastolic dysfunction).   Doppler parameters are consistent with high ventricular filling   pressure. - Aortic valve: Trileaflet; normal thickness, mildly calcified   leaflets. - Mitral valve: There was mild regurgitation. - Tricuspid valve: There was trivial regurgitation.   Labs/Other Tests and Data Reviewed:    EKG:  An ECG dated 12/09/2017 was personally reviewed today and demonstrated:  Normal sinus rhythm at 62 bpm.  Isolated PVC.  Inferior Q waves.  No ectopy.  QTc interval 401 ms  Recent Labs: No results found for requested labs within last 8760 hours.   He gets his laboratory at the [facility].  Next scheduled time is June 2020.  Recent Lipid Panel Lab Results  Component Value Date/Time   CHOL 125 09/05/2015 08:48 AM   TRIG 86 09/05/2015 08:48 AM   HDL 32 (L) 09/05/2015 08:48 AM   CHOLHDL  3.9 09/05/2015 08:48 AM   LDLCALC 76 09/05/2015 08:48 AM    Wt Readings from Last 3 Encounters:  12/11/18 189 lb (85.7 kg)  09/08/18 192 lb (87.1 kg)  04/26/18 185 lb 9.6 oz (84.2 kg)     Objective:    Vital Signs:  BP 93/68    Ht 5' 10.5" (1.791 m)    Wt 189 lb (85.7 kg)    BMI 26.74 kg/m    Multiple attempts were made to have this be a video conference.  Apparently there was a defect in his camera and as result it was transition to a phone visit.  He denies any change in physical appearance His breathing was normal and not labored There was no audible wheezing He denied any chest wall tenderness to palpation He denied any abdominal discomfort to palpation He did not have any leg swelling He denied any neurologic symptoms His affect was appropriate and he had normal cognition  ASSESSMENT & PLAN:    1. Ischemic cardiomyopathy: Ejection fraction in August 2015 was 15%.  This subsequently increased to 30  to 35% following revascularization.  Most recent echo 25 to 30%.  He continues to be on low-dose losartan, isosorbide mononitrate, and Toprol-XL.  His blood pressure has been low negating opportunity for Entresto.  He is breathing well.  He walks 2-3 times per week without chest pain.  He has issues with low blood pressure and occasional episodes of dizziness.  Since he is not had any anginal symptomatology, and he is only taking Imdur 15 mg I have suggested discontinuance of isosorbide mononitrate and also suggested support stockings. 2. CAD: In 2015 he was found to have severe multivessel CAD and underwent successful intervention to his diagonal and LAD.  He has not had recurrent anginal symptoms.  LV function improved.  With his very low consistent blood pressure I will attempt discontinuing isosorbide but he will continue low-dose losartan and metoprolol succinate. 3. Status post implantation of cardio defibrillator February 2016.  He tells me that he had run out of his metoprolol  for several days last year and had a defibrillator discharge and was seen at the [place]. 4. Hyperlipidemia:  Currently on rosuvastatin 20 mg.  Laboratory is to be drawn at the [place] in June. 5. Occasional episodes of dizziness secondary to low blood pressure.  Medication adjustment as noted above 6. Swallowing difficulty: Status post recent esophageal dilatation by Dr. Benson Norway in January 2020 with improvement. 7. HIV: Currently stable on medical therapy with negative virus load for over 7 years 8. Depression: On risperidone.  COVID-19 Education: The signs and symptoms of COVID-19 were discussed with the patient and how to seek care for testing (follow up with PCP or arrange E-visit).  The importance of social distancing was discussed today.  Time:   Today, I have spent 30 minutes with the patient with telehealth technology discussing the above problems.     Medication Adjustments/Labs and Tests Ordered: Current medicines are reviewed at length with the patient today.  Concerns regarding medicines are outlined above.   Tests Ordered: No orders of the defined types were placed in this encounter.   Medication Changes: No orders of the defined types were placed in this encounter.   Disposition:  Follow up in 4 month(s)  Signed, Shelva Majestic, MD  12/11/2018 8:14 AM    [place]

## 2018-12-12 ENCOUNTER — Telehealth: Payer: Self-pay | Admitting: Cardiovascular Disease

## 2018-12-12 NOTE — Telephone Encounter (Signed)
Smartphone/consent/ my chart/ pre reg completed °

## 2018-12-13 ENCOUNTER — Telehealth: Payer: Self-pay

## 2018-12-13 ENCOUNTER — Telehealth (INDEPENDENT_AMBULATORY_CARE_PROVIDER_SITE_OTHER): Payer: Medicare Other | Admitting: Cardiovascular Disease

## 2018-12-13 VITALS — BP 109/73 | HR 73 | Ht 70.5 in | Wt 190.0 lb

## 2018-12-13 DIAGNOSIS — Z95810 Presence of automatic (implantable) cardiac defibrillator: Secondary | ICD-10-CM

## 2018-12-13 DIAGNOSIS — E785 Hyperlipidemia, unspecified: Secondary | ICD-10-CM | POA: Diagnosis not present

## 2018-12-13 DIAGNOSIS — I2510 Atherosclerotic heart disease of native coronary artery without angina pectoris: Secondary | ICD-10-CM

## 2018-12-13 DIAGNOSIS — I10 Essential (primary) hypertension: Secondary | ICD-10-CM

## 2018-12-13 DIAGNOSIS — I472 Ventricular tachycardia: Secondary | ICD-10-CM

## 2018-12-13 DIAGNOSIS — I4729 Other ventricular tachycardia: Secondary | ICD-10-CM

## 2018-12-13 DIAGNOSIS — I255 Ischemic cardiomyopathy: Secondary | ICD-10-CM | POA: Diagnosis not present

## 2018-12-13 DIAGNOSIS — B20 Human immunodeficiency virus [HIV] disease: Secondary | ICD-10-CM

## 2018-12-13 DIAGNOSIS — I5042 Chronic combined systolic (congestive) and diastolic (congestive) heart failure: Secondary | ICD-10-CM | POA: Diagnosis not present

## 2018-12-13 NOTE — Patient Instructions (Signed)
Medication Instructions:  Your physician recommends that you continue on your current medications as directed. Please refer to the Current Medication list given to you today.  If you need a refill on your cardiac medications before your next appointment, please call your pharmacy.   Lab work: NONE If you have labs (blood work) drawn today and your tests are completely normal, you will receive your results only by: [name] MyChart Message (if you have MyChart) OR . A paper copy in the mail If you have any lab test that is abnormal or we need to change your treatment, we will call you to review the results.  Testing/Procedures: NONE  Follow-Up: At [facility], you and your health needs are our priority.  As part of our continuing mission to provide you with exceptional heart care, we have created designated Provider Care Teams.  These Care Teams include your primary Cardiologist (physician) and Advanced Practice Providers (APPs -  Physician Assistants and Nurse Practitioners) who all work together to provide you with the care you need, when you need it. You will need a follow up appointment in 12 months.  Please call our office 2 months in advance to schedule this appointment.  You may see Dr. Sallyanne Kuster or one of the following Advanced Practice Providers on your designated Care Team: Almyra Deforest, [place] . Fabian Sharp, PA-C

## 2018-12-13 NOTE — Telephone Encounter (Signed)
Patient and/or DPR-approved person aware of AVS instructions and verbalized understanding. Letter including After Visit Summary and any other necessary documents to be mailed to the patient's address on file.  

## 2018-12-13 NOTE — Progress Notes (Signed)
Virtual Visit via Video Note   This visit type was conducted due to national recommendations for restrictions regarding the COVID-19 Pandemic (e.g. social distancing) in an effort to limit this patient's exposure and mitigate transmission in our community.  Due to his co-morbid illnesses, this patient is at least at moderate risk for complications without adequate follow up.  This format is felt to be most appropriate for this patient at this time.  All issues noted in this document were discussed and addressed.  A limited physical exam was performed with this format.  Please refer to the patient's chart for his consent to telehealth for [facility].   Date:  12/13/2018   ID:  [name], DOB Oct 18, 1955, MRN [id number]  Patient Location: Home Provider Location: Home  PCP:  Audley Hose, MD  Cardiologist:  Shelva Majestic, MD  Electrophysiologist:  None   Evaluation Performed:  Follow-Up Visit  Chief Complaint:  ICD follow-up  History of Present Illness:    Richard [name] is a 63 y.o. 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 Jude defibrillator for primary prevention.  He received an appropriate shock for ventricular fibrillation on September 30, 2016, after he been out of his metoprolol for about a week.   The patient specifically denies any chest pain at rest exertion, dyspnea at rest or with exertion, orthopnea, paroxysmal nocturnal dyspnea, syncope, palpitations, focal neurological deficits, intermittent claudication, lower extremity edema, unexplained weight gain, cough, hemoptysis or wheezing.  He does complain about a constant postnasal drip, worse in the morning.  He goes for routine walks in the park and does some housework without any cardiac complaints.  He has  not required any extra diuretic doses since January.  He had a telemedicine visit with Dr. Claiborne Billings yesterday and describes problems with low blood pressure in the 90s.  His isosorbide was discontinued.  He has not had chest pain in a long time.  His most recent nuclear stress test in 2015 showed extensive scar without ischemia and the left ventricular ejection fraction of 27%.His most recent echocardiogram in May 2019 showed an EF of 25-30% with grade 2 diastolic dysfunction.  His most recent ICD remote check showed normal device function without any recent tachycardia events.  He only had 1% right ventricular pacing.  Lead and generator parameters were normal.  Corvue had shown some oscillation but was currently at baseline.  He has problems with GERD and had an EGD from Dr. Almyra Free.  He has well suppressed HIV infection, followed at the [facility].  The patient does not have symptoms concerning for COVID-19 infection (fever, chills, cough, or new shortness of breath).    Past Medical History:  Diagnosis Date  . Coronary artery disease    a. cath 04/02/2014 3v dx 80-90% in D1, 80-90% mid LAD, 95% apical LAD, 95% distal inferior LV branch of LCx, total occlusion of prox RCA  b). Cath 04/03/2014 DES to mid LAD, DES x3 to prox D1, DAPT indefinitely. LCx lesion untreated  . HIV (human immunodeficiency virus infection) ([place])   . Hypertension   . Ischemic cardiomyopathy 2015   EF 20-25 percent by echo in 04/2014, s/p St. Jude Fortify Assura model [id number] serial number [id number]  . Stroke [facility])    Past Surgical History:  Procedure Laterality Date  . CARDIAC CATHETERIZATION  04/02/2014  DR Claiborne Billings  . ESOPHAGOGASTRODUODENOSCOPY (EGD) WITH PROPOFOL N/A 09/08/2018   Procedure: ESOPHAGOGASTRODUODENOSCOPY (EGD) WITH PROPOFOL;  Surgeon: Carol Ada, MD;  Location: [facility];  Service: Endoscopy;  Laterality: N/A;  . IMPLANTABLE CARDIOVERTER DEFIBRILLATOR IMPLANT N/A 09/10/2014   Procedure: IMPLANTABLE  CARDIOVERTER DEFIBRILLATOR IMPLANT;  Surgeon: Sanda Klein, MD;  [facility] [place] model [id number] serial number [id number]  . LEFT AND RIGHT HEART CATHETERIZATION WITH CORONARY ANGIOGRAM N/A 04/02/2014   Procedure: LEFT AND RIGHT HEART CATHETERIZATION WITH CORONARY ANGIOGRAM;  Surgeon: Troy Sine, MD;  Location: [facility] CATH LAB;  Service: Cardiovascular;  Laterality: N/A;  . MEDIAL COLLATERAL LIGAMENT REPAIR, KNEE Right 08/25/2015   Procedure: RIGHT THUMB RADICAL COLLATERAL LIGAMENT REPAIR;  Surgeon: Charlotte Crumb, MD;  Location: [place];  Service: Orthopedics;  Laterality: Right;  . PERCUTANEOUS CORONARY STENT INTERVENTION (PCI-S) N/A 04/03/2014   Procedure: PERCUTANEOUS CORONARY STENT INTERVENTION (PCI-S);  Surgeon: Peter M Martinique, MD;  Location: [facility] CATH LAB;  Service: Cardiovascular;  Laterality: N/A;  . SAVORY DILATION N/A 09/08/2018   Procedure: SAVORY DILATION;  Surgeon: Carol Ada, MD;  [facility];  Service: Endoscopy;  Laterality: N/A;     Current Meds  Medication Sig  . abacavir-dolutegravir-lamiVUDine (TRIUMEQ) 600-50-300 MG tablet Take 1 tablet by mouth daily.  [name] aspirin 81 MG chewable tablet Chew 81 mg by mouth daily.  . cetirizine (ZYRTEC) 10 MG tablet Take 10 mg by mouth daily.  . DULoxetine (CYMBALTA) 30 MG capsule Take 30 mg by mouth 2 (two) times daily.  . fluticasone (FLONASE) 50 MCG/ACT nasal spray Place 2 sprays into both nostrils daily as needed for rhinitis.  . Lactobacillus (ACIDOPHILUS/PECTIN PO) Take 1 capsule by mouth daily.  [name] losartan (COZAAR) 50 MG tablet Take 50 mg by mouth daily.   . metoprolol succinate (TOPROL-XL) 100 MG 24 hr tablet Take 50 mg by mouth daily. Take with or immediately following a meal.  . Multiple Vitamins-Minerals (CENTRUM SILVER PO) Take 2 tablets by mouth daily.  . pantoprazole (PROTONIX) 40 MG tablet Take 1 tablet (40 mg total) by mouth daily as needed. (Patient taking differently: Take 40 mg by mouth daily. )  .  Polyvinyl Alcohol-Povidone PF (REFRESH) 1.4-0.6 % SOLN Place 1 drop into both eyes 6 (six) times daily.  . risperiDONE (RISPERDAL) 1 MG tablet Take 1.5 mg by mouth at bedtime.  . rosuvastatin (CRESTOR) 20 MG tablet Take 20 mg by mouth daily.     Allergies:   Lisinopril; Ioxaglate; and Ivp dye [iodinated diagnostic agents]   Social History   Tobacco Use  . Smoking status: Never Smoker  . Smokeless tobacco: Never Used  Substance Use Topics  . Alcohol use: No  . Drug use: No     Family Hx: The patient's family history includes Heart disease in his mother.  ROS:   Please see the history of present illness.     All other systems reviewed and are negative.   Prior CV studies:   The following studies were reviewed today: Notes from Dr. Ellouise Newer, comprehensive ICD check  Labs/Other Tests and Data Reviewed:    EKG:  An ECG dated 12/09/2017 was personally reviewed today and demonstrated:  Normal sinus rhythm, rare PVCs, old inferior infarction.  Normal QTC 401 ms  Recent Labs: No results found for requested labs within last 8760 hours.   Recent Lipid Panel Lab Results  Component Value Date/Time   CHOL 125 09/05/2015 08:48 AM   TRIG 86 09/05/2015 08:48 AM   HDL 32 (  L) 09/05/2015 08:48 AM   CHOLHDL 3.9 09/05/2015 08:48 AM   LDLCALC 76 09/05/2015 08:48 AM    Wt Readings from Last 3 Encounters:  12/13/18 190 lb (86.2 kg)  12/11/18 189 lb (85.7 kg)  09/08/18 192 lb (87.1 kg)     Objective:    Vital Signs:  BP 109/73   Pulse 73   Ht 5' 10.5" (1.791 m)   Wt 190 lb (86.2 kg)   BMI 26.88 kg/m    VITAL SIGNS:  reviewed GEN:  no acute distress EYES:  sclerae anicteric, EOMI - Extraocular Movements Intact RESPIRATORY:  normal respiratory effort, symmetric expansion CARDIOVASCULAR:  no peripheral edema SKIN:  no rash, lesions or ulcers. MUSCULOSKELETAL:  no obvious deformities. NEURO:  alert and oriented x 3, no obvious focal deficit PSYCH:  normal affect  ASSESSMENT &  PLAN:    1. CHF: Well compensated and appears euvolemic without the need for daily diuretic.  Has not required any furosemide since January.  NYHA functional class I at this point.  On ARB and metoprolol.  His EF is very low and Entresto can be considered, but he has excellent functional status at this point and his blood pressure is rather low. 2. CAD: Recently stopped isosorbide due to low blood pressure.  No angina so far.  Most recent nuclear stress test did not show any areas of ischemia, but did show extensive scarring. 3. VT: Had an appropriate defibrillator shock in the setting of beta-blocker withdrawal in early 2018.  No recent episodes of sustained or nonsustained VT.  Reviewed the importance of careful in the risks of beta-blocker therapy to avoid a recurrence of these events. 4. ICD: Normal device check.  Next remote download is due in June. 5. HLP: On statin.  Target LDL less than 70. 6. HIV: Reports compliance with medications and undetectable virus. 7. Postnasal drip: Okay to take over-the-counter nonsedating asked that histamines that do not contain a decongestant.  COVID-19 Education: The signs and symptoms of COVID-19 were discussed with the patient and how to seek care for testing (follow up with PCP or arrange E-visit).  The importance of social distancing was discussed today.  Time:   Today, I have spent 21 minutes with the patient with telehealth technology discussing the above problems.     Medication Adjustments/Labs and Tests Ordered: Current medicines are reviewed at length with the patient today.  Concerns regarding medicines are outlined above.   Tests Ordered: No orders of the defined types were placed in this encounter.   Medication Changes: No orders of the defined types were placed in this encounter.   Disposition:  Follow up Remote downloads every 3 months and office visit in a year.  Signed, Sanda Klein, MD  12/13/2018 8:27 AM      [facility]

## 2018-12-19 ENCOUNTER — Telehealth: Payer: Self-pay | Admitting: Cardiovascular Disease

## 2018-12-19 NOTE — Telephone Encounter (Signed)
Called patient, advised it was okay to go to the store- discussed cross contamination with gloves, patient was made aware to wear a mask. He had no other questions at this time.

## 2018-12-19 NOTE — Telephone Encounter (Signed)
New Message   Patient is calling to see if he is able to go out to the grocery store if he wears some gloves and a mask. Please call.

## 2019-01-09 ENCOUNTER — Ambulatory Visit (INDEPENDENT_AMBULATORY_CARE_PROVIDER_SITE_OTHER): Payer: Medicare Other | Admitting: *Deleted

## 2019-01-09 DIAGNOSIS — I255 Ischemic cardiomyopathy: Secondary | ICD-10-CM | POA: Diagnosis not present

## 2019-01-09 LAB — CUP PACEART REMOTE DEVICE CHECK
Battery Remaining Longevity: 69 mo
Battery Remaining Percentage: 61 %
Battery Voltage: 2.99 V
Brady Statistic RV Percent Paced: 1 %
Date Time Interrogation Session: 20200602060016
HighPow Impedance: 62 Ohm
HighPow Impedance: 62 Ohm
Implantable Lead Implant Date: 20160202
Implantable Lead Location: 753860
Implantable Pulse Generator Implant Date: 20160202
Lead Channel Impedance Value: 360 Ohm
Lead Channel Pacing Threshold Amplitude: 1 V
Lead Channel Pacing Threshold Pulse Width: 0.5 ms
Lead Channel Sensing Intrinsic Amplitude: 11.4 mV
Lead Channel Setting Pacing Amplitude: 2.5 V
Lead Channel Setting Pacing Pulse Width: 0.5 ms
Lead Channel Setting Sensing Sensitivity: 0.5 mV
Pulse Gen Serial Number: 7229147

## 2019-01-17 ENCOUNTER — Encounter: Payer: Self-pay | Admitting: Cardiology

## 2019-01-17 NOTE — Progress Notes (Signed)
Remote ICD transmission.   

## 2019-02-02 DIAGNOSIS — R143 Flatulence: Secondary | ICD-10-CM | POA: Diagnosis not present

## 2019-02-02 DIAGNOSIS — I255 Ischemic cardiomyopathy: Secondary | ICD-10-CM | POA: Diagnosis not present

## 2019-02-02 DIAGNOSIS — K219 Gastro-esophageal reflux disease without esophagitis: Secondary | ICD-10-CM | POA: Diagnosis not present

## 2019-02-02 DIAGNOSIS — E785 Hyperlipidemia, unspecified: Secondary | ICD-10-CM | POA: Diagnosis not present

## 2019-02-02 DIAGNOSIS — I10 Essential (primary) hypertension: Secondary | ICD-10-CM | POA: Diagnosis not present

## 2019-03-02 DIAGNOSIS — M79662 Pain in left lower leg: Secondary | ICD-10-CM | POA: Diagnosis not present

## 2019-03-02 DIAGNOSIS — L819 Disorder of pigmentation, unspecified: Secondary | ICD-10-CM | POA: Diagnosis not present

## 2019-03-02 DIAGNOSIS — L649 Androgenic alopecia, unspecified: Secondary | ICD-10-CM | POA: Diagnosis not present

## 2019-03-06 ENCOUNTER — Other Ambulatory Visit: Payer: Self-pay | Admitting: Internal Medicine

## 2019-03-06 DIAGNOSIS — M79662 Pain in left lower leg: Secondary | ICD-10-CM

## 2019-03-14 ENCOUNTER — Ambulatory Visit
Admission: RE | Admit: 2019-03-14 | Discharge: 2019-03-14 | Disposition: A | Discharge status: Home / Self Care | Payer: Medicare Other | Source: Ambulatory Visit | Attending: Internal Medicine | Admitting: Internal Medicine

## 2019-03-14 DIAGNOSIS — M79605 Pain in left leg: Secondary | ICD-10-CM | POA: Diagnosis not present

## 2019-03-14 DIAGNOSIS — M79662 Pain in left lower leg: Secondary | ICD-10-CM | POA: Diagnosis not present

## 2019-04-11 ENCOUNTER — Ambulatory Visit (INDEPENDENT_AMBULATORY_CARE_PROVIDER_SITE_OTHER): Payer: Medicare Other | Admitting: *Deleted

## 2019-04-11 DIAGNOSIS — I5042 Chronic combined systolic (congestive) and diastolic (congestive) heart failure: Secondary | ICD-10-CM | POA: Diagnosis not present

## 2019-04-11 DIAGNOSIS — I4729 Other ventricular tachycardia: Secondary | ICD-10-CM

## 2019-04-11 DIAGNOSIS — I472 Ventricular tachycardia: Secondary | ICD-10-CM

## 2019-04-12 LAB — CUP PACEART REMOTE DEVICE CHECK
Battery Remaining Longevity: 66 mo
Battery Remaining Percentage: 59 %
Battery Voltage: 2.99 V
Brady Statistic RV Percent Paced: 1 %
Date Time Interrogation Session: 20200901060025
HighPow Impedance: 64 Ohm
HighPow Impedance: 64 Ohm
Implantable Lead Implant Date: 20160202
Implantable Lead Location: 753860
Implantable Pulse Generator Implant Date: 20160202
Lead Channel Impedance Value: 360 Ohm
Lead Channel Pacing Threshold Amplitude: 1 V
Lead Channel Pacing Threshold Pulse Width: 0.5 ms
Lead Channel Sensing Intrinsic Amplitude: 9.9 mV
Lead Channel Setting Pacing Amplitude: 2.5 V
Lead Channel Setting Pacing Pulse Width: 0.5 ms
Lead Channel Setting Sensing Sensitivity: 0.5 mV
Pulse Gen Serial Number: 7229147

## 2019-04-25 DIAGNOSIS — E785 Hyperlipidemia, unspecified: Secondary | ICD-10-CM | POA: Diagnosis not present

## 2019-04-25 DIAGNOSIS — I10 Essential (primary) hypertension: Secondary | ICD-10-CM | POA: Diagnosis not present

## 2019-04-25 DIAGNOSIS — H6123 Impacted cerumen, bilateral: Secondary | ICD-10-CM | POA: Diagnosis not present

## 2019-04-25 DIAGNOSIS — I255 Ischemic cardiomyopathy: Secondary | ICD-10-CM | POA: Diagnosis not present

## 2019-04-25 DIAGNOSIS — K219 Gastro-esophageal reflux disease without esophagitis: Secondary | ICD-10-CM | POA: Diagnosis not present

## 2019-04-26 NOTE — Progress Notes (Signed)
Remote ICD transmission.   

## 2019-05-03 DIAGNOSIS — Z23 Encounter for immunization: Secondary | ICD-10-CM | POA: Diagnosis not present

## 2019-07-11 ENCOUNTER — Ambulatory Visit (INDEPENDENT_AMBULATORY_CARE_PROVIDER_SITE_OTHER): Payer: Medicare Other | Admitting: *Deleted

## 2019-07-11 DIAGNOSIS — I255 Ischemic cardiomyopathy: Secondary | ICD-10-CM | POA: Diagnosis not present

## 2019-07-11 LAB — CUP PACEART REMOTE DEVICE CHECK
Battery Remaining Longevity: 62 mo
Battery Remaining Percentage: 57 %
Battery Voltage: 2.99 V
Brady Statistic RV Percent Paced: 1 %
Date Time Interrogation Session: 20201202020023
HighPow Impedance: 64 Ohm
HighPow Impedance: 64 Ohm
Implantable Lead Implant Date: 20160202
Implantable Lead Location: 753860
Implantable Pulse Generator Implant Date: 20160202
Lead Channel Impedance Value: 360 Ohm
Lead Channel Pacing Threshold Amplitude: 1 V
Lead Channel Pacing Threshold Pulse Width: 0.5 ms
Lead Channel Sensing Intrinsic Amplitude: 10 mV
Lead Channel Setting Pacing Amplitude: 2.5 V
Lead Channel Setting Pacing Pulse Width: 0.5 ms
Lead Channel Setting Sensing Sensitivity: 0.5 mV
Pulse Gen Serial Number: 7229147

## 2019-08-07 DIAGNOSIS — D696 Thrombocytopenia, unspecified: Secondary | ICD-10-CM | POA: Diagnosis not present

## 2019-08-07 DIAGNOSIS — E785 Hyperlipidemia, unspecified: Secondary | ICD-10-CM | POA: Diagnosis not present

## 2019-09-20 DIAGNOSIS — Z21 Asymptomatic human immunodeficiency virus [HIV] infection status: Secondary | ICD-10-CM | POA: Diagnosis not present

## 2019-09-20 DIAGNOSIS — L608 Other nail disorders: Secondary | ICD-10-CM | POA: Diagnosis not present

## 2019-09-20 DIAGNOSIS — L649 Androgenic alopecia, unspecified: Secondary | ICD-10-CM | POA: Diagnosis not present

## 2019-09-20 DIAGNOSIS — L811 Chloasma: Secondary | ICD-10-CM | POA: Diagnosis not present

## 2019-10-08 DIAGNOSIS — I10 Essential (primary) hypertension: Secondary | ICD-10-CM | POA: Diagnosis not present

## 2019-10-08 DIAGNOSIS — I255 Ischemic cardiomyopathy: Secondary | ICD-10-CM | POA: Diagnosis not present

## 2019-10-08 DIAGNOSIS — K219 Gastro-esophageal reflux disease without esophagitis: Secondary | ICD-10-CM | POA: Diagnosis not present

## 2019-10-08 DIAGNOSIS — N182 Chronic kidney disease, stage 2 (mild): Secondary | ICD-10-CM | POA: Diagnosis not present

## 2019-10-08 DIAGNOSIS — R1310 Dysphagia, unspecified: Secondary | ICD-10-CM | POA: Diagnosis not present

## 2019-10-08 DIAGNOSIS — L819 Disorder of pigmentation, unspecified: Secondary | ICD-10-CM | POA: Diagnosis not present

## 2019-10-08 DIAGNOSIS — E785 Hyperlipidemia, unspecified: Secondary | ICD-10-CM | POA: Diagnosis not present

## 2019-10-08 DIAGNOSIS — D696 Thrombocytopenia, unspecified: Secondary | ICD-10-CM | POA: Diagnosis not present

## 2019-10-08 DIAGNOSIS — B20 Human immunodeficiency virus [HIV] disease: Secondary | ICD-10-CM | POA: Diagnosis not present

## 2019-10-08 DIAGNOSIS — K30 Functional dyspepsia: Secondary | ICD-10-CM | POA: Diagnosis not present

## 2019-10-08 DIAGNOSIS — I5022 Chronic systolic (congestive) heart failure: Secondary | ICD-10-CM | POA: Diagnosis not present

## 2019-10-08 DIAGNOSIS — Z95810 Presence of automatic (implantable) cardiac defibrillator: Secondary | ICD-10-CM | POA: Diagnosis not present

## 2019-10-10 ENCOUNTER — Ambulatory Visit (INDEPENDENT_AMBULATORY_CARE_PROVIDER_SITE_OTHER): Payer: Medicare Other | Admitting: *Deleted

## 2019-10-10 DIAGNOSIS — I255 Ischemic cardiomyopathy: Secondary | ICD-10-CM | POA: Diagnosis not present

## 2019-10-10 LAB — CUP PACEART REMOTE DEVICE CHECK
Battery Remaining Longevity: 60 mo
Battery Remaining Percentage: 54 %
Battery Voltage: 2.99 V
Brady Statistic RV Percent Paced: 1 %
Date Time Interrogation Session: 20210303020020
HighPow Impedance: 68 Ohm
HighPow Impedance: 68 Ohm
Implantable Lead Implant Date: 20160202
Implantable Lead Location: 753860
Implantable Pulse Generator Implant Date: 20160202
Lead Channel Impedance Value: 390 Ohm
Lead Channel Pacing Threshold Amplitude: 1 V
Lead Channel Pacing Threshold Pulse Width: 0.5 ms
Lead Channel Sensing Intrinsic Amplitude: 10.7 mV
Lead Channel Setting Pacing Amplitude: 2.5 V
Lead Channel Setting Pacing Pulse Width: 0.5 ms
Lead Channel Setting Sensing Sensitivity: 0.5 mV
Pulse Gen Serial Number: 7229147

## 2019-10-10 NOTE — Progress Notes (Signed)
ICD Remote  

## 2019-10-31 DIAGNOSIS — Z0001 Encounter for general adult medical examination with abnormal findings: Secondary | ICD-10-CM | POA: Diagnosis not present

## 2019-10-31 DIAGNOSIS — Z131 Encounter for screening for diabetes mellitus: Secondary | ICD-10-CM | POA: Diagnosis not present

## 2019-10-31 DIAGNOSIS — Z125 Encounter for screening for malignant neoplasm of prostate: Secondary | ICD-10-CM | POA: Diagnosis not present

## 2019-10-31 DIAGNOSIS — R195 Other fecal abnormalities: Secondary | ICD-10-CM | POA: Diagnosis not present

## 2019-11-06 ENCOUNTER — Encounter (HOSPITAL_COMMUNITY): Payer: Self-pay

## 2019-11-06 ENCOUNTER — Ambulatory Visit (INDEPENDENT_AMBULATORY_CARE_PROVIDER_SITE_OTHER): Payer: Medicare Other

## 2019-11-06 ENCOUNTER — Other Ambulatory Visit: Payer: Self-pay

## 2019-11-06 ENCOUNTER — Ambulatory Visit (HOSPITAL_COMMUNITY)
Admission: EM | Admit: 2019-11-06 | Discharge: 2019-11-06 | Disposition: A | Discharge status: Home / Self Care | Payer: Medicare Other

## 2019-11-06 DIAGNOSIS — M25521 Pain in right elbow: Secondary | ICD-10-CM

## 2019-11-06 DIAGNOSIS — S59901A Unspecified injury of right elbow, initial encounter: Secondary | ICD-10-CM | POA: Diagnosis not present

## 2019-11-06 MED ORDER — NAPROXEN 375 MG PO TABS: 375.0000 mg | ORAL_TABLET | Freq: Two times a day (BID) | ORAL | 0 refills | Status: DC

## 2019-11-06 NOTE — ED Provider Notes (Signed)
[place]    CSN: [id number] Arrival date & time: 11/06/19  [id number]      History   Chief Complaint Chief Complaint  Patient presents with  . elbow pain    HPI Richard [name] is a 64 y.o. male history of CAD, HIV, hypertension, cardiomyopathy, presenting today for evaluation of right elbow pain.  Patient notes 1 week ago he was reaching behind his back with elbow bent and felt a popping sensation.  Since he has had pain in his elbow especially with movement.  He denies any significant decrease in range of motion.  Denies numbness or tingling.  Feels the pain radiate more proximally and into his shoulder.  Using ibuprofen.  Concerned about possible ligament/tendon injury as he felt similar with rupturing a tendon in his thumb.  HPI  Past Medical History:  Diagnosis Date  . Coronary artery disease    a. cath 04/02/2014 3v dx 80-90% in D1, 80-90% mid LAD, 95% apical LAD, 95% distal inferior LV branch of LCx, total occlusion of prox RCA  b). Cath 04/03/2014 DES to mid LAD, DES x3 to prox D1, DAPT indefinitely. LCx lesion untreated  . HIV (human immunodeficiency virus infection) ([place])   . Hypertension   . Ischemic cardiomyopathy 2015   EF 20-25 percent by echo in 04/2014, s/p St. Jude Fortify Assura model [id number] serial number [id number]  . Stroke [facility])     Patient Active Problem List   Diagnosis Date Noted  . Chronic combined systolic (congestive) and diastolic (congestive) heart failure ([place]) 12/30/2015  . Traumatic rupture of collateral ligament of other finger at metacarpophalangeal and interphalangeal joint, subsequent encounter 10/30/2015  . Traumatic rupture of collateral ligament of other finger at metacarpophalangeal and interphalangeal joint, initial encounter 08/12/2015  . NSVT (nonsustained ventricular tachycardia) ([place]) 12/31/2014  . ICD (implantable cardioverter-defibrillator) in place 09/10/2014  . At risk for sudden cardiac death 09-04-14  . 3-vessel  coronary artery disease 04/04/2014  . Unstable angina ([place]) 04/03/2014  . Cardiomyopathy, ischemic 03/22/2014  . History of CVA (cerebrovascular accident) 02/20/2014  . Human immunodeficiency virus (HIV) disease ([place]) 02/20/2014  . Essential hypertension 02/20/2014  . Hyperlipidemia LDL goal <70 02/20/2014  . Depression 02/20/2014    Past Surgical History:  Procedure Laterality Date  . CARDIAC CATHETERIZATION  04/02/2014   DR Claiborne Billings  . ESOPHAGOGASTRODUODENOSCOPY (EGD) WITH PROPOFOL N/A 09/08/2018   Procedure: ESOPHAGOGASTRODUODENOSCOPY (EGD) WITH PROPOFOL;  Surgeon: Carol Ada, MD;  [facility];  Service: Endoscopy;  Laterality: N/A;  . IMPLANTABLE CARDIOVERTER DEFIBRILLATOR IMPLANT N/A 09/10/2014   Procedure: IMPLANTABLE CARDIOVERTER DEFIBRILLATOR IMPLANT;  Surgeon: Sanda Klein, MD;  [facility] [place] model [id number] serial number [id number]  . LEFT AND RIGHT HEART CATHETERIZATION WITH CORONARY ANGIOGRAM N/A 04/02/2014   Procedure: LEFT AND RIGHT HEART CATHETERIZATION WITH CORONARY ANGIOGRAM;  Surgeon: Troy Sine, MD;  Location: [facility] CATH LAB;  Service: Cardiovascular;  Laterality: N/A;  . MEDIAL COLLATERAL LIGAMENT REPAIR, KNEE Right 08/25/2015   Procedure: RIGHT THUMB RADICAL COLLATERAL LIGAMENT REPAIR;  Surgeon: Charlotte Crumb, MD;  Location: [place];  Service: Orthopedics;  Laterality: Right;  . PERCUTANEOUS CORONARY STENT INTERVENTION (PCI-S) N/A 04/03/2014   Procedure: PERCUTANEOUS CORONARY STENT INTERVENTION (PCI-S);  Surgeon: Peter M Martinique, MD;  Location: [facility] CATH LAB;  Service: Cardiovascular;  Laterality: N/A;  . SAVORY DILATION N/A 09/08/2018   Procedure: SAVORY DILATION;  Surgeon: Carol Ada, MD;  [facility];  Service: Endoscopy;  Laterality: N/A;       Home  Medications    Prior to Admission medications   Medication Sig Start Date End Date Taking? Authorizing Provider  abacavir-dolutegravir-lamiVUDine (TRIUMEQ) 600-50-300 MG tablet Take 1  tablet by mouth daily.   Yes [provider]  aspirin 81 MG chewable tablet Chew 81 mg by mouth daily.   Yes [provider]  Azelaic Acid 15 % cream azelaic acid 15 % topical gel  APPLY A SMALL AMOUNT ON FACE AT NIGHT TIME TWICE A WEEK 09/20/19  Yes [provider]  cetirizine (ZYRTEC) 10 MG tablet Take 10 mg by mouth daily.   Yes [provider]  DULoxetine (CYMBALTA) 30 MG capsule Take 30 mg by mouth 2 (two) times daily.   Yes [provider]  fluticasone (FLONASE) 50 MCG/ACT nasal spray Place 2 sprays into both nostrils daily as needed for rhinitis.   Yes [provider]  losartan (COZAAR) 50 MG tablet Take 50 mg by mouth daily.    Yes [provider]  metoprolol succinate (TOPROL-XL) 100 MG 24 hr tablet Take 50 mg by mouth daily. Take with or immediately following a meal.   Yes [provider]  risperiDONE (RISPERDAL) 1 MG tablet Take 1.5 mg by mouth at bedtime.   Yes [provider]  rosuvastatin (CRESTOR) 20 MG tablet Take 20 mg by mouth daily.   Yes [provider]  DULoxetine (CYMBALTA) 30 MG capsule duloxetine 30 mg capsule,delayed release  Take 1 capsule twice a day by oral route.    [provider]  Lactobacillus (ACIDOPHILUS/PECTIN PO) Take 1 capsule by mouth daily.    [provider]  metoprolol succinate (TOPROL-XL) 25 MG 24 hr tablet metoprolol succinate ER 25 mg tablet,extended release 24 hr  TAKE 1 TABLET BY MOUTH ONCE DAILY FOR 90 DAYS    [provider]  Multiple Vitamins-Minerals (CENTRUM SILVER PO) Take 2 tablets by mouth daily.    [provider]  naproxen (NAPROSYN) 375 MG tablet Take 1 tablet (375 mg total) by mouth 2 (two) times daily. 11/06/19   Wieters, Hallie C, PA-C  Omega-3 Fatty Acids (FISH OIL) 1000 MG CAPS Take by mouth.    [provider]  pantoprazole (PROTONIX) 40 MG tablet Take 1 tablet (40 mg total) by mouth daily as needed.  Patient taking differently: Take 40 mg by mouth daily.  04/04/14   Almyra Deforest, PA  Polyvinyl Alcohol-Povidone PF (REFRESH) 1.4-0.6 % SOLN Place 1 drop into both eyes 6 (six) times daily.    [provider]    Family History Family History  Problem Relation Age of Onset  . Heart disease Mother     Social History Social History   Tobacco Use  . Smoking status: Never Smoker  . Smokeless tobacco: Never Used  Substance Use Topics  . Alcohol use: No  . Drug use: No     Allergies   Lisinopril, Ioxaglate, and Ivp dye [iodinated diagnostic agents]   Review of Systems Review of Systems  Constitutional: Negative for fatigue and fever.  Eyes: Negative for redness, itching and visual disturbance.  Respiratory: Negative for shortness of breath.   Cardiovascular: Negative for chest pain and leg swelling.  Gastrointestinal: Negative for nausea and vomiting.  Musculoskeletal: Positive for arthralgias and myalgias.  Skin: Negative for color change, rash and wound.  Neurological: Negative for dizziness, syncope, weakness, light-headedness and headaches.     Physical Exam Triage Vital Signs ED Triage Vitals  Enc Vitals Group     BP 11/06/19 0959 103/71  Pulse Rate 11/06/19 0959 81     Resp 11/06/19 0959 16     Temp 11/06/19 0959 98 F (36.7 C)     Temp Source 11/06/19 0959 Oral     SpO2 11/06/19 0959 98 %     Weight --      Height --      Head Circumference --      Peak Flow --      Pain Score 11/06/19 1002 7     Pain Loc --      Pain Edu? --      Excl. in [place]? --    No data found.  Updated Vital Signs BP 103/71 (BP Location: Left Arm)   Pulse 81   Temp 98 F (36.7 C) (Oral)   Resp 16   SpO2 98%   Visual Acuity Right Eye Distance:   Left Eye Distance:   Bilateral Distance:    Right Eye Near:   Left Eye Near:    Bilateral Near:     Physical Exam Vitals and nursing note reviewed.  Constitutional:      Appearance: He is well-developed.      Comments: No acute distress  HENT:     Head: Normocephalic and atraumatic.     Nose: Nose normal.  Eyes:     Conjunctiva/sclera: Conjunctivae normal.  Cardiovascular:     Rate and Rhythm: Normal rate.  Pulmonary:     Effort: Pulmonary effort is normal. No respiratory distress.  Abdominal:     General: There is no distension.  Musculoskeletal:        General: Normal range of motion.     Cervical back: Neck supple.     Comments: Right elbow: No obvious swelling deformity or discoloration, nontender to palpation to antecubital fossa, mild tenderness to medial epicondyles extending superiorly, point of maximal tenderness just superior to the olecranon area at distal triceps, nontender over olecranon, lateral epicondyle Full active range of motion Significant pain with resisted flexion, strength intact Grip and her strength 5/5 and equal bilaterally Radial pulse 2+  Skin:    General: Skin is warm and dry.  Neurological:     Mental Status: He is alert and oriented to person, place, and time.      UC Treatments / Results  Labs (all labs ordered are listed, but only abnormal results are displayed) Labs Reviewed - No data to display  EKG   Radiology DG Elbow Complete Right  Result Date: 11/06/2019 CLINICAL DATA:  Injury EXAM: RIGHT ELBOW - COMPLETE 3+ VIEW COMPARISON:  None. FINDINGS: There is no evidence of fracture, dislocation, or joint effusion. There is no evidence of arthropathy or other focal bone abnormality. Soft tissues are unremarkable. IMPRESSION: No fracture or dislocation of the right elbow. No elbow joint effusion. No significant arthrosis. Electronically Signed   By: Eddie Candle M.D.   On: 11/06/2019 10:38    Procedures Procedures (including critical care time)  Medications Ordered in UC Medications - No data to display  Initial Impression / Assessment and Plan / UC Course  I have reviewed the triage vital signs and the nursing notes.  Pertinent labs &  imaging results that were available during my care of the patient were reviewed by me and considered in my medical decision making (see chart for details).     X-ray negative for acute fracture, no sign of avulsion fracture suggesting tendon injury.  Recommending anti-inflammatories icing and rest, follow-up with sports medicine for persistent symptoms for  further evaluation of possible tendon injury.  Tylenol for pain, may use Naprosyn sparingly.  Discussed strict return precautions. Patient verbalized understanding and is agreeable with plan.  Final Clinical Impressions(s) / UC Diagnoses   Final diagnoses:  Right elbow pain     Discharge Instructions     Xray normal Try naprosyn twice daily for the next week Ice  Ace wrap for compression Follow up with sports medicine if persisting    ED Prescriptions    Medication Sig Dispense Auth. Provider   naproxen (NAPROSYN) 375 MG tablet Take 1 tablet (375 mg total) by mouth 2 (two) times daily. 20 tablet Wieters, [place] C, PA-C     PDMP not reviewed this encounter.   Janith Lima, [place] 11/06/19 1119

## 2019-11-06 NOTE — Discharge Instructions (Signed)
Xray normal Try naprosyn twice daily for the next week Ice  Ace wrap for compression Follow up with sports medicine if persisting

## 2019-11-06 NOTE — ED Triage Notes (Signed)
Pt c/o right elbow pain for approx 1 week. Denies known trauma/injury to area. Pt reports that when bending his elbow with hand behind head, he heard a "pop" in the elbow area and felt pain. Reports pain with arm extension and flexion at posterior portion of elbow. Also c/o pain to right upper arm and right shoulder areas.  Took ibuprofen Saturday with some improvement in pain but ran out of medication.

## 2020-01-09 ENCOUNTER — Ambulatory Visit (INDEPENDENT_AMBULATORY_CARE_PROVIDER_SITE_OTHER): Payer: Medicare Other | Admitting: *Deleted

## 2020-01-09 DIAGNOSIS — I5042 Chronic combined systolic (congestive) and diastolic (congestive) heart failure: Secondary | ICD-10-CM

## 2020-01-09 DIAGNOSIS — I255 Ischemic cardiomyopathy: Secondary | ICD-10-CM | POA: Diagnosis not present

## 2020-01-09 LAB — CUP PACEART REMOTE DEVICE CHECK
Battery Remaining Longevity: 59 mo
Battery Remaining Percentage: 53 %
Battery Voltage: 2.99 V
Brady Statistic RV Percent Paced: 1 %
Date Time Interrogation Session: 20210602020030
HighPow Impedance: 59 Ohm
HighPow Impedance: 59 Ohm
Implantable Lead Implant Date: 20160202
Implantable Lead Location: 753860
Implantable Pulse Generator Implant Date: 20160202
Lead Channel Impedance Value: 340 Ohm
Lead Channel Pacing Threshold Amplitude: 1 V
Lead Channel Pacing Threshold Pulse Width: 0.5 ms
Lead Channel Sensing Intrinsic Amplitude: 9.9 mV
Lead Channel Setting Pacing Amplitude: 2.5 V
Lead Channel Setting Pacing Pulse Width: 0.5 ms
Lead Channel Setting Sensing Sensitivity: 0.5 mV
Pulse Gen Serial Number: 7229147

## 2020-01-10 NOTE — Progress Notes (Signed)
Remote ICD transmission.   

## 2020-01-16 ENCOUNTER — Ambulatory Visit (INDEPENDENT_AMBULATORY_CARE_PROVIDER_SITE_OTHER): Payer: Medicare Other | Admitting: Cardiovascular Disease

## 2020-01-16 ENCOUNTER — Encounter: Payer: Self-pay | Admitting: Cardiovascular Disease

## 2020-01-16 ENCOUNTER — Other Ambulatory Visit: Payer: Self-pay

## 2020-01-16 VITALS — BP 122/88 | HR 82 | Ht 70.0 in | Wt 202.0 lb

## 2020-01-16 DIAGNOSIS — E785 Hyperlipidemia, unspecified: Secondary | ICD-10-CM

## 2020-01-16 DIAGNOSIS — I2510 Atherosclerotic heart disease of native coronary artery without angina pectoris: Secondary | ICD-10-CM | POA: Diagnosis not present

## 2020-01-16 DIAGNOSIS — I5042 Chronic combined systolic (congestive) and diastolic (congestive) heart failure: Secondary | ICD-10-CM | POA: Diagnosis not present

## 2020-01-16 DIAGNOSIS — B20 Human immunodeficiency virus [HIV] disease: Secondary | ICD-10-CM

## 2020-01-16 DIAGNOSIS — Z79899 Other long term (current) drug therapy: Secondary | ICD-10-CM

## 2020-01-16 DIAGNOSIS — I472 Ventricular tachycardia, unspecified: Secondary | ICD-10-CM

## 2020-01-16 DIAGNOSIS — I255 Ischemic cardiomyopathy: Secondary | ICD-10-CM

## 2020-01-16 DIAGNOSIS — Z95810 Presence of automatic (implantable) cardiac defibrillator: Secondary | ICD-10-CM | POA: Diagnosis not present

## 2020-01-16 DIAGNOSIS — I10 Essential (primary) hypertension: Secondary | ICD-10-CM | POA: Diagnosis not present

## 2020-01-16 NOTE — Progress Notes (Signed)
Cardiology office note    Date:  01/18/2020   ID:  [name], DOB Mar 07, 1956, MRN [id number]  PCP:  Har[place], MD  Cardiologist:  Nicki Guadalajara, MD ; Neave Lenger (device) Electrophysiologist:  None   Evaluation Performed:  Follow-Up Visit  Chief Complaint:  ICD follow-up  History of Present Illness:    Richard [name] is a 64 y.o. male with with severe ischemic cardiomyopathy following an extensive LAD artery distribution myocardial infarction, well compensated chronic systolic and diastolic heart failure, roughly 5 years following implantation of a single-chamber St. Jude defibrillator for primary prevention.  He received an appropriate shock for ventricular fibrillation on September 30, 2016, after he been out of his metoprolol for about a week.  He has not had any ventricular events since then.  The patient specifically denies any chest pain at rest exertion, dyspnea at rest or with exertion, orthopnea, paroxysmal nocturnal dyspnea, syncope, palpitations, focal neurological deficits, intermittent claudication, lower extremity edema, unexplained weight gain, cough, hemoptysis or wheezing.  He has not required hospitalization for heart failure or adjustment in diuretic dose.  He has not had defibrillator discharges.  His most recent nuclear stress test in 2015 showed extensive scar without ischemia and the left ventricular ejection fraction of 27%.His most recent echocardiogram in May 2019 showed an EF of 25-30% with grade 2 diastolic dysfunction.  Comprehensive device check today shows normal function.  Estimated battery longevity is just under 5 years.  He has not had any ventricular tachycardia, either sustained or nonsustained.  He never requires ventricular pacing.  His thoracic impedance is highly volatile but has never shown any correlation with clinical heart failure exacerbation.  Cybersecurity upgrade was performed today.  His lipid profile is checked at the  [place] and was "good".  It is unclear whether he is taking rosuvastatin or atorvastatin, both are listed.  He will try to get [place] a copy of his labs and call us to tell us exactly which medication he is taking.  He has chronic HIV infection with undetectable virus on antiretroviral therapy, also followed at the [place].  He has had esophagoplasty by Dr. Elnoria Howard due to stricture from gastroesophageal reflux disease.  The patient does not have symptoms concerning for COVID-19 infection (fever, chills, cough, or new shortness of breath).    Past Medical History:  Diagnosis Date  . Coronary artery disease    a. cath 04/02/2014 3v dx 80-90% in D1, 80-90% mid LAD, 95% apical LAD, 95% distal inferior LV branch of LCx, total occlusion of prox RCA  b). Cath 04/03/2014 DES to mid LAD, DES x3 to prox D1, DAPT indefinitely. LCx lesion untreated  . HIV (human immunodeficiency virus infection) (HCC)   . Hypertension   . Ischemic cardiomyopathy 2015   EF 20-25 percent by echo in 04/2014, s/p St. Jude Fortify Assura model [id number] serial number [id number]  . Stroke [facility])    Past Surgical History:  Procedure Laterality Date  . CARDIAC CATHETERIZATION  04/02/2014   DR Tresa Endo  . ESOPHAGOGASTRODUODENOSCOPY (EGD) WITH PROPOFOL N/A 09/08/2018   Procedure: ESOPHAGOGASTRODUODENOSCOPY (EGD) WITH PROPOFOL;  Surgeon: Jeani Hawking, MD;  Location: [facility] ENDOSCOPY;  Service: Endoscopy;  Laterality: N/A;  . IMPLANTABLE CARDIOVERTER DEFIBRILLATOR IMPLANT N/A 09/10/2014   Procedure: IMPLANTABLE CARDIOVERTER DEFIBRILLATOR IMPLANT;  Surgeon: Thurmon Fair, MD;  [facility] model [phone] serial number [id number]  . LEFT AND RIGHT HEART CATHETERIZATION WITH CORONARY ANGIOGRAM N/A 04/02/2014   Procedure: LEFT AND RIGHT HEART CATHETERIZATION WITH CORONARY  ANGIOGRAM;  Surgeon: Lennette Bihari, MD;  [facility] C[facility] LAB;  Service: Cardiovascular;  Laterality: N/A;  . MEDIAL COLLATERAL LIGAMENT REPAIR, KNEE Right 08/25/2015   Procedure: RIGHT  THUMB RADICAL COLLATERAL LIGAMENT REPAIR;  Surgeon: Dairl Ponder, MD;  Location: [facility] OR;  Service: Orthopedics;  Laterality: Right;  . PERCUTANEOUS CORONARY STENT INTERVENTION (PCI-S) N/A 04/03/2014   Procedure: PERCUTANEOUS CORONARY STENT INTERVENTION (PCI-S);  Surgeon: Peter M Swaziland, MD;  Location: [facility] CATH LAB;  Service: Cardiovascular;  Laterality: N/A;  . SAVORY DILATION N/A 09/08/2018   Procedure: SAVORY DILATION;  Surgeon: Jeani Hawking, MD;  Location: [facility] ENDOSCOPY;  Service: Endoscopy;  Laterality: N/A;     Current Meds  Medication Sig  . abacavir-dolutegravir-lamiVUDine (TRIUMEQ) 600-50-300 MG tablet Take 1 tablet by mouth daily.  [name] aspirin 81 MG chewable tablet Chew 81 mg by mouth daily.  . Azelaic Acid 15 % cream azelaic acid 15 % topical gel  APPLY A SMALL AMOUNT ON FACE AT NIGHT TIME TWICE A WEEK  . cetirizine (ZYRTEC) 10 MG tablet Take 10 mg by mouth daily.  . DULoxetine (CYMBALTA) 30 MG capsule Take 30 mg by mouth 2 (two) times daily.  . DULoxetine (CYMBALTA) 30 MG capsule duloxetine 30 mg capsule,delayed release  Take 1 capsule twice a day by oral route.  . fluticasone (FLONASE) 50 MCG/ACT nasal spray Place 2 sprays into both nostrils daily as needed for rhinitis.  . Lactobacillus (ACIDOPHILUS/PECTIN PO) Take 1 capsule by mouth daily.  [name] losartan (COZAAR) 50 MG tablet Take 50 mg by mouth daily.   . metoprolol succinate (TOPROL-XL) 100 MG 24 hr tablet Take 50 mg by mouth daily. Take with or immediately following a meal.  . metoprolol succinate (TOPROL-XL) 25 MG 24 hr tablet metoprolol succinate ER 25 mg tablet,extended release 24 hr  TAKE 1 TABLET BY MOUTH ONCE DAILY FOR 90 DAYS  . Multiple Vitamins-Minerals (CENTRUM SILVER PO) Take 2 tablets by mouth daily.  . naproxen (NAPROSYN) 375 MG tablet Take 1 tablet (375 mg total) by mouth 2 (two) times daily.  . Omega-3 Fatty Acids (FISH OIL) 1000 MG CAPS Take by mouth.  . pantoprazole (PROTONIX) 40 MG tablet Take 1 tablet (40 mg  total) by mouth daily as needed. (Patient taking differently: Take 40 mg by mouth daily. )  . Polyvinyl Alcohol-Povidone PF (REFRESH) 1.4-0.6 % SOLN Place 1 drop into both eyes 6 (six) times daily.  . risperiDONE (RISPERDAL) 1 MG tablet Take 1.5 mg by mouth at bedtime.  . rosuvastatin (CRESTOR) 20 MG tablet Take 20 mg by mouth daily.  . [DISCONTINUED] atorvastatin (LIPITOR) 20 MG tablet Take 20 mg by mouth daily.     Allergies:   Lisinopril, Ioxaglate, and Ivp dye [iodinated diagnostic agents]   Social History   Tobacco Use  . Smoking status: Never Smoker  . Smokeless tobacco: Never Used  Substance Use Topics  . Alcohol use: No  . Drug use: No     Family Hx: The patient's family history includes Heart disease in his mother.  ROS:   Please see the history of present illness.    All other systems are reviewed and are negative.   Prior CV studies:   The following studies were reviewed today: Notes from Dr. Daphene Jaeger, comprehensive ICD check  Labs/Other Tests and Data Reviewed:    EKG:  An ECG dated 12/09/2017 was personally reviewed today and demonstrated:  Normal sinus rhythm, rare PVCs, old inferior infarction.  Normal QTC 401 ms  An ECG was ordered today and shows sinus rhythm, left axis deviation, old inferior infarction, T wave inversion in leads V3-V6, normal QTC 436 ms.  Recent Labs: 01/17/2020: ALT 39; BUN 17; Creatinine, Ser 1.51; Potassium 4.7; Sodium 138   Recent Lipid Panel Lab Results  Component Value Date/Time   CHOL 157 01/17/2020 09:05 AM   TRIG 81 01/17/2020 09:05 AM   HDL 36 (L) 01/17/2020 09:05 AM   CHOLHDL 4.4 01/17/2020 09:05 AM   CHOLHDL 3.9 09/05/2015 08:48 AM   LDLCALC 105 (H) 01/17/2020 09:05 AM    Wt Readings from Last 3 Encounters:  01/16/20 202 lb (91.6 kg)  12/13/18 190 lb (86.2 kg)  12/11/18 189 lb (85.7 kg)     Objective:    Vital Signs:  BP 122/88   Pulse 82   Ht 5\' 10"  (1.778 m)   Wt 202 lb (91.6 kg)   SpO2 100%   BMI 28.98  kg/m     General: Alert, oriented x3, no distress, healthy left subclavian defibrillator site Head: no evidence of trauma, PERRL, EOMI, no exophtalmos or lid lag, no myxedema, no xanthelasma; normal ears, nose and oropharynx Neck: normal jugular venous pulsations and no hepatojugular reflux; brisk carotid pulses without delay and no carotid bruits Chest: clear to auscultation, no signs of consolidation by percussion or palpation, normal fremitus, symmetrical and full respiratory excursions Cardiovascular: normal position and quality of the apical impulse, regular rhythm, normal first and second heart sounds, no murmurs, rubs or gallops Abdomen: no tenderness or distention, no masses by palpation, no abnormal pulsatility or arterial bruits, normal bowel sounds, no hepatosplenomegaly Extremities: no clubbing, cyanosis or edema; 2+ radial, ulnar and brachial pulses bilaterally; 2+ right femoral, posterior tibial and dorsalis pedis pulses; 2+ left femoral, posterior tibial and dorsalis pedis pulses; no subclavian or femoral bruits Neurological: grossly nonfocal Psych: Normal mood and affect   ASSESSMENT & PLAN:    1. CHF: Well compensated, NYHA functional class I, not taking daily diuretics.  He is on appropriate medication, ARB and beta-blocker (Entresto considered but not initiated yet).  His Corvue does not appear to reflect true heart failure status. 2. CAD: Asymptomatic on beta-blocker as his only antianginal.  No longer takes nitrates. 3. VT: Had an appropriate defibrillator shock in the setting of beta-blocker withdrawal in early 2018.  No events since then.  Reviewed the importance of careful continuous use of beta-blocker therapy to avoid a recurrence of these events. 4. ICD: Normal device check.  Continue remote downloads every 3 months 5. HLP: On statin.  Target LDL less than 70.  Labs checked at the [place].  Need to clarify which statin he is taking. 6. HTN: Well-controlled. 7. HIV:  Reports undetectable viral load at last test.   COVID-19 Education: The signs and symptoms of COVID-19 were discussed with the patient and how to seek care for testing (follow up with PCP or arrange E-visit).  The importance of social distancing was discussed today.  Time:   Today, I have spent 21 minutes with the patient with telehealth technology discussing the above problems.     Medication Adjustments/Labs and Tests Ordered: Current medicines are reviewed at length with the patient today.  Concerns regarding medicines are outlined above.   Tests Ordered: Orders Placed This Encounter  Procedures  . Comprehensive Metabolic Panel (CMET)  . Lipid panel  . EKG 12-Lead    Medication Changes: No orders of the defined types were placed in this encounter.   Disposition:  Follow up Remote downloads every 3 months and office visit in a year.  Signed, Thurmon Fair, MD  01/18/2020 2:58 PM    [facility]

## 2020-01-16 NOTE — Patient Instructions (Signed)
Medication Instructions:  Continue current medications  *If you need a refill on your cardiac medications before your next appointment, please call your pharmacy*   Lab Work: CMP and Fasting Lipid  If you have labs (blood work) drawn today and your tests are completely normal, you will receive your results only by: [name] MyChart Message (if you have MyChart) OR . A paper copy in the mail If you have any lab test that is abnormal or we need to change your treatment, we will call you to review the results.   Testing/Procedures: None Ordered   Follow-Up: At [facility], you and your health needs are our priority.  As part of our continuing mission to provide you with exceptional heart care, we have created designated Provider Care Teams.  These Care Teams include your primary Cardiologist (physician) and Advanced Practice Providers (APPs -  Physician Assistants and Nurse Practitioners) who all work together to provide you with the care you need, when you need it.  We recommend signing up for the patient portal called "MyChart".  Sign up information is provided on this After Visit Summary.  MyChart is used to connect with patients for Virtual Visits (Telemedicine).  Patients are able to view lab/test results, encounter notes, upcoming appointments, etc.  Non-urgent messages can be sent to your provider as well.   To learn more about what you can do with MyChart, go to [email or web address].    Your next appointment:   1 year(s)  The format for your next appointment:   In Person  Provider:   You may see Alyson Locket, MD or one of the following Advanced Practice Providers on your designated Care Team:    Almyra Deforest, PA-C  Fabian Sharp, PA-C or   Roby Lofts, [place]

## 2020-01-17 DIAGNOSIS — E785 Hyperlipidemia, unspecified: Secondary | ICD-10-CM | POA: Diagnosis not present

## 2020-01-17 DIAGNOSIS — I10 Essential (primary) hypertension: Secondary | ICD-10-CM | POA: Diagnosis not present

## 2020-01-17 DIAGNOSIS — Z79899 Other long term (current) drug therapy: Secondary | ICD-10-CM | POA: Diagnosis not present

## 2020-01-17 LAB — LIPID PANEL
Chol/HDL Ratio: 4.4 ratio (ref 0.0–5.0)
Cholesterol, Total: 157 mg/dL (ref 100–199)
HDL: 36 mg/dL — ABNORMAL LOW (ref >39)
LDL Chol Calc (NIH): 105 mg/dL — ABNORMAL HIGH (ref 0–99)
Triglycerides: 81 mg/dL (ref 0–149)
VLDL Cholesterol Cal: 16 mg/dL (ref 5–40)

## 2020-01-17 LAB — COMPREHENSIVE METABOLIC PANEL
ALT: 39 IU/L (ref 0–44)
AST: 32 IU/L (ref 0–40)
Albumin/Globulin Ratio: 1.5 (ref 1.2–2.2)
Albumin: 4.7 g/dL (ref 3.8–4.8)
Alkaline Phosphatase: 82 IU/L (ref 48–121)
BUN/Creatinine Ratio: 11 (ref 10–24)
BUN: 17 mg/dL (ref 8–27)
Bilirubin Total: 0.5 mg/dL (ref 0.0–1.2)
CO2: 21 mmol/L (ref 20–29)
Calcium: 9.8 mg/dL (ref 8.6–10.2)
Chloride: 102 mmol/L (ref 96–106)
Creatinine, Ser: 1.51 mg/dL — ABNORMAL HIGH (ref 0.76–1.27)
GFR calc Af Amer: 56 mL/min/{1.73_m2} — ABNORMAL LOW (ref >59)
GFR calc non Af Amer: 49 mL/min/{1.73_m2} — ABNORMAL LOW (ref >59)
Globulin, Total: 3.1 g/dL (ref 1.5–4.5)
Glucose: 96 mg/dL (ref 65–99)
Potassium: 4.7 mmol/L (ref 3.5–5.2)
Sodium: 138 mmol/L (ref 134–144)
Total Protein: 7.8 g/dL (ref 6.0–8.5)

## 2020-01-18 ENCOUNTER — Encounter: Payer: Self-pay | Admitting: *Deleted

## 2020-01-18 ENCOUNTER — Telehealth: Payer: Self-pay | Admitting: *Deleted

## 2020-01-18 DIAGNOSIS — E785 Hyperlipidemia, unspecified: Secondary | ICD-10-CM

## 2020-01-18 MED ORDER — ATORVASTATIN CALCIUM 40 MG PO TABS: 40.0000 mg | ORAL_TABLET | Freq: Every day | ORAL | 3 refills | Status: DC

## 2020-01-18 NOTE — Telephone Encounter (Signed)
Pt aware of his blood work and medication changes, Atorvastatin 40 mg send into pt pharmacy. Fasting Lipid ordered and mailed to pt.

## 2020-01-18 NOTE — Telephone Encounter (Signed)
-----   Message from Sanda Klein, MD sent at 01/17/2020  5:51 PM EDT ----- Kidney function is at baseline (mildly abnormal). Liver tests and other routine chemistries all normal. Cholesterol is too high (target LDL under 70). Please increase the atorvastatin to 40 mg daily, make sure to take every day and recheck a lipid profile in about 3 months.

## 2020-01-21 DIAGNOSIS — I10 Essential (primary) hypertension: Secondary | ICD-10-CM | POA: Diagnosis not present

## 2020-01-21 DIAGNOSIS — N1831 Chronic kidney disease, stage 3a: Secondary | ICD-10-CM | POA: Diagnosis not present

## 2020-01-21 DIAGNOSIS — I5022 Chronic systolic (congestive) heart failure: Secondary | ICD-10-CM | POA: Diagnosis not present

## 2020-01-21 DIAGNOSIS — K219 Gastro-esophageal reflux disease without esophagitis: Secondary | ICD-10-CM | POA: Diagnosis not present

## 2020-01-21 DIAGNOSIS — E663 Overweight: Secondary | ICD-10-CM | POA: Insufficient documentation

## 2020-01-21 DIAGNOSIS — R7303 Prediabetes: Secondary | ICD-10-CM | POA: Diagnosis not present

## 2020-01-21 DIAGNOSIS — R5383 Other fatigue: Secondary | ICD-10-CM | POA: Diagnosis not present

## 2020-01-21 DIAGNOSIS — Z0001 Encounter for general adult medical examination with abnormal findings: Secondary | ICD-10-CM | POA: Diagnosis not present

## 2020-01-21 DIAGNOSIS — Z125 Encounter for screening for malignant neoplasm of prostate: Secondary | ICD-10-CM | POA: Diagnosis not present

## 2020-01-21 DIAGNOSIS — D696 Thrombocytopenia, unspecified: Secondary | ICD-10-CM | POA: Diagnosis not present

## 2020-01-21 DIAGNOSIS — Z23 Encounter for immunization: Secondary | ICD-10-CM | POA: Diagnosis not present

## 2020-01-21 DIAGNOSIS — Z1211 Encounter for screening for malignant neoplasm of colon: Secondary | ICD-10-CM | POA: Diagnosis not present

## 2020-02-25 ENCOUNTER — Other Ambulatory Visit: Payer: Self-pay | Admitting: Cardiovascular Disease

## 2020-04-10 DIAGNOSIS — L811 Chloasma: Secondary | ICD-10-CM | POA: Diagnosis not present

## 2020-04-11 ENCOUNTER — Ambulatory Visit (INDEPENDENT_AMBULATORY_CARE_PROVIDER_SITE_OTHER): Payer: Medicare Other | Admitting: *Deleted

## 2020-04-11 DIAGNOSIS — I255 Ischemic cardiomyopathy: Secondary | ICD-10-CM

## 2020-04-12 LAB — CUP PACEART REMOTE DEVICE CHECK
Battery Remaining Longevity: 55 mo
Battery Remaining Percentage: 51 %
Battery Voltage: 2.98 V
Brady Statistic RV Percent Paced: 1 %
Date Time Interrogation Session: 20210901043925
HighPow Impedance: 57 Ohm
HighPow Impedance: 57 Ohm
Implantable Lead Implant Date: 20160202
Implantable Lead Location: 753860
Implantable Pulse Generator Implant Date: 20160202
Lead Channel Impedance Value: 340 Ohm
Lead Channel Pacing Threshold Amplitude: 0.75 V
Lead Channel Pacing Threshold Pulse Width: 0.5 ms
Lead Channel Sensing Intrinsic Amplitude: 9.7 mV
Lead Channel Setting Pacing Amplitude: 2.5 V
Lead Channel Setting Pacing Pulse Width: 0.5 ms
Lead Channel Setting Sensing Sensitivity: 0.5 mV
Pulse Gen Serial Number: 7229147

## 2020-04-15 NOTE — Progress Notes (Signed)
Remote ICD transmission.   

## 2020-05-22 DIAGNOSIS — Z1211 Encounter for screening for malignant neoplasm of colon: Secondary | ICD-10-CM | POA: Diagnosis not present

## 2020-05-22 DIAGNOSIS — F419 Anxiety disorder, unspecified: Secondary | ICD-10-CM | POA: Diagnosis not present

## 2020-05-22 DIAGNOSIS — R7303 Prediabetes: Secondary | ICD-10-CM | POA: Diagnosis not present

## 2020-05-22 DIAGNOSIS — B20 Human immunodeficiency virus [HIV] disease: Secondary | ICD-10-CM | POA: Diagnosis not present

## 2020-05-22 DIAGNOSIS — I5022 Chronic systolic (congestive) heart failure: Secondary | ICD-10-CM | POA: Diagnosis not present

## 2020-05-22 DIAGNOSIS — Z23 Encounter for immunization: Secondary | ICD-10-CM | POA: Diagnosis not present

## 2020-05-22 DIAGNOSIS — K219 Gastro-esophageal reflux disease without esophagitis: Secondary | ICD-10-CM | POA: Diagnosis not present

## 2020-05-22 DIAGNOSIS — M5442 Lumbago with sciatica, left side: Secondary | ICD-10-CM | POA: Diagnosis not present

## 2020-05-22 DIAGNOSIS — D696 Thrombocytopenia, unspecified: Secondary | ICD-10-CM | POA: Diagnosis not present

## 2020-05-22 DIAGNOSIS — N1831 Chronic kidney disease, stage 3a: Secondary | ICD-10-CM | POA: Diagnosis not present

## 2020-05-22 DIAGNOSIS — E785 Hyperlipidemia, unspecified: Secondary | ICD-10-CM | POA: Diagnosis not present

## 2020-05-22 DIAGNOSIS — I10 Essential (primary) hypertension: Secondary | ICD-10-CM | POA: Diagnosis not present

## 2020-06-10 ENCOUNTER — Ambulatory Visit: Payer: Medicare Other | Attending: Internal Medicine

## 2020-06-10 DIAGNOSIS — Z23 Encounter for immunization: Secondary | ICD-10-CM

## 2020-06-10 NOTE — Progress Notes (Signed)
   Covid-19 Vaccination Clinic  Name:  Richard [name]    MRN: [id number] DOB: Mar 01, 1956  06/10/2020  Richard [name] was observed post Covid-19 immunization for 15 minutes without incident. He was provided with Vaccine Information Sheet and instruction to access the V-Safe system.   Richard [name] was instructed to call 911 with any severe reactions post vaccine: [name] Difficulty breathing  . Swelling of face and throat  . A fast heartbeat  . A bad rash all over body  . Dizziness and weakness

## 2020-07-11 ENCOUNTER — Ambulatory Visit (INDEPENDENT_AMBULATORY_CARE_PROVIDER_SITE_OTHER): Payer: Medicare Other

## 2020-07-11 DIAGNOSIS — I5042 Chronic combined systolic (congestive) and diastolic (congestive) heart failure: Secondary | ICD-10-CM

## 2020-07-12 LAB — CUP PACEART REMOTE DEVICE CHECK
Battery Remaining Longevity: 53 mo
Battery Remaining Percentage: 49 %
Battery Voltage: 2.98 V
Brady Statistic RV Percent Paced: 1 %
Date Time Interrogation Session: 20211203020017
HighPow Impedance: 66 Ohm
HighPow Impedance: 66 Ohm
Implantable Lead Implant Date: 20160202
Implantable Lead Location: 753860
Implantable Pulse Generator Implant Date: 20160202
Lead Channel Impedance Value: 350 Ohm
Lead Channel Pacing Threshold Amplitude: 0.75 V
Lead Channel Pacing Threshold Pulse Width: 0.5 ms
Lead Channel Sensing Intrinsic Amplitude: 10 mV
Lead Channel Setting Pacing Amplitude: 2.5 V
Lead Channel Setting Pacing Pulse Width: 0.5 ms
Lead Channel Setting Sensing Sensitivity: 0.5 mV
Pulse Gen Serial Number: 7229147

## 2020-07-22 NOTE — Progress Notes (Signed)
Remote ICD transmission.   

## 2020-08-11 ENCOUNTER — Telehealth: Payer: Self-pay

## 2020-08-11 NOTE — Telephone Encounter (Signed)
Pt reports he had a jolt awake this morning, his wife thinks he was shocked.  Pt denies any cardiac symptoms currently.    Pt sent manual transmission, no new shock reflected, it appears pt has not been in office in a while, device data last cleared 2016, it does appear he had a shock previously, however nothing new since 07/11/2020.   Advised pt I would also send a message to scheduling to contact him to schedule appt with Dr. Royann Shivers since he is overdue.

## 2020-08-11 NOTE — Telephone Encounter (Signed)
Thanks

## 2020-08-12 NOTE — Telephone Encounter (Signed)
Patient has an appointment on 10/27/20 with Dr. Royann Shivers.

## 2020-08-23 ENCOUNTER — Other Ambulatory Visit: Payer: Self-pay

## 2020-08-23 ENCOUNTER — Emergency Department (HOSPITAL_COMMUNITY)
Admission: EM | Admit: 2020-08-23 | Discharge: 2020-08-23 | Disposition: A | Discharge status: Home / Self Care | Payer: Medicare Other | Attending: Emergency Medicine | Admitting: Emergency Medicine

## 2020-08-23 ENCOUNTER — Encounter (HOSPITAL_COMMUNITY): Payer: Self-pay | Admitting: Emergency Medicine

## 2020-08-23 ENCOUNTER — Emergency Department (HOSPITAL_COMMUNITY): Payer: Medicare Other

## 2020-08-23 DIAGNOSIS — Z21 Asymptomatic human immunodeficiency virus [HIV] infection status: Secondary | ICD-10-CM | POA: Diagnosis not present

## 2020-08-23 DIAGNOSIS — Z79899 Other long term (current) drug therapy: Secondary | ICD-10-CM | POA: Insufficient documentation

## 2020-08-23 DIAGNOSIS — R9389 Abnormal findings on diagnostic imaging of other specified body structures: Secondary | ICD-10-CM | POA: Insufficient documentation

## 2020-08-23 DIAGNOSIS — R5381 Other malaise: Secondary | ICD-10-CM | POA: Diagnosis not present

## 2020-08-23 DIAGNOSIS — I110 Hypertensive heart disease with heart failure: Secondary | ICD-10-CM | POA: Diagnosis not present

## 2020-08-23 DIAGNOSIS — Z87442 Personal history of urinary calculi: Secondary | ICD-10-CM | POA: Diagnosis not present

## 2020-08-23 DIAGNOSIS — Z743 Need for continuous supervision: Secondary | ICD-10-CM | POA: Diagnosis not present

## 2020-08-23 DIAGNOSIS — I25110 Atherosclerotic heart disease of native coronary artery with unstable angina pectoris: Secondary | ICD-10-CM | POA: Insufficient documentation

## 2020-08-23 DIAGNOSIS — I5042 Chronic combined systolic (congestive) and diastolic (congestive) heart failure: Secondary | ICD-10-CM | POA: Diagnosis not present

## 2020-08-23 DIAGNOSIS — K449 Diaphragmatic hernia without obstruction or gangrene: Secondary | ICD-10-CM | POA: Diagnosis not present

## 2020-08-23 DIAGNOSIS — R1030 Lower abdominal pain, unspecified: Secondary | ICD-10-CM | POA: Diagnosis not present

## 2020-08-23 DIAGNOSIS — R109 Unspecified abdominal pain: Secondary | ICD-10-CM | POA: Diagnosis not present

## 2020-08-23 DIAGNOSIS — Z7982 Long term (current) use of aspirin: Secondary | ICD-10-CM | POA: Diagnosis not present

## 2020-08-23 DIAGNOSIS — R1110 Vomiting, unspecified: Secondary | ICD-10-CM | POA: Diagnosis not present

## 2020-08-23 LAB — CBC
HCT: 41.1 % (ref 39.0–52.0)
Hemoglobin: 13.7 g/dL (ref 13.0–17.0)
MCH: 28.5 pg (ref 26.0–34.0)
MCHC: 33.3 g/dL (ref 30.0–36.0)
MCV: 85.4 fL (ref 80.0–100.0)
Platelets: 135 10*3/uL — ABNORMAL LOW (ref 150–400)
RBC: 4.81 MIL/uL (ref 4.22–5.81)
RDW: 14.8 % (ref 11.5–15.5)
WBC: 4.3 10*3/uL (ref 4.0–10.5)
nRBC: 0 % (ref 0.0–0.2)

## 2020-08-23 LAB — URINALYSIS, ROUTINE W REFLEX MICROSCOPIC
Bilirubin Urine: NEGATIVE
Glucose, UA: NEGATIVE mg/dL
Hgb urine dipstick: NEGATIVE
Ketones, ur: NEGATIVE mg/dL
Leukocytes,Ua: NEGATIVE
Nitrite: NEGATIVE
Protein, ur: NEGATIVE mg/dL
Specific Gravity, Urine: 1.004 — ABNORMAL LOW (ref 1.005–1.030)
pH: 7 (ref 5.0–8.0)

## 2020-08-23 LAB — COMPREHENSIVE METABOLIC PANEL
ALT: 28 U/L (ref 0–44)
AST: 26 U/L (ref 15–41)
Albumin: 3.8 g/dL (ref 3.5–5.0)
Alkaline Phosphatase: 55 U/L (ref 38–126)
Anion gap: 9 (ref 5–15)
BUN: 11 mg/dL (ref 8–23)
CO2: 24 mmol/L (ref 22–32)
Calcium: 9 mg/dL (ref 8.9–10.3)
Chloride: 104 mmol/L (ref 98–111)
Creatinine, Ser: 1.47 mg/dL — ABNORMAL HIGH (ref 0.61–1.24)
GFR, Estimated: 53 mL/min — ABNORMAL LOW (ref >60)
Glucose, Bld: 100 mg/dL — ABNORMAL HIGH (ref 70–99)
Potassium: 3.7 mmol/L (ref 3.5–5.1)
Sodium: 137 mmol/L (ref 135–145)
Total Bilirubin: 0.6 mg/dL (ref 0.3–1.2)
Total Protein: 7.1 g/dL (ref 6.5–8.1)

## 2020-08-23 LAB — LIPASE, BLOOD: Lipase: 35 U/L (ref 11–51)

## 2020-08-23 MED ORDER — MELOXICAM 7.5 MG PO TABS: 7.5000 mg | ORAL_TABLET | Freq: Two times a day (BID) | ORAL | 0 refills | Status: AC | PRN

## 2020-08-23 MED ORDER — ALUM & MAG HYDROXIDE-SIMETH 200-200-20 MG/5ML PO SUSP
30.0000 mL | Freq: Once | ORAL | Status: AC
  Administered 2020-08-23: 30 mL via ORAL
  Filled 2020-08-23: qty 30

## 2020-08-23 MED ORDER — LIDOCAINE VISCOUS HCL 2 % MT SOLN
15.0000 mL | Freq: Once | OROMUCOSAL | Status: AC
  Administered 2020-08-23: 15 mL via ORAL
  Filled 2020-08-23: qty 15

## 2020-08-23 NOTE — ED Notes (Signed)
Patient transported to CT 

## 2020-08-23 NOTE — ED Provider Notes (Signed)
[facility] EMERGENCY DEPARTMENT Provider Note   CSN: [id number] Arrival date & time: 08/23/20  1201     History Chief Complaint  Patient presents with  . Abdominal Pain    Richard [name] is a 65 y.o. male.  HPI   This patient is a 65 year old male, he has a known history of coronary disease status post stenting of his heart vessels, he also has a history of HIV and an ischemic cardiomyopathy with a stroke.  He has an implantable defibrillator and follows with cardiology.  He has never had any abdominal surgery and denies a history of gallstones, kidney stones or aneurysms.  He states that this morning around 9:30 AM he had acute onset of abdominal pain in the lower abdomen, this seems to radiate to the back, it did not make him nauseated, there is no vomiting or diarrhea though he did have some loose stools.  At maximum pain he was 9 out of 10.  Currently his symptoms are an underlying achy pain.  There was no signs of hematuria but he did have dysuria a few days ago that seemed to resolve when he drink plenty of clear liquids.  No medications given prior to arrival  Past Medical History:  Diagnosis Date  . Coronary artery disease    a. cath 04/02/2014 3v dx 80-90% in D1, 80-90% mid LAD, 95% apical LAD, 95% distal inferior LV branch of LCx, total occlusion of prox RCA  b). Cath 04/03/2014 DES to mid LAD, DES x3 to prox D1, DAPT indefinitely. LCx lesion untreated  . HIV (human immunodeficiency virus infection) ([place])   . Hypertension   . Ischemic cardiomyopathy 2015   EF 20-25 percent by echo in 04/2014, s/p [employer] Fort[employer]ura model [id number] serial number [id number]  . Stroke [facility])     Patient Active Problem List   Diagnosis Date Noted  . Chronic combined systolic (congestive) and diastolic (congestive) heart failure ([place]) 12/30/2015  . Traumatic rupture of collateral ligament of other finger at metacarpophalangeal and interphalangeal joint, subsequent  encounter 10/30/2015  . Traumatic rupture of collateral ligament of other finger at metacarpophalangeal and interphalangeal joint, initial encounter 08/12/2015  . NSVT (nonsustained ventricular tachycardia) ([place]) 12/31/2014  . ICD (implantable cardioverter-defibrillator) in place 09/10/2014  . At risk for sudden cardiac death 09-11-14  . 3-vessel coronary artery disease 04/04/2014  . Unstable angina ([place]) 04/03/2014  . Cardiomyopathy, ischemic 03/22/2014  . History of CVA (cerebrovascular accident) 02/20/2014  . Human immunodeficiency virus (HIV) disease ([place]) 02/20/2014  . Essential hypertension 02/20/2014  . Hyperlipidemia LDL goal <70 02/20/2014  . Depression 02/20/2014    Past Surgical History:  Procedure Laterality Date  . CARDIAC CATHETERIZATION  04/02/2014   DR Claiborne Billings  . ESOPHAGOGASTRODUODENOSCOPY (EGD) WITH PROPOFOL N/A 09/08/2018   Procedure: ESOPHAGOGASTRODUODENOSCOPY (EGD) WITH PROPOFOL;  Surgeon: Carol Ada, MD;  Location: [facility] ENDOSCOPY;  Service: Endoscopy;  Laterality: N/A;  . IMPLANTABLE CARDIOVERTER DEFIBRILLATOR IMPLANT N/A 09/10/2014   Procedure: IMPLANTABLE CARDIOVERTER DEFIBRILLATOR IMPLANT;  Surgeon: Sanda Klein, MD;  [facility] model [id number] serial number [id number]  . LEFT AND RIGHT HEART CATHETERIZATION WITH CORONARY ANGIOGRAM N/A 04/02/2014   Procedure: LEFT AND RIGHT HEART CATHETERIZATION WITH CORONARY ANGIOGRAM;  Surgeon: Troy Sine, MD;  Location: [facility] CATH LAB;  Service: Cardiovascular;  Laterality: N/A;  . MEDIAL COLLATERAL LIGAMENT REPAIR, KNEE Right 08/25/2015   Procedure: RIGHT THUMB RADICAL COLLATERAL LIGAMENT REPAIR;  Surgeon: Charlotte Crumb, MD;  Location: [place];  Service: Orthopedics;  Laterality: Right;  . PERCUTANEOUS CORONARY STENT INTERVENTION (PCI-S) N/A 04/03/2014   Procedure: PERCUTANEOUS CORONARY STENT INTERVENTION (PCI-S);  Surgeon: Peter M Martinique, MD;  Location: [facility] CATH LAB;  Service: Cardiovascular;  Laterality: N/A;  .  SAVORY DILATION N/A 09/08/2018   Procedure: SAVORY DILATION;  Surgeon: Carol Ada, MD;  Location: [facility] ENDOSCOPY;  Service: Endoscopy;  Laterality: N/A;       Family History  Problem Relation Age of Onset  . Heart disease Mother     Social History   Tobacco Use  . Smoking status: Never Smoker  . Smokeless tobacco: Never Used  Substance Use Topics  . Alcohol use: No  . Drug use: No    Home Medications Prior to Admission medications   Medication Sig Start Date End Date Taking? Authorizing Provider  meloxicam (MOBIC) 7.5 MG tablet Take 1 tablet (7.5 mg total) by mouth 2 (two) times daily as needed for up to 14 days for pain. 08/23/20 09/06/20 Yes Noemi Chapel, MD  abacavir-dolutegravir-lamiVUDine (TRIUMEQ) 600-50-300 MG tablet Take 1 tablet by mouth daily.    [provider]  aspirin 81 MG chewable tablet Chew 81 mg by mouth daily.    [provider]  atorvastatin (LIPITOR) 40 MG tablet Take 1 tablet (40 mg total) by mouth daily. 01/18/20   Croitoru, Mihai, MD  Azelaic Acid 15 % cream azelaic acid 15 % topical gel  APPLY A SMALL AMOUNT ON FACE AT NIGHT TIME TWICE A WEEK 09/20/19   [provider]  cetirizine (ZYRTEC) 10 MG tablet Take 10 mg by mouth daily.    [provider]  DULoxetine (CYMBALTA) 30 MG capsule Take 30 mg by mouth 2 (two) times daily.    [provider]  DULoxetine (CYMBALTA) 30 MG capsule duloxetine 30 mg capsule,delayed release  Take 1 capsule twice a day by oral route.    [provider]  fluticasone (FLONASE) 50 MCG/ACT nasal spray Place 2 sprays into both nostrils daily as needed for rhinitis.    [provider]  Lactobacillus (ACIDOPHILUS/PECTIN PO) Take 1 capsule by mouth daily.    [provider]  losartan (COZAAR) 50 MG tablet Take 50 mg by mouth daily.     [provider]  metoprolol succinate (TOPROL-XL) 100 MG 24 hr tablet Take 50 mg by mouth daily. Take with or immediately  following a meal.    [provider]  metoprolol succinate (TOPROL-XL) 25 MG 24 hr tablet metoprolol succinate ER 25 mg tablet,extended release 24 hr  TAKE 1 TABLET BY MOUTH ONCE DAILY FOR 90 DAYS    [provider]  Multiple Vitamins-Minerals (CENTRUM SILVER PO) Take 2 tablets by mouth daily.    [provider]  Omega-3 Fatty Acids (FISH OIL) 1000 MG CAPS Take by mouth.    [provider]  pantoprazole (PROTONIX) 40 MG tablet Take 1 tablet (40 mg total) by mouth daily as needed. Patient taking differently: Take 40 mg by mouth daily.  04/04/14   Almyra Deforest, PA  Polyvinyl Alcohol-Povidone PF (REFRESH) 1.4-0.6 % SOLN Place 1 drop into both eyes 6 (six) times daily.    [provider]  risperiDONE (RISPERDAL) 1 MG tablet Take 1.5 mg by mouth at bedtime.    [provider]  rosuvastatin (CRESTOR) 20 MG tablet Take 20 mg by mouth daily.    [provider]    Allergies    Lisinopril, Ioxaglate, and Ivp dye [iodinated diagnostic agents]  Review of Systems   Review  of Systems  All other systems reviewed and are negative.   Physical Exam Updated Vital Signs BP 125/89   Pulse 68   Temp 98.1 F (36.7 C) (Oral)   Resp 18   SpO2 95%   Physical Exam Vitals and nursing note reviewed.  Constitutional:      General: He is not in acute distress.    Appearance: He is well-developed and well-nourished.  HENT:     Head: Normocephalic and atraumatic.     Mouth/Throat:     Mouth: Oropharynx is clear and moist.     Pharynx: No oropharyngeal exudate.  Eyes:     General: No scleral icterus.       Right eye: No discharge.        Left eye: No discharge.     Extraocular Movements: EOM normal.     Conjunctiva/sclera: Conjunctivae normal.     Pupils: Pupils are equal, round, and reactive to light.  Neck:     Thyroid: No thyromegaly.     Vascular: No JVD.  Cardiovascular:     Rate and Rhythm: Normal rate and regular rhythm.     Pulses:  Intact distal pulses.     Heart sounds: Normal heart sounds. No murmur heard. No friction rub. No gallop.   Pulmonary:     Effort: Pulmonary effort is normal. No respiratory distress.     Breath sounds: Normal breath sounds. No wheezing or rales.  Abdominal:     General: Bowel sounds are normal. There is no distension.     Palpations: Abdomen is soft. There is no mass.     Tenderness: There is abdominal tenderness.     Comments: The patient has a diffusely soft abdomen without any guarding or peritoneal signs.  There is no pulsating mass, he has mild to moderate mid abdominal tenderness and minimal right lower quadrant tenderness, no left-sided tenderness.  Musculoskeletal:        General: No tenderness or edema. Normal range of motion.     Cervical back: Normal range of motion and neck supple.  Lymphadenopathy:     Cervical: No cervical adenopathy.  Skin:    General: Skin is warm and dry.     Findings: No erythema or rash.  Neurological:     Mental Status: He is alert.     Coordination: Coordination normal.  Psychiatric:        Mood and Affect: Mood and affect normal.        Behavior: Behavior normal.     ED Results / Procedures / Treatments   Labs (all labs ordered are listed, but only abnormal results are displayed) Labs Reviewed  COMPREHENSIVE METABOLIC PANEL - Abnormal; Notable for the following components:      Result Value   Glucose, Bld 100 (*)    Creatinine, Ser 1.47 (*)    GFR, Estimated 53 (*)    All other components within normal limits  CBC - Abnormal; Notable for the following components:   Platelets 135 (*)    All other components within normal limits  URINALYSIS, ROUTINE W REFLEX MICROSCOPIC - Abnormal; Notable for the following components:   Color, Urine STRAW (*)    Specific Gravity, Urine 1.004 (*)    All other components within normal limits  LIPASE, BLOOD    EKG None  Radiology CT ABDOMEN PELVIS WO CONTRAST  Result Date: 08/23/2020 CLINICAL  DATA:  Reports lower abd pain that radiates to back since this morning. Denies nausea, vomiting, and diarrhea.  Reports h/o kidney stone in distant past. EXAM: CT ABDOMEN AND PELVIS WITHOUT CONTRAST TECHNIQUE: Multidetector CT imaging of the abdomen and pelvis was performed following the standard protocol without IV contrast. COMPARISON:  None. FINDINGS: Lower chest: Moderate hiatal hernia.  Minimal bibasilar atelectasis. Hepatobiliary: There is a tiny hypodensity along the anterior left hepatic lobe (series 3, image 21) which is too small to fully characterize but likely represents a cyst. Pancreas: Unremarkable. No surrounding inflammatory changes. Spleen: Normal in size without focal abnormality. Adrenals/Urinary Tract: Adrenal glands are unremarkable. Kidneys are symmetric in size. No large renal mass. No hydronephrosis or renal calculi. Urinary bladder is unremarkable. Stomach/Bowel: Stomach is within normal limits. Appendix appears normal. No evidence of bowel wall thickening, distention, or inflammatory changes. There is a possible small mass versus diverticulum in the low central abdomen measuring 1.5 cm (series 3, image 66). Vascular/Lymphatic: Mild aortic atherosclerosis. Vascular patency cannot be assessed in the absence of IV contrast. No enlarged abdominal or pelvic lymph nodes. Reproductive: Prostate is unremarkable. Other: No abdominal wall hernia or abnormality. No abdominopelvic ascites. Musculoskeletal: No acute or significant osseous findings. IMPRESSION: 1. No acute intra-abdominal or intrapelvic pathology on a noncontrast exam. No renal calculi. 2. Moderate hiatal hernia. 3. Possible small mass versus diverticulum in the low central abdomen measuring 1.5 cm. Recommend further evaluation with nonemergent CT of the abdomen and pelvis with oral and IV contrast. 4. Aortic atherosclerosis. Aortic Atherosclerosis (ICD10-I70.0). Electronically Signed   By: Audie Pinto M.D.   On: 08/23/2020 18:44     Procedures Procedures (including critical care time)  Medications Ordered in ED Medications  alum & mag hydroxide-simeth (MAALOX/MYLANTA) 200-200-20 MG/5ML suspension 30 mL (has no administration in time range)    And  lidocaine (XYLOCAINE) 2 % viscous mouth solution 15 mL (has no administration in time range)    ED Course  I have reviewed the triage vital signs and the nursing notes.  Pertinent labs & imaging results that were available during my care of the patient were reviewed by me and considered in my medical decision making (see chart for details).  Clinical Course as of 08/23/20 1923  Sat Aug 23, 2020  1852 CBC unremarkable, metabolic panel unremarkable, urinalysis is being given at this time, CT scan shows no acute findings though there is a possible 1.5 cm mass that the patient will need to have followed up as an outpatient.  He has been informed of this and expresses understanding [BM]    Clinical Course User Index [BM] Noemi Chapel, MD   MDM Rules/Calculators/A&P                          I would consider it differential including acute appendicitis, abdominal aortic aneurysm, cholecystitis, pancreatitis.  I would also consider urinary tract pathology given his recent dysuria.  Lipase is 35 Metabolic panel with a creatinine of 1.47 which is baseline CBC without leukocytosis or anemia, chronic mild thrombocytopenia present  We will add a urinalysis and a CT scan, the patient is agreeable Pt declines pain medicine on the initial evaluation  UA clean, GI cocktail given Stable for d/c.  Final Clinical Impression(s) / ED Diagnoses Final diagnoses:  Lower abdominal pain  Abnormal finding on CT scan    Rx / DC Orders ED Discharge Orders         Ordered    meloxicam (MOBIC) 7.5 MG tablet  2 times daily PRN  08/23/20 1911           Noemi Chapel, MD 08/23/20 [phone]

## 2020-08-23 NOTE — ED Triage Notes (Signed)
Pt to triage via [facility] from home.  Reports lower abd pain that radiates to back since this morning.  Denies nausea, vomiting, and diarrhea.

## 2020-08-23 NOTE — Discharge Instructions (Signed)
Your CT scan was rather unremarkable and there was no signs of what is causing your pain today.  Your blood work was also very normal.   Your CT scan did however show that there was a very small area around the size of a thumbnail in the lower middle part of your abdomen which did not appear normal, it was unclear exactly what it is but it is recommended that you have your family doctor followed up either with an MRI or a different type of CT scan with contrast in the outpatient setting.   I have sent some pain medication to the pharmacy for you so that you have something to help control your pain as well as the nausea medication just in case you become nauseated   I would like you to come back to the emergency department if your symptoms worsen otherwise see your doctor within 24 to 48 hours for a recheck and share with them your results including the CT scan.

## 2020-09-03 DIAGNOSIS — D696 Thrombocytopenia, unspecified: Secondary | ICD-10-CM | POA: Diagnosis not present

## 2020-09-03 DIAGNOSIS — R1900 Intra-abdominal and pelvic swelling, mass and lump, unspecified site: Secondary | ICD-10-CM | POA: Diagnosis not present

## 2020-09-03 DIAGNOSIS — E785 Hyperlipidemia, unspecified: Secondary | ICD-10-CM | POA: Diagnosis not present

## 2020-09-03 DIAGNOSIS — R109 Unspecified abdominal pain: Secondary | ICD-10-CM | POA: Diagnosis not present

## 2020-09-03 DIAGNOSIS — I10 Essential (primary) hypertension: Secondary | ICD-10-CM | POA: Diagnosis not present

## 2020-09-03 DIAGNOSIS — N1831 Chronic kidney disease, stage 3a: Secondary | ICD-10-CM | POA: Diagnosis not present

## 2020-09-03 DIAGNOSIS — I700 Atherosclerosis of aorta: Secondary | ICD-10-CM | POA: Diagnosis not present

## 2020-09-03 DIAGNOSIS — K219 Gastro-esophageal reflux disease without esophagitis: Secondary | ICD-10-CM | POA: Diagnosis not present

## 2020-09-04 ENCOUNTER — Telehealth: Payer: Self-pay

## 2020-09-04 ENCOUNTER — Other Ambulatory Visit: Payer: Self-pay | Admitting: Internal Medicine

## 2020-09-04 DIAGNOSIS — R1900 Intra-abdominal and pelvic swelling, mass and lump, unspecified site: Secondary | ICD-10-CM

## 2020-09-04 NOTE — Telephone Encounter (Signed)
The patient's ICD is MRI conditional. If he gives [place] the date, time and location of the MRI scan, we can make arrangements to get the [employer] representative there to do the necessary reprogramming of the ICD before and after the scan.

## 2020-09-04 NOTE — Telephone Encounter (Signed)
Spoke with Dr. Maia Petties who states that patient needed to have a CT of the abdomen but due to contrast allergy and patients stage III CKD Dr. Maia Petties decided that a MRI of the abdomen would be better for patient.   Dr. Maia Petties wanted to check with Dr. Sallyanne Kuster and see what needed to be done about patients ICD during MRI to make it MRI compatible. Advised Dr. Maia Petties I would forward message to Dr. Sallyanne Kuster for review.

## 2020-09-04 NOTE — Telephone Encounter (Signed)
Spoke to Dr. Maia Petties and gave him the necessary info.

## 2020-09-04 NOTE — Telephone Encounter (Signed)
DR Maia Petties called back in , transferred call to [place]

## 2020-09-04 NOTE — Telephone Encounter (Signed)
Pts PCP office called in.  Dr Maia Petties would like to speak with Dr Sallyanne Kuster personally about this pt  Dr Maia Petties Direct number is [phone]

## 2020-09-04 NOTE — Telephone Encounter (Signed)
Attempted to call number that was left but unable to reach anyone, left message to call back to office.   Will forward to Dr. Sallyanne Kuster as well.

## 2020-09-05 ENCOUNTER — Other Ambulatory Visit (HOSPITAL_COMMUNITY): Payer: Self-pay | Admitting: Internal Medicine

## 2020-09-05 DIAGNOSIS — R1900 Intra-abdominal and pelvic swelling, mass and lump, unspecified site: Secondary | ICD-10-CM

## 2020-09-29 ENCOUNTER — Other Ambulatory Visit: Payer: Self-pay

## 2020-09-29 ENCOUNTER — Ambulatory Visit (HOSPITAL_COMMUNITY)
Admission: RE | Admit: 2020-09-29 | Discharge: 2020-09-29 | Disposition: A | Discharge status: Home / Self Care | Payer: Medicare Other | Source: Ambulatory Visit | Attending: Internal Medicine | Admitting: Internal Medicine

## 2020-09-29 ENCOUNTER — Ambulatory Visit (HOSPITAL_COMMUNITY): Admission: RE | Admit: 2020-09-29 | Payer: Medicare Other | Source: Ambulatory Visit

## 2020-09-29 DIAGNOSIS — K7689 Other specified diseases of liver: Secondary | ICD-10-CM | POA: Diagnosis not present

## 2020-09-29 DIAGNOSIS — I700 Atherosclerosis of aorta: Secondary | ICD-10-CM | POA: Diagnosis not present

## 2020-09-29 DIAGNOSIS — R1900 Intra-abdominal and pelvic swelling, mass and lump, unspecified site: Secondary | ICD-10-CM | POA: Diagnosis not present

## 2020-09-29 DIAGNOSIS — K449 Diaphragmatic hernia without obstruction or gangrene: Secondary | ICD-10-CM | POA: Diagnosis not present

## 2020-09-29 MED ORDER — GADOBUTROL 1 MMOL/ML IV SOLN
9.5000 mL | Freq: Once | INTRAVENOUS | Status: AC | PRN
  Administered 2020-09-29: 9.5 mL via INTRAVENOUS

## 2020-09-29 NOTE — Progress Notes (Signed)
Per order, changed pacing settings to OFF for MRI. (VS only, VP < 1%)   Tachy-therapies to off.   Will program device back to pre-MRI settings after completion of exam.

## 2020-09-29 NOTE — Progress Notes (Signed)
Informed of MRI for today.   Device system confirmed to be MRI conditional, with implant date > 6 weeks ago and no evidence of abandoned or epicardial leads in review of most recent CXR Interrogation from today reviewed, pt is currently VS a~70 bpm Change pacing settings to OFF for MRI. (VS only, VP < 1%)  Tachy-therapies to off.  Program device back to pre-MRI settings after completion of exam.  Annamaria Helling  09/29/2020 1:58 PM

## 2020-10-09 DIAGNOSIS — L811 Chloasma: Secondary | ICD-10-CM | POA: Diagnosis not present

## 2020-10-09 DIAGNOSIS — L819 Disorder of pigmentation, unspecified: Secondary | ICD-10-CM | POA: Diagnosis not present

## 2020-10-10 ENCOUNTER — Ambulatory Visit (INDEPENDENT_AMBULATORY_CARE_PROVIDER_SITE_OTHER): Payer: Medicare Other

## 2020-10-10 DIAGNOSIS — I255 Ischemic cardiomyopathy: Secondary | ICD-10-CM | POA: Diagnosis not present

## 2020-10-13 LAB — CUP PACEART REMOTE DEVICE CHECK
Battery Remaining Longevity: 50 mo
Battery Remaining Percentage: 46 %
Battery Voltage: 2.98 V
Brady Statistic RV Percent Paced: 1 %
Date Time Interrogation Session: 20220304064100
HighPow Impedance: 61 Ohm
HighPow Impedance: 61 Ohm
Implantable Lead Implant Date: 20160202
Implantable Lead Location: 753860
Implantable Pulse Generator Implant Date: 20160202
Lead Channel Impedance Value: 350 Ohm
Lead Channel Pacing Threshold Amplitude: 0.75 V
Lead Channel Pacing Threshold Pulse Width: 0.5 ms
Lead Channel Sensing Intrinsic Amplitude: 9.9 mV
Lead Channel Setting Pacing Amplitude: 2.5 V
Lead Channel Setting Pacing Pulse Width: 0.5 ms
Lead Channel Setting Sensing Sensitivity: 0.5 mV
Pulse Gen Serial Number: 7229147

## 2020-10-21 NOTE — Progress Notes (Signed)
Remote ICD transmission.   

## 2020-10-27 ENCOUNTER — Ambulatory Visit (INDEPENDENT_AMBULATORY_CARE_PROVIDER_SITE_OTHER): Payer: Medicare Other | Admitting: Cardiovascular Disease

## 2020-10-27 ENCOUNTER — Other Ambulatory Visit: Payer: Self-pay

## 2020-10-27 ENCOUNTER — Encounter: Payer: Self-pay | Admitting: Cardiovascular Disease

## 2020-10-27 VITALS — BP 100/74 | HR 67 | Ht 70.0 in | Wt 197.2 lb

## 2020-10-27 DIAGNOSIS — Z95810 Presence of automatic (implantable) cardiac defibrillator: Secondary | ICD-10-CM | POA: Diagnosis not present

## 2020-10-27 DIAGNOSIS — E785 Hyperlipidemia, unspecified: Secondary | ICD-10-CM | POA: Diagnosis not present

## 2020-10-27 DIAGNOSIS — I472 Ventricular tachycardia, unspecified: Secondary | ICD-10-CM

## 2020-10-27 DIAGNOSIS — I10 Essential (primary) hypertension: Secondary | ICD-10-CM

## 2020-10-27 DIAGNOSIS — I5042 Chronic combined systolic (congestive) and diastolic (congestive) heart failure: Secondary | ICD-10-CM | POA: Diagnosis not present

## 2020-10-27 DIAGNOSIS — I2510 Atherosclerotic heart disease of native coronary artery without angina pectoris: Secondary | ICD-10-CM

## 2020-10-27 DIAGNOSIS — B20 Human immunodeficiency virus [HIV] disease: Secondary | ICD-10-CM

## 2020-10-27 MED ORDER — EZETIMIBE 10 MG PO TABS: 10.0000 mg | ORAL_TABLET | Freq: Every day | ORAL | 3 refills | Status: DC

## 2020-10-27 NOTE — Patient Instructions (Signed)
Medication Instructions:  START Ezetimibe 10 mg once daily  *If you need a refill on your cardiac medications before your next appointment, please call your pharmacy*   Lab Work: Your provider would like for you to return in 3-5 months to have the following labs drawn: fasting Lipid. You do not need an appointment for the lab. Once in our office lobby there is a podium where you can sign in and ring the doorbell to alert [place] that you are here. The lab is open from 8:00 am to 4:30 pm; closed for lunch from 12:45pm-1:45pm.  If you have labs (blood work) drawn today and your tests are completely normal, you will receive your results only by: [name] MyChart Message (if you have MyChart) OR . A paper copy in the mail If you have any lab test that is abnormal or we need to change your treatment, we will call you to review the results.   Testing/Procedures: None ordered   Follow-Up: At [facility], you and your health needs are our priority.  As part of our continuing mission to provide you with exceptional heart care, we have created designated Provider Care Teams.  These Care Teams include your primary Cardiologist (physician) and Advanced Practice Providers (APPs -  Physician Assistants and Nurse Practitioners) who all work together to provide you with the care you need, when you need it.  We recommend signing up for the patient portal called "MyChart".  Sign up information is provided on this After Visit Summary.  MyChart is used to connect with patients for Virtual Visits (Telemedicine).  Patients are able to view lab/test results, encounter notes, upcoming appointments, etc.  Non-urgent messages can be sent to your provider as well.   To learn more about what you can do with MyChart, go to [email or web address].    Your next appointment:   Follow up with Dr. Claiborne Billings in 6 months Follow up with Dr. Sallyanne Kuster in 12 months

## 2020-10-27 NOTE — Progress Notes (Signed)
Cardiology office note    Date:  10/27/2020   ID:  Richard [name], DOB 1955-11-14, MRN [id number]  PCP:  Harvest [place], MD  Cardiologist:  Nicki Guadalajara, MD ; Mayana Irigoyen (device) Electrophysiologist:  None   Evaluation Performed:  Follow-Up Visit  Chief Complaint:  ICD follow-up  History of Present Illness:    Richard [name] is a 65 y.o. male with with severe ischemic cardiomyopathy following an extensive LAD artery distribution myocardial infarction, well compensated chronic systolic and diastolic heart failure, roughly 5 years following implantation of a single-chamber St. Jude defibrillator for primary prevention.  He is doing well.  He walks on his treadmill on a regular basis without limitations.  Stopped taking rosuvastatin due to muscle aches, doing better on atorvastatin.  His most recent LDL cholesterol was 105 June 2021.  The patient specifically denies any chest pain at rest exertion, dyspnea at rest or with exertion, orthopnea, paroxysmal nocturnal dyspnea, syncope, palpitations, focal neurological deficits, intermittent claudication, lower extremity edema, unexplained weight gain, cough, hemoptysis or wheezing. He has not had any defibrillator discharges.  His most recent nuclear stress test in 2015 showed extensive scar without ischemia and the left ventricular ejection fraction of 27%.His most recent echocardiogram in May 2019 showed an EF of 25-30% with grade 2 diastolic dysfunction.  He received an appropriate shock for ventricular fibrillation on September 30, 2016, after he been out of his metoprolol for about a week.  He has not had any ventricular events since then.  Comprehensive device check today shows normal function.  Estimated ejection longevity is 4 years.  He has not had any episodes of high ventricular rate and does not require ventricular pacing.  Lead parameters are excellent.  Capture threshold was actually checked today.  His last visit with  Dr. Tresa Endo was in May 2020.  Lipid profile usually checked at the [facility].  He is also under treatment for HIV there and has undetectable viral load.  He is a disabled Investment banker, operational.  Past Medical History:  Diagnosis Date  . Coronary artery disease    a. cath 04/02/2014 3v dx 80-90% in D1, 80-90% mid LAD, 95% apical LAD, 95% distal inferior LV branch of LCx, total occlusion of prox RCA  b). Cath 04/03/2014 DES to mid LAD, DES x3 to prox D1, DAPT indefinitely. LCx lesion untreated  . HIV (human immunodeficiency virus infection) (HCC)   . Hypertension   . Ischemic cardiomyopathy 2015   EF 20-25 percent by echo in 04/2014, s/p St. Jude Fortify Assura model [id number] serial number [id number]  . Stroke [facility])    Past Surgical History:  Procedure Laterality Date  . CARDIAC CATHETERIZATION  04/02/2014   DR Tresa Endo  . ESOPHAGOGASTRODUODENOSCOPY (EGD) WITH PROPOFOL N/A 09/08/2018   Procedure: ESOPHAGOGASTRODUODENOSCOPY (EGD) WITH PROPOFOL;  Surgeon: Jeani Hawking, MD;  Location: [facility] ENDOSCOPY;  Service: Endoscopy;  Laterality: N/A;  . IMPLANTABLE CARDIOVERTER DEFIBRILLATOR IMPLANT N/A 09/10/2014   Procedure: IMPLANTABLE CARDIOVERTER DEFIBRILLATOR IMPLANT;  Surgeon: Thurmon Fair, MD;  [facility] model [id number] serial number [id number]  . LEFT AND RIGHT HEART CATHETERIZATION WITH CORONARY ANGIOGRAM N/A 04/02/2014   Procedure: LEFT AND RIGHT HEART CATHETERIZATION WITH CORONARY ANGIOGRAM;  Surgeon: Lennette Bihari, MD;  Location: [facility] CATH LAB;  Service: Cardiovascular;  Laterality: N/A;  . MEDIAL COLLATERAL LIGAMENT REPAIR, KNEE Right 08/25/2015   Procedure: RIGHT THUMB RADICAL COLLATERAL LIGAMENT REPAIR;  Surgeon: Dairl Ponder, MD;  Location: [facility] OR;  Service: Orthopedics;  Laterality: Right;  .  PERCUTANEOUS CORONARY STENT INTERVENTION (PCI-S) N/A 04/03/2014   Procedure: PERCUTANEOUS CORONARY STENT INTERVENTION (PCI-S);  Surgeon: Peter M Swaziland, MD;  Location: [facility] CATH LAB;  Service: Cardiovascular;   Laterality: N/A;  . SAVORY DILATION N/A 09/08/2018   Procedure: SAVORY DILATION;  Surgeon: Jeani Hawking, MD;  Location: [facility] ENDOSCOPY;  Service: Endoscopy;  Laterality: N/A;     Current Meds  Medication Sig  . abacavir-dolutegravir-lamiVUDine (TRIUMEQ) 600-50-300 MG tablet Take 1 tablet by mouth daily.  [name] aspirin 81 MG chewable tablet Chew 81 mg by mouth daily.  [name] atorvastatin (LIPITOR) 40 MG tablet Take 1 tablet (40 mg total) by mouth daily.  . Azelaic Acid 15 % cream azelaic acid 15 % topical gel  APPLY A SMALL AMOUNT ON FACE AT NIGHT TIME TWICE A WEEK  . cetirizine (ZYRTEC) 10 MG tablet Take 10 mg by mouth daily.  . DULoxetine (CYMBALTA) 30 MG capsule Take 30 mg by mouth 2 (two) times daily.  [name] ezetimibe (ZETIA) 10 MG tablet Take 1 tablet (10 mg total) by mouth daily.  . fluticasone (FLONASE) 50 MCG/ACT nasal spray Place 2 sprays into both nostrils daily as needed for rhinitis.  . Lactobacillus (ACIDOPHILUS/PECTIN PO) Take 1 capsule by mouth daily.  [name] losartan (COZAAR) 50 MG tablet Take 50 mg by mouth daily.   . metoprolol succinate (TOPROL-XL) 25 MG 24 hr tablet metoprolol succinate ER 25 mg tablet,extended release 24 hr  TAKE 1 TABLET BY MOUTH ONCE DAILY FOR 90 DAYS  . Multiple Vitamins-Minerals (CENTRUM SILVER PO) Take 2 tablets by mouth daily.  . Omega-3 Fatty Acids (FISH OIL) 1000 MG CAPS Take by mouth.  . pantoprazole (PROTONIX) 40 MG tablet Take 1 tablet (40 mg total) by mouth daily as needed. (Patient taking differently: Take 40 mg by mouth daily.)  . Polyvinyl Alcohol-Povidone PF 1.4-0.6 % SOLN Place 1 drop into both eyes 6 (six) times daily.  . risperiDONE (RISPERDAL) 1 MG tablet Take 1.5 mg by mouth at bedtime.  . [DISCONTINUED] DULoxetine (CYMBALTA) 30 MG capsule duloxetine 30 mg capsule,delayed release  Take 1 capsule twice a day by oral route.  . [DISCONTINUED] metoprolol succinate (TOPROL-XL) 100 MG 24 hr tablet Take 50 mg by mouth daily. Take with or immediately following  a meal.     Allergies:   Lisinopril, Ioxaglate, and Ivp dye [iodinated diagnostic agents]   Social History   Tobacco Use  . Smoking status: Never Smoker  . Smokeless tobacco: Never Used  Substance Use Topics  . Alcohol use: No  . Drug use: No     Family Hx: The patient's family history includes Heart disease in his mother.  ROS:   Please see the history of present illness.    All other systems are reviewed and are negative.   Prior CV studies:   The following studies were reviewed today: Comprehensive device check in the office today.  Labs/Other Tests and Data Reviewed:    EKG: ECG ordered today shows sinus rhythm with occasional PVCs/fusion beats, possible left atrial abnormality, right superior axis deviation and poor R wave progression across anterior precordium, inverted T waves in leads V4-V6.  QTc 449 ms  Recent Labs: 08/23/2020: ALT 28; BUN 11; Creatinine, Ser 1.47; Hemoglobin 13.7; Platelets 135; Potassium 3.7; Sodium 137   Recent Lipid Panel Lab Results  Component Value Date/Time   CHOL 157 01/17/2020 09:05 AM   TRIG 81 01/17/2020 09:05 AM   HDL 36 (L) 01/17/2020 09:05 AM   CHOLHDL 4.4 01/17/2020 09:05 AM  CHOLHDL 3.9 09/05/2015 08:48 AM   LDLCALC 105 (H) 01/17/2020 09:05 AM    Wt Readings from Last 3 Encounters:  10/27/20 197 lb 3.2 oz (89.4 kg)  01/16/20 202 lb (91.6 kg)  12/13/18 190 lb (86.2 kg)     Objective:    Vital Signs:  BP 92/80   Pulse 67   Ht 5\' 10"  (1.778 m)   Wt 197 lb 3.2 oz (89.4 kg)   SpO2 98%   BMI 28.30 kg/m     General: Alert, oriented x3, no distress, appears healthy and fit, although overweight Head: no evidence of trauma, PERRL, EOMI, no exophtalmos or lid lag, no myxedema, no xanthelasma; normal ears, nose and oropharynx Neck: normal jugular venous pulsations and no hepatojugular reflux; brisk carotid pulses without delay and no carotid bruits Chest: clear to auscultation, no signs of consolidation by percussion or  palpation, normal fremitus, symmetrical and full respiratory excursions Cardiovascular: normal position and quality of the apical impulse, regular rhythm, normal first and second heart sounds, no murmurs, rubs or gallops Abdomen: no tenderness or distention, no masses by palpation, no abnormal pulsatility or arterial bruits, normal bowel sounds, no hepatosplenomegaly Extremities: no clubbing, cyanosis or edema; 2+ radial, ulnar and brachial pulses bilaterally; 2+ right femoral, posterior tibial and dorsalis pedis pulses; 2+ left femoral, posterior tibial and dorsalis pedis pulses; no subclavian or femoral bruits Neurological: grossly nonfocal Psych: Normal mood and affect   ASSESSMENT & PLAN:    1. CHF: NYHA functional class I and euvolemic without daily diuretics.  Unable to tolerate Entresto or higher doses of beta-blocker due to low blood pressure 2. CAD: No angina, asymptomatic on beta-blockers. 3. VT: Had an appropriate defibrillator shock in the setting of beta-blocker withdrawal in early 2018.  Reminded him of the risk of beta-blocker rebound abrupt and throat. 4. ICD: Normal in person check today.  Normal lead parameters including capture threshold.  Thoracic impedance has not correlated well with any episodes of volume overload in this particular patient. 5. HLP: On atorvastatin with target LDL less than 70.  Had muscle aches with rosuvastatin.  Add Zetia 10 mg daily and recheck lipid profile later this year. 6. HTN: Blood pressure is low and will not tolerate additional titration of heart failure meds. 7. HIV: Reports that his viral load has been undetectable at the [place].   COVID-19 Education: The signs and symptoms of COVID-19 were discussed with the patient and how to seek care for testing (follow up with PCP or arrange E-visit).  The importance of social distancing was discussed today.  Time:   Today, I have spent 21 minutes with the patient with telehealth technology discussing the  above problems.     Medication Adjustments/Labs and Tests Ordered: Current medicines are reviewed at length with the patient today.  Concerns regarding medicines are outlined above.   Tests Ordered: Orders Placed This Encounter  Procedures  . Lipid panel  . EKG 12-Lead    Medication Changes: Meds ordered this encounter  Medications  . ezetimibe (ZETIA) 10 MG tablet    Sig: Take 1 tablet (10 mg total) by mouth daily.    Dispense:  90 tablet    Refill:  3    Disposition:  Follow up Remote downloads every 3 months and office visit in a year.  Signed, Thurmon Fair, MD  10/27/2020 8:54 AM    [facility]

## 2020-11-27 DIAGNOSIS — Z0001 Encounter for general adult medical examination with abnormal findings: Secondary | ICD-10-CM | POA: Diagnosis not present

## 2020-11-27 DIAGNOSIS — N1831 Chronic kidney disease, stage 3a: Secondary | ICD-10-CM | POA: Diagnosis not present

## 2020-11-27 DIAGNOSIS — I10 Essential (primary) hypertension: Secondary | ICD-10-CM | POA: Diagnosis not present

## 2020-11-27 DIAGNOSIS — E785 Hyperlipidemia, unspecified: Secondary | ICD-10-CM | POA: Diagnosis not present

## 2020-12-02 DIAGNOSIS — R7303 Prediabetes: Secondary | ICD-10-CM | POA: Diagnosis not present

## 2020-12-02 DIAGNOSIS — I10 Essential (primary) hypertension: Secondary | ICD-10-CM | POA: Diagnosis not present

## 2020-12-02 DIAGNOSIS — E785 Hyperlipidemia, unspecified: Secondary | ICD-10-CM | POA: Diagnosis not present

## 2020-12-02 DIAGNOSIS — K219 Gastro-esophageal reflux disease without esophagitis: Secondary | ICD-10-CM | POA: Diagnosis not present

## 2020-12-02 DIAGNOSIS — R1900 Intra-abdominal and pelvic swelling, mass and lump, unspecified site: Secondary | ICD-10-CM | POA: Diagnosis not present

## 2020-12-02 DIAGNOSIS — I5022 Chronic systolic (congestive) heart failure: Secondary | ICD-10-CM | POA: Diagnosis not present

## 2020-12-02 DIAGNOSIS — N1831 Chronic kidney disease, stage 3a: Secondary | ICD-10-CM | POA: Diagnosis not present

## 2020-12-03 ENCOUNTER — Other Ambulatory Visit (HOSPITAL_COMMUNITY): Payer: Self-pay | Admitting: Internal Medicine

## 2020-12-03 ENCOUNTER — Other Ambulatory Visit: Payer: Self-pay | Admitting: Internal Medicine

## 2020-12-03 DIAGNOSIS — R1900 Intra-abdominal and pelvic swelling, mass and lump, unspecified site: Secondary | ICD-10-CM

## 2020-12-19 DIAGNOSIS — E1165 Type 2 diabetes mellitus with hyperglycemia: Secondary | ICD-10-CM | POA: Diagnosis not present

## 2021-01-09 ENCOUNTER — Ambulatory Visit (INDEPENDENT_AMBULATORY_CARE_PROVIDER_SITE_OTHER): Payer: Medicare Other

## 2021-01-09 DIAGNOSIS — I255 Ischemic cardiomyopathy: Secondary | ICD-10-CM | POA: Diagnosis not present

## 2021-01-09 LAB — CUP PACEART REMOTE DEVICE CHECK
Battery Remaining Longevity: 48 mo
Battery Remaining Percentage: 44 %
Battery Voltage: 2.96 V
Brady Statistic RV Percent Paced: 1 %
Date Time Interrogation Session: 20220603041351
HighPow Impedance: 64 Ohm
HighPow Impedance: 64 Ohm
Implantable Lead Implant Date: 20160202
Implantable Lead Location: 753860
Implantable Pulse Generator Implant Date: 20160202
Lead Channel Impedance Value: 340 Ohm
Lead Channel Pacing Threshold Amplitude: 1 V
Lead Channel Pacing Threshold Pulse Width: 0.5 ms
Lead Channel Sensing Intrinsic Amplitude: 9.2 mV
Lead Channel Setting Pacing Amplitude: 2.5 V
Lead Channel Setting Pacing Pulse Width: 0.5 ms
Lead Channel Setting Sensing Sensitivity: 0.5 mV
Pulse Gen Serial Number: 7229147

## 2021-01-27 NOTE — Progress Notes (Signed)
Remote ICD transmission.   

## 2021-01-28 DIAGNOSIS — Z23 Encounter for immunization: Secondary | ICD-10-CM | POA: Diagnosis not present

## 2021-01-28 DIAGNOSIS — K219 Gastro-esophageal reflux disease without esophagitis: Secondary | ICD-10-CM | POA: Diagnosis not present

## 2021-01-28 DIAGNOSIS — I5022 Chronic systolic (congestive) heart failure: Secondary | ICD-10-CM | POA: Diagnosis not present

## 2021-01-28 DIAGNOSIS — D696 Thrombocytopenia, unspecified: Secondary | ICD-10-CM | POA: Diagnosis not present

## 2021-01-28 DIAGNOSIS — E785 Hyperlipidemia, unspecified: Secondary | ICD-10-CM | POA: Diagnosis not present

## 2021-01-28 DIAGNOSIS — N1831 Chronic kidney disease, stage 3a: Secondary | ICD-10-CM | POA: Diagnosis not present

## 2021-01-28 DIAGNOSIS — Z1211 Encounter for screening for malignant neoplasm of colon: Secondary | ICD-10-CM | POA: Diagnosis not present

## 2021-01-28 DIAGNOSIS — I739 Peripheral vascular disease, unspecified: Secondary | ICD-10-CM | POA: Diagnosis not present

## 2021-01-28 DIAGNOSIS — R7303 Prediabetes: Secondary | ICD-10-CM | POA: Diagnosis not present

## 2021-01-28 DIAGNOSIS — Z0001 Encounter for general adult medical examination with abnormal findings: Secondary | ICD-10-CM | POA: Diagnosis not present

## 2021-02-04 ENCOUNTER — Other Ambulatory Visit: Payer: Self-pay | Admitting: Cardiovascular Disease

## 2021-02-04 DIAGNOSIS — I739 Peripheral vascular disease, unspecified: Secondary | ICD-10-CM | POA: Insufficient documentation

## 2021-02-05 ENCOUNTER — Ambulatory Visit (HOSPITAL_COMMUNITY): Payer: Medicare Other

## 2021-02-05 ENCOUNTER — Other Ambulatory Visit: Payer: Self-pay

## 2021-02-05 ENCOUNTER — Ambulatory Visit (HOSPITAL_COMMUNITY)
Admission: RE | Admit: 2021-02-05 | Discharge: 2021-02-05 | Disposition: A | Discharge status: Home / Self Care | Payer: Medicare Other | Source: Ambulatory Visit | Attending: Internal Medicine | Admitting: Internal Medicine

## 2021-02-05 DIAGNOSIS — K449 Diaphragmatic hernia without obstruction or gangrene: Secondary | ICD-10-CM | POA: Diagnosis not present

## 2021-02-05 DIAGNOSIS — R1900 Intra-abdominal and pelvic swelling, mass and lump, unspecified site: Secondary | ICD-10-CM

## 2021-02-05 DIAGNOSIS — R1904 Left lower quadrant abdominal swelling, mass and lump: Secondary | ICD-10-CM | POA: Diagnosis not present

## 2021-02-05 MED ORDER — GADOBUTROL 1 MMOL/ML IV SOLN
10.0000 mL | Freq: Once | INTRAVENOUS | Status: AC | PRN
  Administered 2021-02-05: 15:00:00 10 mL via INTRAVENOUS

## 2021-02-05 NOTE — Progress Notes (Signed)
Informed of MRI for today.   Device system confirmed to be MRI conditional, with implant date > 6 weeks ago, and no evidence of abandoned or epicardial leads in review of most recent CXR Interrogation from today reviewed, pt is currently VS at ~60 bpm Change device settings for MRI to MRI mode with pacing/tachy therapies off.   Tachy-therapies to off if applicable.  Program device back to pre-MRI settings after completion of exam.  Annamaria Helling  02/05/2021 12:45 PM

## 2021-02-05 NOTE — Progress Notes (Signed)
Per order, Changed device settings for MRI to MRI mode with pacing/tachy therapies off.   Tachy-therapies to off if applicable.   Will program device back to pre-MRI settings after completion of exam.

## 2021-02-10 ENCOUNTER — Other Ambulatory Visit: Payer: Self-pay | Admitting: Internal Medicine

## 2021-03-14 NOTE — Addendum Note (Signed)
Encounter addended by: Annie Paras on: 03/14/2021 2:25 PM  Actions taken: Letter saved

## 2021-04-01 NOTE — Addendum Note (Signed)
Encounter addended by: Annie Paras on: 04/01/2021 9:32 AM  Actions taken: Letter saved

## 2021-04-10 ENCOUNTER — Ambulatory Visit (INDEPENDENT_AMBULATORY_CARE_PROVIDER_SITE_OTHER): Payer: Medicare Other

## 2021-04-10 DIAGNOSIS — I255 Ischemic cardiomyopathy: Secondary | ICD-10-CM | POA: Diagnosis not present

## 2021-04-14 LAB — CUP PACEART REMOTE DEVICE CHECK
Battery Remaining Longevity: 44 mo
Battery Remaining Percentage: 41 %
Battery Voltage: 2.96 V
Brady Statistic RV Percent Paced: 1 %
Date Time Interrogation Session: 20220902020017
HighPow Impedance: 57 Ohm
HighPow Impedance: 57 Ohm
Implantable Lead Implant Date: 20160202
Implantable Lead Location: 753860
Implantable Pulse Generator Implant Date: 20160202
Lead Channel Impedance Value: 340 Ohm
Lead Channel Pacing Threshold Amplitude: 1 V
Lead Channel Pacing Threshold Pulse Width: 0.5 ms
Lead Channel Sensing Intrinsic Amplitude: 9.3 mV
Lead Channel Setting Pacing Amplitude: 2.5 V
Lead Channel Setting Pacing Pulse Width: 0.5 ms
Lead Channel Setting Sensing Sensitivity: 0.5 mV
Pulse Gen Serial Number: 7229147

## 2021-04-16 DIAGNOSIS — B078 Other viral warts: Secondary | ICD-10-CM | POA: Diagnosis not present

## 2021-04-16 DIAGNOSIS — L819 Disorder of pigmentation, unspecified: Secondary | ICD-10-CM | POA: Diagnosis not present

## 2021-04-16 DIAGNOSIS — T50905A Adverse effect of unspecified drugs, medicaments and biological substances, initial encounter: Secondary | ICD-10-CM | POA: Diagnosis not present

## 2021-04-16 DIAGNOSIS — L608 Other nail disorders: Secondary | ICD-10-CM | POA: Diagnosis not present

## 2021-04-21 NOTE — Progress Notes (Signed)
Remote ICD transmission.   

## 2021-04-24 DIAGNOSIS — N182 Chronic kidney disease, stage 2 (mild): Secondary | ICD-10-CM | POA: Diagnosis not present

## 2021-04-24 DIAGNOSIS — D696 Thrombocytopenia, unspecified: Secondary | ICD-10-CM | POA: Diagnosis not present

## 2021-04-24 DIAGNOSIS — E785 Hyperlipidemia, unspecified: Secondary | ICD-10-CM | POA: Diagnosis not present

## 2021-04-24 DIAGNOSIS — I10 Essential (primary) hypertension: Secondary | ICD-10-CM | POA: Diagnosis not present

## 2021-04-24 DIAGNOSIS — R7303 Prediabetes: Secondary | ICD-10-CM | POA: Diagnosis not present

## 2021-04-29 DIAGNOSIS — D696 Thrombocytopenia, unspecified: Secondary | ICD-10-CM | POA: Diagnosis not present

## 2021-04-29 DIAGNOSIS — I700 Atherosclerosis of aorta: Secondary | ICD-10-CM | POA: Diagnosis not present

## 2021-04-29 DIAGNOSIS — Z23 Encounter for immunization: Secondary | ICD-10-CM | POA: Diagnosis not present

## 2021-04-29 DIAGNOSIS — I5022 Chronic systolic (congestive) heart failure: Secondary | ICD-10-CM | POA: Diagnosis not present

## 2021-04-29 DIAGNOSIS — K219 Gastro-esophageal reflux disease without esophagitis: Secondary | ICD-10-CM | POA: Diagnosis not present

## 2021-04-29 DIAGNOSIS — L819 Disorder of pigmentation, unspecified: Secondary | ICD-10-CM | POA: Diagnosis not present

## 2021-04-29 DIAGNOSIS — I10 Essential (primary) hypertension: Secondary | ICD-10-CM | POA: Diagnosis not present

## 2021-04-29 DIAGNOSIS — R1900 Intra-abdominal and pelvic swelling, mass and lump, unspecified site: Secondary | ICD-10-CM | POA: Diagnosis not present

## 2021-04-30 IMAGING — MR MR ABDOMEN WO/W CM
20 of 23 series · 42 of 48 positions shown · IV contrast (gadavist)
Comparison: CT AP 08/23/2020

CLINICAL DATA: Evaluate pelvic nodule identified on recent CT.

EXAM:
MRI ABDOMEN AND PELVIS WITHOUT AND WITH CONTRAST
TECHNIQUE: Multiplanar multisequence MR imaging of the abdomen and pelvis was
performed both before and after the administration of intravenous
contrast.
CONTRAST:  9.5mL GADAVIST GADOBUTROL 1 MMOL/ML IV SOLN

[Series 5: cor haste plvs · coronal · 5.0mm · 1.25mm/px · 1 of 35 slices shown]
[im 1/35]
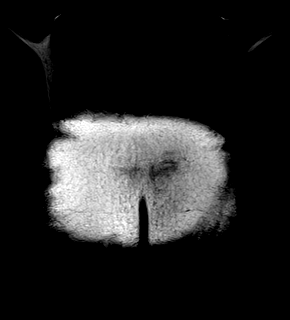

[Series 6: T2 · axial · 5.0mm · 0.55mm/px · 1 of 42 slices shown]
[im 1/42]
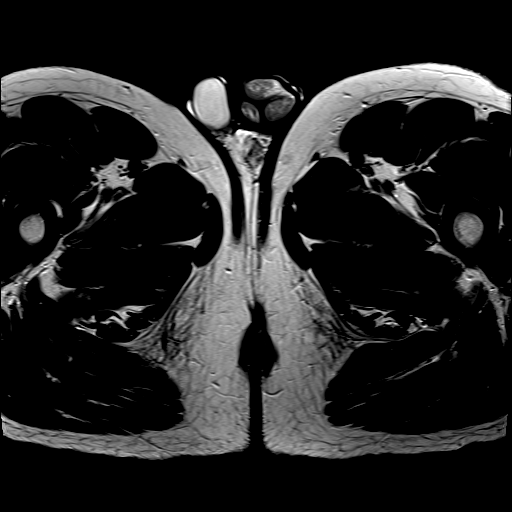

[Series 7: sag tse plvs · sagittal · 5.0mm · 0.49mm/px · 1 of 49 slices shown]
[im 1/49]
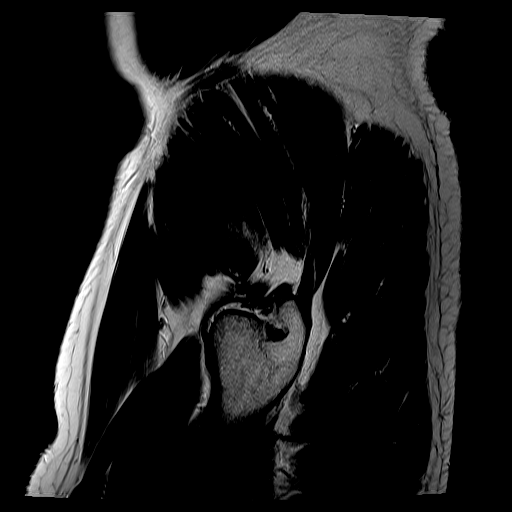

[Series 8: T1 · axial · 5.0mm · 1.09mm/px · 1 of 42 slices shown]
[im 1/42]
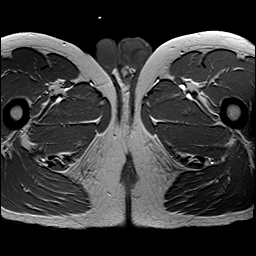

[Series 9: T1 fat-sat · axial · 5.0mm · 1.09mm/px · 1 of 42 slices shown]
[im 1/42]
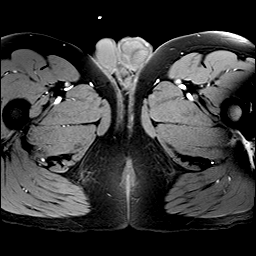

[Series 10: cor haste abd · coronal · 5.0mm · 1.25mm/px · 1 of 42 slices shown]
[im 1/42]
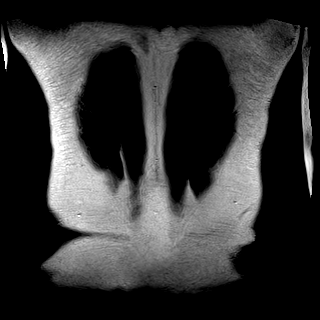

[Series 11: bSSFP · axial · 3.0mm · 0.74mm/px · z∈[+48,+303]mm · 3 of 86 slices shown (1 of 2)]
[im 1/86]
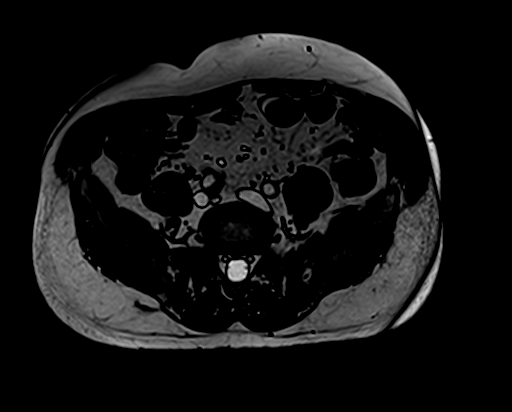
[im 43/86]
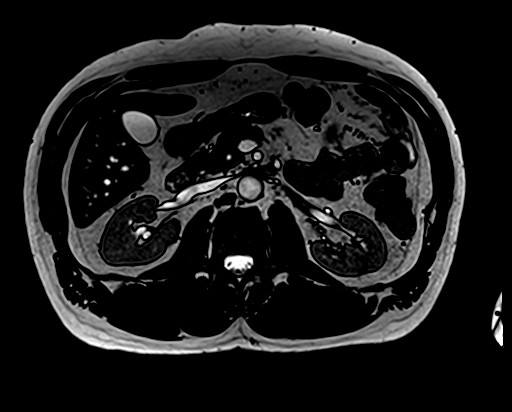
[im 86/86]
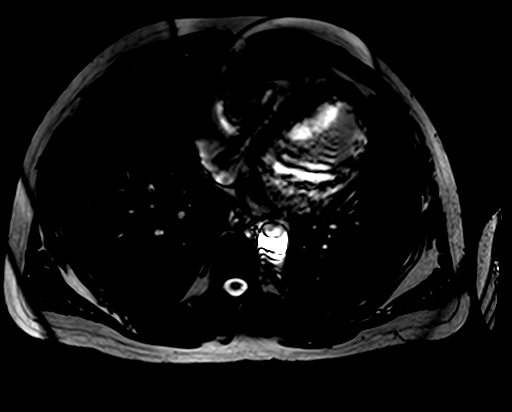

[Series 12: ax in and · axial · 3.0mm · 1.19mm/px · z∈[+45,+306]mm · 3 of 88 slices shown (1 of 2)]
[im 1/88]
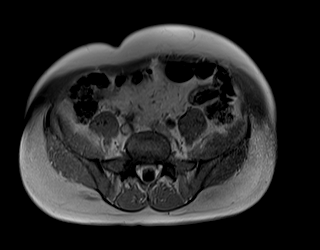
[im 44/88]
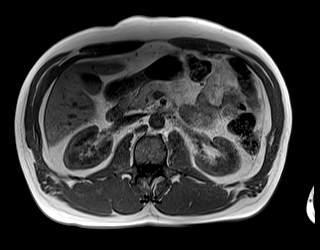
[im 88/88]
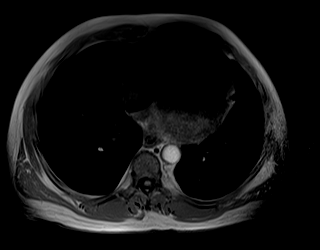

[Series 12: ax in and · axial · 3.0mm · 1.19mm/px · z∈[+45,+306]mm · 3 of 88 slices shown (2 of 2)]
[im 1/88]
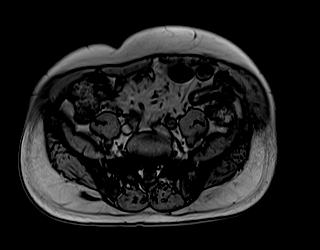
[im 44/88]
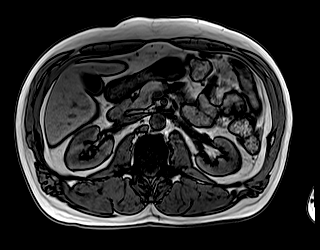
[im 88/88]
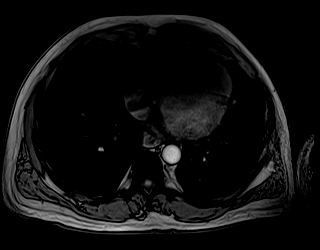

[Series 13: DWI · axial · 6.0mm · 1.42mm/px · z∈[+36,+316]mm · 2 of 80 slices shown (1 of 2)]
[im 1/80]
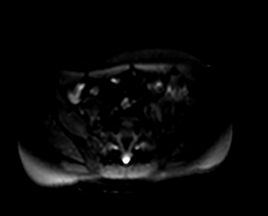
[im 80/80]
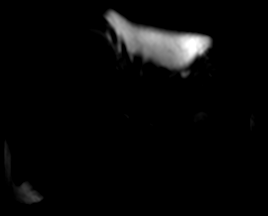

[Series 14: DWI · axial · 6.0mm · 1.42mm/px · 1 of 40 slices shown (2 of 2)]
[im 1/40]
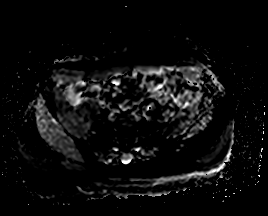

[Series 18: T2 fat-sat · axial · 6.0mm · 1.19mm/px · 1 of 40 slices shown]
[im 1/40]
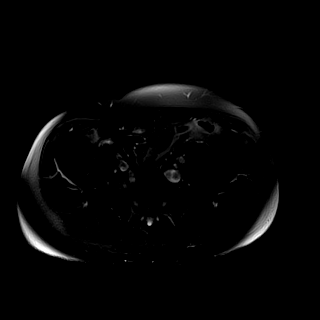

[Series 19: bSSFP · axial · 3.0mm · 0.74mm/px · z∈[-199,+56]mm · 3 of 86 slices shown (2 of 2)]
[im 1/86]
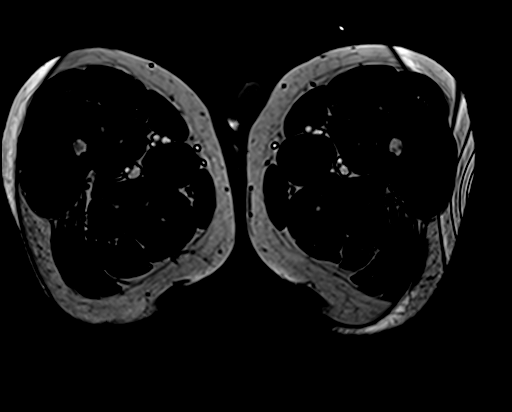
[im 43/86]
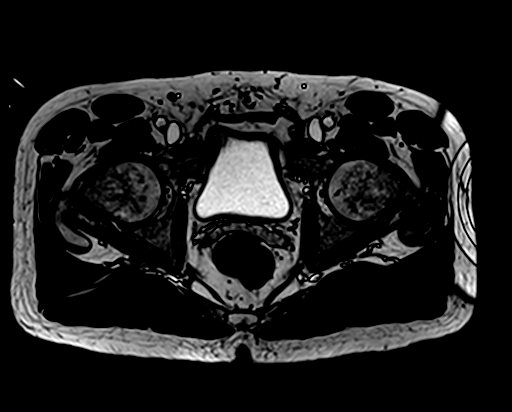
[im 86/86]
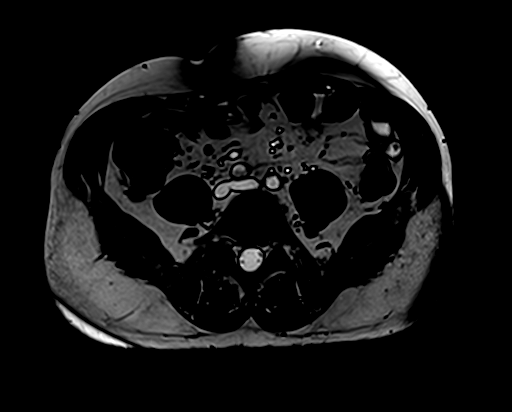

[Series 20: t1_vibe_fs_tra_p4_bh_pre abd · axial · 3.0mm · 1.19mm/px · z∈[+35,+296]mm · 3 of 88 slices shown]
[im 1/88]
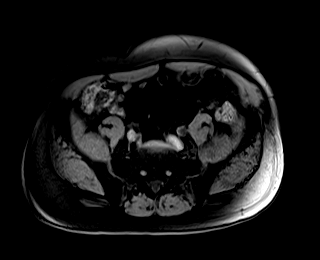
[im 44/88]
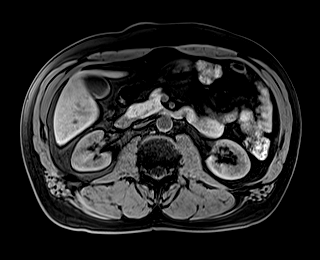
[im 88/88]
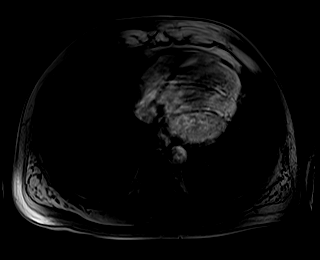

[Series 22: t1_vibe_fs_tra_p4_bh_post abd · axial · 3.0mm · 1.19mm/px · z∈[+35,+296]mm · 3 of 88 slices shown (1 of 3)]
[im 1/88]
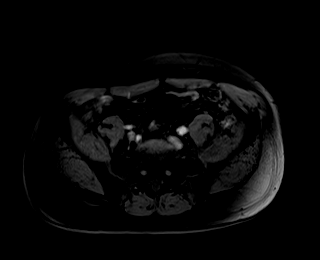
[im 44/88]
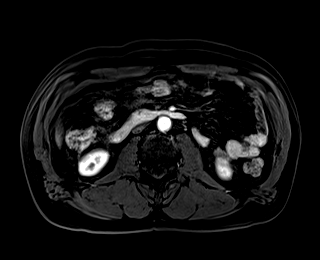
[im 88/88]
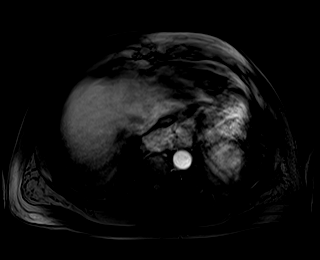

[Series 23: t1_vibe_fs_tra_p4_bh_post abd_sub · axial · 3.0mm · 1.19mm/px · z∈[+35,+296]mm · 3 of 88 slices shown (1 of 3)]
[im 1/88]
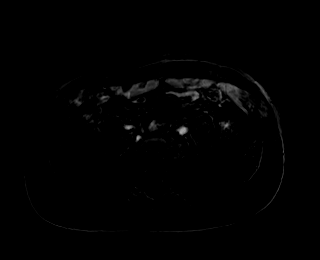
[im 44/88]
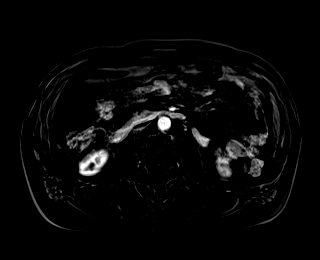
[im 88/88]
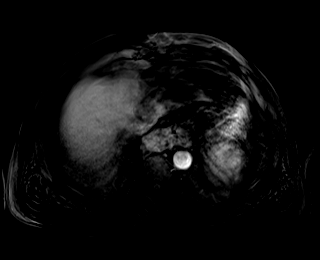

[Series 24: t1_vibe_fs_tra_p4_bh_post abd · axial · 3.0mm · 1.19mm/px · z∈[+35,+296]mm · 3 of 88 slices shown (2 of 3)]
[im 1/88]
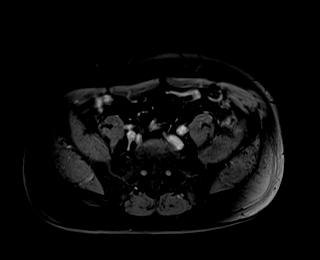
[im 44/88]
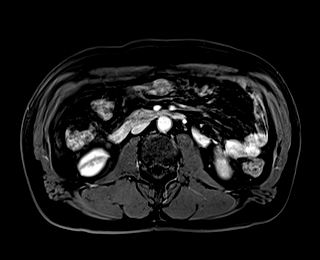
[im 88/88]
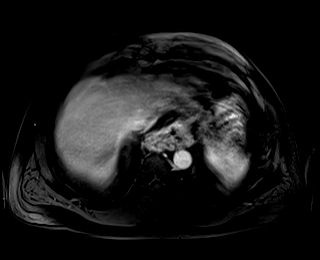

[Series 25: t1_vibe_fs_tra_p4_bh_post abd_sub · axial · 3.0mm · 1.19mm/px · z∈[+35,+296]mm · 3 of 88 slices shown (2 of 3)]
[im 1/88]
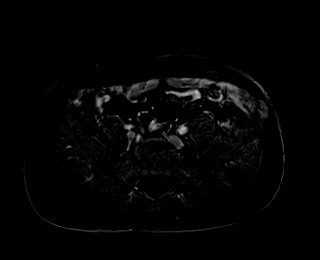
[im 44/88]
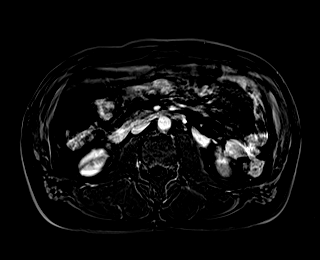
[im 88/88]
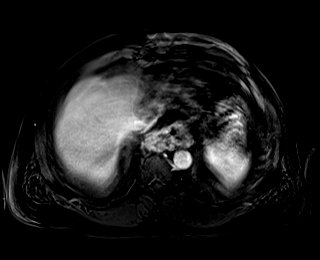

[Series 26: t1_vibe_fs_tra_p4_bh_post abd · axial · 3.0mm · 1.19mm/px · z∈[+35,+296]mm · 3 of 88 slices shown (3 of 3)]
[im 1/88]
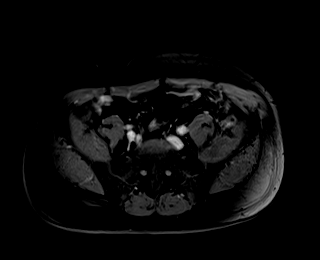
[im 44/88]
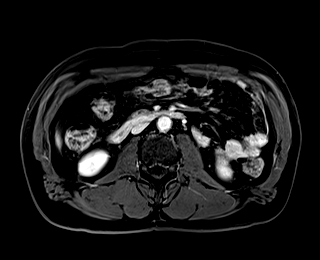
[im 88/88]
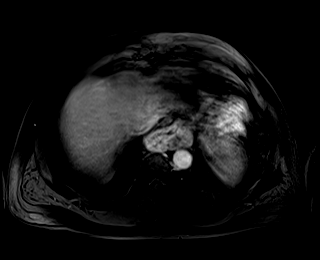

[Series 27: t1_vibe_fs_tra_p4_bh_post abd_sub · axial · 3.0mm · 1.19mm/px · z∈[+35,+164]mm · 2 of 88 slices shown (3 of 3)]
[im 1/88]
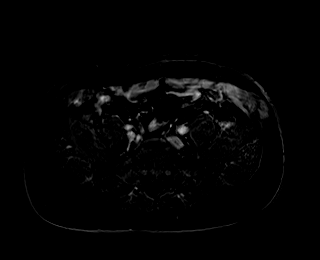
[im 44/88]
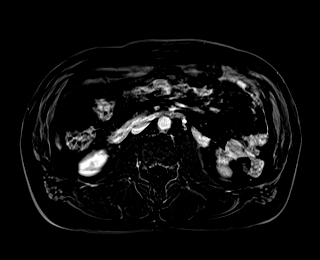

[42 of 48 positions shown; findings below may reference images not displayed]

FINDINGS: COMBINED FINDINGS FOR BOTH MR ABDOMEN AND PELVIS

Lower chest: No acute findings.

Hepatobiliary: 5 mm cyst noted within lateral segment of left
hepatic lobe. No suspicious enhancing liver lesions. Gallbladder
appears within normal limits. No biliary dilatation.

Pancreas: No mass, inflammatory changes, or other parenchymal
abnormality identified.

Spleen:  Within normal limits in size and appearance.

Adrenals/Urinary Tract: The adrenal glands appear normal. No
hydronephrosis or mass identified bilaterally. Focal areas of
scarring noted involving the upper and lower pole of the left
kidney. Urinary bladder is unremarkable.

Stomach/Bowel: Large hiatal hernia. No bowel wall thickening,
inflammation or distension.

Vascular/Lymphatic: Aortic atherosclerosis. No aneurysm. No
abdominal adenopathy.

Reproductive: Prostate is unremarkable.

Other: Corresponding to the recently noted CT abnormality is an
ovoid nodule within the anterior aspect of the left pelvis measuring
1.4 cm, image 15/29. This is T2 hypointense and T1 isointense. Thin,
uniform rim enhancement is noted without internal enhancement.

No free fluid or fluid collections identified.

Musculoskeletal: No suspicious bone lesions identified.
IMPRESSION: 1. Corresponding to the recently noted CT abnormality is a
nonaggressive appearing, well-circumscribed nodule within the
anterior left pelvis measuring 1.4 cm. The appearance is
nonspecific. No internal enhancement is identified. Although
indeterminate, lack of internal enhancement and relative stability
since prior exam favors a benign abnormality. Recommend follow-up
imaging in 3-6 months with pelvic CT with IV and oral contrast.
2. Hiatal hernia.
3. Liver cyst.

## 2021-05-18 ENCOUNTER — Other Ambulatory Visit: Payer: Self-pay | Admitting: Cardiovascular Disease

## 2021-06-02 ENCOUNTER — Other Ambulatory Visit: Payer: Self-pay

## 2021-06-02 DIAGNOSIS — E785 Hyperlipidemia, unspecified: Secondary | ICD-10-CM

## 2021-06-02 DIAGNOSIS — I5042 Chronic combined systolic (congestive) and diastolic (congestive) heart failure: Secondary | ICD-10-CM | POA: Diagnosis not present

## 2021-06-02 LAB — LIPID PANEL
Chol/HDL Ratio: 2.5 ratio (ref 0.0–5.0)
Cholesterol, Total: 82 mg/dL — ABNORMAL LOW (ref 100–199)
HDL: 33 mg/dL — ABNORMAL LOW (ref >39)
LDL Chol Calc (NIH): 37 mg/dL (ref 0–99)
Triglycerides: 43 mg/dL (ref 0–149)
VLDL Cholesterol Cal: 12 mg/dL (ref 5–40)

## 2021-06-03 ENCOUNTER — Encounter: Payer: Self-pay | Admitting: *Deleted

## 2021-06-04 ENCOUNTER — Ambulatory Visit: Payer: Medicare Other | Admitting: Cardiovascular Disease

## 2021-06-04 ENCOUNTER — Encounter: Payer: Self-pay | Admitting: Cardiovascular Disease

## 2021-06-04 ENCOUNTER — Other Ambulatory Visit: Payer: Self-pay

## 2021-06-04 DIAGNOSIS — I10 Essential (primary) hypertension: Secondary | ICD-10-CM | POA: Diagnosis not present

## 2021-06-04 DIAGNOSIS — E785 Hyperlipidemia, unspecified: Secondary | ICD-10-CM | POA: Diagnosis not present

## 2021-06-04 DIAGNOSIS — I2510 Atherosclerotic heart disease of native coronary artery without angina pectoris: Secondary | ICD-10-CM | POA: Diagnosis not present

## 2021-06-04 DIAGNOSIS — Z95810 Presence of automatic (implantable) cardiac defibrillator: Secondary | ICD-10-CM | POA: Diagnosis not present

## 2021-06-04 DIAGNOSIS — I519 Heart disease, unspecified: Secondary | ICD-10-CM | POA: Diagnosis not present

## 2021-06-04 DIAGNOSIS — B20 Human immunodeficiency virus [HIV] disease: Secondary | ICD-10-CM

## 2021-06-04 DIAGNOSIS — I472 Ventricular tachycardia, unspecified: Secondary | ICD-10-CM

## 2021-06-04 MED ORDER — SPIRONOLACTONE 25 MG PO TABS: ORAL_TABLET | ORAL | 3 refills | Status: DC

## 2021-06-04 NOTE — Progress Notes (Signed)
Patient ID: Richard [name], male   DOB: 12-30-1955, 65 y.o.   MRN: [id number]    HPI:  Richard [name] is a 65 y.o. male who presents for a 17 month follow-up cardiology evaluation.   Richard [name] has a history of a CVA in 2003 and has been followed at the [facility] hospital.   In 2003 Richard [name] underwent cardiac catheterization which  which did not reveal significant coronary artery disease. Prior to catheterization  ejection fraction was 33% with multiple areas of scar noted on a nuclear perfusion study.   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  Richard [name] states that for the past year 5 years Richard [name] has had undetected virus.  Richard [name] has a history of hypertension, as well as hyperlipidemia.  There is also a history of depression, for which Richard [name] takes Risperdal.  I initially saw him with complaints of some episodes of left-sided chest pain with walking, associated with left arm numbness and increasing shortness of breath with activity and is referred for nuclear study. Richard [name] underwent a recent nuclear perfusion study on 03/01/2014.  Ejection fraction was about 27%.  This showed extensive scar multiple coronary territories without definitive reversible ischemia.  An echo Doppler study  on 03/01/2014 showed an ejection fraction of 20-25% with severe global hypokinesis, but more pronounced inferior hypokinesis.  Richard [name] underwent cardiac catheterization by me on 04/02/2014 and was found to have severe  cardiomyopathy with ejection fraction of approximately 15%.  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  There is a 95% distal inferior LV branch stenosis of the distal circumflex and the RCA was totally occluded proximally with left to right collaterals.  Richard [name] had normal right heart pressures and there was no evidence for pulmonary  hypertension.  Richard [name] underwent surgical evaluation and was turned down.  The following day Richard [name] underwent successful intervention to his diagonal and LAD by Dr. Martinique with insertion of a 3.0x38 mm Promus DES stent in the mid LAD and 3 stents placed in the diagonal vessel.  I referred him to Dr. Sallyanne Kuster for implantation of a cardio defibrillator and a single-chamber ICD was inserted on 09/10/2014.   Richard [name] saw Dr. Recardo Evangelist on 12/31/2014 for follow-up evaluation. Interrogation revealed NSVT but without delivered therapy to date. Richard [name] is participating in [employer] remote follow-up.  A follow-up echo Doppler study on March 22, 2016 showed an EF which had improved to 30 to 35%.  There was grade 2 diastolic dysfunction, mild MR, and RV systolic function was moderately reduced.  Richard [name] is evaluated at the [facility] every 3-4 months for his HIV.  In early 2018 , Richard [name] had an ICD discharge after Richard [name] had run out of his metoprolol for 1 week and was shocked while sleeping.    When I saw him in 2018, Richard [name] remained stable from a cardiac standpoint.  Richard [name] has undetected HIV virus for greater than 5 years and continues to be on treatment.  Richard [name] walks on a treadmill and rides an exercise bike at least 3 to 4 days/week.  Richard [name] denies chest pain or shortness of breath.  Richard [name] saw Dr. Sallyanne Kuster in February 2019 for his ICD check.  Estimated longevity was 6.5 years.  Richard [name] had normal lead parameters and normal device function.  I saw him in May 2019 at  which time Richard [name] continued to do well and denied any chest pain, PND orthopnea or exertional dyspnea.  Richard [name] denied any defibrillator discharge and remained asymptomatic.  Richard [name] has had low blood pressures and for this reason Richard [name] denies chest pain.  Richard [name] denies PND orthopnea.  Richard [name] denies any recurrent defibrillator discharge.  Richard [name] essentially is asymptomatic.  Richard [name] has consistently had low blood pressures.  [facility] and Richard [name] was started on losartan.  Richard [name] saw Doreene Adas for  clinic  follow-up.  Richard [name] was doing much better on the losartan and was without cough. Blood pressure has increased slightly to 120/66 from his baseline of systolic in the 81L to low 100s.  Richard [name] is feeling much better with this change.  Richard [name] has not had to take PRN Lasix since January of this year.  Richard [name] was euvolemic on exam.    The patient complained about dysphagia that started the latter part of last year.  Richard [name] saw Dr. Benson Norway. The dysphagia is mostly with solid foods, which induces a cough and then vomiting.  For the past two years Richard [name] has been using pantoprazole and Richard [name] takes it QHS.  It helps his GERD in the form of a burning sensation. His HIV is undetectable with his current treatment x 7 years.  His CD4 count is around 650. Richard [name] underwent ECG with dilation on 09/08/2018.   I last saw him in a telemedicine visit on Dec 11, 2018.  Since his prior evaluation with me Richard [name] had run out of his metoprolol for several days and apparently had a defibrillator discharge.  Over the past year, Richard [name] continued to have issues with low blood pressure which typically runs in the 90s.  On some occasions his blood pressure has been as low as 88 but typically runs in the low to mid 90s.  When his blood pressure gets very low Richard [name] does admit to some lightheadedness particularly with walking.  Richard [name] denies any associated arrhythmia.  Richard [name] has not experienced any anginal type symptoms.  Because of his low blood pressure, Richard [name] was not transition to [employer].  Since I last saw him, Richard [name] has been evaluated by Dr. Sallyanne Kuster in March 2022 in follow-up of his ICD.  At that time Richard [name] was euvolemic without daily diuretics and remained unable to be transition to [facility] or higher doses of beta-blocker due to low blood pressure.  Presently, Richard [name] continues to feel well.  Richard [name] continues to be followed at the [place] in [place].  Richard [name] has undetectable virus.  Richard [name] denies chest pain.  Richard [name] denies any recurrent defibrillator discharge.  At times Richard [name] does note some very mild  shortness of breath.  His last echo Doppler study in May 2019 showed an EF of 25 to 30% with grade 2 diastolic dysfunction.  Richard [name] denies any chest pain.  Richard [name] believes Richard [name] is sleeping well.  Richard [name] presents for evaluation.   Past Medical History:  Diagnosis Date   Coronary artery disease    a. cath 04/02/2014 3v dx 80-90% in D1, 80-90% mid LAD, 95% apical LAD, 95% distal inferior LV branch of LCx, total occlusion of prox RCA  b). Cath 04/03/2014 DES to mid LAD, DES x3 to prox D1, DAPT indefinitely. LCx lesion untreated   HIV (human immunodeficiency virus infection) ([place])    Hypertension    Ischemic cardiomyopathy 2015   EF 20-25 percent by echo in 04/2014, s/p St[employer] Jude Fortify Assura model [id number] serial number [id number]  Stroke [facility])     Past Surgical History:  Procedure Laterality Date   CARDIAC CATHETERIZATION  04/02/2014   DR Claiborne Billings   ESOPHAGOGASTRODUODENOSCOPY (EGD) WITH PROPOFOL N/A 09/08/2018   Procedure: ESOPHAGOGASTRODUODENOSCOPY (EGD) WITH PROPOFOL;  Surgeon: Carol Ada, MD;  Location: [facility] ENDOSCOPY;  Service: Endoscopy;  Laterality: N/A;   IMPLANTABLE CARDIOVERTER DEFIBRILLATOR IMPLANT N/A 09/10/2014   Procedure: IMPLANTABLE CARDIOVERTER DEFIBRILLATOR IMPLANT;  Surgeon: Sanda Klein, MD;  [place] [phone] serial number [id number]   LEFT AND RIGHT HEART CATHETERIZATION WITH CORONARY ANGIOGRAM N/A 04/02/2014   Procedure: LEFT AND RIGHT HEART CATHETERIZATION WITH CORONARY ANGIOGRAM;  Surgeon: Troy Sine, MD;  Location: [facility] CATH LAB;  Service: Cardiovascular;  Laterality: N/A;   MEDIAL COLLATERAL LIGAMENT REPAIR, KNEE Right 08/25/2015   Procedure: RIGHT THUMB RADICAL COLLATERAL LIGAMENT REPAIR;  Surgeon: Charlotte Crumb, MD;  Location: [place];  Service: Orthopedics;  Laterality: Right;   PERCUTANEOUS CORONARY STENT INTERVENTION (PCI-S) N/A 04/03/2014   Procedure: PERCUTANEOUS CORONARY STENT INTERVENTION (PCI-S);  Surgeon: Peter M Martinique, MD;  Location: [facility] CATH LAB;   Service: Cardiovascular;  Laterality: N/A;   SAVORY DILATION N/A 09/08/2018   Procedure: SAVORY DILATION;  Surgeon: Carol Ada, MD;  Location: [facility] ENDOSCOPY;  Service: Endoscopy;  Laterality: N/A;    Allergies  Allergen Reactions   Lisinopril Cough   Ioxaglate Hives   Ivp Dye [Iodinated Diagnostic Agents] Hives    Current Outpatient Medications  Medication Sig Dispense Refill   abacavir-dolutegravir-lamiVUDine (TRIUMEQ) 740-81-448 MG tablet Take 1 tablet by mouth daily.     aspirin 81 MG chewable tablet Chew 81 mg by mouth daily.     atorvastatin (LIPITOR) 40 MG tablet Take 1 tablet by mouth once daily 90 tablet 0   Azelaic Acid 15 % cream azelaic acid 15 % topical gel  APPLY A SMALL AMOUNT ON FACE AT NIGHT TIME TWICE A WEEK     carboxymethylcellulose (REFRESH PLUS) 0.5 % SOLN INSTILL 1 DROP IN BOTH EYES FOUR TIMES A DAY     cetirizine (ZYRTEC) 10 MG tablet Take 10 mg by mouth daily.     DULoxetine (CYMBALTA) 30 MG capsule Take 30 mg by mouth 2 (two) times daily.     ezetimibe (ZETIA) 10 MG tablet Take 1 tablet (10 mg total) by mouth daily. 90 tablet 3   famotidine (PEPCID) 20 MG tablet famotidine 20 mg tablet  Take 1 tablet by mouth once daily     fluticasone (FLONASE) 50 MCG/ACT nasal spray Place 2 sprays into both nostrils daily as needed for rhinitis.     Lactobacillus (ACIDOPHILUS/PECTIN PO) Take 1 capsule by mouth daily.     losartan (COZAAR) 50 MG tablet TAKE ONE-HALF TABLET BY MOUTH ONCE A DAY     metoprolol succinate (TOPROL-XL) 25 MG 24 hr tablet metoprolol succinate ER 25 mg tablet,extended release 24 hr  TAKE 1 TABLET BY MOUTH ONCE DAILY FOR 90 DAYS     Multiple Vitamins-Minerals (CENTRUM SILVER PO) Take 2 tablets by mouth daily.     Omega-3 Fatty Acids (FISH OIL) 1000 MG CAPS Take by mouth.     pantoprazole (PROTONIX) 40 MG tablet Take 1 tablet (40 mg total) by mouth daily as needed. (Patient taking differently: Take 40 mg by mouth daily.) 30 tablet 0   Polyvinyl  Alcohol-Povidone PF 1.4-0.6 % SOLN Place 1 drop into both eyes 6 (six) times daily.     risperiDONE (RISPERDAL) 1 MG tablet Take 1.5 mg by mouth at bedtime.  spironolactone (ALDACTONE) 25 MG tablet TAKE ONE-HALF TABLET (12.5 MG ) EVERY THIRD DAY. 90 tablet 3   losartan (COZAAR) 50 MG tablet Take 50 mg by mouth daily.      No current facility-administered medications for this visit.    Socially, Richard [name] is married for 27 years.  Has one child.  Richard [name] is disabled and previously had worked as a Administrator.  Richard [name] completed 12th grade education.  There is no tobacco use.  Richard [name] does drink alcohol.  Family History  Problem Relation Age of Onset   Heart disease Mother     ROS General: Negative; No fevers, chills, or night sweats HEENT: Negative; No changes in vision or hearing, sinus congestion, difficulty swallowing Pulmonary: Negative; No cough, wheezing, shortness of breath, hemoptysis Cardiovascular:  See HPI; No chest pain, presyncope, syncope, palpitations, edema GI: Negative; No nausea, vomiting, diarrhea, or abdominal pain GU: Negative; No dysuria, hematuria, or difficulty voiding Musculoskeletal: Negative; no myalgias, joint pain, or weakness Hematologic/Oncologic: Positive for HIV well-controlled on chronic therapy for years with consistent undetectable virus on therapy; no easy bruising, bleeding Endocrine: Negative; no heat/cold intolerance; no diabetes Neuro: Negative; no changes in balance, headaches Skin: Negative; No rashes or skin lesions Psychiatric: History of depression. No behavioral problems,  Sleep: Negative; No daytime sleepiness, hypersomnolence, bruxism, restless legs, hypnogagnic hallucinations Other comprehensive 14 point system review is negative   Physical Exam BP 104/70 (BP Location: Left Arm, Patient Position: Sitting, Cuff Size: Normal)   Pulse 65   Ht $R'5\' 10"'Ao$  (1.778 m)   Wt 192 lb (87.1 kg)   SpO2 96%   BMI 27.55 kg/m    Repeat blood pressure by me was  114/70 supine and 100/66 standing  Wt Readings from Last 3 Encounters:  06/04/21 192 lb (87.1 kg)  10/27/20 197 lb 3.2 oz (89.4 kg)  01/16/20 202 lb (91.6 kg)   General: Alert, oriented, no distress.  Skin: normal turgor, no rashes, warm and dry HEENT: Normocephalic, atraumatic. Pupils equal round and reactive to light; sclera anicteric; extraocular muscles intact;  Nose without nasal septal hypertrophy Mouth/Parynx benign; Mallinpatti scale 3 Neck: No JVD, no carotid bruits; normal carotid upstroke Lungs: clear to ausculatation and percussion; no wheezing or rales Chest wall: without tenderness to palpitation Heart: PMI not displaced, RRR, s1 s2 normal, 1/6 systolic murmur, no diastolic murmur, no rubs, gallops, thrills, or heaves Abdomen: soft, nontender; no hepatosplenomehaly, BS+; abdominal aorta nontender and not dilated by palpation. Back: no CVA tenderness Pulses 2+ Musculoskeletal: full range of motion, normal strength, no joint deformities Extremities: no clubbing cyanosis or edema, Homan's sign negative  Neurologic: grossly nonfocal; Cranial nerves grossly wnl Psychologic: Normal mood and affect   ECG (independently read by me): Normal sinus rhythm at 65 bpm, occasional PVC, isolated, left axis deviation, inferior Q waves.  Lateral T wave abnormality.  Normal intervals.  Dec 09, 2017 ECG (independently read by me): Normal sinus rhythm at 62 bpm.  Isolated PVC.  Inferior Q waves.  No ectopy.  QTc interval 401 ms  March 2018 ECG (independently read by me): Sinus rhythm at 71 bpm. Two Isolated PVCs.  Right superior axis.  T-wave changes laterally  September 2017 ECG (independently read by me): Normal sinus rhythm at 73 bpm with occasional PVC.  Right superior axis.  QTc interval 456 ms  February 2017 ECG (independently read by me):  Normal sinus rhythm at 61 bpm.  No ectopy.  Normal intervals.  QTC 404 ms.  August 2016ECG (independently  read by me): Sinus rhythm at 67 bpm.   Isolated PVC.  Right superior axis.  Anterolateral T-wave changes.  June 2016 ECG (independently read by me):  Normal sinus rhythm at 65 bpm with an occasional PVC with VA conduction  February 2016ECG (independently read by me): Normal sinus rhythm at 63 bpm.  Possible old inferior infarct.  Lateral T-wave changes.  Right superior axis  November 2015 ECG (independently read by me):  Normal sinus rhythm.  Probable old IMI.  T-wave changes V3 through V6.  ECG (independently read by me): normal sinus rhythm at 64 beats per minute.  T-wave inversion V3 through V6,  lead 1, 2 and aVF.  Prior 02/20/2014 ECG (independently read by me): Sinus rhythm with frequent PVCs with transient trigeminy, and evidence for VA conduction with his ectopy.  LABS:  BMP Latest Ref Rng & Units 08/23/2020 01/17/2020 10/01/2016  Glucose 70 - 99 mg/dL 100(H) 96 64(L)  BUN 8 - 23 mg/dL 11 17 10   Creatinine 0.61 - 1.24 mg/dL 1.47(H) 1.51(H) 1.38(H)  BUN/Creat Ratio 10 - 24 - 11 -  Sodium 135 - 145 mmol/L 137 138 139  Potassium 3.5 - 5.1 mmol/L 3.7 4.7 4.1  Chloride 98 - 111 mmol/L 104 102 105  CO2 22 - 32 mmol/L 24 21 24   Calcium 8.9 - 10.3 mg/dL 9.0 9.8 8.9   Hepatic Function Latest Ref Rng & Units 08/23/2020 01/17/2020 10/01/2016  Total Protein 6.5 - 8.1 g/dL 7.1 7.8 7.3  Albumin 3.5 - 5.0 g/dL 3.8 4.7 4.1  AST 15 - 41 U/L 26 32 17  ALT 0 - 44 U/L 28 39 19  Alk Phosphatase 38 - 126 U/L 55 82 60  Total Bilirubin 0.3 - 1.2 mg/dL 0.6 0.5 0.6   CBC Latest Ref Rng & Units 08/23/2020 08/25/2015 09/04/2014  WBC 4.0 - 10.5 K/uL 4.3 4.5 3.1(L)  Hemoglobin 13.0 - 17.0 g/dL 13.7 14.9 13.7  Hematocrit 39.0 - 52.0 % 41.1 42.8 41.6  Platelets 150 - 400 K/uL 135(L) 108(L) 141(L)   Lab Results  Component Value Date   MCV 85.4 08/23/2020   MCV 79.7 08/25/2015   MCV 80.9 09/04/2014   Lab Results  Component Value Date   TSH 0.917 09/25/2014   Lipid Panel     Component Value Date/Time   CHOL 82 (L) 06/02/2021 0834   TRIG  43 06/02/2021 0834   HDL 33 (L) 06/02/2021 0834   CHOLHDL 2.5 06/02/2021 0834   CHOLHDL 3.9 09/05/2015 0848   VLDL 17 09/05/2015 0848   LDLCALC 37 06/02/2021 0834    RADIOLOGY: Dg Chest 2 View  02/04/2014   CLINICAL DATA:  Chest and left arm pain.  EXAM: CHEST  2 VIEW  COMPARISON:  None.  FINDINGS: The lungs are clear. Heart size is normal. No pneumothorax or pleural effusion. No focal bony abnormality.  IMPRESSION: Negative chest.   Electronically Signed   By: Inge Rise M.D.   On: 02/04/2014 10:53   IMPRESSION:  1. Coronary artery disease involving native coronary artery of native heart without angina pectoris   2. Ischemic cardiomyopathy   3. Hyperlipidemia LDL goal <70   4. ICD (implantable cardioverter-defibrillator) in place   5. VT (ventricular tachycardia)   6. Medication management   7. Human immunodeficiency virus (HIV) disease ([place])     ASSESSMENT AND PLAN: Mr. Waylen Depaolo is a 65 year old, African American male who suffered a CVA in 2003.   In 2003  Richard [name] was documented to have  a cardiomyopathy   Richard [name] developed new chest tightness and increasing shortness of breath which led to cardiac catheterization in August 2015.  His ejection fraction was 15% and Richard [name] underwent successful intervention to a diffusely diseased diagonal and LAD system.  His RCA is occluded and is collateralized and Richard [name] had very distal circumflex stenosis.  Richard [name] has not had any recurrent anginal symptoms since his intervention.  Following revascularization LV function remained low and  Richard [name] underwent prophylactic ICD implantation in light of increased sudden cardiac death risk.  A subsequent echo Doppler study in 2017 showed an EF of 30 to 35% with grade 2 diastolic dysfunction, mild MR, and moderately reduced RV function.  Several years ago, Richard [name] had run out of his metoprolol dose and developed ventricular fibrillation and received inappropriate ICD shock.  Subsequently, Richard [name] has not had any recurrence.  Richard [name]  continues to be followed by Dr. Sallyanne Kuster for his device checks in 1 most recently evaluated in March 2022 Richard [name] had normal lead parameters including capture threshold.  Presently, Richard [name] remains angina free.  Richard [name] has experienced some very mild shortness of breath.  Richard [name] appears fairly euvolemic on exam.  His blood pressure today by me was 114/70 supine and 100/66 standing.  In the past Richard [name] is not felt to be a candidate for transition to [facility] due to his consistently low blood pressures.  Richard [name] is currently on atorvastatin 40 mg and Zetia 10 mg for aggressive lipid-lowering therapy.  LDL cholesterol in October 2022 was 37.  Richard [name] continues to be on losartan 50 mg daily and metoprolol succinate 25 mg for his cardiomyopathy.  Presently, I will try to add very low-dose spironolactone at 12.5 mg every third day for additional aldosterone blockade in light of his continued reduced LV function.  Richard [name] has not had a recent echo Doppler study and I am recommending a follow-up evaluation.  I will recheck a CMP and BNP in several weeks following initiation of spironolactone.  Richard [name] continues to be followed closely at the [facility] several times a year.  Richard [name] is HIV is well controlled on triple drug therapy with Triumeq, and Richard [name] has consistently had undetectable virus for over 10 years.  Richard [name] is on Cymbalta for depression.  I will see him in 2 to 3 months for follow-up evaluation or sooner as needed.    Troy Sine, MD, [facility] 06/06/2021 10:26 AM

## 2021-06-04 NOTE — Patient Instructions (Signed)
Medication Instructions:  TAKE SPIRONOLACTONE ONE-HALF TABLET (12.5 MG) EVERY THIRD DAY.  *If you need a refill on your cardiac medications before your next appointment, please call your pharmacy*  Lab Work: [place] FOR LAB WORK (BNP, CMET) If you have labs (blood work) drawn today and your tests are completely normal, you will receive your results only by: [place] (if you have MyChart) OR A paper copy in the mail If you have any lab test that is abnormal or we need to change your treatment, we will call you to review the results.  Testing/Procedures:Your physician has requested that you have an echocardiogram. Echocardiography is a painless test that uses sound waves to create images of your heart. It provides your doctor with information about the size and shape of your heart and how well your heart's chambers and valves are working. This procedure takes approximately one hour. There are no restrictions for this procedure.  Follow-Up: At [facility], you and your health needs are our priority.  As part of our continuing mission to provide you with exceptional heart care, we have created designated Provider Care Teams.  These Care Teams include your primary Cardiologist (physician) and Advanced Practice Providers (APPs -  Physician Assistants and Nurse Practitioners) who all work together to provide you with the care you need, when you need it.  We recommend signing up for the patient portal called "MyChart".  Sign up information is provided on this After Visit Summary.  MyChart is used to connect with patients for Virtual Visits (Telemedicine).  Patients are able to view lab/test results, encounter notes, upcoming appointments, etc.  Non-urgent messages can be sent to your provider as well.   To learn more about what you can do with MyChart, go to [email or web address].    Your next appointment:   2 month(s)  The format for your next appointment:   In  Person  Provider:   Shelva Majestic, MD

## 2021-06-06 ENCOUNTER — Encounter: Payer: Self-pay | Admitting: Cardiovascular Disease

## 2021-06-16 ENCOUNTER — Other Ambulatory Visit (HOSPITAL_COMMUNITY): Payer: Medicare Other

## 2021-06-17 ENCOUNTER — Other Ambulatory Visit: Payer: Self-pay

## 2021-06-17 ENCOUNTER — Ambulatory Visit (HOSPITAL_COMMUNITY): Payer: Medicare Other | Attending: Cardiology

## 2021-06-17 DIAGNOSIS — I519 Heart disease, unspecified: Secondary | ICD-10-CM

## 2021-06-17 DIAGNOSIS — I10 Essential (primary) hypertension: Secondary | ICD-10-CM

## 2021-06-17 LAB — ECHOCARDIOGRAM COMPLETE
Area-P 1/2: 4.1 cm2
S' Lateral: 4.1 cm

## 2021-06-30 ENCOUNTER — Telehealth: Payer: Self-pay | Admitting: Cardiovascular Disease

## 2021-06-30 NOTE — Telephone Encounter (Signed)
Pt states he will be out of the country for a few weeks starting  June 2023 and needs to know if there's anything he needs to be aware of about his defibrillator or current heart condition. Pt has an upcoming appt January 2023

## 2021-06-30 NOTE — Telephone Encounter (Signed)
Successful telephone encounter to patient to address his questions regarding travel to [place] and [place] next year. Discussed with patient that travel was too far in the future to give guidance at this time. Informed patient that his device is functioning normally at this time per remotes and last office visit with Dr. Recardo Evangelist 10/27/20. Patient has follow up appt with Coletta Memos 09/01/21. Will discuss remote transmission capability from destinations with industry prior to vacations. Patient appreciative of follow up.

## 2021-07-10 ENCOUNTER — Ambulatory Visit (INDEPENDENT_AMBULATORY_CARE_PROVIDER_SITE_OTHER): Payer: Medicare Other

## 2021-07-10 DIAGNOSIS — I519 Heart disease, unspecified: Secondary | ICD-10-CM

## 2021-07-10 LAB — CUP PACEART REMOTE DEVICE CHECK
Battery Remaining Longevity: 43 mo
Battery Remaining Percentage: 39 %
Battery Voltage: 2.96 V
Brady Statistic RV Percent Paced: 1 %
Date Time Interrogation Session: 20221202020018
HighPow Impedance: 62 Ohm
HighPow Impedance: 62 Ohm
Implantable Lead Implant Date: 20160202
Implantable Lead Location: 753860
Implantable Pulse Generator Implant Date: 20160202
Lead Channel Impedance Value: 360 Ohm
Lead Channel Pacing Threshold Amplitude: 1 V
Lead Channel Pacing Threshold Pulse Width: 0.5 ms
Lead Channel Sensing Intrinsic Amplitude: 10 mV
Lead Channel Setting Pacing Amplitude: 2.5 V
Lead Channel Setting Pacing Pulse Width: 0.5 ms
Lead Channel Setting Sensing Sensitivity: 0.5 mV
Pulse Gen Serial Number: 7229147

## 2021-07-12 DIAGNOSIS — R1900 Intra-abdominal and pelvic swelling, mass and lump, unspecified site: Secondary | ICD-10-CM | POA: Insufficient documentation

## 2021-07-21 NOTE — Progress Notes (Signed)
Remote ICD transmission.   

## 2021-08-28 ENCOUNTER — Other Ambulatory Visit: Payer: Self-pay | Admitting: Cardiovascular Disease

## 2021-08-31 NOTE — Progress Notes (Signed)
Cardiology Clinic Note   Patient Name: Richard [name] Date of Encounter: 09/01/2021  Primary Care Provider:  Audley Hose, MD Primary Cardiologist:  Shelva Majestic, MD  Patient Profile    Richard [name] 66 year old male presents the clinic today for follow-up evaluation of his ischemic cardiomyopathy status post ICD (2/16), essential hypertension, and unstable angina.  Past Medical History    Past Medical History:  Diagnosis Date   Coronary artery disease    a. cath 04/02/2014 3v dx 80-90% in D1, 80-90% mid LAD, 95% apical LAD, 95% distal inferior LV branch of LCx, total occlusion of prox RCA  b). Cath 04/03/2014 DES to mid LAD, DES x3 to prox D1, DAPT indefinitely. LCx lesion untreated   HIV (human immunodeficiency virus infection) ([place])    Hypertension    Ischemic cardiomyopathy 2015   EF 20-25 percent by echo in 04/2014, s/p [employer] [place] model CD1357-40Q serial number [id number]   Stroke [facility])    Past Surgical History:  Procedure Laterality Date   CARDIAC CATHETERIZATION  04/02/2014   DR Claiborne Billings   ESOPHAGOGASTRODUODENOSCOPY (EGD) WITH PROPOFOL N/A 09/08/2018   Procedure: ESOPHAGOGASTRODUODENOSCOPY (EGD) WITH PROPOFOL;  Surgeon: Carol Ada, MD;  Location: [facility];  Service: Endoscopy;  Laterality: N/A;   IMPLANTABLE CARDIOVERTER DEFIBRILLATOR IMPLANT N/A 09/10/2014   Procedure: IMPLANTABLE CARDIOVERTER DEFIBRILLATOR IMPLANT;  Surgeon: Sanda Klein, MD;  [place] [id number] serial number [id number]   LEFT AND RIGHT HEART CATHETERIZATION WITH CORONARY ANGIOGRAM N/A 04/02/2014   Procedure: LEFT AND RIGHT HEART CATHETERIZATION WITH CORONARY ANGIOGRAM;  Surgeon: Troy Sine, MD;  Location: [facility] CATH LAB;  Service: Cardiovascular;  Laterality: N/A;   MEDIAL COLLATERAL LIGAMENT REPAIR, KNEE Right 08/25/2015   Procedure: RIGHT THUMB RADICAL COLLATERAL LIGAMENT REPAIR;  Surgeon: Charlotte Crumb, MD;  Location: [place];  Service: Orthopedics;   Laterality: Right;   PERCUTANEOUS CORONARY STENT INTERVENTION (PCI-S) N/A 04/03/2014   Procedure: PERCUTANEOUS CORONARY STENT INTERVENTION (PCI-S);  Surgeon: Peter M Martinique, MD;  Location: [facility] CATH LAB;  Service: Cardiovascular;  Laterality: N/A;   SAVORY DILATION N/A 09/08/2018   Procedure: SAVORY DILATION;  Surgeon: Carol Ada, MD;  Location: [facility];  Service: Endoscopy;  Laterality: N/A;    Allergies  Allergies  Allergen Reactions   Iodine    Lisinopril Cough   Ioxaglate Hives   Ivp Dye [Iodinated Contrast Media] Hives    History of Present Illness    Richard [name] has a PMH of essential hypertension, cardiomyopathy, unstable angina, coronary artery disease, chronic combined systolic and diastolic CHF, NSVT, CVA, HIV, hyperlipidemia, depression, and ICD implanted 2/16.  He was seen by Dr. Claiborne Billings via telemedicine visit 5/20.  During that time he reported he had run out of his metoprolol for several days and had to his ICD discharge.  He reported he had continued issues with low blood pressure with systolic blood pressures in the 90s.  He also had occasional blood pressures with systolics in the 39J.  With low blood pressure he reported lightheadedness with walking.  He denied any palpitations or irregular heartbeats.  He denied anginal type symptoms.  He was not a candidate for Entresto due to his low blood pressure.  He followed up with Dr. Sallyanne Kuster on 5/22.  His ICD was reviewed.  At that time he was euvolemic on and did not require diuretics.  He was remained unable to transition to [facility] or a higher dose of his beta-blocker due to his low blood pressure.  He was seen in follow-up by Dr. Claiborne Billings on 06/04/2021.  During that time he continued to feel well.  He was also followed by the [place] in [place].  He was virus undetectable.  He denied chest pain.  He denied recurrent discharges from his defibrillator.  He did note some mild shortness of breath.  His echocardiogram 5/19  showed an LVEF of 25-30% with G2 DD.  He denied chest pain.  He felt he was sleeping well.  He presents to the clinic today for follow-up evaluation states he feels well.  His blood pressure initially was 80/70 and on recheck it was 84/68.  He reports that he tries to stay well-hydrated.  He has been walking on the treadmill 3-4 times per week and reports that he is a heavy sweater.  I have asked him to weigh himself both before and after he gets off the treadmill.  He denies lightheadedness and dizzy spells.  I will decrease his losartan to 12.5 mg daily, have him increase his p.o. hydration slightly, maintain his physical activity and current diet.  We will plan follow-up for 3 to 4 months.  I have also asked him to maintain blood pressure log and contact the office with blood pressures in the 47Q-25Z systolic.  Today he denies chest pain, shortness of breath, lower extremity edema, fatigue, palpitations, melena, hematuria, hemoptysis, diaphoresis, weakness, presyncope, syncope, orthopnea, and PND.   Home Medications    Prior to Admission medications   Medication Sig Start Date End Date Taking? Authorizing Provider  abacavir-dolutegravir-lamiVUDine (TRIUMEQ) 600-50-300 MG tablet Take 1 tablet by mouth daily.    [provider]  aspirin 81 MG chewable tablet Chew 81 mg by mouth daily.    [provider]  atorvastatin (LIPITOR) 40 MG tablet Take 1 tablet by mouth once daily 08/28/21   Croitoru, Mihai, MD  Azelaic Acid 15 % cream azelaic acid 15 % topical gel  APPLY A SMALL AMOUNT ON FACE AT NIGHT TIME TWICE A WEEK 09/20/19   [provider]  carboxymethylcellulose (REFRESH PLUS) 0.5 % SOLN INSTILL 1 DROP IN BOTH EYES FOUR TIMES A DAY 07/09/20   [provider]  cetirizine (ZYRTEC) 10 MG tablet Take 10 mg by mouth daily.    [provider]  DULoxetine (CYMBALTA) 30 MG capsule Take 30 mg by mouth 2 (two) times daily.    [provider]  ezetimibe  (ZETIA) 10 MG tablet Take 1 tablet (10 mg total) by mouth daily. 10/27/20 06/04/21  Croitoru, Mihai, MD  famotidine (PEPCID) 20 MG tablet famotidine 20 mg tablet  Take 1 tablet by mouth once daily    [provider]  fluticasone (FLONASE) 50 MCG/ACT nasal spray Place 2 sprays into both nostrils daily as needed for rhinitis.    [provider]  Lactobacillus (ACIDOPHILUS/PECTIN PO) Take 1 capsule by mouth daily.    [provider]  losartan (COZAAR) 50 MG tablet Take 50 mg by mouth daily.     [provider]  losartan (COZAAR) 50 MG tablet TAKE ONE-HALF TABLET BY MOUTH ONCE A DAY 02/17/21   [provider]  metoprolol succinate (TOPROL-XL) 25 MG 24 hr tablet metoprolol succinate ER 25 mg tablet,extended release 24 hr  TAKE 1 TABLET BY MOUTH ONCE DAILY FOR 90 DAYS    [provider]  Multiple Vitamins-Minerals (CENTRUM SILVER PO) Take 2 tablets by mouth daily.    [provider]  Omega-3 Fatty Acids (FISH OIL) 1000 MG CAPS Take by  mouth.    [provider]  pantoprazole (PROTONIX) 40 MG tablet Take 1 tablet (40 mg total) by mouth daily as needed. Patient taking differently: Take 40 mg by mouth daily. 04/04/14   Almyra Deforest, PA  Polyvinyl Alcohol-Povidone PF 1.4-0.6 % SOLN Place 1 drop into both eyes 6 (six) times daily.    [provider]  risperiDONE (RISPERDAL) 1 MG tablet Take 1.5 mg by mouth at bedtime.    [provider]  spironolactone (ALDACTONE) 25 MG tablet TAKE ONE-HALF TABLET (12.5 MG ) EVERY THIRD DAY. 06/04/21   Troy Sine, MD    Family History    Family History  Problem Relation Age of Onset   Heart disease Mother    He indicated that his mother is deceased. He indicated that his father is deceased. He indicated that his sister is alive. He indicated that his brother is alive.  Social History    Social History   Socioeconomic History   Marital status: Married    Spouse name: Not on  file   Number of children: Not on file   Years of education: Not on file   Highest education level: Not on file  Occupational History   Not on file  Tobacco Use   Smoking status: Never   Smokeless tobacco: Never  Substance and Sexual Activity   Alcohol use: No   Drug use: No   Sexual activity: Not on file  Other Topics Concern   Not on file  Social History Narrative   Not on file   Social Determinants of Health   Financial Resource Strain: Not on file  Food Insecurity: Not on file  Transportation Needs: Not on file  Physical Activity: Not on file  Stress: Not on file  Social Connections: Not on file  Intimate Partner Violence: Not on file     Review of Systems    General:  No chills, fever, night sweats or weight changes.  Cardiovascular:  No chest pain, dyspnea on exertion, edema, orthopnea, palpitations, paroxysmal nocturnal dyspnea. Dermatological: No rash, lesions/masses Respiratory: No cough, dyspnea Urologic: No hematuria, dysuria Abdominal:   No nausea, vomiting, diarrhea, bright red blood per rectum, melena, or hematemesis Neurologic:  No visual changes, wkns, changes in mental status. All other systems reviewed and are otherwise negative except as noted above.  Physical Exam    VS:  BP (!) 84/68 (BP Location: Left Arm, Patient Position: Sitting, Cuff Size: Normal)    Pulse 70    Ht 5\' 10"  (1.778 m)    Wt 188 lb 3.2 oz (85.4 kg)    SpO2 99%    BMI 27.00 kg/m  , BMI Body mass index is 27 kg/m. GEN: Well nourished, well developed, in no acute distress. HEENT: normal. Neck: Supple, no JVD, carotid bruits, or masses. Cardiac: RRR, no murmurs, rubs, or gallops. No clubbing, cyanosis, edema.  Radials/DP/PT 2+ and equal bilaterally.  Respiratory:  Respirations regular and unlabored, clear to auscultation bilaterally. GI: Soft, nontender, nondistended, BS + x 4. MS: no deformity or atrophy. Skin: warm and dry, no rash. Neuro:  Strength and sensation are  intact. Psych: Normal affect.  Accessory Clinical Findings    Recent Labs: No results found for requested labs within last 8760 hours.   Recent Lipid Panel    Component Value Date/Time   CHOL 82 (L) 06/02/2021 0834   TRIG 43 06/02/2021 0834   HDL 33 (L) 06/02/2021 0834   CHOLHDL 2.5 06/02/2021 0834   CHOLHDL  3.9 09/05/2015 0848   VLDL 17 09/05/2015 0848   LDLCALC 37 06/02/2021 0834    ECG personally reviewed by me today-none today.  Echocardiogram 06/17/2021  IMPRESSIONS     1. Left ventricular ejection fraction, by estimation, is 30 to 35%. The  left ventricle has moderately decreased function. The left ventricle  demonstrates global hypokinesis. Left ventricular diastolic parameters are  consistent with Grade II diastolic  dysfunction (pseudonormalization).   2. Right ventricular systolic function is normal. The right ventricular  size is mildly enlarged. There is normal pulmonary artery systolic  pressure. The estimated right ventricular systolic pressure is 34.9 mmHg.   3. Left atrial size was mildly dilated.   4. The mitral valve is normal in structure. Mild to moderate mitral valve  regurgitation. No evidence of mitral stenosis.   5. Tricuspid valve regurgitation is mild to moderate.   6. The aortic valve is normal in structure. Aortic valve regurgitation is  not visualized. No aortic stenosis is present.   7. The inferior vena cava is normal in size with greater than 50%  respiratory variability, suggesting right atrial pressure of 3 mmHg.  Assessment & Plan   1.  Coronary artery disease-denies recent episodes of chest pain. Continue atorvastatin, ezetimibe, metoprolol, losartan Heart healthy low-sodium diet-salty 6 given Increase physical activity as tolerated  Ischemic cardiomyopathy-no increased DOE or activity intolerance.  Echocardiogram 06/17/2021 showed an improvement in his EF up to 30-35%, G2 DD, mildly dilated left atria, and mild-moderate mitral  valve regurgitation.  He is previously not a candidate for Entresto due to his low blood pressure. Continue metoprolol, spironolactone Heart healthy low-sodium diet-salty 6 given Increase physical activity as tolerated Decrease losartan to 12.5 mg daily Maintain BP log  Hyperlipidemia-06/02/2021: Cholesterol, Total 82; HDL 33; LDL Chol Calc (NIH) 37; Triglycerides 43 Continue ezetimibe, atorvastatin Heart healthy low-sodium high-fiber diet Increase physical activity as tolerated  Implantable cardioverter-defibrillator-denies recent discharges.  Implanted 2/16.  Remote interrogation 07/10/2021 showed he is not pacemaker dependent, good battery status, stable lead measurements, and favorable heart rate histogram. Follows with Dr. Sallyanne Kuster  HIV-continues to [facility] with [facility].  Reports compliance with triple therapy.  Continues to have undetectable virus. [facility]  Disposition: Follow-up with Dr. Claiborne Billings or me in 3-4 months.  Jossie Ng. Tavon Magnussen NP-C    09/01/2021, 10:37 AM [address] Office [phone] Fax [phone]  Notice: This dictation was prepared with Dragon dictation along with smaller phrase technology. Any transcriptional errors that result from this process are unintentional and may not be corrected upon review.  I spent 14 minutes examining this patient, reviewing medications, and using patient centered shared decision making involving her cardiac care.  Prior to her visit I spent greater than 20 minutes reviewing her past medical history,  medications, and prior cardiac tests.

## 2021-09-01 ENCOUNTER — Other Ambulatory Visit: Payer: Self-pay

## 2021-09-01 ENCOUNTER — Encounter: Payer: Self-pay | Admitting: General Practice

## 2021-09-01 ENCOUNTER — Ambulatory Visit: Payer: Medicare Other | Admitting: General Practice

## 2021-09-01 VITALS — BP 84/68 | HR 70 | Ht 70.0 in | Wt 188.2 lb

## 2021-09-01 DIAGNOSIS — Z95810 Presence of automatic (implantable) cardiac defibrillator: Secondary | ICD-10-CM

## 2021-09-01 DIAGNOSIS — B20 Human immunodeficiency virus [HIV] disease: Secondary | ICD-10-CM

## 2021-09-01 DIAGNOSIS — I2510 Atherosclerotic heart disease of native coronary artery without angina pectoris: Secondary | ICD-10-CM

## 2021-09-01 DIAGNOSIS — E785 Hyperlipidemia, unspecified: Secondary | ICD-10-CM | POA: Diagnosis not present

## 2021-09-01 DIAGNOSIS — I519 Heart disease, unspecified: Secondary | ICD-10-CM | POA: Diagnosis not present

## 2021-09-01 MED ORDER — LOSARTAN POTASSIUM 25 MG PO TABS: 12.5000 mg | ORAL_TABLET | Freq: Every day | ORAL | 6 refills | Status: DC

## 2021-09-01 NOTE — Patient Instructions (Signed)
Medication Instructions:  DECREASE LOSARTAN 12.5MG  DAILY  *If you need a refill on your cardiac medications before your next appointment, please call your pharmacy*  Lab Work:   Testing/Procedures:  NONE    NONE  Special Instructions PLEASE INCREASE HYDRATION WHILE EXERCISING AND SWEATING  MAINTAIN YOUR DIET AND ACTIVITY  Follow-Up: Your next appointment:  3-4 month(s) In Person with Shelva Majestic, MD  or Coletta Memos, FNP      At [facility], you and your health needs are our priority.  As part of our continuing mission to provide you with exceptional heart care, we have created designated Provider Care Teams.  These Care Teams include your primary Cardiologist (physician) and Advanced Practice Providers (APPs -  Physician Assistants and Nurse Practitioners) who all work together to provide you with the care you need, when you need it.  We recommend signing up for the patient portal called "MyChart".  Sign up information is provided on this After Visit Summary.  MyChart is used to connect with patients for Virtual Visits (Telemedicine).  Patients are able to view lab/test results, encounter notes, upcoming appointments, etc.  Non-urgent messages can be sent to your provider as well.   To learn more about what you can do with MyChart, go to [email or web address].

## 2021-09-02 DIAGNOSIS — R1310 Dysphagia, unspecified: Secondary | ICD-10-CM | POA: Diagnosis not present

## 2021-09-02 DIAGNOSIS — I5022 Chronic systolic (congestive) heart failure: Secondary | ICD-10-CM | POA: Diagnosis not present

## 2021-09-02 DIAGNOSIS — L819 Disorder of pigmentation, unspecified: Secondary | ICD-10-CM | POA: Diagnosis not present

## 2021-09-02 DIAGNOSIS — K219 Gastro-esophageal reflux disease without esophagitis: Secondary | ICD-10-CM | POA: Diagnosis not present

## 2021-09-02 DIAGNOSIS — D696 Thrombocytopenia, unspecified: Secondary | ICD-10-CM | POA: Diagnosis not present

## 2021-09-02 DIAGNOSIS — I10 Essential (primary) hypertension: Secondary | ICD-10-CM | POA: Diagnosis not present

## 2021-09-02 DIAGNOSIS — R1900 Intra-abdominal and pelvic swelling, mass and lump, unspecified site: Secondary | ICD-10-CM | POA: Diagnosis not present

## 2021-09-02 DIAGNOSIS — I700 Atherosclerosis of aorta: Secondary | ICD-10-CM | POA: Diagnosis not present

## 2021-09-04 DIAGNOSIS — I10 Essential (primary) hypertension: Secondary | ICD-10-CM | POA: Diagnosis not present

## 2021-10-09 ENCOUNTER — Ambulatory Visit (INDEPENDENT_AMBULATORY_CARE_PROVIDER_SITE_OTHER): Payer: Medicare Other

## 2021-10-09 DIAGNOSIS — I519 Heart disease, unspecified: Secondary | ICD-10-CM

## 2021-10-09 LAB — CUP PACEART REMOTE DEVICE CHECK
Battery Remaining Longevity: 40 mo
Battery Remaining Percentage: 37 %
Battery Voltage: 2.96 V
Brady Statistic RV Percent Paced: 1 %
Date Time Interrogation Session: 20230303024324
HighPow Impedance: 56 Ohm
HighPow Impedance: 56 Ohm
Implantable Lead Implant Date: 20160202
Implantable Lead Location: 753860
Implantable Pulse Generator Implant Date: 20160202
Lead Channel Impedance Value: 340 Ohm
Lead Channel Pacing Threshold Amplitude: 1 V
Lead Channel Pacing Threshold Pulse Width: 0.5 ms
Lead Channel Sensing Intrinsic Amplitude: 9.7 mV
Lead Channel Setting Pacing Amplitude: 2.5 V
Lead Channel Setting Pacing Pulse Width: 0.5 ms
Lead Channel Setting Sensing Sensitivity: 0.5 mV
Pulse Gen Serial Number: 7229147

## 2021-10-19 NOTE — Progress Notes (Signed)
Remote ICD transmission.   

## 2021-10-30 ENCOUNTER — Other Ambulatory Visit: Payer: Self-pay | Admitting: Cardiovascular Disease

## 2021-10-30 MED ORDER — EZETIMIBE 10 MG PO TABS: 10.0000 mg | ORAL_TABLET | Freq: Every day | ORAL | 2 refills | Status: DC

## 2021-11-09 ENCOUNTER — Other Ambulatory Visit: Payer: Self-pay | Admitting: Cardiovascular Disease

## 2021-11-09 ENCOUNTER — Telehealth: Payer: Self-pay

## 2021-11-09 DIAGNOSIS — E7849 Other hyperlipidemia: Secondary | ICD-10-CM | POA: Diagnosis not present

## 2021-11-09 DIAGNOSIS — I472 Ventricular tachycardia, unspecified: Secondary | ICD-10-CM | POA: Diagnosis not present

## 2021-11-09 DIAGNOSIS — I10 Essential (primary) hypertension: Secondary | ICD-10-CM | POA: Diagnosis not present

## 2021-11-09 DIAGNOSIS — I2510 Atherosclerotic heart disease of native coronary artery without angina pectoris: Secondary | ICD-10-CM | POA: Diagnosis not present

## 2021-11-09 NOTE — Telephone Encounter (Signed)
Spoke with patient he stated he would get his labs drawn today orders placed ?

## 2021-11-09 NOTE — Telephone Encounter (Signed)
Alert from [employer] : ? ?[place] alert for sustained VT with successful ATP therapy ?Event occurred 3/30 @ 08:41, EGM shows sustained VT, HR 235 falling in to the VF zone ?ATP delivered x1 converting to VS with ectopy ?[place] trended down during March ? ?Spoke with patient stated he was on the treadmill and got weak and fell to his knees patient denied any illnesses or missed medications, patient voiced understanding of [place] driving restrictions x 6 months.  ? ? ? ? ?

## 2021-11-09 NOTE — Telephone Encounter (Signed)
Spoke to the patient. Appointment made for 4/5 with Dr. Sallyanne Kuster ?

## 2021-11-09 NOTE — Telephone Encounter (Signed)
Can we please have him get labs (BMET, Mg, BNP, TSH) ASAP and add him on to see me this week? ?

## 2021-11-10 LAB — TSH: TSH: 1.05 u[IU]/mL (ref 0.450–4.500)

## 2021-11-10 LAB — MAGNESIUM: Magnesium: 2 mg/dL (ref 1.6–2.3)

## 2021-11-10 LAB — BASIC METABOLIC PANEL
BUN/Creatinine Ratio: 9 — ABNORMAL LOW (ref 10–24)
BUN: 14 mg/dL (ref 8–27)
CO2: 27 mmol/L (ref 20–29)
Calcium: 9.2 mg/dL (ref 8.6–10.2)
Chloride: 103 mmol/L (ref 96–106)
Creatinine, Ser: 1.57 mg/dL — ABNORMAL HIGH (ref 0.76–1.27)
Glucose: 72 mg/dL (ref 70–99)
Potassium: 4.1 mmol/L (ref 3.5–5.2)
Sodium: 140 mmol/L (ref 134–144)
eGFR: 49 mL/min/{1.73_m2} — ABNORMAL LOW (ref >59)

## 2021-11-10 LAB — BRAIN NATRIURETIC PEPTIDE: BNP: 321.5 pg/mL — ABNORMAL HIGH (ref 0.0–100.0)

## 2021-11-11 ENCOUNTER — Ambulatory Visit (INDEPENDENT_AMBULATORY_CARE_PROVIDER_SITE_OTHER): Payer: Medicare Other | Admitting: Cardiovascular Disease

## 2021-11-11 ENCOUNTER — Encounter: Payer: Self-pay | Admitting: Cardiovascular Disease

## 2021-11-11 VITALS — BP 100/68 | HR 62 | Ht 70.0 in | Wt 185.0 lb

## 2021-11-11 DIAGNOSIS — E785 Hyperlipidemia, unspecified: Secondary | ICD-10-CM

## 2021-11-11 DIAGNOSIS — B20 Human immunodeficiency virus [HIV] disease: Secondary | ICD-10-CM

## 2021-11-11 DIAGNOSIS — I5042 Chronic combined systolic (congestive) and diastolic (congestive) heart failure: Secondary | ICD-10-CM | POA: Diagnosis not present

## 2021-11-11 DIAGNOSIS — I2510 Atherosclerotic heart disease of native coronary artery without angina pectoris: Secondary | ICD-10-CM | POA: Diagnosis not present

## 2021-11-11 DIAGNOSIS — Z95810 Presence of automatic (implantable) cardiac defibrillator: Secondary | ICD-10-CM | POA: Diagnosis not present

## 2021-11-11 DIAGNOSIS — I472 Ventricular tachycardia, unspecified: Secondary | ICD-10-CM | POA: Diagnosis not present

## 2021-11-11 DIAGNOSIS — I10 Essential (primary) hypertension: Secondary | ICD-10-CM

## 2021-11-11 MED ORDER — METOPROLOL SUCCINATE ER 25 MG PO TB24: 37.5000 mg | ORAL_TABLET | Freq: Every day | ORAL | 3 refills | Status: DC

## 2021-11-11 NOTE — Patient Instructions (Addendum)
Medication Instructions:  ?INCREASE the Metoprolol Succinate to 37.5 mg once daily in the morning ? ?Take the Losartan and Spironolactone in the evening ? ?*If you need a refill on your cardiac medications before your next appointment, please call your pharmacy* ? ? ?Lab Work: ?None ordered ?If you have labs (blood work) drawn today and your tests are completely normal, you will receive your results only by: ?MyChart Message (if you have MyChart) OR ?A paper copy in the mail ?If you have any lab test that is abnormal or we need to change your treatment, we will call you to review the results. ? ? ?Testing/Procedures: ?None ordered ? ? ?Follow up in 12 months with Dr. Sallyanne Kuster ?

## 2021-11-11 NOTE — Progress Notes (Signed)
? ?Cardiology office note  ? ? ?Date:  11/11/2021  ? ?ID:  Richard [name], DOB Apr 15, 1956, MRN [id number] ? ?PCP:  Harvest Forest, MD  ?Cardiologist:  Nicki Guadalajara, MD ; Agnes Brightbill (device) ?Electrophysiologist:  None  ? ?Evaluation Performed:  Follow-Up Visit ? ?Chief Complaint:  ICD therapy for VT ? ?History of Present Illness:   ? ?Richard [name] is a 66 y.o. male with with severe ischemic cardiomyopathy following an extensive LAD artery distribution myocardial infarction, well compensated chronic systolic and diastolic heart failure, roughly 7 years following implantation of a single-chamber St. Jude defibrillator for primary prevention. ? ?We asked him to come in today after he had appropriate therapy from his defibrillator for sustained ventricular tachycardia.  He was walking on his treadmill at 2.2 miles an hour.  On a typical week he will do this for 1 hour, 3 days a week.  He will do this without any cardiovascular complaints.  On 11/05/2021 he was actually putting in 1/4-day a week of exercise.  He did not have chest pain or shortness of breath while exercising, but experienced sudden weakness and fell to his knees.  He recovered almost immediately.  He did not experience full syncope. ? ?He had not noticed any recent weight changes, had not eaten extra salt rich foods and denied shortness of breath with usual activity.  He has been compliant with all his medications, including his metoprolol we had labs checked and his electrolytes were within normal range.  His BNP was a little high at 321, but we do not have any other values to compare to (with the exception of a very remote normal BNP in 2018).  TSH is normal. ? ?His electrocardiogram is similar to previous tracings and shows normal sinus rhythm right superior axis and poor anterior R wave progression consistent with an old anterior infarction.  He has subtle T wave inversion in leads V4-V6.  The QTc is in normal range at 391 ms. ? ?Device  interrogation shows sudden onset of sustained monomorphic ventricular tachycardia with a cycle length of 255 ms (235 bpm).  This was appropriately detected and successfully treated with a single burst of ATP.  Following return to normal rhythm, frequent PVCs are seen.  He has not had any arrhythmic events since then. ? ?Device function is normal, estimated battery longevity is 3.3 years.  All lead parameters are excellent.  He never requires ventricular pacing. ?  ?He previously had 1 successful defibrillator shock for ventricular fibrillation in February 2018.  At that time he had been out of his beta-blocker for about a week.  He has not had frequent episodes of nonsustained VT. ? ?Corvue has been up and down and does not show any recent major deflection to suggest fluid overload.  He weighs 3 pounds less today than he did in January.  In this particular patient, the thoracic impedance has never proven to be really helpful as an indicator of fluid overload/heart failure exacerbation. ? ?He is only able to tolerate low doses of heart failure medications due to low blood pressure.  He reports that at home his systolic blood pressure is usually in the 98-102 range.  He has occasional mild dizziness with standing.  He is only taking metoprolol succinate 25 mg once daily, losartan half of a 25 mg tablet once daily and take spironolactone 12.5 mg 3 days a week. ? ?He showed me a couple of spots of pruritic hyperpigmented rash on the back of his  right calf.  They have a slightly scaly periphery and I think they represent ringworm. ? ?His most recent nuclear stress test in 2015 showed extensive scar without ischemia and the left ventricular ejection fraction of 27%.His most recent echocardiogram in May 2019 showed an EF of 25-30% with grade 2 diastolic dysfunction.  He received an appropriate shock for ventricular fibrillation on September 30, 2016, after he been out of his metoprolol for about a week.  He has not had any  ventricular events since then. ? ?Lipid profile usually checked at the [facility].  He is also under treatment for HIV there and has undetectable viral load. ? ?He is a disabled Investment banker, operational. ? ?Past Medical History:  ?Diagnosis Date  ? Coronary artery disease   ? a. cath 04/02/2014 3v dx 80-90% in D1, 80-90% mid LAD, 95% apical LAD, 95% distal inferior LV branch of LCx, total occlusion of prox RCA  b). Cath 04/03/2014 DES to mid LAD, DES x3 to prox D1, DAPT indefinitely. LCx lesion untreated  ? HIV (human immunodeficiency virus infection) (HCC)   ? Hypertension   ? Ischemic cardiomyopathy 2015  ? EF 20-25 percent by echo in 04/2014, s/p [address] Jude [place] model [id number] serial number [id number]  ? Stroke [facility])   ? ?Past Surgical History:  ?Procedure Laterality Date  ? CARDIAC CATHETERIZATION  04/02/2014  ? DR Tresa Endo  ? ESOPHAGOGASTRODUODENOSCOPY (EGD) WITH PROPOFOL N/A 09/08/2018  ? Procedure: ESOPHAGOGASTRODUODENOSCOPY (EGD) WITH PROPOFOL;  Surgeon: Jeani Hawking, MD;  Location: [facility] ENDOSCOPY;  Service: Endoscopy;  Laterality: N/A;  ? IMPLANTABLE CARDIOVERTER DEFIBRILLATOR IMPLANT N/A 09/10/2014  ? Procedure: IMPLANTABLE CARDIOVERTER DEFIBRILLATOR IMPLANT;  Surgeon: Thurmon Fair, MD;  [facility] [place] model [id number] serial number [id number]  ? LEFT AND RIGHT HEART CATHETERIZATION WITH CORONARY ANGIOGRAM N/A 04/02/2014  ? Procedure: LEFT AND RIGHT HEART CATHETERIZATION WITH CORONARY ANGIOGRAM;  Surgeon: Lennette Bihari, MD;  Location: [facility] CATH LAB;  Service: Cardiovascular;  Laterality: N/A;  ? MEDIAL COLLATERAL LIGAMENT REPAIR, KNEE Right 08/25/2015  ? Procedure: RIGHT THUMB RADICAL COLLATERAL LIGAMENT REPAIR;  Surgeon: Dairl Ponder, MD;  Location: [facility] OR;  Service: Orthopedics;  Laterality: Right;  ? PERCUTANEOUS CORONARY STENT INTERVENTION (PCI-S) N/A 04/03/2014  ? Procedure: PERCUTANEOUS CORONARY STENT INTERVENTION (PCI-S);  Surgeon: Peter M Swaziland, MD;  Location: [facility] CATH LAB;  Service: Cardiovascular;   Laterality: N/A;  ? SAVORY DILATION N/A 09/08/2018  ? Procedure: SAVORY DILATION;  Surgeon: Jeani Hawking, MD;  Location: [facility] ENDOSCOPY;  Service: Endoscopy;  Laterality: N/A;  ?  ? ?Current Meds  ?Medication Sig  ? abacavir-dolutegravir-lamiVUDine (TRIUMEQ) 600-50-300 MG tablet Take 1 tablet by mouth daily.  ? aspirin 81 MG chewable tablet Chew 81 mg by mouth daily.  ? atorvastatin (LIPITOR) 40 MG tablet Take 1 tablet by mouth once daily  ? Azelaic Acid 15 % cream azelaic acid 15 % topical gel ? APPLY A SMALL AMOUNT ON FACE AT NIGHT TIME TWICE A WEEK  ? carboxymethylcellulose (REFRESH PLUS) 0.5 % SOLN INSTILL 1 DROP IN BOTH EYES FOUR TIMES A DAY  ? cetirizine (ZYRTEC) 10 MG tablet Take 10 mg by mouth daily.  ? dolutegravir-lamiVUDine (DOVATO) 50-300 MG tablet Take 1 tablet by mouth daily.  ? DULoxetine (CYMBALTA) 30 MG capsule Take 30 mg by mouth 2 (two) times daily.  ? ezetimibe (ZETIA) 10 MG tablet Take 1 tablet (10 mg total) by mouth daily.  ? famotidine (PEPCID) 20 MG tablet famotidine 20 mg tablet ? Take 1 tablet by mouth  once daily  ? fluticasone (FLONASE) 50 MCG/ACT nasal spray Place 2 sprays into both nostrils daily as needed for rhinitis.  ? Lactobacillus (ACIDOPHILUS/PECTIN PO) Take 1 capsule by mouth daily.  ? losartan (COZAAR) 25 MG tablet Take 0.5 tablets (12.5 mg total) by mouth daily.  ? Multiple Vitamins-Minerals (CENTRUM SILVER PO) Take 2 tablets by mouth daily.  ? Omega-3 Fatty Acids (FISH OIL) 1000 MG CAPS Take by mouth.  ? oxybutynin (DITROPAN) 5 MG tablet Take 5 mg by mouth in the morning and at bedtime.  ? pantoprazole (PROTONIX) 40 MG tablet Take 1 tablet (40 mg total) by mouth daily as needed. (Patient taking differently: Take 40 mg by mouth daily.)  ? Polyvinyl Alcohol-Povidone PF 1.4-0.6 % SOLN Place 1 drop into both eyes 6 (six) times daily.  ? risperiDONE (RISPERDAL) 1 MG tablet Take 1.5 mg by mouth at bedtime.  ? spironolactone (ALDACTONE) 25 MG tablet TAKE ONE-HALF TABLET (12.5 MG )  EVERY THIRD DAY.  ? [DISCONTINUED] metoprolol succinate (TOPROL-XL) 25 MG 24 hr tablet metoprolol succinate ER 25 mg tablet,extended release 24 hr ? TAKE 1 TABLET BY MOUTH ONCE DAILY FOR 90 DAYS  ?  ? ?Allergi

## 2021-11-12 ENCOUNTER — Telehealth: Payer: Self-pay | Admitting: Cardiovascular Disease

## 2021-11-12 NOTE — Telephone Encounter (Signed)
New Message: ? ? ? ? ? ?Patient said he saw Dr C yesterday and he changed the dose of his Metoprolol. He lost the paper where he had written down the dose. He needs to know the correct dose please. ?

## 2021-11-12 NOTE — Telephone Encounter (Signed)
Called pt and gave him the correct doses. He thanked me for calling him.  ?

## 2021-11-30 ENCOUNTER — Ambulatory Visit: Payer: Medicare Other | Admitting: General Practice

## 2021-12-28 ENCOUNTER — Telehealth: Payer: Self-pay | Admitting: Cardiovascular Disease

## 2021-12-28 NOTE — Telephone Encounter (Signed)
Pt c/o BP issue: STAT if pt c/o blurred vision, one-sided weakness or slurred speech  1. What are your last 5 BP readings?  12/28/21 93/67 AM 114/82 Now   2. Are you having any other symptoms (ex. Dizziness, headache, blurred vision, passed out)? No   3. What is your BP issue? Patient is calling stating his BP has been running in the 90's for the past 3-4 days before meds.

## 2021-12-28 NOTE — Telephone Encounter (Signed)
LMTCB

## 2021-12-29 ENCOUNTER — Telehealth: Payer: Self-pay | Admitting: Cardiovascular Disease

## 2021-12-29 NOTE — Telephone Encounter (Signed)
Matter handled in another telephone note. See chart.

## 2021-12-29 NOTE — Telephone Encounter (Signed)
Called pt, no answer at this time. Left message for him to return the call.

## 2021-12-29 NOTE — Telephone Encounter (Signed)
Pt returning nurses call from yesterday regarding Hypotension. Please advise

## 2021-12-29 NOTE — Telephone Encounter (Signed)
Patient returned call

## 2021-12-29 NOTE — Telephone Encounter (Signed)
Spoke to pt. Gave him Dr. Lurline Del recommendations. Pt advised to reach out to [place] if he starts to have symptoms. He verbalized understanding, no further questions at this time.

## 2021-12-29 NOTE — Telephone Encounter (Signed)
Thanks. If no symptoms of hypotension, please have him continue the same medications.

## 2021-12-29 NOTE — Telephone Encounter (Signed)
Spoke with pt he states his blood pressure today was 106/78 HR 53, pt denies any symptoms.  Past readings  5/22 93/76 ; 114/80; After medications 100/76 5/21 98/81 5/20 92/77 5/19 84/68 Pt denies symptoms with all readings. Pt states he has been taking all the medications as prescribed. "Taking a pill and a half helped for aware but it did not seen stable." Will get message to Dr. Loletha Grayer for review

## 2022-01-05 DIAGNOSIS — R5383 Other fatigue: Secondary | ICD-10-CM | POA: Diagnosis not present

## 2022-01-05 DIAGNOSIS — R7303 Prediabetes: Secondary | ICD-10-CM | POA: Diagnosis not present

## 2022-01-05 DIAGNOSIS — Z0001 Encounter for general adult medical examination with abnormal findings: Secondary | ICD-10-CM | POA: Diagnosis not present

## 2022-01-05 DIAGNOSIS — D519 Vitamin B12 deficiency anemia, unspecified: Secondary | ICD-10-CM | POA: Diagnosis not present

## 2022-01-05 DIAGNOSIS — Z136 Encounter for screening for cardiovascular disorders: Secondary | ICD-10-CM | POA: Diagnosis not present

## 2022-01-07 DIAGNOSIS — I5022 Chronic systolic (congestive) heart failure: Secondary | ICD-10-CM | POA: Diagnosis not present

## 2022-01-07 DIAGNOSIS — H6123 Impacted cerumen, bilateral: Secondary | ICD-10-CM | POA: Diagnosis not present

## 2022-01-07 DIAGNOSIS — Z0001 Encounter for general adult medical examination with abnormal findings: Secondary | ICD-10-CM | POA: Diagnosis not present

## 2022-01-07 DIAGNOSIS — D696 Thrombocytopenia, unspecified: Secondary | ICD-10-CM | POA: Diagnosis not present

## 2022-01-07 DIAGNOSIS — N1831 Chronic kidney disease, stage 3a: Secondary | ICD-10-CM | POA: Diagnosis not present

## 2022-01-07 DIAGNOSIS — T781XXD Other adverse food reactions, not elsewhere classified, subsequent encounter: Secondary | ICD-10-CM | POA: Diagnosis not present

## 2022-01-07 DIAGNOSIS — R7303 Prediabetes: Secondary | ICD-10-CM | POA: Diagnosis not present

## 2022-01-07 DIAGNOSIS — R5383 Other fatigue: Secondary | ICD-10-CM | POA: Diagnosis not present

## 2022-01-07 DIAGNOSIS — Z23 Encounter for immunization: Secondary | ICD-10-CM | POA: Diagnosis not present

## 2022-01-07 DIAGNOSIS — Z1211 Encounter for screening for malignant neoplasm of colon: Secondary | ICD-10-CM | POA: Diagnosis not present

## 2022-01-07 DIAGNOSIS — I10 Essential (primary) hypertension: Secondary | ICD-10-CM | POA: Diagnosis not present

## 2022-01-08 ENCOUNTER — Ambulatory Visit (INDEPENDENT_AMBULATORY_CARE_PROVIDER_SITE_OTHER): Payer: Medicare Other

## 2022-01-08 DIAGNOSIS — I472 Ventricular tachycardia, unspecified: Secondary | ICD-10-CM | POA: Diagnosis not present

## 2022-01-08 LAB — CUP PACEART REMOTE DEVICE CHECK
Battery Remaining Longevity: 38 mo
Battery Remaining Percentage: 35 %
Battery Voltage: 2.95 V
Brady Statistic RV Percent Paced: 1 %
Date Time Interrogation Session: 20230602061451
HighPow Impedance: 59 Ohm
HighPow Impedance: 59 Ohm
Implantable Lead Implant Date: 20160202
Implantable Lead Location: 753860
Implantable Pulse Generator Implant Date: 20160202
Lead Channel Impedance Value: 350 Ohm
Lead Channel Pacing Threshold Amplitude: 1 V
Lead Channel Pacing Threshold Pulse Width: 0.5 ms
Lead Channel Sensing Intrinsic Amplitude: 9.7 mV
Lead Channel Setting Pacing Amplitude: 2.5 V
Lead Channel Setting Pacing Pulse Width: 0.5 ms
Lead Channel Setting Sensing Sensitivity: 0.5 mV
Pulse Gen Serial Number: 7229147

## 2022-01-14 NOTE — Progress Notes (Signed)
Remote ICD transmission.   

## 2022-01-19 DIAGNOSIS — T753XXA Motion sickness, initial encounter: Secondary | ICD-10-CM | POA: Diagnosis not present

## 2022-02-17 ENCOUNTER — Encounter: Payer: Self-pay | Admitting: Physician Assistant

## 2022-03-08 DIAGNOSIS — R1310 Dysphagia, unspecified: Secondary | ICD-10-CM | POA: Diagnosis not present

## 2022-03-08 DIAGNOSIS — Z20822 Contact with and (suspected) exposure to covid-19: Secondary | ICD-10-CM | POA: Diagnosis not present

## 2022-03-08 DIAGNOSIS — I10 Essential (primary) hypertension: Secondary | ICD-10-CM | POA: Diagnosis not present

## 2022-03-08 DIAGNOSIS — L819 Disorder of pigmentation, unspecified: Secondary | ICD-10-CM | POA: Diagnosis not present

## 2022-03-08 DIAGNOSIS — R5383 Other fatigue: Secondary | ICD-10-CM | POA: Diagnosis not present

## 2022-03-08 DIAGNOSIS — I5022 Chronic systolic (congestive) heart failure: Secondary | ICD-10-CM | POA: Diagnosis not present

## 2022-03-08 DIAGNOSIS — D696 Thrombocytopenia, unspecified: Secondary | ICD-10-CM | POA: Diagnosis not present

## 2022-03-08 DIAGNOSIS — E785 Hyperlipidemia, unspecified: Secondary | ICD-10-CM | POA: Diagnosis not present

## 2022-03-08 DIAGNOSIS — N1831 Chronic kidney disease, stage 3a: Secondary | ICD-10-CM | POA: Diagnosis not present

## 2022-03-10 DIAGNOSIS — U071 COVID-19: Secondary | ICD-10-CM | POA: Diagnosis not present

## 2022-03-10 DIAGNOSIS — R059 Cough, unspecified: Secondary | ICD-10-CM | POA: Diagnosis not present

## 2022-03-17 ENCOUNTER — Ambulatory Visit: Payer: Medicare Other | Admitting: Physician Assistant

## 2022-03-18 ENCOUNTER — Other Ambulatory Visit: Payer: Self-pay | Admitting: Internal Medicine

## 2022-03-18 DIAGNOSIS — R1900 Intra-abdominal and pelvic swelling, mass and lump, unspecified site: Secondary | ICD-10-CM

## 2022-04-09 ENCOUNTER — Ambulatory Visit (INDEPENDENT_AMBULATORY_CARE_PROVIDER_SITE_OTHER): Payer: Medicare Other

## 2022-04-09 DIAGNOSIS — Z95810 Presence of automatic (implantable) cardiac defibrillator: Secondary | ICD-10-CM

## 2022-04-12 LAB — CUP PACEART REMOTE DEVICE CHECK
Battery Remaining Longevity: 36 mo
Battery Remaining Percentage: 33 %
Battery Voltage: 2.95 V
Brady Statistic RV Percent Paced: 1 %
Date Time Interrogation Session: 20230901044605
HighPow Impedance: 57 Ohm
HighPow Impedance: 57 Ohm
Implantable Lead Implant Date: 20160202
Implantable Lead Location: 753860
Implantable Pulse Generator Implant Date: 20160202
Lead Channel Impedance Value: 360 Ohm
Lead Channel Pacing Threshold Amplitude: 1 V
Lead Channel Pacing Threshold Pulse Width: 0.5 ms
Lead Channel Sensing Intrinsic Amplitude: 10.8 mV
Lead Channel Setting Pacing Amplitude: 2.5 V
Lead Channel Setting Pacing Pulse Width: 0.5 ms
Lead Channel Setting Sensing Sensitivity: 0.5 mV
Pulse Gen Serial Number: 7229147

## 2022-04-16 ENCOUNTER — Ambulatory Visit
Admission: RE | Admit: 2022-04-16 | Discharge: 2022-04-16 | Disposition: A | Discharge status: Home / Self Care | Payer: Medicare Other | Source: Ambulatory Visit | Attending: Internal Medicine | Admitting: Internal Medicine

## 2022-04-16 DIAGNOSIS — R109 Unspecified abdominal pain: Secondary | ICD-10-CM | POA: Diagnosis not present

## 2022-04-16 DIAGNOSIS — K449 Diaphragmatic hernia without obstruction or gangrene: Secondary | ICD-10-CM | POA: Diagnosis not present

## 2022-04-16 DIAGNOSIS — R1900 Intra-abdominal and pelvic swelling, mass and lump, unspecified site: Secondary | ICD-10-CM

## 2022-04-18 NOTE — Progress Notes (Unsigned)
04/21/2022 Richard [name] [id number] 1956/07/19  Referring provider: Audley Hose, MD Primary GI doctor: Dr. Hilarie Fredrickson  ASSESSMENT AND PLAN:   Assessment: 66 y.o. male here for assessment of the following: 1. Esophageal dysphagia   2. Gastroesophageal reflux disease with esophagitis without hemorrhage   3. History of colonic polyps   4. Rectal bleeding    09/08/2018 EGD for dysphagia showed LA grade a reflux esophagitis, 5 cm hiatal hernia, benign-appearing esophageal stenosis dilated to 18 mm, normal stomach and duodenum no specimens collected. 01/2013 colonoscopy at [facility] for average risk screening showed 2 to 3 mm flat polyp, lymphoid aggregate recall 10 years. 09/2021 unremarkable MBS  66 year old male with multiple comorbidites including HIV, ischemic cardiomyopathy with ejection fraction 30 to 35% with AICD, CVA 2003, mild to moderate MR, TR presents for dysphagia, melena, hematemesis however on talking with the patient, he had 1 episode of dark stools that corresponds to pepto, his dysphagia is not consistent, and he has not had any hematochezia or hematemesis.  FOBT negative in the office Patient is very high risk for procedures would have to be done in the hospital.  Plan: Would not suggest screening colonoscopy, will check iron/ferritin to evaluate for iron def, if positive will schedule EGD/Colon in the hospital.  Will schedule barium swallow, sounds more like GERd possibly secondary to known hiatal hernia intermittent dysphagia, if it shows a stricture will plan for EGD with possible colon at the hospital.  Lifestyle changes discussed, avoid NSAIDS, ETOH Will increase pepcid to twice daily, emphasizing before food.  Orders Placed This Encounter  Procedures   DG ESOPHAGUS W DOUBLE CM (HD)   CBC with Differential/Platelet   Comprehensive metabolic panel   IBC + Ferritin    Meds ordered this encounter  Medications   famotidine  (PEPCID) 40 MG tablet    Sig: Take 1 tablet (40 mg total) by mouth 2 (two) times daily.    Dispense:  60 tablet    Refill:  2    Patient Care Team: Audley Hose, MD as PCP - General (Internal Medicine) Troy Sine, MD as PCP - Cardiology (Cardiology)  HISTORY OF PRESENT ILLNESS: 66 y.o. male with a past medical history of systolic heart failure EF 30 to 35% 06/2021 echo a/p AICD, mild to moderate MR and TR, grade 2 diastolic dysfunction, ischemic cardiomyopathy with CAD post PCI 2000, patient HIV, CVA 2003, hypertension and others listed below presents for evaluation of dysphagia, hematemesis   Patient currently follows with GI clinic [facility] due to his symptoms was suggested to undergo endoscopy and colonoscopy however due to his AICD and systolic heart failure patient suggested this to be done at [facility], is here for evaluation.  Previously seen by Dr. Benson Norway 2020 for hiatal hernia and esophageal stenosis, resolved symptoms until a few months ago.  Has soreness and mucus in his throat when he wakes up.  He states for a while he would have food getting caught in his mid throat and then he would have to cough/vomit it up. Worse with chicken/salad dressing. Denies odynophagia. He denies issues with liquids.  States one time at beginning of the food coughed up a "speck" of BRB in mucus.  States he had one episode of black stool, loose a month ago, nothing since that time. States possibly associated with pepto.  States pantoprazole causes gas and he switch to famotidine, once a day and states this is  helping.  He has BM once a day or every other day.  Denies AB pain.  Denies nausea, vomiting.  No NSAIDS, no ETOH.  Slightly difficult historian.   04/16/2022 CT AB and pelvis without contrast Small 1.5 cm mass in the left hemiabdomen anterior to the left psoas muscle was not seen on prior CT or MRI. This has a similar morphology as the previous nodule in the left  anterior abdomen which is no longer visualized. Follow-up in 3-6 months with CT or MRI with IV contrast is recommended. Moderate hiatal hernia. Aortic Atherosclerosis (ICD10-I70.0). 09/08/2018 EGD for dysphagia showed LA grade a reflux esophagitis, 5 cm hiatal hernia, benign-appearing esophageal stenosis dilated to 18 mm, normal stomach and duodenum no specimens collected. 09/2021 modified barium swallow showed no aspiration There is residue in the piriform and vallecular sinuses with thin liquid consistency. 2018 pH/motility  eso mano: slight hypotensive les and normal esophageal body ph: gastric acid: ph< 4 at 88.7% of the time eso acid: abnormal in recumbent but overall acid exposure normal total number reflux events: normal. symptom association: there was no association between recorded  heartburn/regurgitation and acid or nonacid reflux. 01/2013 colonoscopy at [facility] for average risk screening showed 2 to 3 mm flat polyp, lymphoid aggregate recall 10 years.  He  reports that he has never smoked. He has never used smokeless tobacco. He reports that he does not drink alcohol and does not use drugs.  Current Medications:    Current Outpatient Medications (Cardiovascular):    atorvastatin (LIPITOR) 40 MG tablet, Take 1 tablet by mouth once daily   ezetimibe (ZETIA) 10 MG tablet, Take 1 tablet (10 mg total) by mouth daily.   losartan (COZAAR) 25 MG tablet, Take 0.5 tablets (12.5 mg total) by mouth daily.   metoprolol succinate (TOPROL-XL) 25 MG 24 hr tablet, Take 1.5 tablets (37.5 mg total) by mouth daily with breakfast.   spironolactone (ALDACTONE) 25 MG tablet, TAKE ONE-HALF TABLET (12.5 MG ) EVERY THIRD DAY.  Current Outpatient Medications (Respiratory):    cetirizine (ZYRTEC) 10 MG tablet, Take 10 mg by mouth daily.   fluticasone (FLONASE) 50 MCG/ACT nasal spray, Place 2 sprays into both nostrils daily as needed for rhinitis. (Patient not taking: Reported on  04/21/2022)  Current Outpatient Medications (Analgesics):    aspirin 81 MG chewable tablet, Chew 81 mg by mouth daily.   Current Outpatient Medications (Other):    abacavir-dolutegravir-lamiVUDine (TRIUMEQ) 600-50-300 MG tablet, Take 1 tablet by mouth daily.   Azelaic Acid 15 % cream, azelaic acid 15 % topical gel  APPLY A SMALL AMOUNT ON FACE AT NIGHT TIME TWICE A WEEK   carboxymethylcellulose (REFRESH PLUS) 0.5 % SOLN, INSTILL 1 DROP IN BOTH EYES FOUR TIMES A DAY   dolutegravir-lamiVUDine (DOVATO) 50-300 MG tablet, Take 1 tablet by mouth daily.   DULoxetine (CYMBALTA) 30 MG capsule, Take 30 mg by mouth 2 (two) times daily.   oxybutynin (DITROPAN) 5 MG tablet, Take 5 mg by mouth in the morning and at bedtime.   pantoprazole (PROTONIX) 40 MG tablet, Take 1 tablet (40 mg total) by mouth daily as needed. (Patient taking differently: Take 40 mg by mouth daily.)   Polyvinyl Alcohol-Povidone PF 1.4-0.6 % SOLN, Place 1 drop into both eyes 6 (six) times daily.   risperiDONE (RISPERDAL) 1 MG tablet, Take 1.5 mg by mouth at bedtime.   famotidine (PEPCID) 40 MG tablet, Take 1 tablet (40 mg total) by mouth 2 (two) times daily.   Lactobacillus (  ACIDOPHILUS/PECTIN PO), Take 1 capsule by mouth daily. (Patient not taking: Reported on 04/21/2022)   Multiple Vitamins-Minerals (CENTRUM SILVER PO), Take 2 tablets by mouth daily. (Patient not taking: Reported on 04/21/2022)   Omega-3 Fatty Acids (FISH OIL) 1000 MG CAPS, Take by mouth. (Patient not taking: Reported on 04/21/2022)  Medical History:  Past Medical History:  Diagnosis Date   Coronary artery disease    a. cath 04/02/2014 3v dx 80-90% in D1, 80-90% mid LAD, 95% apical LAD, 95% distal inferior LV branch of LCx, total occlusion of prox RCA  b). Cath 04/03/2014 DES to mid LAD, DES x3 to prox D1, DAPT indefinitely. LCx lesion untreated   GERD (gastroesophageal reflux disease)    HIV (human immunodeficiency virus infection) ([place])    Hypertension    Ischemic  cardiomyopathy 08/09/2013   EF 20-25 percent by echo in 04/2014, s/p St. Jude Fortify [place] model [id number] serial number [id number]   Stroke [facility])    Allergies:  Allergies  Allergen Reactions   Iodine    Lisinopril Cough   Ioxaglate Hives   Ivp Dye [Iodinated Contrast Media] Hives     Surgical History:  He  has a past surgical history that includes Cardiac catheterization (04/02/2014); left and right heart catheterization with coronary angiogram (N/A, 04/02/2014); percutaneous coronary stent intervention (pci-s) (N/A, 04/03/2014); implantable cardioverter defibrillator implant (N/A, 09/10/2014); Medial collateral ligament repair, knee (Right, 08/25/2015); Esophagogastroduodenoscopy (egd) with propofol (N/A, 09/08/2018); and Savory dilation (N/A, 09/08/2018). Family History:  His family history includes Heart disease in his mother.  REVIEW OF SYSTEMS  : All other systems reviewed and negative except where noted in the History of Present Illness.  PHYSICAL EXAM: BP 92/62   Pulse 73   Ht '5\' 10"'$  (1.778 m)   Wt 182 lb (82.6 kg)   SpO2 98%   BMI 26.11 kg/m  General:   Pleasant, well developed male in no acute distress Head:   Normocephalic and atraumatic. Eyes:  sclerae anicteric,conjunctive pink  Heart:   regular rate and rhythm, AICD left upper chest Pulm:  Clear anteriorly; no wheezing Abdomen:   Soft, Obese AB, Active bowel sounds. No tenderness . , No organomegaly appreciated. Rectal: Normal external rectal exam, normal rectal tone, no internal hemorrhoids appreciated, no masses, non tender, brown stool, hemoccult Negative Extremities:  Without edema. Msk: Symmetrical without gross deformities. Peripheral pulses intact.  Neurologic:  Alert and  oriented x4;  No focal deficits.  Skin:   Dry and intact without significant lesions or rashes. Psychiatric:  Cooperative. Normal mood and affect.    Vladimir Crofts, PA-C 10:01 AM

## 2022-04-21 ENCOUNTER — Other Ambulatory Visit (INDEPENDENT_AMBULATORY_CARE_PROVIDER_SITE_OTHER): Payer: Medicare Other

## 2022-04-21 ENCOUNTER — Encounter: Payer: Self-pay | Admitting: Physician Assistant

## 2022-04-21 ENCOUNTER — Ambulatory Visit: Payer: Medicare Other | Admitting: Physician Assistant

## 2022-04-21 VITALS — BP 92/62 | HR 73 | Ht 70.0 in | Wt 182.0 lb

## 2022-04-21 DIAGNOSIS — K625 Hemorrhage of anus and rectum: Secondary | ICD-10-CM

## 2022-04-21 DIAGNOSIS — R1319 Other dysphagia: Secondary | ICD-10-CM

## 2022-04-21 DIAGNOSIS — Z86010 Personal history of colonic polyps: Secondary | ICD-10-CM

## 2022-04-21 DIAGNOSIS — R9389 Abnormal findings on diagnostic imaging of other specified body structures: Secondary | ICD-10-CM | POA: Diagnosis not present

## 2022-04-21 DIAGNOSIS — K2100 Gastro-esophageal reflux disease with esophagitis, without bleeding: Secondary | ICD-10-CM | POA: Diagnosis not present

## 2022-04-21 DIAGNOSIS — R1900 Intra-abdominal and pelvic swelling, mass and lump, unspecified site: Secondary | ICD-10-CM | POA: Diagnosis not present

## 2022-04-21 LAB — COMPREHENSIVE METABOLIC PANEL
ALT: 20 U/L (ref 0–53)
AST: 17 U/L (ref 0–37)
Albumin: 4 g/dL (ref 3.5–5.2)
Alkaline Phosphatase: 54 U/L (ref 39–117)
BUN: 15 mg/dL (ref 6–23)
CO2: 27 mEq/L (ref 19–32)
Calcium: 9.2 mg/dL (ref 8.4–10.5)
Chloride: 103 mEq/L (ref 96–112)
Creatinine, Ser: 1.63 mg/dL — ABNORMAL HIGH (ref 0.40–1.50)
GFR: 44.2 mL/min — ABNORMAL LOW (ref >60.00)
Glucose, Bld: 82 mg/dL (ref 70–99)
Potassium: 4.1 mEq/L (ref 3.5–5.1)
Sodium: 138 mEq/L (ref 135–145)
Total Bilirubin: 1 mg/dL (ref 0.2–1.2)
Total Protein: 7.6 g/dL (ref 6.0–8.3)

## 2022-04-21 LAB — CBC WITH DIFFERENTIAL/PLATELET
Basophils Absolute: 0 10*3/uL (ref 0.0–0.1)
Basophils Relative: 0.8 % (ref 0.0–3.0)
Eosinophils Absolute: 0.3 10*3/uL (ref 0.0–0.7)
Eosinophils Relative: 7.7 % — ABNORMAL HIGH (ref 0.0–5.0)
HCT: 41.7 % (ref 39.0–52.0)
Hemoglobin: 13.7 g/dL (ref 13.0–17.0)
Lymphocytes Relative: 41.3 % (ref 12.0–46.0)
Lymphs Abs: 1.7 10*3/uL (ref 0.7–4.0)
MCHC: 32.8 g/dL (ref 30.0–36.0)
MCV: 85.6 fl (ref 78.0–100.0)
Monocytes Absolute: 0.5 10*3/uL (ref 0.1–1.0)
Monocytes Relative: 11.8 % (ref 3.0–12.0)
Neutro Abs: 1.6 10*3/uL (ref 1.4–7.7)
Neutrophils Relative %: 38.4 % — ABNORMAL LOW (ref 43.0–77.0)
Platelets: 128 10*3/uL — ABNORMAL LOW (ref 150.0–400.0)
RBC: 4.87 Mil/uL (ref 4.22–5.81)
RDW: 16.1 % — ABNORMAL HIGH (ref 11.5–15.5)
WBC: 4.1 10*3/uL (ref 4.0–10.5)

## 2022-04-21 LAB — IBC + FERRITIN
Ferritin: 84.4 ng/mL (ref 22.0–322.0)
Iron: 120 ug/dL (ref 42–165)
Saturation Ratios: 37.9 % (ref 20.0–50.0)
TIBC: 316.4 ug/dL (ref 250.0–450.0)
Transferrin: 226 mg/dL (ref 212.0–360.0)

## 2022-04-21 MED ORDER — FAMOTIDINE 40 MG PO TABS: 40.0000 mg | ORAL_TABLET | Freq: Two times a day (BID) | ORAL | 2 refills | Status: DC

## 2022-04-21 NOTE — Patient Instructions (Addendum)
_______________________________________________________  If you are age 64 or older, your body mass index should be between 23-30. Your Body mass index is 26.11 kg/m. If this is out of the aforementioned range listed, please consider follow up with your Primary Care Provider.  If you are age 66 or younger, your body mass index should be between 19-25. Your Body mass index is 26.11 kg/m. If this is out of the aformentioned range listed, please consider follow up with your Primary Care Provider.   ________________________________________________________  The [facility] providers would like to encourage you to use [facility] to communicate with providers for non-urgent requests or questions.  Due to long hold times on the telephone, sending your provider a message by [facility] may be a faster and more efficient way to get a response.  Please allow 48 business hours for a response.  Please remember that this is for non-urgent requests.  _______________________________________________________    Your provider has requested that you go to the basement level for lab work before leaving today. Press "B" on the elevator. The lab is located at the first door on the left as you exit the elevator.  Continue famotidine once a day Please take this medication 30 minutes to 1 hour before meals- this makes it more effective.  Avoid spicy and acidic foods Avoid fatty foods Limit your intake of coffee, tea, alcohol, and carbonated drinks Work to maintain a healthy weight Keep the head of the bed elevated at least 3 inches with blocks or a wedge pillow if you are having any nighttime symptoms Stay upright for 2 hours after eating Avoid meals and snacks three to four hours before bedtime  You have been scheduled for a Barium Esophogram at [facility] : [facility] (1st floor of the hospital) on 04-28-2022 at 10am. Please arrive 15 minutes prior to your appointment for registration. Make certain not to have anything to  eat or drink 3 hours prior to your test. If you need to reschedule for any reason, please contact radiology at [phone] to do so. __________________________________________________________________ A barium swallow is an examination that concentrates on views of the esophagus. This tends to be a double contrast exam (barium and two liquids which, when combined, create a gas to distend the wall of the oesophagus) or single contrast (non-ionic iodine based). The study is usually tailored to your symptoms so a good history is essential. Attention is paid during the study to the form, structure and configuration of the esophagus, looking for functional disorders (such as aspiration, dysphagia, achalasia, motility and reflux) EXAMINATION You may be asked to change into a gown, depending on the type of swallow being performed. A radiologist and radiographer will perform the procedure. The radiologist will advise you of the type of contrast selected for your procedure and direct you during the exam. You will be asked to stand, sit or lie in several different positions and to hold a small amount of fluid in your mouth before being asked to swallow while the imaging is performed .In some instances you may be asked to swallow barium coated marshmallows to assess the motility of a solid food bolus. The exam can be recorded as a digital or video fluoroscopy procedure. POST PROCEDURE It will take 1-2 days for the barium to pass through your system. To facilitate this, it is important, unless otherwise directed, to increase your fluids for the next 24-48hrs and to resume your normal diet.  This test typically takes about 30 minutes to perform. __________________________________________________________________________________  It  was a pleasure to see you today!  Thank you for trusting me with your gastrointestinal care!

## 2022-04-28 ENCOUNTER — Ambulatory Visit (HOSPITAL_COMMUNITY)
Admission: RE | Admit: 2022-04-28 | Discharge: 2022-04-28 | Disposition: A | Discharge status: Home / Self Care | Payer: Medicare Other | Source: Ambulatory Visit | Attending: Physician Assistant | Admitting: Physician Assistant

## 2022-04-28 DIAGNOSIS — K219 Gastro-esophageal reflux disease without esophagitis: Secondary | ICD-10-CM | POA: Diagnosis not present

## 2022-04-28 DIAGNOSIS — R1319 Other dysphagia: Secondary | ICD-10-CM | POA: Diagnosis not present

## 2022-04-28 NOTE — Progress Notes (Signed)
Remote ICD transmission.   

## 2022-05-04 NOTE — Progress Notes (Signed)
Addendum: Reviewed and agree with assessment and management plan. Jennife Zaucha M, MD  

## 2022-05-06 DIAGNOSIS — R1900 Intra-abdominal and pelvic swelling, mass and lump, unspecified site: Secondary | ICD-10-CM | POA: Diagnosis not present

## 2022-05-07 DIAGNOSIS — W19XXXA Unspecified fall, initial encounter: Secondary | ICD-10-CM | POA: Diagnosis not present

## 2022-05-07 DIAGNOSIS — M1711 Unilateral primary osteoarthritis, right knee: Secondary | ICD-10-CM | POA: Diagnosis not present

## 2022-05-07 DIAGNOSIS — M1712 Unilateral primary osteoarthritis, left knee: Secondary | ICD-10-CM | POA: Diagnosis not present

## 2022-05-10 ENCOUNTER — Other Ambulatory Visit: Payer: Self-pay | Admitting: Internal Medicine

## 2022-05-10 ENCOUNTER — Ambulatory Visit
Admission: RE | Admit: 2022-05-10 | Discharge: 2022-05-10 | Disposition: A | Discharge status: Home / Self Care | Payer: Medicare Other | Source: Ambulatory Visit | Attending: Internal Medicine | Admitting: Internal Medicine

## 2022-05-10 DIAGNOSIS — M1711 Unilateral primary osteoarthritis, right knee: Secondary | ICD-10-CM

## 2022-05-10 DIAGNOSIS — M1712 Unilateral primary osteoarthritis, left knee: Secondary | ICD-10-CM

## 2022-05-14 ENCOUNTER — Other Ambulatory Visit (HOSPITAL_COMMUNITY): Payer: Self-pay | Admitting: General Surgery

## 2022-05-14 ENCOUNTER — Other Ambulatory Visit: Payer: Self-pay | Admitting: General Surgery

## 2022-05-14 DIAGNOSIS — R1900 Intra-abdominal and pelvic swelling, mass and lump, unspecified site: Secondary | ICD-10-CM

## 2022-05-17 DIAGNOSIS — M25562 Pain in left knee: Secondary | ICD-10-CM | POA: Diagnosis not present

## 2022-05-17 NOTE — Progress Notes (Unsigned)
Markus Daft, MD  Donita Brooks D Do not schedule.  I will discuss with ordering physician.   [place]

## 2022-05-19 NOTE — Progress Notes (Signed)
05/20/2022 Richard [name] [id number] 1956/02/01  Referring provider: Audley Hose, MD Primary GI doctor: Dr. Hilarie Fredrickson  ASSESSMENT AND PLAN:   Esophageal dysphagia with GERD Barium swallow showed GERD, no strictures Continue Pepcid BID, can switch to nexium (increase gas with protonix) Diet changes and lifestyle discussed, information given  Hypotension, unspecified hypotension type Rechecked, no symptoms at this time, follow up with cardiology, ER precautions discussed.   History of colonic polyps 01/2013 colonoscopy at [facility] for average risk screening showed 2 to 3 mm flat polyp, lymphoid aggregate recall 10 years. With list of chronic morbid conditions below would likely suggest not proceeding with screening colonoscopy, last visit patient had negative FOBT, patient had normal iron/ferritin without any evidence of anemia, denies melena or hematochezia.   Multiple comorbidites:  including HIV, ischemic cardiomyopathy with ejection fraction 30 to 35% with AICD, unstable angina, CVA 2003, mild to moderate MR, TR Patient is very high risk for procedures would have to be done in the hospital.  Will do follow up 6 months If any anemia or symptoms, will call our office and we will set up for endoscopic evaluation in the hospital   Patient Care Team: Audley Hose, MD as PCP - General (Internal Medicine) Troy Sine, MD as PCP - Cardiology (Cardiology)  HISTORY OF PRESENT ILLNESS: 66 y.o. male with a past medical history of systolic heart failure EF 30 to 35% 06/2021 echo a/p AICD, mild to moderate MR and TR, grade 2 diastolic dysfunction, ischemic cardiomyopathy with CAD post PCI 2000, patient HIV, CVA 2003, hypertension and others listed below presents for evaluation of dysphagia, hematemesis   Patient currently follows [facility] due to his symptoms was suggested to undergo endoscopy and colonoscopy however due to his  AICD and systolic heart failure patient suggested this to be done at [facility], is here for evaluation.  Patient was seen a month ago for dysphagia and possible episode of vomiting blood.  After talking the patient was a speck of blood potentially more oropharyngeal.   Patient had barium swallow which showed reflux without any stenosis or masses.  Patient had gas with Protonix so his Pepcid was increased to twice daily. Iron and ferritin were unremarkable last visit, no IDA. He is very high risk for endoscopic procedures is due for colonoscopy 01/2023. He has BM once a day or every other day. Denies any hematochezia or melena.  Denies AB pain.  Denies nausea, vomiting Had low BP here in the office, has not taken his morning medications, denies dizziness, SOB, CP. [name]  No NSAIDS, no ETOH.  Slightly difficult historian.   05/07/2022 barium swallow showed small sliding hiatal hernia, reflux level of mid thoracic esophagus, no masses or stricture patient was given Pepcid twice daily previously had improvement of symptoms 04/16/2022 CT AB and pelvis without contrast Small 1.5 cm mass in the left hemiabdomen anterior to the left psoas muscle was not seen on prior CT or MRI. This has a similar morphology as the previous nodule in the left anterior abdomen which is no longer visualized. Follow-up in 3-6 months with CT or MRI with IV contrast is recommended. Moderate hiatal hernia. Aortic Atherosclerosis (ICD10-I70.0). 09/08/2018 EGD for dysphagia showed LA grade a reflux esophagitis, 5 cm hiatal hernia, benign-appearing esophageal stenosis dilated to 18 mm, normal stomach and duodenum no specimens collected. 09/2021 modified barium swallow showed no aspiration There is residue in the piriform and vallecular sinuses  with thin liquid consistency. 2018 pH/motility  eso mano: slight hypotensive les and normal esophageal body ph: gastric acid: ph< 4 at 88.7% of the time eso acid: abnormal in  recumbent but overall acid exposure normal total number reflux events: normal. symptom association: there was no association between recorded  heartburn/regurgitation and acid or nonacid reflux. 01/2013 colonoscopy at [facility] for average risk screening showed 2 to 3 mm flat polyp, lymphoid aggregate recall 10 years.  He  reports that he has never smoked. He has never used smokeless tobacco. He reports that he does not drink alcohol and does not use drugs.  Current Medications:    Current Outpatient Medications (Cardiovascular):    atorvastatin (LIPITOR) 40 MG tablet, Take 1 tablet by mouth once daily   ezetimibe (ZETIA) 10 MG tablet, Take 1 tablet (10 mg total) by mouth daily.   losartan (COZAAR) 25 MG tablet, Take 0.5 tablets (12.5 mg total) by mouth daily.   metoprolol succinate (TOPROL-XL) 25 MG 24 hr tablet, Take 1.5 tablets (37.5 mg total) by mouth daily with breakfast.   spironolactone (ALDACTONE) 25 MG tablet, TAKE ONE-HALF TABLET (12.5 MG ) EVERY THIRD DAY.  Current Outpatient Medications (Respiratory):    cetirizine (ZYRTEC) 10 MG tablet, Take 10 mg by mouth daily.   fluticasone (FLONASE) 50 MCG/ACT nasal spray, Place 2 sprays into both nostrils daily as needed for rhinitis.  Current Outpatient Medications (Analgesics):    aspirin 81 MG chewable tablet, Chew 81 mg by mouth daily.   Current Outpatient Medications (Other):    Azelaic Acid 15 % cream, azelaic acid 15 % topical gel  APPLY A SMALL AMOUNT ON FACE AT NIGHT TIME TWICE A WEEK   carboxymethylcellulose (REFRESH PLUS) 0.5 % SOLN, INSTILL 1 DROP IN BOTH EYES FOUR TIMES A DAY   dolutegravir-lamiVUDine (DOVATO) 50-300 MG tablet, Take 1 tablet by mouth daily.   DULoxetine (CYMBALTA) 30 MG capsule, Take 30 mg by mouth 2 (two) times daily.   famotidine (PEPCID) 40 MG tablet, Take 1 tablet (40 mg total) by mouth 2 (two) times daily.   Lactobacillus (ACIDOPHILUS/PECTIN PO), Take 1 capsule by mouth daily.    Multiple Vitamins-Minerals (CENTRUM SILVER PO), Take 2 tablets by mouth daily.   Omega-3 Fatty Acids (FISH OIL) 1000 MG CAPS, Take by mouth.   oxybutynin (DITROPAN) 5 MG tablet, Take 5 mg by mouth in the morning and at bedtime.   pantoprazole (PROTONIX) 40 MG tablet, Take 1 tablet (40 mg total) by mouth daily as needed. (Patient taking differently: Take 40 mg by mouth daily.)   Polyvinyl Alcohol-Povidone PF 1.4-0.6 % SOLN, Place 1 drop into both eyes 6 (six) times daily.   risperiDONE (RISPERDAL) 1 MG tablet, Take 1.5 mg by mouth at bedtime.  Medical History:  Past Medical History:  Diagnosis Date   Coronary artery disease    a. cath 04/02/2014 3v dx 80-90% in D1, 80-90% mid LAD, 95% apical LAD, 95% distal inferior LV branch of LCx, total occlusion of prox RCA  b). Cath 04/03/2014 DES to mid LAD, DES x3 to prox D1, DAPT indefinitely. LCx lesion untreated   GERD (gastroesophageal reflux disease)    HIV (human immunodeficiency virus infection) ([place])    Hypertension    Ischemic cardiomyopathy 08/09/2013   EF 20-25 percent by echo in 04/2014, s/p [employer] Jude Fortify Assura model [id number] serial number [id number]   Stroke [facility])    Allergies:  Allergies  Allergen Reactions   Iodine    Lisinopril Cough  Ioxaglate Hives   Ivp Dye [Iodinated Contrast Media] Hives     Surgical History:  He  has a past surgical history that includes Cardiac catheterization (04/02/2014); left and right heart catheterization with coronary angiogram (N/A, 04/02/2014); percutaneous coronary stent intervention (pci-s) (N/A, 04/03/2014); implantable cardioverter defibrillator implant (N/A, 09/10/2014); Medial collateral ligament repair, knee (Right, 08/25/2015); Esophagogastroduodenoscopy (egd) with propofol (N/A, 09/08/2018); and Savory dilation (N/A, 09/08/2018). Family History:  His family history includes Heart disease in his mother.  REVIEW OF SYSTEMS  : All other systems reviewed and negative except where noted in the  History of Present Illness.  PHYSICAL EXAM: BP (!) 90/50   Pulse 66   Ht '5\' 10"'$  (1.778 m)   Wt 184 lb (83.5 kg)   BMI 26.40 kg/m  General:   Pleasant, well developed male in no acute distress Head:   Normocephalic and atraumatic. Eyes:  sclerae anicteric,conjunctive pink  Heart:   regular rate and rhythm, AICD left upper chest Pulm:  Clear anteriorly; no wheezing Abdomen:   Soft, Obese AB, Active bowel sounds. No tenderness . , No organomegaly appreciated. Rectal: declined this visit Extremities:  Without edema. Msk: Symmetrical without gross deformities. Peripheral pulses intact.  Neurologic:  Alert and  oriented x4;  No focal deficits.  Skin:   Dry and intact without significant lesions or rashes. Psychiatric:  Cooperative. Normal mood and affect.    Vladimir Crofts, PA-C 12:01 PM

## 2022-05-20 ENCOUNTER — Ambulatory Visit: Payer: Medicare Other | Admitting: Physician Assistant

## 2022-05-20 ENCOUNTER — Encounter: Payer: Self-pay | Admitting: Physician Assistant

## 2022-05-20 VITALS — BP 90/50 | HR 66 | Ht 70.0 in | Wt 184.0 lb

## 2022-05-20 DIAGNOSIS — I200 Unstable angina: Secondary | ICD-10-CM | POA: Diagnosis not present

## 2022-05-20 DIAGNOSIS — I5042 Chronic combined systolic (congestive) and diastolic (congestive) heart failure: Secondary | ICD-10-CM | POA: Diagnosis not present

## 2022-05-20 DIAGNOSIS — K2100 Gastro-esophageal reflux disease with esophagitis, without bleeding: Secondary | ICD-10-CM

## 2022-05-20 DIAGNOSIS — I472 Ventricular tachycardia, unspecified: Secondary | ICD-10-CM

## 2022-05-20 DIAGNOSIS — M6281 Muscle weakness (generalized): Secondary | ICD-10-CM | POA: Diagnosis not present

## 2022-05-20 DIAGNOSIS — B20 Human immunodeficiency virus [HIV] disease: Secondary | ICD-10-CM

## 2022-05-20 DIAGNOSIS — Z86010 Personal history of colonic polyps: Secondary | ICD-10-CM | POA: Diagnosis not present

## 2022-05-20 DIAGNOSIS — R1319 Other dysphagia: Secondary | ICD-10-CM

## 2022-05-20 DIAGNOSIS — Z95810 Presence of automatic (implantable) cardiac defibrillator: Secondary | ICD-10-CM | POA: Diagnosis not present

## 2022-05-20 DIAGNOSIS — Z8673 Personal history of transient ischemic attack (TIA), and cerebral infarction without residual deficits: Secondary | ICD-10-CM | POA: Diagnosis not present

## 2022-05-20 DIAGNOSIS — I959 Hypotension, unspecified: Secondary | ICD-10-CM

## 2022-05-20 DIAGNOSIS — M1712 Unilateral primary osteoarthritis, left knee: Secondary | ICD-10-CM | POA: Diagnosis not present

## 2022-05-20 NOTE — Patient Instructions (Addendum)
Please follow up in 6 months. Give [place] a call at [phone] to schedule an appointment.    Take the pepcid twice a day, can take the pantoprazole AS needed, if this causes too much gas or issues can try nexium instead.  Please take this medication 30 minutes to 1 hour before meals- this makes it more effective.  Avoid spicy and acidic foods Avoid fatty foods Limit your intake of coffee, tea, alcohol, and carbonated drinks Work to maintain a healthy weight Keep the head of the bed elevated at least 3 inches with blocks or a wedge pillow if you are having any nighttime symptoms Stay upright for 2 hours after eating Avoid meals and snacks three to four hours before bedtime   If you have any further blood in your stool, any new anemia, please notifity our office and make an appointment so we can set you up in the hospital for a procedure but with your heart issues I think doing a colonoscopy for screening purposes may be more dangerous.    Thank you for choosing me and [facility].  Vicie Mutters, PA-C

## 2022-05-26 DIAGNOSIS — M1712 Unilateral primary osteoarthritis, left knee: Secondary | ICD-10-CM | POA: Diagnosis not present

## 2022-05-26 DIAGNOSIS — M6281 Muscle weakness (generalized): Secondary | ICD-10-CM | POA: Diagnosis not present

## 2022-05-27 NOTE — Progress Notes (Signed)
Addendum: Reviewed and agree with assessment and management plan. Conor Lata M, MD  

## 2022-05-28 DIAGNOSIS — M1712 Unilateral primary osteoarthritis, left knee: Secondary | ICD-10-CM | POA: Diagnosis not present

## 2022-05-28 DIAGNOSIS — M6281 Muscle weakness (generalized): Secondary | ICD-10-CM | POA: Diagnosis not present

## 2022-06-02 DIAGNOSIS — M6281 Muscle weakness (generalized): Secondary | ICD-10-CM | POA: Diagnosis not present

## 2022-06-02 DIAGNOSIS — M1712 Unilateral primary osteoarthritis, left knee: Secondary | ICD-10-CM | POA: Diagnosis not present

## 2022-06-04 DIAGNOSIS — M1712 Unilateral primary osteoarthritis, left knee: Secondary | ICD-10-CM | POA: Diagnosis not present

## 2022-06-04 DIAGNOSIS — M6281 Muscle weakness (generalized): Secondary | ICD-10-CM | POA: Diagnosis not present

## 2022-06-08 NOTE — Progress Notes (Unsigned)
Markus Daft, MD  Donita Brooks D Yes, cancel please.        Previous Messages    ----- Message -----  From: Donita Brooks D  Sent: 06/04/2022   1:34 PM EDT  To: Markus Daft, MD  Subject: Biopsy                                         Do we need to cancel this BX request??   Thanks  [employer]

## 2022-06-09 DIAGNOSIS — M1712 Unilateral primary osteoarthritis, left knee: Secondary | ICD-10-CM | POA: Diagnosis not present

## 2022-06-09 DIAGNOSIS — M6281 Muscle weakness (generalized): Secondary | ICD-10-CM | POA: Diagnosis not present

## 2022-06-11 DIAGNOSIS — M6281 Muscle weakness (generalized): Secondary | ICD-10-CM | POA: Diagnosis not present

## 2022-06-11 DIAGNOSIS — M1712 Unilateral primary osteoarthritis, left knee: Secondary | ICD-10-CM | POA: Diagnosis not present

## 2022-06-16 DIAGNOSIS — M1712 Unilateral primary osteoarthritis, left knee: Secondary | ICD-10-CM | POA: Diagnosis not present

## 2022-06-16 DIAGNOSIS — M6281 Muscle weakness (generalized): Secondary | ICD-10-CM | POA: Diagnosis not present

## 2022-06-18 DIAGNOSIS — M1712 Unilateral primary osteoarthritis, left knee: Secondary | ICD-10-CM | POA: Diagnosis not present

## 2022-06-18 DIAGNOSIS — M6281 Muscle weakness (generalized): Secondary | ICD-10-CM | POA: Diagnosis not present

## 2022-06-21 DIAGNOSIS — I10 Essential (primary) hypertension: Secondary | ICD-10-CM | POA: Diagnosis not present

## 2022-06-21 DIAGNOSIS — L819 Disorder of pigmentation, unspecified: Secondary | ICD-10-CM | POA: Diagnosis not present

## 2022-06-21 DIAGNOSIS — I5022 Chronic systolic (congestive) heart failure: Secondary | ICD-10-CM | POA: Diagnosis not present

## 2022-06-21 DIAGNOSIS — M1711 Unilateral primary osteoarthritis, right knee: Secondary | ICD-10-CM | POA: Diagnosis not present

## 2022-06-21 DIAGNOSIS — M1712 Unilateral primary osteoarthritis, left knee: Secondary | ICD-10-CM | POA: Diagnosis not present

## 2022-06-21 DIAGNOSIS — Z23 Encounter for immunization: Secondary | ICD-10-CM | POA: Diagnosis not present

## 2022-06-21 DIAGNOSIS — E785 Hyperlipidemia, unspecified: Secondary | ICD-10-CM | POA: Diagnosis not present

## 2022-06-21 DIAGNOSIS — R0982 Postnasal drip: Secondary | ICD-10-CM | POA: Diagnosis not present

## 2022-06-21 DIAGNOSIS — R1310 Dysphagia, unspecified: Secondary | ICD-10-CM | POA: Diagnosis not present

## 2022-06-23 DIAGNOSIS — R1900 Intra-abdominal and pelvic swelling, mass and lump, unspecified site: Secondary | ICD-10-CM | POA: Diagnosis not present

## 2022-06-23 DIAGNOSIS — M6281 Muscle weakness (generalized): Secondary | ICD-10-CM | POA: Diagnosis not present

## 2022-06-23 DIAGNOSIS — M1712 Unilateral primary osteoarthritis, left knee: Secondary | ICD-10-CM | POA: Diagnosis not present

## 2022-06-28 ENCOUNTER — Other Ambulatory Visit: Payer: Self-pay | Admitting: General Surgery

## 2022-06-28 DIAGNOSIS — R1900 Intra-abdominal and pelvic swelling, mass and lump, unspecified site: Secondary | ICD-10-CM

## 2022-06-28 DIAGNOSIS — M1712 Unilateral primary osteoarthritis, left knee: Secondary | ICD-10-CM | POA: Diagnosis not present

## 2022-07-09 ENCOUNTER — Ambulatory Visit (INDEPENDENT_AMBULATORY_CARE_PROVIDER_SITE_OTHER): Payer: Medicare Other

## 2022-07-09 DIAGNOSIS — I255 Ischemic cardiomyopathy: Secondary | ICD-10-CM | POA: Diagnosis not present

## 2022-07-10 LAB — CUP PACEART REMOTE DEVICE CHECK
Battery Remaining Longevity: 34 mo
Battery Remaining Percentage: 30 %
Battery Voltage: 2.95 V
Brady Statistic RV Percent Paced: 1 %
Date Time Interrogation Session: 20231201021301
HighPow Impedance: 68 Ohm
HighPow Impedance: 68 Ohm
Implantable Lead Connection Status: 753985
Implantable Lead Implant Date: 20160202
Implantable Lead Location: 753860
Implantable Pulse Generator Implant Date: 20160202
Lead Channel Impedance Value: 360 Ohm
Lead Channel Pacing Threshold Amplitude: 1 V
Lead Channel Pacing Threshold Pulse Width: 0.5 ms
Lead Channel Sensing Intrinsic Amplitude: 10.4 mV
Lead Channel Setting Pacing Amplitude: 2.5 V
Lead Channel Setting Pacing Pulse Width: 0.5 ms
Lead Channel Setting Sensing Sensitivity: 0.5 mV
Pulse Gen Serial Number: 7229147

## 2022-07-15 ENCOUNTER — Other Ambulatory Visit: Payer: Self-pay | Admitting: Cardiovascular Disease

## 2022-07-27 NOTE — Progress Notes (Signed)
Remote ICD transmission.   

## 2022-08-06 ENCOUNTER — Other Ambulatory Visit: Payer: Medicare Other

## 2022-09-03 ENCOUNTER — Encounter: Payer: Self-pay | Admitting: General Surgery

## 2022-09-06 ENCOUNTER — Ambulatory Visit
Admission: RE | Admit: 2022-09-06 | Discharge: 2022-09-06 | Disposition: A | Discharge status: Home / Self Care | Payer: Medicare PPO | Source: Ambulatory Visit | Attending: General Surgery

## 2022-09-06 DIAGNOSIS — R1900 Intra-abdominal and pelvic swelling, mass and lump, unspecified site: Secondary | ICD-10-CM

## 2022-10-08 ENCOUNTER — Ambulatory Visit: Payer: Medicare PPO

## 2022-10-08 DIAGNOSIS — I472 Ventricular tachycardia, unspecified: Secondary | ICD-10-CM

## 2022-10-08 LAB — CUP PACEART REMOTE DEVICE CHECK
Battery Remaining Longevity: 32 mo
Battery Remaining Percentage: 29 %
Battery Voltage: 2.95 V
Brady Statistic RV Percent Paced: 1 %
Date Time Interrogation Session: 20240301024416
HighPow Impedance: 65 Ohm
HighPow Impedance: 65 Ohm
Implantable Lead Connection Status: 753985
Implantable Lead Implant Date: 20160202
Implantable Lead Location: 753860
Implantable Pulse Generator Implant Date: 20160202
Lead Channel Impedance Value: 390 Ohm
Lead Channel Pacing Threshold Amplitude: 1 V
Lead Channel Pacing Threshold Pulse Width: 0.5 ms
Lead Channel Sensing Intrinsic Amplitude: 11.7 mV
Lead Channel Setting Pacing Amplitude: 2.5 V
Lead Channel Setting Pacing Pulse Width: 0.5 ms
Lead Channel Setting Sensing Sensitivity: 0.5 mV
Pulse Gen Serial Number: 7229147

## 2023-01-07 ENCOUNTER — Ambulatory Visit (INDEPENDENT_AMBULATORY_CARE_PROVIDER_SITE_OTHER): Payer: Medicare PPO

## 2023-01-07 DIAGNOSIS — I255 Ischemic cardiomyopathy: Secondary | ICD-10-CM

## 2023-01-07 LAB — CUP PACEART REMOTE DEVICE CHECK
Battery Remaining Longevity: 30 mo
Battery Remaining Percentage: 27 %
Battery Voltage: 2.93 V
Brady Statistic RV Percent Paced: 1 %
Date Time Interrogation Session: 20240531020017
HighPow Impedance: 64 Ohm
HighPow Impedance: 64 Ohm
Implantable Lead Connection Status: 753985
Implantable Lead Implant Date: 20160202
Implantable Lead Location: 753860
Implantable Pulse Generator Implant Date: 20160202
Lead Channel Impedance Value: 350 Ohm
Lead Channel Pacing Threshold Amplitude: 1 V
Lead Channel Pacing Threshold Pulse Width: 0.5 ms
Lead Channel Sensing Intrinsic Amplitude: 9.7 mV
Lead Channel Setting Pacing Amplitude: 2.5 V
Lead Channel Setting Pacing Pulse Width: 0.5 ms
Lead Channel Setting Sensing Sensitivity: 0.5 mV
Pulse Gen Serial Number: 7229147

## 2023-01-12 ENCOUNTER — Other Ambulatory Visit: Payer: Self-pay | Admitting: Internal Medicine

## 2023-01-12 DIAGNOSIS — Z87891 Personal history of nicotine dependence: Secondary | ICD-10-CM

## 2023-01-12 DIAGNOSIS — Z136 Encounter for screening for cardiovascular disorders: Secondary | ICD-10-CM

## 2023-01-14 ENCOUNTER — Ambulatory Visit: Payer: Medicare Other

## 2023-01-25 NOTE — Progress Notes (Signed)
Remote ICD transmission.   

## 2023-01-28 ENCOUNTER — Telehealth: Payer: Self-pay | Admitting: Cardiovascular Disease

## 2023-01-28 NOTE — Telephone Encounter (Signed)
Returned call to patient no answer.Left message on personal voice mail to call back. 

## 2023-01-28 NOTE — Telephone Encounter (Signed)
Pt c/o BP issue: STAT if pt c/o blurred vision, one-sided weakness or slurred speech  1. What are your last 5 BP readings?  90/67  2. Are you having any other symptoms (ex. Dizziness, headache, blurred vision, passed out)?  Lightheadedness when bending over  3. What is your BP issue?   Patient states his BP has been low and [facility].

## 2023-01-28 NOTE — Telephone Encounter (Signed)
Patient called stating his blood pressure has been in the low 90's/60's over a month. He went to the [place] and they stated it might be time for a change in his medication. He took his blood pressure medication while we were on the phone and his BP was 93/56 hr 54. Advised to hold all blood pressure medications (losartan, metoprolol, and spironolactone). Will forward to provider

## 2023-01-28 NOTE — Telephone Encounter (Signed)
Spoke to patient he stated his B/P is better 115/69.Appointment scheduled with Joni Reining DNP 6/27 at 8:50 am.Advised to continue to monitor B/P.Bring readings and all medications to appointment.Advised to call sooner if needed.

## 2023-01-31 NOTE — Progress Notes (Unsigned)
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 Jude defibrillator for primary prevention.    He is only able to tolerate low doses of heart failure medications due to low blood pressure. He reports that at home his systolic blood pressure is usually in the 98-102 range. He has occasional mild dizziness with standing. He is only taking metoprolol succinate 25 mg once daily, losartan half of a 25 mg tablet once daily and take spironolactone 12.5 mg 3 days a week.   Past Medical History    Past Medical History:  Diagnosis Date   Coronary artery disease    a. cath 04/02/2014 3v dx 80-90% in D1, 80-90% mid LAD, 95% apical LAD, 95% distal inferior LV branch of LCx, total occlusion of prox RCA  b). Cath 04/03/2014 DES to mid LAD, DES x3 to prox D1, DAPT indefinitely. LCx lesion untreated   GERD (gastroesophageal reflux disease)    HIV (human immunodeficiency virus infection) (HCC)    Hypertension    Ischemic cardiomyopathy 08/09/2013   EF 20-25 percent by echo in 04/2014, s/p St. Jude [place] model [id number] serial number [id number]   Stroke [facility])    Past Surgical History:  Procedure Laterality Date   CARDIAC CATHETERIZATION  04/02/2014   DR Richard [name]   ESOPHAGOGASTRODUODENOSCOPY (EGD) WITH PROPOFOL N/A 09/08/2018   Procedure: ESOPHAGOGASTRODUODENOSCOPY (EGD) WITH PROPOFOL;  Surgeon: Richard Hawking, MD;  Location: [facility]  ENDOSCOPY;  Service: Endoscopy;  Laterality: N/A;   IMPLANTABLE CARDIOVERTER DEFIBRILLATOR IMPLANT N/A 09/10/2014   Procedure: IMPLANTABLE CARDIOVERTER DEFIBRILLATOR IMPLANT;  Surgeon: Richard Fair, MD;  [facility] [place] model [id number] serial number [id number]   LEFT AND RIGHT HEART CATHETERIZATION WITH CORONARY ANGIOGRAM N/A 04/02/2014   Procedure: LEFT AND RIGHT HEART CATHETERIZATION WITH CORONARY ANGIOGRAM;  Surgeon: Richard Bihari, MD;  Location: [facility] CATH LAB;  Service: Cardiovascular;  Laterality: N/A;   MEDIAL COLLATERAL LIGAMENT REPAIR, KNEE Right 08/25/2015   Procedure: RIGHT THUMB RADICAL COLLATERAL LIGAMENT REPAIR;  Surgeon: Richard Ponder, MD;  Location: [name];  Service: Orthopedics;  Laterality: Right;   PERCUTANEOUS CORONARY STENT INTERVENTION (PCI-S) N/A 04/03/2014   Procedure: PERCUTANEOUS CORONARY STENT INTERVENTION (PCI-S);  Surgeon: Richard M Swaziland, MD;  Location: [facility] CATH LAB;  Service: Cardiovascular;  Laterality: N/A;   SAVORY DILATION N/A 09/08/2018   Procedure: SAVORY DILATION;  Surgeon: Richard Hawking, MD;  Location: [facility] ENDOSCOPY;  Service: Endoscopy;  Laterality: N/A;    Allergies  Allergies  Allergen Reactions   Iodine    Lisinopril Cough   Ioxaglate Hives   Ivp Dye [Iodinated Contrast Media] Hives    History of Present Illness    [name] we are following for ongoing assessment and management of ICM, combined CHF, HTN, HL, CAD with extensive LAD distribution MI, with PPM in situ.   He comes today feeling tired and dizzy.  Over the last week he has stopped taking his metoprolol, he also stopped taking losartan because when he stands up he becomes very  dizzy lightheaded and has fallen to his knees a couple times but is not had any loss of consciousness.    He stopped his spironolactone 2 weeks ago where he was feeling lightheaded and dizzy he was only taking it every third day when he did take it he felt worse.  He brings with him blood pressure readings at  home averaging 93/67 lowest 88/50 9 in the evening time the highest blood pressure was 120/87, but averaging about 112/80.  These blood pressures were on his medications initially, once he stopped his medications blood pressures have risen with more normalcy of 120/80.  Today's blood pressure is 110/70.  He continues to feel some lightheadedness and dizziness with position changes.  Home Medications    Current Outpatient Medications  Medication Sig Dispense Refill   aspirin 81 MG chewable tablet Chew 81 mg by mouth daily.     atorvastatin (LIPITOR) 40 MG tablet Take 1 tablet by mouth once daily 90 tablet 3   Azelaic Acid 15 % cream azelaic acid 15 % topical gel  APPLY A SMALL AMOUNT ON FACE AT NIGHT TIME TWICE A WEEK     carboxymethylcellulose (REFRESH PLUS) 0.5 % SOLN INSTILL 1 DROP IN BOTH EYES FOUR TIMES A DAY     cetirizine (ZYRTEC) 10 MG tablet Take 10 mg by mouth daily.     clotrimazole (LOTRIMIN) 1 % cream Apply 1 Application topically 2 (two) times daily.     darunavir (PREZISTA) 800 MG tablet Take 600 mg by mouth daily with breakfast.     dolutegravir (TIVICAY) 50 MG tablet Take 50 mg by mouth daily.     dolutegravir-lamiVUDine (DOVATO) 50-300 MG tablet Take 1 tablet by mouth daily.     DULoxetine (CYMBALTA) 30 MG capsule Take 30 mg by mouth 2 (two) times daily.     ezetimibe (ZETIA) 10 MG tablet Take 1 tablet (10 mg total) by mouth daily. 90 tablet 2   famotidine (PEPCID) 40 MG tablet Take 1 tablet (40 mg total) by mouth 2 (two) times daily. 60 tablet 2   fluticasone (FLONASE) 50 MCG/ACT nasal spray Place 2 sprays into both nostrils daily as needed for rhinitis.     furosemide (LASIX) 20 MG tablet Take 20 mg by mouth daily. Pt says PCP told him to stop med til after this office visit     lopinavir-ritonavir (KALETRA) 200-50 MG tablet Take 1 tablet by mouth daily with breakfast.     loratadine (CLARITIN) 10 MG tablet Take 10 mg by mouth daily.     losartan (COZAAR) 25 MG tablet Take  0.5 tablets (12.5 mg total) by mouth daily. 45 tablet 6   meloxicam (MOBIC) 15 MG tablet Take 15 mg by mouth as needed.     metoprolol succinate (TOPROL-XL) 25 MG 24 hr tablet Take 1.5 tablets (37.5 mg total) by mouth daily with breakfast. 135 tablet 3   metoprolol tartrate (LOPRESSOR) 50 MG tablet Take 50 mg by mouth 2 (two) times daily.     Multiple Vitamins-Minerals (CENTRUM SILVER PO) Take 2 tablets by mouth daily.     oxybutynin (DITROPAN) 5 MG tablet Take 5 mg by mouth in the morning and at bedtime.     Polyvinyl Alcohol-Povidone PF 1.4-0.6 % SOLN Place 1 drop into both eyes 6 (six) times daily.     pravastatin (PRAVACHOL) 80 MG tablet Take 40 mg by mouth daily.     raltegravir (ISENTRESS) 400 MG tablet Take 400 mg by mouth daily.  risperiDONE (RISPERDAL) 1 MG tablet Take 1.5 mg by mouth at bedtime.     spironolactone (ALDACTONE) 25 MG tablet TAKE ONE-HALF TABLET (12.5 MG ) EVERY THIRD DAY. 90 tablet 3   tacrolimus (PROTOPIC) 0.1 % ointment Apply topically 2 (two) times daily.     Lactobacillus (ACIDOPHILUS/PECTIN PO) Take 1 capsule by mouth daily. (Patient not taking: Reported on 02/03/2023)     Omega-3 Fatty Acids (FISH OIL) 1000 MG CAPS Take by mouth. (Patient not taking: Reported on 02/03/2023)     pantoprazole (PROTONIX) 40 MG tablet Take 1 tablet (40 mg total) by mouth daily as needed. (Patient not taking: Reported on 02/03/2023) 30 tablet 0   No current facility-administered medications for this visit.     Family History    Family History  Problem Relation Age of Onset   Heart disease Mother    Esophageal cancer Neg Hx    Colon cancer Neg Hx    He indicated that his mother is deceased. He indicated that his father is deceased. He indicated that his sister is alive. He indicated that his brother is alive. He indicated that the status of his neg hx is unknown.  Social History    Social History   Socioeconomic History   Marital status: Married    Spouse name: Not on file    Number of children: 1   Years of education: Not on file   Highest education level: Not on file  Occupational History   Occupation: disable Vet  Tobacco Use   Smoking status: Never   Smokeless tobacco: Never  Vaping Use   Vaping Use: Never used  Substance and Sexual Activity   Alcohol use: No   Drug use: No   Sexual activity: Not on file  Other Topics Concern   Not on file  Social History Narrative   Not on file   Social Determinants of Health   Financial Resource Strain: Not on file  Food Insecurity: Not on file  Transportation Needs: Not on file  Physical Activity: Not on file  Stress: Not on file  Social Connections: Not on file  Intimate Partner Violence: Not on file     Review of Systems    General:  No chills, fever, night sweats [name] weight changes.  Cardiovascular:  No chest pain, dyspnea on exertion, edema, orthopnea, palpitations, paroxysmal nocturnal dyspnea.  Significant dizziness, falls, near syncope. Dermatological: No rash, lesions/masses Respiratory: No cough, dyspnea Urologic: No hematuria, dysuria Abdominal:   No nausea, vomiting, diarrhea, bright red blood per rectum, melena, [name] hematemesis Neurologic:  No visual changes, wkns, changes in mental status. All other systems reviewed and are otherwise negative except as noted above.     Physical Exam    VS:  BP 110/70 (BP Location: Left Arm, Patient Position: Sitting, Cuff Size: Normal)   Pulse 84   Ht 5\' 10"  (1.778 m)   Wt 188 lb (85.3 kg)   SpO2 98%   BMI 26.98 kg/m  , BMI Body mass index is 26.98 kg/m.     GEN: Well nourished, well developed, in no acute distress. HEENT: normal. Neck: Supple, no JVD, carotid bruits, [name] masses. Cardiac: IRRR, tachycardic, distant heart sounds, no murmurs, rubs, [name] gallops. No clubbing, cyanosis, edema.  Radials/DP/PT 2+ and equal bilaterally.  Respiratory:  Respirations regular and unlabored, clear to auscultation bilaterally. GI: Soft, nontender,  nondistended, BS + x 4. MS: no deformity [name] atrophy. Skin: warm and dry, no rash. Neuro:  Strength and sensation are  intact. Psych: Normal affect.  Accessory Clinical Findings      Lab Results  Component Value Date   WBC 4.1 04/21/2022   HGB 13.7 04/21/2022   HCT 41.7 04/21/2022   MCV 85.6 04/21/2022   PLT 128.0 (L) 04/21/2022   Lab Results  Component Value Date   CREATININE 1.63 (H) 04/21/2022   BUN 15 04/21/2022   NA 138 04/21/2022   K 4.1 04/21/2022   CL 103 04/21/2022   CO2 27 04/21/2022   Lab Results  Component Value Date   ALT 20 04/21/2022   AST 17 04/21/2022   ALKPHOS 54 04/21/2022   BILITOT 1.0 04/21/2022   Lab Results  Component Value Date   CHOL 82 (L) 06/02/2021   HDL 33 (L) 06/02/2021   LDLCALC 37 06/02/2021   TRIG 43 06/02/2021   CHOLHDL 2.5 06/02/2021    Lab Results  Component Value Date   HGBA1C  04/02/2007    5.7 (NOTE)   The ADA recommends the following therapeutic goals for glycemic   control related to Hgb A1C measurement:   Goal of Therapy:   < 7.0% Hgb A1C   Action Suggested:  > 8.0% Hgb A1C   Ref:  Diabetes Care, 22, Suppl. 1, 1999    Review of Prior Studies Echocardiogram 06/17/2021    1. Left ventricular ejection fraction, by estimation, is 30 to 35%. The  left ventricle has moderately decreased function. The left ventricle  demonstrates global hypokinesis. Left ventricular diastolic parameters are  consistent with Grade II diastolic  dysfunction (pseudonormalization).   2. Right ventricular systolic function is normal. The right ventricular  size is mildly enlarged. There is normal pulmonary artery systolic  pressure. The estimated right ventricular systolic pressure is 29.0 mmHg.   3. Left atrial size was mildly dilated.   4. The mitral valve is normal in structure. Mild to moderate mitral valve  regurgitation. No evidence of mitral stenosis.   5. Tricuspid valve regurgitation is mild to moderate.   6. The aortic valve is normal  in structure. Aortic valve regurgitation is  not visualized. No aortic stenosis is present.   7. The inferior vena cava is normal in size with greater than 50%  respiratory variability, suggesting right atrial pressure of 3 mmHg.    Assessment & Plan   1.  Combined systolic diastolic heart failure: Unguinal repeat his echocardiogram for LV function.  He is having significant dizziness, orthostatic symptoms.  We have checked his orthostatic blood pressures today in clinic.   He was found to be orthostatic with with BP dropping to 89/67, HR 116.  While still standing 90/61 HR 71  He has stopped taking metoprolol, losartan, spironolactone, nor has he been needing to take Lasix which was ordered as needed.  He feels better off of these medications and less dizzy.  He has not had any falls [name] near syncope since stopping medications on his own.  Due to irregular heart rate, tachycardia, I am going to restart him on metoprolol tartrate 25 mg daily only.  I will not restart any other antihypertensives at this time.  Will recheck an echocardiogram for reevaluation of LV function for comparison.  2.  Hypercholesterolemia: Remains on atorvastatin 40 mg daily.  He also is listed as being on pravastatin which he is not taking.  He remains on Zetia as well.  Labs are followed by [facility].  3.  Coronary artery disease: The patient denies any symptoms of angina.  He is not able  to tolerate beta-blocker ARB currently.  This is causing significant hypotension.  Continue statin therapy.  4. Hypertension: BP is low normal today off of his antihypertensives.  Was found to be orthostatic.  Restarting metoprolol tartrate 25 mg daily.  Will see him on close follow-up.  5. Polypharmacy: Will have him see pharmacist for review as there is a high potential for drug/drug reactions.       I have spent 45 minutes with this patient reviewing labs, orthostatic BP and treatment discussion   Signed, Bettey Mare. Liborio Nixon, ANP,  AACC   02/03/2023 9:15 AM      Office [phone] Fax [phone]  Notice: This dictation was prepared with Dragon dictation along with smaller phrase technology. Any transcriptional errors that result from this process are unintentional and may not be corrected upon review.       Signed, Bettey Mare. Liborio Nixon, ANP, AACC   02/03/2023 9:15 AM      Office [phone] Fax [phone]  Notice: This dictation was prepared with Dragon dictation along with smaller phrase technology. Any transcriptional errors that result from this process are unintentional and may not be corrected upon review.

## 2023-02-03 ENCOUNTER — Encounter: Payer: Self-pay | Admitting: Adult Health

## 2023-02-03 ENCOUNTER — Ambulatory Visit: Payer: Medicare PPO | Attending: Adult Health | Admitting: Adult Health

## 2023-02-03 VITALS — BP 110/70 | HR 84 | Ht 70.0 in | Wt 188.0 lb

## 2023-02-03 DIAGNOSIS — I10 Essential (primary) hypertension: Secondary | ICD-10-CM | POA: Diagnosis not present

## 2023-02-03 DIAGNOSIS — I2510 Atherosclerotic heart disease of native coronary artery without angina pectoris: Secondary | ICD-10-CM

## 2023-02-03 DIAGNOSIS — Z95810 Presence of automatic (implantable) cardiac defibrillator: Secondary | ICD-10-CM

## 2023-02-03 DIAGNOSIS — I952 Hypotension due to drugs: Secondary | ICD-10-CM

## 2023-02-03 DIAGNOSIS — I255 Ischemic cardiomyopathy: Secondary | ICD-10-CM | POA: Diagnosis not present

## 2023-02-03 DIAGNOSIS — I5042 Chronic combined systolic (congestive) and diastolic (congestive) heart failure: Secondary | ICD-10-CM | POA: Diagnosis not present

## 2023-02-03 DIAGNOSIS — I472 Ventricular tachycardia, unspecified: Secondary | ICD-10-CM

## 2023-02-03 DIAGNOSIS — E785 Hyperlipidemia, unspecified: Secondary | ICD-10-CM

## 2023-02-03 MED ORDER — METOPROLOL TARTRATE 25 MG PO TABS: 25.0000 mg | ORAL_TABLET | Freq: Every day | ORAL | 3 refills | Status: DC

## 2023-02-03 NOTE — Patient Instructions (Signed)
Medication Instructions: Stop Metoprolol Succinate. Start metoprolol Tartrate 25 mg ( Take 1 Tablet Daily). *If you need a refill on your cardiac medications before your next appointment, please call your pharmacy*   Lab Work: BMET Today If you have labs (blood work) drawn today and your tests are completely normal, you will receive your results only by: MyChart Message (if you have MyChart) OR A paper copy in the mail If you have any lab test that is abnormal or we need to change your treatment, we will call you to review the results.   Testing/Procedures: [address], [address]. Your physician has requested that you have an echocardiogram. Echocardiography is a painless test that uses sound waves to create images of your heart. It provides your doctor with information about the size and shape of your heart and how well your heart's chambers and valves are working. This procedure takes approximately one hour. There are no restrictions for this procedure. Please do NOT wear cologne, perfume, aftershave, or lotions (deodorant is allowed). Please arrive 15 minutes prior to your appointment time.    Follow-Up: At [facility], you and your health needs are our priority.  As part of our continuing mission to provide you with exceptional heart care, we have created designated Provider Care Teams.  These Care Teams include your primary Cardiologist (physician) and Advanced Practice Providers (APPs -  Physician Assistants and Nurse Practitioners) who all work together to provide you with the care you need, when you need it.  We recommend signing up for the patient portal called "MyChart".  Sign up information is provided on this After Visit Summary.  MyChart is used to connect with patients for Virtual Visits (Telemedicine).  Patients are able to view lab/test results, encounter notes, upcoming appointments, etc.  Non-urgent messages can be sent to your provider as well.   To  learn more about what you can do with MyChart, go to [email or web address].    Your next appointment:   2 week(s)  Provider:   Joni Reining, DNP, ANP      Other Instructions Bring Blood Pressure Log to Follow -up Appointment.

## 2023-02-04 ENCOUNTER — Telehealth: Payer: Self-pay

## 2023-02-04 LAB — BASIC METABOLIC PANEL
BUN/Creatinine Ratio: 7 — ABNORMAL LOW (ref 10–24)
BUN: 11 mg/dL (ref 8–27)
CO2: 25 mmol/L (ref 20–29)
Calcium: 9.1 mg/dL (ref 8.6–10.2)
Chloride: 104 mmol/L (ref 96–106)
Creatinine, Ser: 1.51 mg/dL — ABNORMAL HIGH (ref 0.76–1.27)
Glucose: 86 mg/dL (ref 70–99)
Potassium: 4.3 mmol/L (ref 3.5–5.2)
Sodium: 139 mmol/L (ref 134–144)
eGFR: 51 mL/min/{1.73_m2} — ABNORMAL LOW (ref >59)

## 2023-02-04 NOTE — Telephone Encounter (Addendum)
Called patient regarding results. Left detailed message for patient regarding results.----- Message from Jodelle Gross, NP sent at 02/04/2023  7:07 AM EDT ----- I have reviewed labs.  The kidney function is improving. No concerns here. Continue current management and see pharmacist for polypharmacy.

## 2023-02-12 NOTE — Progress Notes (Deleted)
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 Jude defibrillator for primary prevention.     He is only able to tolerate low doses of heart failure medications due to low blood pressure. He reports that at home his systolic blood pressure is usually in the 98-102 range. He has occasional mild dizziness with standing. He is only taking metoprolol succinate 25 mg once daily, losartan half of a 25 mg tablet once daily and take spironolactone 12.5 mg 3 days a week. 02/03/2023   On last office visit 02/03/2023 he had stopped taking his metoprolol and spironolactone due to lighttheadness and dizziness  I restarted him on metoprolol 25 mg daily. Echocardiogram was ordered. Not scheduled until 03/03/2023.   Past Medical History    Past Medical History:  Diagnosis Date   Coronary artery disease    a. cath 04/02/2014 3v dx 80-90% in D1, 80-90% mid LAD, 95% apical LAD, 95% distal inferior LV branch of LCx, total occlusion of prox RCA  b). Cath 04/03/2014 DES to mid LAD, DES x3 to prox D1, DAPT indefinitely. LCx lesion untreated   GERD (gastroesophageal reflux disease)    HIV (human immunodeficiency virus infection) (HCC)    Hypertension    Ischemic cardiomyopathy 08/09/2013   EF 20-25 percent by echo in 04/2014, s/p St. Jude [place] model [id number] serial number [id number]   Stroke [facility])    Past Surgical History:  Procedure Laterality Date   CARDIAC CATHETERIZATION  04/02/2014   DR Tresa Endo   ESOPHAGOGASTRODUODENOSCOPY (EGD) WITH PROPOFOL N/A 09/08/2018   Procedure: ESOPHAGOGASTRODUODENOSCOPY (EGD) WITH  PROPOFOL;  Surgeon: Jeani Hawking, MD;  Location: [facility] ENDOSCOPY;  Service: Endoscopy;  Laterality: N/A;   IMPLANTABLE CARDIOVERTER DEFIBRILLATOR IMPLANT N/A 09/10/2014   Procedure: IMPLANTABLE CARDIOVERTER DEFIBRILLATOR IMPLANT;  Surgeon: Thurmon Fair, MD;  [facility] [place] model [id number] serial number [id number]   LEFT AND RIGHT HEART CATHETERIZATION WITH CORONARY ANGIOGRAM N/A 04/02/2014   Procedure: LEFT AND RIGHT HEART CATHETERIZATION WITH CORONARY ANGIOGRAM;  Surgeon: Lennette Bihari, MD;  Location: [facility] CATH LAB;  Service: Cardiovascular;  Laterality: N/A;   MEDIAL COLLATERAL LIGAMENT REPAIR, KNEE Right 08/25/2015   Procedure: RIGHT THUMB RADICAL COLLATERAL LIGAMENT REPAIR;  Surgeon: Dairl Ponder, MD;  Location: [facility] OR;  Service: Orthopedics;  Laterality: Right;   PERCUTANEOUS CORONARY STENT INTERVENTION (PCI-S) N/A 04/03/2014   Procedure: PERCUTANEOUS CORONARY STENT INTERVENTION (PCI-S);  Surgeon: Peter M Swaziland, MD;  Location: [facility] CATH LAB;  Service: Cardiovascular;  Laterality: N/A;   SAVORY DILATION N/A 09/08/2018   Procedure: SAVORY DILATION;  Surgeon: Jeani Hawking, MD;  Location: [facility] ENDOSCOPY;  Service: Endoscopy;  Laterality: N/A;    Allergies  Allergies  Allergen Reactions   Iodine    Lisinopril Cough   Ioxaglate Hives   Ivp Dye [Iodinated Contrast Media] Hives    History of Present Illness    Richard [name] returns today for reevaluation of dizziness in the setting of systolic CHF.  On last office visit he was orthostatic with an irregular heart rhythm which did not reveal atrial fibrillation.  He was restarted on low-dose metoprolol, did not restart spironolactone or Lasix.  Home Medications  Current Outpatient Medications  Medication Sig Dispense Refill   aspirin 81 MG chewable tablet Chew 81 mg by mouth daily.     atorvastatin (LIPITOR) 40 MG tablet Take 1 tablet by mouth once daily 90 tablet 3   Azelaic Acid 15 % cream azelaic acid 15 % topical gel  APPLY A  SMALL AMOUNT ON FACE AT NIGHT TIME TWICE A WEEK     carboxymethylcellulose (REFRESH PLUS) 0.5 % SOLN INSTILL 1 DROP IN BOTH EYES FOUR TIMES A DAY     cetirizine (ZYRTEC) 10 MG tablet Take 10 mg by mouth daily.     clotrimazole (LOTRIMIN) 1 % cream Apply 1 Application topically 2 (two) times daily.     darunavir (PREZISTA) 800 MG tablet Take 600 mg by mouth daily with breakfast.     dolutegravir (TIVICAY) 50 MG tablet Take 50 mg by mouth daily.     dolutegravir-lamiVUDine (DOVATO) 50-300 MG tablet Take 1 tablet by mouth daily.     DULoxetine (CYMBALTA) 30 MG capsule Take 30 mg by mouth 2 (two) times daily.     ezetimibe (ZETIA) 10 MG tablet Take 1 tablet (10 mg total) by mouth daily. 90 tablet 2   famotidine (PEPCID) 40 MG tablet Take 1 tablet (40 mg total) by mouth 2 (two) times daily. 60 tablet 2   fluticasone (FLONASE) 50 MCG/ACT nasal spray Place 2 sprays into both nostrils daily as needed for rhinitis.     furosemide (LASIX) 20 MG tablet Take 20 mg by mouth daily. Pt says PCP told him to stop med til after this office visit     Lactobacillus (ACIDOPHILUS/PECTIN PO) Take 1 capsule by mouth daily. (Patient not taking: Reported on 02/03/2023)     lopinavir-ritonavir (KALETRA) 200-50 MG tablet Take 1 tablet by mouth daily with breakfast.     loratadine (CLARITIN) 10 MG tablet Take 10 mg by mouth daily.     losartan (COZAAR) 25 MG tablet Take 0.5 tablets (12.5 mg total) by mouth daily. 45 tablet 6   meloxicam (MOBIC) 15 MG tablet Take 15 mg by mouth as needed.     metoprolol tartrate (LOPRESSOR) 25 MG tablet Take 1 tablet (25 mg total) by mouth daily. 90 tablet 3   Multiple Vitamins-Minerals (CENTRUM SILVER PO) Take 2 tablets by mouth daily.     Omega-3 Fatty Acids (FISH OIL) 1000 MG CAPS Take by mouth. (Patient not taking: Reported on 02/03/2023)     oxybutynin (DITROPAN) 5 MG tablet Take 5 mg by mouth in the morning and at bedtime.     pantoprazole (PROTONIX) 40 MG tablet Take 1 tablet (40 mg  total) by mouth daily as needed. (Patient not taking: Reported on 02/03/2023) 30 tablet 0   Polyvinyl Alcohol-Povidone PF 1.4-0.6 % SOLN Place 1 drop into both eyes 6 (six) times daily.     raltegravir (ISENTRESS) 400 MG tablet Take 400 mg by mouth daily.     risperiDONE (RISPERDAL) 1 MG tablet Take 1.5 mg by mouth at bedtime.     spironolactone (ALDACTONE) 25 MG tablet TAKE ONE-HALF TABLET (12.5 MG ) EVERY THIRD DAY. 90 tablet 3   tacrolimus (PROTOPIC) 0.1 % ointment Apply topically 2 (two) times daily.     No current facility-administered medications for this visit.     Family History    Family History  Problem Relation Age of Onset   Heart disease Mother    Esophageal cancer Neg Hx    Colon cancer Neg Hx    He  indicated that his mother is deceased. He indicated that his father is deceased. He indicated that his sister is alive. He indicated that his brother is alive. He indicated that the status of his neg hx is unknown.  Social History    Social History   Socioeconomic History   Marital status: Married    Spouse name: Not on file   Number of children: 1   Years of education: Not on file   Highest education level: Not on file  Occupational History   Occupation: disable Vet  Tobacco Use   Smoking status: Never   Smokeless tobacco: Never  Vaping Use   Vaping Use: Never used  Substance and Sexual Activity   Alcohol use: No   Drug use: No   Sexual activity: Not on file  Other Topics Concern   Not on file  Social History Narrative   Not on file   Social Determinants of Health   Financial Resource Strain: Not on file  Food Insecurity: Not on file  Transportation Needs: Not on file  Physical Activity: Not on file  Stress: Not on file  Social Connections: Not on file  Intimate Partner Violence: Not on file     Review of Systems    General:  No chills, fever, night sweats or weight changes.  Cardiovascular:  No chest pain, dyspnea on exertion, edema, orthopnea,  palpitations, paroxysmal nocturnal dyspnea. Dermatological: No rash, lesions/masses Respiratory: No cough, dyspnea Urologic: No hematuria, dysuria Abdominal:   No nausea, vomiting, diarrhea, bright red blood per rectum, melena, or hematemesis Neurologic:  No visual changes, wkns, changes in mental status. All other systems reviewed and are otherwise negative except as noted above.       Physical Exam    VS:  There were no vitals taken for this visit. , BMI There is no height or weight on file to calculate BMI.     GEN: Well nourished, well developed, in no acute distress. HEENT: normal. Neck: Supple, no JVD, carotid bruits, or masses. Cardiac: RRR, no murmurs, rubs, or gallops. No clubbing, cyanosis, edema.  Radials/DP/PT 2+ and equal bilaterally.  Respiratory:  Respirations regular and unlabored, clear to auscultation bilaterally. GI: Soft, nontender, nondistended, BS + x 4. MS: no deformity or atrophy. Skin: warm and dry, no rash. Neuro:  Strength and sensation are intact. Psych: Normal affect.      Lab Results  Component Value Date   WBC 4.1 04/21/2022   HGB 13.7 04/21/2022   HCT 41.7 04/21/2022   MCV 85.6 04/21/2022   PLT 128.0 (L) 04/21/2022   Lab Results  Component Value Date   CREATININE 1.51 (H) 02/03/2023   BUN 11 02/03/2023   NA 139 02/03/2023   K 4.3 02/03/2023   CL 104 02/03/2023   CO2 25 02/03/2023   Lab Results  Component Value Date   ALT 20 04/21/2022   AST 17 04/21/2022   ALKPHOS 54 04/21/2022   BILITOT 1.0 04/21/2022   Lab Results  Component Value Date   CHOL 82 (L) 06/02/2021   HDL 33 (L) 06/02/2021   LDLCALC 37 06/02/2021   TRIG 43 06/02/2021   CHOLHDL 2.5 06/02/2021    Lab Results  Component Value Date   HGBA1C  04/02/2007    5.7 (NOTE)   The ADA recommends the following therapeutic goals for glycemic   control related to Hgb A1C measurement:   Goal of Therapy:   < 7.0% Hgb A1C   Action Suggested:  > 8.0% Hgb  A1C   Ref:  Diabetes  Care, 22, Suppl. 1, 1999     Review of Prior Studies    Echocardiogram 06/17/2021 1. Left ventricular ejection fraction, by estimation, is 30 to 35%. The  left ventricle has moderately decreased function. The left ventricle  demonstrates global hypokinesis. Left ventricular diastolic parameters are  consistent with Grade II diastolic  dysfunction (pseudonormalization).   2. Right ventricular systolic function is normal. The right ventricular  size is mildly enlarged. There is normal pulmonary artery systolic  pressure. The estimated right ventricular systolic pressure is 29.0 mmHg.   3. Left atrial size was mildly dilated.   4. The mitral valve is normal in structure. Mild to moderate mitral valve  regurgitation. No evidence of mitral stenosis.   5. Tricuspid valve regurgitation is mild to moderate.   6. The aortic valve is normal in structure. Aortic valve regurgitation is  not visualized. No aortic stenosis is present.   7. The inferior vena cava is normal in size with greater than 50%  respiratory variability, suggesting right atrial pressure of 3 mmHg.    Assessment & Plan   1.  ***     {Are you ordering a CV Procedure (e.g. stress test, cath, DCCV, TEE, etc)?   Press F2        :[id number]}   Signed, Bettey Mare. Liborio Nixon, ANP, AACC   02/12/2023 8:49 AM      Office [phone] Fax [phone]  Notice: This dictation was prepared with Dragon dictation along with smaller phrase technology. Any transcriptional errors that result from this process are unintentional and may not be corrected upon review.

## 2023-02-17 ENCOUNTER — Ambulatory Visit: Payer: Medicare PPO | Admitting: Adult Health

## 2023-03-03 ENCOUNTER — Ambulatory Visit (HOSPITAL_COMMUNITY): Payer: Medicare PPO | Attending: Adult Health

## 2023-03-03 DIAGNOSIS — I255 Ischemic cardiomyopathy: Secondary | ICD-10-CM | POA: Diagnosis not present

## 2023-03-03 DIAGNOSIS — E785 Hyperlipidemia, unspecified: Secondary | ICD-10-CM | POA: Diagnosis not present

## 2023-03-03 DIAGNOSIS — Z95810 Presence of automatic (implantable) cardiac defibrillator: Secondary | ICD-10-CM | POA: Diagnosis not present

## 2023-03-03 DIAGNOSIS — I509 Heart failure, unspecified: Secondary | ICD-10-CM | POA: Diagnosis not present

## 2023-03-03 DIAGNOSIS — I110 Hypertensive heart disease with heart failure: Secondary | ICD-10-CM | POA: Diagnosis not present

## 2023-03-03 DIAGNOSIS — I2510 Atherosclerotic heart disease of native coronary artery without angina pectoris: Secondary | ICD-10-CM | POA: Insufficient documentation

## 2023-03-03 DIAGNOSIS — I472 Ventricular tachycardia, unspecified: Secondary | ICD-10-CM | POA: Insufficient documentation

## 2023-03-03 LAB — ECHOCARDIOGRAM COMPLETE
Area-P 1/2: 4.01 cm2
S' Lateral: 4.9 cm

## 2023-03-07 ENCOUNTER — Ambulatory Visit: Payer: Medicare PPO | Attending: Cardiology | Admitting: Pharmacist

## 2023-03-07 ENCOUNTER — Encounter: Payer: Self-pay | Admitting: Pharmacist

## 2023-03-07 ENCOUNTER — Telehealth: Payer: Self-pay

## 2023-03-07 VITALS — BP 90/64 | HR 60 | Temp 100.0°F | Wt 184.4 lb

## 2023-03-07 DIAGNOSIS — R1013 Epigastric pain: Secondary | ICD-10-CM | POA: Insufficient documentation

## 2023-03-07 DIAGNOSIS — I10 Essential (primary) hypertension: Secondary | ICD-10-CM | POA: Diagnosis not present

## 2023-03-07 DIAGNOSIS — B20 Human immunodeficiency virus [HIV] disease: Secondary | ICD-10-CM

## 2023-03-07 DIAGNOSIS — I5042 Chronic combined systolic (congestive) and diastolic (congestive) heart failure: Secondary | ICD-10-CM | POA: Diagnosis not present

## 2023-03-07 DIAGNOSIS — F29 Unspecified psychosis not due to a substance or known physiological condition: Secondary | ICD-10-CM | POA: Insufficient documentation

## 2023-03-07 DIAGNOSIS — I2510 Atherosclerotic heart disease of native coronary artery without angina pectoris: Secondary | ICD-10-CM | POA: Insufficient documentation

## 2023-03-07 DIAGNOSIS — Z961 Presence of intraocular lens: Secondary | ICD-10-CM | POA: Insufficient documentation

## 2023-03-07 DIAGNOSIS — K219 Gastro-esophageal reflux disease without esophagitis: Secondary | ICD-10-CM | POA: Insufficient documentation

## 2023-03-07 DIAGNOSIS — F339 Major depressive disorder, recurrent, unspecified: Secondary | ICD-10-CM | POA: Insufficient documentation

## 2023-03-07 DIAGNOSIS — H25011 Cortical age-related cataract, right eye: Secondary | ICD-10-CM | POA: Insufficient documentation

## 2023-03-07 DIAGNOSIS — F411 Generalized anxiety disorder: Secondary | ICD-10-CM | POA: Insufficient documentation

## 2023-03-07 DIAGNOSIS — F209 Schizophrenia, unspecified: Secondary | ICD-10-CM | POA: Insufficient documentation

## 2023-03-07 MED ORDER — EMPAGLIFLOZIN 10 MG PO TABS: 10.0000 mg | ORAL_TABLET | Freq: Every day | ORAL | 5 refills | Status: DC

## 2023-03-07 NOTE — Patient Instructions (Addendum)
It was nice meeting you  I am glad you are feeling better  Please discontinue your metoprolol tartrate 25mg   Please restart your metoprolol succinate 25mg  (1/2 tablet) once a day  Continue your spironolactone 25mg  1/2 tablet every 3 days  Continue to hold your losartan and furosemide  I have also sent a new prescription for Jardiance 10mg  to the [place] for you. It will be one tablet by mouth once daily in the morning  Please call or message with any questions  Laural Golden, PharmD, BCACP, CDCES, CPP [address], [address] [place], [place], [address] Phone: [phone], Fax: [phone]

## 2023-03-07 NOTE — Telephone Encounter (Addendum)
Called patient regarding results. Left detailed message for patient regarding results. Letter mailed to patient on 03/07/23.----- Message from Joni Reining sent at 03/03/2023  1:07 PM EDT ----- I have reviewed the echo report. There are no changes in heart function compared to his echo in 2022. This is reassuring.  No changes in his regimen for now.   KL

## 2023-03-07 NOTE — Progress Notes (Signed)
Patient ID: STONE WATRING                 DOB: [birth year]                      MRN: [id number]     HPI: Richard [name] is a 67 y.o. male referred by Joni Reining to HTN clinic. PMH is significant for CHF, ventricular tachycardia, CAD, CVA, CKD, schizophrenia, depression, and HIV infection. Has ICD. Is a patient of Dr Royann Shivers.  At last visit with Joni Reining patient was hypotensive and symptomatic. CHF medications were held and patient was referred to PharmD clinic due to extensive med list.  Patient is seen by [facility] and the [place]. Many medications have been changed or discontinued and list has not updated. Had 4 HIV antivirals active on list, many with drug-drug interactions. However patient is familiar with his medications and is currently only on Dovato (dolutegravir-lamivudine) which has far fewer interactions.  Currently holding losartan and furosemide due to hypotension although he is asymptomatic today. Takes spironolactone 12.5mg  once every 3 days. Previously on metoprolol succinate however was switched to metoprolol tartrate 25mg  once daily due to tachycardia.   Patient denies SOB and edema. Reports he feels like he has more energy.  Brought home BP readings: 7/28: 94/74  65 7/27: 116/74  75 7/26: 109/70  66 7/25: 111/75   70 7/24: 104/79  74  Current CHF meds:  Spironolactone 12.5mg  once every 3 days Metoprolol tartrate 25 mg once daily Losartan 12.5mg  daily (currently holding) Furosemide 20mg  once daily (currently holding)  BP goal: <130/80  Wt Readings from Last 3 Encounters:  03/07/23 184 lb 6.4 oz (83.6 kg)  02/03/23 188 lb (85.3 kg)  05/20/22 184 lb (83.5 kg)   BP Readings from Last 3 Encounters:  03/07/23 90/64  02/03/23 110/70  05/20/22 (!) 90/50   Pulse Readings from Last 3 Encounters:  03/07/23 60  02/03/23 84  05/20/22 66    Renal function: CrCl cannot be calculated (Patient's most recent lab result is older than the maximum 21  days allowed.).  Past Medical History:  Diagnosis Date   Coronary artery disease    a. cath 04/02/2014 3v dx 80-90% in D1, 80-90% mid LAD, 95% apical LAD, 95% distal inferior LV branch of LCx, total occlusion of prox RCA  b). Cath 04/03/2014 DES to mid LAD, DES x3 to prox D1, DAPT indefinitely. LCx lesion untreated   GERD (gastroesophageal reflux disease)    HIV (human immunodeficiency virus infection) (HCC)    Hypertension    Ischemic cardiomyopathy 08/09/2013   EF 20-25 percent by echo in 04/2014, s/p St. Jude [employer] [place] model [id number] serial number [id number]   Stroke [facility])     Current Outpatient Medications on File Prior to Visit  Medication Sig Dispense Refill   aspirin 81 MG chewable tablet Chew 81 mg by mouth daily.     atorvastatin (LIPITOR) 40 MG tablet Take 1 tablet by mouth once daily 90 tablet 3   carboxymethylcellulose (REFRESH PLUS) 0.5 % SOLN INSTILL 1 DROP IN BOTH EYES FOUR TIMES A DAY     cetirizine (ZYRTEC) 10 MG tablet Take 10 mg by mouth daily.     dolutegravir-lamiVUDine (DOVATO) 50-300 MG tablet Take 1 tablet by mouth daily.     DULoxetine (CYMBALTA) 30 MG capsule Take 30 mg by mouth 2 (two) times daily.     ezetimibe (ZETIA) 10 MG tablet Take 1 tablet (10 mg total)  by mouth daily. 90 tablet 2   famotidine (PEPCID) 40 MG tablet Take 1 tablet (40 mg total) by mouth 2 (two) times daily. 60 tablet 2   fluticasone (FLONASE) 50 MCG/ACT nasal spray Place 2 sprays into both nostrils daily as needed for rhinitis.     loratadine (CLARITIN) 10 MG tablet Take 10 mg by mouth daily.     metoprolol succinate (TOPROL-XL) 25 MG 24 hr tablet Take 0.5 tablets (12.5 mg total) by mouth daily.     Multiple Vitamins-Minerals (CENTRUM SILVER PO) Take 2 tablets by mouth daily.     oxybutynin (DITROPAN) 5 MG tablet Take 5 mg by mouth in the morning and at bedtime.     pantoprazole (PROTONIX) 40 MG tablet Take 1 tablet (40 mg total) by mouth daily as needed. 30 tablet 0   risperiDONE  (RISPERDAL) 1 MG tablet Take 1.5 mg by mouth at bedtime.     spironolactone (ALDACTONE) 25 MG tablet TAKE ONE-HALF TABLET (12.5 MG ) EVERY THIRD DAY. 90 tablet 3   tacrolimus (PROTOPIC) 0.1 % ointment Apply topically 2 (two) times daily.     furosemide (LASIX) 20 MG tablet Take 20 mg by mouth daily. Pt says PCP told him to stop med til after this office visit (Patient not taking: Reported on 03/07/2023)     losartan (COZAAR) 25 MG tablet Take 0.5 tablets (12.5 mg total) by mouth daily. (Patient not taking: Reported on 03/07/2023) 45 tablet 6   No current facility-administered medications on file prior to visit.    Allergies  Allergen Reactions   Iodine    Lisinopril Cough   Ioxaglate Hives   Ivp Dye [Iodinated Contrast Media] Hives     Assessment/Plan:  1. CHF -  Patient BP in room 90/64 and remains low making GDMT challenging.  Has not seen Dr C recently.   Went through each medication and indication with patient and updated list as appropriate. Pulled in all outside medications from [place].   Will switch lopressor back to metoprolol succinate 12.5mg  once daily. Since BP low, will continue to hold losartan but will add Jardiance to help with his HFrEF and CKD. [facility]. Advised to continue to monitor BP at home.  Will take patient to desk and schedule follow up with Dr C. Patient appreciative.  Continue spironolactone 12.5mg  every 3 days D/C metoprolol tartrate and start metoprolol succinate 12.5mg  once daily Continue to hold furosemide and losartan Start Jardiance 10mg  once daily Follow up with Dr Augustin Schooling, PharmD, BCACP, CDCES, CPP [address] [place], [place], [address] Phone: [phone], Fax: [phone]

## 2023-03-08 ENCOUNTER — Encounter: Payer: Self-pay | Admitting: Cardiovascular Disease

## 2023-03-08 ENCOUNTER — Ambulatory Visit: Payer: Medicare PPO | Attending: Cardiovascular Disease | Admitting: Cardiovascular Disease

## 2023-03-08 VITALS — BP 106/68 | HR 74 | Ht 70.5 in | Wt 189.8 lb

## 2023-03-08 DIAGNOSIS — I472 Ventricular tachycardia, unspecified: Secondary | ICD-10-CM | POA: Diagnosis not present

## 2023-03-08 DIAGNOSIS — I5042 Chronic combined systolic (congestive) and diastolic (congestive) heart failure: Secondary | ICD-10-CM | POA: Diagnosis not present

## 2023-03-08 DIAGNOSIS — I2510 Atherosclerotic heart disease of native coronary artery without angina pectoris: Secondary | ICD-10-CM | POA: Diagnosis not present

## 2023-03-08 DIAGNOSIS — Z21 Asymptomatic human immunodeficiency virus [HIV] infection status: Secondary | ICD-10-CM

## 2023-03-08 DIAGNOSIS — E785 Hyperlipidemia, unspecified: Secondary | ICD-10-CM

## 2023-03-08 DIAGNOSIS — Z95810 Presence of automatic (implantable) cardiac defibrillator: Secondary | ICD-10-CM | POA: Diagnosis not present

## 2023-03-08 NOTE — Patient Instructions (Signed)

## 2023-03-08 NOTE — Progress Notes (Signed)
Cardiology office note    Date:  03/08/2023   ID:  Richard [name], DOB January 29, 1956, MRN [id number]  PCP:  [place], MD  Cardiologist:  Nicki Guadalajara, MD ; Rozalia Dino (device) Electrophysiologist:  None   Evaluation Performed:  Follow-Up Visit  Chief Complaint: ICD check  History of Present Illness:    Richard [name] is a 67 y.o. 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 Jude defibrillator for primary prevention.  Following ICD implantation he has had 2 appropriate interventions for the device for sustained ventricular tachycardia.  In 2018, following abrupt interruption in his beta-blocker, he had ventricular fibrillation terminated by single shock.  Most recently on November 05, 2021 (monomorphic VT with a cycle length of 255 ms, successfully interrupted with a single cycle of ATP only, no need for shock).  Since then he is generally done well.  He exercises at the [facility] about 3 times a week for an hour each time, 20-30 minutes on the stationary bike and then lifting light weights.  He does not have angina or dyspnea with activity.  Seems to be NYHA functional class I.  Earlier this year he once again stopped virtually all his heart medicines because of weakness and dizziness.  He is now back on a low-dose of metoprolol succinate 12.5 mg once daily and a very low-dose of spironolactone 12.5 mg 3 times a week.  He has been prescribed Jardiance but is waiting to receive the go-ahead from the [place] pharmacy before starting this medication.  He does not have orthopnea, PND, lower extremity edema.  He has not had syncope and denies palpitations.  HIV remains well-controlled (monitored at the [place]).  He is on statin for hypercholesterolemia, labs also followed at the [place] (most recent labs we  have on file was an LDL cholesterol of 37).  He has prediabetes with a hemoglobin A1c that is around 5.8%.  His echocardiogram in July 2024 showed slight improvement in LVEF/30-35%, grade 1 diastolic dysfunction, moderately dilated left atrium, no significant valve abnormalities.  Comprehensive ICD check in the office today shows normal device function.  Estimated generator longevity just over 2 years.  Never requires pacing.  He has not had any episodes of sustained or nonsustained VT.  His most recent nuclear stress test in 2015 showed extensive scar without ischemia and the left ventricular ejection fraction of 27%.  He received an appropriate shock for ventricular fibrillation on September 30, 2016, after he been out of his metoprolol for about a week.  He had ATP for sustained monomorphic VT in March 2023.  He is a disabled Investment banker, operational.  Past Medical History:  Diagnosis Date   Coronary artery disease    a. cath 04/02/2014 3v dx 80-90% in D1, 80-90% mid LAD, 95% apical LAD, 95% distal inferior LV branch of LCx, total occlusion of prox RCA  b). Cath 04/03/2014 DES to mid LAD, DES x3 to prox D1, DAPT indefinitely. LCx lesion untreated   GERD (gastroesophageal reflux disease)    HIV (human immunodeficiency virus infection) (HCC)    Hypertension    Ischemic cardiomyopathy 08/09/2013   EF 20-25 percent by echo in 04/2014, s/p St. Jude Fortify Assura model [id number] serial number [id number]   Stroke [facility])    Past Surgical History:  Procedure Laterality Date   CARDIAC CATHETERIZATION  04/02/2014   DR  KELLY   ESOPHAGOGASTRODUODENOSCOPY (EGD) WITH PROPOFOL N/A 09/08/2018   Procedure: ESOPHAGOGASTRODUODENOSCOPY (EGD) WITH PROPOFOL;  Surgeon: Jeani Hawking, MD;  Location: [facility] ENDOSCOPY;  Service: Endoscopy;  Laterality: N/A;   IMPLANTABLE CARDIOVERTER DEFIBRILLATOR IMPLANT N/A 09/10/2014   Procedure: IMPLANTABLE CARDIOVERTER DEFIBRILLATOR IMPLANT;  Surgeon: Thurmon Fair, MD;  [facility]  model [id number] serial number [id number]   LEFT AND RIGHT HEART CATHETERIZATION WITH CORONARY ANGIOGRAM N/A 04/02/2014   Procedure: LEFT AND RIGHT HEART CATHETERIZATION WITH CORONARY ANGIOGRAM;  Surgeon: Lennette Bihari, MD;  Location: [facility] CATH LAB;  Service: Cardiovascular;  Laterality: N/A;   MEDIAL COLLATERAL LIGAMENT REPAIR, KNEE Right 08/25/2015   Procedure: RIGHT THUMB RADICAL COLLATERAL LIGAMENT REPAIR;  Surgeon: Dairl Ponder, MD;  Location: [facility] OR;  Service: Orthopedics;  Laterality: Right;   PERCUTANEOUS CORONARY STENT INTERVENTION (PCI-S) N/A 04/03/2014   Procedure: PERCUTANEOUS CORONARY STENT INTERVENTION (PCI-S);  Surgeon: Peter M Swaziland, MD;  Location: [facility] CATH LAB;  Service: Cardiovascular;  Laterality: N/A;   SAVORY DILATION N/A 09/08/2018   Procedure: SAVORY DILATION;  Surgeon: Jeani Hawking, MD;  Location: [facility] ENDOSCOPY;  Service: Endoscopy;  Laterality: N/A;     Current Meds  Medication Sig   aspirin 81 MG chewable tablet Chew 81 mg by mouth daily.   atorvastatin (LIPITOR) 40 MG tablet Take 1 tablet by mouth once daily   carboxymethylcellulose (REFRESH PLUS) 0.5 % SOLN INSTILL 1 DROP IN BOTH EYES FOUR TIMES A DAY   cetirizine (ZYRTEC) 10 MG tablet Take 10 mg by mouth daily.   dolutegravir-lamiVUDine (DOVATO) 50-300 MG tablet Take 1 tablet by mouth daily.   DULoxetine (CYMBALTA) 30 MG capsule Take 30 mg by mouth 2 (two) times daily.   ezetimibe (ZETIA) 10 MG tablet Take 1 tablet (10 mg total) by mouth daily.   famotidine (PEPCID) 20 MG tablet Take 20 mg by mouth 2 (two) times daily.   fluticasone (FLONASE) 50 MCG/ACT nasal spray Place 2 sprays into both nostrils daily as needed for rhinitis.   furosemide (LASIX) 20 MG tablet Take 20 mg by mouth daily. Pt says PCP told him to stop med til after this office visit   loratadine (CLARITIN) 10 MG tablet Take 10 mg by mouth daily.   metoprolol succinate (TOPROL-XL) 25 MG 24 hr tablet Take 0.5 tablets (12.5 mg total) by mouth daily.    oxybutynin (DITROPAN) 5 MG tablet Take 5 mg by mouth in the morning and at bedtime.   pantoprazole (PROTONIX) 40 MG tablet Take 1 tablet (40 mg total) by mouth daily as needed.   risperiDONE (RISPERDAL) 1 MG tablet Take 1.5 mg by mouth at bedtime.   spironolactone (ALDACTONE) 25 MG tablet TAKE ONE-HALF TABLET (12.5 MG ) EVERY THIRD DAY.   Tretinoin, Facial Wrinkles, 0.05 % CREA Apply topically.     Allergies:   Iodine, Lisinopril, Ioxaglate, and Ivp dye [iodinated contrast media]   Social History   Tobacco Use   Smoking status: Never   Smokeless tobacco: Never  Vaping Use   Vaping status: Never Used  Substance Use Topics   Alcohol use: No   Drug use: No     Family Hx: The patient's family history includes Heart disease in his mother. There is no history of Esophageal cancer or Colon cancer.  ROS:   Please see the history of present illness.    All other systems are reviewed and are negative.   Prior CV studies:   The following studies were reviewed today: Comprehensive ICD device check in  the office today, including active testing of capture threshold.  Labs/Other Tests and Data Reviewed:    EKG: ECG ordered today is similar to previous tracings and shows sinus rhythm, right superior axis deviation, poor R wave progression, T wave inversion leads V4-V6, QTc was normal at 391 ms.   Recent Labs: 04/21/2022: ALT 20; Hemoglobin 13.7; Platelets 128.0 02/03/2023: BUN 11; Creatinine, Ser 1.51; Potassium 4.3; Sodium 139  BNP was 321  Recent Lipid Panel Lab Results  Component Value Date/Time   CHOL 82 (L) 06/02/2021 08:34 AM   TRIG 43 06/02/2021 08:34 AM   HDL 33 (L) 06/02/2021 08:34 AM   CHOLHDL 2.5 06/02/2021 08:34 AM   CHOLHDL 3.9 09/05/2015 08:48 AM   LDLCALC 37 06/02/2021 08:34 AM    Wt Readings from Last 3 Encounters:  03/08/23 189 lb 12.8 oz (86.1 kg)  03/07/23 184 lb 6.4 oz (83.6 kg)  02/03/23 188 lb (85.3 kg)     Objective:    Vital Signs:  BP 106/68 (BP  Location: Left Arm, Patient Position: Sitting, Cuff Size: Large)   Pulse 74   Ht 5' 10.5" (1.791 m)   Wt 189 lb 12.8 oz (86.1 kg)   SpO2 99%   BMI 26.85 kg/m     General: Alert, oriented x3, no distress, healthy ICD site left subclavian area. Head: no evidence of trauma, PERRL, EOMI, no exophtalmos or lid lag, no myxedema, no xanthelasma; normal ears, nose and oropharynx Neck: normal jugular venous pulsations and no hepatojugular reflux; brisk carotid pulses without delay and no carotid bruits Chest: clear to auscultation, no signs of consolidation by percussion or palpation, normal fremitus, symmetrical and full respiratory excursions Cardiovascular: normal position and quality of the apical impulse, regular rhythm, normal first and second heart sounds, no murmurs, rubs or gallops Abdomen: no tenderness or distention, no masses by palpation, no abnormal pulsatility or arterial bruits, normal bowel sounds, no hepatosplenomegaly Extremities: no clubbing, cyanosis or edema; 2+ radial, ulnar and brachial pulses bilaterally; 2+ right femoral, posterior tibial and dorsalis pedis pulses; 2+ left femoral, posterior tibial and dorsalis pedis pulses; no subclavian or femoral bruits Neurological: grossly nonfocal Psych: Normal mood and affect   ASSESSMENT & PLAN:    1. VT (ventricular tachycardia) (HCC)   2. Chronic combined systolic and diastolic heart failure (HCC)   3. Coronary artery disease involving native coronary artery of native heart without angina pectoris   4. ICD (implantable cardioverter-defibrillator) in place   5. Hyperlipidemia LDL goal <70   6. Asymptomatic HIV infection, with no history of HIV-related illness (HCC)      VT: Has had 2 previous interventions for extremely rapid monomorphic VT/VF, but nothing in the last 15 months.  Not even episode of nonsustained VT.  Only tolerates a very low-dose of beta-blocker.  Avoid abrupt discontinuation of the beta-blocker. CHF: NYHA  functional class I and euvolemic without loop diuretics, only able to tolerate very low doses of heart failure medications due to his blood pressure. CAD: Asymptomatic on aspirin, metoprolol, statin. ICD: Normal device check today. HLP: Target LDL less than 70.  Labs are checked at the [facility].  Have asked provide [place] a copy. HIV: Reports that he remains viral load undetectable.     Medication Adjustments/Labs and Tests Ordered: Current medicines are reviewed at length with the patient today.  Concerns regarding medicines are outlined above.   Tests Ordered: No orders of the defined types were placed in this encounter.   Medication Changes: No orders  of the defined types were placed in this encounter.   Disposition:  Follow up  Remote downloads every 3 months and office visit in a year.  Signed, Thurmon Fair, MD  03/08/2023 9:56 AM    [facility]

## 2023-04-07 ENCOUNTER — Telehealth: Payer: Self-pay | Admitting: Cardiovascular Disease

## 2023-04-07 NOTE — Telephone Encounter (Signed)
Patient is calling back. Please advise  

## 2023-04-07 NOTE — Telephone Encounter (Signed)
Returned pt call. Pt says his BP was at 8 am before his medication it was 68/68 HR 70, he took it again at 2:00pm it was 95/65 HR 64 after medicaiton. BP yesterday was 93/64 HR 70 before medication. Pt states he only takes his medication if his BP is over 100. No chest pain or pressure, lightheadedness, dizziness or HA. No SOB or blurred visio or weakness. Pt does state he sleeps more than he used. Informed pt this information will be sent to the provider for recommendation and he verbalized understanding.

## 2023-04-07 NOTE — Telephone Encounter (Signed)
Pt c/o BP issue: STAT if pt c/o blurred vision, one-sided weakness or slurred speech  1. What are your last 5 BP readings? 98/68  2. Are you having any other symptoms (ex. Dizziness, headache, blurred vision, passed out)? No  3. What is your BP issue? Pt is concerned about his bp and is requesting a callback. Please advise

## 2023-04-08 ENCOUNTER — Ambulatory Visit (INDEPENDENT_AMBULATORY_CARE_PROVIDER_SITE_OTHER): Payer: Medicare PPO

## 2023-04-08 DIAGNOSIS — I255 Ischemic cardiomyopathy: Secondary | ICD-10-CM

## 2023-04-08 LAB — CUP PACEART REMOTE DEVICE CHECK
Battery Remaining Longevity: 28 mo
Battery Remaining Percentage: 25 %
Battery Voltage: 2.93 V
Brady Statistic RV Percent Paced: 1 %
Date Time Interrogation Session: 20240830024745
HighPow Impedance: 68 Ohm
HighPow Impedance: 68 Ohm
Implantable Lead Connection Status: 753985
Implantable Lead Implant Date: 20160202
Implantable Lead Location: 753860
Implantable Pulse Generator Implant Date: 20160202
Lead Channel Impedance Value: 360 Ohm
Lead Channel Pacing Threshold Amplitude: 1.25 V
Lead Channel Pacing Threshold Pulse Width: 0.5 ms
Lead Channel Sensing Intrinsic Amplitude: 10.7 mV
Lead Channel Setting Pacing Amplitude: 2.5 V
Lead Channel Setting Pacing Pulse Width: 0.5 ms
Lead Channel Setting Sensing Sensitivity: 0.5 mV
Pulse Gen Serial Number: 7229147

## 2023-04-08 NOTE — Telephone Encounter (Signed)
With these blood pressure levels, I think we probably just have to simply hold the Toprol until blood pressure stabilizes up.  Give it a couple weeks and then try again.  If pressures are still down then it simply means we probably would not build to use it.

## 2023-04-11 ENCOUNTER — Telehealth: Payer: Self-pay | Admitting: Home Health

## 2023-04-11 NOTE — Telephone Encounter (Signed)
Patient called after hour line, states he was given instruction to stop all blood thinner, but took ASA Sat and suppose to have colonoscopy tomorrow. Advised the patient to call his GI doctor for advice, typically 81mg  ASA for CAD is ok to continue before colonoscopy, unless his GI team advice otherwise.

## 2023-04-12 NOTE — Progress Notes (Signed)
Remote ICD transmission.   

## 2023-04-12 NOTE — Telephone Encounter (Signed)
Returned call to patient with Dr. Elissa Hefty recommendation. Goes straight to VM. LVM to call office back

## 2023-04-13 NOTE — Telephone Encounter (Signed)
Returned call to pt attempt 2. LVM to call back

## 2023-04-14 NOTE — Telephone Encounter (Signed)
Returned call to pt attempt. Advised pt of Dr. Elissa Hefty recommendation. Pt. Verbalized understanding.

## 2023-04-15 ENCOUNTER — Ambulatory Visit: Payer: Medicare Other

## 2023-06-08 ENCOUNTER — Telehealth: Payer: Self-pay | Admitting: Cardiovascular Disease

## 2023-06-08 NOTE — Telephone Encounter (Signed)
Please advise 

## 2023-06-08 NOTE — Telephone Encounter (Signed)
Spoke with patient who report that he has been having issues with low BP. He hasn't been keeping readings of his BP regularly but did check it when he felt shaky. He report he was standing last night and his legs felt shaky and gave away causing him to go down to the floor. He did not pass out and was not feeling dizzy or lightheaded. He deny chest pain, SOB or blurred vision. He checked his BP at that time and it was 93 systolic but does not remember the diastolic. This morning his BP was 98/76 HR 71 prior to taking Jardiance and about 30 minutes later it was 103/68 HR 74. He does report taking all medications as instructed. He is not taking Metoprolol due to being told to hold medication but continue with Aldactone every 3rd day but has not taken it for today. Encourage him to eat, drink plenty of fluid and elevate his legs. Patient verbalized understanding and agree. Please advise.

## 2023-06-08 NOTE — Telephone Encounter (Signed)
Pt c/o BP issue: STAT if pt c/o blurred vision, one-sided weakness or slurred speech  1. What are your last 5 BP readings?  06/08/23 98/76 HR 71 (before meds)  103/68 HR 74 (30 mins after meds) 06/07/23 around 8 pm 93/not sure   2. Are you having any other symptoms (ex. Dizziness, headache, blurred vision, passed out)? Shaking & legs gave out when BP dropped to 93  3. What is your BP issue? BP dropped last night, unsure why. Patient reports the same thing occurred the Sunday before last at church with shaking and legs giving out.   Please advise.

## 2023-06-09 NOTE — Telephone Encounter (Signed)
Spoke to patient and message relayed. No questions at this time.

## 2023-06-09 NOTE — Telephone Encounter (Signed)
Please stop the spironolactone altogether. It may take a week or more for it to wear off completely. Stay well hydrated.

## 2023-06-28 ENCOUNTER — Telehealth: Payer: Self-pay | Admitting: Cardiovascular Disease

## 2023-06-28 NOTE — Telephone Encounter (Signed)
Pt c/o BP issue: STAT if pt c/o blurred vision, one-sided weakness or slurred speech  1. What are your last 5 BP readings? This morning at 7:30 it was  108/77 and pulse 85, now it is 99/71 and pulse is 87  2. Are you having any other symptoms (ex. Dizziness, headache, blurred vision, passed out)?   3. What is your BP issue? Patient said he is afraid to take his blood pressure is so low

## 2023-06-28 NOTE — Telephone Encounter (Signed)
Patient is calling with concerns of his BP being low. Patient report BP this morning. 108/77 HR 85 and 99/71 HR 87. He did not take medication this morning due to his concerns of BP going lower. Patient told to monitor BP, drink plenty of fluid and elevate feet. Patient verbalized understanding and agree. He stated he will take his medications later.

## 2023-06-28 NOTE — Telephone Encounter (Signed)
Called and spoke to patient to relay below message. Patient stated he is no longer taking Metoprolol and is on Jardiance. Patient will take Jardiance once SBP > 105. Patient verbalized understanding and agree. No questions at this time.     Can skip the furosemide altogether today and take just half of the metoprolol succinate 25 mg tablet once SBP >105.

## 2023-06-28 NOTE — Telephone Encounter (Signed)
That is correct advice. Can skip the furosemide altogether today and take just half of the metoprolol succinate 25 mg tablet once SBP >105.

## 2023-06-29 ENCOUNTER — Telehealth: Payer: Self-pay | Admitting: Cardiovascular Disease

## 2023-06-29 NOTE — Telephone Encounter (Signed)
Patient identification verified by 2 forms. Marilynn Rail, RN    Called and spoke to patient  Patient states:   -stopped taking furosemide 1 month ago due to feeling weak   -no longer feeling weak since discontinuing   -continues to have low BP   -BP this morning: 92/69 HR 78   -is drinking a lot of water to stay hydrated   -BP is usually low most days when he wakes up, wait for it to increase to take medication   -Medications: atorvastatin, ezetimibe, duloxetine, jardiance 12.5mg   -does not take metoprolol  Patient denies:   -lightheadedness/dizziness   -swelling in extremities  Informed patient message sent to Dr. Royann Shivers  Patient verbalized understanding, no questions at this time

## 2023-06-29 NOTE — Telephone Encounter (Signed)
None of his remaining medications have any direct impact on his blood pressure, but the Jardiance can make him dehydrated and less he is drinking enough water.  Please make sure that he is drinking plenty of water.

## 2023-06-29 NOTE — Telephone Encounter (Signed)
Patient identification verified by 2 forms. Marilynn Rail, RN    Called and spoke to patient  Relayed provider message below  Reviewed ED warning signs/precautions  Patient verbalized understanding, no questions at this time

## 2023-06-29 NOTE — Telephone Encounter (Signed)
Pt c/o medication issue:  1. Name of Medication: Furosemide  2. How are you currently taking this medication (dosage and times per day)?   3. Are you having a reaction (difficulty breathing--STAT)?   4. What is your medication issue? Patient say he can not take Furosemide. He says it makes his legs weak and they feel like they are going to give out on him

## 2023-07-08 ENCOUNTER — Ambulatory Visit (INDEPENDENT_AMBULATORY_CARE_PROVIDER_SITE_OTHER): Payer: Medicare PPO

## 2023-07-08 DIAGNOSIS — I255 Ischemic cardiomyopathy: Secondary | ICD-10-CM

## 2023-07-08 LAB — CUP PACEART REMOTE DEVICE CHECK
Battery Remaining Longevity: 26 mo
Battery Remaining Percentage: 24 %
Battery Voltage: 2.92 V
Brady Statistic RV Percent Paced: 1 %
Date Time Interrogation Session: 20241129020015
HighPow Impedance: 75 Ohm
HighPow Impedance: 75 Ohm
Implantable Lead Connection Status: 753985
Implantable Lead Implant Date: 20160202
Implantable Lead Location: 753860
Implantable Pulse Generator Implant Date: 20160202
Lead Channel Impedance Value: 360 Ohm
Lead Channel Pacing Threshold Amplitude: 1.25 V
Lead Channel Pacing Threshold Pulse Width: 0.5 ms
Lead Channel Sensing Intrinsic Amplitude: 10.2 mV
Lead Channel Setting Pacing Amplitude: 2.5 V
Lead Channel Setting Pacing Pulse Width: 0.5 ms
Lead Channel Setting Sensing Sensitivity: 0.5 mV
Pulse Gen Serial Number: 7229147

## 2023-07-15 ENCOUNTER — Ambulatory Visit: Payer: Medicare Other

## 2023-07-28 NOTE — Progress Notes (Signed)
Remote ICD transmission.   

## 2023-09-12 ENCOUNTER — Telehealth: Payer: Self-pay | Admitting: *Deleted

## 2023-09-12 NOTE — Telephone Encounter (Signed)
   Pre-operative Risk Assessment    Patient Name: Richard [name]  DOB: 08-May-1956 MRN: [id number]   Date of last office visit: 0730/24 Date of next office visit: NONE   Request for Surgical Clearance    Procedure:   CATARACT EXTRACTION BY PE, IOL - RIGHT  Date of Surgery:  Clearance 09/21/23                                Surgeon:  DR. Koleen Distance Surgeon's Group or Practice Name:  [facility] Phone number:  [phone] Fax number:  [phone]   Type of Clearance Requested:   - Medical    Type of Anesthesia:   IV SEDATION   Additional requests/questions:    Wilhemina Cash   09/12/2023, 11:13 AM

## 2023-09-12 NOTE — Telephone Encounter (Signed)
   Patient Name: Richard [name]  DOB: Sep 16, 1955 MRN: [id number]  Primary Cardiologist: Nicki Guadalajara, MD  Chart reviewed as part of pre-operative protocol coverage. Cataract extractions are recognized in guidelines as low risk surgeries that do not typically require specific preoperative testing or holding of blood thinner therapy. Therefore, given past medical history and time since last visit, based on ACC/AHA guidelines, Richard [name] would be at acceptable risk for the planned procedure without further cardiovascular testing.   I will route this recommendation to the requesting party via Epic fax function and remove from pre-op pool.  Please call with questions.  Ronney Asters, NP 09/12/2023, 1:08 PM

## 2023-10-07 ENCOUNTER — Ambulatory Visit (INDEPENDENT_AMBULATORY_CARE_PROVIDER_SITE_OTHER): Payer: Medicare (Managed Care)

## 2023-10-07 DIAGNOSIS — I255 Ischemic cardiomyopathy: Secondary | ICD-10-CM

## 2023-10-10 LAB — CUP PACEART REMOTE DEVICE CHECK
Battery Remaining Longevity: 26 mo
Battery Remaining Percentage: 24 %
Battery Voltage: 2.92 V
Brady Statistic RV Percent Paced: 1 %
Date Time Interrogation Session: 20250301021733
HighPow Impedance: 66 Ohm
HighPow Impedance: 66 Ohm
Implantable Lead Connection Status: 753985
Implantable Lead Implant Date: 20160202
Implantable Lead Location: 753860
Implantable Pulse Generator Implant Date: 20160202
Lead Channel Impedance Value: 350 Ohm
Lead Channel Pacing Threshold Amplitude: 1.25 V
Lead Channel Pacing Threshold Pulse Width: 0.5 ms
Lead Channel Sensing Intrinsic Amplitude: 9.7 mV
Lead Channel Setting Pacing Amplitude: 2.5 V
Lead Channel Setting Pacing Pulse Width: 0.5 ms
Lead Channel Setting Sensing Sensitivity: 0.5 mV
Pulse Gen Serial Number: 7229147

## 2023-10-14 ENCOUNTER — Ambulatory Visit: Payer: Medicare Other

## 2023-11-04 NOTE — Addendum Note (Signed)
 Addended by: Elease Etienne A on: 11/04/2023 09:16 AM   Modules accepted: Orders

## 2023-11-04 NOTE — Progress Notes (Signed)
 Remote ICD transmission.

## 2023-12-01 ENCOUNTER — Emergency Department (HOSPITAL_COMMUNITY): Payer: Medicare (Managed Care)

## 2023-12-01 ENCOUNTER — Emergency Department (HOSPITAL_COMMUNITY): Payer: Medicare (Managed Care) | Admitting: Anesthesiology

## 2023-12-01 ENCOUNTER — Other Ambulatory Visit: Payer: Self-pay

## 2023-12-01 ENCOUNTER — Inpatient Hospital Stay (HOSPITAL_COMMUNITY)
Admission: EM | Admit: 2023-12-01 | Discharge: 2023-12-06 | DRG: 024 | Disposition: A | Discharge status: Rehabilitation Facility | Payer: Medicare (Managed Care) | Attending: Neurology | Admitting: Neurology

## 2023-12-01 ENCOUNTER — Encounter (HOSPITAL_COMMUNITY)
Admission: EM | Disposition: A | Discharge status: Rehabilitation Facility | Payer: Self-pay | Source: Home / Self Care | Attending: Neurology

## 2023-12-01 DIAGNOSIS — I252 Old myocardial infarction: Secondary | ICD-10-CM

## 2023-12-01 DIAGNOSIS — N1832 Chronic kidney disease, stage 3b: Secondary | ICD-10-CM | POA: Diagnosis present

## 2023-12-01 DIAGNOSIS — I63411 Cerebral infarction due to embolism of right middle cerebral artery: Secondary | ICD-10-CM | POA: Diagnosis not present

## 2023-12-01 DIAGNOSIS — I6522 Occlusion and stenosis of left carotid artery: Secondary | ICD-10-CM | POA: Diagnosis present

## 2023-12-01 DIAGNOSIS — G8194 Hemiplegia, unspecified affecting left nondominant side: Secondary | ICD-10-CM | POA: Diagnosis present

## 2023-12-01 DIAGNOSIS — I5042 Chronic combined systolic (congestive) and diastolic (congestive) heart failure: Secondary | ICD-10-CM

## 2023-12-01 DIAGNOSIS — I129 Hypertensive chronic kidney disease with stage 1 through stage 4 chronic kidney disease, or unspecified chronic kidney disease: Secondary | ICD-10-CM | POA: Diagnosis present

## 2023-12-01 DIAGNOSIS — R29818 Other symptoms and signs involving the nervous system: Secondary | ICD-10-CM | POA: Diagnosis not present

## 2023-12-01 DIAGNOSIS — I63511 Cerebral infarction due to unspecified occlusion or stenosis of right middle cerebral artery: Secondary | ICD-10-CM

## 2023-12-01 DIAGNOSIS — Z8249 Family history of ischemic heart disease and other diseases of the circulatory system: Secondary | ICD-10-CM

## 2023-12-01 DIAGNOSIS — K219 Gastro-esophageal reflux disease without esophagitis: Secondary | ICD-10-CM | POA: Diagnosis present

## 2023-12-01 DIAGNOSIS — G8929 Other chronic pain: Secondary | ICD-10-CM | POA: Diagnosis not present

## 2023-12-01 DIAGNOSIS — R4781 Slurred speech: Secondary | ICD-10-CM | POA: Diagnosis not present

## 2023-12-01 DIAGNOSIS — I509 Heart failure, unspecified: Secondary | ICD-10-CM

## 2023-12-01 DIAGNOSIS — B20 Human immunodeficiency virus [HIV] disease: Secondary | ICD-10-CM | POA: Diagnosis not present

## 2023-12-01 DIAGNOSIS — R252 Cramp and spasm: Secondary | ICD-10-CM | POA: Diagnosis not present

## 2023-12-01 DIAGNOSIS — I5082 Biventricular heart failure: Secondary | ICD-10-CM | POA: Diagnosis present

## 2023-12-01 DIAGNOSIS — I6601 Occlusion and stenosis of right middle cerebral artery: Secondary | ICD-10-CM

## 2023-12-01 DIAGNOSIS — Z743 Need for continuous supervision: Secondary | ICD-10-CM | POA: Diagnosis not present

## 2023-12-01 DIAGNOSIS — Z950 Presence of cardiac pacemaker: Secondary | ICD-10-CM | POA: Diagnosis not present

## 2023-12-01 DIAGNOSIS — Z7984 Long term (current) use of oral hypoglycemic drugs: Secondary | ICD-10-CM | POA: Diagnosis not present

## 2023-12-01 DIAGNOSIS — I959 Hypotension, unspecified: Secondary | ICD-10-CM | POA: Diagnosis not present

## 2023-12-01 DIAGNOSIS — I10 Essential (primary) hypertension: Secondary | ICD-10-CM

## 2023-12-01 DIAGNOSIS — Z7982 Long term (current) use of aspirin: Secondary | ICD-10-CM | POA: Diagnosis not present

## 2023-12-01 DIAGNOSIS — I5022 Chronic systolic (congestive) heart failure: Secondary | ICD-10-CM | POA: Diagnosis not present

## 2023-12-01 DIAGNOSIS — I255 Ischemic cardiomyopathy: Secondary | ICD-10-CM | POA: Diagnosis not present

## 2023-12-01 DIAGNOSIS — I110 Hypertensive heart disease with heart failure: Secondary | ICD-10-CM

## 2023-12-01 DIAGNOSIS — I63311 Cerebral infarction due to thrombosis of right middle cerebral artery: Secondary | ICD-10-CM | POA: Diagnosis not present

## 2023-12-01 DIAGNOSIS — R471 Dysarthria and anarthria: Secondary | ICD-10-CM | POA: Diagnosis present

## 2023-12-01 DIAGNOSIS — M79652 Pain in left thigh: Secondary | ICD-10-CM | POA: Diagnosis not present

## 2023-12-01 DIAGNOSIS — J90 Pleural effusion, not elsewhere classified: Secondary | ICD-10-CM

## 2023-12-01 DIAGNOSIS — I69391 Dysphagia following cerebral infarction: Secondary | ICD-10-CM

## 2023-12-01 DIAGNOSIS — Z91041 Radiographic dye allergy status: Secondary | ICD-10-CM

## 2023-12-01 DIAGNOSIS — I5023 Acute on chronic systolic (congestive) heart failure: Secondary | ICD-10-CM | POA: Diagnosis not present

## 2023-12-01 DIAGNOSIS — Z95810 Presence of automatic (implantable) cardiac defibrillator: Secondary | ICD-10-CM

## 2023-12-01 DIAGNOSIS — I472 Ventricular tachycardia, unspecified: Secondary | ICD-10-CM | POA: Diagnosis not present

## 2023-12-01 DIAGNOSIS — I6389 Other cerebral infarction: Secondary | ICD-10-CM | POA: Diagnosis not present

## 2023-12-01 DIAGNOSIS — I5032 Chronic diastolic (congestive) heart failure: Secondary | ICD-10-CM | POA: Diagnosis present

## 2023-12-01 DIAGNOSIS — Z79899 Other long term (current) drug therapy: Secondary | ICD-10-CM

## 2023-12-01 DIAGNOSIS — F209 Schizophrenia, unspecified: Secondary | ICD-10-CM | POA: Diagnosis present

## 2023-12-01 DIAGNOSIS — R1310 Dysphagia, unspecified: Secondary | ICD-10-CM | POA: Diagnosis present

## 2023-12-01 DIAGNOSIS — I69354 Hemiplegia and hemiparesis following cerebral infarction affecting left non-dominant side: Secondary | ICD-10-CM

## 2023-12-01 DIAGNOSIS — Z95811 Presence of heart assist device: Secondary | ICD-10-CM | POA: Diagnosis not present

## 2023-12-01 DIAGNOSIS — R29810 Facial weakness: Secondary | ICD-10-CM | POA: Diagnosis not present

## 2023-12-01 DIAGNOSIS — I2510 Atherosclerotic heart disease of native coronary artery without angina pectoris: Secondary | ICD-10-CM

## 2023-12-01 DIAGNOSIS — R29711 NIHSS score 11: Secondary | ICD-10-CM | POA: Diagnosis not present

## 2023-12-01 DIAGNOSIS — D649 Anemia, unspecified: Secondary | ICD-10-CM | POA: Diagnosis not present

## 2023-12-01 DIAGNOSIS — Z21 Asymptomatic human immunodeficiency virus [HIV] infection status: Secondary | ICD-10-CM | POA: Diagnosis present

## 2023-12-01 DIAGNOSIS — E785 Hyperlipidemia, unspecified: Secondary | ICD-10-CM | POA: Diagnosis present

## 2023-12-01 DIAGNOSIS — R7989 Other specified abnormal findings of blood chemistry: Secondary | ICD-10-CM | POA: Diagnosis not present

## 2023-12-01 DIAGNOSIS — Z951 Presence of aortocoronary bypass graft: Secondary | ICD-10-CM | POA: Diagnosis not present

## 2023-12-01 DIAGNOSIS — K5900 Constipation, unspecified: Secondary | ICD-10-CM | POA: Diagnosis not present

## 2023-12-01 DIAGNOSIS — K5901 Slow transit constipation: Secondary | ICD-10-CM | POA: Diagnosis not present

## 2023-12-01 DIAGNOSIS — I639 Cerebral infarction, unspecified: Principal | ICD-10-CM

## 2023-12-01 DIAGNOSIS — F418 Other specified anxiety disorders: Secondary | ICD-10-CM | POA: Diagnosis not present

## 2023-12-01 DIAGNOSIS — I4891 Unspecified atrial fibrillation: Secondary | ICD-10-CM | POA: Diagnosis not present

## 2023-12-01 DIAGNOSIS — N1831 Chronic kidney disease, stage 3a: Secondary | ICD-10-CM | POA: Diagnosis not present

## 2023-12-01 DIAGNOSIS — I130 Hypertensive heart and chronic kidney disease with heart failure and stage 1 through stage 4 chronic kidney disease, or unspecified chronic kidney disease: Secondary | ICD-10-CM | POA: Diagnosis not present

## 2023-12-01 DIAGNOSIS — M545 Low back pain, unspecified: Secondary | ICD-10-CM | POA: Diagnosis not present

## 2023-12-01 DIAGNOSIS — R531 Weakness: Secondary | ICD-10-CM | POA: Diagnosis not present

## 2023-12-01 DIAGNOSIS — R519 Headache, unspecified: Secondary | ICD-10-CM | POA: Diagnosis not present

## 2023-12-01 DIAGNOSIS — I6523 Occlusion and stenosis of bilateral carotid arteries: Secondary | ICD-10-CM | POA: Diagnosis not present

## 2023-12-01 DIAGNOSIS — R29712 NIHSS score 12: Secondary | ICD-10-CM | POA: Diagnosis not present

## 2023-12-01 HISTORY — PX: IR US GUIDE VASC ACCESS RIGHT: IMG2390

## 2023-12-01 HISTORY — PX: RADIOLOGY WITH ANESTHESIA: SHX6223

## 2023-12-01 HISTORY — PX: IR CT HEAD LTD: IMG2386

## 2023-12-01 HISTORY — PX: IR PERCUTANEOUS ART THROMBECTOMY/INFUSION INTRACRANIAL INC DIAG ANGIO: IMG6087

## 2023-12-01 LAB — DIFFERENTIAL
Abs Immature Granulocytes: 0.01 10*3/uL (ref 0.00–0.07)
Basophils Absolute: 0 10*3/uL (ref 0.0–0.1)
Basophils Relative: 1 %
Eosinophils Absolute: 0.1 10*3/uL (ref 0.0–0.5)
Eosinophils Relative: 2 %
Immature Granulocytes: 0 %
Lymphocytes Relative: 23 %
Lymphs Abs: 1 10*3/uL (ref 0.7–4.0)
Monocytes Absolute: 0.4 10*3/uL (ref 0.1–1.0)
Monocytes Relative: 8 %
Neutro Abs: 2.9 10*3/uL (ref 1.7–7.7)
Neutrophils Relative %: 66 %

## 2023-12-01 LAB — COMPREHENSIVE METABOLIC PANEL WITH GFR
ALT: 36 U/L (ref 0–44)
AST: 25 U/L (ref 15–41)
Albumin: 4.1 g/dL (ref 3.5–5.0)
Alkaline Phosphatase: 54 U/L (ref 38–126)
Anion gap: 9 (ref 5–15)
BUN: 13 mg/dL (ref 8–23)
CO2: 23 mmol/L (ref 22–32)
Calcium: 9.2 mg/dL (ref 8.9–10.3)
Chloride: 106 mmol/L (ref 98–111)
Creatinine, Ser: 1.33 mg/dL — ABNORMAL HIGH (ref 0.61–1.24)
GFR, Estimated: 59 mL/min — ABNORMAL LOW (ref >60)
Glucose, Bld: 100 mg/dL — ABNORMAL HIGH (ref 70–99)
Potassium: 4.3 mmol/L (ref 3.5–5.1)
Sodium: 138 mmol/L (ref 135–145)
Total Bilirubin: 0.8 mg/dL (ref 0.0–1.2)
Total Protein: 7.6 g/dL (ref 6.5–8.1)

## 2023-12-01 LAB — CBC
HCT: 44.2 % (ref 39.0–52.0)
HCT: 41.1 % (ref 39.0–52.0)
Hemoglobin: 14.6 g/dL (ref 13.0–17.0)
Hemoglobin: 13.9 g/dL (ref 13.0–17.0)
MCH: 27.9 pg (ref 26.0–34.0)
MCH: 28.1 pg (ref 26.0–34.0)
MCHC: 33 g/dL (ref 30.0–36.0)
MCHC: 33.8 g/dL (ref 30.0–36.0)
MCV: 84.4 fL (ref 80.0–100.0)
MCV: 83 fL (ref 80.0–100.0)
Platelets: 122 10*3/uL — ABNORMAL LOW (ref 150–400)
Platelets: 153 10*3/uL (ref 150–400)
RBC: 5.24 MIL/uL (ref 4.22–5.81)
RBC: 4.95 MIL/uL (ref 4.22–5.81)
RDW: 15.2 % (ref 11.5–15.5)
RDW: 15.3 % (ref 11.5–15.5)
WBC: 4.4 10*3/uL (ref 4.0–10.5)
WBC: 6.7 10*3/uL (ref 4.0–10.5)
nRBC: 0 % (ref 0.0–0.2)
nRBC: 0 % (ref 0.0–0.2)

## 2023-12-01 LAB — CREATININE, SERUM
Creatinine, Ser: 1.34 mg/dL — ABNORMAL HIGH (ref 0.61–1.24)
GFR, Estimated: 58 mL/min — ABNORMAL LOW (ref >60)

## 2023-12-01 LAB — I-STAT CHEM 8, ED
BUN: 15 mg/dL (ref 8–23)
Calcium, Ion: 1.07 mmol/L — ABNORMAL LOW (ref 1.15–1.40)
Chloride: 105 mmol/L (ref 98–111)
Creatinine, Ser: 1.5 mg/dL — ABNORMAL HIGH (ref 0.61–1.24)
Glucose, Bld: 96 mg/dL (ref 70–99)
HCT: 45 % (ref 39.0–52.0)
Hemoglobin: 15.3 g/dL (ref 13.0–17.0)
Potassium: 4.3 mmol/L (ref 3.5–5.1)
Sodium: 140 mmol/L (ref 135–145)
TCO2: 26 mmol/L (ref 22–32)

## 2023-12-01 LAB — PROTIME-INR
INR: 1.1 (ref 0.8–1.2)
Prothrombin Time: 14.8 s (ref 11.4–15.2)

## 2023-12-01 LAB — HEMOGLOBIN A1C
Hgb A1c MFr Bld: 5.6 % (ref 4.8–5.6)
Mean Plasma Glucose: 114.02 mg/dL

## 2023-12-01 LAB — ETHANOL: Alcohol, Ethyl (B): <15 mg/dL (ref <15)

## 2023-12-01 LAB — MRSA NEXT GEN BY PCR, NASAL: MRSA by PCR Next Gen: NOT DETECTED

## 2023-12-01 LAB — CBG MONITORING, ED: Glucose-Capillary: 99 mg/dL (ref 70–99)

## 2023-12-01 LAB — SARS CORONAVIRUS 2 BY RT PCR: SARS Coronavirus 2 by RT PCR: NEGATIVE

## 2023-12-01 LAB — APTT: aPTT: 28 s (ref 24–36)

## 2023-12-01 SURGERY — RADIOLOGY WITH ANESTHESIA
Anesthesia: General

## 2023-12-01 MED ORDER — SODIUM CHLORIDE 0.9 % IV SOLN: 250.0000 mL | INTRAVENOUS | Status: AC

## 2023-12-01 MED ORDER — FAMOTIDINE IN NACL 20-0.9 MG/50ML-% IV SOLN: 20.0000 mg | Freq: Once | INTRAVENOUS | Status: AC

## 2023-12-01 MED ORDER — CHLORHEXIDINE GLUCONATE CLOTH 2 % EX PADS
6.0000 | MEDICATED_PAD | Freq: Every day | CUTANEOUS | Status: DC
  Administered 2023-12-01 – 2023-12-03 (×3): 6 via TOPICAL

## 2023-12-01 MED ORDER — ROCURONIUM BROMIDE 10 MG/ML (PF) SYRINGE
PREFILLED_SYRINGE | INTRAVENOUS | Status: DC | PRN
  Administered 2023-12-01: 30 mg via INTRAVENOUS

## 2023-12-01 MED ORDER — IOHEXOL 300 MG/ML  SOLN
150.0000 mL | Freq: Once | INTRAMUSCULAR | Status: AC | PRN
  Administered 2023-12-01: 80 mL via INTRA_ARTERIAL

## 2023-12-01 MED ORDER — FAMOTIDINE IN NACL 20-0.9 MG/50ML-% IV SOLN
INTRAVENOUS | Status: AC
  Administered 2023-12-01: 20 mg via INTRAVENOUS
  Filled 2023-12-01: qty 50

## 2023-12-01 MED ORDER — ACETAMINOPHEN 650 MG RE SUPP: 650.0000 mg | RECTAL | Status: DC | PRN

## 2023-12-01 MED ORDER — ENOXAPARIN SODIUM 40 MG/0.4ML IJ SOSY
40.0000 mg | PREFILLED_SYRINGE | INTRAMUSCULAR | Status: DC
  Administered 2023-12-02 – 2023-12-06 (×5): 40 mg via SUBCUTANEOUS
  Filled 2023-12-01 (×5): qty 0.4

## 2023-12-01 MED ORDER — METHYLPREDNISOLONE SODIUM SUCC 125 MG IJ SOLR
INTRAMUSCULAR | Status: AC
  Administered 2023-12-01: 125 mg via INTRAVENOUS
  Filled 2023-12-01: qty 2

## 2023-12-01 MED ORDER — IOHEXOL 350 MG/ML SOLN
100.0000 mL | Freq: Once | INTRAVENOUS | Status: AC | PRN
  Administered 2023-12-01: 100 mL via INTRAVENOUS

## 2023-12-01 MED ORDER — FAMOTIDINE 20 MG PO TABS
20.0000 mg | ORAL_TABLET | Freq: Every day | ORAL | Status: DC
  Administered 2023-12-02 – 2023-12-06 (×5): 20 mg via ORAL
  Filled 2023-12-01 (×5): qty 1

## 2023-12-01 MED ORDER — SENNOSIDES-DOCUSATE SODIUM 8.6-50 MG PO TABS
1.0000 | ORAL_TABLET | Freq: Every evening | ORAL | Status: DC | PRN
  Administered 2023-12-04: 1 via ORAL
  Filled 2023-12-01: qty 1

## 2023-12-01 MED ORDER — PHENYLEPHRINE HCL-NACL 20-0.9 MG/250ML-% IV SOLN
25.0000 ug/min | INTRAVENOUS | Status: DC
  Administered 2023-12-01: 120 ug/min via INTRAVENOUS
  Filled 2023-12-01: qty 250

## 2023-12-01 MED ORDER — ACETAMINOPHEN 160 MG/5ML PO SOLN: 650.0000 mg | ORAL | Status: DC | PRN

## 2023-12-01 MED ORDER — ORAL CARE MOUTH RINSE: 15.0000 mL | OROMUCOSAL | Status: DC | PRN

## 2023-12-01 MED ORDER — DIPHENHYDRAMINE HCL 50 MG/ML IJ SOLN
INTRAMUSCULAR | Status: AC
  Administered 2023-12-01: 50 mg via INTRAVENOUS
  Filled 2023-12-01: qty 1

## 2023-12-01 MED ORDER — ACETAMINOPHEN 325 MG PO TABS
650.0000 mg | ORAL_TABLET | ORAL | Status: DC | PRN
  Administered 2023-12-02 – 2023-12-06 (×10): 650 mg via ORAL
  Filled 2023-12-01 (×10): qty 2

## 2023-12-01 MED ORDER — SODIUM CHLORIDE 0.9 % IV BOLUS
500.0000 mL | Freq: Once | INTRAVENOUS | Status: AC
  Administered 2023-12-01: 500 mL via INTRAVENOUS

## 2023-12-01 MED ORDER — HEPARIN SODIUM (PORCINE) 1000 UNIT/ML IJ SOLN
INTRAMUSCULAR | Status: DC | PRN
  Administered 2023-12-01: 3000 [IU] via INTRAVENOUS

## 2023-12-01 MED ORDER — PHENYLEPHRINE HCL-NACL 20-0.9 MG/250ML-% IV SOLN: 0.0000 ug/min | INTRAVENOUS | Status: DC

## 2023-12-01 MED ORDER — DOLUTEGRAVIR-LAMIVUDINE 50-300 MG PO TABS
1.0000 | ORAL_TABLET | Freq: Every day | ORAL | Status: DC
  Administered 2023-12-02 – 2023-12-06 (×5): 1 via ORAL
  Filled 2023-12-01 (×6): qty 1

## 2023-12-01 MED ORDER — PHENYLEPHRINE HCL-NACL 20-0.9 MG/250ML-% IV SOLN
INTRAVENOUS | Status: DC | PRN
Start: 2023-12-01 — End: 2023-12-01
  Administered 2023-12-01: 50 ug/min via INTRAVENOUS

## 2023-12-01 MED ORDER — SODIUM CHLORIDE 0.9 % IV SOLN: INTRAVENOUS | Status: AC

## 2023-12-01 MED ORDER — SODIUM CHLORIDE 0.9 % IV SOLN
250.0000 mL | INTRAVENOUS | Status: AC
Start: 2023-12-01 — End: 2023-12-02

## 2023-12-01 MED ORDER — SODIUM CHLORIDE 0.9 % IV BOLUS: 250.0000 mL | INTRAVENOUS | Status: AC | PRN

## 2023-12-01 MED ORDER — SODIUM CHLORIDE 0.9% FLUSH: 3.0000 mL | Freq: Once | INTRAVENOUS | Status: DC

## 2023-12-01 MED ORDER — STROKE: EARLY STAGES OF RECOVERY BOOK
Freq: Once | Status: AC
  Filled 2023-12-01: qty 1

## 2023-12-01 MED ORDER — ONDANSETRON HCL 4 MG/2ML IJ SOLN
INTRAMUSCULAR | Status: DC | PRN
  Administered 2023-12-01: 4 mg via INTRAVENOUS

## 2023-12-01 MED ORDER — NOREPINEPHRINE 4 MG/250ML-% IV SOLN
0.0000 ug/min | INTRAVENOUS | Status: DC
  Administered 2023-12-01: 2 ug/min via INTRAVENOUS
  Administered 2023-12-02: 7 ug/min via INTRAVENOUS
  Filled 2023-12-01 (×2): qty 250

## 2023-12-01 MED ORDER — PROPOFOL 10 MG/ML IV BOLUS
INTRAVENOUS | Status: DC | PRN
Start: 2023-12-01 — End: 2023-12-01
  Administered 2023-12-01: 70 mg via INTRAVENOUS

## 2023-12-01 MED ORDER — DIPHENHYDRAMINE HCL 50 MG/ML IJ SOLN: 50.0000 mg | Freq: Once | INTRAMUSCULAR | Status: AC

## 2023-12-01 MED ORDER — SUCCINYLCHOLINE CHLORIDE 200 MG/10ML IV SOSY
PREFILLED_SYRINGE | INTRAVENOUS | Status: DC | PRN
  Administered 2023-12-01: 120 mg via INTRAVENOUS

## 2023-12-01 MED ORDER — LIDOCAINE 2% (20 MG/ML) 5 ML SYRINGE
INTRAMUSCULAR | Status: DC | PRN
Start: 2023-12-01 — End: 2023-12-01
  Administered 2023-12-01: 80 mg via INTRAVENOUS

## 2023-12-01 MED ORDER — METHYLPREDNISOLONE SODIUM SUCC 125 MG IJ SOLR: 125.0000 mg | Freq: Once | INTRAMUSCULAR | Status: AC

## 2023-12-01 MED ORDER — SUGAMMADEX SODIUM 200 MG/2ML IV SOLN
INTRAVENOUS | Status: DC | PRN
  Administered 2023-12-01 (×2): 100 mg via INTRAVENOUS

## 2023-12-01 NOTE — Progress Notes (Signed)
 PT Cancellation Note  Patient Details Name: Richard [name] MRN: [id number] DOB: 1956-01-14   Cancelled Treatment:    Reason Eval/Treat Not Completed: (P) Other (comment). Discussed with RN that pt just had IR procedure a few hours ago. Will plan to hold off today and follow-up tomorrow.   Vernida Goodie, PT, DPT Acute Rehabilitation Services  Office: [phone]    Richard [name] 12/01/2023, 3:37 PM

## 2023-12-01 NOTE — H&P (Addendum)
 NEUROLOGY H&P NOTE   Date of service: December 01, 2023 Patient Name: Richard [name] MRN:  [id number] OB:  Jan 01, 1956 Chief Complaint: "left sided weakness"  History of Present Illness  Richard [name] is a 68 y.o. 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  Patient was last seen in his usual state of health last night at 6 PM when his wife came to the hospital to see a family member.  This morning, she called her husband and he initially did not answer but when she called again, she noticed that his speech was slurred and he asked her to come home because something was not right.  When his wife got home, she found the patient on the floor with left-sided weakness, left-sided facial droop, and slurred speech with complaints of a severe headache prompting EMS activation.  On EMS arrival, patient's symptoms were persistent and a code stroke was activated.  Last known well: 1800 11/30/23 Modified rankin score: 0 IV Thrombolysis: No, presented outside of the thrombolytic therapy time window Thrombectomy: Yes, vessel imaging revealed right MCA occlusion with intraluminal thrombus and a code IR was subsequently activated at 1105 on 12/01/23 after discussing risks, benefits, and alternatives with patient and patient's wife and obtaining consent for IR.   NIHSS components Score: Comment  1a Level of Conscious 0[x]  1[]  2[]  3[]      1b LOC Questions 0[x]  1[]  2[]       1c LOC Commands 0[x]  1[]  2[]       2 Best Gaze 0[x]  1[]  2[]       3 Visual 0[x]  1[]  2[]  3[]      4 Facial Palsy 0[]  1[x]  2[]  3[]     Left face  5a Motor Arm - left 0[]  1[]  2[]  3[x]  4[]  UN[]    5b Motor Arm - Right 0[x]  1[]  2[]  3[]  4[]  UN[]    6a Motor Leg - Left 0[]  1[]   2[]  3[x]  4[]  UN[]    6b Motor Leg - Right 0[x]  1[]  2[]  3[]  4[]  UN[]    7 Limb Ataxia 0[x]  1[]  2[]  3[]  UN[]     8 Sensory 0[]  1[]  2[x]  UN[]     Left upper extremity  9 Best Language 0[x]  1[]  2[]  3[]      10 Dysarthria 0[]  1[x]  2[]  UN[]      11 Extinct. and Inattention 0[]  1[x]  2[]       TOTAL:  11    ROS  Comprehensive ROS performed and pertinent positives documented in the HPI  Past History   Past Medical History:  Diagnosis Date   Coronary artery disease    a. cath 04/02/2014 3v dx 80-90% in D1, 80-90% mid LAD, 95% apical LAD, 95% distal inferior LV branch of LCx, total occlusion of prox RCA  b). Cath 04/03/2014 DES to mid LAD, DES x3 to prox D1, DAPT indefinitely. LCx lesion untreated   GERD (gastroesophageal reflux disease)    HIV (human immunodeficiency virus infection) (HCC)    Hypertension    Ischemic cardiomyopathy 08/09/2013   EF 20-25 percent by echo in 04/2014, s/p [employer] [place] model [id number] serial number [id number]   Stroke [facility])    Past Surgical History:  Procedure Laterality Date   CARDIAC CATHETERIZATION  04/02/2014   DR Loetta Ringer   ESOPHAGOGASTRODUODENOSCOPY (EGD) WITH PROPOFOL  N/A 09/08/2018  Procedure: ESOPHAGOGASTRODUODENOSCOPY (EGD) WITH PROPOFOL ;  Surgeon: Alvis Jourdain, MD;  Location: [facility] END[facility]COPY;  Service: Endoscopy;  Laterality: N/A;   IMPLANTABLE CARDIOVERTER DEFIBRILLATOR IMPLANT N/A 09/10/2014   Procedure: IMPLANTABLE CARDIOVERTER DEFIBRILLATOR IMPLANT;  Surgeon: Luana Rumple, MD;  [facility] [place] model [id number] serial number [id number]   LEFT AND RIGHT HEART CATHETERIZATION WITH CORONARY ANGIOGRAM N/A 04/02/2014   Procedure: LEFT AND RIGHT HEART CATHETERIZATION WITH CORONARY ANGIOGRAM;  Surgeon: Millicent Ally, MD;  Location: [facility] CATH LAB;  Service: Cardiovascular;  Laterality: N/A;   MEDIAL COLLATERAL LIGAMENT REPAIR, KNEE Right 08/25/2015   Procedure: RIGHT THUMB RADICAL COLLATERAL LIGAMENT REPAIR;  Surgeon: Florida Hurter, MD;  Location: [facility] OR;   Service: Orthopedics;  Laterality: Right;   PERCUTANEOUS CORONARY STENT INTERVENTION (PCI-S) N/A 04/03/2014   Procedure: PERCUTANEOUS CORONARY STENT INTERVENTION (PCI-S);  Surgeon: Peter M Swaziland, MD;  Location: [facility] CATH LAB;  Service: Cardiovascular;  Laterality: N/A;   SAVORY DILATION N/A 09/08/2018   Procedure: SAVORY DILATION;  Surgeon: Alvis Jourdain, MD;  Location: [facility];  Service: Endoscopy;  Laterality: N/A;   Family History  Problem Relation Age of Onset   Heart disease Mother    Esophageal cancer Neg Hx    Colon cancer Neg Hx    Social History   Socioeconomic History   Marital status: Married    Spouse name: Not on file   Number of children: 1   Years of education: Not on file   Highest education level: Not on file  Occupational History   Occupation: disable Vet  Tobacco Use   Smoking status: Never   Smokeless tobacco: Never  Vaping Use   Vaping status: Never Used  Substance and Sexual Activity   Alcohol use: No   Drug use: No   Sexual activity: Not on file  Other Topics Concern   Not on file  Social History Narrative   Not on file   Social Drivers of Health   Financial Resource Strain: Not on file  Food Insecurity: Not on file  Transportation Needs: Not on file  Physical Activity: Not on file  Stress: Not on file  Social Connections: Not on file   Allergies  Allergen Reactions   Iodine    Lisinopril  Cough   Ioxaglate Hives   Ivp Dye [Iodinated Contrast Media] Hives   Medications   Current Facility-Administered Medications:    diphenhydrAMINE  (BENADRYL ) 50 MG/ML injection, , , ,    famotidine  (PEPCID ) 20-0.9 MG/50ML-% IVPB, , , ,    famotidine  (PEPCID ) IVPB 20 mg premix, 20 mg, Intravenous, Once, Trish Furl, MD   methylPREDNISolone  sodium succinate (SOLU-MEDROL ) 125 mg/2 mL injection 125 mg, 125 mg, Intravenous, Once, Trish Furl, MD   methylPREDNISolone  sodium succinate (SOLU-MEDROL ) 125 mg/2 mL injection, , , ,    sodium chloride  flush (NS)  0.9 % injection 3 mL, 3 mL, Intravenous, Once, Trish Furl, MD  Current Outpatient Medications:    aspirin  81 MG chewable tablet, Chew 81 mg by mouth daily., Disp: , Rfl:    atorvastatin  (LIPITOR) 40 MG tablet, Take 1 tablet by mouth once daily, Disp: 90 tablet, Rfl: 3   carboxymethylcellulose (REFRESH PLUS) 0.5 % SOLN, INSTILL 1 DROP IN BOTH EYES FOUR TIMES A DAY, Disp: , Rfl:    cetirizine (ZYRTEC) 10 MG tablet, Take 10 mg by mouth daily., Disp: , Rfl:    dolutegravir -lamiVUDine  (DOVATO ) 50-300 MG tablet, Take 1 tablet by mouth daily., Disp: , Rfl:    DULoxetine (CYMBALTA) 30 MG capsule, Take 30  mg by mouth 2 (two) times daily., Disp: , Rfl:    empagliflozin  (JARDIANCE ) 10 MG TABS tablet, Take 1 tablet (10 mg total) by mouth daily. (Patient not taking: Reported on 03/08/2023), Disp: 30 tablet, Rfl: 5   ezetimibe  (ZETIA ) 10 MG tablet, Take 1 tablet (10 mg total) by mouth daily., Disp: 90 tablet, Rfl: 2   famotidine  (PEPCID ) 20 MG tablet, Take 20 mg by mouth 2 (two) times daily., Disp: , Rfl:    fluticasone (FLONASE) 50 MCG/ACT nasal spray, Place 2 sprays into both nostrils daily as needed for rhinitis., Disp: , Rfl:    furosemide  (LASIX ) 20 MG tablet, Take 20 mg by mouth daily. Pt says PCP told him to stop med til after this office visit, Disp: , Rfl:    loratadine  (CLARITIN ) 10 MG tablet, Take 10 mg by mouth daily., Disp: , Rfl:    metoprolol  succinate (TOPROL -XL) 25 MG 24 hr tablet, Take 0.5 tablets (12.5 mg total) by mouth daily., Disp: , Rfl:    Multiple Vitamins-Minerals (CENTRUM SILVER PO), Take 2 tablets by mouth daily. (Patient not taking: Reported on 03/08/2023), Disp: , Rfl:    oxybutynin (DITROPAN) 5 MG tablet, Take 5 mg by mouth in the morning and at bedtime., Disp: , Rfl:    pantoprazole  (PROTONIX ) 40 MG tablet, Take 1 tablet (40 mg total) by mouth daily as needed., Disp: 30 tablet, Rfl: 0   risperiDONE  (RISPERDAL ) 1 MG tablet, Take 1.5 mg by mouth at bedtime., Disp: , Rfl:     tacrolimus (PROTOPIC) 0.1 % ointment, Apply topically 2 (two) times daily. (Patient not taking: Reported on 03/08/2023), Disp: , Rfl:    Tretinoin, Facial Wrinkles, 0.05 % CREA, Apply topically., Disp: , Rfl:    Vitals   Vitals:   12/01/23 1000 12/01/23 1035 12/01/23 1051  BP:  116/79 (!) 125/90  Pulse:  72   SpO2:  99%   Weight: 78.6 kg    Height: 5\' 10"  (1.778 m)      Body mass index is 24.86 kg/m.  Physical Exam   Constitutional: Appears well-developed and well-nourished.  Psych: Affect appropriate to situation.  Patient is calm and cooperative with exam. Eyes: No scleral injection.  HENT: No OP obstruction.  Head: Normocephalic.  Cardiovascular: Normal rate and regular rhythm.  Respiratory: Effort normal, non-labored breathing on room air GI: Soft.  No distension. There is no tenderness.  Skin: WDI.   Neurologic Examination   Mental Status: Patient is awake, alert, oriented to person, place, month, year, and situation. Patient is able to give a clear and coherent history. Pt has dysarthria, but no aphasia. 6/6 on identification. Left sided neglect.  Cranial Nerves: II: Visual Fields are full. Pupils are equal, round, and reactive to light.   III,IV, VI: EOMI without ptosis or diploplia.  V: Facial sensation is intact but diminished on Left side.  VII:  Left facial droop is present  VIII: Hearing is intact to voice X: Palate elevates symmetrically XI: Shoulder shrug is symmetric. XII: Tongue protrudes midline without atrophy or fasciculations.   Motor: Tone is normal. Bulk is normal. Right upper and lower extremities elevate antigravity without vertical drift.  Left upper and lower extremities with limited movement without gravity.   Sensory: Sensation is impaired to light touch on the left hemibody with some sensory neglect on the left as well.   Plantars: Toes are upgoing bilaterally.   Cerebellar: FNF and HKS are intact on R. Unable to perform on the  left  Labs   CBC:  Recent Labs  Lab 12/01/23 1029 12/01/23 1031  WBC 4.4  --   NEUTROABS 2.9  --   HGB 14.6 15.3  HCT 44.2 45.0  MCV 84.4  --   PLT 122*  --    Basic Metabolic Panel:  Lab Results  Component Value Date   NA 140 12/01/2023   K 4.3 12/01/2023   CO2 25 02/03/2023   GLUCOSE 96 12/01/2023   BUN 15 12/01/2023   CREATININE 1.50 (H) 12/01/2023   CALCIUM  9.1 02/03/2023   GFRNONAA 53 (L) 08/23/2020   GFRAA 56 (L) 01/17/2020   Lipid Panel:  Lab Results  Component Value Date   LDLCALC 37 06/02/2021   HgbA1c:  Lab Results  Component Value Date   HGBA1C  04/02/2007    5.7 (NOTE)   The ADA recommends the following therapeutic goals for glycemic   control related to Hgb A1C measurement:   Goal of Therapy:   < 7.0% Hgb A1C   Action Suggested:  > 8.0% Hgb A1C   Ref:  Diabetes Care, 22, Suppl. 1, 1999   Urine Drug Screen:     Component Value Date/Time   LABOPIA NONE DETECTED 04/02/2007 1250   COCAINSCRNUR NONE DETECTED 04/02/2007 1250   LABBENZ NONE DETECTED 04/02/2007 1250   AMPHETMU NONE DETECTED 04/02/2007 1250   THCU NONE DETECTED 04/02/2007 1250   LABBARB  04/02/2007 1250    NONE DETECTED        DRUG SCREEN FOR MEDICAL PURPOSES ONLY.  IF CONFIRMATION IS NEEDED FOR ANY PURPOSE, NOTIFY LAB WITHIN 5 DAYS.    Alcohol Level     Component Value Date/Time   [facility]  04/02/2007 1320    <5        LOWEST DETECTABLE LIMIT FOR SERUM ALCOHOL IS 11 mg/dL FOR MEDICAL PURPOSES ONLY   INR  Lab Results  Component Value Date   INR 1.1 12/01/2023   APTT  Lab Results  Component Value Date   APTT 28 12/01/2023    CT Head without contrast(Personally reviewed): 12/01/23 1048 IMPRESSION: 1. Subtle hypoattenuation involving the right caudate and right lentiform nuclei which may reflect acute infarct. 2. No acute intracranial hemorrhage. 3. Remote infarct involving the right frontal operculum, insula, and superior right temporal lobe. 4. Asymmetric density of the  right M1 segment. Recommend correlation with CTA. 5. ASPECTS is 8  CT angio Head and Neck with contrast (Personally reviewed):  IMPRESSION: Occlusion of the right M1 segment as well as a proximal M2 branch of the right MCA with intraluminal thrombus noted. Reconstitution of multiple M2 branches of the right MCA. Several M2 inferior division branches demonstrate diminished intraluminal contrast.   Diffuse region of elevated T-max throughout the right MCA territory concerning for ischemic changes without evidence of core infarct.   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 Reconstitution of the supraclinoid left ICA as well as the left ACA and left MCA via the circle-of-Willis.   Small right pleural effusion.  Assessment   Richard [name] is a 68 y.o. 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 Imaging revealed right MCA M1 occlusion and as patient presented outside of the thrombolytic  therapy time window, TNK was not offered.  Discussed with patient and patient's wife risks, benefits, and alternative therapies to IR and they consented to IR thrombectomy.    Primary Diagnosis:  Cerebral infarction, unspecified.  Secondary Diagnosis: Essential (primary) hypertension  Plan  Acute Ischemic Stroke Cerebral infarction due to embolism of right middle cerebral artery  Acuity: Acute Current Suspected Etiology: Occlusion  Continue Evaluation:  -Admit to: ICU -Prophylaxis per IR  -Blood pressure control, goal per IR -MRI/ECHO/A1C/Lipid panel. -Hyperglycemia management per SSI to maintain glucose 140-180mg /dL. -PT/OT/ST therapies and  recommendations when able  CNS Acute ischemic stroke -Close neuro monitoring  Dysarthria Dysphagia following cerebral infarction  -NPO until cleared by speech/bedside swallow -ST -Advance diet as tolerated  Hemiplegia and hemiparesis following cerebral infarction affecting left non-dominant side  -PT/OT  RESP Intubated for IR procedure -vent management per ICU if remains intubated following IR -wean when able -PCCM consult as appropriate -Supplemental oxygen for SpO2 goal of >92%  CV Essential (primary) hypertension -Aggressive BP control, goal per IR -PRN Labetalol, cleviprex for blood pressure support  Chronic systolic  and diastolic (congestive) heart failure  -TTE -Continue BB -Cards Consult as needed  Hyperlipidemia, unspecified  - Statin for goal LDL < 70  HEME AM CBC -Monitor -Trend H&H -Transfuse for hgb < 7  HIV+ - Viral load undetectable February 2025 - Continue Dovato    ENDO A1c pending  -goal HgbA1c < 7%  GI/GU CKD Stage 3B -Gentle hydration -avoid nephrotoxic agents -renal consult as needed  Fluid/Electrolyte Disorders AM CMP -Replete as needed -Repeat labs -Trend  ID Trend fever and WBC curve  Prophylaxis DVT:  SCDs GI: PPI Bowel: Docusate / Senna  Diet: NPO until cleared by speech  Code Status: Full Code    THE FOLLOWING WERE PRESENT ON ADMISSION: CNS -  Acute Ischemic Stroke, Hemiplegia Cardiovascular - Chronic Systolic Diastolic CHF, ICD Renal -  CKD 3B Heme-  HIV+ ______________________________________________________________________   Sherrod Dolphin, AGACNP-BC [facility] Pager: [phone]   Attending Neurohospitalist Addendum Patient seen and examined with APP/Resident. Agree with the history and physical as documented above. Agree with the plan as documented, which I helped formulate. I have edited the note above to reflect my full findings and recommendations. I have independently reviewed  the chart, obtained history, review of systems and examined the patient.I have personally reviewed pertinent head/neck/spine imaging (CT/MRI). Please feel free to call with any questions.  This patient is critically ill and at significant risk of neurological worsening, death and care requires constant monitoring of vital signs, hemodynamics,respiratory and cardiac monitoring, neurological assessment, discussion with family, other specialists and medical decision making of high complexity. I spent 95 minutes of neurocritical care time  in the care of  this patient. This was time spent independent of any time provided by nurse practitioner or PA.  Greg Leaks, MD [facility] [phone]  If 7pm- 7am, please page neurology on call as listed in AMION.

## 2023-12-01 NOTE — ED Triage Notes (Signed)
 Pt BIBEMS from home after being found on the floor by his family after he called for help. Patient noticed to have slurred speech, left sided facial droop, left sided weakness, and decreased sensation. Patient is alert and oriented. Last known well 1600 11/30/2023  18 G RAC CBG 110

## 2023-12-01 NOTE — Progress Notes (Signed)
 Spoke with IR Dr. Laverta Potters regarding phenylephrine  order.  Orders was received and Dr. Laverta Potters requested BP parameters sys 130-180.

## 2023-12-01 NOTE — Sedation Documentation (Signed)
 Patient moved to table by staff , secured, hooked to monitors, and now under the care of anesthesia. Please see charting in vitals per CRNA.

## 2023-12-01 NOTE — Consult Note (Signed)
 [name], MRN:  [id number], DOB:  07-25-1956, LOS: 0 ADMISSION DATE:  12/01/2023, CONSULTATION DATE:  12/01/2023 REFERRING MD:  Doretta Gant  CHIEF COMPLAINT:  hypotension   History of Present Illness:  68 year old male with past medical history of CAD s/p CABG x3 2015, ischemic cardiomyopathy s/p ICD placement, hypertension, hyperlipidemia, HIV, GERD, stroke in 2008 without residual who presents to the ED with slurred speech, left-sided facial droop and left-sided weakness.  Last known well 11/30/2023 at 1800.  Wife was here at the hospital seeing another family member when she called home this morning and heard patient's slurred speech.  Initial CT head showed subtle hypoattenuation involving the right caudate and right lentiform nuclei, ASPECTS 8.  CT angiography showed occlusion of the right M1 segment, proximal M2 branch of the right MCA with intraluminal thrombus.  Went to IR and had 6 passes into the right M2 segment with modest improvement.  Posttreatment TICI score 2A.  Labs in ED with platelet 122, creatinine 1.33, ethanol negative.  Patient was admitted to ICU under stroke team.  Post NIR, patient required phenylephrine  to maintain BP parameter of SBP 130-180 per Dr. Laverta Potters.  Meds were changed to norepinephrine .  CCM was consulted in the setting of vasopressor use.  Pertinent  Medical History  CAD s/p CABG times 10/26/2013, ischemic cardiomyopathy s/p ICD placement, hypertension, hyperlipidemia, HIV, GERD, stroke in 2008 without residual  Significant Hospital Events: Including procedures, antibiotic start and stop dates in addition to other pertinent events   4/24: Right M1 stroke s/p NIR, now on Levophed   Interim History / Subjective:  In ICU s/p thrombectomy on levophed  for MAP goals. Speech slightly improving. CCM consult for vasopressor use.   Objective   Blood pressure (!) 142/92, pulse 67, temperature 97.7 F (36.5 C), temperature source Oral, resp. rate 14, height 5\' 10"   (1.778 m), weight 78.6 kg, SpO2 97%.        Intake/Output Summary (Last 24 hours) at 12/01/2023 1629 Last data filed at 12/01/2023 1519 Gross per 24 hour  Intake 700 ml  Output 1220 ml  Net -520 ml   Filed Weights   12/01/23 1000  Weight: 78.6 kg    Examination: General: middle aged male, sitting in bed, no acute distress  HENT: ncat, perrla, nasal cannula Lungs: 2LNC, resp even and unlabored, CTAB Cardiovascular: s1s2, rrr, PVCs intermittently  Abdomen: rounded, soft  Extremities: warm Neuro: alert, oriented, left droop, left sided weakness, slurred speech, perrla GU: defer   Resolved Hospital Problem list    Assessment & Plan:  Acute right M1 stroke s/p right MCA and M2 thrombectomy Last known well 11/30/2023 1800.  Presented with left facial droop and left weakness, slurred speech.  CT with right M1 and proximal M2 thrombus s/p NIR.  Had 6 passes into the right M2 segment with modest improvement.  Placed on phenylephrine  to maintain SBP goal. - Stroke primary, appreciate management - Goal SBP 130-180 - Titrate levo to SBP goal as above, avoid phenylephrine  in someone with severe cardiomyopathy - F/u MRI Brain - F/u Echo, lipid panel, A1c - Frequent neuro checks - Neuroprotective measures: HOB > 30 degrees, normoglycemia, normothermia, electrolytes WNL - PT/OT/SLP when able to participate in care   CAD s/p CABG x 3 2015 Ischemic cardiomyopathy s/p ICD placement Hypertension Hyperlipidemia Most recent echo 02/2023 with LVEF 30 to 35%, regional wall motion abnormalities, mildly dilated LV, G1 DD, RV mildly reduced, LA moderately dilated trivial AR and TR.  - f/u  lipid panel - repeat echo  - SBP goal 130-180 per Dr. Doretta Gant and Dr. Laverta Potters  - statin per neuro  - can restart BB once off vasopressor support   CKD IIIb Presenting sCr 1.33, stable.  - maintain renal perfusion  - renally dose meds, avoid nephrotoxic agents  - monitor I/O  HIV Viral load undetectable  09/2023 - can continue Dovato    GERD - continue home H2B  Best Practice (right click and "Reselect all SmartList Selections" daily)   Diet/type: clear liquids ADAT DVT prophylaxis: LMWH Pressure ulcer(s): na GI prophylaxis: H2B Lines: N/A Foley:  N/A Code Status:  full code Last date of multidisciplinary goals of care discussion [per primary]  Labs   CBC: Recent Labs  Lab 12/01/23 1029 12/01/23 1031  WBC 4.4  --   NEUTROABS 2.9  --   HGB 14.6 15.3  HCT 44.2 45.0  MCV 84.4  --   PLT 122*  --     Basic Metabolic Panel: Recent Labs  Lab 12/01/23 1029 12/01/23 1031 12/01/23 1456  NA 138 140  --   K 4.3 4.3  --   CL 106 105  --   CO2 23  --   --   GLUCOSE 100* 96  --   BUN 13 15  --   CREATININE 1.33* 1.50* 1.34*  CALCIUM  9.2  --   --    GFR: Estimated Creatinine Clearance: 56 mL/min (A) (by C-G formula based on SCr of 1.34 mg/dL (H)). Recent Labs  Lab 12/01/23 1029  WBC 4.4    Liver Function Tests: Recent Labs  Lab 12/01/23 1029  AST 25  ALT 36  ALKPHOS 54  BILITOT 0.8  PROT 7.6  ALBUMIN 4.1   No results for input(s): "LIPASE", "AMYLASE" in the last 168 hours. No results for input(s): "AMMONIA" in the last 168 hours.  ABG    Component Value Date/Time   PHART 7.406 04/02/2014 0955   PCO2ART 32.4 (L) 04/02/2014 0955   PO2ART 79.0 (L) 04/02/2014 0955   HCO3 22.6 04/02/2014 0959   TCO2 26 12/01/2023 1031   ACIDBASEDEF 3.0 (H) 04/02/2014 0959   O2SAT 65.0 04/02/2014 0959     Coagulation Profile: Recent Labs  Lab 12/01/23 1029  INR 1.1    Cardiac Enzymes: No results for input(s): "CKTOTAL", "CKMB", "CKMBINDEX", "TROPONINI" in the last 168 hours.  HbA1C: Hgb A1c MFr Bld  Date/Time Value Ref Range Status  12/01/2023 02:56 PM 5.6 4.8 - 5.6 % Final    Comment:    (NOTE) Pre diabetes:          5.7%-6.4%  Diabetes:              >6.4%  Glycemic control for   <7.0% adults with diabetes   04/02/2007 01:20 PM   Final   5.7 (NOTE)    The ADA recommends the following therapeutic goals for glycemic   control related to Hgb A1C measurement:   Goal of Therapy:   < 7.0% Hgb A1C   Action Suggested:  > 8.0% Hgb A1C   Ref:  Diabetes Care, 22, Suppl. 1, 1999    CBG: Recent Labs  Lab 12/01/23 1027  GLUCAP 99    Review of Systems:   As above  Past Medical History:  He,  has a past medical history of Coronary artery disease, GERD (gastroesophageal reflux disease), HIV (human immunodeficiency virus infection) (HCC), Hypertension, Ischemic cardiomyopathy (08/09/2013), and Stroke (HCC).   Surgical History:   Past Surgical History:  Procedure Laterality Date   CARDIAC CATHETERIZATION  04/02/2014   DR Loetta Ringer   ESOPHAGOGASTRODUODENOSCOPY (EGD) WITH PROPOFOL  N/A 09/08/2018   Procedure: ESOPHAGOGASTRODUODENOSCOPY (EGD) WITH PROPOFOL ;  Surgeon: Alvis Jourdain, MD;  Location: [facility] ENDOSCOPY;  Service: Endoscopy;  Laterality: N/A;   IMPLANTABLE CARDIOVERTER DEFIBRILLATOR IMPLANT N/A 09/10/2014   Procedure: IMPLANTABLE CARDIOVERTER DEFIBRILLATOR IMPLANT;  Surgeon: Luana Rumple, MD;  [facility] [employer] model [id number] serial number [id number]   IR CT HEAD LTD  12/01/2023   IR PERCUTANEOUS ART THROMBECTOMY/INFUSION INTRACRANIAL INC DIAG ANGIO  12/01/2023   IR US  GUIDE VASC ACCESS RIGHT  12/01/2023   LEFT AND RIGHT HEART CATHETERIZATION WITH CORONARY ANGIOGRAM N/A 04/02/2014   Procedure: LEFT AND RIGHT HEART CATHETERIZATION WITH CORONARY ANGIOGRAM;  Surgeon: Millicent Ally, MD;  Location: [facility] CATH LAB;  Service: Cardiovascular;  Laterality: N/A;   MEDIAL COLLATERAL LIGAMENT REPAIR, KNEE Right 08/25/2015   Procedure: RIGHT THUMB RADICAL COLLATERAL LIGAMENT REPAIR;  Surgeon: Florida Hurter, MD;  Location: [facility] OR;  Service: Orthopedics;  Laterality: Right;   PERCUTANEOUS CORONARY STENT INTERVENTION (PCI-S) N/A 04/03/2014   Procedure: PERCUTANEOUS CORONARY STENT INTERVENTION (PCI-S);  Surgeon: Peter M Swaziland, MD;  Location: [facility] CATH LAB;  Service:  Cardiovascular;  Laterality: N/A;   SAVORY DILATION N/A 09/08/2018   Procedure: SAVORY DILATION;  Surgeon: Alvis Jourdain, MD;  Location: [facility] ENDOSCOPY;  Service: Endoscopy;  Laterality: N/A;     Social History:   reports that he has never smoked. He has never used smokeless tobacco. He reports that he does not drink alcohol and does not use drugs.   Family History:  His family history includes Heart disease in his mother. There is no history of Esophageal cancer or Colon cancer.   Allergies Allergies  Allergen Reactions   Iodine    Lisinopril  Cough   Ioxaglate Hives   Ivp Dye [Iodinated Contrast Media] Hives     Home Medications  Prior to Admission medications   Medication Sig Start Date End Date Taking? Authorizing Provider  aspirin  81 MG chewable tablet Chew 81 mg by mouth daily.    [provider]  atorvastatin  (LIPITOR) 40 MG tablet Take 1 tablet by mouth once daily 07/15/22   Croitoru, Mihai, MD  carboxymethylcellulose (REFRESH PLUS) 0.5 % SOLN INSTILL 1 DROP IN BOTH EYES FOUR TIMES A DAY 07/09/20   [provider]  cetirizine (ZYRTEC) 10 MG tablet Take 10 mg by mouth daily.    [provider]  dolutegravir -lamiVUDine  (DOVATO ) 50-300 MG tablet Take 1 tablet by mouth daily. 09/15/20   [provider]  DULoxetine (CYMBALTA) 30 MG capsule Take 30 mg by mouth 2 (two) times daily.    [provider]  empagliflozin  (JARDIANCE ) 10 MG TABS tablet Take 1 tablet (10 mg total) by mouth daily. Patient not taking: Reported on 03/08/2023 03/07/23   Croitoru, Karyl Paget, MD  ezetimibe  (ZETIA ) 10 MG tablet Take 1 tablet (10 mg total) by mouth daily. 10/30/21   Millicent Ally, MD  famotidine  (PEPCID ) 20 MG tablet Take 20 mg by mouth 2 (two) times daily. 12/28/21   [provider]  fluticasone (FLONASE) 50 MCG/ACT nasal spray Place 2 sprays into both nostrils daily as needed for rhinitis.    [provider]  furosemide  (LASIX ) 20 MG tablet Take 20  mg by mouth daily. Pt says PCP told him to stop med til after this office visit 06/25/14   [provider]  loratadine  (CLARITIN ) 10 MG tablet Take 10 mg by mouth daily. 08/12/15  [provider]  metoprolol  succinate (TOPROL -XL) 25 MG 24 hr tablet Take 0.5 tablets (12.5 mg total) by mouth daily. 03/07/23   Croitoru, Mihai, MD  Multiple Vitamins-Minerals (CENTRUM SILVER PO) Take 2 tablets by mouth daily. Patient not taking: Reported on 03/08/2023    [provider]  oxybutynin (DITROPAN) 5 MG tablet Take 5 mg by mouth in the morning and at bedtime. 08/19/21   [provider]  pantoprazole  (PROTONIX ) 40 MG tablet Take 1 tablet (40 mg total) by mouth daily as needed. 04/04/14   Meng, Hao, PA  risperiDONE  (RISPERDAL ) 1 MG tablet Take 1.5 mg by mouth at bedtime.    [provider]  tacrolimus (PROTOPIC) 0.1 % ointment Apply topically 2 (two) times daily. Patient not taking: Reported on 03/08/2023 10/22/22   [provider]  Tretinoin, Facial Wrinkles, 0.05 % CREA Apply topically. 10/22/22   [provider]     Critical care time: 72   Arlys Lamer [facility] 12/01/23 4:37 PM  Please see [email or web address] for pager details.  From 7A-7P if no response, please call [phone] After hours, please call ELink [phone]

## 2023-12-01 NOTE — Procedures (Addendum)
 Attending addendum:  After review of case with Dr. Doretta Gant, we have elected to proceed with permissive BP parameters to maintain SBP <180.  NIR Procedure Note  Preop Dx: Acute ischemic stroke Post Dx: Acute on chronic occlusive disease, right M1 and M2, with stroke  Procedure: Right MCA and M2 thombectomy (6 passes) Operator: Reagan Camera MD  EBL: 500 Complications: No immediate  Findings: Some improvement in right M1 disease.  Disal M1 / Proximal M2 occlusion, possibly chronic, with robust collaterals  TICI Score: 2a  Length of Bedrest: 2 hours from NIR standpoint BP goal next 24 hours: SBP 120-160  Sheath Closure: Right femoral angioseal Disposition: PACU  Please call with questions, concerns, or change in patient condition  Reagan Camera MD

## 2023-12-01 NOTE — Sedation Documentation (Signed)
 Patient transported to recovery area via bed with CRNA.

## 2023-12-01 NOTE — Code Documentation (Signed)
 Stroke Response Nurse Documentation Code Documentation  Richard [name] is a 68 y.o. male arriving to [facility]  via [facility] on 12/01/2023 with past medical hx of HIV, CVA, CHF, HTN, Schizophrenia, CKD, cardiomyoptahy, . On aspirin  81 mg daily. Code stroke was activated by EMS.   Patient from home where he was LKW at 1800 4/23/205 and now complaining of left sided weakness and slurred speech. Per EMS, patient was last seen normal at 1800 4/23. Woke up this morning at 0900 and spoke with sister on the phone when she noticed he was slurred. When she got to him he was on the floor with slurred speech and left sided weakness.  Stroke team at the bedside on patient arrival. Labs drawn and patient cleared for CT by EDP. Patient to CT with team. NIHSS 11, see documentation for details and code stroke times. Patient with left facial droop, left arm weakness, left leg weakness, left decreased sensation, dysarthria , and Sensory  neglect on exam. The following imaging was completed:  CT Head, CTA, and CTP. Patient is not a candidate for IV Thrombolytic due to outside of window per MD. Patient is a candidate for IR due to LVO noted on imaging per MD.   Care Plan: patient taken to IR.   Bedside handoff with IR RN Katie.    Selestino Dakin  Stroke Response RN

## 2023-12-01 NOTE — Transfer of Care (Signed)
 Immediate Anesthesia Transfer of Care Note  Patient: Richard [name]  Procedure(s) Performed: RADIOLOGY WITH ANESTHESIA  Patient Location: PACU  Anesthesia Type:General  Level of Consciousness: drowsy and patient cooperative  Airway & Oxygen Therapy: Patient Spontanous Breathing and Patient connected to face mask oxygen  Post-op Assessment: Report given to RN and Post -op Vital signs reviewed and stable  Post vital signs: Reviewed and stable  Last Vitals:  Vitals Value Taken Time  BP 124/86 12/01/23 1315  Temp    Pulse 80 12/01/23 1315  Resp 14 12/01/23 1318  SpO2 100 % 12/01/23 1315  Vitals shown include unfiled device data.  Last Pain:  Vitals:   12/01/23 1023  PainSc: 8          Complications: No notable events documented.

## 2023-12-01 NOTE — Anesthesia Preprocedure Evaluation (Addendum)
 Anesthesia Evaluation  Patient identified by MRN, date of birth, ID band Patient confused    Reviewed: Allergy & Precautions, NPO status , Patient's Chart, lab work & pertinent test results, reviewed documented beta blocker date and time , Unable to perform ROS - Chart review onlyPreop documentation limited or incomplete due to emergent nature of procedure.  Airway Mallampati: II  TM Distance: >3 FB Neck ROM: Full    Dental  (+) Dental Advisory Given, Upper Dentures   Pulmonary neg pulmonary ROS   Pulmonary exam normal breath sounds clear to auscultation       Cardiovascular hypertension, Pt. on home beta blockers + CAD, + Cardiac Stents, + Peripheral Vascular Disease and +CHF  Normal cardiovascular exam+ dysrhythmias Ventricular Tachycardia (-) pacemaker+ Cardiac Defibrillator  Rhythm:Regular Rate:Normal     Neuro/Psych  PSYCHIATRIC DISORDERS Anxiety Depression  Schizophrenia  CVA    GI/Hepatic Neg liver ROS,GERD  Medicated,,  Endo/Other  diabetes, Type 2, Oral Hypoglycemic Agents    Renal/GU negative Renal ROS     Musculoskeletal negative musculoskeletal ROS (+)    Abdominal   Peds  Hematology  (+) HIV  Anesthesia Other Findings Day of surgery medications reviewed with the patient.  Reproductive/Obstetrics                             Anesthesia Physical Anesthesia Plan  ASA: 4 and emergent  Anesthesia Plan: General   Post-op Pain Management:    Induction: Intravenous  PONV Risk Score and Plan: 2 and Treatment may vary due to age or medical condition  Airway Management Planned: Oral ETT  Additional Equipment:   Intra-op Plan:   Post-operative Plan: Possible Post-op intubation/ventilation  Informed Consent:      Only emergency history available and History available from chart only  Plan Discussed with: CRNA  Anesthesia Plan Comments: (Pre-op eval completed after  induction due to emergent nature of procedure. )       Anesthesia Quick Evaluation

## 2023-12-01 NOTE — Anesthesia Postprocedure Evaluation (Signed)
 Anesthesia Post Note  Patient: Richard [name]  Procedure(s) Performed: RADIOLOGY WITH ANESTHESIA     Patient location during evaluation: PACU Anesthesia Type: General Level of consciousness: awake and alert Pain management: pain level controlled Vital Signs Assessment: post-procedure vital signs reviewed and stable Respiratory status: spontaneous breathing, nonlabored ventilation, respiratory function stable and patient connected to nasal cannula oxygen Cardiovascular status: blood pressure returned to baseline and stable Postop Assessment: no apparent nausea or vomiting Anesthetic complications: no  No notable events documented.  Last Vitals:  Vitals:   12/01/23 1428 12/01/23 1433  BP: 113/83 131/72  Pulse:  69  Resp:  16  Temp:    SpO2:  100%    Last Pain:  Vitals:   12/01/23 1315  PainSc: 0-No pain                 Rosalita Combe

## 2023-12-01 NOTE — Sedation Documentation (Signed)
 Bedside report given to RN. Femoral site assessed - Level 0, no hematoma, dressing is clean, dry, and intact. Pulses also assessed bilaterally.

## 2023-12-01 NOTE — Progress Notes (Signed)
 Voiced concerns to [facility] Dr. Laverta Potters that patient was on a high dose of neo for peripheral IV. Orders were given for 500cc bolus NS.  Will administer.

## 2023-12-01 NOTE — ED Provider Notes (Signed)
 [place] EMERGENCY DEPARTMENT AT [facility] Provider Note   CSN: [id number] Arrival date & time: 12/01/23  1025     History {Add pertinent medical, surgical, social history, OB history to HPI:1} Chief complaint: Stroke  Richard [name] is a 68 y.o. male.  HPI   Patient has a history of prior strokes, HIV, hypertension, cardiomyopathy, CHF, schizophrenia, chronic kidney disease who presents ED for acute stroke.  Home Medications Prior to Admission medications   Medication Sig Start Date End Date Taking? Authorizing Provider  aspirin  81 MG chewable tablet Chew 81 mg by mouth daily.    [provider]  atorvastatin  (LIPITOR) 40 MG tablet Take 1 tablet by mouth once daily 07/15/22   Croitoru, Mihai, MD  carboxymethylcellulose (REFRESH PLUS) 0.5 % SOLN INSTILL 1 DROP IN BOTH EYES FOUR TIMES A DAY 07/09/20   [provider]  cetirizine (ZYRTEC) 10 MG tablet Take 10 mg by mouth daily.    [provider]  dolutegravir -lamiVUDine  (DOVATO ) 50-300 MG tablet Take 1 tablet by mouth daily. 09/15/20   [provider]  DULoxetine (CYMBALTA) 30 MG capsule Take 30 mg by mouth 2 (two) times daily.    [provider]  empagliflozin  (JARDIANCE ) 10 MG TABS tablet Take 1 tablet (10 mg total) by mouth daily. Patient not taking: Reported on 03/08/2023 03/07/23   Croitoru, Mihai, MD  ezetimibe  (ZETIA ) 10 MG tablet Take 1 tablet (10 mg total) by mouth daily. 10/30/21   Millicent Ally, MD  famotidine  (PEPCID ) 20 MG tablet Take 20 mg by mouth 2 (two) times daily. 12/28/21   [provider]  fluticasone (FLONASE) 50 MCG/ACT nasal spray Place 2 sprays into both nostrils daily as needed for rhinitis.    [provider]  furosemide  (LASIX ) 20 MG tablet Take 20 mg by mouth daily. Pt says PCP told him to stop med til after this office visit 06/25/14   [provider]  loratadine  (CLARITIN ) 10 MG tablet Take 10 mg by mouth daily. 08/12/15    [provider]  metoprolol  succinate (TOPROL -XL) 25 MG 24 hr tablet Take 0.5 tablets (12.5 mg total) by mouth daily. 03/07/23   Croitoru, Mihai, MD  Multiple Vitamins-Minerals (CENTRUM SILVER PO) Take 2 tablets by mouth daily. Patient not taking: Reported on 03/08/2023    [provider]  oxybutynin (DITROPAN) 5 MG tablet Take 5 mg by mouth in the morning and at bedtime. 08/19/21   [provider]  pantoprazole  (PROTONIX ) 40 MG tablet Take 1 tablet (40 mg total) by mouth daily as needed. 04/04/14   Meng, Hao, PA  risperiDONE  (RISPERDAL ) 1 MG tablet Take 1.5 mg by mouth at bedtime.    [provider]  tacrolimus (PROTOPIC) 0.1 % ointment Apply topically 2 (two) times daily. Patient not taking: Reported on 03/08/2023 10/22/22   [provider]  Tretinoin, Facial Wrinkles, 0.05 % CREA Apply topically. 10/22/22   [provider]      Allergies    Iodine, Lisinopril , Ioxaglate, and Ivp dye [iodinated contrast media]    Review of Systems   Review of Systems  Physical Exam Updated Vital Signs Ht 1.778 m (5\' 10" )   Wt 78.6 kg   BMI 24.86 kg/m  Physical Exam Vitals and nursing note reviewed.  Constitutional:      Appearance: He is well-developed. He is not diaphoretic.  HENT:     Head: Normocephalic and atraumatic.     Right Ear: External ear normal.  Left Ear: External ear normal.  Eyes:     General: No scleral icterus.       Right eye: No discharge.        Left eye: No discharge.     Conjunctiva/sclera: Conjunctivae normal.  Neck:     Trachea: No tracheal deviation.  Cardiovascular:     Rate and Rhythm: Normal rate.  Pulmonary:     Effort: Pulmonary effort is normal. No respiratory distress.     Breath sounds: No stridor.  Abdominal:     General: There is no distension.  Musculoskeletal:        General: No swelling or deformity.     Cervical back: Neck supple.  Skin:    General: Skin is warm and dry.     Findings: No rash.   Neurological:     Mental Status: He is alert.     Cranial Nerves: No dysarthria or facial asymmetry.     Motor: No seizure activity.     Comments: Exam per neurology team, patient answering questions     ED Results / Procedures / Treatments   Labs (all labs ordered are listed, but only abnormal results are displayed) Labs Reviewed  CBC - Abnormal; Notable for the following components:      Result Value   Platelets 122 (*)    All other components within normal limits  I-STAT CHEM 8, ED - Abnormal; Notable for the following components:   Creatinine, Ser 1.50 (*)    Calcium , Ion 1.07 (*)    All other components within normal limits  PROTIME-INR  APTT  DIFFERENTIAL  COMPREHENSIVE METABOLIC PANEL WITH GFR  ETHANOL  CBG MONITORING, ED    EKG None  Radiology CT HEAD CODE STROKE WO CONTRAST Result Date: 12/01/2023 CLINICAL DATA:  Code stroke.  Neuro deficit, concern for stroke. EXAM: CT HEAD WITHOUT CONTRAST TECHNIQUE: Contiguous axial images were obtained from the base of the skull through the vertex without intravenous contrast. RADIATION DOSE REDUCTION: This exam was performed according to the departmental dose-optimization program which includes automated exposure control, adjustment of the mA and/or kV according to patient size and/or use of iterative reconstruction technique. COMPARISON:  MRI head 04/02/2007. FINDINGS: Brain: No acute intracranial hemorrhage. Encephalomalacia involving the right frontal operculum and superior right temporal lobe as well as the right insula compatible with prior infarct. There is subtle hypoattenuation in the right caudate nucleus and right lentiform nuclei which may reflect acute infarct. No significant edema or midline shift. The basilar cisterns are patent. The posterior fossa is unremarkable. Ventricles: The ventricles are normal. Vascular: Asymmetric dense appearance of the right M1 segment and possibly involving an M2 branch of the right MCA.  Atherosclerosis of the carotid siphons. Skull: No acute or aggressive finding. Orbits: Orbits are symmetric. Sinuses: Mild mucosal thickening in the ethmoid sinuses, sphenoid sinuses, and partially visualized right maxillary sinus. Mucosal thickening with near complete opacification of the left frontal sinus. Other: Mastoid air cells are clear. ASPECTS [facility] Stroke Program Early CT Score) - Ganglionic level infarction (caudate, lentiform nuclei, internal capsule, insula, M1-M3 cortex): 5 - Supraganglionic infarction (M4-M6 cortex): 3 Total score (0-10 with 10 being normal): 8 IMPRESSION: 1. Subtle hypoattenuation involving the right caudate and right lentiform nuclei which may reflect acute infarct. 2. No acute intracranial hemorrhage. 3. Remote infarct involving the right frontal operculum, insula, and superior right temporal lobe. 4. Asymmetric density of the right M1 segment. Recommend correlation with CTA. 5. ASPECTS is 8 These results were  communicated to Dr. Doretta Gant At 10:50 am on 12/01/2023 by text page via the [facility] messaging system. Electronically Signed   By: Denny Flack M.D.   On: 12/01/2023 10:50    Procedures Procedures  {Document cardiac monitor, telemetry assessment procedure when appropriate:1}  Medications Ordered in ED Medications  sodium chloride  flush (NS) 0.9 % injection 3 mL (has no administration in time range)  diphenhydrAMINE  (BENADRYL ) 50 MG/ML injection (has no administration in time range)  methylPREDNISolone  sodium succinate (SOLU-MEDROL ) 125 mg/2 mL injection 125 mg (has no administration in time range)  famotidine  (PEPCID ) IVPB 20 mg premix (has no administration in time range)  methylPREDNISolone  sodium succinate (SOLU-MEDROL ) 125 mg/2 mL injection (has no administration in time range)  famotidine  (PEPCID ) 20-0.9 MG/50ML-% IVPB (has no administration in time range)  iohexol  (OMNIPAQUE ) 350 MG/ML injection 100 mL (100 mLs Intravenous Contrast Given 12/01/23 1049)     ED Course/ Medical Decision Making/ A&P Clinical Course as of 12/01/23 1058  Thu Dec 01, 2023  1057 Patient has a contrast allergy.  He was given IV dye for his LVO study.  Patient is not having any symptoms at this time.  He has been given Benadryl  will also add on Solu-Medrol  and Pepcid  as a precaution [JK]    Clinical Course User Index [JK] Trish Furl, MD   {   Click here for ABCD2, HEART and other calculatorsREFRESH Note before signing :1}                              Medical Decision Making Amount and/or Complexity of Data Reviewed Labs: ordered. Radiology: ordered.  Risk Prescription drug management. Decision regarding hospitalization.  Patient presented with concerns of an acute stroke.  The patient's findings are concerning for possible LVO .  Patient is outside tPA window ***  {Document critical care time when appropriate:1} {Document review of labs and clinical decision tools ie heart score, Chads2Vasc2 etc:1}  {Document your independent review of radiology images, and any outside records:1} {Document your discussion with family members, caretakers, and with consultants:1} {Document social determinants of health affecting pt's care:1} {Document your decision making why or why not admission, treatments were needed:1} Final Clinical Impression(s) / ED Diagnoses Final diagnoses:  None    Rx / DC Orders ED Discharge Orders     None

## 2023-12-01 NOTE — Anesthesia Procedure Notes (Signed)
 Procedure Name: Intubation Date/Time: 12/01/2023 11:22 AM  Performed by: Conn Trombetta A, CRNAPre-anesthesia Checklist: Patient identified, Emergency Drugs available, Suction available and Patient being monitored Patient Re-evaluated:Patient Re-evaluated prior to induction Oxygen Delivery Method: Circle System Utilized Preoxygenation: Pre-oxygenation with 100% oxygen Induction Type: IV induction Ventilation: Mask ventilation without difficulty Laryngoscope Size: Glidescope and 4 Grade View: Grade I Tube type: Oral Tube size: 7.5 mm Number of attempts: 1 Airway Equipment and Method: Stylet and Oral airway Placement Confirmation: ETT inserted through vocal cords under direct vision, positive ETCO2 and breath sounds checked- equal and bilateral Secured at: 23 cm Tube secured with: Tape Dental Injury: Teeth and Oropharynx as per pre-operative assessment

## 2023-12-02 ENCOUNTER — Encounter (HOSPITAL_COMMUNITY): Payer: Self-pay | Admitting: Radiology

## 2023-12-02 ENCOUNTER — Inpatient Hospital Stay (HOSPITAL_COMMUNITY): Payer: Medicare (Managed Care)

## 2023-12-02 DIAGNOSIS — K219 Gastro-esophageal reflux disease without esophagitis: Secondary | ICD-10-CM | POA: Diagnosis not present

## 2023-12-02 DIAGNOSIS — I639 Cerebral infarction, unspecified: Secondary | ICD-10-CM

## 2023-12-02 DIAGNOSIS — R29711 NIHSS score 11: Secondary | ICD-10-CM

## 2023-12-02 DIAGNOSIS — I5023 Acute on chronic systolic (congestive) heart failure: Secondary | ICD-10-CM | POA: Diagnosis not present

## 2023-12-02 DIAGNOSIS — I63511 Cerebral infarction due to unspecified occlusion or stenosis of right middle cerebral artery: Secondary | ICD-10-CM

## 2023-12-02 DIAGNOSIS — I2510 Atherosclerotic heart disease of native coronary artery without angina pectoris: Secondary | ICD-10-CM | POA: Diagnosis not present

## 2023-12-02 DIAGNOSIS — I6389 Other cerebral infarction: Secondary | ICD-10-CM | POA: Diagnosis not present

## 2023-12-02 DIAGNOSIS — I63311 Cerebral infarction due to thrombosis of right middle cerebral artery: Secondary | ICD-10-CM | POA: Diagnosis not present

## 2023-12-02 DIAGNOSIS — I5082 Biventricular heart failure: Secondary | ICD-10-CM

## 2023-12-02 DIAGNOSIS — B20 Human immunodeficiency virus [HIV] disease: Secondary | ICD-10-CM

## 2023-12-02 DIAGNOSIS — I472 Ventricular tachycardia, unspecified: Secondary | ICD-10-CM

## 2023-12-02 DIAGNOSIS — I110 Hypertensive heart disease with heart failure: Secondary | ICD-10-CM

## 2023-12-02 DIAGNOSIS — N1832 Chronic kidney disease, stage 3b: Secondary | ICD-10-CM | POA: Diagnosis not present

## 2023-12-02 DIAGNOSIS — I69391 Dysphagia following cerebral infarction: Secondary | ICD-10-CM | POA: Diagnosis not present

## 2023-12-02 DIAGNOSIS — I509 Heart failure, unspecified: Secondary | ICD-10-CM

## 2023-12-02 DIAGNOSIS — I959 Hypotension, unspecified: Secondary | ICD-10-CM

## 2023-12-02 DIAGNOSIS — I255 Ischemic cardiomyopathy: Secondary | ICD-10-CM | POA: Diagnosis not present

## 2023-12-02 LAB — LIPID PANEL
Cholesterol: 82 mg/dL (ref 0–200)
HDL: 28 mg/dL — ABNORMAL LOW (ref >40)
LDL Cholesterol: 43 mg/dL (ref 0–99)
Total CHOL/HDL Ratio: 2.9 ratio
Triglycerides: 53 mg/dL (ref <150)
VLDL: 11 mg/dL (ref 0–40)

## 2023-12-02 LAB — ECHOCARDIOGRAM COMPLETE BUBBLE STUDY
Area-P 1/2: 3.99 cm2
Calc EF: 30.3 %
Height: 70 in
S' Lateral: 5.7 cm
Single Plane A2C EF: 38.5 %
Single Plane A4C EF: 26 %
Weight: 2772.5 [oz_av]

## 2023-12-02 LAB — CBC
HCT: 38.1 % — ABNORMAL LOW (ref 39.0–52.0)
Hemoglobin: 13.2 g/dL (ref 13.0–17.0)
MCH: 28.2 pg (ref 26.0–34.0)
MCHC: 34.6 g/dL (ref 30.0–36.0)
MCV: 81.4 fL (ref 80.0–100.0)
Platelets: 129 10*3/uL — ABNORMAL LOW (ref 150–400)
RBC: 4.68 MIL/uL (ref 4.22–5.81)
RDW: 15.2 % (ref 11.5–15.5)
WBC: 7.4 10*3/uL (ref 4.0–10.5)
nRBC: 0 % (ref 0.0–0.2)

## 2023-12-02 LAB — COMPREHENSIVE METABOLIC PANEL WITH GFR
ALT: 26 U/L (ref 0–44)
AST: 23 U/L (ref 15–41)
Albumin: 3.6 g/dL (ref 3.5–5.0)
Alkaline Phosphatase: 49 U/L (ref 38–126)
Anion gap: 9 (ref 5–15)
BUN: 17 mg/dL (ref 8–23)
CO2: 22 mmol/L (ref 22–32)
Calcium: 8.7 mg/dL — ABNORMAL LOW (ref 8.9–10.3)
Chloride: 106 mmol/L (ref 98–111)
Creatinine, Ser: 1.4 mg/dL — ABNORMAL HIGH (ref 0.61–1.24)
GFR, Estimated: 55 mL/min — ABNORMAL LOW (ref >60)
Glucose, Bld: 143 mg/dL — ABNORMAL HIGH (ref 70–99)
Potassium: 4 mmol/L (ref 3.5–5.1)
Sodium: 137 mmol/L (ref 135–145)
Total Bilirubin: 1 mg/dL (ref 0.0–1.2)
Total Protein: 7 g/dL (ref 6.5–8.1)

## 2023-12-02 MED ORDER — ATORVASTATIN CALCIUM 40 MG PO TABS
40.0000 mg | ORAL_TABLET | Freq: Every day | ORAL | Status: DC
  Administered 2023-12-03 – 2023-12-06 (×4): 40 mg via ORAL
  Filled 2023-12-02 (×4): qty 1

## 2023-12-02 MED ORDER — SODIUM CHLORIDE 0.9 % IV BOLUS: 250.0000 mL | INTRAVENOUS | Status: AC | PRN

## 2023-12-02 MED ORDER — CLOPIDOGREL BISULFATE 75 MG PO TABS
75.0000 mg | ORAL_TABLET | Freq: Every day | ORAL | Status: DC
  Administered 2023-12-02 – 2023-12-06 (×5): 75 mg via ORAL
  Filled 2023-12-02 (×5): qty 1

## 2023-12-02 MED ORDER — ASPIRIN 81 MG PO CHEW
81.0000 mg | CHEWABLE_TABLET | Freq: Every day | ORAL | Status: DC
  Administered 2023-12-02 – 2023-12-04 (×3): 81 mg via ORAL
  Filled 2023-12-02 (×3): qty 1

## 2023-12-02 MED ORDER — EZETIMIBE 10 MG PO TABS
10.0000 mg | ORAL_TABLET | Freq: Every day | ORAL | Status: DC
  Administered 2023-12-03 – 2023-12-06 (×4): 10 mg via ORAL
  Filled 2023-12-02 (×4): qty 1

## 2023-12-02 MED ORDER — SODIUM CHLORIDE 0.9 % IV SOLN: INTRAVENOUS | Status: DC

## 2023-12-02 NOTE — Evaluation (Signed)
 Clinical/Bedside Swallow Evaluation Patient Details  Name: Richard [name] MRN: [id number] Date of Birth: 10-24-1955  Today's Date: 12/02/2023 Time: SLP Start Time (ACUTE ONLY): 0759 SLP Stop Time (ACUTE ONLY): 0830 SLP Time Calculation (min) (ACUTE ONLY): 31 min  Past Medical History:  Past Medical History:  Diagnosis Date   Coronary artery disease    a. cath 04/02/2014 3v dx 80-90% in D1, 80-90% mid LAD, 95% apical LAD, 95% distal inferior LV branch of LCx, total occlusion of prox RCA  b). Cath 04/03/2014 DES to mid LAD, DES x3 to prox D1, DAPT indefinitely. LCx lesion untreated   GERD (gastroesophageal reflux disease)    HIV (human immunodeficiency virus infection) (HCC)    Hypertension    Ischemic cardiomyopathy 08/09/2013   EF 20-25 percent by echo in 04/2014, s/p [employer] model [id number] serial number [id number]   Stroke [facility])    Past Surgical History:  Past Surgical History:  Procedure Laterality Date   CARDIAC CATHETERIZATION  04/02/2014   DR Loetta Ringer   ESOPHAGOGASTRODUODENOSCOPY (EGD) WITH PROPOFOL  N/A 09/08/2018   Procedure: ESOPHAGOGASTRODUODENOSCOPY (EGD) WITH PROPOFOL ;  Surgeon: Alvis Jourdain, MD;  Location: [facility] ENDOSCOPY;  Service: Endoscopy;  Laterality: N/A;   IMPLANTABLE CARDIOVERTER DEFIBRILLATOR IMPLANT N/A 09/10/2014   Procedure: IMPLANTABLE CARDIOVERTER DEFIBRILLATOR IMPLANT;  Surgeon: Luana Rumple, MD;  [facility] model [phone] serial number [id number]   IR CT HEAD LTD  12/01/2023   IR PERCUTANEOUS ART THROMBECTOMY/INFUSION INTRACRANIAL INC DIAG ANGIO  12/01/2023   IR US  GUIDE VASC ACCESS RIGHT  12/01/2023   LEFT AND RIGHT HEART CATHETERIZATION WITH CORONARY ANGIOGRAM N/A 04/02/2014   Procedure: LEFT AND RIGHT HEART CATHETERIZATION WITH CORONARY ANGIOGRAM;  Surgeon: Millicent Ally, MD;  Location[facility] C[facility] LAB;  Service: Cardiovascular;  Laterality: N/A;   MEDIAL COLLATERAL LIGAMENT REPAIR, KNEE Right 08/25/2015   Procedure: RIGHT THUMB RADICAL  COLLATERAL LIGAMENT REPAIR;  Surgeon: Florida Hurter, MD;  Location: [facility] OR;  Service: Orthopedics;  Laterality: Right;   PERCUTANEOUS CORONARY STENT INTERVENTION (PCI-S) N/A 04/03/2014   Procedure: PERCUTANEOUS CORONARY STENT INTERVENTION (PCI-S);  Surgeon: Peter M Swaz[place]and, MD;  Location: [facility] CATH LAB;  Service: Cardiovascular;  Laterality: N/A;   RADIOLOGY WITH ANESTHESIA N/A 12/01/2023   Procedure: RADIOLOGY WITH ANESTHESIA;  Surgeon: Radiologist, Medication, MD;  Location: [facility] OR;  Service: Radiology;  Laterality: N/A;  Juana Nones DILATION N/A 09/08/2018   Procedure: SAVORY DILATION;  Surgeon: Alvis Jourdain, MD;  Location: [facility] ENDOSCOPY;  Service: Endoscopy;  Laterality: N/A;   HPI:  Richard [name] is a 68 y.o. 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  Patient was last seen in his usual state of health last night at 6 PM when his wife came to the hospital to see a family member.  This morning, she called her husband and he initially did not answer but when she called again, she noticed that his speech was slurred and he asked her to come home because something was not right.  When his wife got home, she found the patient on the floor with left-sided weakness, left-sided facial droop, and slurred speech with complaints of a severe headache prompting EMS activation.  On EMS arrival, patient's symptoms were persistent and a code stroke was activated.  Assessment / Plan / Recommendation  Clinical Impression   Pt presents with a mild-moderate oral dysphagia and concerns for a pharyngeal dysphagia per clinical swallow assessment completed today. Oral deficits characterized by reduced oral strength and coordination  resulting in min anterior loss of applesauce and cracker trial on L side. Pt did not appear sensate to residue on L side of lip. Pharyngeal swallow initiation appeared prompt with laryngeal elevation noted. Observed immediate coughing incident x1 with liquid wash that followed cracker trial, which is suspicious of airway violation of water and possible cracker residue.   Recommend instrumental swallow assessment to objectively assess swallow function and make safest diet recommendation given acute CVA with resultant oral deficits and dysarthria. FEES anticipated later today. Pt and wife in agreement. Initiated full liquids, thin liquids diet in the interim. Pt okay for whole meds in applesauce.   Plan: FEES later today.  SLP Visit Diagnosis: Dysphagia, unspecified (R13.10)    Aspiration Risk  Mild aspiration risk    Diet Recommendation Thin liquid (full liquids)    Medication Administration: Whole meds with puree Supervision: Staff to assist with self feeding Compensations: Slow rate;Small sips/bites Postural Changes: Seated upright at 90 degrees    Other  Recommendations Oral Care Recommendations: Oral care BID    Recommendations for follow up therapy are one component of a multi-disciplinary discharge planning process, led by the attending physician.  Recommendations may be updated based on patient status, additional functional criteria and insurance authorization.  Follow up Recommendations Acute inpatient rehab (3hours/day)      Assistance Recommended at Discharge  TBD  Functional Status Assessment Patient has had a recent decline in their functional status and demonstrates the ability to make significant improvements in function in a reasonable and predictable amount of time.  Frequency and Duration min 2x/week  4 weeks       Prognosis Prognosis for improved oropharyngeal function: Good      Swallow Study   General Date of Onset: 12/01/23 HPI: Richard [name] is a 68  y.o. 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  Patient was last seen in his usual state of health last night at 6 PM when his wife came to the hospital to see a family member.  This morning, she called her husband and he initially did not answer but when she called again, she noticed that his speech was slurred and he asked her to come home because something was not right.  When his wife got home, she found the patient on the floor with left-sided weakness, left-sided facial droop, and slurred speech with complaints of a severe headache prompting EMS activation.  On EMS arrival, patient's symptoms were persistent and a code stroke was activated. Type of Study: Bedside Swallow Evaluation Previous Swallow Assessment: none per chart Diet Prior to this Study: NPO Temperature Spikes Noted: No Respiratory Status: Nasal cannula History of Recent Intubation: No Behavior/Cognition: Cooperative;Alert;Pleasant mood Oral Cavity Assessment: Within Functional Limits Oral Cavity - Dentition: Edentulous;Dentures, top Vision: Functional for self-feeding Self-Feeding Abilities: Needs set up;Able to feed self Patient Positioning: Upright in bed Baseline Vocal Quality: Low vocal intensity Volitional Cough: Strong Volitional Swallow: Able to elicit    Oral/Motor/Sensory Function Overall Oral Motor/Sensory Function: Moderate impairment Facial ROM: Reduced  left;Suspected CN VII (facial) dysfunction Facial Symmetry: Abnormal symmetry left;Suspected CN VII (facial) dysfunction Facial Strength: Reduced left;Suspected CN VII (facial) dysfunction Facial Sensation: Reduced left;Suspected CN V (Trigeminal) dysfunction Lingual ROM: Within Functional  Limits Lingual Symmetry: Abnormal symmetry left;Suspected CN XII (hypoglossal) dysfunction Lingual Strength: Reduced;Suspected CN XII (hypoglossal) dysfunction Velum: Within Functional Limits Mandible: Within Functional Limits   Ice Chips Ice chips: Within functional limits   Thin Liquid Thin Liquid: Impaired Pharyngeal  Phase Impairments: Cough - Immediate Other Comments: immediate coughing observed with liquid wash post cracker trial. No coughing with thin liquids when presented in isolation or following applesauce    Nectar Thick Nectar Thick Liquid: Not tested   Honey Thick Honey Thick Liquid: Not tested   Puree Puree: Impaired Oral Phase Impairments: Reduced labial seal Oral Phase Functional Implications: Left anterior spillage   Solid     Solid: Impaired Oral Phase Impairments: Poor awareness of bolus Oral Phase Functional Implications: Left anterior spillage;Prolonged oral transit      Leverette Read 12/02/2023,10:59 AM

## 2023-12-02 NOTE — Progress Notes (Signed)
  Echocardiogram 2D Echocardiogram has been performed.  Royden Corin 12/02/2023, 1:44 PM
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
## 2023-12-02 NOTE — Progress Notes (Signed)
 PCCM progress note  Despite liberalization of blood pressure control from a neurological standpoint patient remained vasopressor dependent.  No clinical signs for underlying infection therefore additional medical workup initiated including complete echocardiogram.  Chart reviewed at this afternoon and echocardiogram reviewed and reveals an EF of 20 to 25% with severely decreased LV function and global hypokinesis with grade 1 diastolic dysfunction.  At this time we will continue vasopressor support and consult cardiology for further assistance in management.  Gerod Caligiuri D. Harris, NP-C [facility] Personal contact information can be found on Amion  If no contact or response made please call [phone] 12/02/2023, 3:34 PM

## 2023-12-02 NOTE — Progress Notes (Signed)
 NAMERITA PROM, MRN:  [id number], DOB:  [birth year], LOS: 1 ADMISSION DATE:  12/01/2023, CONSULTATION DATE:  12/01/2023 REFERRING MD:  Doretta Gant  CHIEF COMPLAINT:  hypotension   History of Present Illness:  68 year old male with past medical history of CAD s/p CABG x3 2015, ischemic cardiomyopathy s/p ICD placement, hypertension, hyperlipidemia, HIV, GERD, stroke in 2008 without residual who presents to the ED with slurred speech, left-sided facial droop and left-sided weakness.  Last known well 11/30/2023 at 1800.  Wife was here at the hospital seeing another family member when she called home this morning and heard patient's slurred speech.  Initial CT head showed subtle hypoattenuation involving the right caudate and right lentiform nuclei, ASPECTS 8.  CT angiography showed occlusion of the right M1 segment, proximal M2 branch of the right MCA with intraluminal thrombus.  Went to IR and had 6 passes into the right M2 segment with modest improvement.  Posttreatment TICI score 2A.  Labs in ED with platelet 122, creatinine 1.33, ethanol negative.  Patient was admitted to ICU under stroke team.  Post NIR, patient required phenylephrine  to maintain BP parameter of SBP 130-180 per Dr. Laverta Potters.  Meds were changed to norepinephrine .  CCM was consulted in the setting of vasopressor use.  Pertinent  Medical History  CAD s/p CABG times 10/26/2013, ischemic cardiomyopathy s/p ICD placement, hypertension, hyperlipidemia, HIV, GERD, stroke in 2008 without residual  Significant Hospital Events: Including procedures, antibiotic start and stop dates in addition to other pertinent events   4/24: Right M1 stroke s/p NIR, now on Levophed  4/25 mains on low-dose levo for SBP goal 130-180, consistent left-sided weakness with slight facial droop  Interim History / Subjective:  Lying in bed in no acute distress Spouse at bedside Remains on low-dose levo  Objective   Blood pressure (!) 145/92, pulse 78, temperature  99.3 F (37.4 C), temperature source Oral, resp. rate 17, height 5\' 10"  (1.778 m), weight 78.6 kg, SpO2 95%.        Intake/Output Summary (Last 24 hours) at 12/02/2023 0657 Last data filed at 12/02/2023 0600 Gross per 24 hour  Intake 2334.23 ml  Output 2270 ml  Net 64.23 ml   Filed Weights   12/01/23 1000  Weight: 78.6 kg    Examination: General: Acute on chronic ill-appearing deconditioned elderly male lying in bed on low-dose vasopressors in no acute distress HEENT: [place]/AT, MM pink/moist, PERRL,  Neuro: And oriented x 3, nonfocal CV: s1s2 regular rate and rhythm, no murmur, rubs, or gallops,  PULM: Auscultation bilaterally, no increased work of breathing, no added breath sounds GI: soft, bowel sounds active in all 4 quadrants, non-tender, non-distended Extremities: warm/dry, no edema  Skin: no rashes or lesions  Resolved Hospital Problem list    Assessment & Plan:  Acute right M1 stroke s/p right MCA and M2 thrombectomy -Presented with left facial droop and left weakness, slurred speech.  CT with right M1 and proximal M2 thrombus s/p NIR.  Had 6 passes into the right M2 segment with modest improvement. P: Management per neurology SBP goal 130-180 currently requiring peripheral levo wean as able Frequent neurochecks MRI brain pending this a.m. Neuroprotective measures Secondary stroke prevention PT/OT/SLP when able  CAD s/p CABG x 3 2015 Ischemic cardiomyopathy s/p ICD placement Hypertension Hyperlipidemia -Most recent echo 02/2023 with LVEF 30 to 35%, regional wall motion abnormalities, mildly dilated LV, G1 DD, RV mildly reduced, LA moderately dilated trivial AR and TR.  P: Continuous telemetry Post CVA repeat echo  pending Optimize electrolytes SBP goal as above Continue statin Resume home antihypertensives when able/safe Optimize electrolytes  CKD IIIb - Baseline creatinine appears to range between 1.3-1.5 GFR upper 40s to low 50s P: Follow renal function   Monitor urine output Trend Bmet Avoid nephrotoxins Ensure adequate renal perfusion   HIV -Viral load undetectable 09/2023 P: Continue home Dovato   GERD P: Resume home H2B when able to take oral medications   Best Practice (right click and "Reselect all SmartList Selections" daily)   Diet/type: clear liquids ADAT DVT prophylaxis: LMWH Pressure ulcer(s): na GI prophylaxis: H2B Lines: N/A Foley:  N/A Code Status:  full code Last date of multidisciplinary goals of care discussion [per primary]   Critical care time:   CRITICAL CARE Performed by: Dellis Voght D. Harris   Total critical care time: 38 minutes  Critical care time was exclusive of separately billable procedures and treating other patients.  Critical care was necessary to treat or prevent imminent or life-threatening deterioration.  Critical care was time spent personally by me on the following activities: development of treatment plan with patient and/or surrogate as well as nursing, discussions with consultants, evaluation of patient's response to treatment, examination of patient, obtaining history from patient or surrogate, ordering and performing treatments and interventions, ordering and review of laboratory studies, ordering and review of radiographic studies, pulse oximetry and re-evaluation of patient's condition.  Voula Waln D. Harris, NP-C [facility] Personal contact information can be found on Amion  If no contact or response made please call [phone] 12/02/2023, 7:03 AM

## 2023-12-02 NOTE — Plan of Care (Signed)
  Problem: SLP Dysphagia Goals Goal: Misc Dysphagia Goal Description: Pt will tolerate least restrictive diet with no overt or subtle s/s of aspiration or decline in pulmonary status.  Flowsheets (Taken 12/02/2023 1050) Misc Dysphagia Goal: Pt will tolerate the least restrictive diet with no overt or subtle s/s of aspiration or decline in pulmonary status   Problem: SLP Language Goals Goal: Patient will utilize speech intelligibility Description: Patient will utilize speech intelligibility strategies to  enhance communication with Flowsheets (Taken 12/02/2023 1050) Patient will utilize speech intelligibility strategies to enhance communication with ____: modified independence

## 2023-12-02 NOTE — Progress Notes (Signed)

## 2023-12-02 NOTE — Evaluation (Signed)
 Speech Language Pathology Evaluation Patient Details Name: Richard [name] MRN: [id number] DOB: 1956-07-02 Today's Date: 12/02/2023 Time: 6578-4696 SLP Time Calculation (min) (ACUTE ONLY): 33 min  Problem List:  Patient Active Problem List   Diagnosis Date Noted   Acute ischemic right MCA stroke (HCC) 12/01/2023   Schizophrenia, unspecified (HCC) 03/07/2023   Recurrent major depression (HCC) 03/07/2023   Pseudophakia of left eye 03/07/2023   Acid reflux 03/07/2023   Dyspepsia 03/07/2023   Anxiety state 03/07/2023   Cortical age-related cataract of right eye 03/07/2023   Psychotic disorder (HCC) 03/07/2023   CAD (coronary artery disease) 03/07/2023   Abdominal mass 07/12/2021   Peripheral vascular disease (HCC) 02/04/2021   Overweight 01/21/2020   Dysphagia 12/04/2018   Hiatal hernia 04/19/2018   Gastroesophageal reflux disease 04/03/2018   Ischemic congestive cardiomyopathy (HCC) 10/17/2017   Generalized anxiety disorder 10/17/2017   Prediabetes 10/16/2017   Acquired thrombocytopenia (HCC) 10/16/2017   Chronic kidney disease, stage 3 (HCC) 10/16/2017   Primary erectile dysfunction 10/13/2017   Chronic combined systolic and diastolic heart failure (HCC) 12/30/2015   Traumatic rupture of collateral ligament of other finger at metacarpophalangeal and interphalangeal joint, subsequent encounter 10/30/2015   Traumatic rupture of collateral ligament of other finger at metacarpophalangeal and interphalangeal joint, initial encounter 08/12/2015   VT (ventricular tachycardia) (HCC) 12/31/2014   ICD (implantable cardioverter-defibrillator) in place 09/10/2014   At risk for sudden cardiac death September 17, 2014   Systolic heart failure (HCC) 08/09/2014   Coronary artery disease involving native coronary artery of native heart without angina pectoris 04/04/2014   Unstable angina (HCC) 04/03/2014   Cardiomyopathy, ischemic 03/22/2014   History of CVA (cerebrovascular accident) 02/20/2014    Human immunodeficiency virus infection (HCC) 02/20/2014   Essential hypertension 02/20/2014   Hyperlipidemia LDL goal <70 02/20/2014   Depression 02/20/2014   Cerebral infarction (HCC) 08/09/2006   Past Medical History:  Past Medical History:  Diagnosis Date   Coronary artery disease    a. cath 04/02/2014 3v dx 80-90% in D1, 80-90% mid LAD, 95% apical LAD, 95% distal inferior LV branch of LCx, total occlusion of prox RCA  b). Cath 04/03/2014 DES to mid LAD, DES x3 to prox D1, DAPT indefinitely. LCx lesion untreated   GERD (gastroesophageal reflux disease)    HIV (human immunodeficiency virus infection) (HCC)    Hypertension    Ischemic cardiomyopathy 08/09/2013   EF 20-25 percent by echo in 04/2014, s/p [employer] model [id number] serial number [id number]   Stroke [facility])    Past Surgical History:  Past Surgical History:  Procedure Laterality Date   CARDIAC CATHETERIZATION  04/02/2014   DR Loetta Ringer   ESOPHAGOGASTRODUODENOSCOPY (EGD) WITH PROPOFOL  N/A 09/08/2018   Procedure: ESOPHAGOGASTRODUODENOSCOPY (EGD) WITH PROPOFOL ;  Surgeon: Alvis Jourdain, MD;  Location: [facility] ENDOSCOPY;  Service: Endoscopy;  Laterality: N/A;   IMPLANTABLE CARDIOVERTER DEFIBRILLATOR IMPLANT N/A 09/10/2014   Procedure: IMPLANTABLE CARDIOVERTER DEFIBRILLATOR IMPLANT;  Surgeon: Luana Rumple, MD;  [facility] [place] model [id number] serial number [phone]   IR CT HEAD LTD  12/01/2023   IR PERCUTANEOUS ART THROMBECTOMY/INFUSION INTRACRANIAL INC DIAG ANGIO  12/01/2023   IR US  GUIDE VASC ACCESS RIGHT  12/01/2023   LEFT AND RIGHT HEART CATHETERIZATION WITH CORONARY ANGIOGRAM N/A 04/02/2014   Procedure: LEFT AND RIGHT HEART CATHETERIZATION WITH CORONARY ANGIOGRAM;  Surgeon: Millicent Ally, MD;  Location: [facility] CATH LAB;  Service: Cardiovascular;  Laterality: N/A;   MEDIAL COLLATERAL LIGAMENT REPAIR, KNEE Right 08/25/2015   Procedure: RIGHT THUMB RADICAL COLLATERAL  LIGAMENT REPAIR;  Surgeon: Florida Hurter, MD;  Location: [facility]  OR;  Service: Orthopedics;  Laterality: Right;   PERCUTANEOUS CORONARY STENT INTERVENTION (PCI-S) N/A 04/03/2014   Procedure: PERCUTANEOUS CORONARY STENT INTERVENTION (PCI-S);  Surgeon: Peter M Swaziland, MD;  Location: [facility] CATH LAB;  Service: Cardiovascular;  Laterality: N/A;   RADIOLOGY WITH ANESTHESIA N/A 12/01/2023   Procedure: RADIOLOGY WITH ANESTHESIA;  Surgeon: Radiologist, Medication, MD;  Location: [facility] OR;  Service: Radiology;  Laterality: N/A;  Juana Nones DILATION N/A 09/08/2018   Procedure: SAVORY DILATION;  Surgeon: Alvis Jourdain, MD;  Location: [facility] ENDOSCOPY;  Service: Endoscopy;  Laterality: N/A;   HPI:  Richard [name] is a 68 y.o. 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  Patient was last seen in his usual state of health last night at 6 PM when his wife came to the hospital to see a family member.  This morning, she called her husband and he initially did not answer but when she called again, she noticed that his speech was slurred and he asked her to come home because something was not right.  When his wife got home, she found the patient on the floor with left-sided weakness, left-sided facial droop, and slurred speech with complaints of a severe headache prompting EMS activation.  On EMS arrival, patient's symptoms were persistent and a code stroke was activated.   Assessment / Plan / Recommendation Clinical Impression   Pt presents with mild-moderate dysarthria and L inattention/neglect per informal speech/language/cognitive assessment. Conversational speech intelligibility observed to range from 50-75%.Dysarthria characterized by reduced articulatory precision and reduced volume. Improved intelligibility  with implementation of over-articulation strategy and increased volume. Expressive and receptive language functions appeared grossly WNL, though further assessment with structured tasks is recommended. Pt would benefit from formal cognitive screen to objectively assesses cognitive functions, though R gaze preference is noted.   Continue SLP intervention to address speech and cognitive goals with further language assessment as needed.     SLP Assessment  SLP Recommendation/Assessment: Patient needs continued Speech Lanaguage Pathology Services SLP Visit Diagnosis: Dysphagia, unspecified (R13.10);Dysarthria and anarthria (R47.1);Attention and concentration deficit Attention and concentration deficit following: Cerebral infarction    Recommendations for follow up therapy are one component of a multi-disciplinary discharge planning process, led by the attending physician.  Recommendations may be updated based on patient status, additional functional criteria and insurance authorization.    Follow Up Recommendations  Acute inpatient rehab (3hours/day)    Assistance Recommended at Discharge   (Defer to PT/OT recommendations)  Functional Status Assessment Patient has had a recent decline in their functional status and demonstrates the ability to make significant improvements in function in a reasonable and predictable amount of time.  Frequency and Duration min 2x/week  4 weeks      SLP Evaluation Cognition  Overall Cognitive Status:  (further assessment needed) Arousal/Alertness: Awake/alert Orientation Level: Oriented X4 Year: 2025 Month: April Attention: Focused Focused Attention: Impaired Focused Attention Impairment: Functional basic (L inattention/neglect) Memory: Appears intact Awareness: Appears intact Problem Solving: Appears intact (further assessment needed)       Comprehension  Auditory Comprehension Overall Auditory Comprehension: Appears within functional limits for tasks  assessed (further assessment needed) Commands: Within Functional Limits (1-step)    Expression  Expression Primary Mode of Expression: Verbal Verbal Expression Overall Verbal Expression: Impaired Initiation: No impairment Automatic Speech: Counting Level of Generative/Spontaneous Verbalization: Sentence Pragmatics: Impairment (flat affect) Impairments: Abnormal affect Non-Verbal Means of Communication: Not applicable Written Expression Written Expression: Not tested   Oral / Motor  Oral Motor/Sensory Function Overall Oral Motor/Sensory Function: Moderate impairment Facial ROM: Reduced left;Suspected CN VII (facial) dysfunction Facial Symmetry: Abnormal symmetry left;Suspected CN VII (facial) dysfunction Facial Strength: Reduced left;Suspected CN VII (facial) dysfunction Facial Sensation: Reduced left;Suspected CN V (Trigeminal) dysfunction Lingual ROM: Within Functional Limits Lingual Symmetry: Abnormal symmetry left;Suspected CN XII (hypoglossal) dysfunction Lingual Strength: Reduced;Suspected CN XII (hypoglossal) dysfunction Velum: Within Functional Limits Mandible: Within Functional Limits Motor Speech Overall Motor Speech: Impaired Respiration: Within functional limits Phonation: Normal Resonance: Within functional limits Articulation: Impaired Level of Impairment: Sentence Intelligibility: Intelligibility reduced Word: 75-100% accurate Phrase: 75-100% accurate Sentence: 50-74% accurate Conversation: 50-74% accurate Motor Speech Errors: Not applicable Effective Techniques: Slow rate;Over-articulate            Leverette Read 12/02/2023, 12:05 PM

## 2023-12-02 NOTE — CV Procedure (Signed)
  Device system confirmed to be MRI conditional, with implant date > 6 weeks ago, and no evidence of abandoned or epicardial leads in review of most recent CXR  Device last cleared by EP Provider: Suzann Riddle on 12/01/2023  Clearance is good through for 1 year as long as parameters remain stable at time of check. If pt undergoes a cardiac device procedure during that time, they should be re-cleared.   Tachy-therapies to be programmed off if applicable with device back to pre-MRI settings after completion of exam.  [employer] will be present for programming for the MRI.   Arlys Lamer, RT  12/02/2023 10:55 AM

## 2023-12-02 NOTE — TOC CM/SW Note (Signed)
 Transition of Care [facility]) - Inpatient Brief Assessment   Patient Details  Name: Richard [name] MRN: [id number] Date of Birth: [birth year]  [facility]) CM/SW Contact:    Dorn Hartshorne M, RN Phone Number: 12/02/2023, 3:36 PM   Clinical Narrative: Pt is a 68 y.o. male who presented 12/01/23 with L-sided weakness and slurred speech. CTA showed occlusion of the right M1 segment, proximal M2 branch of the right MCA with intraluminal thrombus. S/p mechanical thrombectomy 4/24. CT showed subtle hypoattenuation involving the right caudate and right lentiform nuclei which may reflect acute infarct.  TOC will continue to follow for discharge needs; please consult as needs arise.    Transition of Care Asessment: Insurance and Status: Insurance coverage has been reviewed Patient has primary care physician: Yes (Dr. Wilene Hang) Home environment has been reviewed: Lives with spouse Prior level of function:: Independent Prior/Current Home Services: No current home services Social Drivers of Health Review: SDOH reviewed needs interventions Readmission risk has been reviewed: Yes Transition of care needs: transition of care needs identified, TOC will continue to follow  Calla Catchings, RN, BSN  Trauma/Neuro ICU Case Manager [phone]

## 2023-12-02 NOTE — Progress Notes (Signed)
 NIR Progress Note:  This is a late entry for encounter that occurred at 645 am.  POD #1 s/p distal right M1 thrombectomy (6 passes) with only modest improvement, robust collateral circulation  Await results of MRI Continue supportive postoperative care. No femoral activity restriction from NIR standpoint. Continue evaluation for stroke etiology Please call with questions or concerns Appreciate excellent multidisciplinary care  Subjective: Headache resolved.  Some itching.  No pain reported.  Objective: Physical Exam: BP 127/85 (BP Location: Left Arm)   Pulse 64   Temp 98.9 F (37.2 C) (Oral)   Resp 16   Ht 5\' 10"  (1.778 m)   Wt 78.6 kg   SpO2 96%   BMI 24.86 kg/m   Awake and alert Answers questions appropriately Left facial droop RRR Nonlabored Left arm flaccid Some movement in left foot, but not antigravity Right femoral site clean No hematoma Palpable distal pulses   Current Facility-Administered Medications:    0.9 %  sodium chloride  infusion, , Intravenous, Continuous, Toberman, Stevi W, NP, Last Rate: 40 mL/hr at 12/02/23 0700, Infusion Verify at 12/02/23 0700   0.9 %  sodium chloride  infusion, 250 mL, Intravenous, Continuous, Synthia Ewing, RPH   0.9 %  sodium chloride  infusion, 250 mL, Intravenous, Continuous, Autry, Lauren E, PA-C   acetaminophen  (TYLENOL ) tablet 650 mg, 650 mg, Oral, Q4H PRN, 650 mg at 12/02/23 0840 **OR** acetaminophen  (TYLENOL ) 160 MG/5ML solution 650 mg, 650 mg, Per Tube, Q4H PRN **OR** acetaminophen  (TYLENOL ) suppository 650 mg, 650 mg, Rectal, Q4H PRN, Toberman, Stevi W, NP   Chlorhexidine  Gluconate Cloth 2 % PADS 6 each, 6 each, Topical, Daily, Eleni Griffin, MD, 6 each at 12/01/23 1500   dolutegravir -lamiVUDine  (DOVATO ) 50-300 MG per tablet 1 tablet, 1 tablet, Oral, Daily, Lalla Pill, PA-C, 1 tablet at 12/02/23 1610   enoxaparin  (LOVENOX ) injection 40 mg, 40 mg, Subcutaneous, Q24H, Toberman, Stevi W, NP, 40 mg at 12/02/23  9604   famotidine  (PEPCID ) tablet 20 mg, 20 mg, Oral, Daily, Autry, Lauren E, PA-C, 20 mg at 12/02/23 0920   norepinephrine  (LEVOPHED ) 4mg  in (0.016 mg/mL) premix infusion, 0-10 mcg/min, Intravenous, Titrated, Agarwala, Ravi, MD, Last Rate: 26.3 mL/hr at 12/02/23 0700, 7 mcg/min at 12/02/23 0700   Oral care mouth rinse, 15 mL, Mouth Rinse, PRN, Eleni Griffin, MD   senna-docusate (Senokot-S) tablet 1 tablet, 1 tablet, Oral, QHS PRN, Toberman, Stevi W, NP   sodium chloride  0.9 % bolus 250 mL, 250 mL, Intravenous, PRN, Reagan Camera, MD   sodium chloride  flush (NS) 0.9 % injection 3 mL, 3 mL, Intravenous, Once, Trish Furl, MD  Labs: CBC Recent Labs    12/01/23 1029 12/01/23 1031 12/01/23 1616  WBC 4.4  --  6.7  HGB 14.6 15.3 13.9  HCT 44.2 45.0 41.1  PLT 122*  --  153   BMET Recent Labs    12/01/23 1029 12/01/23 1031 12/01/23 1456  NA 138 140  --   K 4.3 4.3  --   CL 106 105  --   CO2 23  --   --   GLUCOSE 100* 96  --   BUN 13 15  --   CREATININE 1.33* 1.50* 1.34*  CALCIUM  9.2  --   --    LFT Recent Labs    12/01/23 1029  PROT 7.6  ALBUMIN 4.1  AST 25  ALT 36  ALKPHOS 54  BILITOT 0.8   PT/INR Recent Labs    12/01/23 1029  LABPROT 14.8  INR 1.1     LOS: 1 day   I spent a total of 15 minutes in face to face in clinical consultation, greater than 50% of which was counseling/coordinating care.  Richard [name] 12/02/2023 10:10 AM

## 2023-12-02 NOTE — Progress Notes (Addendum)
 STROKE TEAM PROGRESS NOTE    SIGNIFICANT HOSPITAL EVENTS 4/24: Patient admitted with right MCA occlusion, mechanical thrombectomy performed with TICI 2a revascularization achieved  INTERIM HISTORY/SUBJECTIVE  Patient's Tmax was 99.5.  He continues to require low-dose norepinephrine  to maintain blood pressure within parameters.  MRI reveals moderate size acute infarct in the right MCA distribution.  Chronic occlusion of the left ICA.  OBJECTIVE  CBC    Component Value Date/Time   WBC 6.7 12/01/2023 1616   RBC 4.95 12/01/2023 1616   HGB 13.9 12/01/2023 1616   HCT 41.1 12/01/2023 1616   PLT 153 12/01/2023 1616   MCV 83.0 12/01/2023 1616   MCH 28.1 12/01/2023 1616   MCHC 33.8 12/01/2023 1616   RDW 15.3 12/01/2023 1616   LYMPHSABS 1.0 12/01/2023 1029   MONOABS 0.4 12/01/2023 1029   EOSABS 0.1 12/01/2023 1029   BASOSABS 0.0 12/01/2023 1029    BMET    Component Value Date/Time   NA 140 12/01/2023 1031   NA 139 02/03/2023 0950   K 4.3 12/01/2023 1031   CL 105 12/01/2023 1031   CO2 23 12/01/2023 1029   GLUCOSE 96 12/01/2023 1031   BUN 15 12/01/2023 1031   BUN 11 02/03/2023 0950   CREATININE 1.34 (H) 12/01/2023 1456   CREATININE 1.38 (H) 10/01/2016 1547   CALCIUM  9.2 12/01/2023 1029   EGFR 51 (L) 02/03/2023 0950   GFRNONAA 58 (L) 12/01/2023 1456    IMAGING past 24 hours ECHOCARDIOGRAM COMPLETE BUBBLE STUDY Result Date: 12/02/2023    ECHOCARDIOGRAM REPORT   Patient Name:   Richard [name] Sacks Date of Exam: 12/02/2023 Medical Rec #:  [id number]           Height:       70.0 in Accession #:    [id number]          Weight:       173.3 lb Date of Birth:  Aug 02, 1956           BSA:          1.964 m Patient Age:    68 years            BP:           107/69 mmHg Patient Gender: M                   HR:           85 bpm. Exam Location:  Inpatient Procedure: 2D Echo, Cardiac Doppler and Color Doppler (Both Spectral and Color            Flow Doppler were utilized during procedure). Indications:     Stroke  History:        Patient has prior history of Echocardiogram examinations, most                 recent 03/03/2023. Cardiomyopathy and CHF, CAD, Abnormal ECG and                 Defibrillator, Stroke, Arrythmias:VT, Signs/Symptoms:Altered                 Mental Status; Risk Factors:Hypertension and Dyslipidemia.  Sonographer:    Raynelle Callow RDCS Referring Phys: [id number] [facility] IMPRESSIONS  1. Left ventricular ejection fraction, by estimation, is 20 to 25%. The left ventricle has severely decreased function. The left ventricle demonstrates global hypokinesis. The left ventricular internal cavity size was mildly dilated. There is mild concentric left ventricular hypertrophy. Left ventricular diastolic parameters  are consistent with Grade I diastolic dysfunction (impaired relaxation).  2. Right ventricular systolic function is moderately reduced. The right ventricular size is mildly enlarged. The estimated right ventricular systolic pressure is 16.5 mmHg.  3. Left atrial size was mildly dilated.  4. The mitral valve is normal in structure. Moderate mitral valve regurgitation. No evidence of mitral stenosis.  5. The aortic valve is tricuspid. There is mild calcification of the aortic valve. Aortic valve regurgitation is not visualized. No aortic stenosis is present.  6. The inferior vena cava is normal in size with greater than 50% respiratory variability, suggesting right atrial pressure of 3 mmHg.  7. Agitated saline contrast bubble study was negative, with no evidence of any interatrial shunt. FINDINGS  Left Ventricle: Left ventricular ejection fraction, by estimation, is 20 to 25%. The left ventricle has severely decreased function. The left ventricle demonstrates global hypokinesis. The left ventricular internal cavity size was mildly dilated. There is mild concentric left ventricular hypertrophy. Left ventricular diastolic parameters are consistent with Grade I diastolic dysfunction (impaired  relaxation). Right Ventricle: The right ventricular size is mildly enlarged. No increase in right ventricular wall thickness. Right ventricular systolic function is moderately reduced. The tricuspid regurgitant velocity is 1.84 m/s, and with an assumed right atrial pressure of 3 mmHg, the estimated right ventricular systolic pressure is 16.5 mmHg. Left Atrium: Left atrial size was mildly dilated. Right Atrium: Right atrial size was normal in size. Pericardium: There is no evidence of pericardial effusion. Mitral Valve: The mitral valve is normal in structure. Moderate mitral valve regurgitation. No evidence of mitral valve stenosis. Tricuspid Valve: The tricuspid valve is normal in structure. Tricuspid valve regurgitation is trivial. Aortic Valve: The aortic valve is tricuspid. There is mild calcification of the aortic valve. Aortic valve regurgitation is not visualized. No aortic stenosis is present. Pulmonic Valve: The pulmonic valve was normal in structure. Pulmonic valve regurgitation is trivial. Aorta: The aortic root is normal in size and structure. Venous: The inferior vena cava is normal in size with greater than 50% respiratory variability, suggesting right atrial pressure of 3 mmHg. IAS/Shunts: No atrial level shunt detected by color flow Doppler. Agitated saline contrast bubble study was negative, with no evidence of any interatrial shunt. Additional Comments: A device lead is visualized in the right ventricle.  LEFT VENTRICLE PLAX 2D LVIDd:         6.20 cm      Diastology LVIDs:         5.70 cm      LV e' medial:    4.24 cm/s LV PW:         1.40 cm      LV E/e' medial:  11.1 LV IVS:        1.20 cm      LV e' lateral:   4.90 cm/s LVOT diam:     2.20 cm      LV E/e' lateral: 9.6 LV SV:         66 LV SV Index:   33 LVOT Area:     3.80 cm  LV Volumes (MOD) LV vol d, MOD A2C: 187.0 ml LV vol d, MOD A4C: 177.0 ml LV vol s, MOD A2C: 115.0 ml LV vol s, MOD A4C: 131.0 ml LV SV MOD A2C:     72.0 ml LV SV MOD A4C:      177.0 ml LV SV MOD BP:      57.6 ml RIGHT VENTRICLE  IVC RV S prime:     6.96 cm/s  IVC diam: 1.10 cm TAPSE (M-mode): 1.2 cm LEFT ATRIUM           Index        RIGHT ATRIUM           Index LA diam:      2.20 cm 1.12 cm/m   RA Area:     10.90 cm LA Vol (A2C): 25.8 ml 13.14 ml/m  RA Volume:   23.40 ml  11.92 ml/m LA Vol (A4C): 37.6 ml 19.15 ml/m  AORTIC VALVE LVOT Vmax:   101.00 cm/s LVOT Vmean:  63.900 cm/s LVOT VTI:    0.173 m  AORTA Ao Root diam: 3.70 cm MITRAL VALVE                TRICUSPID VALVE MV Area (PHT): 3.99 cm     TR Peak grad:   13.5 mmHg MV Decel Time: 190 msec     TR Vmax:        184.00 cm/s MV E velocity: 47.20 cm/s MV A velocity: 111.00 cm/s  SHUNTS MV E/A ratio:  0.43         Systemic VTI:  0.17 m                             Systemic Diam: 2.20 cm Dalton McleanMD Electronically signed by Archer Bear Signature Date/Time: 12/02/2023/2:08:55 PM    Final    MR BRAIN WO CONTRAST Result Date: 12/02/2023 CLINICAL DATA:  Stroke workup EXAM: MRI HEAD WITHOUT CONTRAST TECHNIQUE: Multiplanar, multiecho pulse sequences of the brain and surrounding structures were obtained without intravenous contrast. COMPARISON:  Head CT and CTA from yesterday FINDINGS: Brain: Patchy acute infarction in the right MCA distribution along the temporal cortex/white matter, posterior insula (which is thin and may have been previously affected by infarct), and posterior striatum/corona radiata. New acute hemorrhage, hydrocephalus, mass, or collection. Vascular: Absent right MCA flow void at the level of the lower insula, noted angiographic findings. Absent left ICA flow void with intracranial normalization. Skull and upper cervical spine: Normal marrow signal Sinuses/Orbits: Negative IMPRESSION: Moderate acute infarction in the right MCA distribution. Slow or absent flow in the right MCA beginning at the lower sylvian fissure. 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  Electronically Signed   By: Ronnette Coke M.D.   On: 12/02/2023 11:26    Vitals:   12/02/23 1200 12/02/23 1300 12/02/23 1323 12/02/23 1400  BP: (!) 134/92 99/70 107/69 (!) 135/91  Pulse:    (!) 169  Resp:   (!) 30 17  Temp:      TempSrc:      SpO2:    100%  Weight:      Height:         PHYSICAL EXAM General:  Alert, well-nourished, well-developed patient in no acute distress Psych:  Mood and affect appropriate for situation CV: Regular rate and rhythm on monitor Respiratory:  Regular, unlabored respirations on supplemental O2   NEURO:  Mental Status: AA&Ox3, patient is able to give some history Speech/Language: speech is with moderate dysarthria but no aphasia  Cranial Nerves:  II: PERRL. Visual fields full.  III, IV, VI: EOMI. Eyelids elevate symmetrically.  V: Sensation is intact to light touch and symmetrical to face.  VII: Left facial droop VIII: hearing intact to voice. IX, X: Voice is mildly dysarthric XII: tongue is midline without fasciculations.  Motor: 5/5 strength to right upper and lower extremities, no movement of left upper extremity, 1/5 movement of left lower extremity Tone: is normal and bulk is normal Sensation- Intact to light touch bilaterally. Extinction to DSS present on the left Coordination: FTN intact on the right Gait- deferred  Most Recent NIH  1a Level of Conscious.: 0 1b LOC Questions: 0 1c LOC Commands: 0 2 Best Gaze: 0 3 Visual: 0 4 Facial Palsy: 1 5a Motor Arm - left: 4 5b Motor Arm - Right: 0 6a Motor Leg - Left: 0 6b Motor Leg - Right: 3 7 Limb Ataxia: 0 8 Sensory: 1 9 Best Language: 0 10 Dysarthria: 1 11 Extinct. and Inatten.: 1 TOTAL: 11   ASSESSMENT/PLAN  [name] is a 68 y.o. male with history of stroke, HIV infection, CAD, MI, ischemic cardiomyopathy, CHF, ICD, V. tach and hyperlipidemia admitted for acute onset dysarthria, left-sided weakness and left facial droop.  He was found to have right MCA  occlusion and was taken to IR for mechanical thrombectomy with  TICI 2a revascularization achieved.  MRI reveals acute infarct in the right MCA distribution.  NIH on Admission 11  Acute Ischemic Infarct:  right MCA territory infarct s/p mechanical thrombectomy Etiology: Right M1 occlusion likely cardioembolic etiology Code Stroke CT head hypoattenuation involving right caudate and right lentiform nuclei possibly reflecting acute infarct, asymmetric density of right M1 segment ASPECTS 8 CTA head & neck occlusion of right M1 segment as well as proximal M2 branch of the right MCA with intraluminal thrombus noted CT perfusion 0 mL core and 143 mL penumbra in right MCA territory MRI moderate acute infarct in right MCA distribution 2D Echo EF 20 to 25%, mild concentric LVH, grade 1 diastolic dysfunction, moderately reduced right ventricular systolic function, mildly dilated left atrium, no interatrial shunt LDL 37 HgbA1c 5.6 VTE prophylaxis -Lovenox  aspirin  81 mg daily prior to admission, now on aspirin  81 mg daily and clopidogrel  75 mg daily for 3 weeks and then Plavix  alone. Therapy recommendations:  CIR Disposition: Pending  Hx of Stroke/TIA Patient has a history of a stroke in 2008 with no residual deficits  CHF Patient has history of CHF status post ICD implantation EF 20 to 25% Consider TEE to evaluate for left ventricular thrombus  History of hypertension, now hypotensive Home meds: Metoprolol  XL 12.5 mg daily Unstable, requiring norepinephrine  Blood Pressure Goal: SBP 120-160 for first 24 hours then less than 180   Hyperlipidemia Home meds: Atorvastatin  40 mg daily and ezetimibe  10 mg daily, resumed in hospital LDL 37, goal < 70 Continue statin at discharge  Dysphagia Patient has post-stroke dysphagia, SLP consulted    Diet   DIET DYS 3 Room service appropriate? Yes with Assist; Fluid consistency: Thin   Advance diet as tolerated  Other Stroke Risk Factors Coronary artery  disease Congestive heart failure   Other Active Problems HIV infection-continue home Dovato   Hospital day # 1  Patient seen by NP with MD, MD to edit note as needed. Cortney E Bucky Cardinal , MSN, AGACNP-BC [facility] See Amion for schedule and pager information 12/02/2023 3:03 PM   I have personally obtained history,examined this patient, reviewed notes, independently viewed imaging studies, participated in medical decision making and plan of care.ROS completed by me personally and pertinent positives fully documented  I have made any additions or clarifications directly to the above note. Agree with note above.  Patient presented with right M1 occlusion and underwent mechanical thrombectomy  with partial recanalization.  MRI shows moderate-sized right-sided infarct.  Recommend close neurological monitoring and strict blood pressure control as per post thrombectomy protocol.  Wean off vasopressors keep systolic 120 and above.  Mobilize out of bed.  Therapy consults.  Continue aspirin  and Plavix  for now but will need to interrogate pacemaker and if A-fib is found may need to be switched to anticoagulation.  Long discussion with patient and family at the bedside and answered questions.  Discussed with Dr. Agarwala critical care medicine. This patient is critically ill and at significant risk of neurological worsening, death and care requires constant monitoring of vital signs, hemodynamics,respiratory and cardiac monitoring, extensive review of multiple databases, frequent neurological assessment, discussion with family, other specialists and medical decision making of high complexity.I have made any additions or clarifications directly to the above note.This critical care time does not reflect procedure time, or teaching time or supervisory time of PA/NP/Med Resident etc but could involve care discussion time.  I spent 30 minutes of neurocritical care time  in the care of  this patient.      Ardella Beaver, MD Medical Director [facility] Pager: [phone] 12/02/2023 4:41 PM  To contact Stroke Continuity provider, please refer to [email or web address]. After hours, contact General Neurology

## 2023-12-02 NOTE — Procedures (Signed)
 Objective Swallowing Evaluation: Type of Study: FEES-Fiberoptic Endoscopic Evaluation of Swallow   Patient Details  Name: Richard [name] MRN: [id number] Date of Birth: 1955-10-06  Today's Date: 12/02/2023 Time: SLP Start Time (ACUTE ONLY): 1115 -SLP Stop Time (ACUTE ONLY): 1145  SLP Time Calculation (min) (ACUTE ONLY): 30 min   Past Medical History:  Past Medical History:  Diagnosis Date   Coronary artery disease    a. cath 04/02/2014 3v dx 80-90% in D1, 80-90% mid LAD, 95% apical LAD, 95% distal inferior LV branch of LCx, total occlusion of prox RCA  b). Cath 04/03/2014 DES to mid LAD, DES x3 to prox D1, DAPT indefinitely. LCx lesion untreated   GERD (gastroesophageal reflux disease)    HIV (human immunodeficiency virus infection) (HCC)    Hypertension    Ischemic cardiomyopathy 08/09/2013   EF 20-25 percent by echo in 04/2014, s/p [employer] Assura model [id number] serial number [id number]   Stroke [facility])    Past Surgical History:  Past Surgical History:  Procedure Laterality Date   CARDIAC CATHETERIZATION  04/02/2014   DR Loetta Ringer   ESOPHAGOGASTRODUODENOSCOPY (EGD) WITH PROPOFOL  N/A 09/08/2018   Procedure: ESOPHAGOGASTRODUODENOSCOPY (EGD) WITH PROPOFOL ;  Surgeon: Alvis Jourdain, MD;  Location: [facility] ENDOSCOPY;  Service: Endoscopy;  Laterality: N/A;   IMPLANTABLE CARDIOVERTER DEFIBRILLATOR IMPLANT N/A 09/10/2014   Procedure: IMPLANTABLE CARDIOVERTER DEFIBRILLATOR IMPLANT;  Surgeon: Luana Rumple, MD;  [facility] model [id number] serial number [id number]   IR CT HEAD LTD  12/01/2023   IR PERCUTANEOUS ART THROMBECTOMY/INFUSION INTRACRANIAL INC DIAG ANGIO  12/01/2023   IR US  GUIDE VASC ACCESS RIGHT  12/01/2023   LEFT AND RIGHT HEART CATHETERIZATION WITH CORONARY ANGIOGRAM N/A 04/02/2014   Procedure: LEFT AND RIGHT HEART CATHETERIZATION WITH CORONARY ANGIOGRAM;  Surgeon: Millicent Ally, MD;  Location: [facility] C[facility] LAB;  Service: Cardiovascular;  Laterality: N/A;   MEDIAL COLLATERAL  LIGAMENT REPAIR, KNEE Right 08/25/2015   Procedure: RIGHT THUMB RADICAL COLLATERAL LIGAMENT REPAIR;  Surgeon: Florida Hurter, MD;  Location: [facility] OR;  Service: Orthopedics;  Laterality: Right;   PERCUTANEOUS CORONARY STENT INTERVENTION (PCI-S) N/A 04/03/2014   Procedure: PERCUTANEOUS CORONARY STENT INTERVENTION (PCI-S);  Surgeon: Peter M Swaziland, MD;  Location: [facility] CATH LAB;  Service: Cardiovascular;  Laterality: N/A;   RADIOLOGY WITH ANESTHESIA N/A 12/01/2023   Procedure: RADIOLOGY WITH ANESTHESIA;  Surgeon: Radiologist, Medication, MD;  Location: [facility] OR;  Service: Radiology;  Laterality: N/A;  Juana Nones DILATION N/A 09/08/2018   Procedure: SAVORY DILATION;  Surgeon: Alvis Jourdain, MD;  Location: [facility] ENDOSCOPY;  Service: Endoscopy;  Laterality: N/A;   HPI: Richard [name] is a 68 y.o. 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  Patient was last seen in his usual state of health last night at 6 PM when his wife came to the hospital to see a family member.  This morning, she called her husband and he initially did not answer but when she called again, she noticed that his speech was slurred and he asked her to come home because something was not right.  When his wife got home, she found the patient on the floor with left-sided weakness, left-sided facial droop, and slurred speech with complaints of a severe headache prompting EMS activation.  On EMS  arrival, patient's symptoms were persistent and a code stroke was activated.   Subjective: Pt alert and upright in bed. Multiple family members at bedside. RN consulted and present to witness consent for FEES.    Recommendations for follow up therapy are one component of a  multi-disciplinary discharge planning process, led by the attending physician.  Recommendations may be updated based on patient status, additional functional criteria and insurance authorization.  Assessment / Plan / Recommendation  Transnasal endoscope passed through L naris without incidence. Pt tolerated the procedure well until the last swallow when he pulled out the scope because he felt like he would "throw up". Secretion management appeared [facility]. There appeared to be reduced movement of L true vocal cord creating a glottic gap with voicing. No structural or anatomical abnormalities observed.   Pt presents with a mild oropharyngeal dysphagia per results of FEES today. No confirmed aspiration events observed, though possible aspiration event may have occurred during final swallow of liquid wash given significant coughing spell. Unable to confirm or rule out aspiration due to pt removing scope. Coughing may have also occurred due to gag response to scope presence.   Oral deficits characterized by reduced oral strength and control which resulted in anterior loss of applesauce and solid trials from L side. There was posterior spillage of thin liquids by cup before/during the swallow, which contributed to deep penetration of thin liquids before/during the swallow. Penetration cleared during swallow or with cued throat clear. Pt appeared to have improved oral control with straw use, though deep penetration was observed with large, consecutive straw sips.   Pharyngeal deficits characterized by intermittent mistiming of pharyngeal swallow initiation with reduced laryngeal vestibule closure and reduced pharyngeal stripping. Trace residue of applesauce and cracker was cleared with volitional second swallow or liquid wash.   Education/Treatment: SLP reviewed FEES findings, images, and recommendations with pt and family following study. Discussed aspiration precautions and compensatory swallow strategies in  detail. Pt and family verbalized good understanding.   Recommend a mechanical soft (D3) diet and thin liquids as tolerated. PO meds whole in applesauce or pudding. Follow compensatory swallow strategy of small single bites and sips, slow pace, and throat clear + dry swallow as needed.   Plan: SLP will follow up to assess diet tolerance and modify/advance diet as indicated.        12/02/2023   12:18 PM  Clinical Impressions     SLP Visit Diagnosis Dysphagia, unspecified (R13.10);Dysarthria and anarthria (R47.1);Attention and concentration deficit  Attention and concentration deficit following Cerebral infarction     Impact on safety and function Mild aspiration risk         12/02/2023   12:18 PM  Treatment Recommendations  Treatment Recommendations Therapy as outlined in treatment plan below        12/02/2023   12:19 PM  Prognosis  Prognosis for improved oropharyngeal function Good  Barriers to Reach Goals --  Barriers/Prognosis Comment --       12/02/2023   12:18 PM  Diet Recommendations  SLP Diet Recommendations Dysphagia 3 (Mech soft) solids;Thin liquid  Liquid Administration via Cup;Straw  Medication Administration Whole meds with puree  Compensations Slow rate;Small sips/bites  Postural Changes --         12/02/2023   12:18 PM  Other Recommendations  Recommended Consults --  Oral Care Recommendations Oral care BID  Caregiver Recommendations --  Follow Up Recommendations Acute inpatient rehab (3hours/day)  Assistance recommended at discharge --  Functional Status Assessment Patient  has had a recent decline in their functional status and demonstrates the ability to make significant improvements in function in a reasonable and predictable amount of time.       12/02/2023   12:18 PM  Frequency and Duration   Speech Therapy Frequency (ACUTE ONLY) min 2x/week  Treatment Duration 4 weeks         12/02/2023   12:11 PM  Oral Phase  Oral Phase Impaired  Oral -  Pudding Teaspoon --  Oral - Pudding Cup --  Oral - Honey Teaspoon --  Oral - Honey Cup --  Oral - Nectar Teaspoon --  Oral - Nectar Cup --  Oral - Nectar Straw --  Oral - Thin Teaspoon NT  Oral - Thin Cup Premature spillage  Oral - Thin Straw WFL  Oral - Puree Left anterior bolus loss  Oral - Mech Soft --  Oral - Regular Left anterior bolus loss  Oral - Multi-Consistency --  Oral - Pill --  Oral Phase - Comment --       12/02/2023   12:12 PM  Pharyngeal Phase  Pharyngeal Phase Impaired                                            Pharyngeal- Thin Teaspoon NT     Pharyngeal- Thin Cup Reduced airway/laryngeal closure  Pharyngeal Material enters airway, CONTACTS cords and then ejected out  Pharyngeal- Thin Straw Penetration/Aspiration during swallow  Pharyngeal Material enters airway, CONTACTS cords and then ejected out  Pharyngeal- Puree WFL  Pharyngeal Material does not enter airway  Pharyngeal- Mechanical Soft NT  Pharyngeal --  Pharyngeal- Regular Pharyngeal residue - valleculae  Pharyngeal Material does not enter airway  Pharyngeal- Multi-consistency NT  Pharyngeal --  Pharyngeal- Pill NT  Pharyngeal --  Pharyngeal Comment --        12/02/2023   12:18 PM  Cervical Esophageal Phase   Cervical Esophageal Phase [facility]                                                     Leverette Read 12/02/2023, 12:20 PM

## 2023-12-02 NOTE — Evaluation (Signed)
 Physical Therapy Evaluation Patient Details Name: Richard [name] MRN: [id number] DOB: 11-17-1955 Today's Date: 12/02/2023  History of Present Illness  Pt is a 68 y.o. male who presented 12/01/23 with L-sided weakness and slurred speech. CTA showed occlusion of the right M1 segment, proximal M2 branch of the right MCA with intraluminal thrombus. S/p mechanical thrombectomy 4/24. CT showed subtle hypoattenuation involving the right caudate and right lentiform nuclei which may reflect acute infarct. MRI pending. PMH: CAD s/p CABG x3 2015, ischemic cardiomyopathy s/p ICD placement, HTN, HLD, HIV, GERD, CVA in 2008 without residual deficits   Clinical Impression  Pt presents with condition above and deficits mentioned below, see PT Problem List. PTA, he was independent without DME, living with  his wife in a 1-level house with 2 STE. Currently, pt displays L inattention along with deficits in L-sided strength, resulting in him needing mod-maxAx2 for bed mobility and modAx2 to transfer to stand with bil HHA today. He displays deficits also in balance and activity tolerance and is at high risk for falls. He is highly motivated to participate and improve and could greatly benefit from intensive inpatient rehab, > 3 hours/day, as he has had a drastic functional decline. Will continue to follow acutely.        If plan is discharge home, recommend the following: Two people to help with walking and/or transfers;Two people to help with bathing/dressing/bathroom;Assistance with cooking/housework;Assist for transportation;Help with stairs or ramp for entrance   Can travel by private vehicle        Equipment Recommendations Other (comment) (TBD)  Recommendations for Other Services  Rehab consult    Functional Status Assessment Patient has had a recent decline in their functional status and demonstrates the ability to make significant improvements in function in a reasonable and predictable amount of time.      Precautions / Restrictions Precautions Precautions: Fall;Other (comment) Recall of Precautions/Restrictions: Impaired Precaution/Restrictions Comments: SBP 130-180; watch HR Restrictions Weight Bearing Restrictions Per Provider Order: No      Mobility  Bed Mobility Overal bed mobility: Needs Assistance Bed Mobility: Rolling, Sidelying to Sit, Sit to Supine Rolling: Mod assist, +2 for physical assistance, +2 for safety/equipment Sidelying to sit: Mod assist, +2 for physical assistance, +2 for safety/equipment   Sit to supine: Max assist, +2 for physical assistance, +2 for safety/equipment   General bed mobility comments: Mod A of 2 to roll to the L and manage L extremities, benefitting from tactile cues, assist required to come into sitting due to decreased activation on L side, posterior and R lateral lean sitting EOB, able to have moments of min A sitting balance. MaxAx2 to manage trunk and legs back to supine    Transfers Overall transfer level: Needs assistance Equipment used: 2 person hand held assist Transfers: Sit to/from Stand Sit to Stand: Mod assist, +2 physical assistance, +2 safety/equipment           General transfer comment: PT and OT completing 2 person HHA, good initiation noted on R side, max cues to bring head up, and attempt to find the middle, R lean noted in standing, unable to weight shift without max A    Ambulation/Gait               General Gait Details: deferred  Stairs            Wheelchair Mobility     Tilt Bed    Modified Rankin (Stroke Patients Only) Modified Rankin (Stroke Patients Only) Pre-Morbid Rankin Score:  No symptoms Modified Rankin: Severe disability     Balance Overall balance assessment: Needs assistance Sitting-balance support: Single extremity supported, Feet supported Sitting balance-Leahy Scale: Poor Sitting balance - Comments: reliant on external assist, posterior and R lateral lean sitting EOB,  able to have moments of min A sitting balance Postural control: Right lateral lean, Posterior lean Standing balance support: Bilateral upper extremity supported, During functional activity, Reliant on assistive device for balance Standing balance-Leahy Scale: Zero Standing balance comment: reliant on PT and OT and cues to extend hips, knees, and trunk and neck                             Pertinent Vitals/Pain Pain Assessment Pain Assessment: No/denies pain    Home Living Family/patient expects to be discharged to:: Private residence Living Arrangements: Spouse/significant other Available Help at Discharge: Family;Available 24 hours/day Type of Home: House Home Access: Stairs to enter Entrance Stairs-Rails: Right Entrance Stairs-Number of Steps: 2   Home Layout: One level Home Equipment: None      Prior Function Prior Level of Function : Independent/Modified Independent;Driving             Mobility Comments: independent ADLs Comments: independent, likes sports     Extremity/Trunk Assessment   Upper Extremity Assessment Upper Extremity Assessment: Defer to OT evaluation    Lower Extremity Assessment Lower Extremity Assessment: LLE deficits/detail LLE Deficits / Details: trace quads and gastrocs activation noted when cued, noted improved quads activation when standing, generally very weak in L leg; able to detect touch    Cervical / Trunk Assessment Cervical / Trunk Assessment: Normal  Communication   Communication Communication: Impaired Factors Affecting Communication: Reduced clarity of speech    Cognition Arousal: Alert Behavior During Therapy: WFL for tasks assessed/performed   PT - Cognitive impairments: Awareness                       PT - Cognition Comments: decreased attention to L side and awareness of this deficit, needing cues to turn head to L often Following commands: Intact       Cueing Cueing Techniques: Verbal cues,  Tactile cues, Gestural cues     General Comments General comments (skin integrity, edema, etc.): HR up to 130 BPM in standing    Exercises     Assessment/Plan    PT Assessment Patient needs continued PT services  PT Problem List Decreased strength;Decreased balance;Decreased activity tolerance;Decreased mobility;Decreased coordination;Cardiopulmonary status limiting activity       PT Treatment Interventions DME instruction;Gait training;Stair training;Functional mobility training;Therapeutic activities;Therapeutic exercise;Balance training;Neuromuscular re-education;Cognitive remediation;Patient/family education    PT Goals (Current goals can be found in the Care Plan section)  Acute Rehab PT Goals Patient Stated Goal: to improve PT Goal Formulation: With patient/family Time For Goal Achievement: 12/16/23 Potential to Achieve Goals: Good    Frequency Min 3X/week     Co-evaluation PT/OT/SLP Co-Evaluation/Treatment: Yes Reason for Co-Treatment: Complexity of the patient's impairments (multi-system involvement);Necessary to address cognition/behavior during functional activity;For patient/therapist safety;To address functional/ADL transfers PT goals addressed during session: Mobility/safety with mobility;Balance;Proper use of DME;Strengthening/ROM         AM-PAC PT "6 Clicks" Mobility  Outcome Measure Help needed turning from your back to your side while in a flat bed without using bedrails?: Total Help needed moving from lying on your back to sitting on the side of a flat bed without using bedrails?: Total Help needed moving  to and from a bed to a chair (including a wheelchair)?: Total Help needed standing up from a chair using your arms (e.g., wheelchair or bedside chair)?: Total Help needed to walk in hospital room?: Total Help needed climbing 3-5 steps with a railing? : Total 6 Click Score: 6    End of Session Equipment Utilized During Treatment: Gait belt Activity  Tolerance: Patient tolerated treatment well Patient left: in bed;with call bell/phone within reach;with bed alarm set;with family/visitor present Nurse Communication: Mobility status PT Visit Diagnosis: Unsteadiness on feet (R26.81);Other abnormalities of gait and mobility (R26.89);Muscle weakness (generalized) (M62.81);Difficulty in walking, not elsewhere classified (R26.2);Other symptoms and signs involving the nervous system (R29.898);Hemiplegia and hemiparesis Hemiplegia - Right/Left: Right Hemiplegia - dominant/non-dominant: Non-dominant Hemiplegia - caused by: Cerebral infarction    Time: 0930-0959 PT Time Calculation (min) (ACUTE ONLY): 29 min   Charges:   PT Evaluation $PT Eval Moderate Complexity: 1 Mod   PT General Charges $$ ACUTE PT VISIT: 1 Visit         Vernida Goodie, PT, DPT Acute Rehabilitation Services  Office: [phone]   Ellyn Hack 12/02/2023, 5:28 PM

## 2023-12-02 NOTE — Progress Notes (Signed)
 2D echo attempted twice, Speech was with patient 8 am, and then in MRI at 1. Will try later

## 2023-12-02 NOTE — Consult Note (Addendum)
 Advanced Heart Failure Team Consult Note   Primary Physician: Nohemi Batters, MD Cardiologist:  Magnus Schuller, MD  Reason for Consultation: Acute on Chronic Biventricular Heart Failure HPI:    Richard [name] is seen today for evaluation of acute on chronic biventricular heart failure at the request of Dr. Doretta Gant.    Richard [name] is Richard 68 y.o. 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 Has followed with [facility] since 2015.  Most recent VT episode in 2023, monomorphic lasting 255 ms successfully terminated by ATP.   Last seen in Cardiology Clinic in 7/24. He had been doing well, NYHA I. However had stopped all of his cardiac medications due to dizziness. He was restarted on low dose GDMT. Last known echo was 6/24 with EF 30-35%, G1DD, mod LAE. He is currently enrolled in iCM.   He presented to [facility] 12/01/23 for left facial droop, slurred speech and L sided weakness. CT head showed R MCA M1 occlusion. Outside the window for TNK. He underwent R MCA and M2 thrombectomy. Labs on presentation notable for K 4.3, BUN/Cr 13/1.33, WBC 4.4. Repeat echo completed which showed EF 20-25%, gHK, G1DD, nod reduced RV, mod MR, negative bubble study. Advanced Heart Failure team consulted.   Sitting up in bed family at bedside. MAP >110 on NE 2. Speech improving. No L sided movement.  Home Medications Prior to Admission medications   Medication Sig Start Date End Date Taking? Authorizing Provider  aspirin  81 MG chewable tablet Chew 81 mg by mouth daily.    [provider]  atorvastatin  (LIPITOR) 40 MG tablet Take 1 tablet by mouth once daily 07/15/22   Croitoru, Mihai, MD  carboxymethylcellulose (REFRESH PLUS) 0.5 % SOLN INSTILL 1 DROP IN BOTH EYES FOUR TIMES Richard DAY 07/09/20   [provider]  cetirizine (ZYRTEC) 10 MG tablet Take 10 mg by mouth daily.    [provider]   dolutegravir -lamiVUDine  (DOVATO ) 50-300 MG tablet Take 1 tablet by mouth daily. 09/15/20   [provider]  DULoxetine (CYMBALTA) 30 MG capsule Take 30 mg by mouth 2 (two) times daily.    [provider]  empagliflozin  (JARDIANCE ) 10 MG TABS tablet Take 1 tablet (10 mg total) by mouth daily. 03/07/23   Croitoru, Mihai, MD  ezetimibe  (ZETIA ) 10 MG tablet Take 1 tablet (10 mg total) by mouth daily. 10/30/21   Millicent Ally, MD  famotidine  (PEPCID ) 20 MG tablet Take 20 mg by mouth 2 (two) times daily. 12/28/21   [provider]  fluticasone (FLONASE) 50 MCG/ACT nasal spray Place 2 sprays into both nostrils daily as needed for rhinitis.    [provider]  furosemide  (LASIX ) 20 MG tablet Take 20 mg by mouth daily. Pt says PCP told him to stop med til after this office visit 06/25/14   [provider]  loratadine  (CLARITIN ) 10 MG tablet Take 10 mg by mouth daily. 08/12/15   [provider]  metoprolol  succinate (TOPROL -XL) 25 MG 24 hr tablet Take 0.5 tablets (12.5 mg total) by mouth daily. 03/07/23   Croitoru, Mihai, MD  Multiple Vitamins-Minerals (CENTRUM SILVER PO) Take 2 tablets by mouth daily.    [provider]  oxybutynin (DITROPAN) 5 MG tablet Take 5 mg by mouth in the morning and at bedtime. 08/19/21   [provider]  pantoprazole  (PROTONIX ) 40 MG tablet Take 1 tablet (40 mg  total) by mouth daily as needed. 04/04/14   Meng, Hao, PA  risperiDONE  (RISPERDAL ) 1 MG tablet Take 1.5 mg by mouth at bedtime.    [provider]  tacrolimus (PROTOPIC) 0.1 % ointment Apply topically 2 (two) times daily. 10/22/22   [provider]  Tretinoin, Facial Wrinkles, 0.05 % CREA Apply topically. 10/22/22   [provider]    Past Medical History: Past Medical History:  Diagnosis Date   Coronary artery disease    Richard. cath 04/02/2014 3v dx 80-90% in D1, 80-90% mid LAD, 95% apical LAD, 95% distal inferior LV branch of LCx,  total occlusion of prox RCA  b). Cath 04/03/2014 DES to mid LAD, DES x3 to prox D1, DAPT indefinitely. LCx lesion untreated   GERD (gastroesophageal reflux disease)    HIV (human immunodeficiency virus infection) (HCC)    Hypertension    Ischemic cardiomyopathy 08/09/2013   EF 20-25 percent by echo in 04/2014, s/p St. Jude Fortify [employer]ura model [id number] serial number [id number]   Stroke [facility])     Past Surgical History: Past Surgical History:  Procedure Laterality Date   CARDIAC CATHETERIZATION  04/02/2014   DR Loetta Ringer   ESOPHAGOGASTRODUODENOSCOPY (EGD) WITH PROPOFOL  N/Richard 09/08/2018   Procedure: ESOPHAGOGASTRODUODENOSCOPY (EGD) WITH PROPOFOL ;  Surgeon: Alvis Jourdain, MD;  Location: [facility] ENDOSCOPY;  Service: Endoscopy;  Laterality: N/Richard;   IMPLANTABLE CARDIOVERTER DEFIBRILLATOR IMPLANT N/Richard 09/10/2014   Procedure: IMPLANTABLE CARDIOVERTER DEFIBRILLATOR IMPLANT;  Surgeon: Luana Rumple, MD;  [facility] model [id number] serial number [id number]   IR CT HEAD LTD  12/01/2023   IR PERCUTANEOUS ART THROMBECTOMY/INFUSION INTRACRANIAL INC DIAG ANGIO  12/01/2023   IR US  GUIDE VASC ACCESS RIGHT  12/01/2023   LEFT AND RIGHT HEART CATHETERIZATION WITH CORONARY ANGIOGRAM N/Richard 04/02/2014   Procedure: LEFT AND RIGHT HEART CATHETERIZATION WITH CORONARY ANGIOGRAM;  Surgeon: Millicent Ally, MD;  Location: [facility] CATH LAB;  Service: Cardiovascular;  Laterality: N/Richard;   MEDIAL COLLATERAL LIGAMENT REPAIR, KNEE Right 08/25/2015   Procedure: RIGHT THUMB RADICAL COLLATERAL LIGAMENT REPAIR;  Surgeon: Florida Hurter, MD;  Location: [facility] OR;  Service: Orthopedics;  Laterality: Right;   PERCUTANEOUS CORONARY STENT INTERVENTION (PCI-S) N/Richard 04/03/2014   Procedure: PERCUTANEOUS CORONARY STENT INTERVENTION (PCI-S);  Surgeon: Peter M Swaziland, MD;  Location: [facility] CATH LAB;  Service: Cardiovascular;  Laterality: N/Richard;   RADIOLOGY WITH ANESTHESIA N/Richard 12/01/2023   Procedure: RADIOLOGY WITH ANESTHESIA;  Surgeon: Radiologist, Medication, MD;   Location: [facility] OR;  Service: Radiology;  Laterality: N/Richard;  Juana Nones DILATION N/Richard 09/08/2018   Procedure: SAVORY DILATION;  Surgeon: Alvis Jourdain, MD;  Location: [facility] ENDOSCOPY;  Service: Endoscopy;  Laterality: N/Richard;    Family History: Family History  Problem Relation Age of Onset   Heart disease Mother    Esophageal cancer Neg Hx    Colon cancer Neg Hx     Social History: Social History   Socioeconomic History   Marital status: Married    Spouse name: Not on file   Number of children: 1   Years of education: Not on file   Highest education level: Not on file  Occupational History   Occupation: disable Vet  Tobacco Use   Smoking status: Never   Smokeless tobacco: Never  Vaping Use   Vaping status: Never Used  Substance and Sexual Activity   Alcohol use: No   Drug use: No   Sexual activity: Not on file  Other Topics Concern   Not on file  Social History Narrative  Not on file   Social Drivers of Health   Financial Resource Strain: Not on file  Food Insecurity: Not on file  Transportation Needs: Not on file  Physical Activity: Not on file  Stress: Not on file  Social Connections: Not on file    Allergies:  Allergies  Allergen Reactions   Iodine    Lisinopril  Cough   Ioxaglate Hives   Ivp Dye [Iodinated Contrast Media] Hives    Objective:    Vital Signs:   Temp:  [97.7 F (36.5 C)-99.5 F (37.5 C)] 98.1 F (36.7 C) (04/25 1156) Pulse Rate:  [57-169] 169 (04/25 1400) Resp:  [14-30] 17 (04/25 1400) BP: (99-155)/(69-113) 135/91 (04/25 1400) SpO2:  [94 %-100 %] 100 % (04/25 1400) Last BM Date :  (PTA)  Weight change: Filed Weights   12/01/23 1000  Weight: 78.6 kg   Intake/Output:  Intake/Output Summary (Last 24 hours) at 12/02/2023 1552 Last data filed at 12/02/2023 1400 Gross per 24 hour  Intake 2031.94 ml  Output 1050 ml  Net 981.94 ml    Physical Exam    General: Well appearing. No distress on RA Cardiac: JVP flat. S1 and S2  present. Extremities: Warm and dry.  No edema.  Neuro: Alert and oriented x3. Affect flat. No left sided movement  Telemetry   SR 90 frequent PVCs (personally reviewed)  EKG    No ECG performed  Labs   Basic Metabolic Panel: Recent Labs  Lab 12/01/23 1029 12/01/23 1031 12/01/23 1456  NA 138 140  --   K 4.3 4.3  --   CL 106 105  --   CO2 23  --   --   GLUCOSE 100* 96  --   BUN 13 15  --   CREATININE 1.33* 1.50* 1.34*  CALCIUM  9.2  --   --     Liver Function Tests: Recent Labs  Lab 12/01/23 1029  AST 25  ALT 36  ALKPHOS 54  BILITOT 0.8  PROT 7.6  ALBUMIN 4.1   CBC: Recent Labs  Lab 12/01/23 1029 12/01/23 1031 12/01/23 1616  WBC 4.4  --  6.7  NEUTROABS 2.9  --   --   HGB 14.6 15.3 13.9  HCT 44.2 45.0 41.1  MCV 84.4  --  83.0  PLT 122*  --  153   CBG: Recent Labs  Lab 12/01/23 1027  GLUCAP 99   Coagulation Studies: Recent Labs    12/01/23 1029  LABPROT 14.8  INR 1.1   Imaging   ECHOCARDIOGRAM COMPLETE BUBBLE STUDY Result Date: 12/02/2023    ECHOCARDIOGRAM REPORT   Patient Name:   Richard [name] Seib Date of Exam: 12/02/2023 Medical Rec #:  [id number]           Height:       70.0 in Accession #:    [id number]          Weight:       173.3 lb Date of Birth:  January 20, 1956           BSA:          1.964 m Patient Age:    68 years            BP:           107/69 mmHg Patient Gender: M                   HR:           85 bpm. Exam Location:  Inpatient Procedure: 2D Echo, Cardiac Doppler and Color Doppler (Both Spectral and Color            Flow Doppler were utilized during procedure). Indications:    Stroke  History:        Patient has prior history of Echocardiogram examinations, most                 recent 03/03/2023. Cardiomyopathy and CHF, CAD, Abnormal ECG and                 Defibrillator, Stroke, Arrythmias:VT, Signs/Symptoms:Altered                 Mental Status; Risk Factors:Hypertension and Dyslipidemia.  Sonographer:    Raynelle Callow RDCS Referring Phys:  [id number] [facility] IMPRESSIONS  1. Left ventricular ejection fraction, by estimation, is 20 to 25%. The left ventricle has severely decreased function. The left ventricle demonstrates global hypokinesis. The left ventricular internal cavity size was mildly dilated. There is mild concentric left ventricular hypertrophy. Left ventricular diastolic parameters are consistent with Grade I diastolic dysfunction (impaired relaxation).  2. Right ventricular systolic function is moderately reduced. The right ventricular size is mildly enlarged. The estimated right ventricular systolic pressure is 16.5 mmHg.  3. Left atrial size was mildly dilated.  4. The mitral valve is normal in structure. Moderate mitral valve regurgitation. No evidence of mitral stenosis.  5. The aortic valve is tricuspid. There is mild calcification of the aortic valve. Aortic valve regurgitation is not visualized. No aortic stenosis is present.  6. The inferior vena cava is normal in size with greater than 50% respiratory variability, suggesting right atrial pressure of 3 mmHg.  7. Agitated saline contrast bubble study was negative, with no evidence of any interatrial shunt. FINDINGS  Left Ventricle: Left ventricular ejection fraction, by estimation, is 20 to 25%. The left ventricle has severely decreased function. The left ventricle demonstrates global hypokinesis. The left ventricular internal cavity size was mildly dilated. There is mild concentric left ventricular hypertrophy. Left ventricular diastolic parameters are consistent with Grade I diastolic dysfunction (impaired relaxation). Right Ventricle: The right ventricular size is mildly enlarged. No increase in right ventricular wall thickness. Right ventricular systolic function is moderately reduced. The tricuspid regurgitant velocity is 1.84 m/s, and with an assumed right atrial pressure of 3 mmHg, the estimated right ventricular systolic pressure is 16.5 mmHg. Left Atrium: Left atrial  size was mildly dilated. Right Atrium: Right atrial size was normal in size. Pericardium: There is no evidence of pericardial effusion. Mitral Valve: The mitral valve is normal in structure. Moderate mitral valve regurgitation. No evidence of mitral valve stenosis. Tricuspid Valve: The tricuspid valve is normal in structure. Tricuspid valve regurgitation is trivial. Aortic Valve: The aortic valve is tricuspid. There is mild calcification of the aortic valve. Aortic valve regurgitation is not visualized. No aortic stenosis is present. Pulmonic Valve: The pulmonic valve was normal in structure. Pulmonic valve regurgitation is trivial. Aorta: The aortic root is normal in size and structure. Venous: The inferior vena cava is normal in size with greater than 50% respiratory variability, suggesting right atrial pressure of 3 mmHg. IAS/Shunts: No atrial level shunt detected by color flow Doppler. Agitated saline contrast bubble study was negative, with no evidence of any interatrial shunt. Additional Comments: Richard device lead is visualized in the right ventricle.  LEFT VENTRICLE PLAX 2D LVIDd:         6.20 cm      Diastology LVIDs:  5.70 cm      LV e' medial:    4.24 cm/s LV PW:         1.40 cm      LV E/e' medial:  11.1 LV IVS:        1.20 cm      LV e' lateral:   4.90 cm/s LVOT diam:     2.20 cm      LV E/e' lateral: 9.6 LV SV:         66 LV SV Index:   33 LVOT Area:     3.80 cm  LV Volumes (MOD) LV vol d, MOD A2C: 187.0 ml LV vol d, MOD A4C: 177.0 ml LV vol s, MOD A2C: 115.0 ml LV vol s, MOD A4C: 131.0 ml LV SV MOD A2C:     72.0 ml LV SV MOD A4C:     177.0 ml LV SV MOD BP:      57.6 ml RIGHT VENTRICLE            IVC RV S prime:     6.96 cm/s  IVC diam: 1.10 cm TAPSE (M-mode): 1.2 cm LEFT ATRIUM           Index        RIGHT ATRIUM           Index LA diam:      2.20 cm 1.12 cm/m   RA Area:     10.90 cm LA Vol (A2C): 25.8 ml 13.14 ml/m  RA Volume:   23.40 ml  11.92 ml/m LA Vol (A4C): 37.6 ml 19.15 ml/m  AORTIC  VALVE LVOT Vmax:   101.00 cm/s LVOT Vmean:  63.900 cm/s LVOT VTI:    0.173 m  AORTA Ao Root diam: 3.70 cm MITRAL VALVE                TRICUSPID VALVE MV Area (PHT): 3.99 cm     TR Peak grad:   13.5 mmHg MV Decel Time: 190 msec     TR Vmax:        184.00 cm/s MV E velocity: 47.20 cm/s MV Richard velocity: 111.00 cm/s  SHUNTS MV E/Richard ratio:  0.43         Systemic VTI:  0.17 m                             Systemic Diam: 2.20 cm Dalton McleanMD Electronically signed by Archer Bear Signature Date/Time: 12/02/2023/2:08:55 PM    Final    MR BRAIN WO CONTRAST Result Date: 12/02/2023 CLINICAL DATA:  Stroke workup EXAM: MRI HEAD WITHOUT CONTRAST TECHNIQUE: Multiplanar, multiecho pulse sequences of the brain and surrounding structures were obtained without intravenous contrast. COMPARISON:  Head CT and CTA from yesterday FINDINGS: Brain: Patchy acute infarction in the right MCA distribution along the temporal cortex/white matter, posterior insula (which is thin and may have been previously affected by infarct), and posterior striatum/corona radiata. New acute hemorrhage, hydrocephalus, mass, or collection. Vascular: Absent right MCA flow void at the level of the lower insula, noted angiographic findings. Absent left ICA flow void with intracranial normalization. Skull and upper cervical spine: Normal marrow signal Sinuses/Orbits: Negative IMPRESSION: Moderate acute infarction in the right MCA distribution. Slow or absent flow in the right MCA beginning at the lower sylvian fissure. 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 Electronically Signed   By: Ronnette Coke M.D.   On: 12/02/2023 11:26  Medications:    Current Medications:  aspirin   81 mg Oral Daily   Chlorhexidine  Gluconate Cloth  6 each Topical Daily   clopidogrel   75 mg Oral Daily   dolutegravir -lamiVUDine   1 tablet Oral Daily   enoxaparin  (LOVENOX ) injection  40 mg Subcutaneous Q24H   famotidine   20 mg Oral Daily   sodium  chloride flush  3 mL Intravenous Once   Infusions:  sodium chloride      norepinephrine  (LEVOPHED ) Adult infusion 7 mcg/min (12/02/23 1400)    Patient Profile   Gregg Richard Kossman is Richard 68 y.o. 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   Presenting with acute ischemic CVA.  Assessment/Plan   Acute on Chronic Biventricular Systolic Heart Failure - longstanding history of HFrEF due to severe ICM. EF 20-25% in 2015. - intermittent history of noncompliance - s/p St. Jude single lead ICD in 2015. Enrolled in iCM. - last echo with EF 30-35%, RWMA, G1DD, mildly reduced RV  - Euvolemic. Stop IVF.  - On NE 2, wean as able. SBP per charting >130. - Now HTN hold NE.  - Hold afterload reduction with pressor requirement - Resume GDMT as BP tolerates  2. Severe CAD - LHC in 2015 with 80-90% D1, midLAD 80-90%, apical LAD 95%, distal Lcx 95%, and total prox RRA. S/p DES to mid LAD, DES X3 to prox D1, untreated Lcx lesion.  - No chest pain  - DAPT indefinitely: ASA + Plavix  - continue atorvastatin  40 mg daily, last lipids 108yr ago. Recheck + Lipo(Richard)  3. VT - Last episode noted in 2023 with successful ATP - Enrolled in iCM. Last interrogation with 1 NSVT 14b in 3/25. - on tele NSR  4. CVA - presented with stroke like symptoms - CT head + for acute ischemic stroke to R MCA and M1 - s/p thrombectomy - MRI head showing acute stroke as above and chronic occlusion of L ICA - BP goals now liberalized, wean NE as able. - neurology following  5. CKD 3b - baseline creatinine ~1.3-1.5 - stable at baseline  6. HIV - continue dovato   Length of Stay: 1  CRITICAL CARE Performed by: [place] Lee  Total critical care time: 12 minutes  Critical care time was exclusive of separately billable procedures and treating other patients.  Critical care was necessary to treat or prevent imminent or life-threatening  deterioration.  Critical care was time spent personally by me on the following activities: development of treatment plan with patient and/or surrogate as well as nursing, discussions with consultants, evaluation of patient's response to treatment, examination of patient, obtaining history from patient or surrogate, ordering and performing treatments and interventions, ordering and review of laboratory studies, ordering and review of radiographic studies, pulse oximetry and re-evaluation of patient's condition.  [place] Lee, NP  12/02/2023, 3:52 PM  Advanced Heart Failure Team Pager [phone] (M-F; 7a - 5p)   Agree with above. 68 y/o male with systolic HF due to severe ischemic CM EF ~30%, VT s/p ICD, HIV, schizophrenia.   Admitted with acute CVA due to R MCA occlusion. Underwent thrombectomy. Has dense L-sided weakness   Patient started on NE for BP support. NE weaned off ans SBP dropped to 99 (goal 110) so we are asked to assist.   Currently BP 131/110 on NE 2.  Denies CP or SOB  General: Sitting up in bed No resp difficulty  Mildly dysarthric  HEENT: normal Neck: supple. no JVD. Carotids 2+ bilat; no bruits. No lymphadenopathy or thryomegaly appreciated. Cor: PMI nondisplaced. Regular rate & rhythm. No rubs, gallops or murmurs. Lungs: clear Abdomen: soft, nontender, nondistended. No hepatosplenomegaly. No bruits or masses. Good bowel sounds. Extremities: no cyanosis, clubbing, rash, edema Neuro: alert & orientedx3, dense L-sided plegia  BP currently high. Will stop IVF. And wean NE off slowly, (d/w bedside RN)   Restart GDMT as tolerated  Suspect cardioembolic event. Would repeat echo. Would favor full anticoagulation with DOAC.   CRITICAL CARE Performed by: Jules Oar  Total critical care time: 38 minutes  Critical care time was exclusive of separately billable procedures and treating other patients.  Critical care was necessary to treat or prevent imminent or  life-threatening deterioration.  Critical care was time spent personally by me (independent of midlevel providers or residents) on the following activities: development of treatment plan with patient and/or surrogate as well as nursing, discussions with consultants, evaluation of patient's response to treatment, examination of patient, obtaining history from patient or surrogate, ordering and performing treatments and interventions, ordering and review of laboratory studies, ordering and review of radiographic studies, pulse oximetry and re-evaluation of patient's condition.  Jules Oar, MD  5:49 PM

## 2023-12-03 ENCOUNTER — Other Ambulatory Visit (HOSPITAL_COMMUNITY): Payer: Medicare (Managed Care)

## 2023-12-03 DIAGNOSIS — I63511 Cerebral infarction due to unspecified occlusion or stenosis of right middle cerebral artery: Secondary | ICD-10-CM | POA: Diagnosis not present

## 2023-12-03 DIAGNOSIS — R29712 NIHSS score 12: Secondary | ICD-10-CM

## 2023-12-03 DIAGNOSIS — I63311 Cerebral infarction due to thrombosis of right middle cerebral artery: Secondary | ICD-10-CM | POA: Diagnosis not present

## 2023-12-03 DIAGNOSIS — Z95811 Presence of heart assist device: Secondary | ICD-10-CM | POA: Diagnosis not present

## 2023-12-03 DIAGNOSIS — B20 Human immunodeficiency virus [HIV] disease: Secondary | ICD-10-CM | POA: Diagnosis not present

## 2023-12-03 DIAGNOSIS — I472 Ventricular tachycardia, unspecified: Secondary | ICD-10-CM

## 2023-12-03 DIAGNOSIS — N1832 Chronic kidney disease, stage 3b: Secondary | ICD-10-CM | POA: Diagnosis not present

## 2023-12-03 DIAGNOSIS — I639 Cerebral infarction, unspecified: Secondary | ICD-10-CM

## 2023-12-03 DIAGNOSIS — I255 Ischemic cardiomyopathy: Secondary | ICD-10-CM | POA: Diagnosis not present

## 2023-12-03 LAB — CBC
HCT: 38.8 % — ABNORMAL LOW (ref 39.0–52.0)
Hemoglobin: 13.1 g/dL (ref 13.0–17.0)
MCH: 27.6 pg (ref 26.0–34.0)
MCHC: 33.8 g/dL (ref 30.0–36.0)
MCV: 81.7 fL (ref 80.0–100.0)
Platelets: 121 10*3/uL — ABNORMAL LOW (ref 150–400)
RBC: 4.75 MIL/uL (ref 4.22–5.81)
RDW: 15.3 % (ref 11.5–15.5)
WBC: 6 10*3/uL (ref 4.0–10.5)
nRBC: 0 % (ref 0.0–0.2)

## 2023-12-03 LAB — URINALYSIS, COMPLETE (UACMP) WITH MICROSCOPIC
Bacteria, UA: NONE SEEN
Bilirubin Urine: NEGATIVE
Glucose, UA: >=500 mg/dL — AB
Hgb urine dipstick: NEGATIVE
Ketones, ur: NEGATIVE mg/dL
Leukocytes,Ua: NEGATIVE
Nitrite: NEGATIVE
Protein, ur: NEGATIVE mg/dL
Specific Gravity, Urine: 1.011 (ref 1.005–1.030)
pH: 7 (ref 5.0–8.0)

## 2023-12-03 LAB — BASIC METABOLIC PANEL WITH GFR
Anion gap: 11 (ref 5–15)
BUN: 15 mg/dL (ref 8–23)
CO2: 25 mmol/L (ref 22–32)
Calcium: 9.4 mg/dL (ref 8.9–10.3)
Chloride: 103 mmol/L (ref 98–111)
Creatinine, Ser: 1.41 mg/dL — ABNORMAL HIGH (ref 0.61–1.24)
GFR, Estimated: 55 mL/min — ABNORMAL LOW (ref >60)
Glucose, Bld: 90 mg/dL (ref 70–99)
Potassium: 4.3 mmol/L (ref 3.5–5.1)
Sodium: 139 mmol/L (ref 135–145)

## 2023-12-03 LAB — RAPID URINE DRUG SCREEN, HOSP PERFORMED
Amphetamines: NOT DETECTED
Barbiturates: NOT DETECTED
Benzodiazepines: NOT DETECTED
Cocaine: NOT DETECTED
Opiates: NOT DETECTED
Tetrahydrocannabinol: NOT DETECTED

## 2023-12-03 MED ORDER — DIPHENHYDRAMINE HCL 25 MG PO CAPS
25.0000 mg | ORAL_CAPSULE | Freq: Once | ORAL | Status: AC
  Administered 2023-12-03: 25 mg via ORAL
  Filled 2023-12-03: qty 1

## 2023-12-03 NOTE — Progress Notes (Signed)
 Inpatient Rehab Admissions Coordinator:   I met with Pt. And wife to discuss potential CIR admit. They are interested and wife states she can take FMLA or get [facility] caregivers to stay with Pt. At d/c. They prefer to use Pt.'s [facility] benefits, so I instructed Pt.'s wife to call [facility] and notify them of Pt.'s hospital admission. I also sent notification via online reporting form. I will follow for potential admit pending medical readiness and payor approval.  Wandalee Gust, MS, CCC-SLP Rehab Admissions Coordinator  [phone] (celll) [phone] (office)

## 2023-12-03 NOTE — Progress Notes (Signed)
 Richard [name], MRN:  [id number], DOB:  12-12-1955, LOS: 2 ADMISSION DATE:  12/01/2023, CONSULTATION DATE:  12/01/2023 REFERRING MD:  Doretta Gant  CHIEF COMPLAINT:  hypotension   History of Present Illness:  68 year old male with past medical history of CAD s/p CABG x3 2015, ischemic cardiomyopathy s/p ICD placement, hypertension, hyperlipidemia, HIV, GERD, stroke in 2008 without residual who presents to the ED with slurred speech, left-sided facial droop and left-sided weakness.  Last known well 11/30/2023 at 1800.  Wife was here at the hospital seeing another family member when she called home this morning and heard patient's slurred speech.  Initial CT head showed subtle hypoattenuation involving the right caudate and right lentiform nuclei, ASPECTS 8.  CT angiography showed occlusion of the right M1 segment, proximal M2 branch of the right MCA with intraluminal thrombus.  Went to IR and had 6 passes into the right M2 segment with modest improvement.  Posttreatment TICI score 2A.  Labs in ED with platelet 122, creatinine 1.33, ethanol negative.  Patient was admitted to ICU under stroke team.  Post NIR, patient required phenylephrine  to maintain BP parameter of SBP 130-180 per Dr. Laverta Potters.  Meds were changed to norepinephrine .  CCM was consulted in the setting of vasopressor use.  Pertinent  Medical History  CAD s/p CABG times 10/26/2013, ischemic cardiomyopathy s/p ICD placement, hypertension, hyperlipidemia, HIV, GERD, stroke in 2008 without residual  Significant Hospital Events: Including procedures, antibiotic start and stop dates in addition to other pertinent events   4/24: Right M1 stroke s/p NIR, now on Levophed  4/25 mains on low-dose levo for SBP goal 130-180, consistent left-sided weakness with slight facial droop 4/26 no issues overnight, now off vasopressor support  Interim History / Subjective:  Seen sitting up in bed with no acute complaints, eating breakfast  Objective   Blood  pressure 110/76, pulse 70, temperature 98 F (36.7 C), temperature source Axillary, resp. rate 14, height 5\' 10"  (1.778 m), weight 78.6 kg, SpO2 97%.        Intake/Output Summary (Last 24 hours) at 12/03/2023 0732 Last data filed at 12/03/2023 0400 Gross per 24 hour  Intake 477.51 ml  Output 1950 ml  Net -1472.49 ml   Filed Weights   12/01/23 1000  Weight: 78.6 kg    Examination: General: Acute on chronic ill-appearing middle-aged male sitting up in bed in no acute distress HEENT: [place]/AT, MM pink/moist, PERRL,  Neuro: Alert and oriented x 3, left sided weakness, slight facial droop CV: s1s2 regular rate and rhythm, no murmur, rubs, or gallops,  PULM: Clear to auscultation bilaterally, no increased work of breathing, no added breath sounds, on room air GI: soft, bowel sounds active in all 4 quadrants, non-tender, non-distended, tolerating oral diet Extremities: warm/dry, no edema  Skin: no rashes or lesions  Resolved Hospital Problem list    Assessment & Plan:  Acute right M1 stroke s/p right MCA and M2 thrombectomy -Presented with left facial droop and left weakness, slurred speech.  CT with right M1 and proximal M2 thrombus s/p NIR.  Had 6 passes into the right M2 segment with modest improvement. P: Management per neurology Neuroprotective measures Secondary stroke prevention PT/OT/SLP  CAD s/p CABG x 3 2015 Ischemic cardiomyopathy s/p ICD placement Hypertension Hyperlipidemia -Most recent echo 02/2023 with LVEF 30 to 35%, regional wall motion abnormalities, mildly dilated LV, G1 DD, RV mildly reduced, LA moderately dilated trivial AR and TR. Repeat ECHO  with further reduced EF of 20 to 25% P:  Heart failure now following Optimize electrolytes Continuous telemetry Continue statin GDMT as able  CKD IIIb - Baseline creatinine appears to range between 1.3-1.5 GFR upper 40s to low 50s P: Follow renal function Monitor urine output Trend Bement Avoid  nephrotoxins Ensure adequate renal perfusion  HIV -Viral load undetectable 09/2023 P: Continue Dovato   GERD P: Continue Pepcid   PCCM will sign off. Thank you for the opportunity to participate in this patient's care. Please contact if we can be of further assistance.    Best Practice (right click and "Reselect all SmartList Selections" daily)   Diet/type: clear liquids ADAT DVT prophylaxis: LMWH Pressure ulcer(s): na GI prophylaxis: H2B Lines: N/A Foley:  N/A Code Status:  full code Last date of multidisciplinary goals of care discussion [per primary]   Critical care time: NA   Alacia Rehmann D. Harris, NP-C [facility] Personal contact information can be found on Amion  If no contact or response made please call [phone] 12/03/2023, 7:32 AM

## 2023-12-03 NOTE — Progress Notes (Addendum)
 STROKE TEAM PROGRESS NOTE    SIGNIFICANT HOSPITAL EVENTS 4/24: Patient admitted with right MCA occlusion, mechanical thrombectomy performed with TICI 2a revascularization achieved 4/25: MRI reveals moderate size acute infarct in the right MCA distribution.  Chronic occlusion of the left ICA. TTE shows EF 20-25%, HF consulted and recommends repeat echo, which is ordered.   INTERIM HISTORY/SUBJECTIVE  Remains off pressors. PT recommending rehab placement.   On exam, left hemianopia, left facial droop, flaccid left side, left neglect. Wife at bedside. Plan of care discussed.   Pending repeat echo.  Currently on DAPT, consider DOAC per HF. Will discuss pending repeat echo results.   Transfer out of ICU.    OBJECTIVE  CBC    Component Value Date/Time   WBC 6.0 12/03/2023 0703   RBC 4.75 12/03/2023 0703   HGB 13.1 12/03/2023 0703   HCT 38.8 (L) 12/03/2023 0703   PLT 121 (L) 12/03/2023 0703   MCV 81.7 12/03/2023 0703   MCH 27.6 12/03/2023 0703   MCHC 33.8 12/03/2023 0703   RDW 15.3 12/03/2023 0703   LYMPHSABS 1.0 12/01/2023 1029   MONOABS 0.4 12/01/2023 1029   EOSABS 0.1 12/01/2023 1029   BASOSABS 0.0 12/01/2023 1029    BMET    Component Value Date/Time   NA 139 12/03/2023 0703   NA 139 02/03/2023 0950   K 4.3 12/03/2023 0703   CL 103 12/03/2023 0703   CO2 25 12/03/2023 0703   GLUCOSE 90 12/03/2023 0703   BUN 15 12/03/2023 0703   BUN 11 02/03/2023 0950   CREATININE 1.41 (H) 12/03/2023 0703   CREATININE 1.38 (H) 10/01/2016 1547   CALCIUM  9.4 12/03/2023 0703   EGFR 51 (L) 02/03/2023 0950   GFRNONAA 55 (L) 12/03/2023 0703    IMAGING past 24 hours ECHOCARDIOGRAM COMPLETE BUBBLE STUDY Result Date: 12/02/2023    ECHOCARDIOGRAM REPORT   Patient Name:   Richard [name] Date of Exam: 12/02/2023 Medical Rec #:  [id number]           Height:       70.0 in Accession #:    [id number]          Weight:       173.3 lb Date of Birth:  1956-03-06           BSA:          1.964 m  Patient Age:    68 years            BP:           107/69 mmHg Patient Gender: M                   HR:           85 bpm. Exam Location:  Inpatient Procedure: 2D Echo, Cardiac Doppler and Color Doppler (Both Spectral and Color            Flow Doppler were utilized during procedure). Indications:    Stroke  History:        Patient has prior history of Echocardiogram examinations, most                 recent 03/03/2023. Cardiomyopathy and CHF, CAD, Abnormal ECG and                 Defibrillator, Stroke, Arrythmias:VT, Signs/Symptoms:Altered                 Mental Status; Risk Factors:Hypertension and Dyslipidemia.  Sonographer:    Brian Campanile  [facility] RDCS Referring Phys: [id number] Genevieve Kerry [facility] IMPRESSIONS  1. Left ventricular ejection fraction, by estimation, is 20 to 25%. The left ventricle has severely decreased function. The left ventricle demonstrates global hypokinesis. The left ventricular internal cavity size was mildly dilated. There is mild concentric left ventricular hypertrophy. Left ventricular diastolic parameters are consistent with Grade I diastolic dysfunction (impaired relaxation).  2. Right ventricular systolic function is moderately reduced. The right ventricular size is mildly enlarged. The estimated right ventricular systolic pressure is 16.5 mmHg.  3. Left atrial size was mildly dilated.  4. The mitral valve is normal in structure. Moderate mitral valve regurgitation. No evidence of mitral stenosis.  5. The aortic valve is tricuspid. There is mild calcification of the aortic valve. Aortic valve regurgitation is not visualized. No aortic stenosis is present.  6. The inferior vena cava is normal in size with greater than 50% respiratory variability, suggesting right atrial pressure of 3 mmHg.  7. Agitated saline contrast bubble study was negative, with no evidence of any interatrial shunt. FINDINGS  Left Ventricle: Left ventricular ejection fraction, by estimation, is 20 to 25%. The left ventricle has  severely decreased function. The left ventricle demonstrates global hypokinesis. The left ventricular internal cavity size was mildly dilated. There is mild concentric left ventricular hypertrophy. Left ventricular diastolic parameters are consistent with Grade I diastolic dysfunction (impaired relaxation). Right Ventricle: The right ventricular size is mildly enlarged. No increase in right ventricular wall thickness. Right ventricular systolic function is moderately reduced. The tricuspid regurgitant velocity is 1.84 m/s, and with an assumed right atrial pressure of 3 mmHg, the estimated right ventricular systolic pressure is 16.5 mmHg. Left Atrium: Left atrial size was mildly dilated. Right Atrium: Right atrial size was normal in size. Pericardium: There is no evidence of pericardial effusion. Mitral Valve: The mitral valve is normal in structure. Moderate mitral valve regurgitation. No evidence of mitral valve stenosis. Tricuspid Valve: The tricuspid valve is normal in structure. Tricuspid valve regurgitation is trivial. Aortic Valve: The aortic valve is tricuspid. There is mild calcification of the aortic valve. Aortic valve regurgitation is not visualized. No aortic stenosis is present. Pulmonic Valve: The pulmonic valve was normal in structure. Pulmonic valve regurgitation is trivial. Aorta: The aortic root is normal in size and structure. Venous: The inferior vena cava is normal in size with greater than 50% respiratory variability, suggesting right atrial pressure of 3 mmHg. IAS/Shunts: No atrial level shunt detected by color flow Doppler. Agitated saline contrast bubble study was negative, with no evidence of any interatrial shunt. Additional Comments: A device lead is visualized in the right ventricle.  LEFT VENTRICLE PLAX 2D LVIDd:         6.20 cm      Diastology LVIDs:         5.70 cm      LV e' medial:    4.24 cm/s LV PW:         1.40 cm      LV E/e' medial:  11.1 LV IVS:        1.20 cm      LV e'  lateral:   4.90 cm/s LVOT diam:     2.20 cm      LV E/e' lateral: 9.6 LV SV:         66 LV SV Index:   33 LVOT Area:     3.80 cm  LV Volumes (MOD) LV vol d, MOD A2C: 187.0 ml LV vol d, MOD A4C:  177.0 ml LV vol s, MOD A2C: 115.0 ml LV vol s, MOD A4C: 131.0 ml LV SV MOD A2C:     72.0 ml LV SV MOD A4C:     177.0 ml LV SV MOD BP:      57.6 ml RIGHT VENTRICLE            IVC RV S prime:     6.96 cm/s  IVC diam: 1.10 cm TAPSE (M-mode): 1.2 cm LEFT ATRIUM           Index        RIGHT ATRIUM           Index LA diam:      2.20 cm 1.12 cm/m   RA Area:     10.90 cm LA Vol (A2C): 25.8 ml 13.14 ml/m  RA Volume:   23.40 ml  11.92 ml/m LA Vol (A4C): 37.6 ml 19.15 ml/m  AORTIC VALVE LVOT Vmax:   101.00 cm/s LVOT Vmean:  63.900 cm/s LVOT VTI:    0.173 m  AORTA Ao Root diam: 3.70 cm MITRAL VALVE                TRICUSPID VALVE MV Area (PHT): 3.99 cm     TR Peak grad:   13.5 mmHg MV Decel Time: 190 msec     TR Vmax:        184.00 cm/s MV E velocity: 47.20 cm/s MV A velocity: 111.00 cm/s  SHUNTS MV E/A ratio:  0.43         Systemic VTI:  0.17 m                             Systemic Diam: 2.20 cm Dalton McleanMD Electronically signed by Archer Bear Signature Date/Time: 12/02/2023/2:08:55 PM    Final    MR BRAIN WO CONTRAST Result Date: 12/02/2023 CLINICAL DATA:  Stroke workup EXAM: MRI HEAD WITHOUT CONTRAST TECHNIQUE: Multiplanar, multiecho pulse sequences of the brain and surrounding structures were obtained without intravenous contrast. COMPARISON:  Head CT and CTA from yesterday FINDINGS: Brain: Patchy acute infarction in the right MCA distribution along the temporal cortex/white matter, posterior insula (which is thin and may have been previously affected by infarct), and posterior striatum/corona radiata. New acute hemorrhage, hydrocephalus, mass, or collection. Vascular: Absent right MCA flow void at the level of the lower insula, noted angiographic findings. Absent left ICA flow void with intracranial normalization.  Skull and upper cervical spine: Normal marrow signal Sinuses/Orbits: Negative IMPRESSION: Moderate acute infarction in the right MCA distribution. Slow or absent flow in the right MCA beginning at the lower sylvian fissure. 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 Electronically Signed   By: Ronnette Coke M.D.   On: 12/02/2023 11:26    Vitals:   12/03/23 0500 12/03/23 0600 12/03/23 0700 12/03/23 0800  BP: 109/73 110/76 (!) 122/102 109/82  Pulse: 64 70 70 60  Resp: 10 14 19 16   Temp:      TempSrc:      SpO2: 95% 97% 95% 97%  Weight:      Height:         PHYSICAL EXAM General:  Alert, well-nourished, well-developed patient in no acute distress Psych:  Mood and affect appropriate for situation CV: Regular rate and rhythm on monitor Respiratory:  Regular, unlabored respirations on supplemental O2   NEURO:  Mental Status: AA&Ox3, patient is able to give some history Speech/Language: Mild dysarthria,  no aphasia  Cranial Nerves:  II: PERRL. Left hemianopia.  III, IV, VI: EOMI. Eyelids elevate symmetrically.  V: Sensation is intact to light touch and symmetrical to face.  VII: Left facial droop VIII: hearing intact to voice. IX, X: Voice is mildly dysarthric, no hoarseness.  XII: tongue is midline without fasciculations. Motor:  RUE/RLE: 5/5, no drift LUE: 0/5, no movement, no grip strength LLE:0/5, no movement, unable to wiggle toes.  Tone: is normal and bulk is normal Sensation- Intact to light touch bilaterally. Extinction to DSS present on the left Coordination: FTN intact on the right Gait- deferred  Most Recent NIH  TOTAL: 12   ASSESSMENT/PLAN  Mr. ADONIAH BLUMENSCHEIN is a 68 y.o. male with history of stroke, HIV infection, CAD, MI, ischemic cardiomyopathy, CHF, ICD, V. tach and hyperlipidemia admitted for acute onset dysarthria, left-sided weakness and left facial droop.  He was found to have right MCA occlusion and was taken to IR for  mechanical thrombectomy with  TICI 2a revascularization achieved.  MRI reveals acute infarct in the right MCA distribution.  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 Cardioembolic from severe cardiomyopathy with low EF. Code Stroke CT head hypoattenuation involving right caudate and right lentiform nuclei possibly reflecting acute infarct, asymmetric density of right M1 segment ASPECTS 8 CTA head & neck occlusion of right M1 segment as well as proximal M2 branch of the right MCA with intraluminal thrombus noted.  Chronic left ICA occlusion, reconstitution of the supraclinoid left ICA as well as the left ACA and left MCA near the circle of Willis. CT perfusion 0 mL core and 143 mL penumbra in right MCA territory MRI moderate acute infarct in right MCA distribution 2D Echo EF 20 to 25% LDL 37 HgbA1c 5.6 VTE prophylaxis - Lovenox  aspirin  81 mg daily prior to admission, now on aspirin  81 mg daily and clopidogrel  75 mg daily. Cardiology recommend [facility], will consider DOAC in 3-5 days given stroke size.  Therapy recommendations:  CIR Disposition: Pending  Intracranial and extracranial stenosis/occlusion CTA head & neck occlusion of right M1 segment as well as proximal M2 branch of the right MCA with intraluminal thrombus noted.   CTA also showed Chronic left ICA occlusion, reconstitution of the supraclinoid left ICA as well as the left ACA and left MCA near the circle of Willis. Per Dr. Laverta Potters IR note - Acute on chronic occlusive disease, right M1 and M2, with stroke  On ASA and plavix  now  CHF s/p ICD Severe CAD/MI Patient has history of CHF status post ICD implantation (2015) He has a single-chamber defibrillator which would not be reliable to detect atrial fibrillation.  Echo EF 20 to 25% Resume GDMT as BP tolerates Recommend AC given stroke will consider DOAC in 3-5 days  given stroke size.  History of hypertension now hypotensive Home meds: Metoprolol  XL 12.5 mg daily Stable on the low end off norepinephrine  Long term BP goal normotensive  Hyperlipidemia Home meds: Atorvastatin  40 mg daily and ezetimibe  10 mg daily LDL 37, goal < 70 Home lipitor and Zetia  resumed Continue statin and Zetia  at discharge  Dysphagia Patient has post-stroke dysphagia SLP consulted On dysphagia 3 and thin liquid Advance diet as tolerated  Other stroke risk factors Advanced age Patient has a history of a stroke in 2008 with no residual deficits  Other Active Problems HIV  infection - continue home Dovato   Hospital day # 2  Pt seen by Neuro NP/APP and later by MD. Note/plan to be edited by MD as needed.    Audrene Lease, DNP, AGACNP-BC [facility] Please use AMION for contact information & EPIC for messaging.  ATTENDING NOTE: I reviewed above note and agree with the assessment and plan. Pt was seen and examined.   Wife at the bedside. Pt is awake, alert, eyes open, orientated to age, place, time and people. No aphasia, paucity of speech with mild dysarthria, following all simple commands. Able to name and repeat. No gaze palsy, tracking bilaterally, visual field exam showed left hemianopia, PERRL. Left facial droop. Tongue protrusion to the left. RUE and RLE 5/5, LUE and LLE flaccid. Sensation neglect on the left, right FTN intact, gait not tested.  Patient stroke could be from acute on chronic MCA occlusive disease versus cardioembolic due to low EF.  Cardiology on board, will consider GDMT once BP tolerates.  Not on DAPT, will consider DOAC in 3 to 5 days given stroke size.  Continue statin and Zetia .  Patient has single-chamber ICD, not adequate for AF detection.  PT and OT recommend CIR.  For detailed assessment and plan, please refer to above as I have made changes wherever appropriate.   Consuelo Denmark, MD PhD Stroke Neurology 12/03/2023 5:57  PM  This patient is critically ill due to right MCA stroke, severe intracranial and extracranial vascular disease, cardiomyopathy with low EF and at significant risk of neurological worsening, death form recurrent stroke, hemorrhagic transmission, heart failure. This patient's care requires constant monitoring of vital signs, hemodynamics, respiratory and cardiac monitoring, review of multiple databases, neurological assessment, discussion with family, other specialists and medical decision making of high complexity. I spent 40 minutes of neurocritical care time in the care of this patient. I had long discussion with patient and wife at bedside, updated pt current condition, treatment plan and potential prognosis, and answered all the questions.  They expressed understanding and appreciation.

## 2023-12-03 NOTE — Progress Notes (Signed)
   Patient Name: Richard [name] Date of Encounter: 12/03/2023 [facility] Cardiologist: Magnus Schuller, MD   Interval Summary  .    Dense left-sided hemiparesis, but he is able to speak fluently and has not had difficulty swallowing liquids. Cardiovascular complaints.  Denies shortness of breath, able to lie fully supine.  Vital Signs .    Vitals:   12/03/23 1400 12/03/23 1500 12/03/23 1600 12/03/23 1706  BP: 104/76 (!) 90/58 112/76 108/73  Pulse: 84 (!) 58 70 83  Resp: (!) 23 (!) 21 10 20   Temp:   98.6 F (37 C) 98.6 F (37 C)  TempSrc:   Axillary Oral  SpO2: 93% (!) 88% 92% 99%  Weight:      Height:        Intake/Output Summary (Last 24 hours) at 12/03/2023 1712 Last data filed at 12/03/2023 0700 Gross per 24 hour  Intake 276.16 ml  Output 1950 ml  Net -1673.84 ml      12/01/2023   10:00 AM 03/08/2023    9:42 AM 03/07/2023    2:23 PM  Last 3 Weights  Weight (lbs) 173 lb 4.5 oz 189 lb 12.8 oz 184 lb 6.4 oz  Weight (kg) 78.6 kg 86.093 kg 83.643 kg      Telemetry/ECG    Sinus rhythm with frequent PVCs and relatively frequent 3-4 beat runs of nonsustained VT.- Personally Reviewed  Physical Exam .   GEN: No acute distress.   Neck: No JVD Cardiac: Occasional ectopy on a background of RRR, no murmurs, rubs, or gallops.  Healthy ICD site Respiratory: Clear to auscultation bilaterally. GI: Soft, nontender, non-distended  MS: No edema Dense left-sided hemiparesis involving both upper and lower extremity  Assessment & Plan .     Appears clinically euvolemic.  No evidence of heart failure exacerbation.  No longer hypotensive, off pressors. It remains premature to start heart failure medications with his current blood pressure and recent large stroke. He has a longstanding history of nonsustained ventricular tachycardia and required defibrillator intervention once in 2023 (ATP only no shocks). Very high likelihood that he had a cardioembolic event.  He has a  single-chamber defibrillator which would not be reliable to detect atrial fibrillation.  It is also quite possible that he had a left ventricular thrombus in the setting of severely depressed left ventricular systolic function, even though no clear evidence of intracardiac thrombus is seen on yesterday's echocardiogram. Once safe from a neurological point of view, he should start oral anticoagulation.  For questions or updates, please contact [facility] Please consult [email or web address] for contact info under        Signed, Luana Rumple, MD

## 2023-12-04 ENCOUNTER — Other Ambulatory Visit (HOSPITAL_COMMUNITY): Payer: Medicare (Managed Care)

## 2023-12-04 DIAGNOSIS — I63511 Cerebral infarction due to unspecified occlusion or stenosis of right middle cerebral artery: Secondary | ICD-10-CM | POA: Diagnosis not present

## 2023-12-04 DIAGNOSIS — R29712 NIHSS score 12: Secondary | ICD-10-CM | POA: Diagnosis not present

## 2023-12-04 LAB — BASIC METABOLIC PANEL WITH GFR
Anion gap: 11 (ref 5–15)
BUN: 12 mg/dL (ref 8–23)
CO2: 23 mmol/L (ref 22–32)
Calcium: 9 mg/dL (ref 8.9–10.3)
Chloride: 104 mmol/L (ref 98–111)
Creatinine, Ser: 1.4 mg/dL — ABNORMAL HIGH (ref 0.61–1.24)
GFR, Estimated: 55 mL/min — ABNORMAL LOW (ref >60)
Glucose, Bld: 94 mg/dL (ref 70–99)
Potassium: 4.1 mmol/L (ref 3.5–5.1)
Sodium: 138 mmol/L (ref 135–145)

## 2023-12-04 LAB — CBC
HCT: 38.6 % — ABNORMAL LOW (ref 39.0–52.0)
Hemoglobin: 13.2 g/dL (ref 13.0–17.0)
MCH: 27.7 pg (ref 26.0–34.0)
MCHC: 34.2 g/dL (ref 30.0–36.0)
MCV: 81.1 fL (ref 80.0–100.0)
Platelets: 116 10*3/uL — ABNORMAL LOW (ref 150–400)
RBC: 4.76 MIL/uL (ref 4.22–5.81)
RDW: 15 % (ref 11.5–15.5)
WBC: 5.6 10*3/uL (ref 4.0–10.5)
nRBC: 0 % (ref 0.0–0.2)

## 2023-12-04 MED ORDER — DIPHENHYDRAMINE HCL 50 MG/ML IJ SOLN
25.0000 mg | Freq: Once | INTRAMUSCULAR | Status: AC
  Administered 2023-12-04: 25 mg via INTRAVENOUS
  Filled 2023-12-04: qty 1

## 2023-12-04 MED ORDER — POLYETHYLENE GLYCOL 3350 17 G PO PACK
17.0000 g | PACK | Freq: Every day | ORAL | Status: DC
  Administered 2023-12-04 – 2023-12-06 (×3): 17 g via ORAL
  Filled 2023-12-04 (×3): qty 1

## 2023-12-04 NOTE — Plan of Care (Signed)
  Problem: Education: Goal: Knowledge of disease or condition will improve Outcome: Progressing Goal: Knowledge of secondary prevention will improve (MUST DOCUMENT ALL) Outcome: Progressing Goal: Knowledge of patient specific risk factors will improve (DELETE if not current risk factor) Outcome: Progressing   Problem: Ischemic Stroke/TIA Tissue Perfusion: Goal: Complications of ischemic stroke/TIA will be minimized Outcome: Progressing   Problem: Coping: Goal: Will verbalize positive feelings about self Outcome: Progressing Goal: Will identify appropriate support needs Outcome: Progressing   Problem: Health Behavior/Discharge Planning: Goal: Ability to manage health-related needs will improve Outcome: Progressing Goal: Goals will be collaboratively established with patient/family Outcome: Progressing   Problem: Self-Care: Goal: Ability to participate in self-care as condition permits will improve Outcome: Progressing Goal: Verbalization of feelings and concerns over difficulty with self-care will improve Outcome: Progressing Goal: Ability to communicate needs accurately will improve Outcome: Progressing   Problem: Nutrition: Goal: Risk of aspiration will decrease Outcome: Progressing Goal: Dietary intake will improve Outcome: Progressing   Problem: Education: Goal: Understanding of CV disease, CV risk reduction, and recovery process will improve Outcome: Progressing Goal: Individualized Educational Video(s) Outcome: Progressing   Problem: Activity: Goal: Ability to return to baseline activity level will improve Outcome: Progressing   Problem: Cardiovascular: Goal: Ability to achieve and maintain adequate cardiovascular perfusion will improve Outcome: Progressing Goal: Vascular access site(s) Level 0-1 will be maintained Outcome: Progressing   Problem: Health Behavior/Discharge Planning: Goal: Ability to safely manage health-related needs after discharge will  improve Outcome: Progressing   Problem: Education: Goal: Knowledge of General Education information will improve Description: Including pain rating scale, medication(s)/side effects and non-pharmacologic comfort measures Outcome: Progressing   Problem: Health Behavior/Discharge Planning: Goal: Ability to manage health-related needs will improve Outcome: Progressing   Problem: Clinical Measurements: Goal: Ability to maintain clinical measurements within normal limits will improve Outcome: Progressing Goal: Will remain free from infection Outcome: Progressing Goal: Diagnostic test results will improve Outcome: Progressing Goal: Respiratory complications will improve Outcome: Progressing Goal: Cardiovascular complication will be avoided Outcome: Progressing   Problem: Activity: Goal: Risk for activity intolerance will decrease Outcome: Progressing   Problem: Nutrition: Goal: Adequate nutrition will be maintained Outcome: Progressing   Problem: Coping: Goal: Level of anxiety will decrease Outcome: Progressing   Problem: Elimination: Goal: Will not experience complications related to bowel motility Outcome: Progressing Goal: Will not experience complications related to urinary retention Outcome: Progressing   Problem: Pain Managment: Goal: General experience of comfort will improve and/or be controlled Outcome: Progressing   Problem: Safety: Goal: Ability to remain free from injury will improve Outcome: Progressing   Problem: Skin Integrity: Goal: Risk for impaired skin integrity will decrease Outcome: Progressing

## 2023-12-04 NOTE — Plan of Care (Signed)
  Problem: Education: Goal: Knowledge of disease or condition will improve Outcome: Progressing Goal: Knowledge of secondary prevention will improve (MUST DOCUMENT ALL) Outcome: Progressing Goal: Knowledge of patient specific risk factors will improve (DELETE if not current risk factor) Outcome: Progressing   Problem: Nutrition: Goal: Risk of aspiration will decrease Outcome: Progressing Goal: Dietary intake will improve Outcome: Progressing   Problem: Cardiovascular: Goal: Ability to achieve and maintain adequate cardiovascular perfusion will improve Outcome: Progressing Goal: Vascular access site(s) Level 0-1 will be maintained Outcome: Progressing   Problem: Elimination: Goal: Will not experience complications related to bowel motility Outcome: Progressing Goal: Will not experience complications related to urinary retention Outcome: Progressing   Problem: Pain Managment: Goal: General experience of comfort will improve and/or be controlled Outcome: Progressing   Problem: Safety: Goal: Ability to remain free from injury will improve Outcome: Progressing   Problem: Skin Integrity: Goal: Risk for impaired skin integrity will decrease Outcome: Progressing

## 2023-12-04 NOTE — Progress Notes (Addendum)
 STROKE TEAM PROGRESS NOTE    SIGNIFICANT HOSPITAL EVENTS 4/24: Patient admitted with right MCA occlusion, mechanical thrombectomy performed with TICI 2a revascularization achieved 4/25: MRI reveals moderate size acute infarct in the right MCA distribution.  Chronic occlusion of the left ICA. TTE shows EF 20-25%, HF consulted and recommends repeat echo, which is ordered.   INTERIM HISTORY/SUBJECTIVE  Stable neuro exam;left hemianopia, left facial droop, dysarthria, flaccid left side, left neglect.   Pending repeat echo.  Currently on DAPT, consider DOAC in 2-4 days due to stroke size.   Discharge planning for CIR. Wife updated over phone.    OBJECTIVE  CBC    Component Value Date/Time   WBC 5.6 12/04/2023 0742   RBC 4.76 12/04/2023 0742   HGB 13.2 12/04/2023 0742   HCT 38.6 (L) 12/04/2023 0742   PLT 116 (L) 12/04/2023 0742   MCV 81.1 12/04/2023 0742   MCH 27.7 12/04/2023 0742   MCHC 34.2 12/04/2023 0742   RDW 15.0 12/04/2023 0742   LYMPHSABS 1.0 12/01/2023 1029   MONOABS 0.4 12/01/2023 1029   EOSABS 0.1 12/01/2023 1029   BASOSABS 0.0 12/01/2023 1029    BMET    Component Value Date/Time   NA 138 12/04/2023 0742   NA 139 02/03/2023 0950   K 4.1 12/04/2023 0742   CL 104 12/04/2023 0742   CO2 23 12/04/2023 0742   GLUCOSE 94 12/04/2023 0742   BUN 12 12/04/2023 0742   BUN 11 02/03/2023 0950   CREATININE 1.40 (H) 12/04/2023 0742   CREATININE 1.38 (H) 10/01/2016 1547   CALCIUM  9.0 12/04/2023 0742   EGFR 51 (L) 02/03/2023 0950   GFRNONAA 55 (L) 12/04/2023 0742    IMAGING past 24 hours No results found.   Vitals:   12/03/23 2354 12/04/23 0442 12/04/23 0809 12/04/23 1129  BP: 112/75 118/81 118/65 109/73  Pulse: 73 73 81 81  Resp: 16 14 15 18   Temp: 98.6 F (37 C) 97.7 F (36.5 C) 98.6 F (37 C) 98.5 F (36.9 C)  TempSrc: Oral Oral Oral Oral  SpO2: 97% 92% 97% 99%  Weight:      Height:         PHYSICAL EXAM General:  Alert, well-nourished,  well-developed patient in no acute distress Psych:  Mood and affect appropriate for situation CV: Regular rate and rhythm on monitor Respiratory:  Regular, unlabored respirations on supplemental O2   NEURO:  Mental Status: AA&Ox3, patient is able to give some history Speech/Language: Mild dysarthria, no aphasia  Cranial Nerves:  II: PERRL. Left hemianopia.  III, IV, VI: EOMI. Eyelids elevate symmetrically.  V: Sensation is intact to light touch and symmetrical to face.  VII: Left facial droop VIII: hearing intact to voice. IX, X: Voice is mildly dysarthric, no hoarseness.  XII: tongue is midline without fasciculations. Motor:  RUE/RLE: 5/5, no drift LUE: 0/5, no movement, no grip strength LLE:0/5, no movement, unable to wiggle toes.  Tone: is normal and bulk is normal Sensation- Intact to light touch bilaterally. Extinction to DSS present on the left Coordination: FTN intact on the right Gait- deferred  Most Recent NIH  TOTAL: 14   ASSESSMENT/PLAN  [name] is a 68 y.o. male with history of stroke, HIV infection, CAD, MI, ischemic cardiomyopathy, CHF, ICD, V. tach and hyperlipidemia admitted for acute onset dysarthria, left-sided weakness and left facial droop.  He was found to have right MCA occlusion and was taken to IR for mechanical thrombectomy with  TICI 2a revascularization  achieved.  MRI reveals acute infarct in the right MCA distribution.  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 Cardioembolic from severe cardiomyopathy with low EF. Code Stroke CT head hypoattenuation involving right caudate and right lentiform nuclei possibly reflecting acute infarct, asymmetric density of right M1 segment ASPECTS 8 CTA head & neck occlusion of right M1 segment as well as proximal M2 branch of the right MCA with intraluminal thrombus noted.  Chronic  left ICA occlusion, reconstitution of the supraclinoid left ICA as well as the left ACA and left MCA near the circle of Willis. CT perfusion 0 mL core and 143 mL penumbra in right MCA territory MRI moderate acute infarct in right MCA distribution 2D Echo EF 20 to 25% LDL 37 HgbA1c 5.6 VTE prophylaxis - Lovenox  aspirin  81 mg daily prior to admission, now on aspirin  81 mg daily and clopidogrel  75 mg daily. Cardiology recommend [facility], will consider DOAC in 2-4 days given stroke size.  Therapy recommendations:  CIR Disposition: Pending  Intracranial and extracranial stenosis/occlusion CTA head & neck occlusion of right M1 segment as well as proximal M2 branch of the right MCA with intraluminal thrombus noted.   CTA also showed Chronic left ICA occlusion, reconstitution of the supraclinoid left ICA as well as the left ACA and left MCA near the circle of Willis. Per Dr. Laverta [name] IR note - Acute on chronic occlusive disease, right M1 and M2, with stroke  Continue ASA and plavix    CHF s/p ICD Severe CAD/MI Patient has history of CHF status post ICD implantation (2015) He has a single-chamber defibrillator which would not be reliable to detect atrial fibrillation.  Echo EF 20 to 25% Resume GDMT as BP tolerates Recommend AC given stroke will consider DOAC in 2-4 days given stroke size.  History of hypertension Now hypotensive Home meds: Metoprolol  XL 12.5 mg daily Stable on the low end off norepinephrine  Long term BP goal normotensive  Hyperlipidemia Home meds: Atorvastatin  40 mg daily and ezetimibe  10 mg daily LDL 37, goal < 70 Home lipitor and Zetia  resumed Continue statin and Zetia  at discharge  Dysphagia Patient has post-stroke dysphagia SLP consulted On dysphagia 3 and thin liquid Advance diet as tolerated  Other stroke risk factors Advanced age Patient has a history of a stroke in 2008 with no residual deficits  Other Active Problems HIV infection - continue home  Dovato   Hospital day # 3  Pt seen by Neuro NP/APP and later by MD. Note/plan to be edited by MD as needed.    Richard Lease, DNP, AGACNP-BC [facility] Please use AMION for contact information & EPIC for messaging.  ATTENDING NOTE: I reviewed above note and agree with the assessment and plan. Pt was seen and examined.   No family at bedside.  Patient sitting in bed for lunch, he still have left hemianopia, left facial droop, left hemiplegia.  No aphasia but still has mild to moderate dysarthria.  On DAPT, pending AC in 3 days.  Continue statin and Zetia .  BP stable but on the lower end, not able to start GDMT therapy at this time.  PT and OT recommend CIR.  For detailed assessment and plan, please refer to above as I have made changes wherever appropriate.   Consuelo Denmark, MD PhD Stroke Neurology 12/04/2023 5:13 PM

## 2023-12-04 NOTE — Plan of Care (Signed)
 Problem: Education: Goal: Knowledge of disease or condition will improve 12/04/2023 1214 by Keane Passe, RN Outcome: Progressing 12/04/2023 1214 by Keane Passe, RN Outcome: Progressing Goal: Knowledge of secondary prevention will improve (MUST DOCUMENT ALL) 12/04/2023 1214 by Keane Passe, RN Outcome: Progressing 12/04/2023 1214 by Keane Passe, RN Outcome: Progressing Goal: Knowledge of patient specific risk factors will improve (DELETE if not current risk factor) 12/04/2023 1214 by Keane Passe, RN Outcome: Progressing 12/04/2023 1214 by Keane Passe, RN Outcome: Progressing   Problem: Ischemic Stroke/TIA Tissue Perfusion: Goal: Complications of ischemic stroke/TIA will be minimized 12/04/2023 1214 by Keane Passe, RN Outcome: Progressing 12/04/2023 1214 by Keane Passe, RN Outcome: Progressing   Problem: Coping: Goal: Will verbalize positive feelings about self 12/04/2023 1214 by Keane Passe, RN Outcome: Progressing 12/04/2023 1214 by Keane Passe, RN Outcome: Progressing Goal: Will identify appropriate support needs 12/04/2023 1214 by Keane Passe, RN Outcome: Progressing 12/04/2023 1214 by Keane Passe, RN Outcome: Progressing   Problem: Health Behavior/Discharge Planning: Goal: Ability to manage health-related needs will improve 12/04/2023 1214 by Keane Passe, RN Outcome: Progressing 12/04/2023 1214 by Keane Passe, RN Outcome: Progressing Goal: Goals will be collaboratively established with patient/family 12/04/2023 1214 by Keane Passe, RN Outcome: Progressing 12/04/2023 1214 by Keane Passe, RN Outcome: Progressing   Problem: Self-Care: Goal: Ability to participate in self-care as condition permits will improve 12/04/2023 1214 by Keane Passe, RN Outcome: Progressing 12/04/2023 1214 by Keane Passe, RN Outcome: Progressing Goal: Verbalization of feelings and concerns over  difficulty with self-care will improve 12/04/2023 1214 by Keane Passe, RN Outcome: Progressing 12/04/2023 1214 by Keane Passe, RN Outcome: Progressing Goal: Ability to communicate needs accurately will improve 12/04/2023 1214 by Keane Passe, RN Outcome: Progressing 12/04/2023 1214 by Keane Passe, RN Outcome: Progressing   Problem: Nutrition: Goal: Risk of aspiration will decrease 12/04/2023 1214 by Keane Passe, RN Outcome: Progressing 12/04/2023 1214 by Keane Passe, RN Outcome: Progressing Goal: Dietary intake will improve 12/04/2023 1214 by Keane Passe, RN Outcome: Progressing 12/04/2023 1214 by Keane Passe, RN Outcome: Progressing   Problem: Education: Goal: Understanding of CV disease, CV risk reduction, and recovery process will improve 12/04/2023 1214 by Keane Passe, RN Outcome: Progressing 12/04/2023 1214 by Keane Passe, RN Outcome: Progressing Goal: Individualized Educational Video(s) 12/04/2023 1214 by Keane Passe, RN Outcome: Progressing 12/04/2023 1214 by Keane Passe, RN Outcome: Progressing   Problem: Activity: Goal: Ability to return to baseline activity level will improve 12/04/2023 1214 by Keane Passe, RN Outcome: Progressing 12/04/2023 1214 by Keane Passe, RN Outcome: Progressing   Problem: Cardiovascular: Goal: Ability to achieve and maintain adequate cardiovascular perfusion will improve 12/04/2023 1214 by Keane Passe, RN Outcome: Progressing 12/04/2023 1214 by Keane Passe, RN Outcome: Progressing Goal: Vascular access site(s) Level 0-1 will be maintained 12/04/2023 1214 by Keane Passe, RN Outcome: Progressing 12/04/2023 1214 by Keane Passe, RN Outcome: Progressing   Problem: Health Behavior/Discharge Planning: Goal: Ability to safely manage health-related needs after discharge will improve 12/04/2023 1214 by Keane Passe, RN Outcome:  Progressing 12/04/2023 1214 by Keane Passe, RN Outcome: Progressing   Problem: Education: Goal: Knowledge of General Education information will improve Description: Including pain rating scale, medication(s)/side effects and non-pharmacologic comfort measures 12/04/2023 1214 by Keane Passe, RN Outcome: Progressing 12/04/2023 1214 by Keane Passe, RN Outcome: Progressing  Problem: Health Behavior/Discharge Planning: Goal: Ability to manage health-related needs will improve 12/04/2023 1214 by Keane Passe, RN Outcome: Progressing 12/04/2023 1214 by Keane Passe, RN Outcome: Progressing   Problem: Clinical Measurements: Goal: Ability to maintain clinical measurements within normal limits will improve 12/04/2023 1214 by Keane Passe, RN Outcome: Progressing 12/04/2023 1214 by Keane Passe, RN Outcome: Progressing Goal: Will remain free from infection 12/04/2023 1214 by Keane Passe, RN Outcome: Progressing 12/04/2023 1214 by Keane Passe, RN Outcome: Progressing Goal: Diagnostic test results will improve 12/04/2023 1214 by Keane Passe, RN Outcome: Progressing 12/04/2023 1214 by Keane Passe, RN Outcome: Progressing Goal: Respiratory complications will improve 12/04/2023 1214 by Keane Passe, RN Outcome: Progressing 12/04/2023 1214 by Keane Passe, RN Outcome: Progressing Goal: Cardiovascular complication will be avoided 12/04/2023 1214 by Keane Passe, RN Outcome: Progressing 12/04/2023 1214 by Keane Passe, RN Outcome: Progressing   Problem: Activity: Goal: Risk for activity intolerance will decrease 12/04/2023 1214 by Keane Passe, RN Outcome: Progressing 12/04/2023 1214 by Keane Passe, RN Outcome: Progressing   Problem: Nutrition: Goal: Adequate nutrition will be maintained 12/04/2023 1214 by Keane Passe, RN Outcome: Progressing 12/04/2023 1214 by Keane Passe, RN Outcome:  Progressing   Problem: Coping: Goal: Level of anxiety will decrease 12/04/2023 1214 by Keane Passe, RN Outcome: Progressing 12/04/2023 1214 by Keane Passe, RN Outcome: Progressing   Problem: Elimination: Goal: Will not experience complications related to bowel motility 12/04/2023 1214 by Keane Passe, RN Outcome: Progressing 12/04/2023 1214 by Keane Passe, RN Outcome: Progressing Goal: Will not experience complications related to urinary retention 12/04/2023 1214 by Keane Passe, RN Outcome: Progressing 12/04/2023 1214 by Keane Passe, RN Outcome: Progressing   Problem: Pain Managment: Goal: General experience of comfort will improve and/or be controlled 12/04/2023 1214 by Keane Passe, RN Outcome: Progressing 12/04/2023 1214 by Keane Passe, RN Outcome: Progressing   Problem: Safety: Goal: Ability to remain free from injury will improve 12/04/2023 1214 by Keane Passe, RN Outcome: Progressing 12/04/2023 1214 by Keane Passe, RN Outcome: Progressing   Problem: Skin Integrity: Goal: Risk for impaired skin integrity will decrease 12/04/2023 1214 by Keane Passe, RN Outcome: Progressing 12/04/2023 1214 by Keane Passe, RN Outcome: Progressing

## 2023-12-04 NOTE — Plan of Care (Signed)
  Problem: Education: Goal: Knowledge of disease or condition will improve 12/04/2023 0604 by Francena Infield, RN Outcome: Progressing 12/04/2023 0517 by Francena Infield, RN Outcome: Progressing Goal: Knowledge of secondary prevention will improve (MUST DOCUMENT ALL) 12/04/2023 0604 by Francena Infield, RN Outcome: Progressing 12/04/2023 0517 by Francena Infield, RN Outcome: Progressing Goal: Knowledge of patient specific risk factors will improve (DELETE if not current risk factor) 12/04/2023 0604 by Francena Infield, RN Outcome: Progressing 12/04/2023 0517 by Francena Infield, RN Outcome: Progressing   Problem: Ischemic Stroke/TIA Tissue Perfusion: Goal: Complications of ischemic stroke/TIA will be minimized 12/04/2023 0604 by Francena Infield, RN Outcome: Progressing 12/04/2023 0517 by Francena Infield, RN Outcome: Progressing   Problem: Cardiovascular: Goal: Ability to achieve and maintain adequate cardiovascular perfusion will improve 12/04/2023 0604 by Francena Infield, RN Outcome: Progressing 12/04/2023 0517 by Francena Infield, RN Outcome: Progressing Goal: Vascular access site(s) Level 0-1 will be maintained 12/04/2023 0604 by Francena Infield, RN Outcome: Progressing 12/04/2023 0517 by Francena Infield, RN Outcome: Progressing

## 2023-12-05 DIAGNOSIS — R29712 NIHSS score 12: Secondary | ICD-10-CM | POA: Diagnosis not present

## 2023-12-05 DIAGNOSIS — I63511 Cerebral infarction due to unspecified occlusion or stenosis of right middle cerebral artery: Secondary | ICD-10-CM | POA: Diagnosis not present

## 2023-12-05 LAB — BASIC METABOLIC PANEL WITH GFR
Anion gap: 11 (ref 5–15)
BUN: 12 mg/dL (ref 8–23)
CO2: 24 mmol/L (ref 22–32)
Calcium: 9.5 mg/dL (ref 8.9–10.3)
Chloride: 103 mmol/L (ref 98–111)
Creatinine, Ser: 1.44 mg/dL — ABNORMAL HIGH (ref 0.61–1.24)
GFR, Estimated: 54 mL/min — ABNORMAL LOW (ref >60)
Glucose, Bld: 87 mg/dL (ref 70–99)
Potassium: 4 mmol/L (ref 3.5–5.1)
Sodium: 138 mmol/L (ref 135–145)

## 2023-12-05 LAB — CBC
HCT: 41.1 % (ref 39.0–52.0)
Hemoglobin: 14 g/dL (ref 13.0–17.0)
MCH: 27.9 pg (ref 26.0–34.0)
MCHC: 34.1 g/dL (ref 30.0–36.0)
MCV: 81.9 fL (ref 80.0–100.0)
Platelets: 144 10*3/uL — ABNORMAL LOW (ref 150–400)
RBC: 5.02 MIL/uL (ref 4.22–5.81)
RDW: 15 % (ref 11.5–15.5)
WBC: 5.2 10*3/uL (ref 4.0–10.5)
nRBC: 0 % (ref 0.0–0.2)

## 2023-12-05 LAB — LIPOPROTEIN A (LPA): Lipoprotein (a): 91.2 nmol/L — ABNORMAL HIGH (ref <75.0)

## 2023-12-05 LAB — GLUCOSE, CAPILLARY: Glucose-Capillary: 111 mg/dL — ABNORMAL HIGH (ref 70–99)

## 2023-12-05 MED ORDER — ASPIRIN 81 MG PO TBEC
81.0000 mg | DELAYED_RELEASE_TABLET | Freq: Every day | ORAL | Status: DC
  Administered 2023-12-05 – 2023-12-06 (×2): 81 mg via ORAL
  Filled 2023-12-05 (×2): qty 1

## 2023-12-05 MED ORDER — DIPHENHYDRAMINE HCL 25 MG PO CAPS
25.0000 mg | ORAL_CAPSULE | Freq: Once | ORAL | Status: AC
  Administered 2023-12-05: 25 mg via ORAL
  Filled 2023-12-05: qty 1

## 2023-12-05 MED ORDER — METOPROLOL SUCCINATE ER 25 MG PO TB24
12.5000 mg | ORAL_TABLET | Freq: Every day | ORAL | Status: DC
  Administered 2023-12-05 – 2023-12-06 (×2): 12.5 mg via ORAL
  Filled 2023-12-05 (×2): qty 1

## 2023-12-05 NOTE — Progress Notes (Signed)
 Occupational Therapy Treatment Patient Details Name: Richard [name] MRN: [id number] DOB: 07-25-1956 Today's Date: 12/05/2023   History of present illness Pt is a 68 y.o. male who presented 12/01/23 with L-sided weakness and slurred speech. CTA showed occlusion of the right M1 segment, proximal M2 branch of the right MCA with intraluminal thrombus. S/p mechanical thrombectomy 4/24. CT showed subtle hypoattenuation involving the right caudate and right lentiform nuclei which may reflect acute infarct. MRI pending. PMH: CAD s/p CABG x3 2015, ischemic cardiomyopathy s/p ICD placement, HTN, HLD, HIV, GERD, CVA in 2008 without residual deficits   OT comments  Patient session focus on co-treat with PT in order to address goals and functional deficits. Patient with improved L neglect, and is able to turn L and identify numbers on OT's gloved fingers with improved accuracy. Patient is mod A for bed mobility when rolling to the L, and CGA when holding on footboard with R hand. Patient requiring up to max A to maintain sitting balance when using R hand to complete ADLs. OT and PT using stedy to come into standing, requiring mod A of 2 to come into standing, with heavy L lateral lean in stedy frame. Patient them completing 2 sit<>stands from chair using handles of BSC to promote weight bearing in LUE and LLE. OT continues to highly recommend intensive rehab >3 hours. OT will continue to follow.       If plan is discharge home, recommend the following:  Two people to help with walking and/or transfers;Two people to help with bathing/dressing/bathroom;Assistance with cooking/housework;Assistance with feeding;Direct supervision/assist for medications management;Direct supervision/assist for financial management;Assist for transportation;Help with stairs or ramp for entrance;Supervision due to cognitive status   Equipment Recommendations   (defer to next venue)    Recommendations for Other Services       Precautions / Restrictions Precautions Precautions: Fall;Other (comment) Recall of Precautions/Restrictions: Impaired Precaution/Restrictions Comments: SBP 130-180; watch HR Restrictions Weight Bearing Restrictions Per Provider Order: No       Mobility Bed Mobility Overal bed mobility: Needs Assistance Bed Mobility: Rolling, Sidelying to Sit, Sit to Supine Rolling: Mod assist Sidelying to sit: Mod assist       General bed mobility comments: Mod A  to roll to the L and manage L extremities, benefitting from tactile cues, assist required to come into sitting due to decreased activation on L side, posterior and R lateral lean sitting EOB, able to have moments of CGA when holding onto footboard.  Up to max A for sitting balance when using RUE for ADLs    Transfers Overall transfer level: Needs assistance Equipment used: Ambulation equipment used Transfers: Sit to/from Stand Sit to Stand: Mod assist, +2 physical assistance, +2 safety/equipment           General transfer comment: PT and OT using stedy to come into standing, remains mod A of 2 to complete, heavy L lateral lean in standing, able to complete 2 sit<>stands with BSC frame in front of him to promote weight bearing with LUE and LLE Transfer via Lift Equipment: Stedy   Balance Overall balance assessment: Needs assistance Sitting-balance support: Single extremity supported, Feet supported Sitting balance-Leahy Scale: Poor Sitting balance - Comments: reliant on external assist, posterior and L lateral lean sitting EOB, able to have moments of CGA sitting balance when holding onto footboard with RUE Postural control: Right lateral lean, Posterior lean Standing balance support: Bilateral upper extremity supported, During functional activity, Reliant on assistive device for balance Standing balance-Leahy Scale: Zero  Standing balance comment: reliant on stedy and OT and PT support                           ADL  either performed or assessed with clinical judgement   ADL Overall ADL's : Needs assistance/impaired     Grooming: Wash/dry hands;Wash/dry face;Set up;Sitting Grooming Details (indicate cue type and reason): using R hand only, max A to be held up in sitting by PT                 Toilet Transfer: Moderate assistance;+2 for physical assistance;+2 for safety/equipment;Stand-pivot;BSC/3in1 Toilet Transfer Details (indicate cue type and reason): simulated with stedy Toileting- Clothing Manipulation and Hygiene: Total assistance;+2 for physical assistance;+2 for safety/equipment;Sit to/from stand;Sitting/lateral lean       Functional mobility during ADLs: Moderate assistance;+2 for physical assistance;+2 for safety/equipment;Cueing for safety;Cueing for sequencing General ADL Comments: Patient session focus on co-treat with PT in order to address goals and functional deficits. Patient with improved L neglect, and is able to turn L and identify numbers on OT's gloved fingers with improved accuracy. Patient is mod A for bed mobility when rolling to the L, and CGA when holding on footboard with R hand. Patient requiring up to max A to maintain sitting balance when using R hand to complete ADLs. OT and PT using stedy to come into standing, requiring mod A of 2 to come into standing, with heavy L lateral lean in stedy frame. Patient them completing 2 sit<>stands from chair using handles of BSC to promote weight bearing in LUE and LLE. OT continues to highly recommend intensive rehab >3 hours. OT will continue to follow.    Extremity/Trunk Assessment Upper Extremity Assessment LUE Deficits / Details: sensation intact, no activation except at shoulder, is able to shrug minimally       Cervical / Trunk Assessment Cervical / Trunk Assessment: Normal    Vision       Perception Perception Perception: Impaired Preception Impairment Details: Inattention/Neglect   Praxis Praxis Praxis:  Impaired Praxis Impairment Details: Motor planning;Organization;Initiation   Communication Communication Communication: Impaired Factors Affecting Communication: Reduced clarity of speech   Cognition Arousal: Alert Behavior During Therapy: WFL for tasks assessed/performed Cognition: Cognition impaired     Awareness: Intellectual awareness impaired, Online awareness impaired Memory impairment (select all impairments): Short-term memory Attention impairment (select first level of impairment): Sustained attention Executive functioning impairment (select all impairments): Sequencing, Problem solving OT - Cognition Comments: Improved ability to look L with less cueing, continued decreased awareness and max cues to initiate and motor plan                 Following commands: Intact        Cueing   Cueing Techniques: Verbal cues, Tactile cues, Gestural cues  Exercises      Shoulder Instructions       General Comments HR up to 120s in stedy    Pertinent Vitals/ Pain          Home Living Family/patient expects to be discharged to:: Private residence Living Arrangements: Spouse/significant other Available Help at Discharge: Family;Available 24 hours/day Type of Home: House Home Access: Stairs to enter [employer] of Steps: 2 Entrance Stairs-Rails: Right Home Layout: One level     Bathroom Shower/Tub: Chief Strategy Officer: Standard     Home Equipment: None          Prior Functioning/Environment  Frequency  Min 2X/week        Progress Toward Goals  OT Goals(current goals can now be found in the care plan section)     Acute Rehab OT Goals Patient Stated Goal: to get to rehab OT Goal Formulation: With patient Time For Goal Achievement: 12/16/23 Potential to Achieve Goals: Good  Plan      Co-evaluation      Reason for Co-Treatment: Complexity of the patient's impairments (multi-system  involvement);Necessary to address cognition/behavior during functional activity;For patient/therapist safety;To address functional/ADL transfers PT goals addressed during session: Mobility/safety with mobility;Balance;Proper use of DME;Strengthening/ROM        AM-PAC OT "6 Clicks" Daily Activity     Outcome Measure   Help from another person eating meals?: A Lot Help from another person taking care of personal grooming?: A Little Help from another person toileting, which includes using toliet, bedpan, or urinal?: A Lot Help from another person bathing (including washing, rinsing, drying)?: A Lot Help from another person to put on and taking off regular upper body clothing?: A Lot Help from another person to put on and taking off regular lower body clothing?: Total 6 Click Score: 12    End of Session Equipment Utilized During Treatment: Gait belt;Other (comment) Octaviano Belts)  OT Visit Diagnosis: Unsteadiness on feet (R26.81);Other abnormalities of gait and mobility (R26.89);Muscle weakness (generalized) (M62.81);Other symptoms and signs involving cognitive function;Hemiplegia and hemiparesis Hemiplegia - Right/Left: Left Hemiplegia - dominant/non-dominant: Non-Dominant Hemiplegia - caused by: Cerebral infarction   Activity Tolerance Patient tolerated treatment well   Patient Left in chair;with call bell/phone within reach;with chair alarm set;with family/visitor present   Nurse Communication Need for lift equipment;Mobility status        Time: 9562-1308 OT Time Calculation (min): 26 min  Charges: OT General Charges $OT Visit: 1 Visit OT Treatments $Self Care/Home Management : 8-22 mins  Mollie Anger E. Curlee Bogan, OTR/L Acute Rehabilitation Services [phone]   Vincent Greek 12/05/2023, 10:47 AM

## 2023-12-05 NOTE — TOC Progression Note (Signed)
 Transition of Care [facility]) - Progression Note    Patient Details  Name: Richard [name] MRN: [id number] Date of Birth: [birth year]  Transition of Care [facility]) CM/SW Contact  Jonathan Neighbor, RN Phone Number: 12/05/2023, 3:00 PM  Clinical Narrative:     Awaiting approval from the [facility] for CIR. TOC following.  Expected Discharge Plan: IP Rehab Facility    Expected Discharge Plan and Services                                               Social Determinants of Health (SDOH) Interventions SDOH Screenings   Food Insecurity: No Food Insecurity (12/02/2023)  Housing: Unknown (12/02/2023)  Transportation Needs: No Transportation Needs (12/02/2023)  Utilities: Not At Risk (12/02/2023)  Social Connections: Unknown (12/02/2023)  Tobacco Use: Medium Risk (08/16/2023)   Received from [facility]    Readmission Risk Interventions     No data to display

## 2023-12-05 NOTE — Progress Notes (Addendum)
 Advanced Heart Failure Rounding Note  Cardiologist: Magnus Schuller, MD  Chief Complaint: Acute on Chronic Biventricular HF Subjective:    No daily weights or complete I/Os. sCr stable 1.4  Objective:    Weight Range: 78.6 kg Body mass index is 24.86 kg/m.   Vital Signs:   Temp:  [97.6 F (36.4 C)-98.8 F (37.1 C)] 98.1 F (36.7 C) (04/28 0341) Pulse Rate:  [76-81] 79 (04/28 0341) Resp:  [15-18] 18 (04/28 0341) BP: (101-118)/(65-78) 101/76 (04/28 0341) SpO2:  [96 %-100 %] 97 % (04/28 0341) Last BM Date : 12/01/23  Weight change: Filed Weights   12/01/23 1000  Weight: 78.6 kg   Intake/Output:  Intake/Output Summary (Last 24 hours) at 12/05/2023 0804 Last data filed at 12/05/2023 0630 Gross per 24 hour  Intake 236 ml  Output 500 ml  Net -264 ml    Physical Exam    General: Well appearing. No distress on RA Cardiac: JVP flat. S1 and S2 present. No murmurs or rub. Resp: Lung sounds clear and equal B/L Abdomen: Soft, non-tender, non-distended.  Extremities: Warm and dry.  No edema.  Neuro: Alert and oriented x3. Affect flat  Telemetry   N/A  EKG    No new ECG to review  Labs    CBC Recent Labs    12/04/23 0742 12/05/23 0622  WBC 5.6 5.2  HGB 13.2 14.0  HCT 38.6* 41.1  MCV 81.1 81.9  PLT 116* 144*   Basic Metabolic Panel Recent Labs    40/98/11 0742 12/05/23 0622  NA 138 138  K 4.1 4.0  CL 104 103  CO2 23 24  GLUCOSE 94 87  BUN 12 12  CREATININE 1.40* 1.44*  CALCIUM  9.0 9.5   Liver Function Tests Recent Labs    12/02/23 1717  AST 23  ALT 26  ALKPHOS 49  BILITOT 1.0  PROT 7.0  ALBUMIN 3.6   Fasting Lipid Panel Recent Labs    12/02/23 1717  CHOL 82  HDL 28*  LDLCALC 43  TRIG 53  CHOLHDL 2.9   Medications:    Scheduled Medications:  aspirin  EC  81 mg Oral Daily   atorvastatin   40 mg Oral Daily   clopidogrel   75 mg Oral Daily   dolutegravir -lamiVUDine   1 tablet Oral Daily   enoxaparin  (LOVENOX ) injection  40 mg  Subcutaneous Q24H   ezetimibe   10 mg Oral Daily   famotidine   20 mg Oral Daily   polyethylene glycol  17 g Oral Daily   sodium chloride  flush  3 mL Intravenous Once   Infusions:   PRN Medications: acetaminophen  **OR** acetaminophen  (TYLENOL ) oral liquid 160 mg/5 mL **OR** acetaminophen , mouth rinse, senna-docusate  Patient Profile   Richard [name] is a 68 y.o. 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 Has followed with [facility] since 2015.   Assessment/Plan   Acute on Chronic Biventricular Systolic Heart Failure - longstanding history of HFrEF due to severe ICM. EF 20-25% in 2015. - intermittent history of noncompliance - s/p St. Jude single lead ICD in 2015. Enrolled in iCM. - last echo with EF 30-35%, RWMA, G1DD, mildly reduced RV  - Appears euvolemic by exam. - Off NE. BP stable - Restart Toprol  XL 12.5 mg daily - Would not resume SGLT2i with new mobility concerns - Prev off spiro d/t hypotension   2. Severe CAD - LHC in 2015 with 80-90%  D1, midLAD 80-90%, apical LAD 95%, distal Lcx 95%, and total prox RRA. S/p DES to mid LAD, DES X3 to prox D1, untreated Lcx lesion.  - No chest pain  - DAPT indefinitely: ASA + Plavix  - continue atorvastatin  40 mg daily. LDL <55. LipoA 95.   3. VT - Last episode noted in 2023 with successful ATP - Enrolled in iCM. Last interrogation with 1 NSVT 14b in 3/25. - on tele NSR   4. CVA - presented with stroke like symptoms - CT head + for acute ischemic stroke to R MCA and M1 - s/p thrombectomy - MRI head showing acute stroke as above and chronic occlusion of L ICA - BP goals now liberalized, wean NE as able. - neurology following   5. CKD 3b - baseline creatinine ~1.3-1.5 - stable at baseline   6. HIV - continue dovato   Dispo: CIR  Length of Stay: 4  Richard Zerenity Bowron, NP  12/05/2023, 8:04 AM  Advanced Heart Failure Team Pager [phone]  (M-F; 7a - 5p)  Please contact [facility] for night-coverage after hours (5p -7a ) and weekends on [email or web address]

## 2023-12-05 NOTE — Care Management Important Message (Signed)
 Important Message  Patient Details  Name: Richard [name] MRN: [id number] Date of Birth: 03-24-1956   Important Message Given:  Yes - Medicare IM     Wynonia Hedges 12/05/2023, 3:42 PM

## 2023-12-05 NOTE — Progress Notes (Signed)
 Speech Language Pathology Treatment: Dysphagia;Cognitive-Linquistic  Patient Details Name: Richard [name] MRN: [id number] DOB: 08-31-1955 Today's Date: 12/05/2023 Time: 8657-8469 SLP Time Calculation (min) (ACUTE ONLY): 18 min  Assessment / Plan / Recommendation Clinical Impression  Skilled therapy session focused on dysphagia and dysarthria goals. SLP facilitated session by observing patient with consumption of regular solids/thin liquids. Patient with mild anterior spillage from the L as well as L buccal pocketing, cleared with liquid wash and digital sweep. Patient with intermittent throat clears after initial consecutive sips of thin liquids. SLP encouraged patient to take small, single sips during consumption of liquids. Patient reports tolerance of current diet with occasional difficulty with mastication due to ill fitting lower dentures. Patient did not wear lower dentures during PO trials w/ SLP per preference. Recommend continuation of D3/thin liquid diet per patient preference. Educated patient on choosing softer foods and/or adding condiments to aid in mastication as well as completing liquid wash/digital sweep and occasional throat clears. Patient verbalized understanding.   SLP targeted dysarthria goals through educating patient regarding SLOP (slow, loud, over articulate, pause) speech intelligibility strategies. With use of these strategies, patient is ~80% intelligible at the conversational level.     HPI HPI: Richard [name] is a 68 y.o. 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  Patient was last seen in his usual state of health last night at 6 PM when his wife came to the hospital to see a family  member.  This morning, she called her husband and he initially did not answer but when she called again, she noticed that his speech was slurred and he asked her to come home because something was not right.  When his wife got home, she found the patient on the floor with left-sided weakness, left-sided facial droop, and slurred speech with complaints of a severe headache prompting EMS activation.  On EMS arrival, patient's symptoms were persistent and a code stroke was activated.      SLP Plan  Continue with current plan of care      Recommendations for follow up therapy are one component of a multi-disciplinary discharge planning process, led by the attending physician.  Recommendations may be updated based on patient status, additional functional criteria and insurance authorization.    Recommendations  Diet recommendations: Dysphagia 3 (mechanical soft);Thin liquid Liquids provided via: Cup;Straw Medication Administration: Whole meds with puree Supervision: Intermittent supervision to cue for compensatory strategies Compensations: Slow rate;Small sips/bites;Lingual sweep for clearance of pocketing;Monitor for anterior loss Postural Changes and/or Swallow Maneuvers: Seated upright 90 degrees        Oral care BID     Dysphagia, unspecified (R13.10);Dysarthria and anarthria (R47.1);Attention and concentration deficit Cerebral infarction   Continue with current plan of care     Liala Codispoti M.A., CCC-SLP 12/05/2023, 10:44 AM

## 2023-12-05 NOTE — TOC CAGE-AID Note (Signed)
 Transition of Care [facility]) - CAGE-AID Screening   Patient Details  Name: Richard [name] MRN: [id number] Date of Birth: [birth year]  Transition of Care [facility]) CM/SW Contact:    Modesto Ganoe E Braylon Grenda, LCSW Phone Number: 12/05/2023, 4:35 PM   Clinical Narrative: Patient/wife denied substance use.   CAGE-AID Screening:    Have You Ever Felt You Ought to Cut Down on Your Drinking or Drug Use?: No Have People Annoyed You By Critizing Your Drinking Or Drug Use?: No Have You Felt Bad Or Guilty About Your Drinking Or Drug Use?: No Have You Ever Had a Drink or Used Drugs First Thing In The Morning to Steady Your Nerves or to Get Rid of a Hangover?: No CAGE-AID Score: 0  Substance Abuse Education Offered: No

## 2023-12-05 NOTE — Plan of Care (Signed)
  Problem: Ischemic Stroke/TIA Tissue Perfusion: Goal: Complications of ischemic stroke/TIA will be minimized 12/05/2023 0160 by Francena Infield, RN Outcome: Progressing 12/05/2023 0502 by Francena Infield, RN Outcome: Progressing   Problem: Education: Goal: Knowledge of disease or condition will improve 12/05/2023 0611 by Francena Infield, RN Outcome: Progressing 12/05/2023 0502 by Francena Infield, RN Outcome: Progressing Goal: Knowledge of secondary prevention will improve (MUST DOCUMENT ALL) 12/05/2023 0611 by Francena Infield, RN Outcome: Progressing 12/05/2023 0502 by Francena Infield, RN Outcome: Progressing Goal: Knowledge of patient specific risk factors will improve (DELETE if not current risk factor) 12/05/2023 0611 by Francena Infield, RN Outcome: Progressing 12/05/2023 0502 by Francena Infield, RN Outcome: Progressing   Problem: Health Behavior/Discharge Planning: Goal: Ability to manage health-related needs will improve 12/05/2023 0611 by Francena Infield, RN Outcome: Progressing 12/05/2023 0502 by Francena Infield, RN Outcome: Progressing Goal: Goals will be collaboratively established with patient/family 12/05/2023 [phone] by Francena Infield, RN Outcome: Progressing 12/05/2023 0502 by Francena Infield, RN Outcome: Progressing   Problem: Education: Goal: Understanding of CV disease, CV risk reduction, and recovery process will improve 12/05/2023 0611 by Francena Infield, RN Outcome: Progressing 12/05/2023 0502 by Francena Infield, RN Outcome: Progressing Goal: Individualized Educational Video(s) 12/05/2023 0611 by Francena Infield, RN Outcome: Progressing 12/05/2023 0502 by Francena Infield, RN Outcome: Progressing   Problem: Cardiovascular: Goal: Ability to achieve and maintain adequate cardiovascular perfusion will improve 12/05/2023 0611 by Francena Infield,  RN Outcome: Progressing 12/05/2023 0502 by Francena Infield, RN Outcome: Progressing Goal: Vascular access site(s) Level 0-1 will be maintained 12/05/2023 0611 by Francena Infield, RN Outcome: Progressing 12/05/2023 0502 by Francena Infield, RN Outcome: Progressing

## 2023-12-05 NOTE — TOC Progression Note (Signed)
 Transition of Care [facility]) - Progression Note    Patient Details  Name: Richard [name] MRN: [id number] Date of Birth: [birth year]  Transition of Care [facility]) CM/SW Contact  Ernst Heap Phone Number: [phone] 12/05/2023, 4:13 PM  Clinical Narrative:   [facility] CSW met with patient and wife at bedside. Patient and wife both stated at this time they do not need anything or have questions at this time.    TOC will continue following.     Expected Discharge Plan: IP Rehab Facility    Expected Discharge Plan and Services                                               Social Determinants of Health (SDOH) Interventions SDOH Screenings   Food Insecurity: No Food Insecurity (12/02/2023)  Housing: Unknown (12/02/2023)  Transportation Needs: No Transportation Needs (12/02/2023)  Utilities: Not At Risk (12/02/2023)  Social Connections: Unknown (12/02/2023)  Tobacco Use: Medium Risk (08/16/2023)   Received from [facility]    Readmission Risk Interventions     No data to display

## 2023-12-05 NOTE — Plan of Care (Signed)
  Problem: Nutrition: Goal: Risk of aspiration will decrease Outcome: Progressing Goal: Dietary intake will improve Outcome: Progressing   Problem: Education: Goal: Understanding of CV disease, CV risk reduction, and recovery process will improve Outcome: Progressing Goal: Individualized Educational Video(s) Outcome: Progressing   Problem: Cardiovascular: Goal: Ability to achieve and maintain adequate cardiovascular perfusion will improve Outcome: Progressing Goal: Vascular access site(s) Level 0-1 will be maintained Outcome: Progressing   Problem: Health Behavior/Discharge Planning: Goal: Ability to safely manage health-related needs after discharge will improve Outcome: Progressing   Problem: Clinical Measurements: Goal: Ability to maintain clinical measurements within normal limits will improve Outcome: Progressing Goal: Will remain free from infection Outcome: Progressing Goal: Diagnostic test results will improve Outcome: Progressing Goal: Respiratory complications will improve Outcome: Progressing Goal: Cardiovascular complication will be avoided Outcome: Progressing   Problem: Pain Managment: Goal: General experience of comfort will improve and/or be controlled Outcome: Progressing   Problem: Safety: Goal: Ability to remain free from injury will improve Outcome: Progressing   Problem: Elimination: Goal: Will not experience complications related to bowel motility Outcome: Progressing Goal: Will not experience complications related to urinary retention Outcome: Progressing   Problem: Skin Integrity: Goal: Risk for impaired skin integrity will decrease Outcome: Progressing

## 2023-12-05 NOTE — PMR Pre-admission (Signed)
 PMR Admission Coordinator Pre-Admission Assessment  Patient: Richard [name] is an 68 y.o., male MRN: [id number] DOB: [birth year] Height: 5\' 10"  (177.8 cm) Weight: 78.6 kg  Insurance Information HMO:     PPO:      PCP:      IPA:      80/20:      OTHER:  PRIMARY: [facility]       Policy#: ***      Subscriber: *** CM Name: ***      Phone#: ***     Fax#: *** Pre-Cert#: ***      Employer: *** Benefits:  Phone #: ***     Name: *** Eff. Date: ***     Deduct: ***      Out of Pocket Max: ***      Life Max: *** CIR: ***      SNF: *** Outpatient: ***     Co-Pay: *** Home Health: ***      Co-Pay: *** DME: ***     Co-Pay: *** Providers: *** SECONDARY:   [employer] Medicare advantage    Policy#:  [id number]    Phone#:   Financial Counselor:       Phone#:   The "Data Collection Information Summary" for patients in Inpatient Rehabilitation Facilities with attached "Privacy Act Statement-Health Care Records" was provided and verbally reviewed with: Patient  Emergency Contact Information Contact Information     Name Relation Home Work Mobile   Wagar,Veronica Spouse [phone]        Other Contacts   None on File     Current Medical History  Patient Admitting Diagnosis: CVA History of Present Illness: Richard [name] is a 68 y.o. 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  Pt.'s wife came home and found patient on the floor with left-sided weakness, left-sided facial droop, and slurred speech with complaints of a severe headache prompting EMS activation.  On EMS arrival, patient's symptoms were persistent and a code stroke was activated. CTA showed occlusion of the right M1  segment, proximal M2 branch of the right MCA with intraluminal thrombus. S/p mechanical thrombectomy 4/24. CT showed subtle hypoattenuation involving the right caudate and right lentiform nuclei which may reflect acute infarct. Pt. Was seen by PT/OT/SLP and they recommended CIR to assist return to PLOF.  Complete NIHSS TOTAL: 13  Patient's medical record from [facility] has been reviewed by the rehabilitation admission coordinator and physician.  Past Medical History  Past Medical History:  Diagnosis Date   Coronary artery disease    a. cath 04/02/2014 3v dx 80-90% in D1, 80-90% mid LAD, 95% apical LAD, 95% distal inferior LV branch of LCx, total occlusion of prox RCA  b). Cath 04/03/2014 DES to mid LAD, DES x3 to prox D1, DAPT indefinitely. LCx lesion untreated   GERD (gastroesophageal reflux disease)    HIV (human immunodeficiency virus infection) (HCC)    Hypertension    Ischemic cardiomyopathy 08/09/2013   EF 20-25 percent by echo in 04/2014, s/p St. Jude Fortify Assura model [id number] serial number [id number]   Stroke [facility])     Has the patient had major surgery during 100 days prior to admission? Yes  Family History  family history includes Heart disease in his mother.  Current Medications  Current Facility-Administered Medications:    acetaminophen  (TYLENOL ) tablet 650 mg, 650 mg, Oral, Q4H PRN, 650 mg at 12/05/23 0857 **OR** acetaminophen  (TYLENOL ) 160 MG/5ML solution 650 mg, 650 mg, Per Tube, Q4H PRN **OR** acetaminophen  (TYLENOL ) suppository 650 mg, 650 mg, Rectal, Q4H PRN, Toberman, Stevi W, NP   aspirin  EC tablet 81 mg, 81 mg, Oral, Daily, de La Torre, Waverly E, NP, 81 mg at 12/05/23 0857   atorvastatin  (LIPITOR) tablet 40 mg, 40 mg, Oral, Daily, Lee, [place], NP, 40 mg at 12/05/23 4098   clopidogrel  (PLAVIX ) tablet 75 mg, 75 mg, Oral, Daily, de La Torre, [place] E, NP, 75 mg at 12/05/23 1191   dolutegravir -lamiVUDine  (DOVATO ) 50-300 MG per tablet 1 tablet, 1  tablet, Oral, Daily, Janas Meadows, Lauren E, PA-C, 1 tablet at 12/05/23 0857   enoxaparin  (LOVENOX ) injection 40 mg, 40 mg, Subcutaneous, Q24H, Toberman, Stevi W, NP, 40 mg at 12/05/23 4782   ezetimibe  (ZETIA ) tablet 10 mg, 10 mg, Oral, Daily, Consuelo Denmark, MD, 10 mg at 12/05/23 9562   famotidine  (PEPCID ) tablet 20 mg, 20 mg, Oral, Daily, Autry, Lauren E, PA-C, 20 mg at 12/05/23 1308   metoprolol  succinate (TOPROL -XL) 24 hr tablet 12.5 mg, 12.5 mg, Oral, Daily, Lee, [place], NP, 12.5 mg at 12/05/23 1222   Oral care mouth rinse, 15 mL, Mouth Rinse, PRN, Eleni Griffin, MD   polyethylene glycol (MIRALAX / GLYCOLAX) packet 17 g, 17 g, Oral, Daily, Amelie Baize, Erin C, NP, 17 g at 12/05/23 6578   senna-docusate (Senokot-S) tablet 1 tablet, 1 tablet, Oral, QHS PRN, Toberman, Stevi W, NP, 1 tablet at 12/04/23 2139   sodium chloride  flush (NS) 0.9 % injection 3 mL, 3 mL, Intravenous, Once, Trish Furl, MD  Patients Current Diet:  Diet Order             DIET DYS 3 Room service appropriate? Yes with Assist; Fluid consistency: Thin  Diet effective now                   Precautions / Restrictions Precautions Precautions: Fall, Other (comment) Precaution/Restrictions Comments: SBP 130-180; watch HR Restrictions Weight Bearing Restrictions Per Provider Order: No   Has the patient had 2 or more falls or a fall with injury in the past year? Yes  Prior Activity Level Community (5-7x/wk): Pt. active in the community PTA  Prior Functional Level Self Care: Did the patient need help bathing, dressing, using the toilet or eating? Independent  Indoor Mobility: Did the patient need assistance with walking from room to room (with or without device)? Independent  Stairs: Did the patient need assistance with internal or external stairs (with or without device)? Independent  Functional Cognition: Did the patient need help planning regular tasks such as shopping or remembering to take medications?  Independent  Patient Information Are you of Hispanic, Latino/a,or Spanish origin?: A. No, not of Hispanic, Latino/a, or Spanish origin What is your race?: A. White Do you need or want an interpreter to communicate with a doctor or health care staff?: 0. No  Patient's Response To:  Health Literacy and Transportation Is the patient able to respond to health literacy and transportation needs?: Yes Health Literacy - How often do you need to have someone help you when you read instructions, pamphlets, or other written material from your doctor or pharmacy?: Never In the past 12 months, has lack of transportation kept you from medical appointments or from getting  medications?: No In the past 12 months, has lack of transportation kept you from meetings, work, or from getting things needed for daily living?: No  Journalist, newspaper / Equipment Home Equipment: None  Prior Device Use: Indicate devices/aids used by the patient prior to current illness, exacerbation or injury? None of the above  Current Functional Level Cognition  Arousal/Alertness: Awake/alert Overall Cognitive Status:  (further assessment needed) Orientation Level: Oriented X4 Attention: Focused Focused Attention: Impaired Focused Attention Impairment: Functional basic (L inattention/neglect) Memory: Appears intact Awareness: Appears intact Problem Solving: Appears intact (further assessment needed)    Extremity Assessment (includes Sensation/Coordination)  Upper Extremity Assessment: Defer to OT evaluation LUE Deficits / Details: sensation intact, no activation except at shoulder, is able to shrug minimally LUE Sensation: WNL LUE Coordination: decreased fine motor, decreased gross motor  Lower Extremity Assessment: LLE deficits/detail LLE Deficits / Details: trace quads and gastrocs activation noted when cued, noted improved quads activation when standing, generally very weak in L leg; able to detect touch    ADLs   Overall ADL's : Needs assistance/impaired Eating/Feeding: Moderate assistance, Sitting Grooming: Wash/dry hands, Wash/dry face, Set up, Sitting Grooming Details (indicate cue type and reason): using R hand only, max A to be held up in sitting by PT Upper Body Bathing: Moderate assistance, Sitting Lower Body Bathing: Maximal assistance, Total assistance, Sit to/from stand, Sitting/lateral leans Upper Body Dressing : Moderate assistance, Sitting Lower Body Dressing: Maximal assistance, Total assistance, Sit to/from stand, Sitting/lateral leans Toilet Transfer: Moderate assistance, +2 for physical assistance, +2 for safety/equipment, Stand-pivot, BSC/3in1 Toilet Transfer Details (indicate cue type and reason): simulated with stedy Toileting- Clothing Manipulation and Hygiene: Total assistance, +2 for physical assistance, +2 for safety/equipment, Sit to/from stand, Sitting/lateral lean Functional mobility during ADLs: Moderate assistance, +2 for physical assistance, +2 for safety/equipment, Cueing for safety, Cueing for sequencing General ADL Comments: Patient session focus on co-treat with PT in order to address goals and functional deficits. Patient with improved L neglect, and is able to turn L and identify numbers on OT's gloved fingers with improved accuracy. Patient is mod A for bed mobility when rolling to the L, and CGA when holding on footboard with R hand. Patient requiring up to max A to maintain sitting balance when using R hand to complete ADLs. OT and PT using stedy to come into standing, requiring mod A of 2 to come into standing, with heavy L lateral lean in stedy frame. Patient them completing 2 sit<>stands from chair using handles of BSC to promote weight bearing in LUE and LLE. OT continues to highly recommend intensive rehab >3 hours. OT will continue to follow.    Mobility  Overal bed mobility: Needs Assistance Bed Mobility: Rolling, Sidelying to Sit Rolling: Mod assist Sidelying  to sit: Mod assist, HOB elevated, Used rails Sit to supine: Max assist, +2 for physical assistance, +2 for safety/equipment General bed mobility comments: assist with LLE and trunk    Transfers  Overall transfer level: Needs assistance Equipment used: Ambulation equipment used Transfers: Sit to/from Stand, Bed to chair/wheelchair/BSC Sit to Stand: Mod assist, +2 physical assistance, +2 safety/equipment Bed to/from chair/wheelchair/BSC transfer type:: Via Lift equipment Transfer via Lift Equipment: Stedy General transfer comment: STS in stedy with +2 mod assist. Heavy L lean. Stedy transfer bed to recliner.    Ambulation / Gait / Stairs / Wheelchair Mobility  Ambulation/Gait General Gait Details: deferred    Posture / Balance Dynamic Sitting Balance Sitting balance - Comments: reliant on external  assist, posterior and L lateral lean sitting EOB, able to have moments of CGA sitting balance when holding onto footboard with RUE Balance Overall balance assessment: Needs assistance Sitting-balance support: Single extremity supported, Feet supported Sitting balance-Leahy Scale: Poor Sitting balance - Comments: reliant on external assist, posterior and L lateral lean sitting EOB, able to have moments of CGA sitting balance when holding onto footboard with RUE Postural control: Right lateral lean, Posterior lean Standing balance support: During functional activity, Reliant on assistive device for balance, Single extremity supported Standing balance-Leahy Scale: Zero Standing balance comment: reliant on stedy and OT and PT support    Special needs/care consideration Skin intact   Previous Home Environment (from acute therapy documentation) Living Arrangements: Spouse/significant other Available Help at Discharge: Family, Available 24 hours/day Type of Home: House Home Layout: One level Home Access: Stairs to enter Entrance Stairs-Rails: Right Entrance Stairs-Number of Steps: 2 Bathroom  Shower/Tub: Engineer, manufacturing systems: Standard Bathroom Accessibility: Yes How Accessible: Accessible via walker Home Care Services: No  Discharge Living Setting Plans for Discharge Living Setting: Patient's home Type of Home at Discharge: House Discharge Home Layout: One level Discharge Home Access: Stairs to enter Entrance Stairs-Rails: Right Entrance Stairs-Number of Steps: 2 Discharge Bathroom Shower/Tub: Tub/shower unit Discharge Bathroom Toilet: Standard Discharge Bathroom Accessibility: Yes How Accessible: Accessible via walker Does the patient have any problems obtaining your medications?: No  Social/Family/Support Systems Patient Roles: Spouse Contact Information: [phone] Anticipated Caregiver: [name] Anticipated Caregiver's Contact Information: Wife works, plans to hire caregivers or get VA caregivers Caregiver Availability: 24/7 Discharge Plan Discussed with Primary Caregiver: Yes Is Caregiver In Agreement with Plan?: No Does Caregiver/Family have Issues with Lodging/Transportation while Pt is in Rehab?: No  Goals Patient/Family Goal for Rehab: PT/OT/SLP Min A Expected length of stay: 12-14 days Pt/Family Agrees to Admission and willing to participate: Yes Program Orientation Provided & Reviewed with Pt/Caregiver Including Roles  & Responsibilities: Yes  Decrease burden of Care through IP rehab admission: not anticipated  Possible need for SNF placement upon discharge: not anticipated  Patient Condition: I have reviewed medical records from [facility], spoken with CM, and patient and spouse. I met with patient at the bedside for inpatient rehabilitation assessment.  Patient will benefit from ongoing PT, OT, and SLP, can actively participate in 3 hours of therapy a day 5 days of the week, and can make measurable gains during the admission.  Patient will also benefit from the coordinated team approach during an Inpatient Acute  Rehabilitation admission.  The patient will receive intensive therapy as well as Rehabilitation physician, nursing, social worker, and care management interventions.  Due to safety, skin/wound care, disease management, medication administration, pain management, and patient education the patient requires 24 hour a day rehabilitation nursing.  The patient is currently ** with mobility and basic ADLs.  Discharge setting and therapy post discharge at home with home health is anticipated.  Patient has agreed to participate in the Acute Inpatient Rehabilitation Program and will admit {Time; today/tomorrow:10263}.  Preadmission Screen Completed By:  Dorena Gander, 12/05/2023 12:59 PM ______________________________________________________________________   Discussed status with Dr. Aaron Aas on *** at *** and received approval for admission today.  Admission Coordinator:  Dorena Gander, CCC-SLP, time Aaron AasAlanna Hu ***   Assessment/Plan: Diagnosis: *** Does the need for close, 24 hr/day Medical supervision in concert with the patient's rehab needs make it unreasonable for this patient to be served in a less intensive setting? {yes_no_potentially:3041433} Co-Morbidities requiring supervision/potential complications: ***  Due to {due ZO:1096045}, does the patient require 24 hr/day rehab nursing? {yes_no_potentially:3041433} Does the patient require coordinated care of a physician, rehab nurse, PT, OT, and SLP to address physical and functional deficits in the context of the above medical diagnosis(es)? {yes_no_potentially:3041433} Addressing deficits in the following areas: {deficits:3041436} Can the patient actively participate in an intensive therapy program of at least 3 hrs of therapy 5 days a week? {yes_no_potentially:3041433} The potential for patient to make measurable gains while on inpatient rehab is {potential:3041437} Anticipated functional outcomes upon discharge from inpatient rehab: {functional  outcomes:304600100} PT, {functional outcomes:304600100} OT, {functional outcomes:304600100} SLP Estimated rehab length of stay to reach the above functional goals is: *** Anticipated discharge destination: {anticipated dc setting:21604} 10. Overall Rehab/Functional Prognosis: {potential:3041437}   MD Signature: ***

## 2023-12-05 NOTE — Progress Notes (Signed)
 Physical Therapy Treatment Patient Details Name: Richard [name] MRN: [id number] DOB: Jul 04, 1956 Today's Date: 12/05/2023   History of Present Illness Pt is a 68 y.o. male who presented 12/01/23 with L-sided weakness and slurred speech. CTA showed occlusion of the right M1 segment, proximal M2 branch of the right MCA with intraluminal thrombus. S/p mechanical thrombectomy 4/24. CT showed subtle hypoattenuation involving the right caudate and right lentiform nuclei which may reflect acute infarct. MRI pending. PMH: CAD s/p CABG x3 2015, ischemic cardiomyopathy s/p ICD placement, HTN, HLD, HIV, GERD, CVA in 2008 without residual deficits    PT Comments  Pt required mod assist bed mobility, +2 mod assist sit to stand in stedy, and dependent stedy transfer bed to recliner. Poor sitting balance and zero standing balance. Improved eccentric control with stand to sit. L inattention but able to attend to L with min verbal cues. Pt in recliner with feet elevated at end of session.     If plan is discharge home, recommend the following: Two people to help with walking and/or transfers;Two people to help with bathing/dressing/bathroom;Assistance with cooking/housework;Assist for transportation;Help with stairs or ramp for entrance   Can travel by private vehicle        Equipment Recommendations  Other (comment) (TBD)    Recommendations for Other Services       Precautions / Restrictions Precautions Precautions: Fall;Other (comment) Recall of Precautions/Restrictions: Impaired Precaution/Restrictions Comments: SBP 130-180; watch HR     Mobility  Bed Mobility Overal bed mobility: Needs Assistance Bed Mobility: Rolling, Sidelying to Sit Rolling: Mod assist Sidelying to sit: Mod assist, HOB elevated, Used rails       General bed mobility comments: assist with LLE and trunk    Transfers Overall transfer level: Needs assistance Equipment used: Ambulation equipment used Transfers: Sit  to/from Stand, Bed to chair/wheelchair/BSC Sit to Stand: Mod assist, +2 physical assistance, +2 safety/equipment           General transfer comment: STS in stedy with +2 mod assist. Heavy L lean. Stedy transfer bed to recliner. Transfer via Lift Equipment: Stedy  Ambulation/Gait                   Stairs             Wheelchair Mobility     Tilt Bed    Modified Rankin (Stroke Patients Only) Modified Rankin (Stroke Patients Only) Pre-Morbid Rankin Score: No symptoms Modified Rankin: Severe disability     Balance Overall balance assessment: Needs assistance Sitting-balance support: Single extremity supported, Feet supported Sitting balance-Leahy Scale: Poor   Postural control: Right lateral lean, Posterior lean Standing balance support: During functional activity, Reliant on assistive device for balance, Single extremity supported Standing balance-Leahy Scale: Zero                              Communication Communication Communication: Impaired Factors Affecting Communication: Reduced clarity of speech;Other (comment) (saliva pooling in mouth)  Cognition Arousal: Alert Behavior During Therapy: WFL for tasks assessed/performed   PT - Cognitive impairments: Awareness, Problem solving                       PT - Cognition Comments: L inattention        Cueing Cueing Techniques: Verbal cues, Tactile cues, Gestural cues  Exercises      General Comments General comments (skin integrity, edema, etc.): HR into 120s during mobility  Pertinent Vitals/Pain Pain Assessment Pain Assessment: No/denies pain    Home Living Family/patient expects to be discharged to:: Private residence Living Arrangements: Spouse/significant other Available Help at Discharge: Family;Available 24 hours/day Type of Home: House Home Access: Stairs to enter Entrance Stairs-Rails: Right Entrance Stairs-Number of Steps: 2   Home Layout: One  level Home Equipment: None      Prior Function            PT Goals (current goals can now be found in the care plan section) Acute Rehab PT Goals Patient Stated Goal: home Progress towards PT goals: Progressing toward goals    Frequency    Min 3X/week      PT Plan      Co-evaluation PT/OT/SLP Co-Evaluation/Treatment: Yes Reason for Co-Treatment: Complexity of the patient's impairments (multi-system involvement);Necessary to address cognition/behavior during functional activity;For patient/therapist safety;To address functional/ADL transfers PT goals addressed during session: Mobility/safety with mobility;Balance;Proper use of DME;Strengthening/ROM        AM-PAC PT "6 Clicks" Mobility   Outcome Measure  Help needed turning from your back to your side while in a flat bed without using bedrails?: A Lot Help needed moving from lying on your back to sitting on the side of a flat bed without using bedrails?: Total Help needed moving to and from a bed to a chair (including a wheelchair)?: Total Help needed standing up from a chair using your arms (e.g., wheelchair or bedside chair)?: Total Help needed to walk in hospital room?: Total Help needed climbing 3-5 steps with a railing? : Total 6 Click Score: 7    End of Session Equipment Utilized During Treatment: Gait belt Activity Tolerance: Patient tolerated treatment well Patient left: in chair;with call bell/phone within reach;with chair alarm set;with family/visitor present Nurse Communication: Mobility status PT Visit Diagnosis: Unsteadiness on feet (R26.81);Other abnormalities of gait and mobility (R26.89);Muscle weakness (generalized) (M62.81);Difficulty in walking, not elsewhere classified (R26.2);Other symptoms and signs involving the nervous system (R29.898);Hemiplegia and hemiparesis Hemiplegia - Right/Left: Left Hemiplegia - dominant/non-dominant: Non-dominant Hemiplegia - caused by: Cerebral infarction      Time: 0932-0959 PT Time Calculation (min) (ACUTE ONLY): 27 min  Charges:    $Therapeutic Activity: 8-22 mins PT General Charges $$ ACUTE PT VISIT: 1 Visit                     Dorothye Gathers., PT  Office # [phone]    Guadelupe Leech 12/05/2023, 10:54 AM

## 2023-12-05 NOTE — Progress Notes (Addendum)
 STROKE TEAM PROGRESS NOTE    SIGNIFICANT HOSPITAL EVENTS 4/24: Patient admitted with right MCA occlusion, mechanical thrombectomy performed with TICI 2a revascularization achieved 4/25: MRI reveals moderate size acute infarct in the right MCA distribution.  Chronic occlusion of the left ICA. TTE shows EF 20-25%, HF consulted and recommends repeat echo, which is ordered.   INTERIM HISTORY/SUBJECTIVE  Stable neuro exam;left hemianopia, left facial droop, dysarthria, flaccid left side, left neglect.  Currently on DAPT, DOAC starting 4/29 GDMT pending BP control, appreciate HF assistance.  Discharge planning for CIR, stable for discharge to CIR from a neurology standpoint.   OBJECTIVE  CBC    Component Value Date/Time   WBC 5.2 12/05/2023 0622   RBC 5.02 12/05/2023 0622   HGB 14.0 12/05/2023 0622   HCT 41.1 12/05/2023 0622   PLT 144 (L) 12/05/2023 0622   MCV 81.9 12/05/2023 0622   MCH 27.9 12/05/2023 0622   MCHC 34.1 12/05/2023 0622   RDW 15.0 12/05/2023 0622   LYMPHSABS 1.0 12/01/2023 1029   MONOABS 0.4 12/01/2023 1029   EOSABS 0.1 12/01/2023 1029   BASOSABS 0.0 12/01/2023 1029    BMET    Component Value Date/Time   NA 138 12/05/2023 0622   NA 139 02/03/2023 0950   K 4.0 12/05/2023 0622   CL 103 12/05/2023 0622   CO2 24 12/05/2023 0622   GLUCOSE 87 12/05/2023 0622   BUN 12 12/05/2023 0622   BUN 11 02/03/2023 0950   CREATININE 1.44 (H) 12/05/2023 0622   CREATININE 1.38 (H) 10/01/2016 1547   CALCIUM  9.5 12/05/2023 0622   EGFR 51 (L) 02/03/2023 0950   GFRNONAA 54 (L) 12/05/2023 0622    IMAGING past 24 hours No results found.   Vitals:   12/04/23 1933 12/04/23 2359 12/05/23 0341 12/05/23 0828  BP: 104/77 111/78 101/76 109/75  Pulse: 81 77 79 81  Resp: 18 18 18 16   Temp: 97.6 F (36.4 C) 98.2 F (36.8 C) 98.1 F (36.7 C) 98.4 F (36.9 C)  TempSrc: Oral Oral Oral Oral  SpO2: 99% 96% 97% 98%  Weight:      Height:         PHYSICAL EXAM General:  Alert,  well-nourished, well-developed patient in no acute distress Psych:  Mood and affect appropriate for situation CV: Regular rate and rhythm on monitor Respiratory:  Regular, unlabored respirations on supplemental O2   NEURO:  Mental Status: AA&Ox3, patient is able to give some history Speech/Language: Mild dysarthria, no aphasia  Cranial Nerves:  II: PERRL. Left hemianopia.  III, IV, VI: EOMI. Eyelids elevate symmetrically.  V: Sensation is intact to light touch and symmetrical to face.  VII: Left facial droop VIII: hearing intact to voice. IX, X: Voice is mildly dysarthric, no hoarseness.  XII: tongue is midline without fasciculations. Motor:  RUE/RLE: 5/5, no drift LUE: 0/5, no movement, no grip strength LLE:0/5, no movement, unable to wiggle toes.  Tone: is normal and bulk is normal Sensation- Intact to light touch bilaterally. Extinction to DSS present on the left Coordination: FTN intact on the right Gait- deferred  Most Recent NIH  TOTAL: 14   ASSESSMENT/PLAN  [name] is a 68 y.o. male with history of stroke, HIV infection, CAD, MI, ischemic cardiomyopathy, CHF, ICD, V. tach and hyperlipidemia admitted for acute onset dysarthria, left-sided weakness and left facial droop.  He was found to have right MCA occlusion and was taken to IR for mechanical thrombectomy with  TICI 2a revascularization achieved.  MRI reveals acute infarct in the right MCA distribution.  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 Cardioembolic from severe cardiomyopathy with low EF. Code Stroke CT head hypoattenuation involving right caudate and right lentiform nuclei possibly reflecting acute infarct, asymmetric density of right M1 segment ASPECTS 8 CTA head & neck occlusion of right M1 segment as well as proximal M2 branch of the right MCA with intraluminal thrombus  noted.  Chronic left ICA occlusion, reconstitution of the supraclinoid left ICA as well as the left ACA and left MCA near the circle of Willis. CT perfusion 0 mL core and 143 mL penumbra in right MCA territory MRI moderate acute infarct in right MCA distribution 2D Echo EF 20 to 25% LDL 37 HgbA1c 5.6 VTE prophylaxis - Lovenox  aspirin  81 mg daily prior to admission, now on aspirin  81 mg daily and clopidogrel  75 mg daily. Cardiology recommend [facility], will plan to start eliquis tomorrow.  Therapy recommendations:  CIR Disposition: Pending  Intracranial and extracranial stenosis/occlusion CTA head & neck occlusion of right M1 segment as well as proximal M2 branch of the right MCA with intraluminal thrombus noted.   CTA also showed Chronic left ICA occlusion, reconstitution of the supraclinoid left ICA as well as the left ACA and left MCA near the circle of Willis. Per Dr. Laverta Potters IR note - Acute on chronic occlusive disease, right M1 and M2, with stroke  Continue ASA and plavix    CHF s/p ICD Severe CAD/MI Patient has history of CHF status post ICD implantation (2015) He has a single-chamber defibrillator which would not be reliable to detect atrial fibrillation.  Echo EF 20 to 25% Restarted Toprol  XL 12.5 mg daily Would not resume SGLT2i with new mobility concerns Previous off spiro d/t hypotension Plan to start DOAC tomorrow  History of hypertension Now hypotensive Home meds: Metoprolol  XL 12.5 mg daily Stable on the low end off norepinephrine  On metoprolol  XL 12.5 daily now Long term BP goal normotensive  Hyperlipidemia Home meds: Atorvastatin  40 mg daily and ezetimibe  10 mg daily LDL 37, goal < 70 Home lipitor and Zetia  resumed Continue statin and Zetia  at discharge  Dysphagia Patient has post-stroke dysphagia SLP consulted On dysphagia 3 and thin liquid Advance diet as tolerated  Other stroke risk factors Advanced age Patient has a history of a stroke in 2008 with no  residual deficits  Other Active Problems HIV infection - continue home Dovato   Hospital day # 4  Patient seen and examined by NP/APP with MD. MD to update note as needed.   Richard Mana, DNP, FNP-BC [facility] Pager: [phone]   ATTENDING NOTE: I reviewed above note and agree with the assessment and plan. Pt was seen and examined.   Sister is at the bedside, wife is on the phone. Pt reclining in bed, ate good on breakfast. Still has left HH and hemiplegia but able to cross midline to the left and left LE 1/5 strength. Cardiology on board, restarted Toprol , BP has improved some. On DAPT now and will start DOAC in am. PT and OT recommend CIR. Medically stable for CIR placement.   For detailed assessment and plan, please refer to above as I have made changes wherever appropriate.   I had long discussion with sister at bedside and wife over the phone, updated pt current condition, treatment plan and potential prognosis, and  answered all the questions. They expressed understanding and appreciation.    Consuelo Denmark, MD PhD Stroke Neurology 12/05/2023 1:47 PM

## 2023-12-05 NOTE — Progress Notes (Signed)
 Inpatient Rehab Admissions Coordinator:    CIR following, will send case to the [place] today.   Wandalee Gust, MS, CCC-SLP Rehab Admissions Coordinator  [phone] (celll) [phone] (office)

## 2023-12-06 ENCOUNTER — Other Ambulatory Visit: Payer: Self-pay

## 2023-12-06 ENCOUNTER — Inpatient Hospital Stay (HOSPITAL_COMMUNITY)
Admission: AD | Admit: 2023-12-06 | Discharge: 2024-01-05 | DRG: 056 | Disposition: A | Discharge status: Home Health | Source: Intra-hospital | Attending: Physical Medicine & Rehabilitation | Admitting: Physical Medicine & Rehabilitation

## 2023-12-06 DIAGNOSIS — M25561 Pain in right knee: Secondary | ICD-10-CM | POA: Diagnosis present

## 2023-12-06 DIAGNOSIS — R7989 Other specified abnormal findings of blood chemistry: Secondary | ICD-10-CM | POA: Diagnosis not present

## 2023-12-06 DIAGNOSIS — R1312 Dysphagia, oropharyngeal phase: Secondary | ICD-10-CM | POA: Diagnosis present

## 2023-12-06 DIAGNOSIS — I959 Hypotension, unspecified: Secondary | ICD-10-CM | POA: Diagnosis not present

## 2023-12-06 DIAGNOSIS — Z7902 Long term (current) use of antithrombotics/antiplatelets: Secondary | ICD-10-CM | POA: Diagnosis not present

## 2023-12-06 DIAGNOSIS — F419 Anxiety disorder, unspecified: Secondary | ICD-10-CM | POA: Diagnosis present

## 2023-12-06 DIAGNOSIS — Z95810 Presence of automatic (implantable) cardiac defibrillator: Secondary | ICD-10-CM

## 2023-12-06 DIAGNOSIS — Z8249 Family history of ischemic heart disease and other diseases of the circulatory system: Secondary | ICD-10-CM | POA: Diagnosis not present

## 2023-12-06 DIAGNOSIS — B20 Human immunodeficiency virus [HIV] disease: Secondary | ICD-10-CM | POA: Diagnosis not present

## 2023-12-06 DIAGNOSIS — F32A Depression, unspecified: Secondary | ICD-10-CM | POA: Diagnosis present

## 2023-12-06 DIAGNOSIS — I69322 Dysarthria following cerebral infarction: Secondary | ICD-10-CM | POA: Diagnosis not present

## 2023-12-06 DIAGNOSIS — Z7901 Long term (current) use of anticoagulants: Secondary | ICD-10-CM

## 2023-12-06 DIAGNOSIS — F418 Other specified anxiety disorders: Secondary | ICD-10-CM

## 2023-12-06 DIAGNOSIS — I130 Hypertensive heart and chronic kidney disease with heart failure and stage 1 through stage 4 chronic kidney disease, or unspecified chronic kidney disease: Secondary | ICD-10-CM | POA: Diagnosis present

## 2023-12-06 DIAGNOSIS — M25562 Pain in left knee: Secondary | ICD-10-CM | POA: Diagnosis present

## 2023-12-06 DIAGNOSIS — I255 Ischemic cardiomyopathy: Secondary | ICD-10-CM | POA: Diagnosis present

## 2023-12-06 DIAGNOSIS — G8929 Other chronic pain: Secondary | ICD-10-CM | POA: Diagnosis present

## 2023-12-06 DIAGNOSIS — Z21 Asymptomatic human immunodeficiency virus [HIV] infection status: Secondary | ICD-10-CM | POA: Diagnosis present

## 2023-12-06 DIAGNOSIS — N1831 Chronic kidney disease, stage 3a: Secondary | ICD-10-CM

## 2023-12-06 DIAGNOSIS — I252 Old myocardial infarction: Secondary | ICD-10-CM

## 2023-12-06 DIAGNOSIS — Z7982 Long term (current) use of aspirin: Secondary | ICD-10-CM | POA: Diagnosis not present

## 2023-12-06 DIAGNOSIS — D649 Anemia, unspecified: Secondary | ICD-10-CM | POA: Diagnosis not present

## 2023-12-06 DIAGNOSIS — I5022 Chronic systolic (congestive) heart failure: Secondary | ICD-10-CM | POA: Diagnosis not present

## 2023-12-06 DIAGNOSIS — I5082 Biventricular heart failure: Secondary | ICD-10-CM | POA: Diagnosis present

## 2023-12-06 DIAGNOSIS — R252 Cramp and spasm: Secondary | ICD-10-CM | POA: Diagnosis not present

## 2023-12-06 DIAGNOSIS — I63511 Cerebral infarction due to unspecified occlusion or stenosis of right middle cerebral artery: Principal | ICD-10-CM | POA: Diagnosis present

## 2023-12-06 DIAGNOSIS — K5900 Constipation, unspecified: Secondary | ICD-10-CM | POA: Diagnosis present

## 2023-12-06 DIAGNOSIS — I69354 Hemiplegia and hemiparesis following cerebral infarction affecting left non-dominant side: Secondary | ICD-10-CM | POA: Diagnosis not present

## 2023-12-06 DIAGNOSIS — R519 Headache, unspecified: Secondary | ICD-10-CM | POA: Diagnosis not present

## 2023-12-06 DIAGNOSIS — Z7984 Long term (current) use of oral hypoglycemic drugs: Secondary | ICD-10-CM

## 2023-12-06 DIAGNOSIS — R258 Other abnormal involuntary movements: Secondary | ICD-10-CM | POA: Diagnosis present

## 2023-12-06 DIAGNOSIS — I5043 Acute on chronic combined systolic (congestive) and diastolic (congestive) heart failure: Secondary | ICD-10-CM | POA: Diagnosis present

## 2023-12-06 DIAGNOSIS — K219 Gastro-esophageal reflux disease without esophagitis: Secondary | ICD-10-CM | POA: Diagnosis present

## 2023-12-06 DIAGNOSIS — Z79899 Other long term (current) drug therapy: Secondary | ICD-10-CM

## 2023-12-06 DIAGNOSIS — E785 Hyperlipidemia, unspecified: Secondary | ICD-10-CM

## 2023-12-06 DIAGNOSIS — I2510 Atherosclerotic heart disease of native coronary artery without angina pectoris: Secondary | ICD-10-CM | POA: Diagnosis present

## 2023-12-06 DIAGNOSIS — I69392 Facial weakness following cerebral infarction: Secondary | ICD-10-CM

## 2023-12-06 DIAGNOSIS — I69391 Dysphagia following cerebral infarction: Secondary | ICD-10-CM | POA: Diagnosis present

## 2023-12-06 DIAGNOSIS — M79652 Pain in left thigh: Secondary | ICD-10-CM | POA: Diagnosis not present

## 2023-12-06 DIAGNOSIS — Z951 Presence of aortocoronary bypass graft: Secondary | ICD-10-CM | POA: Diagnosis not present

## 2023-12-06 DIAGNOSIS — R112 Nausea with vomiting, unspecified: Secondary | ICD-10-CM | POA: Diagnosis present

## 2023-12-06 DIAGNOSIS — R001 Bradycardia, unspecified: Secondary | ICD-10-CM | POA: Diagnosis present

## 2023-12-06 DIAGNOSIS — M170 Bilateral primary osteoarthritis of knee: Secondary | ICD-10-CM | POA: Diagnosis present

## 2023-12-06 DIAGNOSIS — K5901 Slow transit constipation: Secondary | ICD-10-CM | POA: Diagnosis not present

## 2023-12-06 DIAGNOSIS — R1032 Left lower quadrant pain: Secondary | ICD-10-CM | POA: Diagnosis not present

## 2023-12-06 DIAGNOSIS — M545 Low back pain, unspecified: Secondary | ICD-10-CM | POA: Diagnosis present

## 2023-12-06 LAB — CBC
HCT: 40.9 % (ref 39.0–52.0)
Hemoglobin: 14.3 g/dL (ref 13.0–17.0)
MCH: 28 pg (ref 26.0–34.0)
MCHC: 35 g/dL (ref 30.0–36.0)
MCV: 80.2 fL (ref 80.0–100.0)
Platelets: 151 10*3/uL (ref 150–400)
RBC: 5.1 MIL/uL (ref 4.22–5.81)
RDW: 14.6 % (ref 11.5–15.5)
WBC: 5.6 10*3/uL (ref 4.0–10.5)
nRBC: 0 % (ref 0.0–0.2)

## 2023-12-06 LAB — BASIC METABOLIC PANEL WITH GFR
Anion gap: 10 (ref 5–15)
BUN: 16 mg/dL (ref 8–23)
CO2: 21 mmol/L — ABNORMAL LOW (ref 22–32)
Calcium: 9.2 mg/dL (ref 8.9–10.3)
Chloride: 104 mmol/L (ref 98–111)
Creatinine, Ser: 1.41 mg/dL — ABNORMAL HIGH (ref 0.61–1.24)
GFR, Estimated: 55 mL/min — ABNORMAL LOW (ref >60)
Glucose, Bld: 96 mg/dL (ref 70–99)
Potassium: 4 mmol/L (ref 3.5–5.1)
Sodium: 135 mmol/L (ref 135–145)

## 2023-12-06 MED ORDER — ATORVASTATIN CALCIUM 40 MG PO TABS
40.0000 mg | ORAL_TABLET | Freq: Every day | ORAL | Status: DC
  Administered 2023-12-07 – 2024-01-05 (×30): 40 mg via ORAL
  Filled 2023-12-06 (×30): qty 1

## 2023-12-06 MED ORDER — DOLUTEGRAVIR-LAMIVUDINE 50-300 MG PO TABS
1.0000 | ORAL_TABLET | Freq: Every day | ORAL | Status: DC
  Administered 2023-12-07 – 2024-01-05 (×30): 1 via ORAL
  Filled 2023-12-06 (×30): qty 1

## 2023-12-06 MED ORDER — APIXABAN 5 MG PO TABS: 5.0000 mg | ORAL_TABLET | Freq: Two times a day (BID) | ORAL | Status: DC

## 2023-12-06 MED ORDER — ACETAMINOPHEN 325 MG PO TABS
650.0000 mg | ORAL_TABLET | ORAL | Status: DC | PRN
  Administered 2023-12-06 – 2023-12-27 (×33): 650 mg via ORAL
  Filled 2023-12-06 (×35): qty 2

## 2023-12-06 MED ORDER — EZETIMIBE 10 MG PO TABS
10.0000 mg | ORAL_TABLET | Freq: Every day | ORAL | Status: DC
  Administered 2023-12-07 – 2024-01-05 (×30): 10 mg via ORAL
  Filled 2023-12-06 (×30): qty 1

## 2023-12-06 MED ORDER — FLUTICASONE PROPIONATE 50 MCG/ACT NA SUSP
1.0000 | Freq: Every day | NASAL | Status: DC
  Administered 2023-12-06 – 2024-01-05 (×31): 1 via NASAL
  Filled 2023-12-06: qty 16

## 2023-12-06 MED ORDER — ACETAMINOPHEN 650 MG RE SUPP: 650.0000 mg | RECTAL | Status: DC | PRN

## 2023-12-06 MED ORDER — POLYETHYLENE GLYCOL 3350 17 G PO PACK
17.0000 g | PACK | Freq: Every day | ORAL | Status: DC
  Administered 2023-12-07 – 2023-12-26 (×20): 17 g via ORAL
  Filled 2023-12-06 (×19): qty 1

## 2023-12-06 MED ORDER — ASPIRIN 81 MG PO TBEC
81.0000 mg | DELAYED_RELEASE_TABLET | Freq: Every day | ORAL | Status: DC
  Administered 2023-12-07 – 2024-01-05 (×30): 81 mg via ORAL
  Filled 2023-12-06 (×30): qty 1

## 2023-12-06 MED ORDER — ACETAMINOPHEN 160 MG/5ML PO SOLN: 650.0000 mg | ORAL | Status: DC | PRN

## 2023-12-06 MED ORDER — SENNOSIDES-DOCUSATE SODIUM 8.6-50 MG PO TABS
1.0000 | ORAL_TABLET | Freq: Every evening | ORAL | Status: DC | PRN
  Administered 2023-12-07: 1 via ORAL
  Filled 2023-12-06: qty 1

## 2023-12-06 MED ORDER — APIXABAN 5 MG PO TABS
5.0000 mg | ORAL_TABLET | Freq: Two times a day (BID) | ORAL | Status: DC
  Administered 2023-12-06 – 2024-01-05 (×60): 5 mg via ORAL
  Filled 2023-12-06 (×60): qty 1

## 2023-12-06 MED ORDER — METOPROLOL SUCCINATE ER 25 MG PO TB24
12.5000 mg | ORAL_TABLET | Freq: Every day | ORAL | Status: DC
  Administered 2023-12-09 – 2023-12-13 (×5): 12.5 mg via ORAL
  Filled 2023-12-06 (×8): qty 1

## 2023-12-06 NOTE — Progress Notes (Signed)
 Patient ID: Richard [name], male   DOB: [birth year], 68 y.o.   MRN: [id number] Met with the patient to review current medical situation, rehab process, team conference and plan of care. Reviewed previous stroke 2008 and defibrillator placement; fired once since insertion in 2016.  Discussed secondary risk management including A-fib/CAD on DOAC (ASA + Eliquis) and HTN, HLD on Zetia /Lipitor and dietary modification recommendations. Patient reporting swallowing difficulty; diet changed to D2 nectar until SLP re eval.  Continue to follow along to address educational needs to facilitate preparation for discharge. [name]

## 2023-12-06 NOTE — Progress Notes (Signed)
 INPATIENT REHABILITATION ADMISSION NOTE   Arrival Method: Bed     Mental Orientation: Oriented X4   Assessment: done   Skin: assessed   IV'S: present on bilateral AC   Pain: none   Tubes and Drains: Condom cath in place   Safety Measures: reviewed with pt   Vital Signs: done   Height and Weight: done   Rehab Orientation: done   Family: not at bedside at this time    Notes: done  Starlyn Economy, RN

## 2023-12-06 NOTE — Progress Notes (Signed)
 PHARMACY - ANTICOAGULATION CONSULT NOTE  Pharmacy Consult for apixaban Indication:  Stroke  Allergies  Allergen Reactions   Lisinopril  Cough   Ioxaglate Hives   Ivp Dye [Iodinated Contrast Media] Hives    Patient Measurements: Height: 5\' 10"  (177.8 cm) Weight: 78.6 kg (173 lb 4.5 oz) IBW/kg (Calculated) : 73 HEPARIN  DW (KG): 78.6  Vital Signs: Temp: 99.3 F (37.4 C) (04/29 0811) Temp Source: Oral (04/29 0811) BP: 95/74 (04/29 0811) Pulse Rate: 76 (04/29 0811)  Labs: Recent Labs    12/04/23 0742 12/05/23 0622 12/06/23 0534  HGB 13.2 14.0 14.3  HCT 38.6* 41.1 40.9  PLT 116* 144* 151  CREATININE 1.40* 1.44* 1.41*    Estimated Creatinine Clearance: 53.2 mL/min (A) (by C-G formula based on SCr of 1.41 mg/dL (H)).   Medical History: Past Medical History:  Diagnosis Date   Coronary artery disease    a. cath 04/02/2014 3v dx 80-90% in D1, 80-90% mid LAD, 95% apical LAD, 95% distal inferior LV branch of LCx, total occlusion of prox RCA  b). Cath 04/03/2014 DES to mid LAD, DES x3 to prox D1, DAPT indefinitely. LCx lesion untreated   GERD (gastroesophageal reflux disease)    HIV (human immunodeficiency virus infection) (HCC)    Hypertension    Ischemic cardiomyopathy 08/09/2013   EF 20-25 percent by echo in 04/2014, s/p [employer] Fortify Assura model [id number] serial number [id number]   Stroke [facility])     Medications:  Medications Prior to Admission  Medication Sig Dispense Refill Last Dose/Taking   aspirin  81 MG chewable tablet Chew 81 mg by mouth daily.   12/01/2023 at  8:00 AM   atorvastatin  (LIPITOR) 40 MG tablet Take 1 tablet by mouth once daily (Patient taking differently: Take 40 mg by mouth at bedtime.) 90 tablet 3 11/30/2023   cetirizine (ZYRTEC) 10 MG tablet Take 10 mg by mouth daily.   Past Week   diphenhydrAMINE  (BENADRYL  ALLERGY) 25 mg capsule Take 25 mg by mouth every 6 (six) hours as needed for itching.   Past Month   dolutegravir -lamiVUDine  (DOVATO ) 50-300 MG  tablet Take 1 tablet by mouth daily.   11/30/2023   DULoxetine (CYMBALTA) 30 MG capsule Take 30 mg by mouth in the morning and at bedtime.   12/01/2023 Morning   empagliflozin  (JARDIANCE ) 25 MG TABS tablet Take 12.5 mg by mouth in the morning.   11/30/2023   ezetimibe  (ZETIA ) 10 MG tablet Take 1 tablet (10 mg total) by mouth daily. 90 tablet 2 11/30/2023   fluticasone (FLONASE) 50 MCG/ACT nasal spray Place 1 spray into both nostrils daily.   11/30/2023   furosemide  (LASIX ) 20 MG tablet Take 20 mg by mouth daily as needed for fluid or edema.   Unknown   omeprazole (PRILOSEC) 40 MG capsule Take 40 mg by mouth 2 (two) times daily.   11/30/2023   oxybutynin (DITROPAN) 5 MG tablet Take 5 mg by mouth in the morning and at bedtime.   Past Week   REFRESH LIQUIGEL 1 % GEL Place 2 drops into both eyes 4 (four) times daily.   11/30/2023   valACYclovir (VALTREX) 1000 MG tablet Take 1,000 mg by mouth 2 (two) times daily as needed (as directed for outbreaks).   Unknown   empagliflozin  (JARDIANCE ) 10 MG TABS tablet Take 1 tablet (10 mg total) by mouth daily. (Patient not taking: Reported on 12/02/2023) 30 tablet 5 Not Taking   pantoprazole  (PROTONIX ) 40 MG tablet Take 1 tablet (40 mg total) by mouth daily  as needed. (Patient not taking: Reported on 12/02/2023) 30 tablet 0 Not Taking    Assessment: 12 YOM admitted with a CVA needing apixaban therapy Goal of Therapy:  Monitor platelets by anticoagulation protocol: Yes   Plan:  At 22:00 on 4/29; start apixaban 5 mg po bid Monitor for signs of bleeding  Thank you for the consult  Marleta Simmer BS, PharmD, BCPS Clinical Pharmacist 12/06/2023 12:40 PM  Contact: [phone] after 3 PM  "Be curious, not judgmental..." -Rumalda Counter

## 2023-12-06 NOTE — H&P (Signed)
 Physical Medicine and Rehabilitation Admission H&P    Chief Complaint  Patient presents with   Code Stroke  : HPI: Richard [name] is a 68 year old right-handed male with history significant for HIV maintained on Dovato , CKD stage IIIa with baseline creatinine 1.3-1.5, CAD/MI/CABG 2015 followed by Dr. 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  Per chart review patient lives with spouse.  1 level home 2 steps to enter.  Independent prior to admission.  Presented 12/01/2023 with left-sided weakness and dysarthria of unclear duration.  CT of the head showed subtle hypoattenuation involving the right caudate and right lentiform nuclei.  No acute intracranial hemorrhage.  Remote infarct involving the right frontal operculum, insula and superior right temporal lobe.  CTA of head and neck showed occlusion of the right M1 segment as well as proximal M2 branch of the right MCA and intraluminal thrombus noted.  Reconstitution of multiple M2 branches of the right MCA.  Several M2 inferior division branches demonstrated diminished intraluminal contrast.  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  Reconstitution of the supraclinoid left ICA as well as left ACA and left MCA via the circle of Willis.  Admission chemistries unremarkable except creatinine 1.33, alcohol negative, SARS coronavirus negative.  Patient underwent revascularization of right MCA occlusion per interventional radiology.  MRI follow-up revealed moderate-sized acute infarct in the right MCA distribution.  Echocardiogram with ejection fraction of 20 to 25%.  The left ventricle demonstrating global hypokinesis.  Grade 1 diastolic dysfunction.  Neurology follow-up patient initially maintained on low-dose aspirin  81 mg daily and Plavix  75 mg daily for  CVA prophylaxis and transition to Eliquis and continue Plavix  with aspirin  discontinued.  Cardiology services continues to follow for acute on chronic biventricular systolic heart failure his Toprol  was resumed there was some difficulty in tapering off pressors with bouts of hypotension.    Patient currently on a mechanical soft diet.  Therapy evaluations completed due to patient decreased functional mobility left-sided weakness and dysarthria was admitted for a comprehensive rehab program.  Pt reports his LBM 4 days ago- but doesn't feel constipated- OK if doesn't go tonight, but would want tomorrow if doesn't go tonight.  Voiding OK Very dysarthric per pt.  Having HA's daily- 3-4/10- tylenol  helps a lot, but frustrating to have.   Said coughed with thin liquids, is OK with nectar thick liquids for now until retested.    Review of Systems  Constitutional:  Negative for chills and fever.  HENT:  Negative for hearing loss.   Eyes:  Negative for blurred vision and double vision.  Respiratory:  Negative for cough, shortness of breath and wheezing.   Cardiovascular:  Positive for leg swelling. Negative for chest pain and palpitations.  Gastrointestinal:  Positive for constipation. Negative for heartburn, nausea and vomiting.       GERD  Genitourinary:  Negative for dysuria, flank pain and hematuria.  Musculoskeletal:  Positive for joint pain and myalgias.  Skin:  Negative for rash.  Neurological:  Positive for dizziness, speech change and weakness.  All other systems reviewed and are negative.  Past Medical History:  Diagnosis Date   Coronary artery disease    a. cath 04/02/2014 3v dx 80-90% in D1, 80-90% mid LAD, 95% apical LAD, 95% distal inferior LV branch of LCx, total occlusion of prox RCA  b). Cath 04/03/2014  DES to mid LAD, DES x3 to prox D1, DAPT indefinitely. LCx lesion untreated   GERD (gastroesophageal reflux disease)    HIV (human immunodeficiency virus infection) (HCC)     Hypertension    Ischemic cardiomyopathy 08/09/2013   EF 20-25 percent by echo in 04/2014, s/p [employer] model [id number] serial number [id number]   Stroke [facility])    Past Surgical History:  Procedure Laterality Date   CARDIAC CATHETERIZATION  04/02/2014   DR Loetta Ringer   ESOPHAGOGASTRODUODENOSCOPY (EGD) WITH PROPOFOL  N/A 09/08/2018   Procedure: ESOPHAGOGASTRODUODENOSCOPY (EGD) WITH PROPOFOL ;  Surgeon: Alvis Jourdain, MD;  Location: [facility] ENDOSCOPY;  Service: Endoscopy;  Laterality: N/A;   IMPLANTABLE CARDIOVERTER DEFIBRILLATOR IMPLANT N/A 09/10/2014   Procedure: IMPLANTABLE CARDIOVERTER DEFIBRILLATOR IMPLANT;  Surgeon: Luana Rumple, MD;  [facility] model [id number] serial number [id number]   IR CT HEAD LTD  12/01/2023   IR PERCUTANEOUS ART THROMBECTOMY/INFUSION INTRACRANIAL INC DIAG ANGIO  12/01/2023   IR US  GUIDE VASC ACCESS RIGHT  12/01/2023   LEFT AND RIGHT HEART CATHETERIZATION WITH CORONARY ANGIOGRAM N/A 04/02/2014   Procedure: LEFT AND RIGHT HEART CATHETERIZATION WITH CORONARY ANGIOGRAM;  Surgeon: Millicent Ally, MD;  Location: [facility] CATH LAB;  Service: Cardiovascular;  Laterality: N/A;   MEDIAL COLLATERAL LIGAMENT REPAIR, KNEE Right 08/25/2015   Procedure: RIGHT THUMB RADICAL COLLATERAL LIGAMENT REPAIR;  Surgeon: Florida Hurter, MD;  Location: [facility] OR;  Service: Orthopedics;  Laterality: Right;   PERCUTANEOUS CORONARY STENT INTERVENTION (PCI-S) N/A 04/03/2014   Procedure: PERCUTANEOUS CORONARY STENT INTERVENTION (PCI-S);  Surgeon: Peter M Swaziland, MD;  Location: [facility] CATH LAB;  Service: Cardiovascular;  Laterality: N/A;   RADIOLOGY WITH ANESTHESIA N/A 12/01/2023   Procedure: RADIOLOGY WITH ANESTHESIA;  Surgeon: Radiologist, Medication, MD;  Location: [facility] OR;  Service: Radiology;  Laterality: N/A;  Juana Nones DILATION N/A 09/08/2018   Procedure: SAVORY DILATION;  Surgeon: Alvis Jourdain, MD;  Location: [facility] ENDOSCOPY;  Service: Endoscopy;  Laterality: N/A;   Family History  Problem  Relation Age of Onset   Heart disease Mother    Esophageal cancer Neg Hx    Colon cancer Neg Hx    Social History:  reports that he has never smoked. He has never used smokeless tobacco. He reports that he does not drink alcohol and does not use drugs. Allergies:  Allergies  Allergen Reactions   Lisinopril  Cough   Ioxaglate Hives   Ivp Dye [Iodinated Contrast Media] Hives   Medications Prior to Admission  Medication Sig Dispense Refill   aspirin  81 MG chewable tablet Chew 81 mg by mouth daily.     atorvastatin  (LIPITOR) 40 MG tablet Take 1 tablet by mouth once daily (Patient taking differently: Take 40 mg by mouth at bedtime.) 90 tablet 3   cetirizine (ZYRTEC) 10 MG tablet Take 10 mg by mouth daily.     diphenhydrAMINE  (BENADRYL  ALLERGY) 25 mg capsule Take 25 mg by mouth every 6 (six) hours as needed for itching.     dolutegravir -lamiVUDine  (DOVATO ) 50-300 MG tablet Take 1 tablet by mouth daily.     DULoxetine (CYMBALTA) 30 MG capsule Take 30 mg by mouth in the morning and at bedtime.     empagliflozin  (JARDIANCE ) 25 MG TABS tablet Take 12.5 mg by mouth in the morning.     ezetimibe  (ZETIA ) 10 MG tablet Take 1 tablet (10 mg total) by mouth daily. 90 tablet 2   fluticasone (FLONASE) 50 MCG/ACT nasal spray Place 1 spray into both nostrils daily.  furosemide  (LASIX ) 20 MG tablet Take 20 mg by mouth daily as needed for fluid or edema.     omeprazole (PRILOSEC) 40 MG capsule Take 40 mg by mouth 2 (two) times daily.     oxybutynin (DITROPAN) 5 MG tablet Take 5 mg by mouth in the morning and at bedtime.     REFRESH LIQUIGEL 1 % GEL Place 2 drops into both eyes 4 (four) times daily.     valACYclovir (VALTREX) 1000 MG tablet Take 1,000 mg by mouth 2 (two) times daily as needed (as directed for outbreaks).     empagliflozin  (JARDIANCE ) 10 MG TABS tablet Take 1 tablet (10 mg total) by mouth daily. (Patient not taking: Reported on 12/02/2023) 30 tablet 5   pantoprazole  (PROTONIX ) 40 MG tablet  Take 1 tablet (40 mg total) by mouth daily as needed. (Patient not taking: Reported on 12/02/2023) 30 tablet 0      Home: Home Living Family/patient expects to be discharged to:: Private residence Living Arrangements: Spouse/significant other Available Help at Discharge: Family, Available 24 hours/day Type of Home: House Home Access: Stairs to enter [employer] of Steps: 2 Entrance Stairs-Rails: Right Home Layout: One level Bathroom Shower/Tub: Associate Professor: Yes Home Equipment: None   Functional History: Prior Function Prior Level of Function : Independent/Modified Independent, Driving Mobility Comments: independent ADLs Comments: independent, likes sports  Functional Status:  Mobility: Bed Mobility Overal bed mobility: Needs Assistance Bed Mobility: Rolling, Sidelying to Sit Rolling: Mod assist Sidelying to sit: Mod assist, HOB elevated, Used rails Sit to supine: Max assist, +2 for physical assistance, +2 for safety/equipment General bed mobility comments: assist with LLE and trunk Transfers Overall transfer level: Needs assistance Equipment used: Ambulation equipment used Transfers: Sit to/from Stand, Bed to chair/wheelchair/BSC Sit to Stand: Mod assist, +2 physical assistance, +2 safety/equipment Bed to/from chair/wheelchair/BSC transfer type:: Via Lift equipment Transfer via Lift Equipment: Stedy General transfer comment: STS in stedy with +2 mod assist. Heavy L lean. Stedy transfer bed to recliner. Ambulation/Gait General Gait Details: deferred    ADL: ADL Overall ADL's : Needs assistance/impaired Eating/Feeding: Moderate assistance, Sitting Grooming: Wash/dry hands, Wash/dry face, Set up, Sitting Grooming Details (indicate cue type and reason): using R hand only, max A to be held up in sitting by PT Upper Body Bathing: Moderate assistance, Sitting Lower Body Bathing: Maximal assistance, Total  assistance, Sit to/from stand, Sitting/lateral leans Upper Body Dressing : Moderate assistance, Sitting Lower Body Dressing: Maximal assistance, Total assistance, Sit to/from stand, Sitting/lateral leans Toilet Transfer: Moderate assistance, +2 for physical assistance, +2 for safety/equipment, Stand-pivot, BSC/3in1 Toilet Transfer Details (indicate cue type and reason): simulated with stedy Toileting- Clothing Manipulation and Hygiene: Total assistance, +2 for physical assistance, +2 for safety/equipment, Sit to/from stand, Sitting/lateral lean Functional mobility during ADLs: Moderate assistance, +2 for physical assistance, +2 for safety/equipment, Cueing for safety, Cueing for sequencing General ADL Comments: Patient session focus on co-treat with PT in order to address goals and functional deficits. Patient with improved L neglect, and is able to turn L and identify numbers on OT's gloved fingers with improved accuracy. Patient is mod A for bed mobility when rolling to the L, and CGA when holding on footboard with R hand. Patient requiring up to max A to maintain sitting balance when using R hand to complete ADLs. OT and PT using stedy to come into standing, requiring mod A of 2 to come into standing, with heavy L lateral lean in stedy frame. Patient  them completing 2 sit<>stands from chair using handles of BSC to promote weight bearing in LUE and LLE. OT continues to highly recommend intensive rehab >3 hours. OT will continue to follow.  Cognition: Cognition Overall Cognitive Status:  (further assessment needed) Arousal/Alertness: Awake/alert Orientation Level: Oriented X4 Year: 2025 Month: April Attention: Focused Focused Attention: Impaired Focused Attention Impairment: Functional basic (L inattention/neglect) Memory: Appears intact Awareness: Appears intact Problem Solving: Appears intact (further assessment needed) Cognition Arousal: Alert Behavior During Therapy: WFL for tasks  assessed/performed Overall Cognitive Status:  (further assessment needed)  Physical Exam: Blood pressure 95/74, pulse 76, temperature 99.3 F (37.4 C), temperature source Oral, resp. rate 20, height 5\' 10"  (1.778 m), weight 78.6 kg, SpO2 97%. Physical Exam Vitals and nursing note reviewed.  Constitutional:      Appearance: Normal appearance. He is obese.     Comments: Pt sitting up in bed; after eating; awake, alert, appropriate, quiet, NAD  HENT:     Head: Normocephalic and atraumatic.     Comments: L facial droop L tongue deviation Needed cues to look L, but when did, initially turned head, not eyes; finally did have L gaze with a lot of cues    Right Ear: External ear normal.     Left Ear: External ear normal.     Nose: Nose normal. No congestion.     Mouth/Throat:     Mouth: Mucous membranes are dry.     Pharynx: Oropharynx is clear.     Comments: Pocketing food- 2 peas came out of his mouth intact 30 + minutes after eating; nectar thick liquids at bedside Eyes:     General:        Right eye: No discharge.        Left eye: No discharge.  Cardiovascular:     Rate and Rhythm: Normal rate and regular rhythm.     Heart sounds: Normal heart sounds. No murmur heard.    No gallop.  Pulmonary:     Effort: Pulmonary effort is normal. No respiratory distress.     Breath sounds: Normal breath sounds. No wheezing, rhonchi or rales.  Abdominal:     General: There is distension.     Palpations: Abdomen is soft.     Tenderness: There is no abdominal tenderness.  Musculoskeletal:     Cervical back: Neck supple. No tenderness.     Comments: RUE and RLE 5/5 LUE- deltoid and biceps 1/5 otherwise 0/5 LLE- 1/5 HF, KE and PF only- otherwise 0/5  Skin:    General: Skin is warm and dry.     Comments: B/L AC fossa IV's- look OK   Neurological:     Mental Status: He is alert and oriented to person, place, and time.     Comments: Pt's Ox3, intact naming, repetition; reasoning, just  significant dysarthria Hoffman's LUE, no clonus; no increased tone Mild decrease to light touch on L side  Psychiatric:        Mood and Affect: Mood normal.        Behavior: Behavior normal.     Comments: Slightly sad affect     Results for orders placed or performed during the hospital encounter of 12/01/23 (from the past 48 hours)  Basic metabolic panel     Status: Abnormal   Collection Time: 12/05/23  6:22 AM  Result Value Ref Range   Sodium 138 135 - 145 mmol/L   Potassium 4.0 3.5 - 5.1 mmol/L   Chloride 103 98 - 111  mmol/L   CO2 24 22 - 32 mmol/L   Glucose, Bld 87 70 - 99 mg/dL    Comment: Glucose reference range applies only to samples taken after fasting for at least 8 hours.   BUN 12 8 - 23 mg/dL   Creatinine, Ser 1.61 (H) 0.61 - 1.24 mg/dL   Calcium  9.5 8.9 - 10.3 mg/dL   GFR, Estimated 54 (L) >60 mL/min    Comment: (NOTE) Calculated using the CKD-EPI Creatinine Equation (2021)    Anion gap 11 5 - 15    Comment: Performed at [facility], [address] [place], [place] [address]  CBC     Status: Abnormal   Collection Time: 12/05/23  6:22 AM  Result Value Ref Range   WBC 5.2 4.0 - 10.5 K/uL   RBC 5.02 4.22 - 5.81 MIL/uL   Hemoglobin 14.0 13.0 - 17.0 g/dL   HCT 54.0 98.1 - 19.1 %   MCV 81.9 80.0 - 100.0 fL   MCH 27.9 26.0 - 34.0 pg   MCHC 34.1 30.0 - 36.0 g/dL   RDW 47.8 29.5 - 62.1 %   Platelets 144 (L) 150 - 400 K/uL   nRBC 0.0 0.0 - 0.2 %    Comment: Performed at [facility], [address], [place], [place] [address]  Glucose, capillary     Status: Abnormal   Collection Time: 12/05/23 12:02 PM  Result Value Ref Range   Glucose-Capillary 111 (H) 70 - 99 mg/dL    Comment: Glucose reference range applies only to samples taken after fasting for at least 8 hours.   Comment 1 Notify RN   Basic metabolic panel     Status: Abnormal   Collection Time: 12/06/23  5:34 AM  Result Value Ref Range   Sodium 135 135 - 145 mmol/L   Potassium 4.0 3.5 - 5.1  mmol/L   Chloride 104 98 - 111 mmol/L   CO2 21 (L) 22 - 32 mmol/L   Glucose, Bld 96 70 - 99 mg/dL    Comment: Glucose reference range applies only to samples taken after fasting for at least 8 hours.   BUN 16 8 - 23 mg/dL   Creatinine, Ser 7.84 (H) 0.61 - 1.24 mg/dL   Calcium  9.2 8.9 - 10.3 mg/dL   GFR, Estimated 55 (L) >60 mL/min    Comment: (NOTE) Calculated using the CKD-EPI Creatinine Equation (2021)    Anion gap 10 5 - 15    Comment: Performed at [facility], [address], [place], [place] [address]  CBC     Status: None   Collection Time: 12/06/23  5:34 AM  Result Value Ref Range   WBC 5.6 4.0 - 10.5 K/uL   RBC 5.10 4.22 - 5.81 MIL/uL   Hemoglobin 14.3 13.0 - 17.0 g/dL   HCT 52.8 41.3 - 24.4 %   MCV 80.2 80.0 - 100.0 fL   MCH 28.0 26.0 - 34.0 pg   MCHC 35.0 30.0 - 36.0 g/dL   RDW 01.0 27.2 - 53.6 %   Platelets 151 150 - 400 K/uL   nRBC 0.0 0.0 - 0.2 %    Comment: Performed at [facility], [address], [place], [place] [address]   No results found.    Blood pressure 95/74, pulse 76, temperature 99.3 F (37.4 C), temperature source Oral, resp. rate 20, height 5\' 10"  (1.778 m), weight 78.6 kg, SpO2 97%.  Medical Problem List and Plan: 1.  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  -patient may  shower  -ELOS/Goals: 14-18 days Min A Admit to CIR for PT, OT and SLP 2.  Antithrombotics: -DVT/anticoagulation: Eliquis  -antiplatelet therapy: Plavix  75 mg daily 3. Pain Management: Tylenol  as needed 4. Mood/Behavior/Sleep: Provide emotional support  -antipsychotic agents: N/A 5. Neuropsych/cognition: This patient is capable of making decisions on his own behalf. 6. Skin/Wound Care: Routine skin checks 7. Fluids/Electrolytes/Nutrition: Routine in and outs with follow-up chemistries 8.  Acute on chronic biventricular systolic heart failure due  to severe ischemic cardiomyopathy.  Followed by heart failure team.  Monitor for any signs of fluid overload.  Toprol  12.5 mg daily.  Monitor for any hypotension 9.  CAD/MI/CABG.  Follow-up cardiology services.  Currently maintained on aspirin  and Plavix  10.  VT.  Last episode noted 2023 with successful ATP. 11.  CKD stage IIIA=  Baseline creatinine 1.3-1.5.  Follow-up chemistries 12.  HIV.  Continue Dovato  13.  Hyperlipidemia.  Lipitor/Zetia  14.  GERD.  Pepcid  15.  Constipation.  MiraLAX daily, Senokot as needed- no BM in 4 days- wants intervention tomorrow if doesn't go 16.  Dysphagia.  Patient notes coughing on current diet of mechanical soft.  Will  change to Dysphagia 2 and add nectar liquids and have speech therapy follow-up. 17. Daily HA's since stroke- tylenol  is working- gets up to 3-4/10- might need topamax for prevention   Sterling Eisenmenger, PA-C 12/06/2023   I have personally performed a face to face diagnostic evaluation of this patient and formulated the key components of the plan.  Additionally, I have personally reviewed laboratory data, imaging studies, as well as relevant notes and concur with the physician assistant's documentation above.   The patient's status has not changed from the original H&P.  Any changes in documentation from the acute care chart have been noted above.

## 2023-12-06 NOTE — Progress Notes (Signed)
 Inpatient Rehabilitation Admission Medication Review by a Pharmacist  A complete drug regimen review was completed for this patient to identify any potential clinically significant medication issues.  High Risk Drug Classes Is patient taking? Indication by Medication  Antipsychotic No   Anticoagulant Yes Apixaban- stroke  Antibiotic No   Opioid No   Antiplatelet Yes Aspirin - cva ppx  Hypoglycemics/insulin No   Vasoactive Medication Yes Toprol  XL- HTN  Chemotherapy No   Other Yes Lipitor- HLD Dovato - HIV Zetia - HLD     Type of Medication Issue Identified Description of Issue Recommendation(s)  Drug Interaction(s) (clinically significant)     Duplicate Therapy     Allergy     No Medication Administration End Date     Incorrect Dose     Additional Drug Therapy Needed     Significant med changes from prior encounter (inform family/care partners about these prior to discharge).    Other       Clinically significant medication issues were identified that warrant physician communication and completion of prescribed/recommended actions by midnight of the next day:  No   Time spent performing this drug regimen review (minutes):  30   Lajoyce Tamura BS, PharmD, BCPS Clinical Pharmacist 12/06/2023 1:20 PM  Contact: [phone] after 3 PM  "Be curious, not judgmental..." -Rumalda Counter

## 2023-12-06 NOTE — Discharge Instructions (Addendum)
 Inpatient Rehab Discharge Instructions  Richard [name] Discharge date and time: No discharge date for patient encounter.   Activities/Precautions/ Functional Status: Activity: activity as tolerated Diet: soft Wound Care: routine skin checks Functional status:  ___ No restrictions     ___ Walk up steps independently ___ 24/7 supervision/assistance   ___ Walk up steps with assistance ___ Intermittent supervision/assistance  ___ Bathe/dress independently ___ Walk with walker     _x__ Bathe/dress with assistance ___ Walk Independently    ___ Shower independently ___ Walk with assistance    ___ Shower with assistance ___ No alcohol     ___ Return to work/school ________  Special Instructions: No driving Smoking or SmokingSTROKE/TIA DISCHARGE INSTRUCTIONS SMOKING Cigarette smoking nearly doubles your risk of having a stroke & is the single most alterable risk factor  If you smoke or have smoked in the last 12 months, you are advised to quit smoking for your health. Most of the excess cardiovascular risk related to smoking disappears within a year of stopping. Ask you doctor about anti-smoking medications [place] Quit Line: 1-800-QUIT NOW Free Smoking Cessation Classes [phone]  CHOLESTEROL Know your levels; limit fat & cholesterol in your diet  Lipid Panel     Component Value Date/Time   CHOL 82 12/02/2023 1717   CHOL 82 (L) 06/02/2021 0834   TRIG 53 12/02/2023 1717   HDL 28 (L) 12/02/2023 1717   HDL 33 (L) 06/02/2021 0834   CHOLHDL 2.9 12/02/2023 1717   VLDL 11 12/02/2023 1717   LDLCALC 43 12/02/2023 1717   LDLCALC 37 06/02/2021 0834     Many patients benefit from treatment even if their cholesterol is at goal. Goal: Total Cholesterol (CHOL) less than 160 Goal:  Triglycerides (TRIG) less than 150 Goal:  HDL greater than 40 Goal:  LDL (LDLCALC) less than 100   BLOOD PRESSURE American Stroke Association blood pressure target is less that 120/80 mm/Hg  Your discharge blood  pressure is:    Monitor your blood pressure Limit your salt and alcohol intake Many individuals will require more than one medication for high blood pressure  DIABETES (A1c is a blood sugar average for last 3 months) Goal HGBA1c is under 7% (HBGA1c is blood sugar average for last 3 months)  Diabetes: No known diagnosis of diabetes    Lab Results  Component Value Date   HGBA1C 5.6 12/01/2023    Your HGBA1c can be lowered with medications, healthy diet, and exercise. Check your blood sugar as directed by your physician Call your physician if you experience unexplained or low blood sugars.  PHYSICAL ACTIVITY/REHABILITATION Goal is 30 minutes at least 4 days per week  Activity: Increase activity slowly, Therapies: Physical Therapy: Home Health Return to work:  Activity decreases your risk of heart attack and stroke and makes your heart stronger.  It helps control your weight and blood pressure; helps you relax and can improve your mood. Participate in a regular exercise program. Talk with your doctor about the best form of exercise for you (dancing, walking, swimming, cycling).  DIET/WEIGHT Goal is to maintain a healthy weight  Your discharge diet is:  Diet Order             DIET DYS 3 Room service appropriate? Yes with Assist; Fluid consistency: Thin  Diet effective now                   liquids Your height is:    Your current weight is:   Your  Body Mass Index (BMI) is:    Following the type of diet specifically designed for you will help prevent another stroke. Your goal weight range is:   Your goal Body Mass Index (BMI) is 19-24. Healthy food habits can help reduce 3 risk factors for stroke:  High cholesterol, hypertension, and excess weight.  RESOURCES Stroke/Support Group:  Call [phone]   STROKE EDUCATION PROVIDED/REVIEWED AND GIVEN TO PATIENT Stroke warning signs and symptoms How to activate emergency medical system (call 911). Medications prescribed at  discharge. Need for follow-up after discharge. Personal risk factors for stroke. Pneumonia vaccine given: No Flu vaccine given: No My questions have been answered, the writing is legible, and I understand these instructions.  I will adhere to these goals & educational materials that have been provided to me after my discharge from the hospital.      My questions have been answered and I understand these instructions. I will adhere to these goals and the provided educational materials after my discharge from the hospital.  Patient/Caregiver Signature _______________________________ Date __________  Clinician Signature _______________________________________ Date __________  Please bring this form and your medication list with you to all your follow-up doctor's appointments.    _______________________________________________________________________________________________________________  Information on my medicine - ELIQUIS  (apixaban )  This medication education was reviewed with me or my healthcare representative as part of my discharge preparation.    Why was Eliquis  prescribed for you? Eliquis  was prescribed for you to reduce the risk of a blood clot forming that can cause a stroke, due to your recent cardioembolic stroke.   What do You need to know about Eliquis  ? Take your Eliquis  TWICE DAILY - one tablet in the morning and one tablet in the evening with or without food. If you have difficulty swallowing the tablet whole please discuss with your pharmacist how to take the medication safely.  Take Eliquis  exactly as prescribed by your doctor and DO NOT stop taking Eliquis  without talking to the doctor who prescribed the medication.  Stopping may increase your risk of developing a stroke.  Refill your prescription before you run out.  After discharge, you should have regular check-up appointments with your healthcare provider that is prescribing your Eliquis .  In the future  your dose may need to be changed if your kidney function or weight changes by a significant amount or as you get older.  What do you do if you miss a dose? If you miss a dose, take it as soon as you remember on the same day and resume taking twice daily.  Do not take more than one dose of ELIQUIS  at the same time to make up a missed dose.  Important Safety Information A possible side effect of Eliquis  is bleeding. You should call your healthcare provider right away if you experience any of the following: Bleeding from an injury or your nose that does not stop. Unusual colored urine (red or dark brown) or unusual colored stools (red or black). Unusual bruising for unknown reasons. A serious fall or if you hit your head (even if there is no bleeding).  Some medicines may interact with Eliquis  and might increase your risk of bleeding or clotting while on Eliquis . To help avoid this, consult your healthcare provider or pharmacist prior to using any new prescription or non-prescription medications, including herbals, vitamins, non-steroidal anti-inflammatory drugs (NSAIDs) and supplements.  This website has more information on Eliquis  (apixaban ): [email or web address] .[email or web address] Romaine Closs

## 2023-12-06 NOTE — TOC Transition Note (Signed)
 Transition of Care [facility] [place]) - Discharge Note   Patient Details  Name: Richard [name] MRN: [id number] Date of Birth: [birth year]  [facility] of [facility]) CM/SW Contact:  Jonathan Neighbor, RN Phone Number: 12/06/2023, 10:00 AM   Clinical Narrative:     Pt is discharging to CIR today. CM signing off.  Final next level of care: IP Rehab Facility Barriers to Discharge: No Barriers Identified   Patient Goals and CMS Choice   CMS Medicare.gov Compare Post Acute Care list provided to:: Patient Choice offered to / list presented to : Patient      Discharge Placement                       Discharge Plan and Services Additional resources added to the After Visit Summary for                                       Social Drivers of Health (SDOH) Interventions SDOH Screenings   Food Insecurity: No Food Insecurity (12/02/2023)  Housing: Unknown (12/02/2023)  Transportation Needs: No Transportation Needs (12/02/2023)  Utilities: Not At Risk (12/02/2023)  Social Connections: Unknown (12/02/2023)  Tobacco Use: Medium Risk (08/16/2023)   Received from [facility]     Readmission Risk Interventions     No data to display

## 2023-12-06 NOTE — Plan of Care (Signed)
  Problem: Education: Goal: Knowledge of disease or condition will improve Outcome: Progressing Goal: Knowledge of secondary prevention will improve (MUST DOCUMENT ALL) Outcome: Progressing Goal: Knowledge of patient specific risk factors will improve (DELETE if not current risk factor) Outcome: Progressing   Problem: Self-Care: Goal: Ability to participate in self-care as condition permits will improve Outcome: Progressing Goal: Verbalization of feelings and concerns over difficulty with self-care will improve Outcome: Progressing Goal: Ability to communicate needs accurately will improve Outcome: Progressing   Problem: Nutrition: Goal: Risk of aspiration will decrease Outcome: Progressing Goal: Dietary intake will improve Outcome: Progressing   Problem: Activity: Goal: Ability to return to baseline activity level will improve Outcome: Progressing
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
## 2023-12-06 NOTE — Progress Notes (Signed)
 Inpatient Rehabilitation Center Individual Statement of Services  Patient Name:  Richard [name]  Date:  12/06/2023  Welcome to the Inpatient Rehabilitation Center.  Our goal is to provide you with an individualized program based on your diagnosis and situation, designed to meet your specific needs.  With this comprehensive rehabilitation program, you will be expected to participate in at least 3 hours of rehabilitation therapies Monday-Friday, with modified therapy programming on the weekends.  Your rehabilitation program will include the following services:  Physical Therapy (PT), Occupational Therapy (OT), Speech Therapy (ST), 24 hour per day rehabilitation nursing, Therapeutic Recreaction (TR), Neuropsychology, Care Coordinator, Rehabilitation Medicine, Nutrition Services, and Pharmacy Services  Weekly team conferences will be held on Wednesday to discuss your progress.  Your Inpatient Rehabilitation Care Coordinator will talk with you frequently to get your input and to update you on team discussions.  Team conferences with you and your family in attendance may also be held.  Expected length of stay: 3-4 weeks  Overall anticipated outcome: min-mod level of assist  Depending on your progress and recovery, your program may change. Your Inpatient Rehabilitation Care Coordinator will coordinate services and will keep you informed of any changes. Your Inpatient Rehabilitation Care Coordinator's name and contact numbers are listed  below.  The following services may also be recommended but are not provided by the Inpatient Rehabilitation Center:  Driving Evaluations Home Health Rehabiltiation Services Outpatient Rehabilitation Services    Arrangements will be made to provide these services after discharge if needed.  Arrangements include referral to agencies that provide these services.  Your insurance has been verified to be:  [employer] Your primary doctor is:  Chiropractor  Pertinent information will be shared with your doctor and your insurance company.  Inpatient Rehabilitation Care Coordinator:  Adrianna Albee, [place] [phone] or Justine Oms  Information discussed with and copy given to patient by: Mardell Shade, 12/06/2023, 3:03 PM

## 2023-12-06 NOTE — H&P (Signed)
 Physical Medicine and Rehabilitation Admission H&P        Chief Complaint  Patient presents with   Code Stroke  : HPI: Richard [name] is a 68 year old right-handed male with history significant for HIV maintained on Dovato , CKD stage IIIa with baseline creatinine 1.3-1.5, CAD/MI/CABG 2015 followed by Dr. 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  Per chart review patient lives with spouse.  1 level home 2 steps to enter.  Independent prior to admission.  Presented 12/01/2023 with left-sided weakness and dysarthria of unclear duration.  CT of the head showed subtle hypoattenuation involving the right caudate and right lentiform nuclei.  No acute intracranial hemorrhage.  Remote infarct involving the right frontal operculum, insula and superior right temporal lobe.  CTA of head and neck showed occlusion of the right M1 segment as well as proximal M2 branch of the right MCA and intraluminal thrombus noted.  Reconstitution of multiple M2 branches of the right MCA.  Several M2 inferior division branches demonstrated diminished intraluminal contrast.  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  Reconstitution of the supraclinoid left ICA as well as left ACA and left MCA via the circle of Willis.  Admission chemistries unremarkable except creatinine 1.33, alcohol negative, SARS coronavirus negative.  Patient underwent revascularization of right MCA occlusion per interventional radiology.  MRI follow-up revealed moderate-sized acute infarct in the right MCA distribution.  Echocardiogram with ejection fraction of 20 to 25%.  The left ventricle demonstrating global hypokinesis.  Grade 1 diastolic dysfunction.  Neurology follow-up patient initially maintained on low-dose aspirin  81 mg daily and Plavix  75 mg daily  for CVA prophylaxis and transition to Eliquis and continue Plavix  with aspirin  discontinued.  Cardiology services continues to follow for acute on chronic biventricular systolic heart failure his Toprol  was resumed there was some difficulty in tapering off pressors with bouts of hypotension.    Patient currently on a mechanical soft diet.  Therapy evaluations completed due to patient decreased functional mobility left-sided weakness and dysarthria was admitted for a comprehensive rehab program.   Pt reports his LBM 4 days ago- but doesn't feel constipated- OK if doesn't go tonight, but would want tomorrow if doesn't go tonight.  Voiding OK Very dysarthric per pt.  Having HA's daily- 3-4/10- tylenol  helps a lot, but frustrating to have.    Said coughed with thin liquids, is OK with nectar thick liquids for now until retested.      Review of Systems  Constitutional:  Negative for chills and fever.  HENT:  Negative for hearing loss.   Eyes:  Negative for blurred vision and double vision.  Respiratory:  Negative for cough, shortness of breath and wheezing.   Cardiovascular:  Positive for leg swelling. Negative for chest pain and palpitations.  Gastrointestinal:  Positive for constipation. Negative for heartburn, nausea and vomiting.       GERD  Genitourinary:  Negative for dysuria, flank pain and hematuria.  Musculoskeletal:  Positive for joint pain and myalgias.  Skin:  Negative for rash.  Neurological:  Positive for dizziness, speech change and weakness.  All other systems reviewed and are negative.       Past Medical History:  Diagnosis Date   Coronary artery disease      a. cath 04/02/2014 3v dx 80-90% in D1, 80-90% mid LAD, 95% apical LAD, 95% distal  inferior LV branch of LCx, total occlusion of prox RCA  b). Cath 04/03/2014 DES to mid LAD, DES x3 to prox D1, DAPT indefinitely. LCx lesion untreated   GERD (gastroesophageal reflux disease)     HIV (human immunodeficiency virus infection)  (HCC)     Hypertension     Ischemic cardiomyopathy 08/09/2013    EF 20-25 percent by echo in 04/2014, s/p [employer] [place] model [id number] serial number [id number]   Stroke [facility])               Past Surgical History:  Procedure Laterality Date   CARDIAC CATHETERIZATION   04/02/2014    DR Loetta Ringer   ESOPHAGOGASTRODUODENOSCOPY (EGD) WITH PROPOFOL  N/A 09/08/2018    Procedure: ESOPHAGOGASTRODUODENOSCOPY (EGD) WITH PROPOFOL ;  Surgeon: Alvis Jourdain, MD;  Location: [facility] END[facility]COPY;  Service: Endoscopy;  Laterality: N/A;   IMPLANTABLE CARDIOVERTER DEFIBRILLATOR IMPLANT N/A 09/10/2014    Procedure: IMPLANTABLE CARDIOVERTER DEFIBRILLATOR IMPLANT;  Surgeon: Luana Rumple, MD;  [facility] model [id number] serial number [id number]   IR CT HEAD LTD   12/01/2023   IR PERCUTANEOUS ART THROMBECTOMY/INFUSION INTRACRANIAL INC DIAG ANGIO   12/01/2023   IR US  GUIDE VASC ACCESS RIGHT   12/01/2023   LEFT AND RIGHT HEART CATHETERIZATION WITH CORONARY ANGIOGRAM N/A 04/02/2014    Procedure: LEFT AND RIGHT HEART CATHETERIZATION WITH CORONARY ANGIOGRAM;  Surgeon: Millicent Ally, MD;  Location: [facility] CATH LAB;  Service: Cardiovascular;  Laterality: N/A;   MEDIAL COLLATERAL LIGAMENT REPAIR, KNEE Right 08/25/2015    Procedure: RIGHT THUMB RADICAL COLLATERAL LIGAMENT REPAIR;  Surgeon: Florida Hurter, MD;  Location: [facility] OR;  Service: Orthopedics;  Laterality: Right;   PERCUTANEOUS CORONARY STENT INTERVENTION (PCI-S) N/A 04/03/2014    Procedure: PERCUTANEOUS CORONARY STENT INTERVENTION (PCI-S);  Surgeon: Peter M Swaziland, MD;  Location: [facility] CATH LAB;  Service: Cardiovascular;  Laterality: N/A;   RADIOLOGY WITH ANESTHESIA N/A 12/01/2023    Procedure: RADIOLOGY WITH ANESTHESIA;  Surgeon: Radiologist, Medication, MD;  Location: [facility] OR;  Service: Radiology;  Laterality: N/A;  Juana Nones DILATION N/A 09/08/2018    Procedure: SAVORY DILATION;  Surgeon: Alvis Jourdain, MD;  Location: [facility] ENDOSCOPY;  Service: Endoscopy;   Laterality: N/A;             Family History  Problem Relation Age of Onset   Heart disease Mother     Esophageal cancer Neg Hx     Colon cancer Neg Hx          Social History:  reports that he has never smoked. He has never used smokeless tobacco. He reports that he does not drink alcohol and does not use drugs. Allergies:  Allergies      Allergies  Allergen Reactions   Lisinopril  Cough   Ioxaglate Hives   Ivp Dye [Iodinated Contrast Media] Hives            Medications Prior to Admission  Medication Sig Dispense Refill   aspirin  81 MG chewable tablet Chew 81 mg by mouth daily.       atorvastatin  (LIPITOR) 40 MG tablet Take 1 tablet by mouth once daily (Patient taking differently: Take 40 mg by mouth at bedtime.) 90 tablet 3   cetirizine (ZYRTEC) 10 MG tablet Take 10 mg by mouth daily.       diphenhydrAMINE  (BENADRYL  ALLERGY) 25 mg capsule Take 25 mg by mouth every 6 (six) hours as needed for itching.       dolutegravir -lamiVUDine  (DOVATO ) 50-300 MG tablet Take 1 tablet  by mouth daily.       DULoxetine (CYMBALTA) 30 MG capsule Take 30 mg by mouth in the morning and at bedtime.       empagliflozin  (JARDIANCE ) 25 MG TABS tablet Take 12.5 mg by mouth in the morning.       ezetimibe  (ZETIA ) 10 MG tablet Take 1 tablet (10 mg total) by mouth daily. 90 tablet 2   fluticasone (FLONASE) 50 MCG/ACT nasal spray Place 1 spray into both nostrils daily.       furosemide  (LASIX ) 20 MG tablet Take 20 mg by mouth daily as needed for fluid or edema.       omeprazole (PRILOSEC) 40 MG capsule Take 40 mg by mouth 2 (two) times daily.       oxybutynin (DITROPAN) 5 MG tablet Take 5 mg by mouth in the morning and at bedtime.       REFRESH LIQUIGEL 1 % GEL Place 2 drops into both eyes 4 (four) times daily.       valACYclovir (VALTREX) 1000 MG tablet Take 1,000 mg by mouth 2 (two) times daily as needed (as directed for outbreaks).       empagliflozin  (JARDIANCE ) 10 MG TABS tablet Take 1 tablet (10 mg  total) by mouth daily. (Patient not taking: Reported on 12/02/2023) 30 tablet 5   pantoprazole  (PROTONIX ) 40 MG tablet Take 1 tablet (40 mg total) by mouth daily as needed. (Patient not taking: Reported on 12/02/2023) 30 tablet 0              Home: Home Living Family/patient expects to be discharged to:: Private residence Living Arrangements: Spouse/significant other Available Help at Discharge: Family, Available 24 hours/day Type of Home: House Home Access: Stairs to enter [employer] of Steps: 2 Entrance Stairs-Rails: Right Home Layout: One level Bathroom Shower/Tub: Associate Professor: Yes Home Equipment: None   Functional History: Prior Function Prior Level of Function : Independent/Modified Independent, Driving Mobility Comments: independent ADLs Comments: independent, likes sports   Functional Status:  Mobility: Bed Mobility Overal bed mobility: Needs Assistance Bed Mobility: Rolling, Sidelying to Sit Rolling: Mod assist Sidelying to sit: Mod assist, HOB elevated, Used rails Sit to supine: Max assist, +2 for physical assistance, +2 for safety/equipment General bed mobility comments: assist with LLE and trunk Transfers Overall transfer level: Needs assistance Equipment used: Ambulation equipment used Transfers: Sit to/from Stand, Bed to chair/wheelchair/BSC Sit to Stand: Mod assist, +2 physical assistance, +2 safety/equipment Bed to/from chair/wheelchair/BSC transfer type:: Via Lift equipment Transfer via Lift Equipment: Stedy General transfer comment: STS in stedy with +2 mod assist. Heavy L lean. Stedy transfer bed to recliner. Ambulation/Gait General Gait Details: deferred   ADL: ADL Overall ADL's : Needs assistance/impaired Eating/Feeding: Moderate assistance, Sitting Grooming: Wash/dry hands, Wash/dry face, Set up, Sitting Grooming Details (indicate cue type and reason): using R hand only, max A to  be held up in sitting by PT Upper Body Bathing: Moderate assistance, Sitting Lower Body Bathing: Maximal assistance, Total assistance, Sit to/from stand, Sitting/lateral leans Upper Body Dressing : Moderate assistance, Sitting Lower Body Dressing: Maximal assistance, Total assistance, Sit to/from stand, Sitting/lateral leans Toilet Transfer: Moderate assistance, +2 for physical assistance, +2 for safety/equipment, Stand-pivot, BSC/3in1 Toilet Transfer Details (indicate cue type and reason): simulated with stedy Toileting- Clothing Manipulation and Hygiene: Total assistance, +2 for physical assistance, +2 for safety/equipment, Sit to/from stand, Sitting/lateral lean Functional mobility during ADLs: Moderate assistance, +2 for physical assistance, +2 for safety/equipment, Cueing for safety,  Cueing for sequencing General ADL Comments: Patient session focus on co-treat with PT in order to address goals and functional deficits. Patient with improved L neglect, and is able to turn L and identify numbers on OT's gloved fingers with improved accuracy. Patient is mod A for bed mobility when rolling to the L, and CGA when holding on footboard with R hand. Patient requiring up to max A to maintain sitting balance when using R hand to complete ADLs. OT and PT using stedy to come into standing, requiring mod A of 2 to come into standing, with heavy L lateral lean in stedy frame. Patient them completing 2 sit<>stands from chair using handles of BSC to promote weight bearing in LUE and LLE. OT continues to highly recommend intensive rehab >3 hours. OT will continue to follow.   Cognition: Cognition Overall Cognitive Status:  (further assessment needed) Arousal/Alertness: Awake/alert Orientation Level: Oriented X4 Year: 2025 Month: April Attention: Focused Focused Attention: Impaired Focused Attention Impairment: Functional basic (L inattention/neglect) Memory: Appears intact Awareness: Appears intact Problem  Solving: Appears intact (further assessment needed) Cognition Arousal: Alert Behavior During Therapy: WFL for tasks assessed/performed Overall Cognitive Status:  (further assessment needed)   Physical Exam: Blood pressure 95/74, pulse 76, temperature 99.3 F (37.4 C), temperature source Oral, resp. rate 20, height 5\' 10"  (1.778 m), weight 78.6 kg, SpO2 97%. Physical Exam Vitals and nursing note reviewed.  Constitutional:      Appearance: Normal appearance. He is obese.     Comments: Pt sitting up in bed; after eating; awake, alert, appropriate, quiet, NAD  HENT:     Head: Normocephalic and atraumatic.     Comments: L facial droop L tongue deviation Needed cues to look L, but when did, initially turned head, not eyes; finally did have L gaze with a lot of cues    Right Ear: External ear normal.     Left Ear: External ear normal.     Nose: Nose normal. No congestion.     Mouth/Throat:     Mouth: Mucous membranes are dry.     Pharynx: Oropharynx is clear.     Comments: Pocketing food- 2 peas came out of his mouth intact 30 + minutes after eating; nectar thick liquids at bedside Eyes:     General:        Right eye: No discharge.        Left eye: No discharge.  Cardiovascular:     Rate and Rhythm: Normal rate and regular rhythm.     Heart sounds: Normal heart sounds. No murmur heard.    No gallop.  Pulmonary:     Effort: Pulmonary effort is normal. No respiratory distress.     Breath sounds: Normal breath sounds. No wheezing, rhonchi or rales.  Abdominal:     General: There is distension.     Palpations: Abdomen is soft.     Tenderness: There is no abdominal tenderness.  Musculoskeletal:     Cervical back: Neck supple. No tenderness.     Comments: RUE and RLE 5/5 LUE- deltoid and biceps 1/5 otherwise 0/5 LLE- 1/5 HF, KE and PF only- otherwise 0/5  Skin:    General: Skin is warm and dry.     Comments: B/L AC fossa IV's- look OK   Neurological:     Mental Status: He is  alert and oriented to person, place, and time.     Comments: Pt's Ox3, intact naming, repetition; reasoning, just significant dysarthria Hoffman's LUE, no clonus; no  increased tone Mild decrease to light touch on L side  Psychiatric:        Mood and Affect: Mood normal.        Behavior: Behavior normal.     Comments: Slightly sad affect        Lab Results Last 48 Hours        Results for orders placed or performed during the hospital encounter of 12/01/23 (from the past 48 hours)  Basic metabolic panel     Status: Abnormal    Collection Time: 12/05/23  6:22 AM  Result Value Ref Range    Sodium 138 135 - 145 mmol/L    Potassium 4.0 3.5 - 5.1 mmol/L    Chloride 103 98 - 111 mmol/L    CO2 24 22 - 32 mmol/L    Glucose, Bld 87 70 - 99 mg/dL      Comment: Glucose reference range applies only to samples taken after fasting for at least 8 hours.    BUN 12 8 - 23 mg/dL    Creatinine, Ser 5.78 (H) 0.61 - 1.24 mg/dL    Calcium  9.5 8.9 - 10.3 mg/dL    GFR, Estimated 54 (L) >60 mL/min      Comment: (NOTE) Calculated using the CKD-EPI Creatinine Equation (2021)      Anion gap 11 5 - 15      Comment: Performed at [facility], [address] [place], [place] [address]  CBC     Status: Abnormal    Collection Time: 12/05/23  6:22 AM  Result Value Ref Range    WBC 5.2 4.0 - 10.5 K/uL    RBC 5.02 4.22 - 5.81 MIL/uL    Hemoglobin 14.0 13.0 - 17.0 g/dL    HCT 95.2 84.1 - 32.4 %    MCV 81.9 80.0 - 100.0 fL    MCH 27.9 26.0 - 34.0 pg    MCHC 34.1 30.0 - 36.0 g/dL    RDW 40.1 02.7 - 25.3 %    Platelets 144 (L) 150 - 400 K/uL    nRBC 0.0 0.0 - 0.2 %      Comment: Performed at [facility], [address], [place], [place] [address]  Glucose, capillary     Status: Abnormal    Collection Time: 12/05/23 12:02 PM  Result Value Ref Range    Glucose-Capillary 111 (H) 70 - 99 mg/dL      Comment: Glucose reference range applies only to samples taken after fasting for at least 8 hours.     Comment 1 Notify RN    Basic metabolic panel     Status: Abnormal    Collection Time: 12/06/23  5:34 AM  Result Value Ref Range    Sodium 135 135 - 145 mmol/L    Potassium 4.0 3.5 - 5.1 mmol/L    Chloride 104 98 - 111 mmol/L    CO2 21 (L) 22 - 32 mmol/L    Glucose, Bld 96 70 - 99 mg/dL      Comment: Glucose reference range applies only to samples taken after fasting for at least 8 hours.    BUN 16 8 - 23 mg/dL    Creatinine, Ser 3.47 (H) 0.61 - 1.24 mg/dL    Calcium  9.2 8.9 - 10.3 mg/dL    GFR, Estimated 55 (L) >60 mL/min      Comment: (NOTE) Calculated using the CKD-EPI Creatinine Equation (2021)      Anion gap 10 5 -  15      Comment: Performed at [facility], [address] [place], [place] [address]  CBC     Status: None    Collection Time: 12/06/23  5:34 AM  Result Value Ref Range    WBC 5.6 4.0 - 10.5 K/uL    RBC 5.10 4.22 - 5.81 MIL/uL    Hemoglobin 14.3 13.0 - 17.0 g/dL    HCT 19.1 47.8 - 29.5 %    MCV 80.2 80.0 - 100.0 fL    MCH 28.0 26.0 - 34.0 pg    MCHC 35.0 30.0 - 36.0 g/dL    RDW 62.1 30.8 - 65.7 %    Platelets 151 150 - 400 K/uL    nRBC 0.0 0.0 - 0.2 %      Comment: Performed at [facility], [address], [place], [place] [address]      Imaging Results (Last 48 hours)  No results found.         Blood pressure 95/74, pulse 76, temperature 99.3 F (37.4 C), temperature source Oral, resp. rate 20, height 5\' 10"  (1.778 m), weight 78.6 kg, SpO2 97%.   Medical Problem List and Plan: 1. 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             -patient may  shower             -ELOS/Goals: 14-18 days Min A Admit to CIR for PT, OT and SLP 2.  Antithrombotics: -DVT/anticoagulation: Eliquis             -antiplatelet therapy: Plavix  75 mg daily 3. Pain Management: Tylenol  as needed 4. Mood/Behavior/Sleep: Provide emotional support              -antipsychotic agents: N/A 5. Neuropsych/cognition: This patient is capable of making decisions on his own behalf. 6. Skin/Wound Care: Routine skin checks 7. Fluids/Electrolytes/Nutrition: Routine in and outs with follow-up chemistries 8.  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  Followed by heart failure team.  Monitor for any signs of fluid overload.  Toprol  12.5 mg daily.  Monitor for any hypotension 9.  CAD/MI/CABG.  Follow-up cardiology services.  Currently maintained on aspirin  and Plavix  10.  VT.  Last episode noted 2023 with successful ATP. 11.  CKD stage IIIA=  Baseline creatinine 1.3-1.5.  Follow-up chemistries 12.  HIV.  Continue Dovato  13.  Hyperlipidemia.  Lipitor/Zetia  14.  GERD.  Pepcid  15.  Constipation.  MiraLAX daily, Senokot as needed- no BM in 4 days- wants intervention tomorrow if doesn't go 16.  Dysphagia.  Patient notes coughing on current diet of mechanical soft.  Will  change to Dysphagia 2 and add nectar liquids and have speech therapy follow-up. 17. Daily HA's since stroke- tylenol  is working- gets up to 3-4/10- might need topamax for prevention     Sterling Eisenmenger, PA-C 12/06/2023     I have personally performed a face to face diagnostic evaluation of this patient and formulated the key components of the plan.  Additionally, I have personally reviewed laboratory data, imaging studies, as well as relevant notes and concur with the physician assistant's documentation above.   The patient's status has not changed from the original H&P.  Any changes in documentation from the acute care chart have been noted above.

## 2023-12-06 NOTE — Discharge Summary (Addendum)
 Stroke Discharge Summary  Patient ID: Richard [name]   MRN: [id number]      DOB: 06-27-1956  Date of Admission: 12/01/2023 Date of Discharge: 12/06/2023  Attending Physician:  Consuelo Denmark MD Consultant(s):    cardiology and rehabilitation medicine  Patient's PCP:  Richard Batters, MD  DISCHARGE PRIMARY DIAGNOSIS:  Stroke:  right MCA territory infarct with right M1 and M2 occlusion s/p IR with TICI2a, etiology likely due to acute on chronic MCA stenosis vs. Cardioembolic from severe cardiomyopathy with low EF.   Secondary diagnosis Intracranial and extracranial stenosis CHF s/p ICD Severe CAD/MI Hypotension Hx of hypertension HLD Dysphagia Hx of stroke HIV    Allergies as of 12/06/2023       Reactions   Lisinopril  Cough   Ioxaglate Hives   Ivp Dye [iodinated Contrast Media] Hives        Medication List     STOP taking these medications    aspirin  81 MG chewable tablet   omeprazole 40 MG capsule Commonly known as: PRILOSEC   pantoprazole  40 MG tablet Commonly known as: PROTONIX        TAKE these medications    atorvastatin  40 MG tablet Commonly known as: LIPITOR Take 1 tablet by mouth once daily What changed: when to take this   Benadryl  Allergy 25 mg capsule Generic drug: diphenhydrAMINE  Take 25 mg by mouth every 6 (six) hours as needed for itching.   cetirizine 10 MG tablet Commonly known as: ZYRTEC Take 10 mg by mouth daily.   dolutegravir -lamiVUDine  50-300 MG tablet Commonly known as: DOVATO  Take 1 tablet by mouth daily.   DULoxetine 30 MG capsule Commonly known as: CYMBALTA Take 30 mg by mouth in the morning and at bedtime.   ezetimibe  10 MG tablet Commonly known as: ZETIA  Take 1 tablet (10 mg total) by mouth daily.   fluticasone 50 MCG/ACT nasal spray Commonly known as: FLONASE Place 1 spray into both nostrils daily.   furosemide  20 MG tablet Commonly known as: LASIX  Take 20 mg by mouth daily as needed for fluid or  edema.   Jardiance  25 MG Tabs tablet Generic drug: empagliflozin  Take 12.5 mg by mouth in the morning. What changed: Another medication with the same name was removed. Continue taking this medication, and follow the directions you see here.   oxybutynin 5 MG tablet Commonly known as: DITROPAN Take 5 mg by mouth in the morning and at bedtime.   Refresh Liquigel 1 % Gel Generic drug: Carboxymethylcellulose Sodium Place 2 drops into both eyes 4 (four) times daily.   valACYclovir 1000 MG tablet Commonly known as: VALTREX Take 1,000 mg by mouth 2 (two) times daily as needed (as directed for outbreaks).        LABORATORY STUDIES CBC    Component Value Date/Time   WBC 5.6 12/06/2023 0534   RBC 5.10 12/06/2023 0534   HGB 14.3 12/06/2023 0534   HCT 40.9 12/06/2023 0534   PLT 151 12/06/2023 0534   MCV 80.2 12/06/2023 0534   MCH 28.0 12/06/2023 0534   MCHC 35.0 12/06/2023 0534   RDW 14.6 12/06/2023 0534   LYMPHSABS 1.0 12/01/2023 1029   MONOABS 0.4 12/01/2023 1029   EOSABS 0.1 12/01/2023 1029   BASOSABS 0.0 12/01/2023 1029   CMP    Component Value Date/Time   NA 135 12/06/2023 0534   NA 139 02/03/2023 0950   K 4.0 12/06/2023 0534   CL 104 12/06/2023 0534   CO2 21 (L) 12/06/2023  0534   GLUCOSE 96 12/06/2023 0534   BUN 16 12/06/2023 0534   BUN 11 02/03/2023 0950   CREATININE 1.41 (H) 12/06/2023 0534   CREATININE 1.38 (H) 10/01/2016 1547   CALCIUM  9.2 12/06/2023 0534   PROT 7.0 12/02/2023 1717   PROT 7.8 01/17/2020 0905   ALBUMIN 3.6 12/02/2023 1717   ALBUMIN 4.7 01/17/2020 0905   AST 23 12/02/2023 1717   ALT 26 12/02/2023 1717   ALKPHOS 49 12/02/2023 1717   BILITOT 1.0 12/02/2023 1717   BILITOT 0.5 01/17/2020 0905   GFRNONAA 55 (L) 12/06/2023 0534   GFRAA 56 (L) 01/17/2020 0905   COAGS Lab Results  Component Value Date   INR 1.1 12/01/2023   INR 1.07 09/04/2014   INR 1.04 04/02/2014   Lipid Panel    Component Value Date/Time   CHOL 82 12/02/2023 1717    CHOL 82 (L) 06/02/2021 0834   TRIG 53 12/02/2023 1717   HDL 28 (L) 12/02/2023 1717   HDL 33 (L) 06/02/2021 0834   CHOLHDL 2.9 12/02/2023 1717   VLDL 11 12/02/2023 1717   LDLCALC 43 12/02/2023 1717   LDLCALC 37 06/02/2021 0834   HgbA1C  Lab Results  Component Value Date   HGBA1C 5.6 12/01/2023   Alcohol Level    Component Value Date/Time   [facility] <15 12/01/2023 1029     SIGNIFICANT DIAGNOSTIC STUDIES ECHOCARDIOGRAM COMPLETE BUBBLE STUDY Result Date: 12/02/2023    ECHOCARDIOGRAM REPORT   Patient Name:   Richard [name] Date of Exam: 12/02/2023 Medical Rec #:  [id number]           Height:       70.0 in Accession #:    [id number]          Weight:       173.3 lb Date of Birth:  [birth year]           BSA:          1.964 m Patient Age:    68 years            BP:           107/69 mmHg Patient Gender: M                   HR:           85 bpm. Exam Location:  Inpatient Procedure: 2D Echo, Cardiac Doppler and Color Doppler (Both Spectral and Color            Flow Doppler were utilized during procedure). Indications:    Stroke  History:        Patient has prior history of Echocardiogram examinations, most                 recent 03/03/2023. Cardiomyopathy and CHF, CAD, Abnormal ECG and                 Defibrillator, Stroke, Arrythmias:VT, Signs/Symptoms:Altered                 Mental Status; Risk Factors:Hypertension and Dyslipidemia.  Sonographer:    Raynelle Callow RDCS Referring Phys: [id number] [facility] IMPRESSIONS  1. Left ventricular ejection fraction, by estimation, is 20 to 25%. The left ventricle has severely decreased function. The left ventricle demonstrates global hypokinesis. The left ventricular internal cavity size was mildly dilated. There is mild concentric left ventricular hypertrophy. Left ventricular diastolic parameters are consistent with Grade I diastolic dysfunction (impaired relaxation).  2. Right ventricular systolic function is  moderately reduced. The right ventricular size is mildly  enlarged. The estimated right ventricular systolic pressure is 16.5 mmHg.  3. Left atrial size was mildly dilated.  4. The mitral valve is normal in structure. Moderate mitral valve regurgitation. No evidence of mitral stenosis.  5. The aortic valve is tricuspid. There is mild calcification of the aortic valve. Aortic valve regurgitation is not visualized. No aortic stenosis is present.  6. The inferior vena cava is normal in size with greater than 50% respiratory variability, suggesting right atrial pressure of 3 mmHg.  7. Agitated saline contrast bubble study was negative, with no evidence of any interatrial shunt. FINDINGS  Left Ventricle: Left ventricular ejection fraction, by estimation, is 20 to 25%. The left ventricle has severely decreased function. The left ventricle demonstrates global hypokinesis. The left ventricular internal cavity size was mildly dilated. There is mild concentric left ventricular hypertrophy. Left ventricular diastolic parameters are consistent with Grade I diastolic dysfunction (impaired relaxation). Right Ventricle: The right ventricular size is mildly enlarged. No increase in right ventricular wall thickness. Right ventricular systolic function is moderately reduced. The tricuspid regurgitant velocity is 1.84 m/s, and with an assumed right atrial pressure of 3 mmHg, the estimated right ventricular systolic pressure is 16.5 mmHg. Left Atrium: Left atrial size was mildly dilated. Right Atrium: Right atrial size was normal in size. Pericardium: There is no evidence of pericardial effusion. Mitral Valve: The mitral valve is normal in structure. Moderate mitral valve regurgitation. No evidence of mitral valve stenosis. Tricuspid Valve: The tricuspid valve is normal in structure. Tricuspid valve regurgitation is trivial. Aortic Valve: The aortic valve is tricuspid. There is mild calcification of the aortic valve. Aortic valve regurgitation is not visualized. No aortic stenosis is present.  Pulmonic Valve: The pulmonic valve was normal in structure. Pulmonic valve regurgitation is trivial. Aorta: The aortic root is normal in size and structure. Venous: The inferior vena cava is normal in size with greater than 50% respiratory variability, suggesting right atrial pressure of 3 mmHg. IAS/Shunts: No atrial level shunt detected by color flow Doppler. Agitated saline contrast bubble study was negative, with no evidence of any interatrial shunt. Additional Comments: A device lead is visualized in the right ventricle.  LEFT VENTRICLE PLAX 2D LVIDd:         6.20 cm      Diastology LVIDs:         5.70 cm      LV e' medial:    4.24 cm/s LV PW:         1.40 cm      LV E/e' medial:  11.1 LV IVS:        1.20 cm      LV e' lateral:   4.90 cm/s LVOT diam:     2.20 cm      LV E/e' lateral: 9.6 LV SV:         66 LV SV Index:   33 LVOT Area:     3.80 cm  LV Volumes (MOD) LV vol d, MOD A2C: 187.0 ml LV vol d, MOD A4C: 177.0 ml LV vol s, MOD A2C: 115.0 ml LV vol s, MOD A4C: 131.0 ml LV SV MOD A2C:     72.0 ml LV SV MOD A4C:     177.0 ml LV SV MOD BP:      57.6 ml RIGHT VENTRICLE            IVC RV S prime:     6.96 cm/s  IVC diam: 1.10 cm TAPSE (M-mode): 1.2 cm LEFT ATRIUM           Index        RIGHT ATRIUM           Index LA diam:      2.20 cm 1.12 cm/m   RA Area:     10.90 cm LA Vol (A2C): 25.8 ml 13.14 ml/m  RA Volume:   23.40 ml  11.92 ml/m LA Vol (A4C): 37.6 ml 19.15 ml/m  AORTIC VALVE LVOT Vmax:   101.00 cm/s LVOT Vmean:  63.900 cm/s LVOT VTI:    0.173 m  AORTA Ao Root diam: 3.70 cm MITRAL VALVE                TRICUSPID VALVE MV Area (PHT): 3.99 cm     TR Peak grad:   13.5 mmHg MV Decel Time: 190 msec     TR Vmax:        184.00 cm/s MV E velocity: 47.20 cm/s MV A velocity: 111.00 cm/s  SHUNTS MV E/A ratio:  0.43         Systemic VTI:  0.17 m                             Systemic Diam: 2.20 cm Dalton McleanMD Electronically signed by Archer Bear Signature Date/Time: 12/02/2023/2:08:55 PM    Final    MR  BRAIN WO CONTRAST Result Date: 12/02/2023 CLINICAL DATA:  Stroke workup EXAM: MRI HEAD WITHOUT CONTRAST TECHNIQUE: Multiplanar, multiecho pulse sequences of the brain and surrounding structures were obtained without intravenous contrast. COMPARISON:  Head CT and CTA from yesterday FINDINGS: Brain: Patchy acute infarction in the right MCA distribution along the temporal cortex/white matter, posterior insula (which is thin and may have been previously affected by infarct), and posterior striatum/corona radiata. New acute hemorrhage, hydrocephalus, mass, or collection. Vascular: Absent right MCA flow void at the level of the lower insula, noted angiographic findings. Absent left ICA flow void with intracranial normalization. Skull and upper cervical spine: Normal marrow signal Sinuses/Orbits: Negative IMPRESSION: Moderate acute infarction in the right MCA distribution. Slow or absent flow in the right MCA beginning at the lower sylvian fissure. 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 Electronically Signed   By: Ronnette Coke M.D.   On: 12/02/2023 11:26   IR PERCUTANEOUS ART THROMBECTOMY/INFUSION INTRACRANIAL INC DIAG ANGIO Result Date: 12/01/2023 INDICATION: 68 year old male with acute ischemic stroke and symptoms of left-sided weakness, dysphagia, and facial droop. Patient was not a candidate for thrombolytic therapy. Patient qualifies as an extended window stroke and after a multidisciplinary review, interventional therapy was offered. Patient presents for stroke thrombectomy. EXAM: 1. Catheterization of the right common carotid artery with selective arteriogram) 2. Catheterization of the right internal carotid artery with selective arteriogram 3. Suction thrombectomy of right M1 segment. 4. Stent retriever assisted suction thrombectomy of the right M2 and M1 segments 5. Selective catheterization of the right M2 segment with selective arteriogram 6. Cone beam CT for treatment  planning 7. Ultrasound guidance for vascular access COMPARISON:  CT angiogram performed 12/01/2023 MEDICATIONS: Heparin  5000 units intravenously was administered during the procedure. ANESTHESIA/SEDATION: General anesthesia CONTRAST:  80 cc of Omnipaque  300 FLUOROSCOPY TIME:  Fluoroscopy Time: 18.3 minutes (1050 mGy). COMPLICATIONS: None immediate. TECHNIQUE: Following a full explanation of the procedure along with the potential associated complications, an informed witnessed consent was obtained. The risks of intracranial hemorrhage ,  worsening neurological deficit, ventilator dependency, death and inability to revascularize were all reviewed in detail with the patient's wife. The patient was then put under general anesthesia by the Department of Anesthesiology at [facility]. The right groin was prepped and draped in the usual sterile fashion. Ultrasound was used to study the right common femoral artery. The artery is patent. A hard copy image ultrasound was saved and stored in PACs. Using real-time ultrasound guidance, a 21 gauge needle was advanced into the right common femoral artery. Access was obtained at 1122. Using Seldinger technique, a 6 French sheath was placed in the descending thoracic aorta. Next, selective catheterization of the right common carotid artery is performed. A selective arteriogram demonstrated occlusion of the distal M1 segment. There was no significant flow limiting stenosis in the proximal internal carotid artery based on NASCET criteria. Next, the diagnostic catheter was advanced into the right internal carotid artery and a selective arteriogram was performed. This confirmed occlusion of the distal right M1 segment. Next, a penumbra 43 suction catheter with coaxial penumbra 68 catheter were advanced into the distal M1 segment and suction thrombectomy was performed. First pass was performed at 11:37. A follow-up angiogram demonstrated modest improvement with removal of a short  segment of thromboembolic disease in the distal M1 segment. Of note, robust collateralization to the MCA branches was present. Next, I elected to repeat suction thrombectomy with the coaxial penumbra system at 11:47 a.m. A third pass was performed using the penumbra system at 11:55. Given the modest improvement at this point, I elected to perform a pass at 12:03 using EMBO trap device. The M2 segment was selectively catheterized and the EMBO trap device was deployed. Recanalization was not achieved at this point and therefore an additional stent retriever pass was performed at 12:14. Repeat deployment of the EMBO trap devise in the right M2 segment extending into the distal M1 segment was again performed at 12:24. At this point, multiple oblique images were obtained and in the occlusive lesion was profiled. When compared to the initial arteriogram, there was modest improvement in appearance of the distal M1 stump. There was no evidence of abnormal extravasation. There was robust collateral formation to the MCA territory branches which suggest the possibility of some underlying chronic disease (the left ICA is also occluded chronically). Catheters and guidewires were removed at this point. Evaluation of the femoral access site was performed to evaluate for closure device. An Angio-Seal device was deployed without complication. Next, a cone beam CT was performed to evaluate for treatment planning. This demonstrated residual contrast agent within the vascular system. Note were the were areas of atherosclerotic plaque within the distal right M1 segment. There was opacification of the right M2 branches. There was no evidence abnormal extravasation to indicate significant intracranial hemorrhage. The patient was then removed from general anesthesia and transferred to the recovery area in stable condition. FINDINGS: 1. Occlusive disease within the distal right M1 segment with robust collateralization. Pretreatment TICI  score 2A 2. Approximately 6 suction and stent retriever passes were performed in the right M2 segment/distal M1 segment with modest improvement of the distal right M1 stump. The angiographic appearance suggests some underlying chronic arterial occlusive disease. 3. Post treatment TICI score 2A PROCEDURE: Detailed above. IMPRESSION: 1. Endovascular thrombectomy of right M1 occlusive disease with modest improvement in flow. 2. Intra procedural CT scan demonstrated no evidence of significant intracranial hemorrhage. PLAN: 1. To PACU in stable condition. 2. Postoperative BP parameters discussed with the neurology  team (permissive HTN, goal 130-180). 3. Please call with questions or concerns. Electronically Signed   By: Reagan Camera M.D.   On: 12/01/2023 14:39   IR CT Head Ltd Result Date: 12/01/2023 INDICATION: 68 year old male with acute ischemic stroke and symptoms of left-sided weakness, dysphagia, and facial droop. Patient was not a candidate for thrombolytic therapy. Patient qualifies as an extended window stroke and after a multidisciplinary review, interventional therapy was offered. Patient presents for stroke thrombectomy. EXAM: 1. Catheterization of the right common carotid artery with selective arteriogram) 2. Catheterization of the right internal carotid artery with selective arteriogram 3. Suction thrombectomy of right M1 segment. 4. Stent retriever assisted suction thrombectomy of the right M2 and M1 segments 5. Selective catheterization of the right M2 segment with selective arteriogram 6. Cone beam CT for treatment planning 7. Ultrasound guidance for vascular access COMPARISON:  CT angiogram performed 12/01/2023 MEDICATIONS: Heparin  5000 units intravenously was administered during the procedure. ANESTHESIA/SEDATION: General anesthesia CONTRAST:  80 cc of Omnipaque  300 FLUOROSCOPY TIME:  Fluoroscopy Time: 18.3 minutes (1050 mGy). COMPLICATIONS: None immediate. TECHNIQUE: Following a full explanation of  the procedure along with the potential associated complications, an informed witnessed consent was obtained. The risks of intracranial hemorrhage , worsening neurological deficit, ventilator dependency, death and inability to revascularize were all reviewed in detail with the patient's wife. The patient was then put under general anesthesia by the Department of Anesthesiology at [facility]. The right groin was prepped and draped in the usual sterile fashion. Ultrasound was used to study the right common femoral artery. The artery is patent. A hard copy image ultrasound was saved and stored in PACs. Using real-time ultrasound guidance, a 21 gauge needle was advanced into the right common femoral artery. Access was obtained at 1122. Using Seldinger technique, a 6 French sheath was placed in the descending thoracic aorta. Next, selective catheterization of the right common carotid artery is performed. A selective arteriogram demonstrated occlusion of the distal M1 segment. There was no significant flow limiting stenosis in the proximal internal carotid artery based on NASCET criteria. Next, the diagnostic catheter was advanced into the right internal carotid artery and a selective arteriogram was performed. This confirmed occlusion of the distal right M1 segment. Next, a penumbra 43 suction catheter with coaxial penumbra 68 catheter were advanced into the distal M1 segment and suction thrombectomy was performed. First pass was performed at 11:37. A follow-up angiogram demonstrated modest improvement with removal of a short segment of thromboembolic disease in the distal M1 segment. Of note, robust collateralization to the MCA branches was present. Next, I elected to repeat suction thrombectomy with the coaxial penumbra system at 11:47 a.m. A third pass was performed using the penumbra system at 11:55. Given the modest improvement at this point, I elected to perform a pass at 12:03 using EMBO trap device. The  M2 segment was selectively catheterized and the EMBO trap device was deployed. Recanalization was not achieved at this point and therefore an additional stent retriever pass was performed at 12:14. Repeat deployment of the EMBO trap devise in the right M2 segment extending into the distal M1 segment was again performed at 12:24. At this point, multiple oblique images were obtained and in the occlusive lesion was profiled. When compared to the initial arteriogram, there was modest improvement in appearance of the distal M1 stump. There was no evidence of abnormal extravasation. There was robust collateral formation to the MCA territory branches which suggest the possibility of some underlying  chronic disease (the left ICA is also occluded chronically). Catheters and guidewires were removed at this point. Evaluation of the femoral access site was performed to evaluate for closure device. An Angio-Seal device was deployed without complication. Next, a cone beam CT was performed to evaluate for treatment planning. This demonstrated residual contrast agent within the vascular system. Note were the were areas of atherosclerotic plaque within the distal right M1 segment. There was opacification of the right M2 branches. There was no evidence abnormal extravasation to indicate significant intracranial hemorrhage. The patient was then removed from general anesthesia and transferred to the recovery area in stable condition. FINDINGS: 1. Occlusive disease within the distal right M1 segment with robust collateralization. Pretreatment TICI score 2A 2. Approximately 6 suction and stent retriever passes were performed in the right M2 segment/distal M1 segment with modest improvement of the distal right M1 stump. The angiographic appearance suggests some underlying chronic arterial occlusive disease. 3. Post treatment TICI score 2A PROCEDURE: Detailed above. IMPRESSION: 1. Endovascular thrombectomy of right M1 occlusive disease  with modest improvement in flow. 2. Intra procedural CT scan demonstrated no evidence of significant intracranial hemorrhage. PLAN: 1. To PACU in stable condition. 2. Postoperative BP parameters discussed with the neurology team (permissive HTN, goal 130-180). 3. Please call with questions or concerns. Electronically Signed   By: Reagan Camera M.D.   On: 12/01/2023 14:39   IR US  Guide Vasc Access Right Result Date: 12/01/2023 INDICATION: 68 year old male with acute ischemic stroke and symptoms of left-sided weakness, dysphagia, and facial droop. Patient was not a candidate for thrombolytic therapy. Patient qualifies as an extended window stroke and after a multidisciplinary review, interventional therapy was offered. Patient presents for stroke thrombectomy. EXAM: 1. Catheterization of the right common carotid artery with selective arteriogram) 2. Catheterization of the right internal carotid artery with selective arteriogram 3. Suction thrombectomy of right M1 segment. 4. Stent retriever assisted suction thrombectomy of the right M2 and M1 segments 5. Selective catheterization of the right M2 segment with selective arteriogram 6. Cone beam CT for treatment planning 7. Ultrasound guidance for vascular access COMPARISON:  CT angiogram performed 12/01/2023 MEDICATIONS: Heparin  5000 units intravenously was administered during the procedure. ANESTHESIA/SEDATION: General anesthesia CONTRAST:  80 cc of Omnipaque  300 FLUOROSCOPY TIME:  Fluoroscopy Time: 18.3 minutes (1050 mGy). COMPLICATIONS: None immediate. TECHNIQUE: Following a full explanation of the procedure along with the potential associated complications, an informed witnessed consent was obtained. The risks of intracranial hemorrhage , worsening neurological deficit, ventilator dependency, death and inability to revascularize were all reviewed in detail with the patient's wife. The patient was then put under general anesthesia by the Department of  Anesthesiology at [facility]. The right groin was prepped and draped in the usual sterile fashion. Ultrasound was used to study the right common femoral artery. The artery is patent. A hard copy image ultrasound was saved and stored in PACs. Using real-time ultrasound guidance, a 21 gauge needle was advanced into the right common femoral artery. Access was obtained at 1122. Using Seldinger technique, a 6 French sheath was placed in the descending thoracic aorta. Next, selective catheterization of the right common carotid artery is performed. A selective arteriogram demonstrated occlusion of the distal M1 segment. There was no significant flow limiting stenosis in the proximal internal carotid artery based on NASCET criteria. Next, the diagnostic catheter was advanced into the right internal carotid artery and a selective arteriogram was performed. This confirmed occlusion of the distal right M1 segment. Next,  a penumbra 43 suction catheter with coaxial penumbra 68 catheter were advanced into the distal M1 segment and suction thrombectomy was performed. First pass was performed at 11:37. A follow-up angiogram demonstrated modest improvement with removal of a short segment of thromboembolic disease in the distal M1 segment. Of note, robust collateralization to the MCA branches was present. Next, I elected to repeat suction thrombectomy with the coaxial penumbra system at 11:47 a.m. A third pass was performed using the penumbra system at 11:55. Given the modest improvement at this point, I elected to perform a pass at 12:03 using EMBO trap device. The M2 segment was selectively catheterized and the EMBO trap device was deployed. Recanalization was not achieved at this point and therefore an additional stent retriever pass was performed at 12:14. Repeat deployment of the EMBO trap devise in the right M2 segment extending into the distal M1 segment was again performed at 12:24. At this point, multiple oblique  images were obtained and in the occlusive lesion was profiled. When compared to the initial arteriogram, there was modest improvement in appearance of the distal M1 stump. There was no evidence of abnormal extravasation. There was robust collateral formation to the MCA territory branches which suggest the possibility of some underlying chronic disease (the left ICA is also occluded chronically). Catheters and guidewires were removed at this point. Evaluation of the femoral access site was performed to evaluate for closure device. An Angio-Seal device was deployed without complication. Next, a cone beam CT was performed to evaluate for treatment planning. This demonstrated residual contrast agent within the vascular system. Note were the were areas of atherosclerotic plaque within the distal right M1 segment. There was opacification of the right M2 branches. There was no evidence abnormal extravasation to indicate significant intracranial hemorrhage. The patient was then removed from general anesthesia and transferred to the recovery area in stable condition. FINDINGS: 1. Occlusive disease within the distal right M1 segment with robust collateralization. Pretreatment TICI score 2A 2. Approximately 6 suction and stent retriever passes were performed in the right M2 segment/distal M1 segment with modest improvement of the distal right M1 stump. The angiographic appearance suggests some underlying chronic arterial occlusive disease. 3. Post treatment TICI score 2A PROCEDURE: Detailed above. IMPRESSION: 1. Endovascular thrombectomy of right M1 occlusive disease with modest improvement in flow. 2. Intra procedural CT scan demonstrated no evidence of significant intracranial hemorrhage. PLAN: 1. To PACU in stable condition. 2. Postoperative BP parameters discussed with the neurology team (permissive HTN, goal 130-180). 3. Please call with questions or concerns. Electronically Signed   By: Reagan Camera M.D.   On: 12/01/2023  14:39   CT ANGIO HEAD NECK W WO CM W PERF (CODE STROKE) Result Date: 12/01/2023 CLINICAL DATA:  Code stroke, neuro deficit, left-sided weakness, left sensory deficit, left face weakness and or dysarthria. EXAM: CT ANGIOGRAPHY HEAD AND NECK CT PERFUSION BRAIN TECHNIQUE: Multidetector CT imaging of the head and neck was performed using the standard protocol during bolus administration of intravenous contrast. Multiplanar CT image reconstructions and MIPs were obtained to evaluate the vascular anatomy. Carotid stenosis measurements (when applicable) are obtained utilizing NASCET criteria, using the distal internal carotid diameter as the denominator. Multiphase CT imaging of the brain was performed following IV bolus contrast injection. Subsequent parametric perfusion maps were calculated using RAPID software. RADIATION DOSE REDUCTION: This exam was performed according to the departmental dose-optimization program which includes automated exposure control, adjustment of the mA and/or kV according to patient size and/or use  of iterative reconstruction technique. CONTRAST:  OMNIPAQUE  IOHEXOL  350 MG/ML SOLN COMPARISON:  Same-day head CT. FINDINGS: CTA NECK FINDINGS Aortic arch: Common origin of the brachiocephalic and left common carotid arteries. Imaged portion shows no evidence of aneurysm or dissection. No significant stenosis of the major arch vessel origins. Pulmonary arteries: As permitted by contrast timing, there are no filling defects in the visualized pulmonary arteries. Subclavian arteries: The subclavian arteries are patent bilaterally. Right carotid system: No evidence of dissection, stenosis (50% or greater), or occlusion. Retropharyngeal course of the proximal cervical ICA. Left carotid system: Patent from the origin to the carotid bifurcation. Abrupt occlusion of the proximal cervical ICA. External carotid artery branches are patent. Vertebral arteries: Codominant. No evidence of dissection,  stenosis (50% or greater), or occlusion. Skeleton: No acute or aggressive findings. Mild degenerative changes in the cervical spine. Additional is maxilla and mandible. Dental implants noted. Other neck: The visualized airway is patent. No cervical lymphadenopathy. Upper chest: Partially visualized left chest wall pacer device. Partially visualized right pleural effusion. Review of the MIP images confirms the above findings CTA HEAD FINDINGS ANTERIOR CIRCULATION: The left ICA is occluded to the level of the supraclinoid segment. There is reconstitution of the supraclinoid left ICA with diminished intraluminal contrast. Moderate narrowing of the left ICA just proximal to the ICA terminus. Reconstitution of the left ACA and left MCA likely via the circle-of-Willis. The right ICA is patent from the skull base to the ICA terminus. Mild atherosclerosis of the right carotid siphon without significant stenosis. MCAs: The right M1 segment is occluded proximally with intraluminal soft tissue concerning for thrombus. There is also occlusion of a proximal M2 branch of the right MCA with likely intraluminal thrombus. Reconstitution of M2 superior division branches. There is additional reconstitution of multiple M2 inferior division branches some of which demonstrate diminished intraluminal contrast. ACAs: The anterior cerebral arteries are patent bilaterally. POSTERIOR CIRCULATION: No significant stenosis, proximal occlusion, aneurysm, or vascular malformation. PCAs: The posterior cerebral arteries are patent bilaterally. Pcomm: The posterior communicating arteries are visualized bilaterally. SCAs: The superior cerebellar arteries are patent bilaterally. Basilar artery: Patent AICAs: Patent PICAs: Patent Vertebral arteries: The intracranial vertebral arteries are patent. Venous sinuses: As permitted by contrast timing, patent. Anatomic variants: None Review of the MIP images confirms the above findings CT Brain Perfusion  Findings: ASPECTS: 8 CBF (<30%) Volume: 0mL Perfusion (Tmax>6.0s) volume: Mismatch Volume: Infarction Location:No core infarct identified. Elevated T-max throughout the right MCA territory concerning for ischemic changes. IMPRESSION: Occlusion of the right M1 segment as well as a proximal M2 branch of the right MCA with intraluminal thrombus noted. Reconstitution of multiple M2 branches of the right MCA. Several M2 inferior division branches demonstrate diminished intraluminal contrast. Diffuse region of elevated T-max throughout the right MCA territory concerning for ischemic changes without evidence of core infarct. 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 Reconstitution of the supraclinoid left ICA as well as the left ACA and left MCA via the circle-of-Willis. Small right pleural effusion. 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 Doretta Gant At 10:57 am on 12/01/2023 by text page via the [facility] messaging system. Electronically Signed   By: Denny Flack M.D.   On: 12/01/2023 11:09   CT HEAD CODE STROKE WO CONTRAST Result Date: 12/01/2023 CLINICAL DATA:  Code stroke.  Neuro deficit, concern for stroke. EXAM:  CT HEAD WITHOUT CONTRAST TECHNIQUE: Contiguous axial images were obtained from the base of the skull through the vertex without intravenous contrast. RADIATION DOSE REDUCTION: This exam was performed according to the departmental dose-optimization program which includes automated exposure control, adjustment of the mA and/or kV according to patient size and/or use of iterative reconstruction technique. COMPARISON:  MRI head 04/02/2007. FINDINGS: Brain: No acute intracranial hemorrhage. Encephalomalacia involving the right frontal operculum and superior right temporal lobe as well as the right insula compatible with prior infarct. There is subtle  hypoattenuation in the right caudate nucleus and right lentiform nuclei which may reflect acute infarct. No significant edema or midline shift. The basilar cisterns are patent. The posterior fossa is unremarkable. Ventricles: The ventricles are normal. Vascular: Asymmetric dense appearance of the right M1 segment and possibly involving an M2 branch of the right MCA. Atherosclerosis of the carotid siphons. Skull: No acute or aggressive finding. Orbits: Orbits are symmetric. Sinuses: Mild mucosal thickening in the ethmoid sinuses, sphenoid sinuses, and partially visualized right maxillary sinus. Mucosal thickening with near complete opacification of the left frontal sinus. Other: Mastoid air cells are clear. ASPECTS [facility] Stroke Program Early CT Score) - Ganglionic level infarction (caudate, lentiform nuclei, internal capsule, insula, M1-M3 cortex): 5 - Supraganglionic infarction (M4-M6 cortex): 3 Total score (0-10 with 10 being normal): 8 IMPRESSION: 1. Subtle hypoattenuation involving the right caudate and right lentiform nuclei which may reflect acute infarct. 2. No acute intracranial hemorrhage. 3. Remote infarct involving the right frontal operculum, insula, and superior right temporal lobe. 4. Asymmetric density of the right M1 segment. Recommend correlation with CTA. 5. ASPECTS is 8 These results were communicated to Dr. Doretta Gant At 10:50 am on 12/01/2023 by text page via the [facility] messaging system. Electronically Signed   By: Denny Flack M.D.   On: 12/01/2023 10:50       HISTORY OF PRESENT ILLNESS 68 y.o. male with history of stroke, HIV infection, CAD, MI, ischemic cardiomyopathy, CHF, ICD, V. tach and hyperlipidemia admitted for acute onset dysarthria, left-sided weakness and left facial droop.  He was found to have right MCA occlusion and was taken to IR for mechanical thrombectomy with  TICI 2a revascularization achieved.  MRI reveals acute infarct in the right MCA distribution.  NIH on Admission 11    Code Stroke CT head hypoattenuation involving right caudate and right lentiform nuclei possibly reflecting acute infarct, asymmetric density of right M1 segment ASPECTS 8 CTA head & neck occlusion of right M1 segment as well as proximal M2 branch of the right MCA with intraluminal thrombus noted.  Chronic left ICA occlusion, reconstitution of the supraclinoid left ICA as well as the left ACA and left MCA near the circle of Willis. CT perfusion 0 mL core and 143 mL penumbra in right MCA territory MRI moderate acute infarct in right MCA distribution 2D Echo EF 20 to 25% LDL 37 HgbA1c 5.6 VTE prophylaxis - Lovenox  aspirin  81 mg daily prior to admission, continue ASA 81 due to astherosclerosis and add eliquis  Therapy recommendations:  CIR Disposition: Discharge to CIR after [facility] approval   Intracranial and extracranial stenosis/occlusion CTA head & neck occlusion of right M1 segment as well as proximal M2 branch of the right MCA with intraluminal thrombus noted.   CTA also showed Chronic left ICA occlusion, reconstitution of the supraclinoid left ICA as well as the left ACA and left MCA near the circle of Willis. Per Dr. Laverta Potters IR note - Acute on chronic occlusive disease,  right M1 and M2, with stroke  Continue ASA, start eliquis 4/29   CHF s/p ICD Severe CAD/MI Patient has history of CHF status post ICD implantation (2015) He has a single-chamber defibrillator which would not be reliable to detect atrial fibrillation.  Echo EF 20 to 25% Restarted Toprol  XL 12.5 mg daily Would not resume SGLT2 until followed up outpatient  Previous off spiro d/t hypotension On eliquis now per cardiology recommendation   History of hypertension Now hypotensive Home meds: Metoprolol  XL 12.5 mg daily Stable on the low end off norepinephrine  On metoprolol  XL 12.5 daily now Long term BP goal normotensive   Hyperlipidemia Home meds: Atorvastatin  40 mg daily and ezetimibe  10 mg daily LDL 37, goal <  70 Home lipitor and Zetia  resumed Continue statin and Zetia  at discharge   Dysphagia Patient has post-stroke dysphagia SLP consulted On dysphagia 3 and thin liquid Advance diet as tolerated   Other stroke risk factors Advanced age Patient has a history of a stroke in 2008 with no residual deficits   Other Active Problems HIV infection - continue home Dovato    DISCHARGE EXAM  General:  Alert, well-nourished, well-developed patient in no acute distress Psych:  Mood and affect appropriate for situation CV: Regular rate and rhythm on monitor Respiratory:  Regular, unlabored respirations on supplemental O2     NEURO:  Mental Status: AA&Ox3, patient is able to give some history Speech/Language: Mild dysarthria, no aphasia   Cranial Nerves:  II: PERRL. Left hemianopia.  III, IV, VI: EOMI. Eyelids elevate symmetrically.  V: Sensation is intact to light touch and symmetrical to face.  VII: Left facial droop VIII: hearing intact to voice. IX, X: Voice is mildly dysarthric, no hoarseness.  XII: tongue is midline without fasciculations. Motor:  RUE/RLE: 5/5, no drift LUE: 0/5, no movement, no grip strength LLE:0/5, no movement, unable to wiggle toes.  Tone: is normal and bulk is normal Sensation- Intact to light touch bilaterally. Extinction to DSS present on the left Coordination: FTN intact on the right Gait- deferred   Discharge Diet       Diet   DIET DYS 3 Room service appropriate? Yes with Assist; Fluid consistency: Thin   liquids  DISCHARGE PLAN Disposition: Rehab aspirin  81 mg daily and Eliquis (apixaban) daily for secondary stroke prevention Ongoing stroke risk factor control by Primary Care Physician at time of discharge Follow-up PCP Bakare, Mobolaji B, MD in 2 weeks. Follow-up in [facility] in 8 weeks, office to schedule an appointment. Able to see NP in clinic.  35 minutes were spent preparing discharge.  Patient seen  and examined by NP/APP with MD. MD to update note as needed.   Imogene Mana, DNP, FNP-BC [facility] Pager: [phone]  ATTENDING NOTE: I reviewed above note and agree with the assessment and plan. Pt was seen and examined.   No acute event overnight. Neuro unchanged. BP still on the low end, on low dose metoprolol . Cardiology signed off. Add eliquis today, d/c plavix , continue ASA and statin and zetia . Pt is ready for CIR placement. Follow up at [facility].  For detailed assessment and plan, please refer to above as I have made changes wherever appropriate.   Consuelo Denmark, MD PhD Stroke Neurology 12/06/2023 1:15 PM

## 2023-12-06 NOTE — Progress Notes (Signed)
 Inpatient Rehab Admissions Coordinator:   I have a CIR bed for this pt. RN may call report to [phone].  Pt. To admit to CIR for estimated 12-14 days with the goal of discharging home with his wife.   Wandalee Gust, MS, CCC-SLP Rehab Admissions Coordinator  [phone] (celll) [phone] (office)

## 2023-12-07 DIAGNOSIS — I69391 Dysphagia following cerebral infarction: Secondary | ICD-10-CM

## 2023-12-07 DIAGNOSIS — B20 Human immunodeficiency virus [HIV] disease: Secondary | ICD-10-CM | POA: Diagnosis not present

## 2023-12-07 DIAGNOSIS — I63511 Cerebral infarction due to unspecified occlusion or stenosis of right middle cerebral artery: Secondary | ICD-10-CM | POA: Diagnosis not present

## 2023-12-07 DIAGNOSIS — I255 Ischemic cardiomyopathy: Secondary | ICD-10-CM

## 2023-12-07 LAB — CBC WITH DIFFERENTIAL/PLATELET
Abs Immature Granulocytes: 0.02 10*3/uL (ref 0.00–0.07)
Basophils Absolute: 0 10*3/uL (ref 0.0–0.1)
Basophils Relative: 1 %
Eosinophils Absolute: 0.3 10*3/uL (ref 0.0–0.5)
Eosinophils Relative: 5 %
HCT: 42.8 % (ref 39.0–52.0)
Hemoglobin: 14.8 g/dL (ref 13.0–17.0)
Immature Granulocytes: 0 %
Lymphocytes Relative: 40 %
Lymphs Abs: 2.2 10*3/uL (ref 0.7–4.0)
MCH: 28 pg (ref 26.0–34.0)
MCHC: 34.6 g/dL (ref 30.0–36.0)
MCV: 80.9 fL (ref 80.0–100.0)
Monocytes Absolute: 0.7 10*3/uL (ref 0.1–1.0)
Monocytes Relative: 12 %
Neutro Abs: 2.3 10*3/uL (ref 1.7–7.7)
Neutrophils Relative %: 42 %
Platelets: 162 10*3/uL (ref 150–400)
RBC: 5.29 MIL/uL (ref 4.22–5.81)
RDW: 14.7 % (ref 11.5–15.5)
WBC: 5.4 10*3/uL (ref 4.0–10.5)
nRBC: 0 % (ref 0.0–0.2)

## 2023-12-07 LAB — COMPREHENSIVE METABOLIC PANEL WITH GFR
ALT: 33 U/L (ref 0–44)
AST: 35 U/L (ref 15–41)
Albumin: 3.8 g/dL (ref 3.5–5.0)
Alkaline Phosphatase: 59 U/L (ref 38–126)
Anion gap: 10 (ref 5–15)
BUN: 22 mg/dL (ref 8–23)
CO2: 23 mmol/L (ref 22–32)
Calcium: 9.4 mg/dL (ref 8.9–10.3)
Chloride: 103 mmol/L (ref 98–111)
Creatinine, Ser: 1.62 mg/dL — ABNORMAL HIGH (ref 0.61–1.24)
GFR, Estimated: 47 mL/min — ABNORMAL LOW (ref >60)
Glucose, Bld: 98 mg/dL (ref 70–99)
Potassium: 4.4 mmol/L (ref 3.5–5.1)
Sodium: 136 mmol/L (ref 135–145)
Total Bilirubin: 1.3 mg/dL — ABNORMAL HIGH (ref 0.0–1.2)
Total Protein: 7.6 g/dL (ref 6.5–8.1)

## 2023-12-07 MED ORDER — ENSURE ENLIVE PO LIQD
237.0000 mL | Freq: Two times a day (BID) | ORAL | Status: DC
  Administered 2023-12-07 – 2024-01-05 (×50): 237 mL via ORAL

## 2023-12-07 MED ORDER — HYDROXYZINE HCL 25 MG PO TABS
25.0000 mg | ORAL_TABLET | Freq: Three times a day (TID) | ORAL | Status: DC | PRN
  Administered 2023-12-07 – 2024-01-03 (×13): 25 mg via ORAL
  Filled 2023-12-07 (×15): qty 1

## 2023-12-07 NOTE — Plan of Care (Signed)
 Problem: RH Balance Goal: LTG: Patient will maintain dynamic sitting balance (OT) Description: LTG:  Patient will maintain dynamic sitting balance with assistance during activities of daily living (OT) Flowsheets (Taken 12/07/2023 1245) LTG: Pt will maintain dynamic sitting balance during ADLs with: Contact Guard/Touching assist Goal: LTG Patient will maintain dynamic standing with ADLs (OT) Description: LTG:  Patient will maintain dynamic standing balance with assist during activities of daily living (OT)  Flowsheets (Taken 12/07/2023 1245) LTG: Pt will maintain dynamic standing balance during ADLs with: Minimal Assistance - Patient > 75%   Problem: Sit to Stand Goal: LTG:  Patient will perform sit to stand in prep for activites of daily living with assistance level (OT) Description: LTG:  Patient will perform sit to stand in prep for activites of daily living with assistance level (OT) Flowsheets (Taken 12/07/2023 1245) LTG: PT will perform sit to stand in prep for activites of daily living with assistance level: Minimal Assistance - Patient > 75%   Problem: RH Grooming Goal: LTG Patient will perform grooming w/assist,cues/equip (OT) Description: LTG: Patient will perform grooming with assist, with/without cues using equipment (OT) Flowsheets (Taken 12/07/2023 1245) LTG: Pt will perform grooming with assistance level of: Minimal Assistance - Patient > 75%   Problem: RH Bathing Goal: LTG Patient will bathe all body parts with assist levels (OT) Description: LTG: Patient will bathe all body parts with assist levels (OT) Flowsheets (Taken 12/07/2023 1245) LTG: Pt will perform bathing with assistance level/cueing: Minimal Assistance - Patient > 75%   Problem: RH Dressing Goal: LTG Patient will perform upper body dressing (OT) Description: LTG Patient will perform upper body dressing with assist, with/without cues (OT). Flowsheets (Taken 12/07/2023 1245) LTG: Pt will perform upper body  dressing with assistance level of: Minimal Assistance - Patient > 75% Goal: LTG Patient will perform lower body dressing w/assist (OT) Description: LTG: Patient will perform lower body dressing with assist, with/without cues in positioning using equipment (OT) Flowsheets (Taken 12/07/2023 1245) LTG: Pt will perform lower body dressing with assistance level of: Minimal Assistance - Patient > 75%   Problem: RH Toileting Goal: LTG Patient will perform toileting task (3/3 steps) with assistance level (OT) Description: LTG: Patient will perform toileting task (3/3 steps) with assistance level (OT)  Flowsheets (Taken 12/07/2023 1245) LTG: Pt will perform toileting task (3/3 steps) with assistance level: Minimal Assistance - Patient > 75%   Problem: RH Functional Use of Upper Extremity Goal: LTG Patient will use RT/LT upper extremity as a (OT) Description: LTG: Patient will use right/left upper extremity as a stabilizer/gross assist/diminished/nondominant/dominant level with assist, with/without cues during functional activity (OT) Flowsheets (Taken 12/07/2023 1245) LTG: Use of upper extremity in functional activities: LUE as a stabilizer LTG: Pt will use upper extremity in functional activity with assistance level of: Minimal Assistance - Patient > 75%   Problem: RH Toilet Transfers Goal: LTG Patient will perform toilet transfers w/assist (OT) Description: LTG: Patient will perform toilet transfers with assist, with/without cues using equipment (OT) Flowsheets (Taken 12/07/2023 1245) LTG: Pt will perform toilet transfers with assistance level of: Minimal Assistance - Patient > 75%   Problem: RH Tub/Shower Transfers Goal: LTG Patient will perform tub/shower transfers w/assist (OT) Description: LTG: Patient will perform tub/shower transfers with assist, with/without cues using equipment (OT) Flowsheets (Taken 12/07/2023 1245) LTG: Pt will perform tub/shower stall transfers with assistance level of:  Minimal Assistance - Patient > 75%   Problem: RH Memory Goal: LTG Patient will demonstrate ability for day to day  recall/carry over during activities of daily living with assistance level (OT) Description: LTG:  Patient will demonstrate ability for day to day recall/carry over during activities of daily living with assistance level (OT). Flowsheets (Taken 12/07/2023 1245) LTG:  Patient will demonstrate ability for day to day recall/carry over during activities of daily living with assistance level (OT): Supervision

## 2023-12-07 NOTE — Progress Notes (Signed)
 Spoke with neurology services in regards to CVA prophylaxis and plan is to continue Eliquis 5 mg twice daily with aspirin  81 mg daily and Plavix  discontinued.

## 2023-12-07 NOTE — Progress Notes (Signed)
 PMR Admission Coordinator Pre-Admission Assessment   Patient: Richard [name] is an 68 y.o., male MRN: [id number] DOB: 03/08/1956 Height: 5\' 10"  (177.8 cm) Weight: 78.6 kg   Insurance Information HMO:     PPO:      PCP:      IPA:      80/20:      OTHER:  PRIMARY: [facility]       Policy#:  [id number]      Subscriber: Pt CM Name: Cassie      Phone#:      Fax#: [phone] Pt. Approved by Cassie on 4/29 for admit 9/52-8/41  Pre-Cert#: [id number]      Employer:  Benefits:  Phone #:      Name:  Eff. Date:      Deduct:       Out of Pocket Max:       Life Max:  CIR:       SNF:  Outpatient:      Co-Pay:  Home Health:       Co-Pay:  DME:      Co-Pay: Providers: in network  SECONDARY:   C[employer] Medicare advantage    Policy#:  [id number]    Phone#:    Artist:       Phone#:    The Data processing manager" for patients in Inpatient Rehabilitation Facilities with attached "Privacy Act Statement-Health Care Records" was provided and verbally reviewed with: Patient   Emergency Contact Information Contact Information       Name Relation Home Work Mobile    Keys,Veronica Spouse [phone]             Other Contacts   None on File        Current Medical History  Patient Admitting Diagnosis: CVA History of Present Illness: [name] is a 68 year old right-handed male with history significant for HIV maintained on Dovato , CKD stage III with baseline creatinine 1.3-1.5, CAD/MI/CABG 2015 followed by Dr. 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  Per chart review patient lives with spouse.  1 level home 2 steps to enter.  Independent prior to admission.  Presented 12/01/2023 with left-sided weakness and dysarthria of unclear duration.  CT of the head showed subtle hypoattenuation involving the right caudate and right  lentiform nuclei.  No acute intracranial hemorrhage.  Remote infarct involving the right frontal operculum, insula and superior right temporal lobe.  CTA of head and neck showed occlusion of the right M1 segment as well as proximal M2 branch of the right MCA and intraluminal thrombus noted.  Reconstitution of multiple M2 branches of the right MCA.  Several M2 inferior division branches demonstrated diminished intraluminal contrast.  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  Reconstitution of the supraclinoid left ICA as well as left ACA and left MCA via the circle of Willis.  Admission chemistries unremarkable except creatinine 1.33, alcohol negative, SARS coronavirus negative.  Patient underwent revascularization of right MCA occlusion per interventional radiology.  MRI follow-up revealed moderate-sized acute infarct in the right MCA distribution.  Echocardiogram with ejection fraction of 20 to 25%.  The left ventricle demonstrating global hypokinesis.  Grade 1 diastolic dysfunction.  Neurology follow-up patient initially maintained on low-dose aspirin  81 mg daily and Plavix  75 mg daily for CVA prophylaxis and transition to Eliquis and continue Plavix  with aspirin  discontinued.  Cardiology services continues to follow for acute on chronic biventricular systolic heart failure his Toprol  was resumed there was some difficulty in tapering off pressors with bouts of hypotension.    Patient currently on a mechanical soft diet.  Therapy evaluations completed due to patient decreased functional mobility left-sided weakness and dysarthria was admitted for a comprehensive rehab program.  Complete NIHSS TOTAL: 13   Patient's medical record from [facility] has been reviewed by the rehabilitation admission coordinator and physician.   Past Medical History      Past Medical History:  Diagnosis Date   Coronary artery disease      a.  cath 04/02/2014 3v dx 80-90% in D1, 80-90% mid LAD, 95% apical LAD, 95% distal inferior LV branch of LCx, total occlusion of prox RCA  b). Cath 04/03/2014 DES to mid LAD, DES x3 to prox D1, DAPT indefinitely. LCx lesion untreated   GERD (gastroesophageal reflux disease)     HIV (human immunodeficiency virus infection) (HCC)     Hypertension     Ischemic cardiomyopathy 08/09/2013    EF 20-25 percent by echo in 04/2014, s/p St. Jude Fortify Assura model [id number] serial number [id number]   Stroke [facility])            Has the patient had major surgery during 100 days prior to admission? Yes   Family History   family history includes Heart disease in his mother.   Current Medications  Current Medications    Current Facility-Administered Medications:    acetaminophen  (TYLENOL ) tablet 650 mg, 650 mg, Oral, Q4H PRN, 650 mg at 12/05/23 0857 **OR** acetaminophen  (TYLENOL ) 160 MG/5ML solution 650 mg, 650 mg, Per Tube, Q4H PRN **OR** acetaminophen  (TYLENOL ) suppository 650 mg, 650 mg, Rectal, Q4H PRN, Toberman, Stevi W, NP   aspirin  EC tablet 81 mg, 81 mg, Oral, Daily, de La Torre, [place] E, NP, 81 mg at 12/05/23 0857   atorvastatin  (LIPITOR) tablet 40 mg, 40 mg, Oral, Daily, Lee, [place], NP, 40 mg at 12/05/23 0858   clopidogrel  (PLAVIX ) tablet 75 mg, 75 mg, Oral, Daily, de La Torre, Verdi E, NP, 75 mg at 12/05/23 4696   dolutegravir -lamiVUDine  (DOVATO ) 50-300 MG per tablet 1 tablet, 1 tablet, Oral, Daily, Janas Meadows, Lauren E, PA-C, 1 tablet at 12/05/23 0857   enoxaparin  (LOVENOX ) injection 40 mg, 40 mg, Subcutaneous, Q24H, Toberman, Stevi W, NP, 40 mg at 12/05/23 2952   ezetimibe  (ZETIA ) tablet 10 mg, 10 mg, Oral, Daily, Consuelo Denmark, MD, 10 mg at 12/05/23 8413   famotidine  (PEPCID ) tablet 20 mg, 20 mg, Oral, Daily, Autry, Lauren E, PA-C, 20 mg at 12/05/23 2440   metoprolol  succinate (TOPROL -XL) 24 hr tablet 12.5 mg, 12.5 mg, Oral, Daily, Lee, [place], NP, 12.5 mg at 12/05/23 1222   Oral care mouth rinse,  15 mL, Mouth Rinse, PRN, Eleni Griffin, MD   polyethylene glycol (MIRALAX / GLYCOLAX) packet 17 g, 17 g, Oral, Daily, Amelie Baize, Erin C, NP, 17 g at 12/05/23 1027   senna-docusate (Senokot-S) tablet 1 tablet, 1 tablet, Oral, QHS PRN, Toberman, Stevi W, NP, 1 tablet at 12/04/23 2139   sodium chloride  flush (NS) 0.9 % injection 3 mL, 3 mL, Intravenous, Once, Trish Furl, MD     Patients Current Diet:  Diet Order                  DIET DYS 3 Room service appropriate? Yes with Assist; Fluid consistency: Thin  Diet effective now  Precautions / Restrictions Precautions Precautions: Fall, Other (comment) Precaution/Restrictions Comments: SBP 130-180; watch HR Restrictions Weight Bearing Restrictions Per Provider Order: No    Has the patient had 2 or more falls or a fall with injury in the past year? Yes   Prior Activity Level Community (5-7x/wk): Pt. active in the community PTA   Prior Functional Level Self Care: Did the patient need help bathing, dressing, using the toilet or eating? Independent   Indoor Mobility: Did the patient need assistance with walking from room to room (with or without device)? Independent   Stairs: Did the patient need assistance with internal or external stairs (with or without device)? Independent   Functional Cognition: Did the patient need help planning regular tasks such as shopping or remembering to take medications? Independent   Patient Information Are you of Hispanic, Latino/a,or Spanish origin?: A. No, not of Hispanic, Latino/a, or Spanish origin What is your race?: A. White Do you need or want an interpreter to communicate with a doctor or health care staff?: 0. No   Patient's Response To:  Health Literacy and Transportation Is the patient able to respond to health literacy and transportation needs?: Yes Health Literacy - How often do you need to have someone help you when you read instructions, pamphlets, or other  written material from your doctor or pharmacy?: Never In the past 12 months, has lack of transportation kept you from medical appointments or from getting medications?: No In the past 12 months, has lack of transportation kept you from meetings, work, or from getting things needed for daily living?: No   Home Assistive Devices / Equipment Home Equipment: None   Prior Device Use: Indicate devices/aids used by the patient prior to current illness, exacerbation or injury? None of the above   Current Functional Level Cognition   Arousal/Alertness: Awake/alert Overall Cognitive Status:  (further assessment needed) Orientation Level: Oriented X4 Attention: Focused Focused Attention: Impaired Focused Attention Impairment: Functional basic (L inattention/neglect) Memory: Appears intact Awareness: Appears intact Problem Solving: Appears intact (further assessment needed)    Extremity Assessment (includes Sensation/Coordination)   Upper Extremity Assessment: Defer to OT evaluation LUE Deficits / Details: sensation intact, no activation except at shoulder, is able to shrug minimally LUE Sensation: WNL LUE Coordination: decreased fine motor, decreased gross motor  Lower Extremity Assessment: LLE deficits/detail LLE Deficits / Details: trace quads and gastrocs activation noted when cued, noted improved quads activation when standing, generally very weak in L leg; able to detect touch     ADLs   Overall ADL's : Needs assistance/impaired Eating/Feeding: Moderate assistance, Sitting Grooming: Wash/dry hands, Wash/dry face, Set up, Sitting Grooming Details (indicate cue type and reason): using R hand only, max A to be held up in sitting by PT Upper Body Bathing: Moderate assistance, Sitting Lower Body Bathing: Maximal assistance, Total assistance, Sit to/from stand, Sitting/lateral leans Upper Body Dressing : Moderate assistance, Sitting Lower Body Dressing: Maximal assistance, Total assistance,  Sit to/from stand, Sitting/lateral leans Toilet Transfer: Moderate assistance, +2 for physical assistance, +2 for safety/equipment, Stand-pivot, BSC/3in1 Toilet Transfer Details (indicate cue type and reason): simulated with stedy Toileting- Clothing Manipulation and Hygiene: Total assistance, +2 for physical assistance, +2 for safety/equipment, Sit to/from stand, Sitting/lateral lean Functional mobility during ADLs: Moderate assistance, +2 for physical assistance, +2 for safety/equipment, Cueing for safety, Cueing for sequencing General ADL Comments: Patient session focus on co-treat with PT in order to address goals and functional deficits. Patient with improved L neglect, and is able to  turn L and identify numbers on OT's gloved fingers with improved accuracy. Patient is mod A for bed mobility when rolling to the L, and CGA when holding on footboard with R hand. Patient requiring up to max A to maintain sitting balance when using R hand to complete ADLs. OT and PT using stedy to come into standing, requiring mod A of 2 to come into standing, with heavy L lateral lean in stedy frame. Patient them completing 2 sit<>stands from chair using handles of BSC to promote weight bearing in LUE and LLE. OT continues to highly recommend intensive rehab >3 hours. OT will continue to follow.     Mobility   Overal bed mobility: Needs Assistance Bed Mobility: Rolling, Sidelying to Sit Rolling: Mod assist Sidelying to sit: Mod assist, HOB elevated, Used rails Sit to supine: Max assist, +2 for physical assistance, +2 for safety/equipment General bed mobility comments: assist with LLE and trunk     Transfers   Overall transfer level: Needs assistance Equipment used: Ambulation equipment used Transfers: Sit to/from Stand, Bed to chair/wheelchair/BSC Sit to Stand: Mod assist, +2 physical assistance, +2 safety/equipment Bed to/from chair/wheelchair/BSC transfer type:: Via Lift equipment Transfer via Lift  Equipment: Stedy General transfer comment: STS in stedy with +2 mod assist. Heavy L lean. Stedy transfer bed to recliner.     Ambulation / Gait / Stairs / Wheelchair Mobility   Ambulation/Gait General Gait Details: deferred     Posture / Balance Dynamic Sitting Balance Sitting balance - Comments: reliant on external assist, posterior and L lateral lean sitting EOB, able to have moments of CGA sitting balance when holding onto footboard with RUE Balance Overall balance assessment: Needs assistance Sitting-balance support: Single extremity supported, Feet supported Sitting balance-Leahy Scale: Poor Sitting balance - Comments: reliant on external assist, posterior and L lateral lean sitting EOB, able to have moments of CGA sitting balance when holding onto footboard with RUE Postural control: Right lateral lean, Posterior lean Standing balance support: During functional activity, Reliant on assistive device for balance, Single extremity supported Standing balance-Leahy Scale: Zero Standing balance comment: reliant on stedy and OT and PT support     Special needs/care consideration Skin intact    Previous Home Environment (from acute therapy documentation) Living Arrangements: Spouse/significant other Available Help at Discharge: Family, Available 24 hours/day Type of Home: House Home Layout: One level Home Access: Stairs to enter Entrance Stairs-Rails: Right Entrance Stairs-Number of Steps: 2 Bathroom Shower/Tub: Engineer, manufacturing systems: Standard Bathroom Accessibility: Yes How Accessible: Accessible via walker Home Care Services: No   Discharge Living Setting Plans for Discharge Living Setting: Patient's home Type of Home at Discharge: House Discharge Home Layout: One level Discharge Home Access: Stairs to enter Entrance Stairs-Rails: Right Entrance Stairs-Number of Steps: 2 Discharge Bathroom Shower/Tub: Tub/shower unit Discharge Bathroom Toilet: Standard Discharge  Bathroom Accessibility: Yes How Accessible: Accessible via walker Does the patient have any problems obtaining your medications?: No   Social/Family/Support Systems Patient Roles: Spouse Contact Information: [phone] Anticipated Caregiver: [name] Anticipated Caregiver's Contact Information: Wife works, plans to hire caregivers or get [facility] caregivers Caregiver Availability: 24/7 Discharge Plan Discussed with Primary Caregiver: Yes Is Caregiver In Agreement with Plan?: No Does Caregiver/Family have Issues with Lodging/Transportation while Pt is in Rehab?: No   Goals Patient/Family Goal for Rehab: PT/OT/SLP Min A Expected length of stay: 12-14 days Pt/Family Agrees to Admission and willing to participate: Yes Program Orientation Provided & Reviewed with Pt/Caregiver Including Roles  & Responsibilities: Yes  Decrease burden of Care through IP rehab admission: not anticipated   Possible need for SNF placement upon discharge: not anticipated   Patient Condition: I have reviewed medical records from [facility], spoken with CM, and patient and spouse. I met with patient at the bedside for inpatient rehabilitation assessment.  Patient will benefit from ongoing PT, OT, and SLP, can actively participate in 3 hours of therapy a day 5 days of the week, and can make measurable gains during the admission.  Patient will also benefit from the coordinated team approach during an Inpatient Acute Rehabilitation admission.  The patient will receive intensive therapy as well as Rehabilitation physician, nursing, social worker, and care management interventions.  Due to safety, skin/wound care, disease management, medication administration, pain management, and patient education the patient requires 24 hour a day rehabilitation nursing.  The patient is currently ** with mobility and basic ADLs.  Discharge setting and therapy post discharge at home with home health is anticipated.  Patient has  agreed to participate in the Acute Inpatient Rehabilitation Program and will admit today.   Preadmission Screen Completed By:  Dorena Gander, 12/05/2023 12:59 PM ______________________________________________________________________   Discussed status with Dr. Raynaldo Call on 12/06/23 at 1026 and received approval for admission today.   Admission Coordinator:  Dorena Gander, CCC-SLP, time 1026/Date 12/06/23    Assessment/Plan: Diagnosis: R MCA stroke with L neglect Does the need for close, 24 hr/day Medical supervision in concert with the patient's rehab needs make it unreasonable for this patient to be served in a less intensive setting? Yes Co-Morbidities requiring supervision/potential complications: CHF EF 20-25%, HIV, severe CAD, ishcemic CMO; CVA 2008- no residual deficits; ICD, HLD, hx of v tach; L neglect, L hemianopia; GERD Due to bladder management, bowel management, safety, skin/wound care, disease management, medication administration, pain management, and patient education, does the patient require 24 hr/day rehab nursing? Yes Does the patient require coordinated care of a physician, rehab nurse, PT, OT, and SLP to address physical and functional deficits in the context of the above medical diagnosis(es)? Yes Addressing deficits in the following areas: balance, endurance, locomotion, strength, transferring, bowel/bladder control, bathing, dressing, feeding, grooming, toileting, cognition, speech, language, and swallowing Can the patient actively participate in an intensive therapy program of at least 3 hrs of therapy 5 days a week? Yes The potential for patient to make measurable gains while on inpatient rehab is good Anticipated functional outcomes upon discharge from inpatient rehab: min assist PT, min assist OT, min assist SLP Estimated rehab length of stay to reach the above functional goals is: 14-18 days Anticipated discharge destination: Home 10. Overall Rehab/Functional Prognosis:  good     MD Signature:

## 2023-12-07 NOTE — Evaluation (Addendum)
 Speech Language Pathology Assessment and Plan  Patient Details  Name: Richard [name] MRN: [id number] Date of Birth: 19-Mar-1956  SLP Diagnosis: Dysarthria;Cognitive Impairments;Dysphagia  Rehab Potential: Excellent ELOS: 12-14 days    Today's Date: 12/07/2023 SLP Individual Time: 0900-1000     Hospital Problem: Principal Problem:   Right middle cerebral artery stroke [facility])  Past Medical History:  Past Medical History:  Diagnosis Date   Coronary artery disease    a. cath 04/02/2014 3v dx 80-90% in D1, 80-90% mid LAD, 95% apical LAD, 95% distal inferior LV branch of LCx, total occlusion of prox RCA  b). Cath 04/03/2014 DES to mid LAD, DES x3 to prox D1, DAPT indefinitely. LCx lesion untreated   GERD (gastroesophageal reflux disease)    HIV (human immunodeficiency virus infection) (HCC)    Hypertension    Ischemic cardiomyopathy 08/09/2013   EF 20-25 percent by echo in 04/2014, s/p [employer]ura model [id number] serial number [id number]   Stroke [facility])    Past Surgical History:  Past Surgical History:  Procedure Laterality Date   CARDIAC CATHETERIZATION  04/02/2014   DR Loetta Ringer   ESOPHAGOGASTRODUODENOSCOPY (EGD) WITH PROPOFOL  N/A 09/08/2018   Procedure: ESOPHAGOGASTRODUODENOSCOPY (EGD) WITH PROPOFOL ;  Surgeon: Alvis Jourdain, MD;  Location: [facility] ENDOSCOPY;  Service: Endoscopy;  Laterality: N/A;   IMPLANTABLE CARDIOVERTER DEFIBRILLATOR IMPLANT N/A 09/10/2014   Procedure: IMPLANTABLE CARDIOVERTER DEFIBRILLATOR IMPLANT;  Surgeon: Luana Rumple, MD;  [facility] model [id number] serial number [id number]   IR CT HEAD LTD  12/01/2023   IR PERCUTANEOUS ART THROMBECTOMY/INFUSION INTRACRANIAL INC DIAG ANGIO  12/01/2023   IR US  GUIDE VASC ACCESS RIGHT  12/01/2023   LEFT AND RIGHT HEART CATHETERIZATION WITH CORONARY ANGIOGRAM N/A 04/02/2014   Procedure: LEFT AND RIGHT HEART CATHETERIZATION WITH CORONARY ANGIOGRAM;  Surgeon: Millicent Ally, MD;  Location: [facility] CATH LAB;  Service:  Cardiovascular;  Laterality: N/A;   MEDIAL COLLATERAL LIGAMENT REPAIR, KNEE Right 08/25/2015   Procedure: RIGHT THUMB RADICAL COLLATERAL LIGAMENT REPAIR;  Surgeon: Florida Hurter, MD;  Location: [facility] OR;  Service: Orthopedics;  Laterality: Right;   PERCUTANEOUS CORONARY STENT INTERVENTION (PCI-S) N/A 04/03/2014   Procedure: PERCUTANEOUS CORONARY STENT INTERVENTION (PCI-S);  Surgeon: Peter M Swaziland, MD;  Location: [facility] CATH LAB;  Service: Cardiovascular;  Laterality: N/A;   RADIOLOGY WITH ANESTHESIA N/A 12/01/2023   Procedure: RADIOLOGY WITH ANESTHESIA;  Surgeon: Radiologist, Medication, MD;  Location: [facility] OR;  Service: Radiology;  Laterality: N/A;  Juana Nones DILATION N/A 09/08/2018   Procedure: SAVORY DILATION;  Surgeon: Alvis Jourdain, MD;  Location: [facility] ENDOSCOPY;  Service: Endoscopy;  Laterality: N/A;    Assessment / Plan / Recommendation Clinical Impression HPI: Richard [name] is a 68 year old right-handed male with history significant for HIV maintained on Dovato , CKD stage III with baseline creatinine 1.3-1.5, CAD/MI/CABG 2015 followed by Dr. 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 Per chart review patient lives with spouse. 1 level home 2 steps to enter. Independent prior to admission. Presented 12/01/2023 with left-sided weakness and dysarthria of unclear duration. CT of the head showed subtle hypoattenuation involving the right caudate and right lentiform nuclei. No acute intracranial hemorrhage. Remote infarct involving the right frontal operculum, insula and superior right temporal lobe. CTA of head and neck showed occlusion of the right M1 segment as well as proximal M2 branch of the right MCA and intraluminal thrombus noted. Reconstitution of multiple M2 branches of the right  MCA. Several M2 inferior division branches demonstrated diminished intraluminal  contrast. 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 Reconstitution of the supraclinoid left ICA as well as left ACA and left MCA via the circle of Willis. Admission chemistries unremarkable except creatinine 1.33, alcohol negative, SARS coronavirus negative. Patient underwent revascularization of right MCA occlusion per interventional radiology. MRI follow-up revealed moderate-sized acute infarct in the right MCA distribution. Echocardiogram with ejection fraction of 20 to 25%. The left ventricle demonstrating global hypokinesis. Grade 1 diastolic dysfunction. Neurology follow-up patient initially maintained on low-dose aspirin  81 mg daily and Plavix  75 mg daily for CVA prophylaxis and transition to Eliquis  and continue Plavix  with aspirin  discontinued. Cardiology services continues to follow for acute on chronic biventricular systolic heart failure his Toprol  was resumed there was some difficulty in tapering off pressors with bouts of hypotension. Patient currently on a mechanical soft diet. Therapy evaluations completed due to patient decreased functional mobility left-sided weakness and dysarthria was admitted for a comprehensive rehab program.   Clinical Impression:  Bedside Swallow Evaluation:  A beside swallow evaluation was completed to assess for s/sx of oropharyngeal dysphagia. Oral mechansim exam revealed L facial asymmetry and lingual weakness/reduced ROM to the L. POs administered included thin liquids, NTL, puree and D2 solids. Patient with mildly prolonged mastication and L buccal pocketing, though patient independently cleared with digital sweep and liquid wash. Patient with intermittent throat clears throughout PO intake and for the remainder of the evaluation which may indicate bolus misdirection vs baseline. Recommend D2/thin with no straws and repeat MBS tomorrow. Patient should receive full supervision for use of compensatory  strategies (small bites/sips, digital sweep and slow rate). Cognitive-Linguistic: Patient was evaluated via the Cognistat to assess cognitive-linguistic functioning. Patient scored WFL on all subtests with the exception of severe deficits in delayed recall and executive functioning. Informally, patient with impairments in sustained attention, requiring occasional cues to attend to assessment. Patient with functional expressive/receptive language. Recommend targeting STM and mildly complex problem solving during inpatient rehabilitation stay Dysarthria: Patient presents with mild dysarthria characterized by imprecise articulation and reduced respiratory support resulting in ~80% intelligibility at the conversational level. Patient with sustained phonation of ~3 seconds, which is significantly below average. Pt would benefit from skilled ST services to maximize dysphagia, cognition and dysarthria  in order to maximize functional independence at d/c. Anticipate patient will require supervision at d/c and f/u SLP services.    Skilled Therapeutic Interventions          Patient evaluated using a standardized cognitive linguistic assessment and bedside swallow evaluation to assess current cognitive, communicative and swallowing function. See above for details.    SLP Assessment  Patient will need skilled Speech Lanaguage Pathology Services during CIR admission    Recommendations  SLP Diet Recommendations: Dysphagia 2 (Fine chop);Thin Liquid Administration via: Cup;No straw Medication Administration: Whole meds with puree Supervision: Full supervision/cueing for compensatory strategies Compensations: Slow rate;Small sips/bites;Lingual sweep for clearance of pocketing;Monitor for anterior loss Postural Changes and/or Swallow Maneuvers: Seated upright 90 degrees Oral Care Recommendations: Oral care BID Patient destination: Home Follow up Recommendations: Home Health SLP Equipment Recommended: None  recommended by SLP    SLP Frequency 3 to 5 out of 7 days   SLP Duration  SLP Intensity  SLP Treatment/Interventions 12-14 days  Minumum of 1-2 x/day, 30 to 90 minutes  Cognitive remediation/compensation;Cueing hierarchy;Dysphagia/aspiration precaution training;Functional tasks;Internal/external aids;Patient/family education;Speech/Language facilitation;Therapeutic Activities    Pain  None reported   SLP Evaluation Cognition Overall Cognitive Status: Impaired/Different from baseline Arousal/Alertness: Awake/alert Orientation Level: Oriented X4 Year: 2025 Month: April Day of Week: Correct Attention: Sustained;Focused Focused Attention: Appears intact Sustained Attention: Impaired Memory: Impaired Memory Impairment: Storage deficit;Retrieval deficit Awareness: Impaired Awareness Impairment: Emergent impairment Problem Solving: Impaired (mildly complex) Executive Function:  (all impaired for BADLs) Safety/Judgment: Impaired  Comprehension Auditory Comprehension Overall Auditory Comprehension: Appears within functional limits for tasks assessed Expression Expression Primary Mode of Expression: Verbal Verbal Expression Overall Verbal Expression: Appears within functional limits for tasks assessed Initiation: No impairment Repetition: No impairment Naming: No impairment Written Expression Dominant Hand: Right Oral Motor Oral Motor/Sensory Function Overall Oral Motor/Sensory Function: Moderate impairment Facial ROM: Reduced left;Suspected CN VII (facial) dysfunction Facial Symmetry: Abnormal symmetry left;Suspected CN VII (facial) dysfunction Facial Strength: Reduced left;Suspected CN VII (facial) dysfunction Facial Sensation: Reduced left;Suspected CN V (Trigeminal) dysfunction Lingual ROM: Reduced left Lingual Symmetry: Abnormal symmetry left;Suspected CN XII (hypoglossal) dysfunction Lingual Strength: Reduced;Suspected CN XII (hypoglossal) dysfunction Velum: Within  Functional Limits Mandible: Within Functional Limits Motor Speech Overall Motor Speech: Impaired Respiration: Impaired Level of Impairment: Sentence Phonation: Normal Resonance: Within functional limits Articulation: Impaired Level of Impairment: Sentence Intelligibility: Intelligibility reduced Conversation: 75-100% accurate Motor Planning: Witnin functional limits  Care Tool Care Tool Cognition Ability to hear (with hearing aid or hearing appliances if normally used Ability to hear (with hearing aid or hearing appliances if normally used): 0. Adequate - no difficulty in normal conservation, social interaction, listening to TV   Expression of Ideas and Wants Expression of Ideas and Wants: 3. Some difficulty - exhibits some difficulty with expressing needs and ideas (e.g, some words or finishing thoughts) or speech is not clear   Understanding Verbal and Non-Verbal Content Understanding Verbal and Non-Verbal Content: 3. Usually understands - understands most conversations, but misses some part/intent of message. Requires cues at times to understand  Memory/Recall Ability Memory/Recall Ability : That he or she is in a hospital/hospital unit    Bedside Swallowing Assessment General Previous Swallow Assessment: 4/25 Diet Prior to this Study: Dysphagia 3 (mechanical soft);Thin liquids (Level 0) Respiratory Status: Room air History of Recent Intubation: No Behavior/Cognition: Alert;Cooperative Oral Cavity - Dentition: Edentulous;Dentures, top Self-Feeding Abilities: Needs assist Vision: Functional for self-feeding Patient Positioning: Upright in bed Baseline Vocal Quality: Low vocal intensity Volitional Cough: Strong Volitional Swallow: Able to elicit  Ice Chips Ice chips: Not tested Thin Liquid Thin Liquid: Impaired Presentation: Cup;Spoon Pharyngeal  Phase Impairments: Throat Clearing - Immediate;Throat Clearing - Delayed Nectar Thick Nectar Thick Liquid:  Impaired Presentation: Cup Pharyngeal Phase Impairments: Throat Clearing - Delayed;Throat Clearing - Immediate Honey Thick   Puree Puree: Impaired Pharyngeal Phase Impairments: Throat Clearing - Immediate;Throat Clearing - Delayed Solid Solid: Impaired Oral Phase Impairments: Poor awareness of bolus Oral Phase Functional Implications: Left lateral sulci pocketing;Oral residue BSE Assessment Risk for Aspiration Impact on safety and function: Mild aspiration risk Other Related Risk Factors: Previous CVA;Lethargy  Short Term Goals: Week 1: SLP Short Term Goal 1 (Week 1): Patient will utilize compensatory strategies during consumption of least restrictive diet with min multimodal A SLP Short Term Goal 2 (Week 1): Patient will increase speech intelligibility to 90% with use of compensatory strategies given min multimodal A SLP Short Term Goal 3 (Week 1): Patient will demonstrate problem solving skills during mildly complex functional situations given min multimodal A SLP Short Term Goal 4 (Week 1): Patient will recall and utilize memory compensatory strategies given min multimodal A  SLP Short Term Goal 5 (Week 1): Patient will sustain attention to functional tasks for upwards of 15 minutes given min multimodal A SLP Short Term Goal 6 (Week 1): Patient will demonstrate emergent awareness of mistakes given min multimodal A  Refer to Care Plan for Long Term Goals  Recommendations for other services: None   Discharge Criteria: Patient will be discharged from SLP if patient refuses treatment 3 consecutive times without medical reason, if treatment goals not met, if there is a change in medical status, if patient makes no progress towards goals or if patient is discharged from hospital.  The above assessment, treatment plan, treatment alternatives and goals were discussed and mutually agreed upon: by patient  Brailey Buescher M.A., CCC-SLP 12/07/2023, 10:34 AM

## 2023-12-07 NOTE — Evaluation (Signed)
 Occupational Therapy Assessment and Plan  Patient Details  Name: Richard [name] MRN: [id number] Date of Birth: [birth year]  OT Diagnosis: abnormal posture, cognitive deficits, disturbance of vision, flaccid hemiplegia and hemiparesis, and hemiplegia affecting non-dominant side Rehab Potential: Rehab Potential (ACUTE ONLY): Good ELOS: 3.5 weeks   Today's Date: 12/07/2023 OT Individual Time: 0803-0900 OT Individual Time Calculation (min): 57 min     Hospital Problem: Principal Problem:   Right middle cerebral artery stroke [facility])   Past Medical History:  Past Medical History:  Diagnosis Date   Coronary artery disease    a. cath 04/02/2014 3v dx 80-90% in D1, 80-90% mid LAD, 95% apical LAD, 95% distal inferior LV branch of LCx, total occlusion of prox RCA  b). Cath 04/03/2014 DES to mid LAD, DES x3 to prox D1, DAPT indefinitely. LCx lesion untreated   GERD (gastroesophageal reflux disease)    HIV (human immunodeficiency virus infection) (HCC)    Hypertension    Ischemic cardiomyopathy 08/09/2013   EF 20-25 percent by echo in 04/2014, s/p [employer] model [id number] serial number [id number]   Stroke [facility])    Past Surgical History:  Past Surgical History:  Procedure Laterality Date   CARDIAC CATHETERIZATION  04/02/2014   DR Loetta Ringer   ESOPHAGOGASTRODUODENOSCOPY (EGD) WITH PROPOFOL  N/A 09/08/2018   Procedure: ESOPHAGOGASTRODUODENOSCOPY (EGD) WITH PROPOFOL ;  Surgeon: Alvis Jourdain, MD;  Location: [facility] ENDOSCOPY;  Service: Endoscopy;  Laterality: N/A;   IMPLANTABLE CARDIOVERTER DEFIBRILLATOR IMPLANT N/A 09/10/2014   Procedure: IMPLANTABLE CARDIOVERTER DEFIBRILLATOR IMPLANT;  Surgeon: Luana Rumple, MD;  [facility] [place] model [id number] serial number [id number]   IR CT HEAD LTD  12/01/2023   IR PERCUTANEOUS ART THROMBECTOMY/INFUSION INTRACRANIAL INC DIAG ANGIO  12/01/2023   IR US  GUIDE VASC ACCESS RIGHT  12/01/2023   LEFT AND RIGHT HEART CATHETERIZATION WITH CORONARY ANGIOGRAM N/A  04/02/2014   Procedure: LEFT AND RIGHT HEART CATHETERIZATION WITH CORONARY ANGIOGRAM;  Surgeon: Millicent Ally, MD;  Location: [facility] CATH LAB;  Service: Cardiovascular;  Laterality: N/A;   MEDIAL COLLATERAL LIGAMENT REPAIR, KNEE Right 08/25/2015   Procedure: RIGHT THUMB RADICAL COLLATERAL LIGAMENT REPAIR;  Surgeon: Florida Hurter, MD;  Location: [facility] OR;  Service: Orthopedics;  Laterality: Right;   PERCUTANEOUS CORONARY STENT INTERVENTION (PCI-S) N/A 04/03/2014   Procedure: PERCUTANEOUS CORONARY STENT INTERVENTION (PCI-S);  Surgeon: Peter M Swaziland, MD;  Location: [facility] CATH LAB;  Service: Cardiovascular;  Laterality: N/A;   RADIOLOGY WITH ANESTHESIA N/A 12/01/2023   Procedure: RADIOLOGY WITH ANESTHESIA;  Surgeon: Radiologist, Medication, MD;  Location: [facility] OR;  Service: Radiology;  Laterality: N/A;  Juana Nones DILATION N/A 09/08/2018   Procedure: SAVORY DILATION;  Surgeon: Alvis Jourdain, MD;  Location: [facility] ENDOSCOPY;  Service: Endoscopy;  Laterality: N/A;    Assessment & Plan Clinical Impression: Richard [name] is a 68 year old right-handed male with history significant for HIV maintained on Dovato , CKD stage IIIa with baseline creatinine 1.3-1.5, CAD/MI/CABG 2015 followed by Dr. 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 Per chart review patient lives with spouse. 1 level home 2 steps to enter. Independent prior to admission. Presented 12/01/2023 with left-sided weakness and dysarthria of unclear duration. CT of the head showed subtle hypoattenuation involving the right caudate and right lentiform nuclei. No acute intracranial hemorrhage. Remote infarct involving the right frontal operculum, insula and superior right temporal lobe. CTA of head and neck showed occlusion of the right M1 segment as well  as proximal M2 branch of the right MCA and intraluminal thrombus noted.  Reconstitution of multiple M2 branches of the right MCA. Several M2 inferior division branches demonstrated diminished intraluminal contrast. 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 Reconstitution of the supraclinoid left ICA as well as left ACA and left MCA via the circle of Willis. Admission chemistries unremarkable except creatinine 1.33, alcohol negative, SARS coronavirus negative. Patient underwent revascularization of right MCA occlusion per interventional radiology. MRI follow-up revealed moderate-sized acute infarct in the right MCA distribution. Echocardiogram with ejection fraction of 20 to 25%. The left ventricle demonstrating global hypokinesis. Grade 1 diastolic dysfunction. Neurology follow-up patient initially maintained on low-dose aspirin  81 mg daily and Plavix  75 mg daily for CVA prophylaxis and transition to Eliquis and continue Plavix  with aspirin  discontinued. Cardiology services continues to follow for acute on chronic biventricular systolic heart failure his Toprol  was resumed there was some difficulty in tapering off pressors with bouts of hypotension. Patient currently on a mechanical soft diet.  Patient transferred to CIR on 12/06/2023 .    Patient currently requires max-total with basic self-care skills secondary to muscle weakness, decreased cardiorespiratoy endurance, impaired timing and sequencing, abnormal tone, unbalanced muscle activation, decreased coordination, and decreased motor planning, decreased visual perceptual skills and decreased visual motor skills, decreased attention to left and decreased motor planning, decreased initiation, decreased attention, decreased awareness, decreased problem solving, decreased safety awareness, and decreased memory, central origin, and decreased sitting balance, decreased standing balance, decreased postural control, hemiplegia, and decreased balance strategies.  Prior to  hospitalization, patient could complete BADLs with independent .  Patient will benefit from skilled intervention to decrease level of assist with basic self-care skills prior to discharge home with care partner.  Anticipate patient will require 24 hour supervision and follow up home health.  OT - End of Session Activity Tolerance: Decreased this session Endurance Deficit: Yes OT Assessment Rehab Potential (ACUTE ONLY): Good OT Patient demonstrates impairments in the following area(s): Balance;Perception;Safety;Cognition;Sensory;Skin Integrity;Endurance;Vision;Motor;Pain;Edema OT Basic ADL's Functional Problem(s): Grooming;Bathing;Dressing;Toileting OT Transfers Functional Problem(s): Toilet;Tub/Shower OT Additional Impairment(s): Fuctional Use of Upper Extremity OT Plan OT Intensity: Minimum of 1-2 x/day, 45 to 90 minutes OT Frequency: 5 out of 7 days OT Duration/Estimated Length of Stay: 3.5 weeks OT Treatment/Interventions: Balance/vestibular training;Discharge planning;Functional electrical stimulation;Pain management;Self Care/advanced ADL retraining;Therapeutic Activities;UE/LE Coordination activities;Cognitive remediation/compensation;Disease mangement/prevention;Functional mobility training;Patient/family education;Skin care/wound managment;Therapeutic Exercise;Visual/perceptual remediation/compensation;Community reintegration;DME/adaptive equipment instruction;Neuromuscular re-education;Psychosocial support;Splinting/orthotics;UE/LE Strength taining/ROM;Wheelchair propulsion/positioning OT Self Feeding Anticipated Outcome(s): Supervision OT Basic Self-Care Anticipated Outcome(s): min A OT Toileting Anticipated Outcome(s): min A OT Bathroom Transfers Anticipated Outcome(s): min A OT Recommendation Recommendations for Other Services: Therapeutic Recreation consult Therapeutic Recreation Interventions: Pet therapy Patient destination: Home Follow Up Recommendations: Home health  OT Equipment Recommended: To be determined   OT Evaluation Precautions/Restrictions  Precautions Precautions: Fall;Other (comment) Restrictions Weight Bearing Restrictions Per Provider Order: No General Chart Reviewed: Yes Additional Pertinent History: HIV maintained on Dovato , CKD stage IIIa with baseline creatinine 1.3-1.5, CAD/MI/CABG 2015 followed by Dr. Swaziland, severe ischemic cardiomyopathy, compensated chronic systolic and diastolic congestive heart failure status post ICD, history of NSVT, hyperlipidemia, hypertension Family/Caregiver Present: No Vital Signs Therapy Vitals Pulse Rate: 80 BP: 96/82 Pain Pain Assessment Pain Scale: 0-10 Pain Score: 0-No pain Home Living/Prior Functioning Home Living Living Arrangements: Spouse/significant other Available Help at Discharge: Family, Available 24 hours/day Type of Home: House Home Access: Stairs to enter [employer] of Steps: 2 Entrance Stairs-Rails: Right  Home Layout: One level Bathroom Shower/Tub: Engineer, manufacturing systems: Standard Bathroom Accessibility: Yes IADL History Homemaking Responsibilities: Yes Meal Prep Responsibility: Secondary Laundry Responsibility: Secondary Cleaning Responsibility: Secondary Bill Paying/Finance Responsibility: Secondary Shopping Responsibility: Secondary Child Care Responsibility: No Homemaking Comments: Pt's wife assists with IADLs Current License: Yes Mode of Transportation: Car Occupation: Retired, On disability 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: enjoys watching sports Prior Function Level of Independence: Independent with basic ADLs, Independent with transfers, Independent with gait  Able to Take Stairs?: Yes Driving: Yes Vocation: On disability Vision Baseline Vision/History: 0 No visual deficits (cataract repaired) Ability to See in Adequate Light: 0 Adequate Patient Visual Report: No change from baseline Vision  Assessment?: Yes Eye Alignment: Impaired (comment) (Gaze Right) Ocular Range of Motion: Restricted on the left Alignment/Gaze Preference: Gaze right;Head turned Tracking/Visual Pursuits: Decreased smoothness of horizontal tracking;Decreased smoothness of vertical tracking Saccades: Decreased speed of saccadic movement;Additional head turns occurred during testing Convergence: Impaired (comment) (R eye was slow to converge, no double vision reported) Visual Fields: No apparent deficits Additional Comments: L inattention, eyes will cross mdline with max cues to complete, dereased ability to complete horizontl Perception  Perception: Within Functional Limits Perception-Other Comments: L inattention Praxis Praxis: Impaired Praxis Impairment Details: Motor planning;Organization;Initiation Cognition Cognition Overall Cognitive Status: Impaired/Different from baseline (will continue to assess) Arousal/Alertness: Awake/alert Orientation Level: Person;Situation;Place Person: Oriented Place: Oriented Situation: Oriented Memory: Impaired Memory Impairment: Storage deficit;Retrieval deficit Attention: Sustained;Focused Focused Attention: Appears intact Sustained Attention: Impaired Awareness: Impaired Awareness Impairment: Emergent impairment Problem Solving: Impaired Executive Function:  (all impaired for BADLs) Safety/Judgment: Impaired Brief Interview for Mental Status (BIMS) Repetition of Three Words (First Attempt): 3 Temporal Orientation: Year: Correct Temporal Orientation: Month: Accurate within 5 days Temporal Orientation: Day: Correct Recall: "Sock": Yes, no cue required Recall: "Blue": Yes, no cue required Recall: "Bed": No, could not recall BIMS Summary Score: 13 Sensation Sensation Light Touch: Impaired Detail Light Touch Impaired Details: Impaired LUE;Impaired LLE Hot/Cold: Not tested Proprioception: Impaired Detail Proprioception Impaired Details: Impaired  LUE;Impaired LLE Stereognosis: Not tested Coordination Gross Motor Movements are Fluid and Coordinated: No Fine Motor Movements are Fluid and Coordinated: No Coordination and Movement Description: limited by L hemiplegia with decreased trunk control and L inattention Finger Nose Finger Test: unable to complete on L d/t hemiparesis Motor  Motor Motor: Hemiplegia Motor - Skilled Clinical Observations: L U/LE hemiplegia with decreased midline orientation, decreased trunk control, and L inattention  Trunk/Postural Assessment  Cervical Assessment Cervical Assessment: Exceptions to [facility] (forward head) Thoracic Assessment Thoracic Assessment: Exceptions to [facility] (rounded shoulders) Lumbar Assessment Lumbar Assessment: Exceptions to [facility] (posterior pelvic tilt) Postural Control Postural Control: Deficits on evaluation Head Control: significantly decreased Trunk Control: significantly decreased  Balance Balance Balance Assessed: Yes Static Sitting Balance Static Sitting - Balance Support: Feet supported;Right upper extremity supported Static Sitting - Level of Assistance: 3: Mod assist;2: Max assist Static Sitting - Comment/# of Minutes: able to initermittently maintain sitting balance with min A for ~10 sec with R UE support, however quickly fatigues requiring up to max A Dynamic Sitting Balance Dynamic Sitting - Balance Support: Feet supported;Right upper extremity supported Dynamic Sitting - Level of Assistance: 1: +1 Total assist Static Standing Balance Static Standing - Balance Support: Right upper extremity supported (L UE supported by therapist) Static Standing - Level of Assistance: 1: +2 Total assist Static Standing - Comment/# of Minutes: mod A +2 in STEDY during BADLs Extremity/Trunk Assessment RUE Assessment RUE Assessment: Within  Functional Limits LUE Assessment LUE Assessment: Exceptions to [facility] General Strength Comments: 0/5 overall except at deltoid/biceps 1/5 LUE  Tone LUE Tone Comments: significantly decreased tone throughout  Care Tool Care Tool Self Care Eating        Oral Care  Oral care, brush teeth, clean dentures activity did not occur: Refused      Bathing   Body parts bathed by patient: Face Body parts bathed by helper: Left lower leg;Right lower leg;Right upper leg;Left upper leg;Buttocks;Front perineal area;Abdomen;Chest;Left arm;Right arm   Assist Level: 2 Helpers    Upper Body Dressing(including orthotics)   What is the patient wearing?: Pull over shirt   Assist Level: Total Assistance - Patient < 25%    Lower Body Dressing (excluding footwear)   What is the patient wearing?: Pants;Underwear/pull up Assist for lower body dressing: 2 Helpers    Putting on/Taking off footwear   What is the patient wearing?: Socks Assist for footwear: Dependent - Patient 0%       Care Tool Toileting Toileting activity   Assist for toileting: 2 Helpers (simulated)     Care Tool Bed Mobility Roll left and right activity   Roll left and right assist level: Maximal Assistance - Patient 25 - 49%    Sit to lying activity   Sit to lying assist level: 2 Helpers    Lying to sitting on side of bed activity   Lying to sitting on side of bed assist level: the ability to move from lying on the back to sitting on the side of the bed with no back support.: Dependent - Patient 0%     Care Tool Transfers Sit to stand transfer   Sit to stand assist level: 2 Helpers    Chair/bed transfer   Chair/bed transfer assist level: 2 Software engineer transfer   Assist Level: 2 Helpers     Care Tool Cognition  Expression of Ideas and Wants Expression of Ideas and Wants: 3. Some difficulty - exhibits some difficulty with expressing needs and ideas (e.g, some words or finishing thoughts) or speech is not clear  Understanding Verbal and Non-Verbal Content Understanding Verbal and Non-Verbal Content: 3. Usually understands - understands most  conversations, but misses some part/intent of message. Requires cues at times to understand   Memory/Recall Ability Memory/Recall Ability : That he or she is in a hospital/hospital unit   Refer to Care Plan for Long Term Goals  SHORT TERM GOAL WEEK 1 OT Short Term Goal 1 (Week 1): Pt will maintain sitting balance min A during BADLs OT Short Term Goal 2 (Week 1): Pt will complete UB bathing with mod A using hemi techniques PRN OT Short Term Goal 3 (Week 1): Pt will donn shirt with mod A using hemi-techniques PRN OT Short Term Goal 4 (Week 1): Pt will tolerate sitting up in wc between therapy sessions 5/7 days OT Short Term Goal 5 (Week 1): Pt will complete toilet transfers with max A x1  Recommendations for other services: Therapeutic Recreation  Pet therapy   Skilled Therapeutic Intervention Skilled Therapeutic Interventions/Progress Updates 1:1 OT evaluation and intervention initiated with skilled education provided on OT role, goals, and POC. Pt received deeply sleeping in bed, waking upon OT arrival. Pt presenting to be fatigued, receptive to skilled OT session reporting 0/10 pain- OT offering intermittent rest breaks, repositioning, and therapeutic support to optimize participation in therapy session. Pt completed BADLs at levels listed below this session with increased time and  rest breaks required d/t decreased activity tolerance. Pt presenting with L inattention, midline orientation deficits, L hemiplegia, decreased trunk control, cognitive deficits, and visual deficits impacting his independence in BADLs. +2 A required for safety during session to complete STEDY transfers and BADLs EOB. Pt would benefit from continued OT services. Pt was left resting in bed with call bell in reach, bed alarm on, and all needs met.   ADL ADL Eating: Maximal assistance Where Assessed-Eating: Bed level Grooming: Maximal assistance Where Assessed-Grooming: Bed level Upper Body Bathing: Maximal  assistance;Dependent Where Assessed-Upper Body Bathing: Bed level Lower Body Bathing: Dependent Where Assessed-Lower Body Bathing: Bed level Upper Body Dressing: Maximal assistance Where Assessed-Upper Body Dressing: Edge of bed Lower Body Dressing: Dependent Where Assessed-Lower Body Dressing: Edge of bed Toileting: Dependent (simulated d/t no ned for BM or void) Where Assessed-Toileting: Bedside Commode Toilet Transfer: Dependent (STEDY) Toilet Transfer Method: Other (comment) (STEDY) Tub/Shower Transfer: Not assessed Walk-In Shower Transfer: Not assessed Mobility  Bed Mobility Bed Mobility: Rolling Right;Rolling Left;Supine to Sit;Sit to Supine Rolling Right: Total Assistance - Patient < 25% Rolling Left: Moderate Assistance - Patient 50-74% Supine to Sit: Total Assistance - Patient < 25% Sit to Supine: 2 Helpers Transfers Sit to Stand: 2 Helpers (using STEDY) Stand to Sit: 2 Helpers (using STEDY)   Discharge Criteria: Patient will be discharged from OT if patient refuses treatment 3 consecutive times without medical reason, if treatment goals not met, if there is a change in medical status, if patient makes no progress towards goals or if patient is discharged from hospital.  The above assessment, treatment plan, treatment alternatives and goals were discussed and mutually agreed upon: by patient  Geoffery Kiel 12/07/2023, 12:36 PM

## 2023-12-07 NOTE — Progress Notes (Signed)
 Inpatient Rehabilitation  Patient information reviewed and entered into eRehab system by [facility]. Karen Kays., CCC/SLP, PPS Coordinator.  Information including medical coding, functional ability and quality indicators will be reviewed and updated through discharge.

## 2023-12-07 NOTE — Patient Care Conference (Signed)
 Inpatient RehabilitationTeam Conference and Plan of Care Update Date: 12/07/2023   Time: 10:42 AM    Patient Name: Richard [name]      Medical Record Number: [id number]  Date of Birth: 09/21/1955 Sex: Male         Room/Bed: 4W25C/4W25C-01 Payor Info: Payor: [employer] / Plan: [facility] / Product Type: *No Product type* /    Admit Date/Time:  12/06/2023  1:00 PM  Primary Diagnosis:  Right middle cerebral artery stroke [facility])  Hospital Problems: Principal Problem:   Right middle cerebral artery stroke [facility])    Expected Discharge Date: Expected Discharge Date:  (evals pending)  Team Members Present: Physician leading conference: Dr. Janeece Mechanic Social Worker Present: Adrianna Albee, LCSW Nurse Present: Forrestine Ike, RN PT Present: Other (comment) Linward Rick, PT) OT Present: Kenda Paula, OT SLP Present: Reggie Caper, SLP PPS Coordinator present : Jestine Moron, SLP     Current Status/Progress Goal Weekly Team Focus  Bowel/Bladder      Constipation addressed, incontinent of bladder    Manage bowel w mod I assist and bladder w toileting schedule    Toileting protocol, medications for constipation  Swallow/Nutrition/ Hydration   pending eval           ADL's   evaluation pending            Mobility   PT eval pending - 1pm on 4/30           Communication   pending eval            Safety/Cognition/ Behavioral Observations  pending eval            Pain      Itching of face and hands; no pain     Pain < 4 with prns    Assess pain q shift  Skin      N/a           Discharge Planning:  New evaluation home with wife who does work but can take off and/or can get aide via [place]. Await team's evaluations   Team Discussion: Patient post right MCA CVA with complex medical history including MD, CABG, Defibrillator placement, now with dense embolic CVA, left inattention, and field cut with problem solving and memory  deficits.  Functional progress limited by slow initiation, ? Narcolepsy, right pusher.   Patient on target to meet rehab goals: Evals pending  *See Care Plan and progress notes for long and short-term goals.   Revisions to Treatment Plan:  Changed to 15/7 therapy schedule   Teaching Needs: Safety, medications, dietary modifications, transfers, toileting, etc.   Current Barriers to Discharge: Decreased caregiver support and Home enviroment access/layout  Possible Resolutions to Barriers: Family education     Medical Summary Current Status: fatigues easily, hx cardiomyopathy with embolic R MCA infarct with L HP  Barriers to Discharge: Cardiac Complications;Renal Insufficiency/Failure;Uncontrolled Hypertension   Possible Resolutions to [employer] Focus: reassess swallow   Continued Need for Acute Rehabilitation Level of Care: The patient requires daily medical management by a physician with specialized training in physical medicine and rehabilitation for the following reasons: Direction of a multidisciplinary physical rehabilitation program to maximize functional independence : Yes Medical management of patient stability for increased activity during participation in an intensive rehabilitation regime.: Yes Analysis of laboratory values and/or radiology reports with any subsequent need for medication adjustment and/or medical intervention. : Yes   I attest that I was present, lead the team conference, and concur  with the assessment and plan of the team.   [name] 12/07/2023, 1:22 PM

## 2023-12-07 NOTE — Plan of Care (Signed)
  Problem: Consults Goal: RH STROKE PATIENT EDUCATION Description: See Patient Education module for education specifics  Outcome: Progressing   Problem: RH BOWEL ELIMINATION Goal: RH STG MANAGE BOWEL WITH ASSISTANCE Description: STG Manage Bowel with mod I Assistance. Outcome: Progressing Goal: RH STG MANAGE BOWEL W/MEDICATION W/ASSISTANCE Description: STG Manage Bowel with Medication with mod I Assistance. Outcome: Progressing   Problem: RH BLADDER ELIMINATION Goal: RH STG MANAGE BLADDER WITH ASSISTANCE Description: STG Manage Bladder With toileting Assistance Outcome: Progressing   Problem: RH SAFETY Goal: RH STG ADHERE TO SAFETY PRECAUTIONS W/ASSISTANCE/DEVICE Description: STG Adhere to Safety Precautions With cues Assistance/Device. Outcome: Progressing   Problem: RH PAIN MANAGEMENT Goal: RH STG PAIN MANAGED AT OR BELOW PT'S PAIN GOAL Description: < 4 with prns Outcome: Progressing   Problem: RH KNOWLEDGE DEFICIT Goal: RH STG INCREASE KNOWLEDGE OF DIABETES Description: Patient and spouse will be able to manage DM using educational resources for medications and dietary modification independently Outcome: Progressing Goal: RH STG INCREASE KNOWLEDGE OF HYPERTENSION Description: Patient and spouse will be able to manage HTN using educational resources for medications and dietary modification independently Outcome: Progressing Goal: RH STG INCREASE KNOWLEDGE OF DYSPHAGIA/FLUID INTAKE Description: Patient and spouse will be able to manage Dysphagia using educational resources for medications and dietary modification independently Outcome: Progressing Goal: RH STG INCREASE KNOWLEGDE OF HYPERLIPIDEMIA Description: Patient and spouse will be able to manage HLD using educational resources for medications and dietary modification independently Outcome: Progressing Goal: RH STG INCREASE KNOWLEDGE OF STROKE PROPHYLAXIS Description: Patient and spouse will be able to manage secondary  risks using educational resources for medications and dietary modification independently Outcome: Progressing

## 2023-12-07 NOTE — Plan of Care (Signed)
  Problem: RH Swallowing Goal: LTG Patient will consume least restrictive diet using compensatory strategies with assistance (SLP) Description: LTG:  Patient will consume least restrictive diet using compensatory strategies with assistance (SLP) Flowsheets (Taken 12/07/2023 1030) LTG: Pt Patient will consume least restrictive diet using compensatory strategies with assistance of (SLP): Supervision   Problem: RH Expression Communication Goal: LTG Patient will increase speech intelligibility (SLP) Description: LTG: Patient will increase speech intelligibility at word/phrase/conversation level with cues, % of the time (SLP) Flowsheets (Taken 12/07/2023 1030) LTG: Patient will increase speech intelligibility (SLP): Supervision Level: Conversation level Percent of time patient will use intelligible speech: 90   Problem: RH Problem Solving Goal: LTG Patient will demonstrate problem solving for (SLP) Description: LTG:  Patient will demonstrate problem solving for basic/complex daily situations with cues  (SLP) Flowsheets (Taken 12/07/2023 1030) LTG: Patient will demonstrate problem solving for (SLP): Basic daily situations LTG Patient will demonstrate problem solving for: Supervision   Problem: RH Memory Goal: LTG Patient will use memory compensatory aids to (SLP) Description: LTG:  Patient will use memory compensatory aids to recall biographical/new, daily complex information with cues (SLP) Flowsheets (Taken 12/07/2023 1030) LTG: Patient will use memory compensatory aids to (SLP): Supervision   Problem: RH Attention Goal: LTG Patient will demonstrate this level of attention during functional activites (SLP) Description: LTG:  Patient will will demonstrate this level of attention during functional activites (SLP) Flowsheets (Taken 12/07/2023 1030) Patient will demonstrate during cognitive/linguistic activities the attention type of: Sustained LTG: Patient will demonstrate this level of attention  during cognitive/linguistic activities with assistance of (SLP): Supervision Number of minutes patient will demonstrate attention during cognitive/linguistic activities: 15   Problem: RH Awareness Goal: LTG: Patient will demonstrate awareness during functional activites type of (SLP) Description: LTG: Patient will demonstrate awareness during functional activites type of (SLP) Flowsheets (Taken 12/07/2023 1030) Patient will demonstrate during cognitive/linguistic activities awareness type of: Emergent LTG: Patient will demonstrate awareness during cognitive/linguistic activities with assistance of (SLP): Supervision

## 2023-12-07 NOTE — Progress Notes (Signed)
 PROGRESS NOTE   Subjective/Complaints:  Pt very fatigued after OT eval, discussed swallow prec with SLP, fluctuations may be related to fatigue, FEES reported as normal   ROS- Objective:   No results found. Recent Labs    12/06/23 0534 12/07/23 0505  WBC 5.6 5.4  HGB 14.3 14.8  HCT 40.9 42.8  PLT 151 162   Recent Labs    12/06/23 0534 12/07/23 0505  NA 135 136  K 4.0 4.4  CL 104 103  CO2 21* 23  GLUCOSE 96 98  BUN 16 22  CREATININE 1.41* 1.62*  CALCIUM  9.2 9.4    Intake/Output Summary (Last 24 hours) at 12/07/2023 0910 Last data filed at 12/07/2023 0800 Gross per 24 hour  Intake 60 ml  Output 850 ml  Net -790 ml        Physical Exam: Vital Signs Blood pressure (P) 95/74, pulse (P) 79, temperature (P) 98.2 F (36.8 C), temperature source (P) Oral, resp. rate (P) 16, height 5\' 10"  (1.778 m), weight 73.9 kg, SpO2 (P) 100%.  General: No acute distress Mood and affect are appropriate Heart: Regular rate and rhythm no rubs murmurs or extra sounds Lungs: Clear to auscultation, breathing unlabored, no rales or wheezes Abdomen: Positive bowel sounds, soft nontender to palpation, nondistended Extremities: No clubbing, cyanosis, or edema Skin: No evidence of breakdown, no evidence of rash Neurologic: Cranial nerves II through XII intact, motor strength is 5/5 in right and 0/5 left  deltoid, bicep, tricep, grip, hip flexor, knee extensors, ankle dorsiflexor and plantar flexor Sensory exam normal sensation to light touch right side and LLE absent LT LUE but feels pinch  Cerebellar exam unable to perform on left due to weakness  Musculoskeletal: Full range of motion in all 4 extremities. No joint swelling    Assessment/Plan: 1. Functional deficits which require 3+ hours per day of interdisciplinary therapy in a comprehensive inpatient rehab setting. Physiatrist is providing close team supervision and 24 hour  management of active medical problems listed below. Physiatrist and rehab team continue to assess barriers to discharge/monitor patient progress toward functional and medical goals  Care Tool:  Bathing              Bathing assist       Upper Body Dressing/Undressing Upper body dressing        Upper body assist      Lower Body Dressing/Undressing Lower body dressing            Lower body assist       Toileting Toileting    Toileting assist       Transfers Chair/bed transfer  Transfers assist           Locomotion Ambulation   Ambulation assist              Walk 10 feet activity   Assist           Walk 50 feet activity   Assist           Walk 150 feet activity   Assist           Walk 10 feet on uneven surface  activity   Assist  Wheelchair     Assist               Wheelchair 50 feet with 2 turns activity    Assist            Wheelchair 150 feet activity     Assist          Blood pressure (P) 95/74, pulse (P) 79, temperature (P) 98.2 F (36.8 C), temperature source (P) Oral, resp. rate (P) 16, height 5\' 10"  (1.778 m), weight 73.9 kg, SpO2 (P) 100%.  Medical Problem List and Plan: 1. 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             -patient may  shower             -ELOS/Goals: 14-18 days Min A Admit to CIR for PT, OT and SLP Severe CHF, change to 15/7 , very fatigued after OT eval this am  Team conference today please see physician documentation under team conference tab, met with team  to discuss problems,progress, and goals. Formulized individual treatment plan based on medical history, underlying problem and comorbidities.  2.  Antithrombotics: -DVT/anticoagulation: Eliquis             -antiplatelet therapy: Plavix  75 mg daily 3. Pain Management:  Tylenol  as needed 4. Mood/Behavior/Sleep: Provide emotional support             -antipsychotic agents: N/A 5. Neuropsych/cognition: This patient is capable of making decisions on his own behalf. 6. Skin/Wound Care: Routine skin checks 7. Fluids/Electrolytes/Nutrition: Routine in and outs with follow-up chemistries 8.  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  Followed by heart failure team.  Monitor for any signs of fluid overload.  Toprol  12.5 mg daily.  Monitor for any hypotension Vitals:   12/06/23 2004 12/07/23 0459  BP: 106/70 (P) 95/74  Pulse: 81 (P) 79  Resp: 16 (P) 16  Temp: 98.3 F (36.8 C) (P) 98.2 F (36.8 C)  SpO2: 99% (P) 100%    9.  CAD/MI/CABG.  Follow-up cardiology services.  Currently maintained on aspirin  and Plavix  10.  VT.  Last episode noted 2023 with successful ATP. 11.  CKD stage IIIA=  Baseline creatinine 1.3-1.5.  Follow-up chemistries, 4/30 creat creeping up associated with low intake and good output , enc po fluids, avoid IV if possible , no diuretics to adjust  12.  HIV.  Continue Dovato - followed at the [facility] 13.  Hyperlipidemia.  Lipitor/Zetia  14.  GERD.  Pepcid  15.  Constipation.  MiraLAX daily, Senokot as needed- no BM in 4 days- wants intervention tomorrow if doesn't go 16.  Dysphagia.  Patient notes coughing on current diet of mechanical soft.  Will  change to Dysphagia 2 and add nectar liquids and have speech therapy follow-up.  May be fluctuating with LOA  17. Daily HA's since stroke- tylenol  is working- gets up to 3-4/10- might need topamax for prevention        LOS: 1 days A FACE TO FACE EVALUATION WAS PERFORMED  Richard [name] 12/07/2023, 9:10 AM

## 2023-12-07 NOTE — Discharge Summary (Signed)
 Physician Discharge Summary  Patient ID: Richard [name] MRN: [id number] DOB/AGE: 1955-08-30 68 y.o.  Admit date: 12/06/2023 Discharge date: 01/05/2024  Discharge Diagnoses:  Principal Problem:   Right middle cerebral artery stroke [name]) DVT prophylaxis Acute on chronic biventricular systolic heart failure due to severe ischemic cardiomyopathy CAD/MI/CABG VT CKD stage III HIV Hyperlipidemia GERD Dysphagia Constipation  Discharged Condition: Stable  Significant Diagnostic Studies: DG Swallowing Func-Speech Pathology Result Date: 12/08/2023 Table formatting from the original result was not included. Modified Barium Swallow Study Patient Details Name: Richard [name] MRN: [id number] Date of Birth: 07/27/1956 Today's Date: 12/08/2023 HPI/PMH: HPI: Richard [name] is a 68 y.o. 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  Patient was last seen in his usual state of health last night at 6 PM when his wife came to the hospital to see a family member.  This morning, she called her husband and he initially did not answer but when she called again, she noticed that his speech was slurred and he asked her to come home because something was not right.  When his wife got home, she found the patient on the floor with left-sided weakness, left-sided facial droop, and slurred speech with complaints of a severe headache prompting EMS activation.  On EMS arrival, patient's symptoms were persistent and a code stroke was activated. Clinical Impression: Patient presents with mild oropharyngeal dysphagia. Oral phase is characterized by reduced lingual movement and labial seal resulting in anterior and posterior spillage. Patient  independently utilized multiple swallows and digital sweep when necessary to clear oral residuals. Pharyngeal phase is remarkable for reduced epiglottic inversion and pharyngeal stripping wave resulting in mild pharyngeal residuals, mainly present in the vallecula and pyriform sinuses. Penetration present x2 with thin liquids (x1 via straw, x1 during liquid wash), though likely due to premature spillage and mistiming of epiglottic inversion and laryngeal vestibular closure. Pill consumed in puree, requiring additional bites of PO due to suspected esophageal retention. Recommend continuation of D2/thin liquid diet with small, single sips of liquids. Patient should continue to utilize digital sweep to clear buccal pocketing of solids. Recommend medications administered whole in puree with additional bites to promote esophogeal clearance. DIGEST Swallow Severity Rating*  Safety: 1  Efficiency: 0  Overall Pharyngeal Swallow Severity: 1 mild 1: mild; 2: moderate; 3: severe; 4: profound *The Dynamic Imaging Grade of Swallowing Toxicity is standardized for the head and neck cancer population, however, demonstrates promising clinical applications across populations to standardize the clinical rating of pharyngeal swallow safety and severity. Factors that may increase risk of adverse event in presence of aspiration Roderick Civatte & Jessy Morocco 2021): Factors that may increase risk of adverse event in presence of aspiration Roderick Civatte & Jessy Morocco 2021): Limited mobility Recommendations/Plan: Swallowing Evaluation Recommendations Swallowing Evaluation Recommendations Recommendations: PO diet PO Diet Recommendation: Dysphagia 2 (Finely chopped); Thin liquids (Level 0) Liquid Administration via: Cup; No straw Medication Administration: Whole meds with puree Supervision: Full supervision/cueing for swallowing strategies Swallowing strategies  : Slow rate; Small bites/sips; Check for pocketing or oral holding; Minimize environmental distractions;  Check for anterior loss Postural changes: Position pt fully upright for meals Oral care recommendations: Oral care BID (2x/day) Treatment Plan Treatment Plan Treatment recommendations: Therapy as outlined in treatment plan below Follow-up recommendations:  Home health SLP Recommendations Comment: n/a Functional status assessment: Patient has had a recent decline in their functional status and demonstrates the ability to make significant improvements in function in a reasonable and predictable amount of time. Treatment frequency: Min 2x/week Treatment duration: 2 weeks Interventions: Aspiration precaution training; Compensatory techniques; Patient/family education; Trials of upgraded texture/liquids; Respiratory muscle strength training Recommendations Recommendations for follow up therapy are one component of a multi-disciplinary discharge planning process, led by the attending physician.  Recommendations may be updated based on patient status, additional functional criteria and insurance authorization. Assessment: Orofacial Exam: Orofacial Exam Oral Cavity: Oral Hygiene: WFL Oral Cavity - Dentition: Edentulous; Dentures, top Orofacial Anatomy: WFL Oral Motor/Sensory Function: Suspected cranial nerve impairment (moderate oral weakness,  reduced lingal and facial ROM and asymmetry) Anatomy: Anatomy: Other (Comment) (diffuse calcifications) Boluses Administered: Boluses Administered Boluses Administered: Thin liquids (Level 0); Puree; Solid  Oral Impairment Domain: Oral Impairment Domain Lip Closure: Escape from interlabial space or lateral juncture, no extension beyond vermillion border Tongue control during bolus hold: Posterior escape of less than half of bolus (oral residuals) Bolus preparation/mastication: Disorganized chewing/mashing with solid pieces of bolus unchewed Bolus transport/lingual motion: Repetitive/disorganized tongue motion Oral residue: Residue collection on oral structures Location of oral residue  : Lateral sulci; Tongue Initiation of pharyngeal swallow : Valleculae (x2 penetration before swallow initiation)  Pharyngeal Impairment Domain: Pharyngeal Impairment Domain Soft palate elevation: No bolus between soft palate (SP)/pharyngeal wall (PW) Laryngeal elevation: Complete superior movement of thyroid  cartilage with complete approximation of arytenoids to epiglottic petiole Anterior hyoid excursion: Complete anterior movement Epiglottic movement: Partial inversion Laryngeal vestibule closure: Complete, no air/contrast in laryngeal vestibule Pharyngeal stripping wave : Present - diminished Pharyngoesophageal segment opening: Partial distention/partial duration, partial obstruction of flow Tongue base retraction: Trace column of contrast or air between tongue base and PPW Pharyngeal residue: Collection of residue within or on pharyngeal structures Location of pharyngeal residue: Valleculae; Pyriform sinuses  Esophageal Impairment Domain: Esophageal Impairment Domain Esophageal clearance upright position: Esophageal retention Pill: Pill Consistency administered: Puree Puree: Impaired (see clinical impressions) Penetration/Aspiration Scale Score: Penetration/Aspiration Scale Score 1.  Material does not enter airway: Puree; Solid; Pill 3.  Material enters airway, remains ABOVE vocal cords and not ejected out: Thin liquids (Level 0) Compensatory Strategies: Compensatory Strategies Compensatory strategies: Yes Straw: Ineffective Ineffective Straw: Thin liquid (Level 0) Multiple swallows: Effective Effective Multiple Swallows: Thin liquid (Level 0)   General Information: Caregiver present: No  Diet Prior to this Study: Dysphagia 2 (finely chopped); Thin liquids (Level 0)   No data recorded  No data recorded  Supplemental O2: None (Room air)   No data recorded Behavior/Cognition: Cooperative; Alert; Pleasant mood Self-Feeding Abilities: Needs assist with self-feeding No data recorded No data recorded Volitional  Swallow: Able to elicit Exam Limitations: No limitations Goal Planning: Prognosis for improved oropharyngeal function: Good No data recorded No data recorded No data recorded Consulted and agree with results and recommendations: Patient Pain: No data recorded End of Session: Start Time:No data recorded Stop Time: No data recorded Time Calculation:No data recorded Charges: No data recorded SLP visit diagnosis: SLP Visit Diagnosis: Dysarthria and anarthria (R47.1); Attention and concentration deficit; Dysphagia, oropharyngeal phase (R13.12) Past Medical History: Past Medical History: Diagnosis Date  Coronary artery disease   a. cath 04/02/2014 3v dx 80-90% in D1, 80-90% mid LAD, 95% apical LAD, 95% distal inferior LV branch of LCx, total occlusion of prox RCA  b). Cath 04/03/2014 DES to mid LAD, DES x3 to prox  D1, DAPT indefinitely. LCx lesion untreated  GERD (gastroesophageal reflux disease)   HIV (human immunodeficiency virus infection) (HCC)   Hypertension   Ischemic cardiomyopathy 08/09/2013  EF 20-25 percent by echo in 04/2014, s/p [employer] Fort[employer]ura model [id number] serial number [id number]  Stroke [facility])  Past Surgical History: Past Surgical History: Procedure Laterality Date  CARDIAC CATHETERIZATION  04/02/2014  DR Loetta Ringer  ESOPHAGOGASTRODUODENOSCOPY (EGD) WITH PROPOFOL  N/A 09/08/2018  Procedure: ESOPHAGOGASTRODUODENOSCOPY (EGD) WITH PROPOFOL ;  Surgeon: Alvis Jourdain, MD;  Location: [facility] ENDOSCOPY;  Service: Endoscopy;  Laterality: N/A;  IMPLANTABLE CARDIOVERTER DEFIBRILLATOR IMPLANT N/A 09/10/2014  Procedure: IMPLANTABLE CARDIOVERTER DEFIBRILLATOR IMPLANT;  Surgeon: Luana Rumple, MD;  [name] [place] model [id number] serial number [id number]  IR CT HEAD LTD  12/01/2023  IR PERCUTANEOUS ART THROMBECTOMY/INFUSION INTRACRANIAL INC DIAG ANGIO  12/01/2023  IR US  GUIDE VASC ACCESS RIGHT  12/01/2023  LEFT AND RIGHT HEART CATHETERIZATION WITH CORONARY ANGIOGRAM N/A 04/02/2014  Procedure: LEFT AND RIGHT HEART  CATHETERIZATION WITH CORONARY ANGIOGRAM;  Surgeon: Millicent Ally, MD;  Location: [name] C[facility] LAB;  Service: Cardiovascular;  Laterality: N/A;  MEDIAL COLLATERAL LIGAMENT REPAIR, KNEE Right 08/25/2015  Procedure: RIGHT THUMB RADICAL COLLATERAL LIGAMENT REPAIR;  Surgeon: Florida Hurter, MD;  Location: [facility] OR;  Service: Orthopedics;  Laterality: Right;  PERCUTANEOUS CORONARY STENT INTERVENTION (PCI-S) N/A 04/03/2014  Procedure: PERCUTANEOUS CORONARY STENT INTERVENTION (PCI-S);  Surgeon: Peter M Swaziland, MD;  Location: [facility] CATH LAB;  Service: Cardiovascular;  Laterality: N/A;  RADIOLOGY WITH ANESTHESIA N/A 12/01/2023  Procedure: RADIOLOGY WITH ANESTHESIA;  Surgeon: Radiologist, Medication, MD;  Location: [facility] OR;  Service: Radiology;  Laterality: N/A;  Juana Nones DILATION N/A 09/08/2018  Procedure: SAVORY DILATION;  Surgeon: Alvis Jourdain, MD;  Location: [facility] ENDOSCOPY;  Service: Endoscopy;  Laterality: N/A; Cassidi F Sockwell 12/08/2023, 9:59 AM  ECHOCARDIOGRAM COMPLETE BUBBLE STUDY Result Date: 12/02/2023    ECHOCARDIOGRAM REPORT   Patient Name:   EMILE KYLLO Ly Date of Exam: 12/02/2023 Medical Rec #:  [id number]           Height:       70.0 in Accession #:    [id number]          Weight:       173.3 lb Date of Birth:  04/24/1956           BSA:          1.964 m Patient Age:    68 years            BP:           107/69 mmHg Patient Gender: M                   HR:           85 bpm. Exam Location:  Inpatient Procedure: 2D Echo, Cardiac Doppler and Color Doppler (Both Spectral and Color            Flow Doppler were utilized during procedure). Indications:    Stroke  History:        Patient has prior history of Echocardiogram examinations, most                 recent 03/03/2023. Cardiomyopathy and CHF, CAD, Abnormal ECG and                 Defibrillator, Stroke, Arrythmias:VT, Signs/Symptoms:Altered                 Mental Status; Risk Factors:Hypertension and Dyslipidemia.  Sonographer:  Raynelle Callow RDCS Referring Phys:  [id number] Richard [name] IMPRESSIONS  1. Left ventricular ejection fraction, by estimation, is 20 to 25%. The left ventricle has severely decreased function. The left ventricle demonstrates global hypokinesis. The left ventricular internal cavity size was mildly dilated. There is mild concentric left ventricular hypertrophy. Left ventricular diastolic parameters are consistent with Grade I diastolic dysfunction (impaired relaxation).  2. Right ventricular systolic function is moderately reduced. The right ventricular size is mildly enlarged. The estimated right ventricular systolic pressure is 16.5 mmHg.  3. Left atrial size was mildly dilated.  4. The mitral valve is normal in structure. Moderate mitral valve regurgitation. No evidence of mitral stenosis.  5. The aortic valve is tricuspid. There is mild calcification of the aortic valve. Aortic valve regurgitation is not visualized. No aortic stenosis is present.  6. The inferior vena cava is normal in size with greater than 50% respiratory variability, suggesting right atrial pressure of 3 mmHg.  7. Agitated saline contrast bubble study was negative, with no evidence of any interatrial shunt. FINDINGS  Left Ventricle: Left ventricular ejection fraction, by estimation, is 20 to 25%. The left ventricle has severely decreased function. The left ventricle demonstrates global hypokinesis. The left ventricular internal cavity size was mildly dilated. There is mild concentric left ventricular hypertrophy. Left ventricular diastolic parameters are consistent with Grade I diastolic dysfunction (impaired relaxation). Right Ventricle: The right ventricular size is mildly enlarged. No increase in right ventricular wall thickness. Right ventricular systolic function is moderately reduced. The tricuspid regurgitant velocity is 1.84 m/s, and with an assumed right atrial pressure of 3 mmHg, the estimated right ventricular systolic pressure is 16.5 mmHg. Left Atrium: Left atrial  size was mildly dilated. Right Atrium: Right atrial size was normal in size. Pericardium: There is no evidence of pericardial effusion. Mitral Valve: The mitral valve is normal in structure. Moderate mitral valve regurgitation. No evidence of mitral valve stenosis. Tricuspid Valve: The tricuspid valve is normal in structure. Tricuspid valve regurgitation is trivial. Aortic Valve: The aortic valve is tricuspid. There is mild calcification of the aortic valve. Aortic valve regurgitation is not visualized. No aortic stenosis is present. Pulmonic Valve: The pulmonic valve was normal in structure. Pulmonic valve regurgitation is trivial. Aorta: The aortic root is normal in size and structure. Venous: The inferior vena cava is normal in size with greater than 50% respiratory variability, suggesting right atrial pressure of 3 mmHg. IAS/Shunts: No atrial level shunt detected by color flow Doppler. Agitated saline contrast bubble study was negative, with no evidence of any interatrial shunt. Additional Comments: A device lead is visualized in the right ventricle.  LEFT VENTRICLE PLAX 2D LVIDd:         6.20 cm      Diastology LVIDs:         5.70 cm      LV e' medial:    4.24 cm/s LV PW:         1.40 cm      LV E/e' medial:  11.1 LV IVS:        1.20 cm      LV e' lateral:   4.90 cm/s LVOT diam:     2.20 cm      LV E/e' lateral: 9.6 LV SV:         66 LV SV Index:   33 LVOT Area:     3.80 cm  LV Volumes (MOD) LV vol d, MOD A2C: 187.0 ml LV vol d, MOD  A4C: 177.0 ml LV vol s, MOD A2C: 115.0 ml LV vol s, MOD A4C: 131.0 ml LV SV MOD A2C:     72.0 ml LV SV MOD A4C:     177.0 ml LV SV MOD BP:      57.6 ml RIGHT VENTRICLE            IVC RV S prime:     6.96 cm/s  IVC diam: 1.10 cm TAPSE (M-mode): 1.2 cm LEFT ATRIUM           Index        RIGHT ATRIUM           Index LA diam:      2.20 cm 1.12 cm/m   RA Area:     10.90 cm LA Vol (A2C): 25.8 ml 13.14 ml/m  RA Volume:   23.40 ml  11.92 ml/m LA Vol (A4C): 37.6 ml 19.15 ml/m  AORTIC  VALVE LVOT Vmax:   101.00 cm/s LVOT Vmean:  63.900 cm/s LVOT VTI:    0.173 m  AORTA Ao Root diam: 3.70 cm MITRAL VALVE                TRICUSPID VALVE MV Area (PHT): 3.99 cm     TR Peak grad:   13.5 mmHg MV Decel Time: 190 msec     TR Vmax:        184.00 cm/s MV E velocity: 47.20 cm/s MV A velocity: 111.00 cm/s  SHUNTS MV E/A ratio:  0.43         Systemic VTI:  0.17 m                             Systemic Diam: 2.20 cm Dalton McleanMD Electronically signed by Richard [name] Signature Date/Time: 12/02/2023/2:08:55 PM    Final    MR BRAIN WO CONTRAST Result Date: 12/02/2023 CLINICAL DATA:  Stroke workup EXAM: MRI HEAD WITHOUT CONTRAST TECHNIQUE: Multiplanar, multiecho pulse sequences of the brain and surrounding structures were obtained without intravenous contrast. COMPARISON:  Head CT and CTA from yesterday FINDINGS: Brain: Patchy acute infarction in the right MCA distribution along the temporal cortex/white matter, posterior insula (which is thin and may have been previously affected by infarct), and posterior striatum/corona radiata. New acute hemorrhage, hydrocephalus, mass, or collection. Vascular: Absent right MCA flow void at the level of the lower insula, noted angiographic findings. Absent left ICA flow void with intracranial normalization. Skull and upper cervical spine: Normal marrow signal Sinuses/Orbits: Negative IMPRESSION: Moderate acute infarction in the right MCA distribution. Slow or absent flow in the right MCA beginning at the lower sylvian fissure. 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 Electronically Signed   By: Ronnette Coke M.D.   On: 12/02/2023 11:26   IR PERCUTANEOUS ART THROMBECTOMY/INFUSION INTRACRANIAL INC DIAG ANGIO Result Date: 12/01/2023 INDICATION: 69 year old male with acute ischemic stroke and symptoms of left-sided weakness, dysphagia, and facial droop. Patient was not a candidate for thrombolytic therapy. Patient qualifies as an extended window  stroke and after a multidisciplinary review, interventional therapy was offered. Patient presents for stroke thrombectomy. EXAM: 1. Catheterization of the right common carotid artery with selective arteriogram) 2. Catheterization of the right internal carotid artery with selective arteriogram 3. Suction thrombectomy of right M1 segment. 4. Stent retriever assisted suction thrombectomy of the right M2 and M1 segments 5. Selective catheterization of the right M2 segment with selective arteriogram 6. Cone beam CT for  treatment planning 7. Ultrasound guidance for vascular access COMPARISON:  CT angiogram performed 12/01/2023 MEDICATIONS: Heparin  5000 units intravenously was administered during the procedure. ANESTHESIA/SEDATION: General anesthesia CONTRAST:  80 cc of Omnipaque  300 FLUOROSCOPY TIME:  Fluoroscopy Time: 18.3 minutes (1050 mGy). COMPLICATIONS: None immediate. TECHNIQUE: Following a full explanation of the procedure along with the potential associated complications, an informed witnessed consent was obtained. The risks of intracranial hemorrhage , worsening neurological deficit, ventilator dependency, death and inability to revascularize were all reviewed in detail with the patient's wife. The patient was then put under general anesthesia by the Department of Anesthes[facility] at [facility]. The right groin was prepped and draped in the usual sterile fashion. Ultrasound was used to study the right common femoral artery. The artery is patent. A hard copy image ultrasound was saved and stored in PACs. Using real-time ultrasound guidance, a 21 gauge needle was advanced into the right common femoral artery. Access was obtained at 1122. Using Seldinger technique, a 6 French sheath was placed in the descending thoracic aorta. Next, selective catheterization of the right common carotid artery is performed. A selective arteriogram demonstrated occlusion of the distal M1 segment. There was no significant flow  limiting stenosis in the proximal internal carotid artery based on NASCET criteria. Next, the diagnostic catheter was advanced into the right internal carotid artery and a selective arteriogram was performed. This confirmed occlusion of the distal right M1 segment. Next, a penumbra 43 suction catheter with coaxial penumbra 68 catheter were advanced into the distal M1 segment and suction thrombectomy was performed. First pass was performed at 11:37. A follow-up angiogram demonstrated modest improvement with removal of a short segment of thromboembolic disease in the distal M1 segment. Of note, robust collateralization to the MCA branches was present. Next, I elected to repeat suction thrombectomy with the coaxial penumbra system at 11:47 a.m. A third pass was performed using the penumbra system at 11:55. Given the modest improvement at this point, I elected to perform a pass at 12:03 using EMBO trap device. The M2 segment was selectively catheterized and the EMBO trap device was deployed. Recanalization was not achieved at this point and therefore an additional stent retriever pass was performed at 12:14. Repeat deployment of the EMBO trap devise in the right M2 segment extending into the distal M1 segment was again performed at 12:24. At this point, multiple oblique images were obtained and in the occlusive lesion was profiled. When compared to the initial arteriogram, there was modest improvement in appearance of the distal M1 stump. There was no evidence of abnormal extravasation. There was robust collateral formation to the MCA territory branches which suggest the possibility of some underlying chronic disease (the left ICA is also occluded chronically). Catheters and guidewires were removed at this point. Evaluation of the femoral access site was performed to evaluate for closure device. An Angio-Seal device was deployed without complication. Next, a cone beam CT was performed to evaluate for treatment planning.  This demonstrated residual contrast agent within the vascular system. Note were the were areas of atherosclerotic plaque within the distal right M1 segment. There was opacification of the right M2 branches. There was no evidence abnormal extravasation to indicate significant intracranial hemorrhage. The patient was then removed from general anesthesia and transferred to the recovery area in stable condition. FINDINGS: 1. Occlusive disease within the distal right M1 segment with robust collateralization. Pretreatment TICI score 2A 2. Approximately 6 suction and stent retriever passes were performed in the right M2  segment/distal M1 segment with modest improvement of the distal right M1 stump. The angiographic appearance suggests some underlying chronic arterial occlusive disease. 3. Post treatment TICI score 2A PROCEDURE: Detailed above. IMPRESSION: 1. Endovascular thrombectomy of right M1 occlusive disease with modest improvement in flow. 2. Intra procedural CT scan demonstrated no evidence of significant intracranial hemorrhage. PLAN: 1. To PACU in stable condition. 2. Postoperative BP parameters discussed with the neurology team (permissive HTN, goal 130-180). 3. Please call with questions or concerns. Electronically Signed   By: Reagan Camera M.D.   On: 12/01/2023 14:39   IR CT Head Ltd Result Date: 12/01/2023 INDICATION: 68 year old male with acute ischemic stroke and symptoms of left-sided weakness, dysphagia, and facial droop. Patient was not a candidate for thrombolytic therapy. Patient qualifies as an extended window stroke and after a multidisciplinary review, interventional therapy was offered. Patient presents for stroke thrombectomy. EXAM: 1. Catheterization of the right common carotid artery with selective arteriogram) 2. Catheterization of the right internal carotid artery with selective arteriogram 3. Suction thrombectomy of right M1 segment. 4. Stent retriever assisted suction thrombectomy of the  right M2 and M1 segments 5. Selective catheterization of the right M2 segment with selective arteriogram 6. Cone beam CT for treatment planning 7. Ultrasound guidance for vascular access COMPARISON:  CT angiogram performed 12/01/2023 MEDICATIONS: Heparin  5000 units intravenously was administered during the procedure. ANESTHESIA/SEDATION: General anesthesia CONTRAST:  80 cc of Omnipaque  300 FLUOROSCOPY TIME:  Fluoroscopy Time: 18.3 minutes (1050 mGy). COMPLICATIONS: None immediate. TECHNIQUE: Following a full explanation of the procedure along with the potential associated complications, an informed witnessed consent was obtained. The risks of intracranial hemorrhage , worsening neurological deficit, ventilator dependency, death and inability to revascularize were all reviewed in detail with the patient's wife. The patient was then put under general anesthesia by the Department of Anesthesiology at [facility]. The right groin was prepped and draped in the usual sterile fashion. Ultrasound was used to study the right common femoral artery. The artery is patent. A hard copy image ultrasound was saved and stored in PACs. Using real-time ultrasound guidance, a 21 gauge needle was advanced into the right common femoral artery. Access was obtained at 1122. Using Seldinger technique, a 6 French sheath was placed in the descending thoracic aorta. Next, selective catheterization of the right common carotid artery is performed. A selective arteriogram demonstrated occlusion of the distal M1 segment. There was no significant flow limiting stenosis in the proximal internal carotid artery based on NASCET criteria. Next, the diagnostic catheter was advanced into the right internal carotid artery and a selective arteriogram was performed. This confirmed occlusion of the distal right M1 segment. Next, a penumbra 43 suction catheter with coaxial penumbra 68 catheter were advanced into the distal M1 segment and suction  thrombectomy was performed. First pass was performed at 11:37. A follow-up angiogram demonstrated modest improvement with removal of a short segment of thromboembolic disease in the distal M1 segment. Of note, robust collateralization to the MCA branches was present. Next, I elected to repeat suction thrombectomy with the coaxial penumbra system at 11:47 a.m. A third pass was performed using the penumbra system at 11:55. Given the modest improvement at this point, I elected to perform a pass at 12:03 using EMBO trap device. The M2 segment was selectively catheterized and the EMBO trap device was deployed. Recanalization was not achieved at this point and therefore an additional stent retriever pass was performed at 12:14. Repeat deployment of the EMBO trap devise  in the right M2 segment extending into the distal M1 segment was again performed at 12:24. At this point, multiple oblique images were obtained and in the occlusive lesion was profiled. When compared to the initial arteriogram, there was modest improvement in appearance of the distal M1 stump. There was no evidence of abnormal extravasation. There was robust collateral formation to the MCA territory branches which suggest the possibility of some underlying chronic disease (the left ICA is also occluded chronically). Catheters and guidewires were removed at this point. Evaluation of the femoral access site was performed to evaluate for closure device. An Angio-Seal device was deployed without complication. Next, a cone beam CT was performed to evaluate for treatment planning. This demonstrated residual contrast agent within the vascular system. Note were the were areas of atherosclerotic plaque within the distal right M1 segment. There was opacification of the right M2 branches. There was no evidence abnormal extravasation to indicate significant intracranial hemorrhage. The patient was then removed from general anesthesia and transferred to the recovery  area in stable condition. FINDINGS: 1. Occlusive disease within the distal right M1 segment with robust collateralization. Pretreatment TICI score 2A 2. Approximately 6 suction and stent retriever passes were performed in the right M2 segment/distal M1 segment with modest improvement of the distal right M1 stump. The angiographic appearance suggests some underlying chronic arterial occlusive disease. 3. Post treatment TICI score 2A PROCEDURE: Detailed above. IMPRESSION: 1. Endovascular thrombectomy of right M1 occlusive disease with modest improvement in flow. 2. Intra procedural CT scan demonstrated no evidence of significant intracranial hemorrhage. PLAN: 1. To PACU in stable condition. 2. Postoperative BP parameters discussed with the neurology team (permissive HTN, goal 130-180). 3. Please call with questions or concerns. Electronically Signed   By: Reagan Camera M.D.   On: 12/01/2023 14:39   IR US  Guide Vasc Access Right Result Date: 12/01/2023 INDICATION: 68 year old male with acute ischemic stroke and symptoms of left-sided weakness, dysphagia, and facial droop. Patient was not a candidate for thrombolytic therapy. Patient qualifies as an extended window stroke and after a multidisciplinary review, interventional therapy was offered. Patient presents for stroke thrombectomy. EXAM: 1. Catheterization of the right common carotid artery with selective arteriogram) 2. Catheterization of the right internal carotid artery with selective arteriogram 3. Suction thrombectomy of right M1 segment. 4. Stent retriever assisted suction thrombectomy of the right M2 and M1 segments 5. Selective catheterization of the right M2 segment with selective arteriogram 6. Cone beam CT for treatment planning 7. Ultrasound guidance for vascular access COMPARISON:  CT angiogram performed 12/01/2023 MEDICATIONS: Heparin  5000 units intravenously was administered during the procedure. ANESTHESIA/SEDATION: General anesthesia CONTRAST:  80  cc of Omnipaque  300 FLUOROSCOPY TIME:  Fluoroscopy Time: 18.3 minutes (1050 mGy). COMPLICATIONS: None immediate. TECHNIQUE: Following a full explanation of the procedure along with the potential associated complications, an informed witnessed consent was obtained. The risks of intracranial hemorrhage , worsening neurological deficit, ventilator dependency, death and inability to revascularize were all reviewed in detail with the patient's wife. The patient was then put under general anesthesia by the Department of Anesthesiology at [name]. The right groin was prepped and draped in the usual sterile fashion. Ultrasound was used to study the right common femoral artery. The artery is patent. A hard copy image ultrasound was saved and stored in PACs. Using real-time ultrasound guidance, a 21 gauge needle was advanced into the right common femoral artery. Access was obtained at 1122. Using Seldinger technique, a 6 French sheath was  placed in the descending thoracic aorta. Next, selective catheterization of the right common carotid artery is performed. A selective arteriogram demonstrated occlusion of the distal M1 segment. There was no significant flow limiting stenosis in the proximal internal carotid artery based on NASCET criteria. Next, the diagnostic catheter was advanced into the right internal carotid artery and a selective arteriogram was performed. This confirmed occlusion of the distal right M1 segment. Next, a penumbra 43 suction catheter with coaxial penumbra 68 catheter were advanced into the distal M1 segment and suction thrombectomy was performed. First pass was performed at 11:37. A follow-up angiogram demonstrated modest improvement with removal of a short segment of thromboembolic disease in the distal M1 segment. Of note, robust collateralization to the MCA branches was present. Next, I elected to repeat suction thrombectomy with the coaxial penumbra system at 11:47 a.m. A third pass was  performed using the penumbra system at 11:55. Given the modest improvement at this point, I elected to perform a pass at 12:03 using EMBO trap device. The M2 segment was selectively catheterized and the EMBO trap device was deployed. Recanalization was not achieved at this point and therefore an additional stent retriever pass was performed at 12:14. Repeat deployment of the EMBO trap devise in the right M2 segment extending into the distal M1 segment was again performed at 12:24. At this point, multiple oblique images were obtained and in the occlusive lesion was profiled. When compared to the initial arteriogram, there was modest improvement in appearance of the distal M1 stump. There was no evidence of abnormal extravasation. There was robust collateral formation to the MCA territory branches which suggest the possibility of some underlying chronic disease (the left ICA is also occluded chronically). Catheters and guidewires were removed at this point. Evaluation of the femoral access site was performed to evaluate for closure device. An Angio-Seal device was deployed without complication. Next, a cone beam CT was performed to evaluate for treatment planning. This demonstrated residual contrast agent within the vascular system. Note were the were areas of atherosclerotic plaque within the distal right M1 segment. There was opacification of the right M2 branches. There was no evidence abnormal extravasation to indicate significant intracranial hemorrhage. The patient was then removed from general anesthesia and transferred to the recovery area in stable condition. FINDINGS: 1. Occlusive disease within the distal right M1 segment with robust collateralization. Pretreatment TICI score 2A 2. Approximately 6 suction and stent retriever passes were performed in the right M2 segment/distal M1 segment with modest improvement of the distal right M1 stump. The angiographic appearance suggests some underlying chronic  arterial occlusive disease. 3. Post treatment TICI score 2A PROCEDURE: Detailed above. IMPRESSION: 1. Endovascular thrombectomy of right M1 occlusive disease with modest improvement in flow. 2. Intra procedural CT scan demonstrated no evidence of significant intracranial hemorrhage. PLAN: 1. To PACU in stable condition. 2. Postoperative BP parameters discussed with the neurology team (permissive HTN, goal 130-180). 3. Please call with questions or concerns. Electronically Signed   By: Reagan Camera M.D.   On: 12/01/2023 14:39   CT ANGIO HEAD NECK W WO CM W PERF (CODE STROKE) Result Date: 12/01/2023 CLINICAL DATA:  Code stroke, neuro deficit, left-sided weakness, left sensory deficit, left face weakness and or dysarthria. EXAM: CT ANGIOGRAPHY HEAD AND NECK CT PERFUSION BRAIN TECHNIQUE: Multidetector CT imaging of the head and neck was performed using the standard protocol during bolus administration of intravenous contrast. Multiplanar CT image reconstructions and MIPs were obtained to evaluate the vascular  anatomy. Carotid stenosis measurements (when applicable) are obtained utilizing NASCET criteria, using the distal internal carotid diameter as the denominator. Multiphase CT imaging of the brain was performed following IV bolus contrast injection. Subsequent parametric perfusion maps were calculated using RAPID software. RADIATION DOSE REDUCTION: This exam was performed according to the departmental dose-optimization program which includes automated exposure control, adjustment of the mA and/or kV according to patient size and/or use of iterative reconstruction technique. CONTRAST:  OMNIPAQUE  IOHEXOL  350 MG/ML SOLN COMPARISON:  Same-day head CT. FINDINGS: CTA NECK FINDINGS Aortic arch: Common origin of the brachiocephalic and left common carotid arteries. Imaged portion shows no evidence of aneurysm or dissection. No significant stenosis of the major arch vessel origins. Pulmonary arteries: As permitted by  contrast timing, there are no filling defects in the visualized pulmonary arteries. Subclavian arteries: The subclavian arteries are patent bilaterally. Right carotid system: No evidence of dissection, stenosis (50% or greater), or occlusion. Retropharyngeal course of the proximal cervical ICA. Left carotid system: Patent from the origin to the carotid bifurcation. Abrupt occlusion of the proximal cervical ICA. External carotid artery branches are patent. Vertebral arteries: Codominant. No evidence of dissection, stenosis (50% or greater), or occlusion. Skeleton: No acute or aggressive findings. Mild degenerative changes in the cervical spine. Additional is maxilla and mandible. Dental implants noted. Other neck: The visualized airway is patent. No cervical lymphadenopathy. Upper chest: Partially visualized left chest wall pacer device. Partially visualized right pleural effusion. Review of the MIP images confirms the above findings CTA HEAD FINDINGS ANTERIOR CIRCULATION: The left ICA is occluded to the level of the supraclinoid segment. There is reconstitution of the supraclinoid left ICA with diminished intraluminal contrast. Moderate narrowing of the left ICA just proximal to the ICA terminus. Reconstitution of the left ACA and left MCA likely via the circle-of-Willis. The right ICA is patent from the skull base to the ICA terminus. Mild atherosclerosis of the right carotid siphon without significant stenosis. MCAs: The right M1 segment is occluded proximally with intraluminal soft tissue concerning for thrombus. There is also occlusion of a proximal M2 branch of the right MCA with likely intraluminal thrombus. Reconstitution of M2 superior division branches. There is additional reconstitution of multiple M2 inferior division branches some of which demonstrate diminished intraluminal contrast. ACAs: The anterior cerebral arteries are patent bilaterally. POSTERIOR CIRCULATION: No significant stenosis, proximal  occlusion, aneurysm, or vascular malformation. PCAs: The posterior cerebral arteries are patent bilaterally. Pcomm: The posterior communicating arteries are visualized bilaterally. SCAs: The superior cerebellar arteries are patent bilaterally. Basilar artery: Patent AICAs: Patent PICAs: Patent Vertebral arteries: The intracranial vertebral arteries are patent. Venous sinuses: As permitted by contrast timing, patent. Anatomic variants: None Review of the MIP images confirms the above findings CT Brain Perfusion Findings: ASPECTS: 8 CBF (<30%) Volume: 0mL Perfusion (Tmax>6.0s) volume: Mismatch Volume: Infarction Location:No core infarct identified. Elevated T-max throughout the right MCA territory concerning for ischemic changes. IMPRESSION: Occlusion of the right M1 segment as well as a proximal M2 branch of the right MCA with intraluminal thrombus noted. Reconstitution of multiple M2 branches of the right MCA. Several M2 inferior division branches demonstrate diminished intraluminal contrast. Diffuse region of elevated T-max throughout the right MCA territory concerning for ischemic changes without evidence of core infarct. 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 Reconstitution of the supraclinoid left ICA as well as the left ACA and left MCA via the circle-of-Willis. Small  right pleural effusion. 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 Doretta Gant At 10:57 am on 12/01/2023 by text page via the [name] messaging system. Electronically Signed   By: Denny Flack M.D.   On: 12/01/2023 11:09   CT HEAD CODE STROKE WO CONTRAST Result Date: 12/01/2023 CLINICAL DATA:  Code stroke.  Neuro deficit, concern for stroke. EXAM: CT HEAD WITHOUT CONTRAST TECHNIQUE: Contiguous axial images were obtained from the base of the skull through the vertex without intravenous  contrast. RADIATION DOSE REDUCTION: This exam was performed according to the departmental dose-optimization program which includes automated exposure control, adjustment of the mA and/or kV according to patient size and/or use of iterative reconstruction technique. COMPARISON:  MRI head 04/02/2007. FINDINGS: Brain: No acute intracranial hemorrhage. Encephalomalacia involving the right frontal operculum and superior right temporal lobe as well as the right insula compatible with prior infarct. There is subtle hypoattenuation in the right caudate nucleus and right lentiform nuclei which may reflect acute infarct. No significant edema or midline shift. The basilar cisterns are patent. The posterior fossa is unremarkable. Ventricles: The ventricles are normal. Vascular: Asymmetric dense appearance of the right M1 segment and possibly involving an M2 branch of the right MCA. Atherosclerosis of the carotid siphons. Skull: No acute or aggressive finding. Orbits: Orbits are symmetric. Sinuses: Mild mucosal thickening in the ethmoid sinuses, sphenoid sinuses, and partially visualized right maxillary sinus. Mucosal thickening with near complete opacification of the left frontal sinus. Other: Mastoid air cells are clear. ASPECTS [name] Stroke Program Early CT Score) - Ganglionic level infarction (caudate, lentiform nuclei, internal capsule, insula, M1-M3 cortex): 5 - Supraganglionic infarction (M4-M6 cortex): 3 Total score (0-10 with 10 being normal): 8 IMPRESSION: 1. Subtle hypoattenuation involving the right caudate and right lentiform nuclei which may reflect acute infarct. 2. No acute intracranial hemorrhage. 3. Remote infarct involving the right frontal operculum, insula, and superior right temporal lobe. 4. Asymmetric density of the right M1 segment. Recommend correlation with CTA. 5. ASPECTS is 8 These results were communicated to Dr. Doretta Gant At 10:50 am on 12/01/2023 by text page via the [facility] messaging system.  Electronically Signed   By: Denny Flack M.D.   On: 12/01/2023 10:50    Labs:  Basic Metabolic Panel: Recent Labs  Lab 12/19/23 0523  NA 139  K 4.0  CL 108  CO2 21*  GLUCOSE 87  BUN 17  CREATININE 1.51*  CALCIUM  8.9    CBC: Recent Labs  Lab 12/19/23 0523  WBC 7.0  NEUTROABS 4.2  HGB 11.9*  HCT 35.0*  MCV 82.2  PLT 162     CBG: No results for input(s): "GLUCAP" in the last 168 hours.  Family history.  Negative for heart disease esophageal cancer or colon cancer  Brief HPI:   Yeiden A Strick is a 68 y.o. right-handed male with history significant for HIV maintained on Dovato , CKD stage III with baseline creatinine 1.3-1.5, CAD/MI/CABG 2015 followed by Dr. 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  Per chart review lives with spouse independent prior to admission 1 level home 2 steps to entry.  Presented 12/01/2023 with left-sided weakness and dysarthria of unclear duration.  CT of the head showed subtle hypoattenuation involving the right caudate and right lentiform nuclei.  No acute intracranial hemorrhage.  Remote infarct involving the right frontal operculum insula and superior right temporal lobe.  CTA of the head and neck showed occlusion of the right M1 segment as well as proximal M2 branch of the right MCA and intraluminal thrombus noted.  Reconstitution of multiple M2 branches of the right MCA.  Several M2 inferior division branches demonstrated diminished intraluminal contrast.  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  Reconstitution of the supraclinoid left ICA as well as left ACA and left MCA via the circle of Willis.  Admission chemistries unremarkable except creatinine 1.33 alcohol negative SARS coronavirus negative.  Patient underwent revascularization of right MCA  occlusion per interventional radiology.  MRI follow-up revealed moderate-sized acute infarct in the right MCA distribution.  Echocardiogram with ejection fraction of 20 to 25%.  Left ventricle demonstrating global hypokinesis.  Grade 1 diastolic dysfunction.  Neurology follow-up initially maintained on low-dose aspirin  81 mg daily and Plavix  75 mg daily for CVA prophylaxis and transition to Eliquis  continuing aspirin  with Plavix  discontinued.  Cardiology services continue to follow for acute on chronic biventricular systolic heart failure his Toprol  was resumed and there was some difficulty with tapering of pressors with bouts of hypotension.  Therapy evaluations completed due to patient decreased functional mobility was admitted for a comprehensive rehab program.   Hospital Course: Aristeo A Peterka was admitted to rehab 12/06/2023 for inpatient therapies to consist of PT, ST and OT at least three hours five days a week. Past admission physiatrist, therapy team and rehab RN have worked together to provide customized collaborative inpatient rehab.  Pertaining to patient's right MCA territory infarction with right M1 and M2 occlusion status post revascularization per radiology.  He would follow-up neurology services as well as interventional radiology currently maintained on Eliquis  as well as aspirin .  Acute on chronic biventricular systolic heart failure due to severe ischemic cardiomyopathy followed by heart failure team monitoring for any signs of fluid overload.   Monitoring for any hypotension.  History of CAD with MI CABG followed by cardiology services no chest pain shortness of breath.  Patient had been maintained on low-dose metoprolol  discontinued as blood pressure remained soft and mild bradycardic and monitored.  VT last episode noted 2023 with successful ATP.  CKD stage III baseline creatinine 1.3-1.5 and monitoring of chemistries.  History of HIV continued on Dovato  and follow-up outpatient.   Lipitor and Zetia  ongoing for hyperlipidemia.  He did have intermittent bouts of headache maintained on Topamax .  History of GERD with Pepcid  as advised as well as bouts of constipation resolved with laxative assistance.  Noted question dysphagia initially on mechanical soft diet upon admission to rehab services patient with some choking episodes follow-up per speech therapy diet advance as tolerated.   Blood pressures were monitored on TID basis and remained controlled and monitored     Rehab course: During patient's stay in rehab weekly team conferences were held to monitor patient's progress, set goals and discuss barriers to discharge. At admission, patient required moderate assist side-lying to sitting max assist sit to supine  Physical exam.  Blood pressure 95/74 pulse 76 temperature 99.3 respirations 18 oxygen saturation 97% room air Constitutional.  No acute distress HEENT Head.  Normocephalic and atraumatic.  Left facial droop left tongue deviation Eyes.  Pupils round and reactive to light no discharge without nystagmus Neck.  Supple nontender no JVD without thyromegaly Cardiac regular rate and rhythm without any extra sounds or murmur heard Abdomen.  Soft nontender positive bowel sounds without rebound Respiratory effort normal no  respiratory distress without wheeze Musculoskeletal Comments.  Right upper and right lower extremity 5/5 Left upper extremity deltoids and biceps 1/5 otherwise 0/5 Left lower extremity 1/5 hip flexors and knee extension and PF only otherwise 0/5 Neurologic.  Patient is alert and oriented he is severely dysarthric.  He/She  has had improvement in activity tolerance, balance, postural control as well as ability to compensate for deficits. He/She has had improvement in functional use RUE/LUE  and RLE/LLE as well as improvement in awareness.  He completes bed mobility with minimal assist sit to stand transfers min mod assist to steady handhold and stand pivot  transfers min assist max assist for balance.  Squat pivot transfers performed contact-guard to the right min mod assist to the left.  Ambulate 75 feet using 3 Musketeer support with minimal mod assist.  OT focus sessions on functional movement patterns.  Transition to edge of bed using bed rail with minimal assist.  Patient able to maintain sitting balance with close supervision.  Working with energy conservation techniques.  Patient recalled Hemi dressing technique.  Donned clean shirt with minimal assist overall for left upper extremity positioning and sleep.  Assistance required to cross left lower extremity over to right to weave foot into pants.  Patient stood from edge of bed with right hand-held assist with minimal assist.  SLP follow-up patient was independent oriented to temporal spatial and situational concepts.  SLP addressed any memory and problem-solving through a word list retention/category exclusion task.  Patient instructed in use of repetition strategies with great carryover ultimately completing tasks with 80% accuracy independent improving to 100% with supervision assist.  Full family teaching completed plan discharge to home       Disposition:    Diet: Mechanical soft  Special Instructions: No driving smoking or alcohol  Medications at discharge. 1.  Tylenol  as needed 2.  Eliquis  5 mg p.o. twice daily 3.  Aspirin  81 mg p.o. daily 4.  Lipitor 40 mg p.o. daily 5.  Dovato  50-300 mg 1 tablet daily 6.  Zetia  10 mg p.o. daily 7.  Flonase  1 spray each nostril daily 8.  Topamax  50 mg nightly 9.  MiraLAX  daily hold for loose stools 10.  Voltaren  gel 2 g 4 times daily to affected area 11.  Protonix  40 mg daily    30-35 minutes were spent completing discharge summary and discharge planning  Discharge Instructions     Ambulatory referral to Neurology   Complete by: As directed    An appointment is requested in approximately: 4 weeks right MCA infarction         Follow-up Information     Kirsteins, Cecilia Coe, MD Follow up.   Specialty: Physical Medicine and Rehabilitation Why: Office to call for appointment Contact information: [address] [place] [address] [phone]         Millicent Ally, MD Follow up.   Specialty: Cardiology Why: call for appointment Contact information: [address] [place] [address] [phone]         Nohemi Batters, MD Follow up.   Specialty: Internal Medicine Contact information: [address] [place] [address] [phone]                 Signed: Everlyn Hockey Hillarie Harrigan 12/22/2023, 9:15 AM

## 2023-12-07 NOTE — Progress Notes (Signed)
 Orthopedic Tech Progress Note Patient Details:  Richard [name] [birth year] [id number]  Patient ID: Richard [name], male   DOB: [birth year], 68 y.o.   MRN: [id number]  Called in order w/ [facility] Udell Gamble 12/07/2023, 5:57 PM

## 2023-12-08 ENCOUNTER — Inpatient Hospital Stay (HOSPITAL_COMMUNITY)

## 2023-12-08 DIAGNOSIS — I255 Ischemic cardiomyopathy: Secondary | ICD-10-CM | POA: Diagnosis not present

## 2023-12-08 DIAGNOSIS — I63511 Cerebral infarction due to unspecified occlusion or stenosis of right middle cerebral artery: Secondary | ICD-10-CM | POA: Diagnosis not present

## 2023-12-08 DIAGNOSIS — B20 Human immunodeficiency virus [HIV] disease: Secondary | ICD-10-CM | POA: Diagnosis not present

## 2023-12-08 DIAGNOSIS — I69391 Dysphagia following cerebral infarction: Secondary | ICD-10-CM | POA: Diagnosis not present

## 2023-12-08 LAB — CREATININE, SERUM
Creatinine, Ser: 1.57 mg/dL — ABNORMAL HIGH (ref 0.61–1.24)
GFR, Estimated: 48 mL/min — ABNORMAL LOW (ref >60)

## 2023-12-08 MED ORDER — BISACODYL 10 MG RE SUPP: 10.0000 mg | Freq: Every day | RECTAL | Status: DC | PRN

## 2023-12-08 MED ORDER — SENNOSIDES-DOCUSATE SODIUM 8.6-50 MG PO TABS
1.0000 | ORAL_TABLET | Freq: Two times a day (BID) | ORAL | Status: DC
  Administered 2023-12-08 – 2023-12-13 (×11): 1 via ORAL
  Filled 2023-12-08 (×11): qty 1

## 2023-12-08 MED ORDER — SORBITOL 70 % SOLN
30.0000 mL | Freq: Every day | Status: DC | PRN
  Administered 2023-12-23 – 2024-01-04 (×2): 30 mL via ORAL
  Filled 2023-12-08 (×2): qty 30

## 2023-12-08 NOTE — Procedures (Signed)
 Modified Barium Swallow Study  Patient Details  Name: Richard [name] MRN: [id number] Date of Birth: August 31, 1955  Today's Date: 12/08/2023  Modified Barium Swallow completed.  Full report located under Chart Review in the Imaging Section.  History of Present Illness Richard [name] is a 68 y.o. 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  Patient was last seen in his usual state of health last night at 6 PM when his wife came to the hospital to see a family member.  This morning, she called her husband and he initially did not answer but when she called again, she noticed that his speech was slurred and he asked her to come home because something was not right.  When his wife got home, she found the patient on the floor with left-sided weakness, left-sided facial droop, and slurred speech with complaints of a severe headache prompting EMS activation.  On EMS arrival, patient's symptoms were persistent and a code stroke was activated.   Clinical Impression Patient presents with mild oropharyngeal dysphagia. Oral phase is characterized by reduced lingual movement and labial seal resulting in anterior and posterior spillage. Patient independently utilized multiple swallows and digital sweep when necessary to clear oral residuals. Pharyngeal phase is remarkable for reduced epiglottic inversion and pharyngeal stripping wave resulting in mild pharyngeal residuals, mainly present in the vallecula and pyriform sinuses. Penetration present x2 with thin liquids (x1 via straw, x1 during liquid wash), though likely due to premature spillage and mistiming of epiglottic inversion and laryngeal vestibular closure. Pill consumed in puree,  requiring additional bites of PO due to suspected esophageal retention. Recommend continuation of D2/thin liquid diet with small, single sips of liquids. Patient should continue to utilize digital sweep to clear buccal pocketing of solids. Recommend medications administered whole in puree with additional bites to promote esophogeal clearance.   DIGEST Swallow Severity Rating*  Safety: 1  Efficiency: 0  Overall Pharyngeal Swallow Severity: 1 mild 1: mild; 2: moderate; 3: severe; 4: profound  *The Dynamic Imaging Grade of Swallowing Toxicity is standardized for the head and neck cancer population, however, demonstrates promising clinical applications across populations to standardize the clinical rating of pharyngeal swallow safety and severity.   Factors that may increase risk of adverse event in presence of aspiration Richard [name] & Richard [name] 2021): Limited mobility  Swallow Evaluation Recommendations Recommendations: PO diet PO Diet Recommendation: Dysphagia 2 (Finely chopped);Thin liquids (Level 0) Liquid Administration via: Cup;No straw Medication Administration: Whole meds with puree Supervision: Full supervision/cueing for swallowing strategies Swallowing strategies  : Slow rate;Small bites/sips;Check for pocketing or oral holding;Minimize environmental distractions;Check for anterior loss Postural changes: Position pt fully upright for meals Oral care recommendations: Oral care BID (2x/day)      Richard [name] 12/08/2023,9:50 AM

## 2023-12-08 NOTE — IPOC Note (Signed)
 Overall Plan of Care [facility]) Patient Details Name: Richard [name] MRN: [id number] DOB: Dec 21, 1955  Admitting Diagnosis: Right middle cerebral artery stroke [facility])  Hospital Problems: Principal Problem:   Right middle cerebral artery stroke [facility])     Functional Problem List: Nursing Pain, Safety, Bladder, Bowel, Endurance, Medication Management  PT Balance, Edema, Endurance, Motor, Nutrition, Perception, Safety, Sensory, Skin Integrity  OT Balance, Perception, Safety, Cognition, Sensory, Skin Integrity, Endurance, Vision, Motor, Pain, Edema  SLP Cognition, Nutrition, Linguistic  TR         Basic ADL's: OT Grooming, Bathing, Dressing, Toileting     Advanced  ADL's: OT       Transfers: PT Bed Mobility, Bed to Chair, Car, Occupational psychologist, Research scientist (life sciences): PT Ambulation, Psychologist, prison and probation services, Stairs     Additional Impairments: OT Fuctional Use of Upper Extremity  SLP Swallowing, Social Cognition   Problem Solving, Attention, Memory, Awareness  TR      Anticipated Outcomes Item Anticipated Outcome  Self Feeding Supervision  Swallowing  supervision   Basic self-care  min A  Toileting  min A   Bathroom Transfers min A  Bowel/Bladder  manage bowel w mod I and bladder w toileting  Transfers  MinA  Locomotion  ModA  Communication  supervision  Cognition  supervision  Pain  Pain < 4 with prns  Safety/Judgment  manage safety w cues   Therapy Plan: PT Intensity: Minimum of 1-2 x/day ,45 to 90 minutes PT Frequency: Total of 15 hours over 7 days of combined therapies PT Duration Estimated Length of Stay: 4 weeks OT Intensity: Minimum of 1-2 x/day, 45 to 90 minutes OT Frequency: 5 out of 7 days OT Duration/Estimated Length of Stay: 3.5 weeks SLP Intensity: Minumum of 1-2 x/day, 30 to 90 minutes SLP Frequency: 3 to 5 out of 7 days SLP Duration/Estimated Length of Stay: 12-14 days   Team Interventions: Nursing Interventions Patient/Family  Education, Pain Management, Dysphagia/Aspiration Precaution Training, Bladder Management, Medication Management, Discharge Planning, Bowel Management, Disease Management/Prevention  PT interventions Ambulation/gait training, Cognitive remediation/compensation, Discharge planning, DME/adaptive equipment instruction, Functional mobility training, Pain management, Psychosocial support, Splinting/orthotics, Therapeutic Activities, UE/LE Strength taining/ROM, Visual/perceptual remediation/compensation, Wheelchair propulsion/positioning, UE/LE Coordination activities, Therapeutic Exercise, Stair training, Skin care/wound management, Neuromuscular re-education, Patient/family education, Functional electrical stimulation, Disease management/prevention, Firefighter, Warden/ranger  OT Interventions Warden/ranger, Discharge planning, Functional electrical stimulation, Pain management, Self Care/advanced ADL retraining, Therapeutic Activities, UE/LE Coordination activities, Cognitive remediation/compensation, Disease mangement/prevention, Functional mobility training, Patient/family education, Skin care/wound managment, Therapeutic Exercise, Visual/perceptual remediation/compensation, Firefighter, Fish farm manager, Neuromuscular re-education, Psychosocial support, Splinting/orthotics, UE/LE Strength taining/ROM, Wheelchair propulsion/positioning  SLP Interventions Cognitive remediation/compensation, Financial trader, Dysphagia/aspiration precaution training, Functional tasks, Internal/external aids, Patient/family education, Speech/Language facilitation, Therapeutic Activities  TR Interventions    SW/CM Interventions Discharge Planning, Psychosocial Support, Patient/Family Education   Barriers to Discharge MD  Medical stability  Nursing Home environment access/layout, Decreased caregiver support 1 level 2 ste w spouse  PT Inaccessible home  environment, Decreased caregiver support, Other (comments), Nutrition means (severe hemiplegia)    OT      SLP      SW Insurance for SNF coverage     Team Discharge Planning: Destination: PT-Home ,OT- Home , SLP-Home Projected Follow-up: PT-Home health PT, OT-  Home health OT, SLP-Home Health SLP Projected Equipment Needs: PT-To be determined, OT- To be determined, SLP-None recommended by SLP Equipment Details: PT- , OT-  Patient/family involved in discharge planning: PT- Patient, Family  member/caregiver,  OT-Patient, SLP-Patient  MD ELOS: 21-24d Medical Rehab Prognosis:  Fair Assessment: The patient has been admitted for CIR therapies with the diagnosis of R MCA CVA. The team will be addressing functional mobility, strength, stamina, balance, safety, adaptive techniques and equipment, self-care, bowel and bladder mgt, patient and caregiver education, dysphagia, poor endurance, CHF . Goals have been set at MinA/Sup. Anticipated discharge destination is Home with family support .        See Team Conference Notes for weekly updates to the plan of care

## 2023-12-08 NOTE — Progress Notes (Signed)
 Physical Therapy Session Note  Patient Details  Name: Richard [name] MRN: [id number] Date of Birth: 1955/08/10  Today's Date: 12/08/2023 PT Individual Time: 4098-1191 PT Individual Time Calculation (min): 55 min   Short Term Goals: Week 1:  PT Short Term Goal 1 (Week 1): Pt will perform bed mobility with ModA +2. PT Short Term Goal 2 (Week 1): Pt will maintain seated balance with decreased push of RUE and increased trunk activation to maintain upright position with overall ModA for up to . PT Short Term Goal 3 (Week 1): Pt will perform sit<>stand using STEDY with MinA. PT Short Term Goal 4 (Week 1): Pt will initiate gait training  Skilled Therapeutic Interventions/Progress Updates: Patient supine in bed on bed pan with wife present on entrance to room. Patient alert and agreeable to PT session.   Patient with no complaints of pain. Pt required maxA to roll supine<>R sidelying while PTA adjusted brief and personal pants, (no BM with pt reporting constipation) and modA to roll to the L with multimodal cues for sequence. Pt performed supine<sit EOB with maxA (HOB elevated) and VC for hand placement. Pt required use of board at foot of bed as external target to promote R lateral flexion at trunk to sit. Pt maxA to scoot to EOB and to avoid retropulsion with cues to lean onto PTA shoulder. Pt performed squat pivot tranfers throughout session with mod/maxA and VC for head hip relationship/hand placement. Pt with L sling donned to avoid further subluxation at shoulder. Pt transported to ortho gym in TIS to decrease distractions in environment. Pt sitting edge of mat with cues control posture with mirror feedback. Pt also performed lateral weight shift that required modA to bring trunk back to center. Pt occasionally falling posteriorly onto mat that required maxA to prevent with tactile cuing to utilize trunk flexors to bring self pt to hip flexion. Pt required rest break with totalA to maintain  trunk positioning, then requested back to chair while PTA adjusted sling. Pt participated in muscle energy technique to L quadriceps to increase neuromuscular connection with education provided to both pt and pt wife. Pt participated in static stance with R UE support on railing outside of main gym with modA to stand, and maxA to maintain standing balance with PTA blocking L knee. Pt required seated rest break and returned to room in TIS.  Patient in TIS at end of session with brakes locked, belt alarm set, and all needs within reach.      Therapy Documentation Precautions:  Precautions Precautions: Fall, Other (comment) Precaution/Restrictions Comments: L hemiplegia with flaccidity, R pusher (can correct to pull with vc), dysarthria Restrictions Weight Bearing Restrictions Per Provider Order: No  Therapy/Group: Individual Therapy  Issa Kosmicki PTA 12/08/2023, 2:43 PM

## 2023-12-08 NOTE — Progress Notes (Signed)
 Occupational Therapy Session Note  Patient Details  Name: Richard [name] MRN: [id number] Date of Birth: August 07, 1956  Today's Date: 12/08/2023 OT Individual Time: 5009-3818 OT Individual Time Calculation (min): 58 min  OT Individual Time: 1445-1515 OT Individual Time Calculation (min): 30 min  OT Missed Time: 15 min  Short Term Goals: Week 1:  OT Short Term Goal 1 (Week 1): Pt will maintain sitting balance min A during BADLs OT Short Term Goal 2 (Week 1): Pt will complete UB bathing with mod A using hemi techniques PRN OT Short Term Goal 3 (Week 1): Pt will donn shirt with mod A using hemi-techniques PRN OT Short Term Goal 4 (Week 1): Pt will tolerate sitting up in wc between therapy sessions 5/7 days OT Short Term Goal 5 (Week 1): Pt will complete toilet transfers with max A x1  Skilled Therapeutic Interventions/Progress Updates:     AM Session:  Pt received reclined in bed with nursing staff present in room upon OT arrival. Pt presenting to be fatigued, however in good spirits receptive to skilled OT session reporting 0/10 pain- OT offering intermittent rest breaks, repositioning, and therapeutic support to optimize participation in therapy session. Focused this session on transitional movements, body awareness, transfer training, and L UE NMRE.   Pt able to follow verbal cues for log rolling technique to roll onto L side using bed rail mod A. Brought legs off EOB mod A with education provided on posterior hooking method. Max A required to lift trunk d/t Pt pulling self closer to bed using R vs pushing up to lift trunk. Sitting EOB, spent time working on static sitting balance with max multimodal cues provided for head and trunk positioning- Pt able to maintain sitting balance with R UE supported for min A intermittently, however becomes fatigued extremely quickly requiring max-total A for static balance.   Pt requesting to use restroom for BM. Utilized STEDY for transport to bathroom to  facilitate increased opportunities to work on sit<>stands and standing balance. Provided education on impact of Pt's CVA on his functional status, midline orientation deficits, and lateral pushing in preparation for transfer with Pt receptive to education. Pt was able to complete multiple partial sit<>stands in STEDY with min A +2 and max multimodal cues for midline orientation during toileting tasks. Perches sitting balance maintained with mod-max A and standing balance maintained with max A +max cues during toileting tasks.   Pt noted to have significant L shoulder instability and anterior shoulder subluxation. Provided education on L UE positioning and recovery with emphasis on avoiding injury with Pt receptive to education. Provided education on k-tape and applied k-tape to L shoulder and scapula to increase feedback to L shoulder for improved muscle activation. Utilized sling to support L shoulder during functional transfers and in wc with noted improvement in positioning.   Worked on anterior weight shifting in wc and off loading hips as a preparatory skills for completing squat pivot transfers. Facilitated WB'ing through L LE and decreased L lateral pushing through tactile/verbal cues and visual feedback using mirror. Pt with significant challenge completing this during session and requiring max multimodal cues to correct head/trunk alignment.   Pt was left resting in therapy gym under supervision of therapy staff with all needs met.    PM Session:  Pt received deeply sleeping in wc upon OT arrival. Pt presenting to be fatigued, however in good spirits receptive to skilled OT session reporting 0/10 pain- OT offering intermittent rest breaks, repositioning, and therapeutic support  to optimize participation in therapy session. Pt with nausea episode prior to OT session, reporting decreased nausea symptoms at this time. Provided Pt with warm wash cloth to was face at beginning of session to improve  alertness. Focused this session on transitional movement patterns to improved body awareness. Provided education on technique for completing squat pivot transfers with emphasis on head/hip relationship and anterior weight shifting with demonstration provided. In preparation for transfer, work on anterior weight shifting and off loading buttocks with improvement noted compared to previous session. Pt was able to complete squat pivot to L with max A x1 +max multimodal cues for technique. Max A required for sitting balance EOB. Worked on midline orientation and head/trunk control sitting EOB with Pt instructed to bring trunk to midline- max A required overall even with R UE supported d/t fatigue. Pt noted to be dozing off EOB. Educated Pt in reverse log rolling technique- EOB > supine max A. Spent time at end of session providing education on hemi-positioning in bed to avoid injury to L hemibody and promote improved proprioceptive feedback. Education provided on resting hand splint purpose and wear schedule- donned resting hand splint at end of session. Missed 15 minutes of skilled OT treatment d/t fatigue. Will attempt to make up time as schedule and Pt's status allow. Pt was left resting in bed with call bell in reach, bed alarm on, and all needs met.    Therapy Documentation Precautions:  Precautions Precautions: Fall, Other (comment) Precaution/Restrictions Comments: L hemiplegia with flaccidity, R pusher (can correct to pull with vc), dysarthria Restrictions Weight Bearing Restrictions Per Provider Order: No   Therapy/Group: Individual Therapy  Geoffery Kiel 12/08/2023, 12:51 PM

## 2023-12-08 NOTE — Plan of Care (Signed)
  Problem: RH Balance Goal: LTG Patient will maintain dynamic sitting balance (PT) Description: LTG:  Patient will maintain dynamic sitting balance with assistance during mobility activities (PT) Flowsheets (Taken 12/08/2023 0712) LTG: Pt will maintain dynamic sitting balance during mobility activities with:: Supervision/Verbal cueing Goal: LTG Patient will maintain dynamic standing balance (PT) Description: LTG:  Patient will maintain dynamic standing balance with assistance during mobility activities (PT) Flowsheets (Taken 12/08/2023 0712) LTG: Pt will maintain dynamic standing balance during mobility activities with:: Moderate Assistance - Patient 50 - 74%   Problem: Sit to Stand Goal: LTG:  Patient will perform sit to stand with assistance level (PT) Description: LTG:  Patient will perform sit to stand with assistance level (PT) Flowsheets (Taken 12/08/2023 0712) LTG: PT will perform sit to stand in preparation for functional mobility with assistance level: Contact Guard/Touching assist   Problem: RH Bed Mobility Goal: LTG Patient will perform bed mobility with assist (PT) Description: LTG: Patient will perform bed mobility with assistance, with/without cues (PT). Flowsheets (Taken 12/08/2023 0712) LTG: Pt will perform bed mobility with assistance level of: Contact Guard/Touching assist   Problem: RH Bed to Chair Transfers Goal: LTG Patient will perform bed/chair transfers w/assist (PT) Description: LTG: Patient will perform bed to chair transfers with assistance (PT). Flowsheets (Taken 12/08/2023 0712) LTG: Pt will perform Bed to Chair Transfers with assistance level: Minimal Assistance - Patient > 75%   Problem: RH Car Transfers Goal: LTG Patient will perform car transfers with assist (PT) Description: LTG: Patient will perform car transfers with assistance (PT). Flowsheets (Taken 12/08/2023 0712) LTG: Pt will perform car transfers with assist:: Minimal Assistance - Patient > 75%    Problem: RH Ambulation Goal: LTG Patient will ambulate in controlled environment (PT) Description: LTG: Patient will ambulate in a controlled environment, # of feet with assistance (PT). Flowsheets (Taken 12/08/2023 0712) LTG: Pt will ambulate in controlled environ  assist needed:: Moderate Assistance - Patient 50 - 74% LTG: Ambulation distance in controlled environment: up to 60 ft using LRAD   Problem: RH Wheelchair Mobility Goal: LTG Patient will propel w/c in controlled environment (PT) Description: LTG: Patient will propel wheelchair in controlled environment, # of feet with assist (PT) Flowsheets (Taken 12/08/2023 0712) LTG: Pt will propel w/c in controlled environ  assist needed:: Supervision/Verbal cueing LTG: Propel w/c distance in controlled environment: up to 100 ft Goal: LTG Patient will propel w/c in home environment (PT) Description: LTG: Patient will propel wheelchair in home environment, # of feet with assistance (PT). Flowsheets (Taken 12/08/2023 0712) LTG: Pt will propel w/c in home environ  assist needed:: Supervision/Verbal cueing Distance: wheelchair distance in controlled environment: 100 LTG: Propel w/c distance in home environment: up to 50 ft

## 2023-12-08 NOTE — Progress Notes (Signed)
 PROGRESS NOTE   Subjective/Complaints: Scheduled for MBS SLept well last noc, clearing throat during breakfast     ROS-denies CP, SOB, N/V/D Objective:   No results found. Recent Labs    12/06/23 0534 12/07/23 0505  WBC 5.6 5.4  HGB 14.3 14.8  HCT 40.9 42.8  PLT 151 162   Recent Labs    12/06/23 0534 12/07/23 0505  NA 135 136  K 4.0 4.4  CL 104 103  CO2 21* 23  GLUCOSE 96 98  BUN 16 22  CREATININE 1.41* 1.62*  CALCIUM  9.2 9.4    Intake/Output Summary (Last 24 hours) at 12/08/2023 0747 Last data filed at 12/08/2023 0110 Gross per 24 hour  Intake 340 ml  Output 550 ml  Net -210 ml        Physical Exam: Vital Signs Blood pressure 110/88, pulse 82, temperature 98.4 F (36.9 C), temperature source Oral, resp. rate 16, height 5\' 10"  (1.778 m), weight 73.9 kg, SpO2 98%.  General: No acute distress Mood and affect are appropriate Heart: Regular rate and rhythm no rubs murmurs or extra sounds Lungs: Clear to auscultation, breathing unlabored, no rales or wheezes Abdomen: Positive bowel sounds, soft nontender to palpation, nondistended Extremities: No clubbing, cyanosis, or edema Skin: No evidence of breakdown, no evidence of rash Neurologic: Cranial nerves II through XII intact, motor strength is 5/5 in right and 0/5 left  deltoid, bicep, tricep, grip, hip flexor, knee extensors, ankle dorsiflexor and plantar flexor Sensory exam normal sensation to light touch right side and LLE absent LT LUE but feels pinch  Cerebellar exam unable to perform on left due to weakness  Musculoskeletal: Full range of motion in all 4 extremities. No joint swelling    Assessment/Plan: 1. Functional deficits which require 3+ hours per day of interdisciplinary therapy in a comprehensive inpatient rehab setting. Physiatrist is providing close team supervision and 24 hour management of active medical problems listed  below. Physiatrist and rehab team continue to assess barriers to discharge/monitor patient progress toward functional and medical goals  Care Tool:  Bathing    Body parts bathed by patient: Face   Body parts bathed by helper: Left lower leg, Right lower leg, Right upper leg, Left upper leg, Buttocks, Front perineal area, Abdomen, Chest, Left arm, Right arm     Bathing assist Assist Level: 2 Helpers     Upper Body Dressing/Undressing Upper body dressing   What is the patient wearing?: Pull over shirt    Upper body assist Assist Level: Total Assistance - Patient < 25%    Lower Body Dressing/Undressing Lower body dressing      What is the patient wearing?: Pants, Underwear/pull up     Lower body assist Assist for lower body dressing: 2 Helpers     Toileting Toileting    Toileting assist Assist for toileting: 2 Helpers (simulated)     Transfers Chair/bed transfer  Transfers assist     Chair/bed transfer assist level: 2 Helpers (STEDY)     Locomotion Ambulation   Ambulation assist   Ambulation activity did not occur: Safety/medical concerns          Walk 10 feet activity  Assist  Walk 10 feet activity did not occur: Safety/medical concerns        Walk 50 feet activity   Assist Walk 50 feet with 2 turns activity did not occur: Safety/medical concerns         Walk 150 feet activity   Assist Walk 150 feet activity did not occur: Safety/medical concerns         Walk 10 feet on uneven surface  activity   Assist Walk 10 feet on uneven surfaces activity did not occur: Safety/medical concerns         Wheelchair     Assist Is the patient using a wheelchair?: Yes Type of Wheelchair: Manual    Wheelchair assist level: Dependent - Patient 0%      Wheelchair 50 feet with 2 turns activity    Assist        Assist Level: Dependent - Patient 0%   Wheelchair 150 feet activity     Assist      Assist Level:  Dependent - Patient 0%   Blood pressure 110/88, pulse 82, temperature 98.4 F (36.9 C), temperature source Oral, resp. rate 16, height 5\' 10"  (1.778 m), weight 73.9 kg, SpO2 98%.  Medical Problem List and Plan: 1. 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             -patient may  shower             -ELOS/Goals: 14-18 days Min A Admit to CIR for PT, OT and SLP Severe CHF, change to 15/7 , very fatigued after OT eval this am   2.  Antithrombotics: -DVT/anticoagulation: Eliquis              -antiplatelet therapy: Plavix  75 mg daily 3. Pain Management: Tylenol  as needed 4. Mood/Behavior/Sleep: Provide emotional support             -antipsychotic agents: N/A 5. Neuropsych/cognition: This patient is capable of making decisions on his own behalf. 6. Skin/Wound Care: Routine skin checks 7. Fluids/Electrolytes/Nutrition: Routine in and outs with follow-up chemistries 8.  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  Followed by heart failure team.  Monitor for any signs of fluid overload.  Toprol  12.5 mg daily.  Monitor for any hypotension Vitals:   12/07/23 2028 12/08/23 0443  BP: 115/86 110/88  Pulse: 85 82  Resp: 16 16  Temp: 98.2 F (36.8 C) 98.4 F (36.9 C)  SpO2: 98%     9.  CAD/MI/CABG.  Follow-up cardiology services.  Currently maintained on aspirin  and Plavix  10.  VT.  Last episode noted 2023 with successful ATP. 11.  CKD stage IIIA=  Baseline creatinine 1.3-1.5.  Follow-up chemistries, 4/30 creat creeping up associated with low intake and good output , enc po fluids, avoid IV if possible , no diuretics to adjust  12.  HIV.  Continue Dovato - followed at the [facility] 13.  Hyperlipidemia.  Lipitor/Zetia  14.  GERD.  Pepcid  15.  Constipation.  MiraLAX  daily, Senokot as needed- no BM in 4 days- wants intervention tomorrow  if doesn't go 16.  Dysphagia.  Patient notes coughing on current diet of mechanical soft.  Dysphagia 2 thin liquids and have speech therapy follow-up.  May be fluctuating with LOA - SLP rec MBS today, FEES looked ok  17. Daily HA's since stroke- tylenol  is working- gets up to 3-4/10- might need topamax for  prevention, no pain today         LOS: 2 days A FACE TO FACE EVALUATION WAS PERFORMED  Richard [name] 12/08/2023, 7:47 AM

## 2023-12-08 NOTE — Evaluation (Signed)
 Physical Therapy Assessment and Plan  Patient Details  Name: Richard [name] MRN: [id number] Date of Birth: 1955-08-13  PT Diagnosis: Difficulty walking, Hemiplegia non-dominant, Hypotonia, Impaired cognition, Impaired sensation, and Muscle weakness Rehab Potential: Fair ELOS: 4 weeks   Today's Date: 12/07/2023 PT Individual Time: 7829-5621 PT Individual Time Calculation (min): 76 min  Hospital Problem: Principal Problem:   Right middle cerebral artery stroke [facility])   Past Medical History:  Past Medical History:  Diagnosis Date   Coronary artery disease    a. cath 04/02/2014 3v dx 80-90% in D1, 80-90% mid LAD, 95% apical LAD, 95% distal inferior LV branch of LCx, total occlusion of prox RCA  b). Cath 04/03/2014 DES to mid LAD, DES x3 to prox D1, DAPT indefinitely. LCx lesion untreated   GERD (gastroesophageal reflux disease)    HIV (human immunodeficiency virus infection) (HCC)    Hypertension    Ischemic cardiomyopathy 08/09/2013   EF 20-25 percent by echo in 04/2014, s/p [employer] Fort[employer]ura model [id number] serial number [id number]   Stroke [facility])    Past Surgical History:  Past Surgical History:  Procedure Laterality Date   CARDIAC CATHETERIZATION  04/02/2014   DR Loetta Ringer   ESOPHAGOGASTRODUODENOSCOPY (EGD) WITH PROPOFOL  N/A 09/08/2018   Procedure: ESOPHAGOGASTRODUODENOSCOPY (EGD) WITH PROPOFOL ;  Surgeon: Alvis Jourdain, MD;  Location: [facility] ENDOSCOPY;  Service: Endoscopy;  Laterality: N/A;   IMPLANTABLE CARDIOVERTER DEFIBRILLATOR IMPLANT N/A 09/10/2014   Procedure: IMPLANTABLE CARDIOVERTER DEFIBRILLATOR IMPLANT;  Surgeon: Luana Rumple, MD;  [facility] [place] model [id number] serial number [id number]   IR CT HEAD LTD  12/01/2023   IR PERCUTANEOUS ART THROMBECTOMY/INFUSION INTRACRANIAL INC DIAG ANGIO  12/01/2023   IR US  GUIDE VASC ACCESS RIGHT  12/01/2023   LEFT AND RIGHT HEART CATHETERIZATION WITH CORONARY ANGIOGRAM N/A 04/02/2014   Procedure: LEFT AND RIGHT HEART CATHETERIZATION  WITH CORONARY ANGIOGRAM;  Surgeon: Millicent Ally, MD;  Location: [facility] CATH LAB;  Service: Cardiovascular;  Laterality: N/A;   MEDIAL COLLATERAL LIGAMENT REPAIR, KNEE Right 08/25/2015   Procedure: RIGHT THUMB RADICAL COLLATERAL LIGAMENT REPAIR;  Surgeon: Florida Hurter, MD;  Location: [facility] OR;  Service: Orthopedics;  Laterality: Right;   PERCUTANEOUS CORONARY STENT INTERVENTION (PCI-S) N/A 04/03/2014   Procedure: PERCUTANEOUS CORONARY STENT INTERVENTION (PCI-S);  Surgeon: Peter M Swaziland, MD;  Location: [facility] CATH LAB;  Service: Cardiovascular;  Laterality: N/A;   RADIOLOGY WITH ANESTHESIA N/A 12/01/2023   Procedure: RADIOLOGY WITH ANESTHESIA;  Surgeon: Radiologist, Medication, MD;  Location: [facility] OR;  Service: Radiology;  Laterality: N/A;  Juana Nones DILATION N/A 09/08/2018   Procedure: SAVORY DILATION;  Surgeon: Alvis Jourdain, MD;  Location: [facility] ENDOSCOPY;  Service: Endoscopy;  Laterality: N/A;    Assessment & Plan Clinical Impression: Patient is a 68 y.o. right-handed male with history significant for HIV maintained on Dovato , CKD stage IIIa with baseline creatinine 1.3-1.5, CAD/MI/CABG 2015 followed by Dr. 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 Per chart review patient lives with spouse. 1 level home 2 steps to enter. Independent prior to admission. Presented 12/01/2023 with left-sided weakness and dysarthria of unclear duration. CT of the head showed subtle hypoattenuation involving the right caudate and right lentiform nuclei. No acute intracranial hemorrhage. Remote infarct involving the right frontal operculum, insula and superior right temporal lobe. CTA of head and neck showed occlusion of the right M1 segment as well as proximal M2 branch of the right MCA and intraluminal thrombus noted.  Reconstitution of multiple M2 branches of the right MCA. Several M2 inferior  division branches demonstrated diminished intraluminal contrast. 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 Reconstitution of the supraclinoid left ICA as well as left ACA and left MCA via the circle of Willis. Admission chemistries unremarkable except creatinine 1.33, alcohol negative, SARS coronavirus negative. Patient underwent revascularization of right MCA occlusion per interventional radiology. MRI follow-up revealed moderate-sized acute infarct in the right MCA distribution. Echocardiogram with ejection fraction of 20 to 25%. The left ventricle demonstrating global hypokinesis. Grade 1 diastolic dysfunction. Neurology follow-up patient initially maintained on low-dose aspirin  81 mg daily and Plavix  75 mg daily for CVA prophylaxis and transition to Eliquis  and continue Plavix  with aspirin  discontinued. Cardiology services continues to follow for acute on chronic biventricular systolic heart failure his Toprol  was resumed there was some difficulty in tapering off pressors with bouts of hypotension. Patient currently on a mechanical soft diet. Therapy evaluations completed due to patient decreased functional mobility left-sided weakness and dysarthria was admitted for a comprehensive rehab program. Patient transferred to CIR on 12/06/2023 .   Patient currently requires total assist with mobility secondary to muscle weakness, decreased cardiorespiratoy endurance, impaired timing and sequencing, abnormal tone, and unbalanced muscle activation, decreased midline orientation, decreased attention to left, left side neglect, and decreased motor planning, decreased attention, decreased awareness, decreased problem solving, decreased safety awareness, decreased memory, and delayed processing, and decreased sitting balance, decreased standing balance, decreased postural control, hemiplegia, and decreased balance strategies.  Prior to hospitalization,  patient was independent  with mobility and lived with Spouse in a House home.  Home access is 2Stairs to enter.  Patient will benefit from skilled PT intervention to maximize safe functional mobility, minimize fall risk, and decrease caregiver burden for planned discharge home with 24 hour assist.  Anticipate patient will benefit from follow up [facility] at discharge.  PT - End of Session Activity Tolerance: Tolerates 30+ min activity with multiple rests;Tolerates 10 - 20 min activity with multiple rests Endurance Deficit: Yes PT Assessment Rehab Potential (ACUTE/IP ONLY): Fair PT Barriers to Discharge: Inaccessible home environment;Decreased caregiver support;Other (comments);Nutrition means (severe hemiplegia) PT Patient demonstrates impairments in the following area(s): Balance;Edema;Endurance;Motor;Nutrition;Perception;Safety;Sensory;Skin Integrity PT Transfers Functional Problem(s): Bed Mobility;Bed to Chair;Car;Furniture PT Locomotion Functional Problem(s): Ambulation;Wheelchair Mobility;Stairs PT Plan PT Intensity: Minimum of 1-2 x/day ,45 to 90 minutes PT Frequency: Total of 15 hours over 7 days of combined therapies PT Duration Estimated Length of Stay: 4 weeks PT Treatment/Interventions: Ambulation/gait training;Cognitive remediation/compensation;Discharge planning;DME/adaptive equipment instruction;Functional mobility training;Pain management;Psychosocial support;Splinting/orthotics;Therapeutic Activities;UE/LE Strength taining/ROM;Visual/perceptual remediation/compensation;Wheelchair propulsion/positioning;UE/LE Coordination activities;Therapeutic Exercise;Stair training;Skin care/wound management;Neuromuscular re-education;Patient/family education;Functional electrical stimulation;Disease management/prevention;Community reintegration;Balance/vestibular training PT Transfers Anticipated Outcome(s): MinA PT Locomotion Anticipated Outcome(s): ModA PT Recommendation Recommendations for Other  Services: Therapeutic Recreation consult Therapeutic Recreation Interventions: Stress management;Kitchen group Follow Up Recommendations: Home health PT Patient destination: Home Equipment Recommended: To be determined   PT Evaluation Precautions/Restrictions Precautions Precautions: Fall;Other (comment) Precaution/Restrictions Comments: L hemiplegia with flaccidity, R pusher (can correct to pull with vc), dysarthria Restrictions Weight Bearing Restrictions Per Provider Order: No General   Vital SignsTherapy Vitals Temp: 98.4 F (36.9 C) Temp Source: Oral Pulse Rate: 82 Resp: 16 BP: 110/88 Patient Position (if appropriate): Lying Pain Pain Assessment Pain Scale: 0-10 Pain Score: 0-No pain Pain Interference Pain Interference Pain Effect on Sleep: 0. Does not apply - I have not had any pain or hurting in the past 5 days Pain  Interference with Therapy Activities: 0. Does not apply - I have not received rehabilitationtherapy in the past 5 days Pain Interference with Day-to-Day Activities: 1. Rarely or not at all Home Living/Prior Functioning Home Living Available Help at Discharge: Family;Available 24 hours/day Type of Home: House Home Access: Stairs to enter [employer] of Steps: 2 Entrance Stairs-Rails: Can reach both Home Layout: One level Bathroom Shower/Tub: Engineer, manufacturing systems: Standard Bathroom Accessibility: Yes  Lives With: Spouse Prior Function Level of Independence: Independent with basic ADLs;Independent with transfers;Independent with gait;Independent with homemaking with ambulation  Able to Take Stairs?: Yes Driving: Yes Vocation: On disability Vision/Perception  Vision - History Ability to See in Adequate Light: 0 Adequate Vision - Assessment Ocular Range of Motion: Restricted looking up;Restricted on the right;Impaired-to be further tested in functional context;Restricted on the left (restricted to R superior field - potentially  from fatigue) Alignment/Gaze Preference: Gaze right;Head turned;Head tilt Tracking/Visual Pursuits: Decreased smoothness of horizontal tracking;Decreased smoothness of vertical tracking;Decreased smoothness of eye movement to RIGHT superior field Saccades: Decreased speed of saccadic movement;Additional head turns occurred during testing;Additional eye shifts occurred during testing Convergence: Impaired (comment) (R eye with minimal movement to midline) Additional Comments: L inattention, L eye with decreased horizontal smoothness but jumps to end range toL, R eye jumps to endrange to R Perception Perception: Impaired Preception Impairment Details: Inattention/Neglect Perception-Other Comments: L inattention Praxis Praxis: Impaired Praxis Impairment Details: Initiation;Motor planning  Cognition Overall Cognitive Status: Impaired/Different from baseline Arousal/Alertness: Awake/alert Orientation Level: Oriented X4 (requires extra time) Focused Attention: Appears intact Sustained Attention: Impaired Memory: Impaired Awareness: Impaired Problem Solving: Impaired Executive Function: Initiating;Self Correcting;Self Monitoring;Sequencing Sequencing: Impaired Initiating: Impaired Self Monitoring: Impaired Self Correcting: Impaired Safety/Judgment: Impaired Sensation Sensation Light Touch: Impaired Detail Light Touch Impaired Details: Impaired LUE;Impaired LLE Proprioception: Impaired by gross assessment Proprioception Impaired Details: Impaired LUE;Impaired LLE Coordination Gross Motor Movements are Fluid and Coordinated: No Fine Motor Movements are Fluid and Coordinated: No Coordination and Movement Description: limited by L hemiplegia with decreased trunk control and L inattention Heel Shin Test: unable to perform Motor  Motor Motor: Hemiplegia Motor - Skilled Clinical Observations: L U/LE hemiplegia with decreased midline orientation, decreased trunk control, and L inattention    Trunk/Postural Assessment  Cervical Assessment Cervical Assessment: Exceptions to [facility] (holds head with L lateral flexion and rotation to R) Thoracic Assessment Thoracic Assessment: Exceptions to [facility] (rounded shoulders) Lumbar Assessment Lumbar Assessment: Exceptions to [facility] (posterior pelvic tilt in sitting) Postural Control Postural Control: Deficits on evaluation Head Control: significantly decreased Trunk Control: significantly decreased  Balance Balance Balance Assessed: Yes Static Sitting Balance Static Sitting - Balance Support: Feet supported;Right upper extremity supported Static Sitting - Level of Assistance: 2: Max assist;3: Mod assist Static Sitting - Comment/# of Minutes: able to initermittently maintain sitting balance with min A for ~30 sec with R UE support and cues to pull instead of push; quickly fatigues requiring up to max A Dynamic Sitting Balance Dynamic Sitting - Balance Support: Feet supported;Right upper extremity supported Dynamic Sitting - Level of Assistance: 1: +1 Total assist Static Standing Balance Static Standing - Balance Support: Right upper extremity supported (LUE with PT support) Static Standing - Level of Assistance: 1: +1 Total assist;2: Max assist Static Standing - Comment/# of Minutes: from MinA to MaxA in STEDY Extremity Assessment      RLE Assessment RLE Assessment: Exceptions to [facility] General Strength Comments: unable to formally assess, functionally 3+ to 4/ 5 LLE Assessment LLE Assessment: Exceptions to [facility] General  Strength Comments: 1/5 at hip flex/ ext, 2-/ 5 for add/ abd, 0/5 for knee and ankle  Care Tool Care Tool Bed Mobility Roll left and right activity   Roll left and right assist level: Total Assistance - Patient < 25%    Sit to lying activity   Sit to lying assist level: 2 Helpers    Lying to sitting on side of bed activity   Lying to sitting on side of bed assist level: the ability to move from lying on the back to  sitting on the side of the bed with no back support.: Total Assistance - Patient < 25%     Care Tool Transfers Sit to stand transfer   Sit to stand assist level: 2 Helpers (STEDY)    Chair/bed transfer   Chair/bed transfer assist level: 2 Helpers (STEDY)    Licensed conveyancer transfer activity did not occur: Safety/medical concerns        Care Tool Locomotion Ambulation Ambulation activity did not occur: Safety/medical concerns        Walk 10 feet activity Walk 10 feet activity did not occur: Safety/medical concerns       Walk 50 feet with 2 turns activity Walk 50 feet with 2 turns activity did not occur: Safety/medical concerns      Walk 150 feet activity Walk 150 feet activity did not occur: Safety/medical concerns      Walk 10 feet on uneven surfaces activity Walk 10 feet on uneven surfaces activity did not occur: Safety/medical concerns      Stairs Stair activity did not occur: Safety/medical concerns        Walk up/down 1 step activity Walk up/down 1 step or curb (drop down) activity did not occur: Safety/medical concerns      Walk up/down 4 steps activity Walk up/down 4 steps activity did not occur: Safety/medical concerns      Walk up/down 12 steps activity Walk up/down 12 steps activity did not occur: Safety/medical concerns      Pick up small objects from floor Pick up small object from the floor (from standing position) activity did not occur: Safety/medical concerns      Wheelchair Is the patient using a wheelchair?: Yes Type of Wheelchair: Manual   Wheelchair assist level: Dependent - Patient 0%    Wheel 50 feet with 2 turns activity   Assist Level: Dependent - Patient 0%  Wheel 150 feet activity   Assist Level: Dependent - Patient 0%    Refer to Care Plan for Long Term Goals  SHORT TERM GOAL WEEK 1 PT Short Term Goal 1 (Week 1): Pt will perform bed mobility with ModA +2. PT Short Term Goal 2 (Week 1): Pt will maintain seated balance with  decreased push of RUE and increased trunk activation to maintain upright position with overall ModA for up to . PT Short Term Goal 3 (Week 1): Pt will perform sit<>stand using STEDY with MinA. PT Short Term Goal 4 (Week 1): Pt will initiate gait training  Recommendations for other services: Therapeutic Recreation  Kitchen group and Stress management  Skilled Therapeutic Intervention Mobility Bed Mobility Bed Mobility: Rolling Right;Rolling Left;Supine to Sit;Sit to Supine Supine to Sit: Maximal Assistance - Patient - Patient 25-49% Sit to Supine: 2 Helpers;Total Assistance - Patient < 25% Transfers Transfers: Sit to Stand;Stand to Sit;Transfer via Lift Equipment Sit to Stand: Maximal Assistance - Patient 25-49%;2 Helpers Stand to Sit: Maximal Assistance - Patient 25-49% Transfer (Assistive device): Other (Comment) (STEDY) Transfer  via Lift Equipment: Engineering geologist: No Gait Gait: No Stairs / Additional Locomotion Stairs: No Corporate treasurer: No  Skilled Intervention: PT Evaluation completed; see above for results. PT educated patient in roles of PT vs OT, PT POC, rehab potential, rehab goals, and discharge recommendations along with recommendation for follow-up rehabilitation services. Individual treatment initiated:  Patient supine in bed upon PT arrival. Patient alert and agreeable to PT session. Is dysarthric and slow to process. Wife and sister present.   No pain complaint during session. Relates no recent pain that keeps pt from sleeping or participating in tasks/ therapy.   Therapeutic Activity/NMR: Bed Mobility: Patient is able to initiate LLE movement into abd but lacks strength to move leg without minimizing gravity. RLE moves with cue for required movement. With BLE off EOB, pt is in modified sidelying and cued to use RUE to push on bed surface requiring MaxA to complete into upright seated position. Unable to maintain seated  balance and requires up to Max/ TotA. Pt pushed to L with RUE on footboard. With vc to pull is able to perform and holds balance with Min/ ModA to maintain imbalance posteriorly/ anteriorly. With repositioning of R hand to knee or EOB, pt completely loses balance to L side and is unable to activate trunk muscles with vc for leaning to R or to orient midline to IV pole.  Transfers: Patient performed sit <> stand transfers throughout session with use of STEDY and with MaxA +2 from therapist and NT. Provided vc for R hand placement to bar and for initiating movement as pt is very slow to respond and demos intermittent effort to maintain upright sitting balance on perch seat. On perch seat, pt is able to improve to MinA for balance with cues for positioning and with distraction in looking up at wife.   Positioned in acquired TIS w/c. Taken dependently to therapy gym in order to adjust w/c for proper support to pt. Requires headrest adjustment, armrest and legrest adjustments.   NMR performed for improvements in motor control and coordination, balance, sequencing, judgement, and self confidence/ efficacy in performing all aspects of mobility at highest level of independence.   Patient supine at end of session with brakes locked, bed alarm set, and all needs within reach.Relates need to toilet and RN/ NT notified.   Discharge Criteria: Patient will be discharged from PT if patient refuses treatment 3 consecutive times without medical reason, if treatment goals not met, if there is a change in medical status, if patient makes no progress towards goals or if patient is discharged from hospital.  The above assessment, treatment plan, treatment alternatives and goals were discussed and mutually agreed upon: by patient and by family  Donne Gage PT, DPT, CSRS 12/07/2023, 6:55 PM

## 2023-12-09 ENCOUNTER — Encounter (HOSPITAL_COMMUNITY): Payer: Self-pay | Admitting: Physical Medicine & Rehabilitation

## 2023-12-09 DIAGNOSIS — B20 Human immunodeficiency virus [HIV] disease: Secondary | ICD-10-CM | POA: Diagnosis not present

## 2023-12-09 DIAGNOSIS — I255 Ischemic cardiomyopathy: Secondary | ICD-10-CM | POA: Diagnosis not present

## 2023-12-09 DIAGNOSIS — I69391 Dysphagia following cerebral infarction: Secondary | ICD-10-CM | POA: Diagnosis not present

## 2023-12-09 DIAGNOSIS — I63511 Cerebral infarction due to unspecified occlusion or stenosis of right middle cerebral artery: Secondary | ICD-10-CM | POA: Diagnosis not present

## 2023-12-09 MED ORDER — ALUM & MAG HYDROXIDE-SIMETH 200-200-20 MG/5ML PO SUSP
30.0000 mL | Freq: Four times a day (QID) | ORAL | Status: DC | PRN
  Administered 2023-12-09 – 2024-01-01 (×7): 30 mL via ORAL
  Filled 2023-12-09 (×7): qty 30

## 2023-12-09 MED ORDER — PANTOPRAZOLE SODIUM 40 MG PO TBEC
40.0000 mg | DELAYED_RELEASE_TABLET | Freq: Every day | ORAL | Status: DC
  Administered 2023-12-09 – 2024-01-05 (×28): 40 mg via ORAL
  Filled 2023-12-09 (×28): qty 1

## 2023-12-09 MED ORDER — CALCIUM CARBONATE ANTACID 500 MG PO CHEW
200.0000 mg | CHEWABLE_TABLET | ORAL | Status: DC | PRN
  Administered 2023-12-26: 200 mg via ORAL
  Filled 2023-12-09: qty 1

## 2023-12-09 NOTE — Progress Notes (Signed)
 Speech Language Pathology Daily Session Note  Patient Details  Name: Richard [name] MRN: [id number] Date of Birth: 09-05-1955  Today's Date: 12/09/2023 SLP Individual Time: 4098-1191 SLP Individual Time Calculation (min): 60 min  Short Term Goals: Week 1: SLP Short Term Goal 1 (Week 1): Patient will utilize compensatory strategies during consumption of least restrictive diet with min multimodal A SLP Short Term Goal 2 (Week 1): Patient will increase speech intelligibility to 90% with use of compensatory strategies given min multimodal A SLP Short Term Goal 3 (Week 1): Patient will demonstrate problem solving skills during mildly complex functional situations given min multimodal A SLP Short Term Goal 4 (Week 1): Patient will recall and utilize memory compensatory strategies given min multimodal A SLP Short Term Goal 5 (Week 1): Patient will sustain attention to functional tasks for upwards of 15 minutes given min multimodal A SLP Short Term Goal 6 (Week 1): Patient will demonstrate emergent awareness of mistakes given min multimodal A  Skilled Therapeutic Interventions:   Pt and sister greeted at bedside. He was pleasant and cooperative throughout tx tasks targeting cognition. He was able to recall some info re MBSS completed yesterday and PT this morning, but required minA to provide additional details. SLP introduced memory notebook to pt and demonstrated ways to utilize said notebook. SLP filled in events of the day thus far based on pt report, but did utilize EMR to confirm accuracy of info. He then completed a functional money management task via menu and benefited from [employer] for organization, processing, and calculations (SLP typed amounts into the calendar for him). During discussion re recall of safe swallow strategies, he reported reflux symptoms to SLP. He was able to recall daily reflux meds at home, but required maxA to provide further details. Nursing and Physicians made aware. At  the end of tx tasks, he was left in bed with the alarm set and call light within reach. Recommend cont ST per POC.   Pain Pain Assessment Pain Scale: 0-10 Pain Score: 2  Pain Type: Acute pain Pain Location: Hip Pain Orientation: Right Pain Descriptors / Indicators: Discomfort Pain Frequency: Intermittent Pain Onset: Gradual Patients Stated Pain Goal: 0 Pain Intervention(s): Repositioned  Therapy/Group: Individual Therapy  Rozell Cornet 12/09/2023, 9:48 AM

## 2023-12-09 NOTE — Progress Notes (Signed)
 Occupational Therapy Session Note  Patient Details  Name: Richard [name] MRN: [id number] Date of Birth: [birth year]  Today's Date: 12/09/2023 OT Individual Time: 8413-2440 OT Individual Time Calculation (min): 40 min    Short Term Goals: Week 1:  OT Short Term Goal 1 (Week 1): Pt will maintain sitting balance min A during BADLs OT Short Term Goal 2 (Week 1): Pt will complete UB bathing with mod A using hemi techniques PRN OT Short Term Goal 3 (Week 1): Pt will donn shirt with mod A using hemi-techniques PRN OT Short Term Goal 4 (Week 1): Pt will tolerate sitting up in wc between therapy sessions 5/7 days OT Short Term Goal 5 (Week 1): Pt will complete toilet transfers with max A x1  Skilled Therapeutic Interventions/Progress Updates:     Pt received reclined in bed with nursing staff present in room. MD in/out for morning rounding. Pt presenting with flat affect, however to be in good spirits receptive to skilled OT session reporting 3/10 pain in R knee- OT offering intermittent rest breaks, repositioning, and therapeutic support to optimize participation in therapy session. Focused this session on functional movement patterns, NMRE, midline orientation, BADL retraining, and improving Pt's overall body awareness. Education provided on hemi-dressing techniques Donned clean pants bed level at beginning of session- total A required to weave L LE with Pt then able to lift R LE to weave into pants; with R LE supported, Pt able to complete bridges x2 to participate in dressing while OT brought pants to waist. Provided education on log rolling technique to come to sitting position- Pt able to utilize posterior hooking method to bring B LEs off EOB with min A and lift trunk using R UE positioned on bed rail with min A required to come to fully upright sitting position with step by step verbal cues provided. Engaged Pt in BADLs sitting EOB to address sitting balance deficits- incorporated R UE into  bathe/dressing tasks to decrease L lateral pushing with noted improvement. Pt able to complete UB bathing min A this session with +2 providing posterior trunk support. Mod A required during UB dressing to weave L UE and bring shirt over head +step by step verbal cues d/t first learning experience. Spent large portion of time working on trunk control, midline orientation, and anterior weight shifting to improve overall body awareness and as preparatory skills for BADL and functional transfers. Pt initially requiring max A for sitting balance, however with increased practice and verbal/tactile cues Pt was able to maintain static sitting balance with CGA-min A for 10-20 secs at a time. Pt was also able to verbalize which direction he was leaning towards and attempt to correct midline orientation following verbal cues with min-mod A. Pt receptive to sitting up in wc until next session. Re-educated Pt on squat pivot technique to support learning. Squat pivot to R mod A +2 +max verbal cues. Pt was left resting in TISWC with call bell in reach, seatbelt alarm on, L UE supported on arm tray, and all needs met.    Therapy Documentation Precautions:  Precautions Precautions: Fall, Other (comment) Precaution/Restrictions Comments: L hemiplegia with flaccidity, R pusher (can correct to pull with vc), dysarthria Restrictions Weight Bearing Restrictions Per Provider Order: No   Therapy/Group: Individual Therapy  Richard [name] 12/09/2023, 7:48 AM

## 2023-12-09 NOTE — Progress Notes (Signed)
 PROGRESS NOTE   Subjective/Complaints:  MBS confirmed D2 thin liq diet, some possible esophageal delay  OT noted an episode of vomiting after drinking H2O yesterday pm   ROS-denies CP, SOB, N/V/D Objective:   DG Swallowing Func-Speech Pathology Result Date: 12/08/2023 Table formatting from the original result was not included. Modified Barium Swallow Study Patient Details Name: Richard [name] MRN: [id number] Date of Birth: 1956-04-03 Today's Date: 12/08/2023 HPI/PMH: HPI: Richard [name] is a 68 y.o. 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  Patient was last seen in his usual state of health last night at 6 PM when his wife came to the hospital to see a family member.  This morning, she called her husband and he initially did not answer but when she called again, she noticed that his speech was slurred and he asked her to come home because something was not right.  When his wife got home, she found the patient on the floor with left-sided weakness, left-sided facial droop, and slurred speech with complaints of a severe headache prompting EMS activation.  On EMS arrival, patient's symptoms were persistent and a code stroke was activated. Clinical Impression: Patient presents with mild oropharyngeal dysphagia. Oral phase is characterized by reduced lingual movement and labial seal resulting in anterior and posterior spillage. Patient independently utilized multiple swallows and digital sweep when necessary to clear oral residuals. Pharyngeal phase is remarkable for reduced epiglottic inversion and pharyngeal stripping wave resulting in mild pharyngeal residuals, mainly  present in the vallecula and pyriform sinuses. Penetration present x2 with thin liquids (x1 via straw, x1 during liquid wash), though likely due to premature spillage and mistiming of epiglottic inversion and laryngeal vestibular closure. Pill consumed in puree, requiring additional bites of PO due to suspected esophageal retention. Recommend continuation of D2/thin liquid diet with small, single sips of liquids. Patient should continue to utilize digital sweep to clear buccal pocketing of solids. Recommend medications administered whole in puree with additional bites to promote esophogeal clearance. DIGEST Swallow Severity Rating*  Safety: 1  Efficiency: 0  Overall Pharyngeal Swallow Severity: 1 mild 1: mild; 2: moderate; 3: severe; 4: profound *The Dynamic Imaging Grade of Swallowing Toxicity is standardized for the head and neck cancer population, however, demonstrates promising clinical applications across populations to standardize the clinical rating of pharyngeal swallow safety and severity. Factors that may increase risk of adverse event in presence of aspiration Roderick Civatte & Jessy Morocco 2021): Factors that may increase risk of adverse event in presence of aspiration Roderick Civatte & Jessy Morocco 2021): Limited mobility Recommendations/Plan: Swallowing Evaluation Recommendations Swallowing Evaluation Recommendations Recommendations: PO diet PO Diet Recommendation: Dysphagia 2 (Finely chopped); Thin liquids (Level 0) Liquid Administration via: Cup; No straw Medication Administration: Whole meds with puree Supervision: Full supervision/cueing for swallowing strategies Swallowing strategies  : Slow rate; Small bites/sips; Check for pocketing or oral holding; Minimize environmental distractions; Check for anterior loss Postural changes: Position pt fully upright for  meals Oral care recommendations: Oral care BID (2x/day) Treatment Plan Treatment Plan Treatment recommendations: Therapy as outlined in treatment plan below Follow-up  recommendations: Home health SLP Recommendations Comment: n/a Functional status assessment: Patient has had a recent decline in their functional status and demonstrates the ability to make significant improvements in function in a reasonable and predictable amount of time. Treatment frequency: Min 2x/week Treatment duration: 2 weeks Interventions: Aspiration precaution training; Compensatory techniques; Patient/family education; Trials of upgraded texture/liquids; Respiratory muscle strength training Recommendations Recommendations for follow up therapy are one component of a multi-disciplinary discharge planning process, led by the attending physician.  Recommendations may be updated based on patient status, additional functional criteria and insurance authorization. Assessment: Orofacial Exam: Orofacial Exam Oral Cavity: Oral Hygiene: WFL Oral Cavity - Dentition: Edentulous; Dentures, top Orofacial Anatomy: WFL Oral Motor/Sensory Function: Suspected cranial nerve impairment (moderate oral weakness,  reduced lingal and facial ROM and asymmetry) Anatomy: Anatomy: Other (Comment) (diffuse calcifications) Boluses Administered: Boluses Administered Boluses Administered: Thin liquids (Level 0); Puree; Solid  Oral Impairment Domain: Oral Impairment Domain Lip Closure: Escape from interlabial space or lateral juncture, no extension beyond vermillion border Tongue control during bolus hold: Posterior escape of less than half of bolus (oral residuals) Bolus preparation/mastication: Disorganized chewing/mashing with solid pieces of bolus unchewed Bolus transport/lingual motion: Repetitive/disorganized tongue motion Oral residue: Residue collection on oral structures Location of oral residue : Lateral sulci; Tongue Initiation of pharyngeal swallow : Valleculae (x2 penetration before swallow initiation)  Pharyngeal Impairment Domain: Pharyngeal Impairment Domain Soft palate elevation: No bolus between soft palate  (SP)/pharyngeal wall (PW) Laryngeal elevation: Complete superior movement of thyroid  cartilage with complete approximation of arytenoids to epiglottic petiole Anterior hyoid excursion: Complete anterior movement Epiglottic movement: Partial inversion Laryngeal vestibule closure: Complete, no air/contrast in laryngeal vestibule Pharyngeal stripping wave : Present - diminished Pharyngoesophageal segment opening: Partial distention/partial duration, partial obstruction of flow Tongue base retraction: Trace column of contrast or air between tongue base and PPW Pharyngeal residue: Collection of residue within or on pharyngeal structures Location of pharyngeal residue: Valleculae; Pyriform sinuses  Esophageal Impairment Domain: Esophageal Impairment Domain Esophageal clearance upright position: Esophageal retention Pill: Pill Consistency administered: Puree Puree: Impaired (see clinical impressions) Penetration/Aspiration Scale Score: Penetration/Aspiration Scale Score 1.  Material does not enter airway: Puree; Solid; Pill 3.  Material enters airway, remains ABOVE vocal cords and not ejected out: Thin liquids (Level 0) Compensatory Strategies: Compensatory Strategies Compensatory strategies: Yes Straw: Ineffective Ineffective Straw: Thin liquid (Level 0) Multiple swallows: Effective Effective Multiple Swallows: Thin liquid (Level 0)   General Information: Caregiver present: No  Diet Prior to this Study: Dysphagia 2 (finely chopped); Thin liquids (Level 0)   No data recorded  No data recorded  Supplemental O2: None (Room air)   No data recorded Behavior/Cognition: Cooperative; Alert; Pleasant mood Self-Feeding Abilities: Needs assist with self-feeding No data recorded No data recorded Volitional Swallow: Able to elicit Exam Limitations: No limitations Goal Planning: Prognosis for improved oropharyngeal function: Good No data recorded No data recorded No data recorded Consulted and agree with results and recommendations:  Patient Pain: No data recorded End of Session: Start Time:No data recorded Stop Time: No data recorded Time Calculation:No data recorded Charges: No data recorded SLP visit diagnosis: SLP Visit Diagnosis: Dysarthria and anarthria (R47.1); Attention and concentration deficit; Dysphagia, oropharyngeal phase (R13.12) Past Medical History: Past Medical History: Diagnosis Date  Coronary artery disease   a. cath 04/02/2014 3v dx 80-90% in D1, 80-90% mid LAD, 95% apical LAD,  95% distal inferior LV branch of LCx, total occlusion of prox RCA  b). Cath 04/03/2014 DES to mid LAD, DES x3 to prox D1, DAPT indefinitely. LCx lesion untreated  GERD (gastroesophageal reflux disease)   HIV (human immunodeficiency virus infection) (HCC)   Hypertension   Ischemic cardiomyopathy 08/09/2013  EF 20-25 percent by echo in 04/2014, s/p [employer] Fortify Assura model [id number] serial number [id number]  Stroke [facility])  Past Surgical History: Past Surgical History: Procedure Laterality Date  CARDIAC CATHETERIZATION  04/02/2014  DR Loetta Ringer  ESOPHAGOGASTRODUODENOSCOPY (EGD) WITH PROPOFOL  N/A 09/08/2018  Procedure: ESOPHAGOGASTRODUODENOSCOPY (EGD) WITH PROPOFOL ;  Surgeon: Alvis Jourdain, MD;  Location: [facility] ENDOSCOPY;  Service: Endoscopy;  Laterality: N/A;  IMPLANTABLE CARDIOVERTER DEFIBRILLATOR IMPLANT N/A 09/10/2014  Procedure: IMPLANTABLE CARDIOVERTER DEFIBRILLATOR IMPLANT;  Surgeon: Luana Rumple, MD;  [facility] Rivereno model [id number] serial number [id number]  IR CT HEAD LTD  12/01/2023  IR PERCUTANEOUS ART THROMBECTOMY/INFUSION INTRACRANIAL INC DIAG ANGIO  12/01/2023  IR US  GUIDE VASC ACCESS RIGHT  12/01/2023  LEFT AND RIGHT HEART CATHETERIZATION WITH CORONARY ANGIOGRAM N/A 04/02/2014  Procedure: LEFT AND RIGHT HEART CATHETERIZATION WITH CORONARY ANGIOGRAM;  Surgeon: Millicent Ally, MD;  Location[facility] C[facility] LAB;  Service: Cardiovascular;  Laterality: N/A;  MEDIAL COLLATERAL LIGAMENT REPAIR, KNEE Right 08/25/2015  Procedure: RIGHT THUMB RADICAL COLLATERAL  LIGAMENT REPAIR;  Surgeon: Florida Hurter, MD;  Location: [facility] OR;  Service: Orthopedics;  Laterality: Right;  PERCUTANEOUS CORONARY STENT INTERVENTION (PCI-S) N/A 04/03/2014  Procedure: PERCUTANEOUS CORONARY STENT INTERVENTION (PCI-S);  Surgeon: Peter M Swaziland, MD;  Location: [facility] CATH LAB;  Service: Cardiovascular;  Laterality: N/A;  RADIOLOGY WITH ANESTHESIA N/A 12/01/2023  Procedure: RADIOLOGY WITH ANESTHESIA;  Surgeon: Radiologist, Medication, MD;  Location: [facility] OR;  Service: Radiology;  Laterality: N/A;  Juana Nones DILATION N/A 09/08/2018  Procedure: SAVORY DILATION;  Surgeon: Alvis Jourdain, MD;  Location: [facility] ENDOSCOPY;  Service: Endoscopy;  Laterality: N/A; Cassidi F Sockwell 12/08/2023, 9:59 AM  Recent Labs    12/07/23 0505  WBC 5.4  HGB 14.8  HCT 42.8  PLT 162   Recent Labs    12/07/23 0505 12/08/23 0549  NA 136  --   K 4.4  --   CL 103  --   CO2 23  --   GLUCOSE 98  --   BUN 22  --   CREATININE 1.62* 1.57*  CALCIUM  9.4  --     Intake/Output Summary (Last 24 hours) at 12/09/2023 0804 Last data filed at 12/09/2023 0300 Gross per 24 hour  Intake 200 ml  Output 400 ml  Net -200 ml        Physical Exam: Vital Signs Blood pressure 106/75, pulse 74, temperature 97.6 F (36.4 C), temperature source Oral, resp. rate 17, height 5\' 10"  (1.778 m), weight 73.9 kg, SpO2 100%.  General: No acute distress Mood and affect are appropriate Heart: Regular rate and rhythm no rubs murmurs or extra sounds Lungs: Clear to auscultation, breathing unlabored, no rales or wheezes Abdomen: Positive bowel sounds, soft nontender to palpation, nondistended Extremities: No clubbing, cyanosis, or edema Skin: No evidence of breakdown, no evidence of rash Neurologic: Cranial nerves II through XII intact, motor strength is 5/5 in right and 0/5 left  deltoid, bicep, tricep, grip, hip flexor, knee extensors, ankle dorsiflexor and plantar flexor Sensory exam normal sensation to light touch right side and  LLE absent LT LUE but feels pinch  Cerebellar exam unable to perform on left due to weakness  Musculoskeletal: Full range of motion  in all 4 extremities. No joint swelling    Assessment/Plan: 1. Functional deficits which require 3+ hours per day of interdisciplinary therapy in a comprehensive inpatient rehab setting. Physiatrist is providing close team supervision and 24 hour management of active medical problems listed below. Physiatrist and rehab team continue to assess barriers to discharge/monitor patient progress toward functional and medical goals  Care Tool:  Bathing    Body parts bathed by patient: Face   Body parts bathed by helper: Left lower leg, Right lower leg, Right upper leg, Left upper leg, Buttocks, Front perineal area, Abdomen, Chest, Left arm, Right arm     Bathing assist Assist Level: 2 Helpers     Upper Body Dressing/Undressing Upper body dressing   What is the patient wearing?: Pull over shirt    Upper body assist Assist Level: Total Assistance - Patient < 25%    Lower Body Dressing/Undressing Lower body dressing      What is the patient wearing?: Pants, Underwear/pull up     Lower body assist Assist for lower body dressing: 2 Helpers     Toileting Toileting    Toileting assist Assist for toileting: 2 Helpers (simulated)     Transfers Chair/bed transfer  Transfers assist     Chair/bed transfer assist level: 2 Helpers (STEDY)     Locomotion Ambulation   Ambulation assist   Ambulation activity did not occur: Safety/medical concerns          Walk 10 feet activity   Assist  Walk 10 feet activity did not occur: Safety/medical concerns        Walk 50 feet activity   Assist Walk 50 feet with 2 turns activity did not occur: Safety/medical concerns         Walk 150 feet activity   Assist Walk 150 feet activity did not occur: Safety/medical concerns         Walk 10 feet on uneven surface  activity   Assist  Walk 10 feet on uneven surfaces activity did not occur: Safety/medical concerns         Wheelchair     Assist Is the patient using a wheelchair?: Yes Type of Wheelchair: Manual    Wheelchair assist level: Dependent - Patient 0%      Wheelchair 50 feet with 2 turns activity    Assist        Assist Level: Dependent - Patient 0%   Wheelchair 150 feet activity     Assist      Assist Level: Dependent - Patient 0%   Blood pressure 106/75, pulse 74, temperature 97.6 F (36.4 C), temperature source Oral, resp. rate 17, height 5\' 10"  (1.778 m), weight 73.9 kg, SpO2 100%.  Medical Problem List and Plan: 1. 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             -patient may  shower             -ELOS/Goals: 14-18 days Min A Admit to CIR for PT, OT and SLP Severe CHF, change to 15/7 , very fatigued after OT eval this am   2.  Antithrombotics: -DVT/anticoagulation: Eliquis              -antiplatelet therapy: ASA 81mg  daily 3. Pain Management: Tylenol  as needed 4. Mood/Behavior/Sleep: Provide emotional support             -antipsychotic agents: N/A 5. Neuropsych/cognition: This patient is  capable of making decisions on his own behalf. 6. Skin/Wound Care: Routine skin checks 7. Fluids/Electrolytes/Nutrition: Routine in and outs with follow-up chemistries 8.  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  Followed by heart failure team.  Monitor for any signs of fluid overload.  Toprol  XL 12.5 mg daily.  Monitor for any hypotension Vitals:   12/08/23 2026 12/09/23 0405  BP: 115/83 106/75  Pulse: 87 74  Resp: 16 17  Temp: 97.6 F (36.4 C) 97.6 F (36.4 C)  SpO2: 100% 100%    9.  CAD/MI/CABG.  Follow-up cardiology services.  Currently maintained on aspirin  and Plavix  10.  VT.  Last episode noted 2023 with  successful ATP. 11.  CKD stage IIIA=  Baseline creatinine 1.3-1.5.  Follow-up chemistries, 4/30 creat creeping up associated with low intake and good output , enc po fluids, avoid IV if possible , no diuretics to adjust  12.  HIV.  Continue Dovato - followed at the [facility] 13.  Hyperlipidemia.  Lipitor/Zetia  14.  GERD.  Pepcid  15.  Constipation.  MiraLAX  daily, Senokot as needed- no BM in 4 days- wants intervention tomorrow if doesn't go 16.  Dysphagia.  Patient notes coughing on current diet of mechanical soft.  Dysphagia 2 thin liquids and have speech therapy follow-up.   MBS and FEES looked ok  17. Daily HA's since stroke- tylenol  is working- gets up to 3-4/10- might need topamax for prevention, no pain today   18.  Vomiting episode after liquid, not recurrent will monitor       LOS: 3 days A FACE TO FACE EVALUATION WAS PERFORMED  Genetta Kenning 12/09/2023, 8:04 AM

## 2023-12-09 NOTE — Progress Notes (Signed)
 Physical Therapy Session Note  Patient Details  Name: Richard [name] MRN: [id number] Date of Birth: 07/04/1956  Today's Date: 12/09/2023 PT Individual Time: 1118-1200 PT Individual Time Calculation (min): 42 min   Short Term Goals: Week 1:  PT Short Term Goal 1 (Week 1): Pt will perform bed mobility with ModA +2. PT Short Term Goal 2 (Week 1): Pt will maintain seated balance with decreased push of RUE and increased trunk activation to maintain upright position with overall ModA for up to . PT Short Term Goal 3 (Week 1): Pt will perform sit<>stand using STEDY with MinA. PT Short Term Goal 4 (Week 1): Pt will initiate gait training  Skilled Therapeutic Interventions/Progress Updates:  Patient supine in bed on entrance to room. Patient alert and agreeable to PT session.   Patient with no pain complaint at start of session.  Therapeutic Activity/ NMR: Bed Mobility: Pt performed supine > sit with improved ability to perform LLE abduction to bring LE off EOB - continues to require Mod/ MaxA for LLE. RLE positions with vc and supervision. Then ModA to assist UB to upright seated position with pt's RUE pulling on therapist's arm.  VC/ tc required throughout for sequencing and technique. Pt with slightly improved seated balance on EOB. Requires ModA at most and up to close supervision with RUE hand hold to foot of bed. Minimal pushing.   Transfers: Pt performed using STEDY throughout session requiring Mod/ MaxA to rise to stand and requires vc for tucking of pelvis in order to position perch seat behind him. Minimal activation in L glute and only slightly more activation felt in L quad but only intermittently.   NMR facilitated during session with focus on seated/ standing balance, motor control, muscle fiber activation/ facilitation. Pt guided in seated balance training with attempt to increase trunk activation of L hemibody. Changed RUE positioning in order to attempt to decrease need for  hold to foot of bed. Also challenged pt with reaching for items nearby using RUE and initially requires MaxA for balance but with more functional movements, pt is able to improve to ModA to complete. When attempting new/ novel task, pt requires more assist for balance maintenance.   STEDY used for standing balance training. Demos improved trunk control while seated on perch seat in STEDY throughout NMR. Pt is variable in ability to perform power up requiring ModA up to MaxA to complete. Pt is better able to extend BLE/ trunk and pelvis with cue to "tuck tail". Performed blocked practice with use of STEDY. From perch seat, pt requires MinA to stand. Minisquats in STEDY require MaxA for LLE movement. Requires facilitation to stance on RLE in order to unlock L knee from TKE. Has no muscle activation to mobilize knee or hip. Does not improve throughout session.   NMR performed for improvements in motor control and coordination, balance, sequencing, judgement, and self confidence/ efficacy in performing all aspects of mobility at highest level of independence.   STEDY used to return pt to bed. MaxA for return to supine with pt moving straight back into supine upon sitting to bed. Positioned across bed. Requires return to seated position and MaxA to sidelying with vc, however pt continues to attempt to return to supine. Requires MaxA +2 to return to supine and position toward HOB.   Patient supine in bed at end of session with brakes locked, bed alarm set, and all needs within reach.   Therapy Documentation Precautions:  Precautions Precautions: Fall, Other (comment) Precaution/Restrictions  Comments: L hemiplegia with flaccidity, R pusher (can correct to pull with vc), dysarthria Restrictions Weight Bearing Restrictions Per Provider Order: No  Pain:  No pain related this session.   Therapy/Group: Individual Therapy  Donne Gage PT, DPT, CSRS 12/09/2023, 4:35 PM

## 2023-12-10 DIAGNOSIS — N1831 Chronic kidney disease, stage 3a: Secondary | ICD-10-CM | POA: Diagnosis not present

## 2023-12-10 DIAGNOSIS — R252 Cramp and spasm: Secondary | ICD-10-CM

## 2023-12-10 DIAGNOSIS — I63511 Cerebral infarction due to unspecified occlusion or stenosis of right middle cerebral artery: Secondary | ICD-10-CM | POA: Diagnosis not present

## 2023-12-10 DIAGNOSIS — I69391 Dysphagia following cerebral infarction: Secondary | ICD-10-CM | POA: Diagnosis not present

## 2023-12-10 MED ORDER — BACLOFEN 5 MG HALF TABLET
5.0000 mg | ORAL_TABLET | Freq: Two times a day (BID) | ORAL | Status: DC
  Administered 2023-12-10: 5 mg via ORAL
  Filled 2023-12-10 (×2): qty 1

## 2023-12-10 NOTE — Progress Notes (Signed)
 Orthopedic Tech Progress Note Patient Details:  Richard [name] 02/20/1956 [id number]  Ortho Devices Type of Ortho Device: Prafo boot/shoe Ortho Device/Splint Location: LLE Ortho Device/Splint Interventions: Application   Post Interventions Patient Tolerated: Well  [name] 12/10/2023, 8:34 AM

## 2023-12-10 NOTE — Progress Notes (Signed)
 Physical Therapy Session Note  Patient Details  Name: Richard [name] MRN: [id number] Date of Birth: [birth year]  Today's Date: 12/09/2023 PT Missed Time: 60 Minutes Missed Time Reason: Patient fatigue  Short Term Goals: Week 1:  PT Short Term Goal 1 (Week 1): Pt will perform bed mobility with ModA +2. PT Short Term Goal 2 (Week 1): Pt will maintain seated balance with decreased push of RUE and increased trunk activation to maintain upright position with overall ModA for up to . PT Short Term Goal 3 (Week 1): Pt will perform sit<>stand using STEDY with MinA. PT Short Term Goal 4 (Week 1): Pt will initiate gait training  Skilled Therapeutic Interventions/Progress Updates:   Patient supine in bed and asleep on entrance to room. Patient is difficult to rouse and falls back asleep numerous times prior to determining that pt is not alert enough for therapy session at this time. Pt allowed to sleep. LUE propped on pillow for improved positioning.  Patient with no indication of pain.  Pt missed 60 min of skilled therapy due to fatigue. Will re-attempt as schedule and pt availability permits.  Therapy Documentation Precautions:  Precautions Precautions: Fall, Other (comment) Precaution/Restrictions Comments: L hemiplegia with flaccidity, R pusher (can correct to pull with vc), dysarthria Splint/Cast - Date Prophylactic Dressing Applied (if applicable):  (PRafo ordered) Restrictions Weight Bearing Restrictions Per Provider Order: No  Pain:  No indication of pain at this time.   Therapy/Group: Individual Therapy  Donne Gage PT, DPT, CSRS 12/09/2023, 7:48 PM

## 2023-12-10 NOTE — Progress Notes (Signed)
 Physical Therapy Session Note  Patient Details  Name: Richard [name] MRN: [id number] Date of Birth: 06/03/1956  Today's Date: 12/10/2023 PT Individual Time: 6440-3474 PT Individual Time Calculation (min): 55 min  and Today's Date: 12/10/2023 PT Missed Time: 20 Minutes Missed Time Reason: Other (Comment) (completing breakfast)  Short Term Goals: Week 1:  PT Short Term Goal 1 (Week 1): Pt will perform bed mobility with ModA +2. PT Short Term Goal 2 (Week 1): Pt will maintain seated balance with decreased push of RUE and increased trunk activation to maintain upright position with overall ModA for up to . PT Short Term Goal 3 (Week 1): Pt will perform sit<>stand using STEDY with MinA. PT Short Term Goal 4 (Week 1): Pt will initiate gait training  Skilled Therapeutic Interventions/Progress Updates:  Patient supine in bed on entrance to room. Eating breakfast but no supervision in room. Patient alert and agreeable to PT session after completing breakfast. NT notified and is then in room to complete supervision of breakfast. NT relates pt was not eating when pt was left in room.   Patient with no pain complaint at start of session.  Therapeutic Activity: Bed Mobility: After breakfast completed, returned to room and initiated therapy session. Pt performed supine > sit with reduced mm activation as was noted previous day. Requires Max/ TotA to m,ove LLE to EOB. No assist required for RLE and is able to move pelvis for improved positioning. ModA required to assist UB to upright seated position on EOB. VC/ tc required for sequencing of technique throughout. At end of session, pt again attempts this day to immediately lie back upon reaching seated position on EOB. Guided into sidelying and then bringing BLE to bed surface with roll back to supine. Again requires MaxA +2 to perform. Is able to assist to [facility] with use of RLE in extensor push with ModA +2. . Transfers: Pt performed sit<>stand and stand  pivot transfers throughout session with use of STEDY and Mod/ MaxA +2. Provided vc/ tc for sequencing of technique throughout including forward translation of hips.  Neuromuscular Re-ed: NMR facilitated during session with focus on seated/ standing balance, motor control, proprioception. Pt guided in stance from w/c with +2 available. Requires ModA from +2 but does not want to maintain LUE to +2. Requires MaxA to R hemibody to maintain upright stance. Mirror used in order to improve upright head hold, and attempt to improve pt's understanding of midline. Has to be continuously positioned into midline as he does not self-correct. Pt guided in minisquats in order to increase activation in LLE. No mm activation noted in quad or glute. Progress to weight shifting with cue to "hip bump" +2 on pt's R side and then facilitated to L side with physical assist initially but then able to perform with R sided activation. No controlling activation noted on L side.  NMR performed for improvements in motor control and coordination, balance, sequencing, judgement, and self confidence/ efficacy in performing all aspects of mobility at highest level of independence.   Patient supine in bed at end of session with brakes locked, bed alarm set, and all needs within reach.   Therapy Documentation Precautions:  Precautions Precautions: Fall, Other (comment) Precaution/Restrictions Comments: L hemiplegia with flaccidity, R pusher (can correct to pull with vc), dysarthria Splint/Cast - Date Prophylactic Dressing Applied (if applicable):  (PRafo ordered) Restrictions Weight Bearing Restrictions Per Provider Order: No General: PT Amount of Missed Time (min): 20 Minutes PT Missed Treatment Reason: Other (Comment) (  completing breakfast) Vital Signs:   Pain:  No pain related this session. Pt does relate fatigue.    Therapy/Group: Individual Therapy  Donne Gage PT, DPT, CSRS 12/10/2023, 6:50 PM

## 2023-12-10 NOTE — Progress Notes (Signed)
 PROGRESS NOTE   Subjective/Complaints:  Pt without new complaints except for mild headache. Denies any pain. Able to sleep last night  ROS: Patient denies fever, rash, sore throat, blurred vision, dizziness, nausea, vomiting, diarrhea, cough, shortness of breath or chest pain,   or mood change.    Objective:   DG Swallowing Func-Speech Pathology Result Date: 12/08/2023 Table formatting from the original result was not included. Modified Barium Swallow Study Patient Details Name: Richard [name] MRN: [id number] Date of Birth: [birth year] Today's Date: 12/08/2023 HPI/PMH: HPI: Richard [name] is a 69 y.o. 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  Patient was last seen in his usual state of health last night at 6 PM when his wife came to the hospital to see a family member.  This morning, she called her husband and he initially did not answer but when she called again, she noticed that his speech was slurred and he asked her to come home because something was not right.  When his wife got home, she found the patient on the floor with left-sided weakness, left-sided facial droop, and slurred speech with complaints of a severe headache prompting EMS activation.  On EMS arrival, patient's symptoms were persistent and a code stroke was activated. Clinical Impression: Patient presents with mild oropharyngeal dysphagia. Oral phase is characterized by reduced lingual movement and labial seal resulting in anterior and posterior spillage. Patient independently utilized multiple swallows and digital sweep when necessary to clear oral residuals. Pharyngeal phase is remarkable for  reduced epiglottic inversion and pharyngeal stripping wave resulting in mild pharyngeal residuals, mainly present in the vallecula and pyriform sinuses. Penetration present x2 with thin liquids (x1 via straw, x1 during liquid wash), though likely due to premature spillage and mistiming of epiglottic inversion and laryngeal vestibular closure. Pill consumed in puree, requiring additional bites of PO due to suspected esophageal retention. Recommend continuation of D2/thin liquid diet with small, single sips of liquids. Patient should continue to utilize digital sweep to clear buccal pocketing of solids. Recommend medications administered whole in puree with additional bites to promote esophogeal clearance. DIGEST Swallow Severity Rating*  Safety: 1  Efficiency: 0  Overall Pharyngeal Swallow Severity: 1 mild 1: mild; 2: moderate; 3: severe; 4: profound *The Dynamic Imaging Grade of Swallowing Toxicity is standardized for the head and neck cancer population, however, demonstrates promising clinical applications across populations to standardize the clinical rating of pharyngeal swallow safety and severity. Factors that may increase risk of adverse event in presence of aspiration Roderick Civatte & Jessy Morocco 2021): Factors that may increase risk of adverse event in presence of aspiration Roderick Civatte & Jessy Morocco 2021): Limited mobility Recommendations/Plan: Swallowing Evaluation Recommendations Swallowing Evaluation Recommendations Recommendations: PO diet PO Diet Recommendation: Dysphagia 2 (Finely chopped); Thin liquids (Level 0) Liquid Administration via: Cup; No straw Medication Administration: Whole meds with puree Supervision: Full supervision/cueing for swallowing strategies Swallowing strategies  : Slow rate; Small bites/sips; Check for pocketing  or oral holding; Minimize environmental distractions; Check for anterior loss Postural changes: Position pt fully upright for meals Oral care recommendations: Oral care BID (2x/day)  Treatment Plan Treatment Plan Treatment recommendations: Therapy as outlined in treatment plan below Follow-up recommendations: Home health SLP Recommendations Comment: n/a Functional status assessment: Patient has had a recent decline in their functional status and demonstrates the ability to make significant improvements in function in a reasonable and predictable amount of time. Treatment frequency: Min 2x/week Treatment duration: 2 weeks Interventions: Aspiration precaution training; Compensatory techniques; Patient/family education; Trials of upgraded texture/liquids; Respiratory muscle strength training Recommendations Recommendations for follow up therapy are one component of a multi-disciplinary discharge planning process, led by the attending physician.  Recommendations may be updated based on patient status, additional functional criteria and insurance authorization. Assessment: Orofacial Exam: Orofacial Exam Oral Cavity: Oral Hygiene: WFL Oral Cavity - Dentition: Edentulous; Dentures, top Orofacial Anatomy: WFL Oral Motor/Sensory Function: Suspected cranial nerve impairment (moderate oral weakness,  reduced lingal and facial ROM and asymmetry) Anatomy: Anatomy: Other (Comment) (diffuse calcifications) Boluses Administered: Boluses Administered Boluses Administered: Thin liquids (Level 0); Puree; Solid  Oral Impairment Domain: Oral Impairment Domain Lip Closure: Escape from interlabial space or lateral juncture, no extension beyond vermillion border Tongue control during bolus hold: Posterior escape of less than half of bolus (oral residuals) Bolus preparation/mastication: Disorganized chewing/mashing with solid pieces of bolus unchewed Bolus transport/lingual motion: Repetitive/disorganized tongue motion Oral residue: Residue collection on oral structures Location of oral residue : Lateral sulci; Tongue Initiation of pharyngeal swallow : Valleculae (x2 penetration before swallow initiation)  Pharyngeal  Impairment Domain: Pharyngeal Impairment Domain Soft palate elevation: No bolus between soft palate (SP)/pharyngeal wall (PW) Laryngeal elevation: Complete superior movement of thyroid  cartilage with complete approximation of arytenoids to epiglottic petiole Anterior hyoid excursion: Complete anterior movement Epiglottic movement: Partial inversion Laryngeal vestibule closure: Complete, no air/contrast in laryngeal vestibule Pharyngeal stripping wave : Present - diminished Pharyngoesophageal segment opening: Partial distention/partial duration, partial obstruction of flow Tongue base retraction: Trace column of contrast or air between tongue base and PPW Pharyngeal residue: Collection of residue within or on pharyngeal structures Location of pharyngeal residue: Valleculae; Pyriform sinuses  Esophageal Impairment Domain: Esophageal Impairment Domain Esophageal clearance upright position: Esophageal retention Pill: Pill Consistency administered: Puree Puree: Impaired (see clinical impressions) Penetration/Aspiration Scale Score: Penetration/Aspiration Scale Score 1.  Material does not enter airway: Puree; Solid; Pill 3.  Material enters airway, remains ABOVE vocal cords and not ejected out: Thin liquids (Level 0) Compensatory Strategies: Compensatory Strategies Compensatory strategies: Yes Straw: Ineffective Ineffective Straw: Thin liquid (Level 0) Multiple swallows: Effective Effective Multiple Swallows: Thin liquid (Level 0)   General Information: Caregiver present: No  Diet Prior to this Study: Dysphagia 2 (finely chopped); Thin liquids (Level 0)   No data recorded  No data recorded  Supplemental O2: None (Room air)   No data recorded Behavior/Cognition: Cooperative; Alert; Pleasant mood Self-Feeding Abilities: Needs assist with self-feeding No data recorded No data recorded Volitional Swallow: Able to elicit Exam Limitations: No limitations Goal Planning: Prognosis for improved oropharyngeal function: Good No data  recorded No data recorded No data recorded Consulted and agree with results and recommendations: Patient Pain: No data recorded End of Session: Start Time:No data recorded Stop Time: No data recorded Time Calculation:No data recorded Charges: No data recorded SLP visit diagnosis: SLP Visit Diagnosis: Dysarthria and anarthria (R47.1); Attention and concentration deficit; Dysphagia, oropharyngeal phase (R13.12) Past Medical History: Past Medical History: Diagnosis Date  Coronary artery  disease   a. cath 04/02/2014 3v dx 80-90% in D1, 80-90% mid LAD, 95% apical LAD, 95% distal inferior LV branch of LCx, total occlusion of prox RCA  b). Cath 04/03/2014 DES to mid LAD, DES x3 to prox D1, DAPT indefinitely. LCx lesion untreated  GERD (gastroesophageal reflux disease)   HIV (human immunodeficiency virus infection) (HCC)   Hypertension   Ischemic cardiomyopathy 08/09/2013  EF 20-25 percent by echo in 04/2014, s/p [employer] Assura model [id number] serial number [id number]  Stroke [facility])  Past Surgical History: Past Surgical History: Procedure Laterality Date  CARDIAC CATHETERIZATION  04/02/2014  DR Loetta Ringer  ESOPHAGOGASTRODUODENOSCOPY (EGD) WITH PROPOFOL  N/A 09/08/2018  Procedure: ESOPHAGOGASTRODUODENOSCOPY (EGD) WITH PROPOFOL ;  Surgeon: Alvis Jourdain, MD;  Location: [facility] ENDOSCOPY;  Service: Endoscopy;  Laterality: N/A;  IMPLANTABLE CARDIOVERTER DEFIBRILLATOR IMPLANT N/A 09/10/2014  Procedure: IMPLANTABLE CARDIOVERTER DEFIBRILLATOR IMPLANT;  Surgeon: Luana Rumple, MD;  [facility] model [id number] serial number [id number]  IR CT HEAD LTD  12/01/2023  IR PERCUTANEOUS ART THROMBECTOMY/INFUSION INTRACRANIAL INC DIAG ANGIO  12/01/2023  IR US  GUIDE VASC ACCESS RIGHT  12/01/2023  LEFT AND RIGHT HEART CATHETERIZATION WITH CORONARY ANGIOGRAM N/A 04/02/2014  Procedure: LEFT AND RIGHT HEART CATHETERIZATION WITH CORONARY ANGIOGRAM;  Surgeon: Millicent Ally, MD;  Location: [facility] CATH LAB;  Service: Cardiovascular;  Laterality: N/A;  MEDIAL  COLLATERAL LIGAMENT REPAIR, KNEE Right 08/25/2015  Procedure: RIGHT THUMB RADICAL COLLATERAL LIGAMENT REPAIR;  Surgeon: Florida Hurter, MD;  Location: [facility] OR;  Service: Orthopedics;  Laterality: Right;  PERCUTANEOUS CORONARY STENT INTERVENTION (PCI-S) N/A 04/03/2014  Procedure: PERCUTANEOUS CORONARY STENT INTERVENTION (PCI-S);  Surgeon: Peter M Swaziland, MD;  Location: [facility] CATH LAB;  Service: Cardiovascular;  Laterality: N/A;  RADIOLOGY WITH ANESTHESIA N/A 12/01/2023  Procedure: RADIOLOGY WITH ANESTHESIA;  Surgeon: Radiologist, Medication, MD;  Location: [facility] OR;  Service: Radiology;  Laterality: N/A;  Juana Nones DILATION N/A 09/08/2018  Procedure: SAVORY DILATION;  Surgeon: Alvis Jourdain, MD;  Location: [facility] ENDOSCOPY;  Service: Endoscopy;  Laterality: N/A; Cassidi F Sockwell 12/08/2023, 9:59 AM  No results for input(s): "WBC", "HGB", "HCT", "PLT" in the last 72 hours.  Recent Labs    12/08/23 0549  CREATININE 1.57*    Intake/Output Summary (Last 24 hours) at 12/10/2023 0804 Last data filed at 12/10/2023 0725 Gross per 24 hour  Intake 1220 ml  Output 875 ml  Net 345 ml        Physical Exam: Vital Signs Blood pressure 103/69, pulse 68, temperature 98.6 F (37 C), resp. rate 17, height 5\' 10"  (1.778 m), weight 73.9 kg, SpO2 100%.  Constitutional: No distress . Vital signs reviewed. HEENT: NCAT, EOMI, oral membranes moist Neck: supple Cardiovascular: RRR without murmur. No JVD    Respiratory/Chest: CTA Bilaterally without wheezes or rales. Normal effort    GI/Abdomen: BS +, non-tender, non-distended Ext: no clubbing, cyanosis, or edema Psych: pleasant and cooperative  Skin: No evidence of breakdown, no evidence of rash Neurologic: Cranial nerves II through XII intact, motor strength is 5/5 in right and 0/5 left  deltoid, bicep, tricep, grip, hip flexor, knee extensors, ankle dorsiflexor and plantar flexor. Flexor tone LUE 2/4 at biceps and 1/4 wrist. LLE extensor tone 1-2/4 Sensory exam  normal sensation to light touch right side and LLE absent LT LUE but feels pinch  Cerebellar exam unable to perform on left due to weakness  Musculoskeletal: Full range of motion in all 4 extremities. No joint swelling    Assessment/Plan: 1. Functional deficits which require 3+ hours  per day of interdisciplinary therapy in a comprehensive inpatient rehab setting. Physiatrist is providing close team supervision and 24 hour management of active medical problems listed below. Physiatrist and rehab team continue to assess barriers to discharge/monitor patient progress toward functional and medical goals  Care Tool:  Bathing    Body parts bathed by patient: Right arm, Left arm, Chest, Abdomen   Body parts bathed by helper: Left lower leg, Right lower leg, Right upper leg, Left upper leg, Buttocks, Front perineal area, Abdomen, Chest, Left arm, Right arm     Bathing assist Assist Level: 2 Helpers     Upper Body Dressing/Undressing Upper body dressing   What is the patient wearing?: Pull over shirt    Upper body assist Assist Level: Maximal Assistance - Patient 25 - 49%    Lower Body Dressing/Undressing Lower body dressing      What is the patient wearing?: Pants, Underwear/pull up     Lower body assist Assist for lower body dressing: Total Assistance - Patient < 25%     Toileting Toileting    Toileting assist Assist for toileting: 2 Helpers (simulated)     Transfers Chair/bed transfer  Transfers assist  Chair/bed transfer activity did not occur: Safety/medical concerns  Chair/bed transfer assist level: 2 Helpers (STEDY)     Locomotion Ambulation   Ambulation assist   Ambulation activity did not occur: Safety/medical concerns          Walk 10 feet activity   Assist  Walk 10 feet activity did not occur: Safety/medical concerns        Walk 50 feet activity   Assist Walk 50 feet with 2 turns activity did not occur: Safety/medical concerns          Walk 150 feet activity   Assist Walk 150 feet activity did not occur: Safety/medical concerns         Walk 10 feet on uneven surface  activity   Assist Walk 10 feet on uneven surfaces activity did not occur: Safety/medical concerns         Wheelchair     Assist Is the patient using a wheelchair?: Yes Type of Wheelchair: Manual    Wheelchair assist level: Dependent - Patient 0%      Wheelchair 50 feet with 2 turns activity    Assist        Assist Level: Dependent - Patient 0%   Wheelchair 150 feet activity     Assist      Assist Level: Dependent - Patient 0%   Blood pressure 103/69, pulse 68, temperature 98.6 F (37 C), resp. rate 17, height 5\' 10"  (1.778 m), weight 73.9 kg, SpO2 100%.  Medical Problem List and Plan: 1. 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             -patient may  shower             -ELOS/Goals: 14-18 days Min A   -Continue CIR therapies including PT, OT, and SLP  Severe CHF, change to 15/7 , very fatigued after OT eval this am   5/3 ordered PRAFO for LLE 2.  Antithrombotics: -DVT/anticoagulation: Eliquis              -antiplatelet therapy: ASA 81mg  daily 3. Pain Management/spasticity: Tylenol  as needed  -emerging left sided tone. Begin low dose baclofen 5mg  bid. Hold for excess sedation 4. Mood/Behavior/Sleep: Provide emotional support             -  antipsychotic agents: N/A 5. Neuropsych/cognition: This patient is capable of making decisions on his own behalf. 6. Skin/Wound Care: Routine skin checks 7. Fluids/Electrolytes/Nutrition: Routine in and outs with follow-up chemistries 8.  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  Followed by heart failure team.  Monitor for any signs of fluid overload.  Toprol  XL 12.5 mg daily.  Monitor for any  hypotension Vitals:   12/10/23 0533 12/10/23 0746  BP: 103/69   Pulse: 73 68  Resp: 17   Temp: 98.6 F (37 C)   SpO2: 100%     9.  CAD/MI/CABG.  Follow-up cardiology services.  Currently maintained on aspirin  and Plavix  10.  VT.  Last episode noted 2023 with successful ATP. 11.  CKD stage IIIA=  Baseline creatinine 1.3-1.5.  Follow-up chemistries, 4/30 creat creeping up associated with low intake and good output , enc po fluids, avoid IV if possible , no diuretics to adjust  12.  HIV.  Continue Dovato - followed at the [facility] 13.  Hyperlipidemia.  Lipitor/Zetia  14.  GERD.  Pepcid  15.  Constipation.  MiraLAX  daily, Senokot as needed- no BM in 4 days- wants intervention tomorrow if doesn't go 16.  Dysphagia.  Patient notes coughing on current diet of mechanical soft.  Dysphagia 2 thin liquids and have speech therapy follow-up.   MBS and FEES looked ok  17. Daily HA's since stroke- tylenol  is working- gets up to 3-4/10- might need topamax for prevention  -headaches seem fairly mild  18.  Vomiting episode after liquid, not recurrent will monitor       LOS: 4 days A FACE TO FACE EVALUATION WAS PERFORMED  Rawland Caddy 12/10/2023, 8:04 AM

## 2023-12-10 NOTE — Progress Notes (Signed)
 Speech Language Pathology Daily Session Note  Patient Details  Name: Richard [name] MRN: [id number] Date of Birth: March 16, 1956  Today's Date: 12/10/2023 SLP Individual Time: 1300-1400 SLP Individual Time Calculation (min): 60 min  Short Term Goals: Week 1: SLP Short Term Goal 1 (Week 1): Patient will utilize compensatory strategies during consumption of least restrictive diet with min multimodal A SLP Short Term Goal 2 (Week 1): Patient will increase speech intelligibility to 90% with use of compensatory strategies given min multimodal A SLP Short Term Goal 3 (Week 1): Patient will demonstrate problem solving skills during mildly complex functional situations given min multimodal A SLP Short Term Goal 4 (Week 1): Patient will recall and utilize memory compensatory strategies given min multimodal A SLP Short Term Goal 5 (Week 1): Patient will sustain attention to functional tasks for upwards of 15 minutes given min multimodal A SLP Short Term Goal 6 (Week 1): Patient will demonstrate emergent awareness of mistakes given min multimodal A  Skilled Therapeutic Interventions:   Pt greeted at bedside. He was asleep upon SLP arrival, but woke easily w/ verbal cues. SLP facilitated a variety of tx tasks targeting cognition and speech production. He reported increased fatigue the last few days and required frequent encouragement to remain awake and an active participant during tx tasks. He benefited from minA cues for recall of daily events when SLP assisted pt to fill in his memory book. SLP also introduced expiratory muscle strength training via EMST75. He required maxA to utilize the device d/t difficulty following directions and fatigue, but was able to complete 5 reps @ 10 cmH2O after multiple attempts. SLP introduced compensatory speech strategies and provided pt w/ a visual aid. He then completed a verbal problem solving task requiring one word responses to facilitate use of strategies at word  level. However, as task progressed pt presented w/ increasing speech intelligibility deficits, increased fatigue, and reduced initiation. Pt's responses and ability to follow directions remained appropriate. Pt's BP was low (90/66) when RNT checked his vitals at the beginning of tx session but were slightly improved (99/72) when SLP checked again at the end of tx tasks. Nursing notified of difficulty this session. Pt's RN reported medication change today, anticipate meds and fatigue negatively impacted overall success this date. Direct handoff completed with nursing. Recommend cont ST per POC.    Pain  Headache  Therapy/Group: Individual Therapy  Rozell Cornet 12/10/2023, 12:59 PM

## 2023-12-10 NOTE — Progress Notes (Signed)
 Occupational Therapy Session Note  Patient Details  Name: Richard [name] MRN: [id number] Date of Birth: 12-16-1955  Today's Date: 12/10/2023 OT Individual Time: 1430-1530 OT Individual Time Calculation (min): 60 min    Short Term Goals: Week 1:  OT Short Term Goal 1 (Week 1): Pt will maintain sitting balance min A during BADLs OT Short Term Goal 2 (Week 1): Pt will complete UB bathing with mod A using hemi techniques PRN OT Short Term Goal 3 (Week 1): Pt will donn shirt with mod A using hemi-techniques PRN OT Short Term Goal 4 (Week 1): Pt will tolerate sitting up in wc between therapy sessions 5/7 days OT Short Term Goal 5 (Week 1): Pt will complete toilet transfers with max A x1  Skilled Therapeutic Interventions/Progress Updates:    Pt greeted semi-reclined in bed awake and alert. OT offered pt the urinal per discussion with nursing, but pt reported he already went in his brief. Pt had in fact been incontinent of urine. Bed mobility completed with max A. Sit<>stand with Stedy with mod A of 1 and multimodal cues for full upright hips and trunk. Verbal cues from +2 to decrease pushing and find midline. Stedy brought to the sink and utilized mirror feedback to help pt find midline. Pt able to wash peri-area and half of upper body sitting perched on stedy seat. Mod to max cues to decrease lateral lean to the L with fatigue. OT provided hand over hand A to integrate L UE into bathing tasks. Moderate cues to locate items on L side due to inattention. OT educated on hemi dressing techniques, but max A needed. Pt brought to therapy gym and worked on problem solving and visual scanning with peg board puzzle. Pt able to complete simple two color pattern with min cues. Progressed to more complex pattern in the shape of a "t." Pt needed OT assist to initiate and locate the first hole, but then was able to work through the pattern with increased time to process and initiate. Pt requested to stay up in wc  and was left seated in TIS wc with alarm belt on, call bell in reach, and needs met.   Therapy Documentation Precautions:  Precautions Precautions: Fall, Other (comment) Precaution/Restrictions Comments: L hemiplegia with flaccidity, R pusher (can correct to pull with vc), dysarthria Splint/Cast - Date Prophylactic Dressing Applied (if applicable):  (PRafo ordered) Restrictions Weight Bearing Restrictions Per Provider Order: No Pain:  Denies pain   Therapy/Group: Individual Therapy  Lethia Raveling 12/10/2023, 3:15 PM

## 2023-12-11 DIAGNOSIS — R252 Cramp and spasm: Secondary | ICD-10-CM | POA: Diagnosis not present

## 2023-12-11 DIAGNOSIS — I69391 Dysphagia following cerebral infarction: Secondary | ICD-10-CM | POA: Diagnosis not present

## 2023-12-11 DIAGNOSIS — N1831 Chronic kidney disease, stage 3a: Secondary | ICD-10-CM | POA: Diagnosis not present

## 2023-12-11 DIAGNOSIS — I63511 Cerebral infarction due to unspecified occlusion or stenosis of right middle cerebral artery: Secondary | ICD-10-CM | POA: Diagnosis not present

## 2023-12-11 NOTE — Progress Notes (Signed)
 PROGRESS NOTE   Subjective/Complaints:  Pt sleepy even with the 5mg  dose of baclofen. He denied other issues this morning. Did receive PRAFO LLE  ROS: Limited due to cognitive/behavioral    Objective:   No results found.  No results for input(s): "WBC", "HGB", "HCT", "PLT" in the last 72 hours.  No results for input(s): "NA", "K", "CL", "CO2", "GLUCOSE", "BUN", "CREATININE", "CALCIUM " in the last 72 hours.   Intake/Output Summary (Last 24 hours) at 12/11/2023 1023 Last data filed at 12/11/2023 0853 Gross per 24 hour  Intake 500 ml  Output 925 ml  Net -425 ml        Physical Exam: Vital Signs Blood pressure 106/82, pulse 66, temperature 97.7 F (36.5 C), temperature source Oral, resp. rate 18, height 5\' 10"  (1.778 m), weight 73.9 kg, SpO2 94%.  Constitutional: No distress . Vital signs reviewed. HEENT: NCAT, EOMI, oral membranes moist Neck: supple Cardiovascular: RRR without murmur. No JVD    Respiratory/Chest: CTA Bilaterally without wheezes or rales. Normal effort    GI/Abdomen: BS +, non-tender, non-distended Ext: no clubbing, cyanosis, or edema Psych: pleasant and cooperative  Skin: No evidence of breakdown, no evidence of rash Neurologic: slow to arouse but awakens and cooperates with exam. Answers questions. Cranial nerves II through XII intact, motor strength is 5/5 in right and 0/5 left  deltoid, bicep, tricep, grip, hip flexor, knee extensors, ankle dorsiflexor and plantar flexor. Flexor tone LUE 1+/4 at biceps and 1/4 wrist. LLE extensor tone 2/4 Sensory exam normal sensation to light touch right side and LLE absent LT LUE but feels pinch  Cerebellar exam unable to perform on left due to weakness  Musculoskeletal: Full range of motion in all 4 extremities. No joint swelling    Assessment/Plan: 1. Functional deficits which require 3+ hours per day of interdisciplinary therapy in a comprehensive inpatient  rehab setting. Physiatrist is providing close team supervision and 24 hour management of active medical problems listed below. Physiatrist and rehab team continue to assess barriers to discharge/monitor patient progress toward functional and medical goals  Care Tool:  Bathing    Body parts bathed by patient: Right arm, Left arm, Chest, Abdomen   Body parts bathed by helper: Left lower leg, Right lower leg, Right upper leg, Left upper leg, Buttocks, Front perineal area, Abdomen, Chest, Left arm, Right arm     Bathing assist Assist Level: 2 Helpers     Upper Body Dressing/Undressing Upper body dressing   What is the patient wearing?: Pull over shirt    Upper body assist Assist Level: Maximal Assistance - Patient 25 - 49%    Lower Body Dressing/Undressing Lower body dressing      What is the patient wearing?: Pants, Underwear/pull up     Lower body assist Assist for lower body dressing: Total Assistance - Patient < 25%     Toileting Toileting    Toileting assist Assist for toileting: 2 Helpers (simulated)     Transfers Chair/bed transfer  Transfers assist  Chair/bed transfer activity did not occur: Safety/medical concerns  Chair/bed transfer assist level: 2 Helpers (STEDY)     Locomotion Ambulation   Ambulation assist   Ambulation activity  did not occur: Safety/medical concerns          Walk 10 feet activity   Assist  Walk 10 feet activity did not occur: Safety/medical concerns        Walk 50 feet activity   Assist Walk 50 feet with 2 turns activity did not occur: Safety/medical concerns         Walk 150 feet activity   Assist Walk 150 feet activity did not occur: Safety/medical concerns         Walk 10 feet on uneven surface  activity   Assist Walk 10 feet on uneven surfaces activity did not occur: Safety/medical concerns         Wheelchair     Assist Is the patient using a wheelchair?: Yes Type of Wheelchair:  Manual    Wheelchair assist level: Dependent - Patient 0%      Wheelchair 50 feet with 2 turns activity    Assist        Assist Level: Dependent - Patient 0%   Wheelchair 150 feet activity     Assist      Assist Level: Dependent - Patient 0%   Blood pressure 106/82, pulse 66, temperature 97.7 F (36.5 C), temperature source Oral, resp. rate 18, height 5\' 10"  (1.778 m), weight 73.9 kg, SpO2 94%.  Medical Problem List and Plan: 1. 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             -patient may  shower             -ELOS/Goals: 14-18 days Min A   -Continue CIR therapies including PT, OT, and SLP  Severe CHF, changed to 15/7 , very fatigued after OT eval this am    PRAFO and WHO for LLE and LUE 2.  Antithrombotics: -DVT/anticoagulation: Eliquis              -antiplatelet therapy: ASA 81mg  daily 3. Pain Management/spasticity: Tylenol  as needed  -emerging left sided tone.    -5/4 unable to tolerate even low dose baclofen--> stopped today   -outpt botox candidate 4. Mood/Behavior/Sleep: Provide emotional support             -antipsychotic agents: N/A 5. Neuropsych/cognition: This patient is capable of making decisions on his own behalf. 6. Skin/Wound Care: Routine skin checks 7. Fluids/Electrolytes/Nutrition: Routine in and outs with follow-up chemistries 8.  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  Followed by heart failure team.  Monitor for any signs of fluid overload.  Toprol  XL 12.5 mg daily.  Monitor for any hypotension Vitals:   12/11/23 0500 12/11/23 0848  BP: 103/76 106/82  Pulse: 68 66  Resp: 18   Temp: 97.7 F (36.5 C)   SpO2: 94%      Filed Weights   12/06/23 1400  Weight: 73.9 kg   5/4 needs weights recorded 9.  CAD/MI/CABG.  Follow-up cardiology services.  Currently maintained on  aspirin  and Plavix  10.  VT.  Last episode noted 2023 with successful ATP. 11.  CKD stage IIIA=  Baseline creatinine 1.3-1.5.  Follow-up chemistries, 4/30 creat creeping up associated with low intake and good output , enc po fluids, avoid IV if possible , no diuretics to adjust  12.  HIV.  Continue Dovato - followed at the [facility] 13.  Hyperlipidemia.  Lipitor/Zetia  14.  GERD.  Pepcid  15.  Constipation.  MiraLAX  daily,  Senokot as needed-    -last BM 5/2 16.  Dysphagia.  Patient notes coughing on current diet of mechanical soft.  Dysphagia 2 thin liquids and have speech therapy follow-up.   MBS and FEES looked ok  17. Daily HA's since stroke- tylenol  is working- gets up to 3-4/10- might need topamax for prevention  -headaches seem fairly mild  18.  Vomiting episode after liquid, not recurrent will monitor       LOS: 5 days A FACE TO FACE EVALUATION WAS PERFORMED  Rawland Caddy 12/11/2023, 10:23 AM

## 2023-12-12 DIAGNOSIS — I63511 Cerebral infarction due to unspecified occlusion or stenosis of right middle cerebral artery: Secondary | ICD-10-CM | POA: Diagnosis not present

## 2023-12-12 DIAGNOSIS — B20 Human immunodeficiency virus [HIV] disease: Secondary | ICD-10-CM | POA: Diagnosis not present

## 2023-12-12 DIAGNOSIS — I255 Ischemic cardiomyopathy: Secondary | ICD-10-CM | POA: Diagnosis not present

## 2023-12-12 DIAGNOSIS — I69391 Dysphagia following cerebral infarction: Secondary | ICD-10-CM | POA: Diagnosis not present

## 2023-12-12 MED ORDER — DICLOFENAC SODIUM 1 % EX GEL
2.0000 g | Freq: Four times a day (QID) | CUTANEOUS | Status: DC
  Administered 2023-12-12 – 2024-01-05 (×82): 2 g via TOPICAL
  Filled 2023-12-12 (×3): qty 100

## 2023-12-12 NOTE — Progress Notes (Signed)
 Speech Language Pathology Daily Session Note  Patient Details  Name: Richard [name] MRN: [id number] Date of Birth: 07-09-1956  Today's Date: 12/12/2023 SLP Individual Time: 1300-1356 SLP Individual Time Calculation (min): 56 min  Short Term Goals: Week 1: SLP Short Term Goal 1 (Week 1): Patient will utilize compensatory strategies during consumption of least restrictive diet with min multimodal A SLP Short Term Goal 2 (Week 1): Patient will increase speech intelligibility to 90% with use of compensatory strategies given min multimodal A SLP Short Term Goal 3 (Week 1): Patient will demonstrate problem solving skills during mildly complex functional situations given min multimodal A SLP Short Term Goal 4 (Week 1): Patient will recall and utilize memory compensatory strategies given min multimodal A SLP Short Term Goal 5 (Week 1): Patient will sustain attention to functional tasks for upwards of 15 minutes given min multimodal A SLP Short Term Goal 6 (Week 1): Patient will demonstrate emergent awareness of mistakes given min multimodal A  Skilled Therapeutic Interventions: Skilled therapy session focused on communication goals. SLP facilitated session by providing minA for patient to complete 5 sets of 5 repetitions o EMST (expiratory muscle strength training) set at 20cm H2O. SLP also evaluated lingual strength utilizing the IOPI ([place]  Oral Performance Instrument). Patient with an anterior and posterior lingual strength of approximately 10kpa. SLP continued to target speech intelligibility through review of SLOP (slow, loud, over articulate, pause) strategies. Patient required modA to utilize strategies and repeat sentences to increase intelligibility. Patient was approximately 80% intelligible at the sentence level this session. At the end of the session, SLP aided in completion of memory book. Patient left in chair with alarm set and call bell in reach. Continue POC.   Pain Pain in knees and  arm, nursing aware   Therapy/Group: Individual Therapy   Jillian Warth M.A., CCC-SLP 12/12/2023, 7:52 AM

## 2023-12-12 NOTE — Progress Notes (Signed)
 Occupational Therapy Session Note  Patient Details  Name: Richard [name] MRN: [id number] Date of Birth: 1956-05-17  Today's Date: 12/12/2023 OT Individual Time: 9629-5284 OT Individual Time Calculation (min): 41 min    Short Term Goals: Week 1:  OT Short Term Goal 1 (Week 1): Pt will maintain sitting balance min A during BADLs OT Short Term Goal 2 (Week 1): Pt will complete UB bathing with mod A using hemi techniques PRN OT Short Term Goal 3 (Week 1): Pt will donn shirt with mod A using hemi-techniques PRN OT Short Term Goal 4 (Week 1): Pt will tolerate sitting up in wc between therapy sessions 5/7 days OT Short Term Goal 5 (Week 1): Pt will complete toilet transfers with max A x1  Skilled Therapeutic Interventions/Progress Updates:     Pt received semi-reclined in bed presenting to be in good spirits receptive to skilled OT session reporting 5/10 pain in knees (informed MD)- OT offering intermittent rest breaks, repositioning, and therapeutic support to optimize participation in therapy session. Pt wearing resting hand splint upon OT arrival- removed splint at beginning of session with no skin irritation noted, Pt reporting he is tolerating sleeping in it well. Pt requesting to use restroom at beginning of session. Pt transitioned to EOB using log rolling technique to L with light mod A required to fully bring B LEs off EOB and lift trunk d/t Pt pushing to L with R UE +step  by step verbal cues for technique. Sitting EOB, Pt able to maintain sitting balance with CGA R UE supported in preparation for transfer (improvement from last week) with mod verbal cues provided for midline orientation. Utilized STEDY for transport to/from bathroom for increased opportunities to work on sit<>stands. Sit > stand in STEDY from EOB mod A +2. Pt able to maintain perched sitting position min A during transfers to elevated toilet seated. Pt note to have had incontinent BM in brief with increased time provided on  toilet for additional BM. Pt able to maintain L UE in safe position with min verbal cues and min A during toileting. 3/3 toileting tasks completed using STEDY with max A +2 min A for balance and safety. Pt pushing laterally to L when standing in STEDY with max multimodal cues required for midline orientation. Pt able to maintain static standing balance in STEDY mod A +2 during toileting tasks. U/LB dressing completed sitting on elevated toilet seat- Pt able to recall hemi-dressing technique with min questioning cues. Donned shirt with mod A and pants total A with Pt participating in weaving R LE and brining pants over knees. Positioned Pt at sink for grooming/hygiene tasks with Pt able to located 3/3 toiletry items positioned on L side of sink with min questioning cues. Education provided on hemiparetic UE recovery and positioning following CVA with Pt receptive to education- Pt able to safely position L UE on half lap tray following education. Pt was left resting in TISWC with call bell in reach, seatbelt alarm on, and all needs met.    Therapy Documentation Precautions:  Precautions Precautions: Fall, Other (comment) Precaution/Restrictions Comments: L hemiplegia with flaccidity, R pusher (can correct to pull with vc), dysarthria Splint/Cast - Date Prophylactic Dressing Applied (if applicable):  (PRafo ordered) Restrictions Weight Bearing Restrictions Per Provider Order: No  Therapy/Group: Individual Therapy  Geoffery Kiel 12/12/2023, 7:49 AM

## 2023-12-12 NOTE — Progress Notes (Signed)
 Speech Language Pathology Daily Session Note  Patient Details  Name: Richard [name] MRN: [id number] Date of Birth: March 24, 1956  Today's Date: 12/12/2023 SLP Individual Time: 4098-1191 SLP Individual Time Calculation (min): 45 min  Short Term Goals: Week 1: SLP Short Term Goal 1 (Week 1): Patient will utilize compensatory strategies during consumption of least restrictive diet with min multimodal A SLP Short Term Goal 2 (Week 1): Patient will increase speech intelligibility to 90% with use of compensatory strategies given min multimodal A SLP Short Term Goal 3 (Week 1): Patient will demonstrate problem solving skills during mildly complex functional situations given min multimodal A SLP Short Term Goal 4 (Week 1): Patient will recall and utilize memory compensatory strategies given min multimodal A SLP Short Term Goal 5 (Week 1): Patient will sustain attention to functional tasks for upwards of 15 minutes given min multimodal A SLP Short Term Goal 6 (Week 1): Patient will demonstrate emergent awareness of mistakes given min multimodal A  Skilled Therapeutic Interventions:   Pt greeted in his room, where he was awake/alert in his TIS chair. SLP assisted pt to the ST office for tx tasks targeting cognition and motor speech production. He benefited from only s cues for details when recalling events of the morning thus far. He then completed a visual organization task targeting problem solving, attention, and organization. He benefited from modA cues for sustained attention and minA cues for problem solving. Throughout task, he required s cues to repeat verbal responses and ensure ~95% intelligibility. During a structured speech production task at sentence level, he required minA to utilize over articulation and slow rate to maintain 95% intelligibility. Despite minA, he continues to present w/ slightly reduced intelligibility (90%) in conversational tasks given reduced context and reduced  generalization of speech strategies in conversation. At the end of tx tasks, he was assisted back to his room via TIS and left with the alarm set and call light within reach. Recommend cont ST.   Pain 3/10 headache. Bilateral knee pain (c/w soreness) as well. Nursing aware - see EMR for more info  Therapy/Group: Individual Therapy  Rozell Cornet 12/12/2023, 10:08 AM

## 2023-12-12 NOTE — Progress Notes (Signed)
 PROGRESS NOTE   Subjective/Complaints:  Pt sleepy even with the 5mg  dose of baclofen. Pt was drowsy prior to baclofen trial .THis was d/ced  Bilateral knee aching pain in the ams, no falls or trauma, no hx of surgery or cortisone injections  Xrays 05/10/22 Bilateral knees showing patellofemoral and mild medial compartment OA  ROS: Limited due to cognitive/behavioral    Objective:   No results found.  No results for input(s): "WBC", "HGB", "HCT", "PLT" in the last 72 hours.  No results for input(s): "NA", "K", "CL", "CO2", "GLUCOSE", "BUN", "CREATININE", "CALCIUM " in the last 72 hours.   Intake/Output Summary (Last 24 hours) at 12/12/2023 0936 Last data filed at 12/12/2023 0807 Gross per 24 hour  Intake 548 ml  Output 800 ml  Net -252 ml        Physical Exam: Vital Signs Blood pressure 105/80, pulse 72, temperature 98.4 F (36.9 C), resp. rate 18, height 5\' 10"  (1.778 m), weight 74.9 kg, SpO2 98%.   General: No acute distress Mood and affect are appropriate Heart: Regular rate and rhythm no rubs murmurs or extra sounds Lungs: Clear to auscultation, breathing unlabored, no rales or wheezes Abdomen: Positive bowel sounds, soft nontender to palpation, nondistended Extremities: No clubbing, cyanosis, or edema Skin: No evidence of breakdown, no evidence of rash Neurologic: slow to arouse but awakens and cooperates with exam. Answers questions. Cranial nerves II through XII intact, motor strength is 5/5 in right and 0/5 left  deltoid, bicep, tricep, grip, hip flexor, knee extensors, ankle dorsiflexor and plantar flexor.  Clonus at the left ankle  Sensory exam normal sensation to light touch right side and LLE absent LT LUE but feels pinch  Cerebellar exam unable to perform on left due to weakness  Musculoskeletal: Full range of motion in all 4 extremities. No joint swelling    Assessment/Plan: 1. Functional deficits  which require 3+ hours per day of interdisciplinary therapy in a comprehensive inpatient rehab setting. Physiatrist is providing close team supervision and 24 hour management of active medical problems listed below. Physiatrist and rehab team continue to assess barriers to discharge/monitor patient progress toward functional and medical goals  Care Tool:  Bathing    Body parts bathed by patient: Right arm, Left arm, Chest, Abdomen   Body parts bathed by helper: Left lower leg, Right lower leg, Right upper leg, Left upper leg, Buttocks, Front perineal area, Abdomen, Chest, Left arm, Right arm     Bathing assist Assist Level: 2 Helpers     Upper Body Dressing/Undressing Upper body dressing   What is the patient wearing?: Pull over shirt    Upper body assist Assist Level: Maximal Assistance - Patient 25 - 49%    Lower Body Dressing/Undressing Lower body dressing      What is the patient wearing?: Pants, Underwear/pull up     Lower body assist Assist for lower body dressing: Total Assistance - Patient < 25%     Toileting Toileting    Toileting assist Assist for toileting: 2 Helpers (simulated)     Transfers Chair/bed transfer  Transfers assist  Chair/bed transfer activity did not occur: Safety/medical concerns  Chair/bed transfer assist level: 2  Helpers (STEDY)     Locomotion Ambulation   Ambulation assist   Ambulation activity did not occur: Safety/medical concerns          Walk 10 feet activity   Assist  Walk 10 feet activity did not occur: Safety/medical concerns        Walk 50 feet activity   Assist Walk 50 feet with 2 turns activity did not occur: Safety/medical concerns         Walk 150 feet activity   Assist Walk 150 feet activity did not occur: Safety/medical concerns         Walk 10 feet on uneven surface  activity   Assist Walk 10 feet on uneven surfaces activity did not occur: Safety/medical concerns          Wheelchair     Assist Is the patient using a wheelchair?: Yes Type of Wheelchair: Manual    Wheelchair assist level: Dependent - Patient 0%      Wheelchair 50 feet with 2 turns activity    Assist        Assist Level: Dependent - Patient 0%   Wheelchair 150 feet activity     Assist      Assist Level: Dependent - Patient 0%   Blood pressure 105/80, pulse 72, temperature 98.4 F (36.9 C), resp. rate 18, height 5\' 10"  (1.778 m), weight 74.9 kg, SpO2 98%.  Medical Problem List and Plan: 1. 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             -patient may  shower             -ELOS/Goals: 14-18 days Min A   -Continue CIR therapies including PT, OT, and SLP  Severe CHF, changed to 15/7 , very fatigued after OT eval this am    PRAFO and WHO for LLE and LUE 2.  Antithrombotics: -DVT/anticoagulation: Eliquis              -antiplatelet therapy: ASA 81mg  daily 3. Pain Management/spasticity: Tylenol  as needed Post CVA HA Bilateral knee pain , aching in am s- OA noted on Xrays in 2023 start diclofenac gel   -emerging left sided tone.    -5/4 unable to tolerate even low dose baclofen--> stopped today   -outpt botox candidate 4. Mood/Behavior/Sleep: Provide emotional support             -antipsychotic agents: N/A 5. Neuropsych/cognition: This patient is capable of making decisions on his own behalf. 6. Skin/Wound Care: Routine skin checks 7. Fluids/Electrolytes/Nutrition: Routine in and outs with follow-up chemistries 8.  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  Followed by heart failure team.  Monitor for any signs of fluid overload.  Toprol  XL 12.5 mg daily.  Monitor for any hypotension Vitals:   12/11/23 1300 12/11/23 1936  BP: 110/87 105/80  Pulse: 84 72  Resp: 16 18  Temp: 98.5 F (36.9 C) 98.4  F (36.9 C)  SpO2: 100% 98%     Filed Weights   12/06/23 1400 12/12/23 0447 12/12/23 0500  Weight: 73.9 kg 74.9 kg 74.9 kg   5/4 needs weights recorded 9.  CAD/MI/CABG.  Follow-up cardiology services.  Currently maintained on aspirin  and Plavix  10.  VT.  Last episode noted 2023 with successful ATP. 11.  CKD stage IIIA=  Baseline creatinine 1.3-1.5.  Follow-up chemistries, 4/30 creat creeping up associated with low intake  and good output , enc po fluids, avoid IV if possible , no diuretics to adjust  12.  HIV.  Continue Dovato - followed at the [facility] 13.  Hyperlipidemia.  Lipitor/Zetia  14.  GERD.  Pepcid  15.  Constipation.  MiraLAX  daily, Senokot as needed-    -last BM 5/2 16.  Dysphagia.  Patient notes coughing on current diet of mechanical soft.  Dysphagia 2 thin liquids and have speech therapy follow-up.   MBS and FEES looked ok  17. Daily HA's since stroke- tylenol  is working- gets up to 3-4/10- might need topamax for prevention  -headaches seem fairly mild       LOS: 6 days A FACE TO FACE EVALUATION WAS PERFORMED  Genetta Kenning 12/12/2023, 9:36 AM

## 2023-12-12 NOTE — Progress Notes (Signed)
 Physical Therapy Session Note  Patient Details  Name: Richard [name] MRN: [id number] Date of Birth: [birth year]  Today's Date: 12/12/2023 PT Individual Time: 1407-1500 PT Individual Time Calculation (min): 53 min   Short Term Goals: Week 1:  PT Short Term Goal 1 (Week 1): Pt will perform bed mobility with ModA +2. PT Short Term Goal 2 (Week 1): Pt will maintain seated balance with decreased push of RUE and increased trunk activation to maintain upright position with overall ModA for up to . PT Short Term Goal 3 (Week 1): Pt will perform sit<>stand using STEDY with MinA. PT Short Term Goal 4 (Week 1): Pt will initiate gait training  Skilled Therapeutic Interventions/Progress Updates:  Patient seated upright in TIS w/c on entrance to room. Patient alert and agreeable to PT session but asks when he can get back into bed. Related to pt the plan for session and then to return to bed. Requested warm blankets.    Patient with no pain complaint at start of session.  E- Stim: Applied 4"x2" neuromuscular electrical stimulation (NMES) to LLE quadriceps muscle group during activation for LAQ. NMR interventions below using Empi e-stim device set on NMES for large muscle groups. Intensity set to 20mA with visual/ palpable muscle contraction. Ramp 2sec, on for 10sec and off for 2sec. After completion of e-stim, pt denies any negative side effects with skin intact and no adverse side effects noted.   Therapeutic Activity: Bed Mobility: Pt performed supine <> sit with ***. VC/ tc required for ***. Transfers: Pt performed sit<>stand and stand pivot transfers throughout session with ***. Provided vc/ tc for***.  Gait Training:  Pt ambulated *** ft using *** with ***. Demonstrated ***. Provided vc/ tc for ***.  Wheelchair Mobility:  Pt propelled wheelchair *** feet with ***. Provided vc/ tc for ***.  Neuromuscular Re-ed: NMR facilitated during session with focus on ***. Pt guided in ***. NMR  performed for improvements in motor control and coordination, balance, sequencing, judgement, and self confidence/ efficacy in performing all aspects of mobility at highest level of independence.   Therapeutic Exercise: Pt performed the following exercises with vc/ tc for proper technique. ***  Patient *** at end of session with brakes locked, *** alarm set, and all needs within reach.   Therapy Documentation Precautions:  Precautions Precautions: Fall, Other (comment) Precaution/Restrictions Comments: L hemiplegia with flaccidity, R pusher (can correct to pull with vc), dysarthria Splint/Cast - Date Prophylactic Dressing Applied (if applicable):  (PRafo ordered) Restrictions Weight Bearing Restrictions Per Provider Order: No General:   Vital Signs:   Pain: Pain Assessment Pain Scale: 0-10 Pain Score: 4  Pain Location: Back Mobility:   Locomotion :    Trunk/Postural Assessment :    Balance:   Exercises:   Other Treatments:      Therapy/Group: {Therapy/Group:[id number]}  Donne Gage 12/12/2023, 2:03 PM

## 2023-12-13 DIAGNOSIS — B20 Human immunodeficiency virus [HIV] disease: Secondary | ICD-10-CM | POA: Diagnosis not present

## 2023-12-13 DIAGNOSIS — I255 Ischemic cardiomyopathy: Secondary | ICD-10-CM | POA: Diagnosis not present

## 2023-12-13 DIAGNOSIS — I63511 Cerebral infarction due to unspecified occlusion or stenosis of right middle cerebral artery: Secondary | ICD-10-CM | POA: Diagnosis not present

## 2023-12-13 DIAGNOSIS — I69391 Dysphagia following cerebral infarction: Secondary | ICD-10-CM | POA: Diagnosis not present

## 2023-12-13 LAB — CREATININE, SERUM
Creatinine, Ser: 1.55 mg/dL — ABNORMAL HIGH (ref 0.61–1.24)
GFR, Estimated: 49 mL/min — ABNORMAL LOW (ref >60)

## 2023-12-13 MED ORDER — SENNOSIDES-DOCUSATE SODIUM 8.6-50 MG PO TABS
2.0000 | ORAL_TABLET | Freq: Two times a day (BID) | ORAL | Status: DC
  Administered 2023-12-13 – 2023-12-31 (×34): 2 via ORAL
  Filled 2023-12-13 (×37): qty 2

## 2023-12-13 NOTE — Progress Notes (Addendum)
 PROGRESS NOTE   Subjective/Complaints:  Tired this am but no c/os, follows simple commands and answers Y/N questions   Not having daily BMs , no abd pain  Xrays 05/10/22 Bilateral knees showing patellofemoral and mild medial compartment OA  ROS: Limited due to cognitive/behavioral    Objective:   No results found.  No results for input(s): "WBC", "HGB", "HCT", "PLT" in the last 72 hours.  Recent Labs    12/13/23 0508  CREATININE 1.55*     Intake/Output Summary (Last 24 hours) at 12/13/2023 0815 Last data filed at 12/12/2023 2200 Gross per 24 hour  Intake 574 ml  Output 800 ml  Net -226 ml        Physical Exam: Vital Signs Blood pressure 101/74, pulse 66, temperature 98.1 F (36.7 C), resp. rate 17, height 5\' 10"  (1.778 m), weight 76 kg, SpO2 99%.   General: No acute distress Mood and affect are appropriate Heart: Regular rate and rhythm no rubs murmurs or extra sounds Lungs: Clear to auscultation, breathing unlabored, no rales or wheezes Abdomen: Positive bowel sounds, soft nontender to palpation, nondistended Extremities: No clubbing, cyanosis, or edema Skin: No evidence of breakdown, no evidence of rash Neurologic: slow to arouse but awakens and cooperates with exam. Answers questions. Cranial nerves II through XII intact, motor strength is 5/5 in right and 0/5 left  deltoid, bicep, tricep, grip, hip flexor, knee extensors, ankle dorsiflexor and plantar flexor.  Clonus at the left ankle  Sensory exam normal sensation to light touch right side and LLE absent LT LUE but feels pinch  Cerebellar exam unable to perform on left due to weakness  Musculoskeletal: Full range of motion in all 4 extremities. No joint swelling    Assessment/Plan: 1. Functional deficits which require 3+ hours per day of interdisciplinary therapy in a comprehensive inpatient rehab setting. Physiatrist is providing close team  supervision and 24 hour management of active medical problems listed below. Physiatrist and rehab team continue to assess barriers to discharge/monitor patient progress toward functional and medical goals  Care Tool:  Bathing    Body parts bathed by patient: Right arm, Left arm, Chest, Abdomen   Body parts bathed by helper: Left lower leg, Right lower leg, Right upper leg, Left upper leg, Buttocks, Front perineal area, Abdomen, Chest, Left arm, Right arm     Bathing assist Assist Level: 2 Helpers     Upper Body Dressing/Undressing Upper body dressing   What is the patient wearing?: Pull over shirt    Upper body assist Assist Level: Maximal Assistance - Patient 25 - 49%    Lower Body Dressing/Undressing Lower body dressing      What is the patient wearing?: Pants, Underwear/pull up     Lower body assist Assist for lower body dressing: Total Assistance - Patient < 25%     Toileting Toileting    Toileting assist Assist for toileting: 2 Helpers (simulated)     Transfers Chair/bed transfer  Transfers assist  Chair/bed transfer activity did not occur: Safety/medical concerns  Chair/bed transfer assist level: 2 Helpers (STEDY)     Locomotion Ambulation   Ambulation assist   Ambulation activity did not occur: Safety/medical  concerns          Walk 10 feet activity   Assist  Walk 10 feet activity did not occur: Safety/medical concerns        Walk 50 feet activity   Assist Walk 50 feet with 2 turns activity did not occur: Safety/medical concerns         Walk 150 feet activity   Assist Walk 150 feet activity did not occur: Safety/medical concerns         Walk 10 feet on uneven surface  activity   Assist Walk 10 feet on uneven surfaces activity did not occur: Safety/medical concerns         Wheelchair     Assist Is the patient using a wheelchair?: Yes Type of Wheelchair: Manual    Wheelchair assist level: Dependent - Patient  0%      Wheelchair 50 feet with 2 turns activity    Assist        Assist Level: Dependent - Patient 0%   Wheelchair 150 feet activity     Assist      Assist Level: Dependent - Patient 0%   Blood pressure 101/74, pulse 66, temperature 98.1 F (36.7 C), resp. rate 17, height 5\' 10"  (1.778 m), weight 76 kg, SpO2 99%.  Medical Problem List and Plan: 1. 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             -patient may  shower             -ELOS/Goals: 14-18 days Min A   -Continue CIR therapies including PT, OT, and SLP  Severe CHF, changed to 15/7 , very fatigued after OT eval this am    PRAFO and WHO for LLE and LUE 2.  Antithrombotics: -DVT/anticoagulation: Eliquis              -antiplatelet therapy: ASA 81mg  daily 3. Pain Management/spasticity: Tylenol  as needed Post CVA HA Bilateral knee pain , aching in am s- OA noted on Xrays in 2023 start diclofenac gel   -emerging left sided tone.    -5/4 unable to tolerate even low dose baclofen--> stopped    -outpt botox candidate 4. Mood/Behavior/Sleep: Provide emotional support             -antipsychotic agents: N/A 5. Neuropsych/cognition: This patient is capable of making decisions on his own behalf. 6. Skin/Wound Care: Routine skin checks 7. Fluids/Electrolytes/Nutrition: Routine in and outs with follow-up chemistries 8.  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  Followed by heart failure team.  Monitor for any signs of fluid overload.  Toprol  XL 12.5 mg daily.  Monitor for any hypotension Vitals:   12/12/23 1935 12/13/23 0410  BP: 110/86 101/74  Pulse: 79 66  Resp: 17 17  Temp: 98.2 F (36.8 C) 98.1 F (36.7 C)  SpO2: 100% 99%     Filed Weights   12/12/23 0447 12/12/23 0500 12/13/23 0458  Weight: 74.9 kg 74.9 kg 76 kg   5/4 needs weights  recorded 9.  CAD/MI/CABG.  Follow-up cardiology services.  Currently maintained on aspirin  and Plavix  10.  VT.  Last episode noted 2023 with successful ATP. 11.  CKD stage IIIA=  Baseline creatinine 1.3-1.5.  Follow-up chemistries, 4/30 creat creeping up associated with low intake and good output , enc po fluids, avoid IV if possible , no diuretics to adjust  12.  HIV.  Continue Dovato - followed at the [facility] 13.  Hyperlipidemia.  Lipitor/Zetia  14.  GERD.  Pepcid  15.  Constipation.  MiraLAX  daily, Senokot changed to 2 po BID    -last 2 BM 5/2 and 5/5 16.  Dysphagia.  Patient notes coughing on current diet of mechanical soft.  Dysphagia 2 thin liquids and have speech therapy follow-up.   MBS and FEES looked ok  17. Daily HA's since stroke- tylenol  is working- gets up to 3-4/10- might need topamax for prevention  -headaches seem fairly mild       LOS: 7 days A FACE TO FACE EVALUATION WAS PERFORMED  Genetta Kenning 12/13/2023, 8:15 AM

## 2023-12-13 NOTE — Progress Notes (Signed)
 Speech Language Pathology Daily Session Note  Patient Details  Name: Richard [name] MRN: [id number] Date of Birth: 1956/06/10  Today's Date: 12/13/2023 SLP Individual Time: 1400-1447 SLP Individual Time Calculation (min): 47 min  Short Term Goals: [name] 1: SLP Short Term Goal 1 ([name] 1): Patient will utilize compensatory strategies during consumption of least restrictive diet with min multimodal A SLP Short Term Goal 2 ([name] 1): Patient will increase speech intelligibility to 90% with use of compensatory strategies given min multimodal A SLP Short Term Goal 3 ([name] 1): Patient will demonstrate problem solving skills during mildly complex functional situations given min multimodal A SLP Short Term Goal 4 ([name] 1): Patient will recall and utilize memory compensatory strategies given min multimodal A SLP Short Term Goal 5 ([name] 1): Patient will sustain attention to functional tasks for upwards of 15 minutes given min multimodal A SLP Short Term Goal 6 ([name] 1): Patient will demonstrate emergent awareness of mistakes given min multimodal A  Skilled Therapeutic Interventions: Skilled therapy session focused on communication and cognitive goals. SLP facilitated session by providing supervision A for use of EMST (expiratory muscle strength training) set at 20cm H2O. Patient completed 5 sets of 5 repetitions. Patient independently recalled SLOP (slow, loud, over articulate, pause) speech strategies and utilized them with minA during functional role playing conversational scenarios to achieve 90% intelligibility. SLP targeted cognitive goals through review of WRAP (write, repeat, associate picture) memory strategies and completion of memory book. Patient recalled general activities completed in 3/4 sessions, however unable to recall 1/4. At the end of the session, patient requesting transfer to [facility] for bowel movement. Remaining 13 minutes of session missed due to toileting. SLP will attempt to return as  patient and therapist schedule allows. Continue POC  Pain Ear pain, medical team aware  Therapy/Group: Individual Therapy  Jaymes Hang M.A., CCC-SLP 12/13/2023, 7:37 AM

## 2023-12-13 NOTE — Progress Notes (Signed)
 Occupational Therapy Session Note  Patient Details  Name: Richard [name] MRN: [id number] Date of Birth: 27-Nov-1955  Today's Date: 12/13/2023 OT Individual Time: 4098-1191 OT Individual Time Calculation (min): 72 min    Short Term Goals: Week 1:  OT Short Term Goal 1 (Week 1): Pt will maintain sitting balance min A during BADLs OT Short Term Goal 2 (Week 1): Pt will complete UB bathing with mod A using hemi techniques PRN OT Short Term Goal 3 (Week 1): Pt will donn shirt with mod A using hemi-techniques PRN OT Short Term Goal 4 (Week 1): Pt will tolerate sitting up in wc between therapy sessions 5/7 days OT Short Term Goal 5 (Week 1): Pt will complete toilet transfers with max A x1  Skilled Therapeutic Interventions/Progress Updates:     Pt received semi-reclined in bed presenting to be in good spirits receptive to skilled OT session reporting 0/10 pain- OT offering intermittent rest breaks, repositioning, and therapeutic support to optimize participation in therapy session. Pt requesting to take shower this AM- focused session on midline orientation, dynamic sitting balance, transitional movement patterns, and L UE positioning within context of shower level BADLs. +2 A present throughout session to assist with transfers. Pt transitioned to EOB using log rolling and posterior hooking method to bring B LEs off EOB with light mod A. Once sitting EOB, Pt able to maintain sitting balance with CGA in preparation for STEDY transfer with verbal cues provided for midline orientation!! Pt able to complete sit<>stands in STEDY with mod A x2 during session today from both EOB and BSC with verbal cues required for midline orientation, trunk positioning, and to decrease lateral push to L. U/LB bathing completed in walk-in shower seated on BSC to allow for improved positioning and stability in seated position. Pt able to follow verbal cues to wash UB with assistance required for L underarm and R UE. Pt able  to wash anterior peri-areas, upper legs, and lateral portion of buttocks. Assistance required to wash lower B LEs and fully wash buttocks. Pt maintained seated position with CGA and mod verbal cues for trunk positioning during shower. Education provided on L UE positioning during bathing tasks with Pt able to safely position arm with min A. U/LB dressing completed seated on BSC using hemi-techniques. Pt able to donn shirt with min A to set-up shirt and weave L UE. Assistance required to weave L LE and bring pants to waist during LB dressing. Grooming/hygiene tasks completed seated in TISWC with education provided on hemi-techniques- Pt able to brush teeth with set-up A utilizing L UE as a stabilizer for toiletry items and shave face using electric razor with mod A d/t fatigue. Pt was left resting in TISWC with call bell i reach, seatbelt alarm on, and all needs met.    Therapy Documentation Precautions:  Precautions Precautions: Fall, Other (comment) Precaution/Restrictions Comments: L hemiplegia with flaccidity, R pusher (can correct to pull with vc), dysarthria Splint/Cast - Date Prophylactic Dressing Applied (if applicable):  (PRafo ordered) Restrictions Weight Bearing Restrictions Per Provider Order: No   Therapy/Group: Individual Therapy  Geoffery Kiel 12/13/2023, 7:57 AM

## 2023-12-13 NOTE — Progress Notes (Signed)
 Physical Therapy Session Note  Patient Details  Name: Richard [name] MRN: [id number] Date of Birth: 12/11/1955  Today's Date: 12/13/2023 PT Individual Time: 1131-1156 PT Individual Time Calculation (min): 25 min   Short Term Goals: Week 1:  PT Short Term Goal 1 (Week 1): Pt will perform bed mobility with ModA +2. PT Short Term Goal 2 (Week 1): Pt will maintain seated balance with decreased push of RUE and increased trunk activation to maintain upright position with overall ModA for up to . PT Short Term Goal 3 (Week 1): Pt will perform sit<>stand using STEDY with MinA. PT Short Term Goal 4 (Week 1): Pt will initiate gait training  Skilled Therapeutic Interventions/Progress Updates:   Received pt sitting in TIS WC, pt agreeable to PT treatment, and reported pain 5/10 in low back and knees as well as headache - RN notified and present to administer medication. Session with emphasis on functional mobility/transfers, dynamic standing balance/coordination, and NMR. Worked on sit<>stands, standing tolerance, and static standing balance/midline orientation in [place] (min A to stand). Noted pt with tendency to sit quickly with poor eccentric control. Placed pt in front of mirror for visual feedback and pt with improvements in ability to correct pushing to L. Pt stood for 10 seconds x 1, 16 seconds x1, 21 seconds x 1, and 29 seconds x 1. Concluded session with pt sitting in TIS WC, needs within reach, and seatbelt alarm on.   Therapy Documentation Precautions:  Precautions Precautions: Fall, Other (comment) Precaution/Restrictions Comments: L hemiplegia with flaccidity, R pusher (can correct to pull with vc), dysarthria Splint/Cast - Date Prophylactic Dressing Applied (if applicable):  (PRafo ordered) Restrictions Weight Bearing Restrictions Per Provider Order: No  Therapy/Group: Individual Therapy Nicolas Barren Zaunegger Nena Bank PT, DPT 12/13/2023, 8:42 AM

## 2023-12-13 NOTE — Progress Notes (Signed)
 Physical Therapy Session Note  Patient Details  Name: Richard [name] MRN: [id number] Date of Birth: 09-26-1955  Today's Date: 12/13/2023 PT Individual Time: 6578-4696 PT Individual Time Calculation (min): 26 min   Short Term Goals: Week 1:  PT Short Term Goal 1 (Week 1): Pt will perform bed mobility with ModA +2. PT Short Term Goal 2 (Week 1): Pt will maintain seated balance with decreased push of RUE and increased trunk activation to maintain upright position with overall ModA for up to . PT Short Term Goal 3 (Week 1): Pt will perform sit<>stand using STEDY with MinA. PT Short Term Goal 4 (Week 1): Pt will initiate gait training  Skilled Therapeutic Interventions/Progress Updates: Patient sitting in TIS on entrance to room. Patient alert and agreeable to PT session.   Patient with unrated pain in B knees (has improved after nsg applied topical medication to assist).  Applied L quadriceps (rectus femoris and VMO) muscles [place] e-stim device set on NMES L Atrophy - intensity 35 with palpable muscle contraction - after completion of e-stim, pt denies any negative side effects with skin intact and no adverse side effects noted. Pt unable to tolerate more than 35 intensity. PTA assisted at first with achievement of LAQ, then pt able to do so without assistance and max cuing to maintain in close to full extension.   NMR performed for improvements in motor control and coordination, balance, sequencing, judgement, and self confidence/ efficacy in performing all aspects of mobility at highest level of independence.   Pt with ability to perform LAQ following E-stim while sitting in TIS. Pt performed x 10 reps (3-).  Patient sitting in TIS at end of session with brakes locked, belt alarm set, and all needs within reach.      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

## 2023-12-13 NOTE — Progress Notes (Addendum)
 Patient c/o left ear pain when he would open his jaw , sharp, wanted pain medication but has been given pain meds at 1130. Pa was informed.

## 2023-12-14 DIAGNOSIS — B20 Human immunodeficiency virus [HIV] disease: Secondary | ICD-10-CM | POA: Diagnosis not present

## 2023-12-14 DIAGNOSIS — I69391 Dysphagia following cerebral infarction: Secondary | ICD-10-CM | POA: Diagnosis not present

## 2023-12-14 DIAGNOSIS — I63511 Cerebral infarction due to unspecified occlusion or stenosis of right middle cerebral artery: Secondary | ICD-10-CM | POA: Diagnosis not present

## 2023-12-14 DIAGNOSIS — I255 Ischemic cardiomyopathy: Secondary | ICD-10-CM | POA: Diagnosis not present

## 2023-12-14 NOTE — Progress Notes (Signed)
 Occupational Therapy Weekly Progress Note  Patient Details  Name: Richard [name] MRN: [id number] Date of Birth: 29-Jun-1956  Beginning of progress report period: December 07, 2023 End of progress report period: Dec 14, 2023  Today's Date: 12/14/2023 OT Individual Time: 1478-2956 OT Individual Time Calculation (min): 75 min    Patient has met 4 of 5 short term goals. Richard [name] is making appropriate progress towards reaching his LTGs. He is completing UB bath/dressing with mod A, LB bathing max A, LB dressing max A at bed level, toileting total A, and squat pivot transfers mod/max A x2. He is demonstrating improved sitting balance and able to maintain sitting position with R UE support min A. Pt fatigues quickly, however is motivated to participate in therapy sessions. He is planning to return home with assistance from his wife.   Patient continues to demonstrate the following deficits: muscle weakness and muscle joint tightness, decreased cardiorespiratoy endurance, abnormal tone, unbalanced muscle activation, decreased coordination, and decreased motor planning, decreased attention to left and decreased motor planning, decreased attention, decreased awareness, decreased safety awareness, and decreased memory, central origin, and decreased sitting balance, decreased standing balance, decreased postural control, hemiplegia, and decreased balance strategies and therefore will continue to benefit from skilled OT intervention to enhance overall performance with BADL and Reduce care partner burden.  Patient progressing toward long term goals..  Continue plan of care.  OT Short Term Goals Week 1:  OT Short Term Goal 1 (Week 1): Pt will maintain sitting balance min A during BADLs OT Short Term Goal 1 - Progress (Week 1): Met OT Short Term Goal 2 (Week 1): Pt will complete UB bathing with mod A using hemi techniques PRN OT Short Term Goal 2 - Progress (Week 1): Met OT Short Term Goal 3 (Week 1): Pt  will donn shirt with mod A using hemi-techniques PRN OT Short Term Goal 3 - Progress (Week 1): Met OT Short Term Goal 4 (Week 1): Pt will tolerate sitting up in wc between therapy sessions 5/7 days OT Short Term Goal 4 - Progress (Week 1): Met OT Short Term Goal 5 (Week 1): Pt will complete toilet transfers with max A x1 OT Short Term Goal 5 - Progress (Week 1): Progressing toward goal Week 2:  OT Short Term Goal 1 (Week 2): Pt will complete toilet transfers with max A x1 OT Short Term Goal 2 (Week 2): Pt will maintain L UE in safe position when sitting up in wc with min verbal cues OT Short Term Goal 3 (Week 2): Pt will complete squat pivot transfers with max A x1 OT Short Term Goal 4 (Week 2): Pt will maintain sitting balance in preparation for functional transfers with CGA consistently  Skilled Therapeutic Interventions/Progress Updates:     Pt received sitting up in bed with nursing staff present in room. Pt starting to eat his breakfast upon OT arrival. Pt presenting to be alert, in good spirits receptive to skilled OT session reporting 0/10 pain- OT offering intermittent rest breaks, repositioning, and therapeutic support to optimize participation in therapy session. Focused this session on BADL retraining, transitional movement patterns, and L hemibody NMRE.   Spent time working on visual scanning and L side attention within self-feeding. Positioning tray table at Pt's midline and towards the L to promote crossing midline to retrieve food items- Pt able to feed self with supervision +increased time and locate food items with min questioning cues.   Donned pants bed level using hemi techniques- Pt  able to recall technique following single questioning cue. Assistance provided to weave L LE with Pt them able to weave R LE min A and bring pants to waist by bridging hips with min A to fully adjust pants over L hip.   Engaged Pt in completing bridges in bed as preparatory exercises to engage L LE  prior to completing bed mobility. Pt able to fully bridge hips with min A provide to stabilize L LE. Pt able to recall log rolling and posterior hooking technique- mod A required to fully bring B LEs off EOM and min A to lift trunk. Once sitting EOB Pt able to maintain sitting balance min A. Re-educated Pt on squat pivot transfer technique with Pt then able to complete transfer to R with mod A +2 min A and max verbal cues.   Transported Pt total A to therapy gym in [facility]. Squat pivot to R transfer to R with mod A +2 min A and max verbal cues. Pt benefits from using external visual cues of brining his chin to OT's shoulder to facilitate increased trunk flexion.   Sitting EOB, positioned large vertical mirror anterior to Pt for increased visual feedback of body positioning. Worked on trunk control, crossing midline, anterior trunk flexion, and dynamic sitting balance. Pt initially tasked with completing partial sit-ups lifting trunk from therball and anteriorly flexing trunk to bring nose over toes- max A initially required, fading to min/mod A with increased practice. Graded task by increasing distance of therball from posterior trunk of Pt. Pt then tasked with maintaining sitting balance while changing gaze to look to L to locate squigz and using R UE to reach across midline to retrieve squigz and transport it to bucket on Pt's R side- facilitated WB'ing into L UE during task with L UE supported on mat. Pt required max A initially to correct balance following lateral weight shift, fading to mod A with increased time and practice.   Pt completed squat pivot to R EOM>wc with mod A +2 min A to guide hips. Pt was left resting in TISWC with call bell in reach, seatbelt alarm on, and all needs met.    Therapy Documentation Precautions:  Precautions Precautions: Fall, Other (comment) Precaution/Restrictions Comments: L hemiplegia with flaccidity, R pusher (can correct to pull with vc), dysarthria Splint/Cast -  Date Prophylactic Dressing Applied (if applicable):  (PRafo ordered) Restrictions Weight Bearing Restrictions Per Provider Order: No  Therapy/Group: Individual Therapy  Geoffery Kiel 12/14/2023, 12:18 PM

## 2023-12-14 NOTE — Patient Care Conference (Signed)
 Inpatient RehabilitationTeam Conference and Plan of Care Update Date: 12/14/2023   Time: 10:47 AM    Patient Name: Richard [name]      Medical Record Number: [id number]  Date of Birth: 09-Oct-1955 Sex: Male         Room/Bed: 4W25C/4W25C-01 Payor Info: Payor: [employer] / Plan: [facility] / Product Type: *No Product type* /    Admit Date/Time:  12/06/2023  1:00 PM  Primary Diagnosis:  Right middle cerebral artery stroke [facility])  Hospital Problems: Principal Problem:   Right middle cerebral artery stroke [facility])    Expected Discharge Date: Expected Discharge Date: 01/05/24  Team Members Present: Physician leading conference: Dr. Janeece Mechanic Social Worker Present: Adrianna Albee, LCSW Nurse Present: Forrestine Ike, RN PT Present: Nena Bank, PT OT Present: Florina Husbands, OT SLP Present: Reggie Caper, SLP PPS Coordinator present : Jestine Moron, SLP     Current Status/Progress Goal Weekly Team Focus  Bowel/Bladder      Incontinent episodes    Continent with toileting    Toileting protocol activation  Swallow/Nutrition/ Hydration   D2/thin   supervision  tolerance and use of strategies, education    ADL's   improved sitting balance min A EOB in preparation for transfers when not fatigued and R UE supported, UB bathe/dressing mod A, LB bathe/dressing max A +2 when standing to pull up pants, toileting total A +2; tone noted in L bicep at midrange and in pectoral muscles   min A overall   L hemibody NMRE, dynamic balance, midline orienation, activity tolerance, BADL retraining, L side attention, functional cognition, transitional movement patterns, Pt education // Barriers: functional cognition, pusher syndrome, L hemiplegia, trunk control, L UE functional use, L inattention    Mobility   Bed mobility = Mod/ MaxA, Transfers = MaxA, Ambulation = using HR in hallway and requires MaxA for LLE advancement and block from buckling, no stairs, no  w/c mobility   Min/ Mod A overall  Barriers: L hemipareisis, impulsiveness, decreased awareness/ attn /// Work On: NMR for L hemibody, progressing ambulation, sitting/ standing balance with focus on midline orientation and proprioception, motor control of LLE with progressing estim, pt education    Communication   ~80-90% intelligibile at the conversational level given minA   90% at conversational level given supervisionA   education on strategies and use in functional scenarios    Safety/Cognition/ Behavioral Observations  min-modA   supervision   memory, problem solving, awareness, attention    Pain      N/a          Skin      N/a           Discharge Planning:  WIfe has been here and observed in therapies wants to take him home. Joanie Mt is if she can prvide the care he will require at discharge.   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  Patient on target to meet rehab goals: yes, currently needs mod assist for upper body care and max assist for lower body care and toileting. Needs mod assist + 2 for squat pivot transfers. Min - mod cognitive deficits; trouble with simple problem solving. Goals for discharge set for min - mod assist overall.  *See Care Plan and progress notes for long and short-term goals.   Revisions to Treatment Plan:  Sleep chart E-stim   Teaching Needs: Safety, medications, transfers, toileting, dietary modification, etc.  Current Barriers to Discharge: Decreased caregiver support and Home enviroment access/layout  Possible Resolutions to Barriers: Family education [facility] service request DME: St[employer] and custom wheelchair     Medical Summary Current Status: BP on low side, still has severe weakness on the Left side, hx HIV, on antiretroviral meds  Barriers to Discharge: Medical stability;Cardiac Complications   Possible  Resolutions to [employer] Focus: level of alertness improving, will need extensive caregiver training +/- Steadi   Continued Need for Acute Rehabilitation Level of Care: The patient requires daily medical management by a physician with specialized training in physical medicine and rehabilitation for the following reasons: Direction of a multidisciplinary physical rehabilitation program to maximize functional independence : Yes Medical management of patient stability for increased activity during participation in an intensive rehabilitation regime.: Yes Analysis of laboratory values and/or radiology reports with any subsequent need for medication adjustment and/or medical intervention. : Yes   I attest that I was present, lead the team conference, and concur with the assessment and plan of the team.   Forrestine Ike B 12/14/2023, 4:22 PM

## 2023-12-14 NOTE — Progress Notes (Signed)
 PROGRESS NOTE   Subjective/Complaints:  More awake today , knees feel much better   ROS: Limited due to cognitive/behavioral    Objective:   No results found.  No results for input(s): "WBC", "HGB", "HCT", "PLT" in the last 72 hours.  Recent Labs    12/13/23 0508  CREATININE 1.55*     Intake/Output Summary (Last 24 hours) at 12/14/2023 0817 Last data filed at 12/14/2023 0445 Gross per 24 hour  Intake 240 ml  Output 875 ml  Net -635 ml        Physical Exam: Vital Signs Blood pressure 94/62, pulse 62, temperature 98.2 F (36.8 C), resp. rate 18, height 5\' 10"  (1.778 m), weight 76 kg, SpO2 96%.   General: No acute distress Mood and affect are appropriate Heart: Regular rate and rhythm no rubs murmurs or extra sounds Lungs: Clear to auscultation, breathing unlabored, no rales or wheezes Abdomen: Positive bowel sounds, soft nontender to palpation, nondistended Extremities: No clubbing, cyanosis, or edema Skin: No evidence of breakdown, no evidence of rash Neurologic: slow to arouse but awakens and cooperates with exam. Answers questions. Cranial nerves II through XII intact, motor strength is 5/5 in right and 0/5 left  deltoid, bicep, tricep, grip, hip flexor, knee extensors, ankle dorsiflexor and plantar flexor.  Clonus at the left ankle  Sensory exam normal sensation to light touch right side and LLE absent LT LUE but feels pinch  Cerebellar exam unable to perform on left due to weakness  Musculoskeletal: Full range of motion in all 4 extremities. No joint swelling  Assessment/Plan: 1. Functional deficits which require 3+ hours per day of interdisciplinary therapy in a comprehensive inpatient rehab setting. Physiatrist is providing close team supervision and 24 hour management of active medical problems listed below. Physiatrist and rehab team continue to assess barriers to discharge/monitor patient progress  toward functional and medical goals  Care Tool:  Bathing    Body parts bathed by patient: Left arm, Chest, Abdomen, Front perineal area, Right upper leg, Left upper leg, Face   Body parts bathed by helper: Right arm, Buttocks, Left lower leg, Right lower leg, Left arm     Bathing assist Assist Level: Maximal Assistance - Patient 24 - 49%     Upper Body Dressing/Undressing Upper body dressing   What is the patient wearing?: Pull over shirt    Upper body assist Assist Level: Moderate Assistance - Patient 50 - 74%    Lower Body Dressing/Undressing Lower body dressing      What is the patient wearing?: Pants, Underwear/pull up     Lower body assist Assist for lower body dressing: Total Assistance - Patient < 25%     Toileting Toileting    Toileting assist Assist for toileting: 2 Helpers     Transfers Chair/bed transfer  Transfers assist  Chair/bed transfer activity did not occur: Safety/medical concerns  Chair/bed transfer assist level: 2 Helpers (STEDY)     Locomotion Ambulation   Ambulation assist   Ambulation activity did not occur: Safety/medical concerns          Walk 10 feet activity   Assist  Walk 10 feet activity did not occur: Safety/medical concerns  Walk 50 feet activity   Assist Walk 50 feet with 2 turns activity did not occur: Safety/medical concerns         Walk 150 feet activity   Assist Walk 150 feet activity did not occur: Safety/medical concerns         Walk 10 feet on uneven surface  activity   Assist Walk 10 feet on uneven surfaces activity did not occur: Safety/medical concerns         Wheelchair     Assist Is the patient using a wheelchair?: Yes Type of Wheelchair: Manual    Wheelchair assist level: Dependent - Patient 0%      Wheelchair 50 feet with 2 turns activity    Assist        Assist Level: Dependent - Patient 0%   Wheelchair 150 feet activity     Assist       Assist Level: Dependent - Patient 0%   Blood pressure 94/62, pulse 62, temperature 98.2 F (36.8 C), resp. rate 18, height 5\' 10"  (1.778 m), weight 76 kg, SpO2 96%.  Medical Problem List and Plan: 1. 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             -patient may  shower             -ELOS/Goals: 14-18 days Min A   -Continue CIR therapies including PT, OT, and SLP  Severe CHF, changed to 15/7   PRAFO and WHO for LLE and LUE 2.  Antithrombotics: -DVT/anticoagulation: Eliquis              -antiplatelet therapy: ASA 81mg  daily 3. Pain Management/spasticity: Tylenol  as needed Post CVA HA Bilateral knee pain , aching in am s- OA noted on Xrays in 2023 start diclofenac gel   -emerging left sided tone.    -5/4 unable to tolerate even low dose baclofen--> stopped    -outpt botox candidate 4. Mood/Behavior/Sleep: Provide emotional support             -antipsychotic agents: N/A 5. Neuropsych/cognition: This patient is capable of making decisions on his own behalf. 6. Skin/Wound Care: Routine skin checks 7. Fluids/Electrolytes/Nutrition: Routine in and outs with follow-up chemistries 8.  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  Followed by heart failure team.  Monitor for any signs of fluid overload.  Toprol  XL 12.5 mg daily.  Monitor for any hypotension Vitals:   12/14/23 0442 12/14/23 0743  BP: (!) 92/59 94/62  Pulse: 64 62  Resp: 18   Temp: 98.2 F (36.8 C)   SpO2: 96%     BPs on low side HR in 60s will stop metoprolol  and monitor BP and HR Filed Weights   12/12/23 0500 12/13/23 0458 12/14/23 0500  Weight: 74.9 kg 76 kg 76 kg   5/6-7 stable weight  9.  CAD/MI/CABG.  Follow-up cardiology services.  Currently maintained on aspirin  and Plavix  10.  VT.  Last episode noted 2023 with successful ATP. 11.  CKD stage IIIA=   Baseline creatinine 1.3-1.5.  Follow-up chemistries, 4/30 creat creeping up associated with low intake and good output , enc po fluids, avoid IV if possible , no diuretics to adjust  12.  HIV.  Continue Dovato - followed at the [facility] 13.  Hyperlipidemia.  Lipitor/Zetia  14.  GERD.  Pepcid  15.  Constipation.  MiraLAX  daily, Senokot changed to 2 po BID    -  last 2 BM 5/2 and 5/5 16.  Dysphagia.  Patient notes coughing on current diet of mechanical soft.  Dysphagia 2 thin liquids and have speech therapy follow-up.   MBS and FEES looked ok  17. Daily HA's since stroke- tylenol  is working-   -headaches seem fairly mild       LOS: 8 days A FACE TO FACE EVALUATION WAS PERFORMED  Richard [name] 12/14/2023, 8:17 AM

## 2023-12-14 NOTE — Progress Notes (Signed)
 Speech Language Pathology Weekly Progress and Session Note  Patient Details  Name: Richard [name] MRN: [id number] Date of Birth: 12-01-1955  Beginning of progress report period: December 07, 2023 End of progress report period: Dec 14, 2023  Today's Date: 12/14/2023 SLP Individual Time: 1310-1400 SLP Individual Time Calculation (min): 50 min  Short Term Goals: Week 1: SLP Short Term Goal 1 (Week 1): Patient will utilize compensatory strategies during consumption of least restrictive diet with min multimodal A SLP Short Term Goal 1 - Progress (Week 1): Met SLP Short Term Goal 2 (Week 1): Patient will increase speech intelligibility to 90% with use of compensatory strategies given min multimodal A SLP Short Term Goal 2 - Progress (Week 1): Met SLP Short Term Goal 3 (Week 1): Patient will demonstrate problem solving skills during mildly complex functional situations given min multimodal A SLP Short Term Goal 3 - Progress (Week 1): Not met SLP Short Term Goal 4 (Week 1): Patient will recall and utilize memory compensatory strategies given min multimodal A SLP Short Term Goal 4 - Progress (Week 1): Met SLP Short Term Goal 5 (Week 1): Patient will sustain attention to functional tasks for upwards of 15 minutes given min multimodal A SLP Short Term Goal 5 - Progress (Week 1): Met SLP Short Term Goal 6 (Week 1): Patient will demonstrate emergent awareness of mistakes given min multimodal A SLP Short Term Goal 6 - Progress (Week 1): Not met    New Short Term Goals: Week 2: SLP Short Term Goal 1 (Week 2): Patient will utilize compensatory strategies during consumption of least restrictive diet with supervision multimodal A SLP Short Term Goal 2 (Week 2): Patient will increase speech intelligibility to 90% with use of compensatory strategies given supervision multimodal A SLP Short Term Goal 3 (Week 2): Patient will demonstrate problem solving skills during mildly complex functional situations given  min multimodal A SLP Short Term Goal 4 (Week 2): Patient will recall and utilize memory compensatory strategies given supervision multimodal A SLP Short Term Goal 5 (Week 2): Patient will sustain attention to functional tasks for upwards of 15 minutes given supervision multimodal A SLP Short Term Goal 6 (Week 2): Patient will demonstrate emergent awareness of mistakes given min multimodal A  Weekly Progress Updates: Pt has made good gains and has met 4 of 6 STGs this reporting period due to improved speech intelligibility, cognition and dysphagia. Currently, patient continues to require min A for use of safe swallowing strategies to ensure clearance of oral cavity with D2/thin diet. Patient continues to require occasional minA to increase speech intelligibility to 90% at the conversational level. Patient now requires minA for daily recall of events and attention to task, however continues to require modA for emergent awareness and mildly complex problem solving.  Pt/family eduction ongoing. Pt would benefit from continued ST intervention to maximize cognition, speech intelligibility and swallowing safety in order to maximize functional independence at d/c.    Intensity: Minumum of 1-2 x/day, 30 to 90 minutes Frequency: 3 to 5 out of 7 days Duration/Length of Stay: 5/29 Treatment/Interventions: Cognitive remediation/compensation;Cueing hierarchy;Dysphagia/aspiration precaution training;Functional tasks;Internal/external aids;Patient/family education;Speech/Language facilitation;Therapeutic Activities   Daily Session  Skilled Therapeutic Interventions:   Initial 10 minutes of session missed due to patient having a bowel movement and requiring pericare from nursing. Once patient was back in the bed, SLP session targeted dysphagia and cognitive goals. SLP offered PO of D2 solids and thin liquids. Patient with mildly prolonged mastication times and complete oral clearance  given min cues to complete  lingual sweep. Intermittent s/sx of aspiration observed (throat clears/coughing), though present throughout session. Patient reporting feeling of drainage in throat. Recommend continuation of current diet. SLP targeted cognitive goals through functional math task. Patient required modA to count coins according to verbalized amount. Patient with occasional errors requiring modA to correct. Patient attended to L side and sustained attention to task for >15 minutes given minA. In additional minutes of the session, patient completed 5 sets of 5 repetitions of EMST set at 20cm H2O. Patient left in bed with alarm set and call bell in reach. Continue POC   Pain None reported   Therapy/Group: Individual Therapy  Antony Sian M.A., CCC-SLP 12/14/2023, 3:38 PM

## 2023-12-14 NOTE — Progress Notes (Incomplete)
 Physical Therapy Session Note  Patient Details  Name: Richard [name] MRN: [id number] Date of Birth: 1955-11-27  {CHL IP REHAB PT TIME CALCULATION:[id number]}  Short Term Goals: Week 1:  PT Short Term Goal 1 (Week 1): Pt will perform bed mobility with ModA +2. PT Short Term Goal 2 (Week 1): Pt will maintain seated balance with decreased push of RUE and increased trunk activation to maintain upright position with overall ModA for up to . PT Short Term Goal 3 (Week 1): Pt will perform sit<>stand using STEDY with MinA. PT Short Term Goal 4 (Week 1): Pt will initiate gait training  Skilled Therapeutic Interventions/Progress Updates:      Pt seated in TIS WC upon arrival. Pt agreeable to therapy. Pt denies any pain.   Pt transported dependent in TIS to main gym. Pt performed stand pivot transfer with +1 mod A WC<>mat table with verbal and tactile cues provided for anterior weight shift, and L UE/LLE positioning  Pt performed the following activities while seated EOM for postural control, dynamic sitting balance, midline orienataion, L inattention: therapist providing approximation of L UE on L LE and assisiting with L UE/LE positioning. Pt required intermittent mod A 2/2 lateral LOB to L  Cross body reaching with R UE to tap therapist hand (in L visual field)  Forward reaching with R UE to tap tech hand   Pt stood from mat table with therapist positioned in front of pt and R UE support and performed lateral weight shift x10 with mod-max A with therapist guarding L LE to prevent buckling.   Pt seated in TIS WC at end of session with all needs within reach and seatbelt alarm on.  Therapy Documentation Precautions:  Precautions Precautions: Fall, Other (comment) Precaution/Restrictions Comments: L hemiplegia with flaccidity, R pusher (can correct to pull with vc), dysarthria Splint/Cast - Date Prophylactic Dressing Applied (if applicable):  (PRafo ordered) Restrictions Weight Bearing  Restrictions Per Provider Order: No  Therapy/Group: Individual Therapy  [facility] [name], [place], DPT  12/14/2023, 7:56 AM

## 2023-12-15 DIAGNOSIS — I255 Ischemic cardiomyopathy: Secondary | ICD-10-CM | POA: Diagnosis not present

## 2023-12-15 DIAGNOSIS — B20 Human immunodeficiency virus [HIV] disease: Secondary | ICD-10-CM | POA: Diagnosis not present

## 2023-12-15 DIAGNOSIS — I69391 Dysphagia following cerebral infarction: Secondary | ICD-10-CM | POA: Diagnosis not present

## 2023-12-15 DIAGNOSIS — I63511 Cerebral infarction due to unspecified occlusion or stenosis of right middle cerebral artery: Secondary | ICD-10-CM | POA: Diagnosis not present

## 2023-12-15 NOTE — Progress Notes (Signed)
 Speech Language Pathology Daily Session Note  Patient Details  Name: Richard [name] MRN: [id number] Date of Birth: 15-Feb-1956  Today's Date: 12/15/2023 SLP Individual Time: 0800-0857 SLP Individual Time Calculation (min): 57 min  Short Term Goals: Week 2: SLP Short Term Goal 1 (Week 2): Patient will utilize compensatory strategies during consumption of least restrictive diet with supervision multimodal A SLP Short Term Goal 2 (Week 2): Patient will increase speech intelligibility to 90% with use of compensatory strategies given supervision multimodal A SLP Short Term Goal 3 (Week 2): Patient will demonstrate problem solving skills during mildly complex functional situations given min multimodal A SLP Short Term Goal 4 (Week 2): Patient will recall and utilize memory compensatory strategies given supervision multimodal A SLP Short Term Goal 5 (Week 2): Patient will sustain attention to functional tasks for upwards of 15 minutes given supervision multimodal A SLP Short Term Goal 6 (Week 2): Patient will demonstrate emergent awareness of mistakes given min multimodal A  Skilled Therapeutic Interventions: Skilled therapy session focused on cognitive goals. SLP facilitated completion of medication management task to encourage use of problem solving, memory and organizational skills. SLP  provided modA for patient to complete BID pillbox according to written and verbalized directions. Patient with several mistakes, though able to self correct with min-mod cues. SLP educate patient on continuing to utilize pillbox upon d/c to aid in planning, organization, and memory. Patient verbalized understanding. SLP continued to challenge patient through identification of medication mistakes task. Patient required mod fading to minA to identify errors in BID pillbox and correct according to directions. Patient left in bed with alarm set and call bell in reach. Continue POC.   Pain Denies  Therapy/Group:  Individual Therapy  Nicholaos Schippers M.A., CCC-SLP 12/15/2023, 7:33 AM

## 2023-12-15 NOTE — Progress Notes (Signed)
 Physical Therapy Weekly Progress Note  Patient Details  Name: Richard [name] MRN: [id number] Date of Birth: 26-Feb-1956  Beginning of progress report period: December 07, 2023 End of progress report period: Dec 15, 2023  Today's Date: 12/15/2023   Patient has met 4 of 4 short term goals.  Pt is slowly making appropriate progress towards goals and is on track to meet LTG. He has not missed much therapy for pain or fatigue. He currently completes bed mobility with Mod A +2, sit<>stand transfers with MinA to stable surface hold or heavy ModA otherwise, and stand<>pivot transfers with MaxA using 3 musketeer support. He's ambulating up to 50 ft with 3-musketeer technique using MaxA +2. He ha s not yet progressed to stair training or w/c mobility training. He is primarily limited by decreased dynamic standing/ seated balance, LUE sensory deficits, increase in tone, RUE and RLE weakness, and gait impairments. Caregiver/ family have not yet participated in family education to prepare for d/c home.   Patient continues to demonstrate the following deficits muscle weakness, decreased cardiorespiratoy endurance, impaired timing and sequencing, unbalanced muscle activation, and decreased coordination, decreased midline orientation, decreased attention to left, and decreased motor planning, decreased attention, decreased awareness, decreased problem solving, decreased safety awareness, decreased memory, and delayed processing, and decreased sitting balance, decreased standing balance, decreased postural control, decreased balance strategies, and hemipareisis and therefore will continue to benefit from skilled PT intervention to increase functional independence with mobility.  Patient progressing toward long term goals..  Continue plan of care.  PT Short Term Goals Week 1:  PT Short Term Goal 1 (Week 1): Pt will perform bed mobility with ModA +2. PT Short Term Goal 1 - Progress (Week 1): Met PT Short Term Goal 2  (Week 1): Pt will maintain seated balance with decreased push of RUE and increased trunk activation to maintain upright position with overall ModA for up to . PT Short Term Goal 2 - Progress (Week 1): Met PT Short Term Goal 3 (Week 1): Pt will perform sit<>stand using STEDY with MinA. PT Short Term Goal 3 - Progress (Week 1): Met PT Short Term Goal 4 (Week 1): Pt will initiate gait training PT Short Term Goal 4 - Progress (Week 1): Met Week 2:  PT Short Term Goal 1 (Week 2): Pt will perform bed mobility with MinA. PT Short Term Goal 2 (Week 2): Pt will perform squat pivot transfers with MinA. PT Short Term Goal 3 (Week 2): Pt will perform sit<>stand transfers with ModA. PT Short Term Goal 4 (Week 2): Pt will ambulate at least 30 ft using LRAD with ModA +2. PT Short Term Goal 5 (Week 2): Pt will initiate w/c mobility training.  Skilled Therapeutic Interventions/Progress Updates:  Ambulation/gait training;Cognitive remediation/compensation;Discharge planning;DME/adaptive equipment instruction;Functional mobility training;Pain management;Psychosocial support;Splinting/orthotics;Therapeutic Activities;UE/LE Strength taining/ROM;Visual/perceptual remediation/compensation;Wheelchair propulsion/positioning;UE/LE Coordination activities;Therapeutic Exercise;Stair training;Skin care/wound management;Neuromuscular re-education;Patient/family education;Functional electrical stimulation;Disease management/prevention;Community reintegration;Balance/vestibular training   Therapy Documentation Precautions:  Precautions Precautions: Fall, Other (comment) Precaution/Restrictions Comments: L hemiplegia with flaccidity, R pusher (can correct to pull with vc), dysarthria Splint/Cast - Date Prophylactic Dressing Applied (if applicable):  (PRafo ordered) Restrictions Weight Bearing Restrictions Per Provider Order: No  Pain: Pain Assessment Pain Scale: 0-10 Pain Score: 0-No pain related this session.     Therapy/Group: Individual Therapy  Donne Gage PT, DPT, CSRS 12/15/2023, 11:45 PM

## 2023-12-15 NOTE — Progress Notes (Signed)
 PROGRESS NOTE   Subjective/Complaints:  No issues overnite except nauseated this am  Discussed d/c date ROS: Limited due to cognitive/behavioral    Objective:   No results found.  No results for input(s): "WBC", "HGB", "HCT", "PLT" in the last 72 hours.  Recent Labs    12/13/23 0508  CREATININE 1.55*     Intake/Output Summary (Last 24 hours) at 12/15/2023 0918 Last data filed at 12/15/2023 0806 Gross per 24 hour  Intake 900 ml  Output 450 ml  Net 450 ml        Physical Exam: Vital Signs Blood pressure 103/72, pulse 65, temperature (!) 97.4 F (36.3 C), resp. rate 18, height 5\' 10"  (1.778 m), weight 75.2 kg, SpO2 95%.   General: No acute distress Mood and affect are appropriate Heart: Regular rate and rhythm no rubs murmurs or extra sounds Lungs: Clear to auscultation, breathing unlabored, no rales or wheezes Abdomen: Positive bowel sounds, soft nontender to palpation, nondistended Extremities: No clubbing, cyanosis, or edema Skin: No evidence of breakdown, no evidence of rash Neurologic: slow to arouse but awakens and cooperates with exam. Answers questions. Cranial nerves II through XII intact, motor strength is 5/5 in right and 0/5 left  deltoid, bicep, tricep, trace grip, hip flexor, knee extensors, ankle dorsiflexor and plantar flexor.  Clonus at the left ankle  Sensory exam normal sensation to light touch right side and LLE absent LT LUE but feels pinch  Cerebellar exam unable to perform on left due to weakness  Musculoskeletal: Full range of motion in all 4 extremities. No joint swelling  Assessment/Plan: 1. Functional deficits which require 3+ hours per day of interdisciplinary therapy in a comprehensive inpatient rehab setting. Physiatrist is providing close team supervision and 24 hour management of active medical problems listed below. Physiatrist and rehab team continue to assess barriers to  discharge/monitor patient progress toward functional and medical goals  Care Tool:  Bathing    Body parts bathed by patient: Left arm, Chest, Abdomen, Front perineal area, Right upper leg, Left upper leg, Face   Body parts bathed by helper: Right arm, Buttocks, Left lower leg, Right lower leg, Left arm     Bathing assist Assist Level: Maximal Assistance - Patient 24 - 49%     Upper Body Dressing/Undressing Upper body dressing   What is the patient wearing?: Pull over shirt    Upper body assist Assist Level: Moderate Assistance - Patient 50 - 74%    Lower Body Dressing/Undressing Lower body dressing      What is the patient wearing?: Pants, Underwear/pull up     Lower body assist Assist for lower body dressing: Total Assistance - Patient < 25%     Toileting Toileting    Toileting assist Assist for toileting: 2 Helpers     Transfers Chair/bed transfer  Transfers assist  Chair/bed transfer activity did not occur: Safety/medical concerns  Chair/bed transfer assist level: 2 Helpers (STEDY)     Locomotion Ambulation   Ambulation assist   Ambulation activity did not occur: Safety/medical concerns          Walk 10 feet activity   Assist  Walk 10 feet activity did not  occur: Safety/medical concerns        Walk 50 feet activity   Assist Walk 50 feet with 2 turns activity did not occur: Safety/medical concerns         Walk 150 feet activity   Assist Walk 150 feet activity did not occur: Safety/medical concerns         Walk 10 feet on uneven surface  activity   Assist Walk 10 feet on uneven surfaces activity did not occur: Safety/medical concerns         Wheelchair     Assist Is the patient using a wheelchair?: Yes Type of Wheelchair: Manual    Wheelchair assist level: Dependent - Patient 0%      Wheelchair 50 feet with 2 turns activity    Assist        Assist Level: Dependent - Patient 0%   Wheelchair 150 feet  activity     Assist      Assist Level: Dependent - Patient 0%   Blood pressure 103/72, pulse 65, temperature (!) 97.4 F (36.3 C), resp. rate 18, height 5\' 10"  (1.778 m), weight 75.2 kg, SpO2 95%.  Medical Problem List and Plan: 1. 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             -patient may  shower             -ELOS/Goals: 5/29 days Min A   -Continue CIR therapies including PT, OT, and SLP  Severe CHF, changed to 15/7   PRAFO and WHO for LLE and LUE 2.  Antithrombotics: -DVT/anticoagulation: Eliquis              -antiplatelet therapy: ASA 81mg  daily 3. Pain Management/spasticity: Tylenol  as needed Post CVA HA Bilateral knee pain , aching in am s- OA noted on Xrays in 2023 start diclofenac gel   -emerging left sided tone.    -5/4 unable to tolerate even low dose baclofen--> stopped    -outpt botox candidate 4. Mood/Behavior/Sleep: Provide emotional support             -antipsychotic agents: N/A 5. Neuropsych/cognition: This patient is capable of making decisions on his own behalf. 6. Skin/Wound Care: Routine skin checks 7. Fluids/Electrolytes/Nutrition: Routine in and outs with follow-up chemistries Intake of fluids improved to yesterday  8.  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  Followed by heart failure team.  Monitor for any signs of fluid overload.  Toprol  XL 12.5 mg daily.  Monitor for any hypotension Vitals:   12/14/23 2006 12/15/23 0510  BP: 101/76 103/72  Pulse: 75 65  Resp: 18 18  Temp: 98.3 F (36.8 C) (!) 97.4 F (36.3 C)  SpO2: 100% 95%    BPs on low side HR in 60s will stop metoprolol  and monitor BP and HR Filed Weights   12/13/23 0458 12/14/23 0500 12/15/23 0510  Weight: 76 kg 76 kg 75.2 kg   5/6-7 stable weight  9.  CAD/MI/CABG.  Follow-up cardiology services.  Currently  maintained on aspirin  and Plavix  10.  VT.  Last episode noted 2023 with successful ATP. 11.  CKD stage IIIA=  Baseline creatinine 1.3-1.5.  Follow-up chemistries,stable  Creat    Latest Ref Rng & Units 12/13/2023    5:08 AM 12/08/2023    5:49 AM 12/07/2023    5:05 AM  BMP  Glucose 70 - 99 mg/dL  98   BUN 8 - 23 mg/dL   22   Creatinine 7.84 - 1.24 mg/dL 6.96  2.95  2.84   Sodium 135 - 145 mmol/L   136   Potassium 3.5 - 5.1 mmol/L   4.4   Chloride 98 - 111 mmol/L   103   CO2 22 - 32 mmol/L   23   Calcium  8.9 - 10.3 mg/dL   9.4     12.  HIV.  Continue Dovato - followed at the [facility] 13.  Hyperlipidemia.  Lipitor/Zetia  14.  GERD.  Pepcid  15.  Constipation.  MiraLAX  daily, Senokot changed to 2 po BID    -last 2 BM 5/2 and 5/5 16.  Dysphagia.  Patient notes coughing on current diet of mechanical soft.  Dysphagia 2 thin liquids and have speech therapy follow-up.   MBS and FEES looked ok  17. Daily HA's since stroke- tylenol  is working-   -headaches seem fairly mild   18.  Nausea today , no new meds, had BM yesterday , no diarrhea, abd exam ok, ate 40%  breakfast, enc fluids , will monitor     LOS: 9 days A FACE TO FACE EVALUATION WAS PERFORMED  Richard [name] 12/15/2023, 9:18 AM

## 2023-12-15 NOTE — Progress Notes (Signed)
 Occupational Therapy Session Note  Patient Details  Name: Richard [name] MRN: [id number] Date of Birth: [birth year]  Today's Date: 12/15/2023 OT Individual Time: 4098-1191 OT Individual Time Calculation (min): 43 min    Short Term Goals: Week 2:  OT Short Term Goal 1 (Week 2): Pt will complete toilet transfers with max A x1 OT Short Term Goal 2 (Week 2): Pt will maintain L UE in safe position when sitting up in wc with min verbal cues OT Short Term Goal 3 (Week 2): Pt will complete squat pivot transfers with max A x1 OT Short Term Goal 4 (Week 2): Pt will maintain sitting balance in preparation for functional transfers with CGA consistently  Skilled Therapeutic Interventions/Progress Updates:     Pt received semi-reclined in bed, alert and presenting to be in good spirits receptive to skilled OT session reporting 3/10 pain in L ankle- OT offering intermittent rest breaks, repositioning, and therapeutic support to optimize participation in therapy session. RN in/out to provide medications. Focused this session on transitional movement patterns and BADL retraining. Pt's brief noted to be soiled with Pt receptive to getting cleaned up. Worked on rolling in bed while OT assisted with per--care and donning clean brief. Pt able to roll to L using bed rail supervision and to the R max A d/t hemiplegia. Facilitated WB'ing through L LE and crossing midline with L UE during rolling in bed. Assistance required to weave L LE when donning pants bed level with Pt then able to weave R LE min A and bridge hips to bring pants to waist with OT placing and stabilizing L LE. Supine > EOB using posterior hooking method and log rolling technique mod A to bring B LEs off EOB and fully lift trunk. Worked on sitting balance, midline orientation, crossing midline, and trunk control sitting EOB. With R UE supported on bed rail and verbal cues for head/trunk positioning, Pt able to maintain static sitting balance with  sup/CGA. Integrated R UE into functional reaching to L superiorly/inferiorly crossing midline with increased balance challenge noted, however Pt able to correct midline shift with min/mod increasing to max A when fatigued. Facilitated WB'ing into L UE during sitting balance activities for improved proprioceptive feedback. Squat pivot to R mod Ax1 +max verbal and tactile cues for technique-Pt with improved recall of need to bring his chin to OT's shoulder and head/hip relationship. Positioned Pt at sink for grooming/hygiene tasks with pt able to recall hemi techniques for oral care complete task with supervision +increased time. Pt was left resting in TISWC with call bell in reach, seatbelt alarm on, and all needs met.    Therapy Documentation Precautions:  Precautions Precautions: Fall, Other (comment) Precaution/Restrictions Comments: L hemiplegia with flaccidity, R pusher (can correct to pull with vc), dysarthria Splint/Cast - Date Prophylactic Dressing Applied (if applicable):  (PRafo ordered) Restrictions Weight Bearing Restrictions Per Provider Order: No   Therapy/Group: Individual Therapy  Geoffery Kiel 12/15/2023, 8:01 AM

## 2023-12-15 NOTE — Progress Notes (Signed)
 Physical Therapy Session Note  Patient Details  Name: Richard [name] MRN: [id number] Date of Birth: 18-Jul-1956  Today's Date: 12/15/2023 PT Individual Time: 1031-1105 PT Individual Time Calculation (min): 34 min   Short Term Goals: Week 1:  PT Short Term Goal 1 (Week 1): Pt will perform bed mobility with ModA +2. PT Short Term Goal 2 (Week 1): Pt will maintain seated balance with decreased push of RUE and increased trunk activation to maintain upright position with overall ModA for up to . PT Short Term Goal 3 (Week 1): Pt will perform sit<>stand using STEDY with MinA. PT Short Term Goal 4 (Week 1): Pt will initiate gait training  Skilled Therapeutic Interventions/Progress Updates:  Patient seated upright in TIS w/c on entrance to room. Patient alert and agreeable to PT session.   Patient with no pain complaint at start of session.  E-stim/ NMR: Applied 4"x2" sized electrodes to LLE quadriceps muscle group for neuromuscular electrical stimulation (NMES). NMR interventions below using Empi e-stim device set on NMES for large muscle groups. Intensity set to 20mA with visual/ palpable muscle contraction and then increased to 21mA at mark. Ramp 2sec, on for 10sec and off for 2sec. Pt guided in muscular contraction during activation (on-time) while seated in  LAQs as well as attempt to hold L knee in extension while standing.   When standing, pt is able to perform several standing bouts for , , x2. Cued to hold L knee in extension while stimulation is on. Pt is not able to hold upright stance despite requests for maintaining standing position for . Continuously making move to reach back for chair or relating need to sit.   Noted in standing that pt continues to require at least guard and up to block of L knee despite ability to perform near full AROM LAQ with activation while seated. Intermittent, brief ability to hold knee in extension, otherwise does require vc/ tc  and up to Mod/ MaxA to block L knee from buckle.   After completion of e-stim, pt denies any negative side effects with skin intact and no adverse side effects noted.   Pt then prepped and Sa[place]o estim applied to LLE anterior tibialis muscle in order to promote ankle DF. Unit applied to ant tib and increased until active DF noted following visual and palpable muscle contraction. Pt does not complain of pain and relates not feeling as much as he does with Empi estim unit. Left to run for 1hr and returned when off to remove. No adverse side effects noted.  NMR performed for improvements in motor control and coordination, balance, sequencing, judgement, and self confidence/ efficacy in performing all aspects of mobility at highest level of independence.   Patient seated upright in TIS w/c at end of session with brakes locked, belt alarm set, and all needs within reach.   Therapy Documentation Precautions:  Precautions Precautions: Fall, Other (comment) Precaution/Restrictions Comments: L hemiplegia with flaccidity, R pusher (can correct to pull with vc), dysarthria Splint/Cast - Date Prophylactic Dressing Applied (if applicable):  (PRafo ordered) Restrictions Weight Bearing Restrictions Per Provider Order: No  Pain: No pain relates this session.   Therapy/Group: Individual Therapy  Donne Gage PT, DPT, CSRS 12/15/2023, 1:21 PM

## 2023-12-15 NOTE — Progress Notes (Signed)
 Physical Therapy Session Note  Patient Details  Name: Richard [name] MRN: [id number] Date of Birth: 1956-03-10  Today's Date: 12/15/2023 PT Individual Time: 1306-1401 PT Individual Time Calculation (min): 55 min   Short Term Goals: Week 1:  PT Short Term Goal 1 (Week 1): Pt will perform bed mobility with ModA +2. PT Short Term Goal 2 (Week 1): Pt will maintain seated balance with decreased push of RUE and increased trunk activation to maintain upright position with overall ModA for up to . PT Short Term Goal 3 (Week 1): Pt will perform sit<>stand using STEDY with MinA. PT Short Term Goal 4 (Week 1): Pt will initiate gait training  Skilled Therapeutic Interventions/Progress Updates: Patient sitting in TIS on entrance to room. Patient alert and agreeable to PT session.   Patient with no complaints of pain. Pt required transfer to toilet to have BM and void bladder (charted).  Therapeutic Activity: - Pt transported to toilet via STEDY with minA to sit<>stand (totalA for posterior peri-care while pt standing in STEDY with R UE supported, and cues for pt to maintain forward gaze and slight increase in upright posture. Pt totalA to doff/donn lower body clothing for time management. Pt transported from room<>main gym in TIS dependently.   Gait Training:  Pt ambulated 50' x 1 using + 2 (3 musketeer) with PTA on L facilitating L LE advancement through gait cycle (tech on R assisting with upright posture) with overall maxA during swing phase (pt with slight advancement with mixture of hip compensation and very little hip flexor activation) and stance phase to prevent buckling (some notable quad activation, but still unable to control extension activation). Pt demonstrated the following gait deviations with therapist providing the described cuing and facilitation for improvement:   Neuromuscular Re-ed: NMR facilitated during session with focus on WB B LE (L>R) for proprioceptive feedback and  quadriceps activation. - Standing at table elevated with R UE support and PTA providing maxA to maintain L knee in extension. + 2 on R providing CGA for safety. Pt cued to maintain upright posture with glute activation (Decreased on L) with maxA required to maintain. Pt required seated rest break, then performed mini squats with maxA for pt to flex/extend L knee at first with very minimal to none ability to control, but progressed to slight increase in activation of quadriceps to extend from slight bent position with modA.   NMR performed for improvements in motor control and coordination, balance, sequencing, judgement, and self confidence/ efficacy in performing all aspects of mobility at highest level of independence.   Patient sitting in TIS at end of session with brakes locked, wife present, belt alarm set, and all needs within reach.      Therapy Documentation Precautions:  Precautions Precautions: Fall, Other (comment) Precaution/Restrictions Comments: L hemiplegia with flaccidity, R pusher (can correct to pull with vc), dysarthria Splint/Cast - Date Prophylactic Dressing Applied (if applicable):  (PRafo ordered) Restrictions Weight Bearing Restrictions Per Provider Order: No  Therapy/Group: Individual Therapy  Talesha Ellithorpe PTA 12/15/2023, 3:15 PM

## 2023-12-16 DIAGNOSIS — I255 Ischemic cardiomyopathy: Secondary | ICD-10-CM | POA: Diagnosis not present

## 2023-12-16 DIAGNOSIS — I63511 Cerebral infarction due to unspecified occlusion or stenosis of right middle cerebral artery: Secondary | ICD-10-CM | POA: Diagnosis not present

## 2023-12-16 DIAGNOSIS — B20 Human immunodeficiency virus [HIV] disease: Secondary | ICD-10-CM | POA: Diagnosis not present

## 2023-12-16 DIAGNOSIS — I69391 Dysphagia following cerebral infarction: Secondary | ICD-10-CM | POA: Diagnosis not present

## 2023-12-16 NOTE — Progress Notes (Signed)
 Occupational Therapy Session Note  Patient Details  Name: Richard [name] MRN: [id number] Date of Birth: 07-10-1956  Today's Date: 12/16/2023 OT Individual Time: 1102-1203 OT Individual Time Calculation (min): 61 min    Short Term Goals: Week 2:  OT Short Term Goal 1 (Week 2): Pt will complete toilet transfers with max A x1 OT Short Term Goal 2 (Week 2): Pt will maintain L UE in safe position when sitting up in wc with min verbal cues OT Short Term Goal 3 (Week 2): Pt will complete squat pivot transfers with max A x1 OT Short Term Goal 4 (Week 2): Pt will maintain sitting balance in preparation for functional transfers with CGA consistently  Skilled Therapeutic Interventions/Progress Updates:     Pt received reclined in bed, noted to be soiled of urine. Pt presenting to be in good spirits receptive to skilled OT session reporting 0/10 pain- OT offering intermittent rest breaks, repositioning, and therapeutic support to optimize participation in therapy session. Pt receptive to getting cleaned up during session requesting to use restroom and take shower. Focused this session on BADL retraining with emphasis on increasing Pt's safety and independence.   Mobility:  -Bed mobility: Pt able to utilize posterior hooking method to bring B LEs off EOB with mod A required to fully bring L LE off EOB d/t spasticity. Pt able to lift trunk with CGA with HOB partially elevated. Pt with improved sitting balance this session, maintaining standing balance with CGA in preparation for transfer with R UE support.  -Utilized STEDY for functional transfers to elevated toilet seat, TTB, and TISWC to facilitate increased opportunities to work on sit<>stands, facilitate WB'ing through L LE, and work on midline orientation. Pt able to complete sit<>stands with R UE supported on STEDY grab bar with mod A overall +consistent verbal cues for midline orientation- Pt with improved ability to correct L lateral shift this  session when provided with external visual cue. Pt maintained standing balance with min-mod A overall +max multimodal cues during BADLs.   ADLs:  -Toileting completed total A seated on elevated toilet seat- continent void and BM documented in flowsheets.  -U/LB bathing completed sitting on TTB. Worked on dynamic sitting balance during bathing task with Pt able to maintain balance with close supervision with R UE supported and CGA-min A when incorporating R UE into functional tasks. Pt able to maintain attention, sequence, and problem solve during bathing tasks with min verbal cues.  -U/LB dressing completed sitting on TTB. Pt able to recall hemi-dressing techniques with min questioning cues. Pt donned clean shirt with min A required for positioning shirt and weaving L UE. A provided for weaving L LE with Pt then able to weave R LE- pt stood in steady and maintained standing balance mod A while OT brought pants to waist.   Pt was left resting in TISWC with call bell in reach, seatbelt alarm on, and all needs met.     Therapy Documentation Precautions:  Precautions Precautions: Fall, Other (comment) Precaution/Restrictions Comments: L hemiplegia with flaccidity, R pusher (can correct to pull with vc), dysarthria Splint/Cast - Date Prophylactic Dressing Applied (if applicable):  (PRafo ordered) Restrictions Weight Bearing Restrictions Per Provider Order: No   Therapy/Group: Individual Therapy  Geoffery Kiel 12/16/2023, 7:59 AM

## 2023-12-16 NOTE — Progress Notes (Signed)
 Physical Therapy Session Note  Patient Details  Name: Richard [name] MRN: [id number] Date of Birth: Nov 04, 1955  Today's Date: 12/16/2023 PT Individual Time: 4098-1191 PT Individual Time Calculation (min): 71 min   Short Term Goals: Week 1:  PT Short Term Goal 1 (Week 1): Pt will perform bed mobility with ModA +2. PT Short Term Goal 2 (Week 1): Pt will maintain seated balance with decreased push of RUE and increased trunk activation to maintain upright position with overall ModA for up to . PT Short Term Goal 3 (Week 1): Pt will perform sit<>stand using STEDY with MinA. PT Short Term Goal 4 (Week 1): Pt will initiate gait training Week 2:     Skilled Therapeutic Interventions/Progress Updates:      Therapy Documentation Precautions:  Precautions Precautions: Fall, Other (comment) Precaution/Restrictions Comments: L hemiplegia with flaccidity, R pusher (can correct to pull with vc), dysarthria Splint/Cast - Date Prophylactic Dressing Applied (if applicable):  (PRafo ordered) Restrictions Weight Bearing Restrictions Per Provider Order: No General:   Vital Signs: Therapy Vitals Temp: 98.4 F (36.9 C) Pulse Rate: 74 Resp: 18 BP: 111/71 Patient Position (if appropriate): Lying Oxygen Therapy SpO2: 100 % O2 Device: Room Air Pain: Pain Assessment Pain Scale: 0-10 Pain Score: 0-No pain Pain Location: Head Pain Intervention(s): Medication (See eMAR) Mobility:   Locomotion :    Trunk/Postural Assessment :    Balance:   Exercises:   Other Treatments:      Therapy/Group: Individual Therapy  Donne Gage PT, DPT, CSRS 12/16/2023, 11:46 PM

## 2023-12-16 NOTE — Progress Notes (Signed)
 Speech Language Pathology Daily Session Note  Patient Details  Name: Richard [name] MRN: [id number] Date of Birth: Apr 25, 1956  Today's Date: 12/16/2023 SLP Individual Time: 0900-0959 SLP Individual Time Calculation (min): 59 min  Short Term Goals: Week 2: SLP Short Term Goal 1 (Week 2): Patient will utilize compensatory strategies during consumption of least restrictive diet with supervision multimodal A SLP Short Term Goal 2 (Week 2): Patient will increase speech intelligibility to 90% with use of compensatory strategies given supervision multimodal A SLP Short Term Goal 3 (Week 2): Patient will demonstrate problem solving skills during mildly complex functional situations given min multimodal A SLP Short Term Goal 4 (Week 2): Patient will recall and utilize memory compensatory strategies given supervision multimodal A SLP Short Term Goal 5 (Week 2): Patient will sustain attention to functional tasks for upwards of 15 minutes given supervision multimodal A SLP Short Term Goal 6 (Week 2): Patient will demonstrate emergent awareness of mistakes given min multimodal A  Skilled Therapeutic Interventions: Skilled therapy session focused on dysphagia and communication goals. SLP facilitated session by observing patient with D3/thin liquids via cup. Patient with timely mastication and clearance of oral cavity given supervisionA. Patient continues to demonstrate with intermittent throat clears during all consistencies, though reports "tickle" and sinus drainage. Recommend continuation of current diet (D2/thin) with plan for D3 trial tray before upgrade. Continue full supervision. SLP targeted communication goals through use of EMST set at 25cm H2O. Patient completed 5 sets of 5 repetitions given supervisionA. Patient recalled 4/4 speech intelligibility strategies independently and utilized at the conversational level to reach 90% intelligibility. Patient left in bed with alarm set and call bell in  reach. Continue POC.   Pain Denies  Therapy/Group: Individual Therapy  Lundy Cozart M.A., CCC-SLP 12/16/2023, 7:38 AM

## 2023-12-16 NOTE — Progress Notes (Addendum)
 PROGRESS NOTE   Subjective/Complaints:  No issues overnite , HA this am , requesting tylenol   ROS: Limited due to cognitive/behavioral    Objective:   No results found.  No results for input(s): "WBC", "HGB", "HCT", "PLT" in the last 72 hours.  No results for input(s): "NA", "K", "CL", "CO2", "GLUCOSE", "BUN", "CREATININE", "CALCIUM " in the last 72 hours.    Intake/Output Summary (Last 24 hours) at 12/16/2023 0825 Last data filed at 12/16/2023 0750 Gross per 24 hour  Intake 680 ml  Output --  Net 680 ml        Physical Exam: Vital Signs Blood pressure 97/72, pulse 65, temperature 97.8 F (36.6 C), temperature source Oral, resp. rate 16, height 5\' 10"  (1.778 m), weight 76.4 kg, SpO2 98%.   General: No acute distress Mood and affect are appropriate Heart: Regular rate and rhythm no rubs murmurs or extra sounds Lungs: Clear to auscultation, breathing unlabored, no rales or wheezes Abdomen: Positive bowel sounds, soft nontender to palpation, nondistended Extremities: No clubbing, cyanosis, or edema Skin: No evidence of breakdown, no evidence of rash Neurologic: slow to arouse but awakens and cooperates with exam. Answers questions. Cranial nerves II through XII intact, motor strength is 5/5 in right and 0/5 left  deltoid, bicep, tricep, trace grip, hip flexor, knee extensors, ankle dorsiflexor and plantar flexor.  Clonus at the left ankle  Trace Left hip/knee ext synergy  Sensory exam normal sensation to light touch right side and LLE absent LT LUE but feels pinch  Cerebellar exam unable to perform on left due to weakness  Musculoskeletal: Full range of motion in all 4 extremities. No joint swelling  Assessment/Plan: 1. Functional deficits which require 3+ hours per day of interdisciplinary therapy in a comprehensive inpatient rehab setting. Physiatrist is providing close team supervision and 24 hour management of  active medical problems listed below. Physiatrist and rehab team continue to assess barriers to discharge/monitor patient progress toward functional and medical goals  Care Tool:  Bathing    Body parts bathed by patient: Left arm, Chest, Abdomen, Front perineal area, Right upper leg, Left upper leg, Face   Body parts bathed by helper: Right arm, Buttocks, Left lower leg, Right lower leg, Left arm     Bathing assist Assist Level: Maximal Assistance - Patient 24 - 49%     Upper Body Dressing/Undressing Upper body dressing   What is the patient wearing?: Pull over shirt    Upper body assist Assist Level: Moderate Assistance - Patient 50 - 74%    Lower Body Dressing/Undressing Lower body dressing      What is the patient wearing?: Pants, Underwear/pull up     Lower body assist Assist for lower body dressing: Total Assistance - Patient < 25%     Toileting Toileting    Toileting assist Assist for toileting: 2 Helpers     Transfers Chair/bed transfer  Transfers assist  Chair/bed transfer activity did not occur: Safety/medical concerns  Chair/bed transfer assist level: 2 Helpers (STEDY)     Locomotion Ambulation   Ambulation assist   Ambulation activity did not occur: Safety/medical concerns          Walk  10 feet activity   Assist  Walk 10 feet activity did not occur: Safety/medical concerns        Walk 50 feet activity   Assist Walk 50 feet with 2 turns activity did not occur: Safety/medical concerns         Walk 150 feet activity   Assist Walk 150 feet activity did not occur: Safety/medical concerns         Walk 10 feet on uneven surface  activity   Assist Walk 10 feet on uneven surfaces activity did not occur: Safety/medical concerns         Wheelchair     Assist Is the patient using a wheelchair?: Yes Type of Wheelchair: Manual    Wheelchair assist level: Dependent - Patient 0%      Wheelchair 50 feet with 2 turns  activity    Assist        Assist Level: Dependent - Patient 0%   Wheelchair 150 feet activity     Assist      Assist Level: Dependent - Patient 0%   Blood pressure 97/72, pulse 65, temperature 97.8 F (36.6 C), temperature source Oral, resp. rate 16, height 5\' 10"  (1.778 m), weight 76.4 kg, SpO2 98%.  Medical Problem List and Plan: 1. 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             -patient may  shower             -ELOS/Goals: 5/29 days Min A   -Continue CIR therapies including PT, OT, and SLP  Severe CHF, changed to 15/7   PRAFO and WHO for LLE and LUE 2.  Antithrombotics: -DVT/anticoagulation: Eliquis              -antiplatelet therapy: ASA 81mg  daily 3. Pain Management/spasticity: Tylenol  as needed Post CVA HA Bilateral knee pain , aching in am s- OA noted on Xrays in 2023 start diclofenac  gel   -emerging left sided tone.    -5/4 unable to tolerate even low dose baclofen --> stopped    -outpt botox candidate 4. Mood/Behavior/Sleep: Provide emotional support             -antipsychotic agents: N/A 5. Neuropsych/cognition: This patient is capable of making decisions on his own behalf. 6. Skin/Wound Care: Routine skin checks 7. Fluids/Electrolytes/Nutrition: Routine in and outs with follow-up chemistries Intake of fluids improved to yesterday  8.  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  Followed by heart failure team.  Monitor for any signs of fluid overload.  Toprol  XL 12.5 mg daily.  Monitor for any hypotension Vitals:   12/15/23 2200 12/16/23 0423  BP: 101/71 97/72  Pulse: 80 65  Resp: 18 16  Temp: 98.4 F (36.9 C) 97.8 F (36.6 C)  SpO2: 99% 98%    BPs on low side HR in 60s will stop metoprolol  and monitor BP and HR- BP and HR still on low side off metoprolol  5/9 Filed Weights    12/14/23 0500 12/15/23 0510 12/16/23 0426  Weight: 76 kg 75.2 kg 76.4 kg   5/6-7 stable weight  9.  CAD/MI/CABG.  Follow-up cardiology services.  Currently maintained on aspirin  and Plavix  10.  VT.  Last episode noted 2023 with successful ATP. 11.  CKD stage IIIA=  Baseline creatinine 1.3-1.5.  Follow-up chemistries,stable  Creat    Latest Ref Rng & Units 12/13/2023  5:08 AM 12/08/2023    5:49 AM 12/07/2023    5:05 AM  BMP  Glucose 70 - 99 mg/dL   98   BUN 8 - 23 mg/dL   22   Creatinine 1.61 - 1.24 mg/dL 0.96  0.45  4.09   Sodium 135 - 145 mmol/L   136   Potassium 3.5 - 5.1 mmol/L   4.4   Chloride 98 - 111 mmol/L   103   CO2 22 - 32 mmol/L   23   Calcium  8.9 - 10.3 mg/dL   9.4     12.  HIV.  Continue Dovato - followed at the [facility] 13.  Hyperlipidemia.  Lipitor/Zetia  14.  GERD.  Pepcid  15.  Constipation.  MiraLAX  daily, Senokot changed to 2 po BID    -last 2 BM 5/2 and 5/5 16.  Dysphagia.  Patient notes coughing on current diet of mechanical soft.  Dysphagia 2 thin liquids and have speech therapy follow-up.   MBS and FEES looked ok  17. Daily HA's since stroke- tylenol  is working-   -headaches seem fairly mild      LOS: 10 days A FACE TO FACE EVALUATION WAS PERFORMED  Genetta Kenning 12/16/2023, 8:25 AM

## 2023-12-17 NOTE — Progress Notes (Signed)
 PROGRESS NOTE   Subjective/Complaints:  Pt reports feeling better- slept better- denies HA this AM Is having a mild stomach ache- asking for something for this.  LBM yesterday.,    [id number] due to cognition/behavioral  Objective:   No results found.  No results for input(s): "WBC", "HGB", "HCT", "PLT" in the last 72 hours.  No results for input(s): "NA", "K", "CL", "CO2", "GLUCOSE", "BUN", "CREATININE", "CALCIUM " in the last 72 hours.    Intake/Output Summary (Last 24 hours) at 12/17/2023 0934 Last data filed at 12/17/2023 0757 Gross per 24 hour  Intake 438 ml  Output 375 ml  Net 63 ml        Physical Exam: Vital Signs Blood pressure 98/67, pulse 63, temperature (!) 97.4 F (36.3 C), resp. rate 18, height 5\' 10"  (1.778 m), weight 76.7 kg, SpO2 98%.    General: awake, alert, sleepy, just woke him up; wearing WHO on LUE;  NAD HENT: conjugate gaze; oropharynx dry CV: regular rate and rhythm; no JVD Pulmonary: CTA B/L; no W/R/R- good air movement GI: soft, NT, ND, (+)BS- hypoactive Psychiatric: very flat- delayed Neurological: sleepy- delayed responses- Skin: No evidence of breakdown, no evidence of rash Neurologic: slow to arouse but awakens and cooperates with exam. Answers questions. Cranial nerves II through XII intact, motor strength is 5/5 in right and 0/5 left  deltoid, bicep, tricep, trace grip, hip flexor, knee extensors, ankle dorsiflexor and plantar flexor.  Clonus at the left ankle  Trace Left hip/knee ext synergy  Sensory exam normal sensation to light touch right side and LLE absent LT LUE but feels pinch  Cerebellar exam unable to perform on left due to weakness  Musculoskeletal: Full range of motion in all 4 extremities. No joint swelling  Assessment/Plan: 1. Functional deficits which require 3+ hours per day of interdisciplinary therapy in a comprehensive inpatient rehab setting. Physiatrist  is providing close team supervision and 24 hour management of active medical problems listed below. Physiatrist and rehab team continue to assess barriers to discharge/monitor patient progress toward functional and medical goals  Care Tool:  Bathing    Body parts bathed by patient: Left arm, Chest, Abdomen, Front perineal area, Right upper leg, Left upper leg, Face   Body parts bathed by helper: Right arm, Buttocks, Left lower leg, Right lower leg, Left arm     Bathing assist Assist Level: Moderate Assistance - Patient 50 - 74%     Upper Body Dressing/Undressing Upper body dressing   What is the patient wearing?: Pull over shirt    Upper body assist Assist Level: Minimal Assistance - Patient > 75%    Lower Body Dressing/Undressing Lower body dressing      What is the patient wearing?: Pants, Underwear/pull up     Lower body assist Assist for lower body dressing: Maximal Assistance - Patient 25 - 49%     Toileting Toileting    Toileting assist Assist for toileting: Total Assistance - Patient < 25%     Transfers Chair/bed transfer  Transfers assist  Chair/bed transfer activity did not occur: Safety/medical concerns  Chair/bed transfer assist level: 2 Helpers (STEDY)     Locomotion Ambulation   Ambulation  assist   Ambulation activity did not occur: Safety/medical concerns          Walk 10 feet activity   Assist  Walk 10 feet activity did not occur: Safety/medical concerns        Walk 50 feet activity   Assist Walk 50 feet with 2 turns activity did not occur: Safety/medical concerns         Walk 150 feet activity   Assist Walk 150 feet activity did not occur: Safety/medical concerns         Walk 10 feet on uneven surface  activity   Assist Walk 10 feet on uneven surfaces activity did not occur: Safety/medical concerns         Wheelchair     Assist Is the patient using a wheelchair?: Yes Type of Wheelchair: Manual     Wheelchair assist level: Dependent - Patient 0%      Wheelchair 50 feet with 2 turns activity    Assist        Assist Level: Dependent - Patient 0%   Wheelchair 150 feet activity     Assist      Assist Level: Dependent - Patient 0%   Blood pressure 98/67, pulse 63, temperature (!) 97.4 F (36.3 C), resp. rate 18, height 5\' 10"  (1.778 m), weight 76.7 kg, SpO2 98%.  Medical Problem List and Plan: 1. 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             -patient may  shower             -ELOS/Goals: 5/29 days Min A  Con't CIR Severe CHF, changed to 15/7   PRAFO and WHO for LLE and LUE 2.  Antithrombotics: -DVT/anticoagulation: Eliquis              -antiplatelet therapy: ASA 81mg  daily 3. Pain Management/spasticity: Tylenol  as needed Post CVA HA Bilateral knee pain , aching in am s- OA noted on Xrays in 2023 start diclofenac  gel   -emerging left sided tone.    -5/4 unable to tolerate even low dose baclofen --> stopped    -outpt botox candidate 4. Mood/Behavior/Sleep: Provide emotional support             -antipsychotic agents: N/A 5. Neuropsych/cognition: This patient is ?capable of making decisions on his own behalf. 6. Skin/Wound Care: Routine skin checks 7. Fluids/Electrolytes/Nutrition: Routine in and outs with follow-up chemistries Intake of fluids improved to yesterday  8.  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  Followed by heart failure team.  Monitor for any signs of fluid overload.  Toprol  XL 12.5 mg daily.  Monitor for any hypotension Vitals:   12/17/23 0522 12/17/23 0600  BP: 98/67   Pulse: 63   Resp: 18   Temp: (!) 97.4 F (36.3 C)   SpO2: (!) 87% 98%    BPs on low side HR in 60s will stop metoprolol  and monitor BP and HR- BP and HR still on low side off metoprolol  5/9  5/10-  BP  in high 90s' systolic and HR running around 60- will monitor Filed Weights   12/15/23 0510 12/16/23 0426 12/17/23 0500  Weight: 75.2 kg 76.4 kg 76.7 kg   5/6-7 stable weight  9.  CAD/MI/CABG.  Follow-up cardiology services.  Currently maintained on aspirin  and Plavix  10.  VT.  Last episode noted 2023 with successful ATP. 11.  CKD stage IIIA=  Baseline creatinine 1.3-1.5.  Follow-up chemistries,stable  Creat    Latest Ref Rng & Units 12/13/2023    5:08 AM 12/08/2023    5:49 AM 12/07/2023    5:05 AM  BMP  Glucose 70 - 99 mg/dL   98   BUN 8 - 23 mg/dL   22   Creatinine 1.61 - 1.24 mg/dL 0.96  0.45  4.09   Sodium 135 - 145 mmol/L   136   Potassium 3.5 - 5.1 mmol/L   4.4   Chloride 98 - 111 mmol/L   103   CO2 22 - 32 mmol/L   23   Calcium  8.9 - 10.3 mg/dL   9.4     12.  HIV.  Continue Dovato - followed at the [facility] 13.  Hyperlipidemia.  Lipitor/Zetia  14.  GERD.  Pepcid  15.  Constipation.  MiraLAX  daily, Senokot changed to 2 po BID    -last 2 BM 5/2 and 5/5  5/10- LBM this AM 16.  Dysphagia.  Patient notes coughing on current diet of mechanical soft.  Dysphagia 2 thin liquids and have speech therapy follow-up.   MBS and FEES looked ok  17. Daily HA's since stroke- tylenol  is working-   -headaches seem fairly mild   5/10- denies HA this AM 18. Mild stomach ache  5/10- asked nursing to give Mylanta as needed that's ordered   I spent a total of  35  minutes on total care today- >50% coordination of care- due to  D/w nursing as well as pt about abd pain- review of labs; vitals and B/B-     LOS: 11 days A FACE TO FACE EVALUATION WAS PERFORMED  Bryer Gottsch 12/17/2023, 9:34 AM

## 2023-12-18 MED ORDER — TOPIRAMATE 25 MG PO TABS
50.0000 mg | ORAL_TABLET | Freq: Every day | ORAL | Status: DC
  Administered 2023-12-18 – 2023-12-29 (×12): 50 mg via ORAL
  Filled 2023-12-18 (×12): qty 2

## 2023-12-18 NOTE — Progress Notes (Signed)
 PROGRESS NOTE   Subjective/Complaints:  Pt reports abd stomach ache better- however been having worsening HA's slightly since yesterday- no other Neurological Sx's-   Having Daily- asking for something else ot help.  LBM yesterday ROS:l  Pt denies SOB, abd pain, CP, N/V/C/D, and vision changes  Objective:   No results found.  No results for input(s): "WBC", "HGB", "HCT", "PLT" in the last 72 hours.  No results for input(s): "NA", "K", "CL", "CO2", "GLUCOSE", "BUN", "CREATININE", "CALCIUM " in the last 72 hours.    Intake/Output Summary (Last 24 hours) at 12/18/2023 1007 Last data filed at 12/18/2023 0756 Gross per 24 hour  Intake 472 ml  Output 325 ml  Net 147 ml        Physical Exam: Vital Signs Blood pressure 111/74, pulse 69, temperature 98.4 F (36.9 C), temperature source Oral, resp. rate 18, height 5\' 10"  (1.778 m), weight 77.8 kg, SpO2 100%.     General: awake, alert, appropriate, sitting up in bed- dozed off initially;  NAD HENT: conjugate gaze; oropharynx moist CV: regular rate and rhythm; no JVD Pulmonary: CTA B/L; no W/R/R- good air movement GI: soft, NT, ND, (+)BS Psychiatric: appropriate Neurological: Ox3-sleepy- delayed responses, but appropriate Skin: No evidence of breakdown, no evidence of rash Neurologic: slow to arouse but awakens and cooperates with exam. Answers questions. Cranial nerves II through XII intact, motor strength is 5/5 in right and 0/5 left  deltoid, bicep, tricep, trace grip, hip flexor, knee extensors, ankle dorsiflexor and plantar flexor.  Clonus at the left ankle  Trace Left hip/knee ext synergy  Sensory exam normal sensation to light touch right side and LLE absent LT LUE but feels pinch  Cerebellar exam unable to perform on left due to weakness  Musculoskeletal: Full range of motion in all 4 extremities. No joint swelling  Assessment/Plan: 1. Functional deficits which  require 3+ hours per day of interdisciplinary therapy in a comprehensive inpatient rehab setting. Physiatrist is providing close team supervision and 24 hour management of active medical problems listed below. Physiatrist and rehab team continue to assess barriers to discharge/monitor patient progress toward functional and medical goals  Care Tool:  Bathing    Body parts bathed by patient: Left arm, Chest, Abdomen, Front perineal area, Right upper leg, Left upper leg, Face   Body parts bathed by helper: Right arm, Buttocks, Left lower leg, Right lower leg, Left arm     Bathing assist Assist Level: Moderate Assistance - Patient 50 - 74%     Upper Body Dressing/Undressing Upper body dressing   What is the patient wearing?: Pull over shirt    Upper body assist Assist Level: Minimal Assistance - Patient > 75%    Lower Body Dressing/Undressing Lower body dressing      What is the patient wearing?: Pants, Underwear/pull up     Lower body assist Assist for lower body dressing: Maximal Assistance - Patient 25 - 49%     Toileting Toileting    Toileting assist Assist for toileting: Total Assistance - Patient < 25%     Transfers Chair/bed transfer  Transfers assist  Chair/bed transfer activity did not occur: Safety/medical concerns  Chair/bed transfer assist  level: 2 Helpers (STEDY)     Locomotion Ambulation   Ambulation assist   Ambulation activity did not occur: Safety/medical concerns          Walk 10 feet activity   Assist  Walk 10 feet activity did not occur: Safety/medical concerns        Walk 50 feet activity   Assist Walk 50 feet with 2 turns activity did not occur: Safety/medical concerns         Walk 150 feet activity   Assist Walk 150 feet activity did not occur: Safety/medical concerns         Walk 10 feet on uneven surface  activity   Assist Walk 10 feet on uneven surfaces activity did not occur: Safety/medical concerns          Wheelchair     Assist Is the patient using a wheelchair?: Yes Type of Wheelchair: Manual    Wheelchair assist level: Dependent - Patient 0%      Wheelchair 50 feet with 2 turns activity    Assist        Assist Level: Dependent - Patient 0%   Wheelchair 150 feet activity     Assist      Assist Level: Dependent - Patient 0%   Blood pressure 111/74, pulse 69, temperature 98.4 F (36.9 C), temperature source Oral, resp. rate 18, height 5\' 10"  (1.778 m), weight 77.8 kg, SpO2 100%.  Medical Problem List and Plan: 1. 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             -patient may  shower             -ELOS/Goals: 5/29 days Min A  Con't CIR Severe CHF, changed to 15/7   PRAFO and WHO for LLE and LUE 2.  Antithrombotics: -DVT/anticoagulation: Eliquis              -antiplatelet therapy: ASA 81mg  daily 3. Pain Management/spasticity: Tylenol  as needed Post CVA HA  5/11- Will add topamax 50 mg QHS Bilateral knee pain , aching in am s- OA noted on Xrays in 2023 start diclofenac  gel   -emerging left sided tone.    -5/4 unable to tolerate even low dose baclofen --> stopped    -outpt botox candidate 4. Mood/Behavior/Sleep: Provide emotional support             -antipsychotic agents: N/A 5. Neuropsych/cognition: This patient is ?capable of making decisions on his own behalf. 6. Skin/Wound Care: Routine skin checks 7. Fluids/Electrolytes/Nutrition: Routine in and outs with follow-up chemistries Intake of fluids improved to yesterday  8.  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  Followed by heart failure team.  Monitor for any signs of fluid overload.  Toprol  XL 12.5 mg daily.  Monitor for any hypotension Vitals:   12/17/23 1931 12/18/23 0441  BP: 104/68 111/74  Pulse: 70 69  Resp: 18 18   Temp: 98.7 F (37.1 C) 98.4 F (36.9 C)  SpO2: 100% 100%    BPs on low side HR in 60s will stop metoprolol  and monitor BP and HR- BP and HR still on low side off metoprolol  5/9  5/10- BP  in high 90s' systolic and HR running around 60- will monitor  5/11- BP doing better mid 100s systolic- con't regimen Filed Weights   12/16/23 0426 12/17/23 0500 12/18/23 0443  Weight: 76.4 kg 76.7 kg  77.8 kg   5/6-7 stable weight  9.  CAD/MI/CABG.  Follow-up cardiology services.  Currently maintained on aspirin  and Plavix  10.  VT.  Last episode noted 2023 with successful ATP. 11.  CKD stage IIIA=  Baseline creatinine 1.3-1.5.  Follow-up chemistries,stable  Creat  5/11- will order labs for tomorrow    Latest Ref Rng & Units 12/13/2023    5:08 AM 12/08/2023    5:49 AM 12/07/2023    5:05 AM  BMP  Glucose 70 - 99 mg/dL   98   BUN 8 - 23 mg/dL   22   Creatinine 2.13 - 1.24 mg/dL 0.86  5.78  4.69   Sodium 135 - 145 mmol/L   136   Potassium 3.5 - 5.1 mmol/L   4.4   Chloride 98 - 111 mmol/L   103   CO2 22 - 32 mmol/L   23   Calcium  8.9 - 10.3 mg/dL   9.4     12.  HIV.  Continue Dovato - followed at the [facility] 13.  Hyperlipidemia.  Lipitor/Zetia  14.  GERD.  Pepcid  15.  Constipation.  MiraLAX  daily, Senokot changed to 2 po BID    -last 2 BM 5/2 and 5/5  5/10- LBM this AM  5/11- LBM yesterday 16.  Dysphagia.  Patient notes coughing on current diet of mechanical soft.  Dysphagia 2 thin liquids and have speech therapy follow-up.   MBS and FEES looked ok  17. Daily HA's since stroke- tylenol  is working-   -headaches seem fairly mild   5/10- denies HA this AM  5/11- HA bad again this AM- tylenol  help s, but having HA's daily- wants something to help- will Add Topamax 5- mg at bedtime for prevention 18. Mild stomach ache  5/10- asked nursing to give Mylanta as needed that's ordered   I spent a total of 38   minutes on total care today- >50% coordination of care- due to  D/w pt about HA's- as well as  Nursing- made med changes- review of exam as well   LOS: 12 days A FACE TO FACE EVALUATION WAS PERFORMED  Richard [name] 12/18/2023, 10:07 AM

## 2023-12-19 LAB — CBC WITH DIFFERENTIAL/PLATELET
Abs Immature Granulocytes: 0.01 10*3/uL (ref 0.00–0.07)
Basophils Absolute: 0 10*3/uL (ref 0.0–0.1)
Basophils Relative: 0 %
Eosinophils Absolute: 0.2 10*3/uL (ref 0.0–0.5)
Eosinophils Relative: 3 %
HCT: 35 % — ABNORMAL LOW (ref 39.0–52.0)
Hemoglobin: 11.9 g/dL — ABNORMAL LOW (ref 13.0–17.0)
Immature Granulocytes: 0 %
Lymphocytes Relative: 27 %
Lymphs Abs: 1.9 10*3/uL (ref 0.7–4.0)
MCH: 27.9 pg (ref 26.0–34.0)
MCHC: 34 g/dL (ref 30.0–36.0)
MCV: 82.2 fL (ref 80.0–100.0)
Monocytes Absolute: 0.7 10*3/uL (ref 0.1–1.0)
Monocytes Relative: 10 %
Neutro Abs: 4.2 10*3/uL (ref 1.7–7.7)
Neutrophils Relative %: 60 %
Platelets: 162 10*3/uL (ref 150–400)
RBC: 4.26 MIL/uL (ref 4.22–5.81)
RDW: 15.3 % (ref 11.5–15.5)
WBC: 7 10*3/uL (ref 4.0–10.5)
nRBC: 0 % (ref 0.0–0.2)

## 2023-12-19 LAB — COMPREHENSIVE METABOLIC PANEL WITH GFR
ALT: 75 U/L — ABNORMAL HIGH (ref 0–44)
AST: 34 U/L (ref 15–41)
Albumin: 3.2 g/dL — ABNORMAL LOW (ref 3.5–5.0)
Alkaline Phosphatase: 52 U/L (ref 38–126)
Anion gap: 10 (ref 5–15)
BUN: 17 mg/dL (ref 8–23)
CO2: 21 mmol/L — ABNORMAL LOW (ref 22–32)
Calcium: 8.9 mg/dL (ref 8.9–10.3)
Chloride: 108 mmol/L (ref 98–111)
Creatinine, Ser: 1.51 mg/dL — ABNORMAL HIGH (ref 0.61–1.24)
GFR, Estimated: 51 mL/min — ABNORMAL LOW (ref >60)
Glucose, Bld: 87 mg/dL (ref 70–99)
Potassium: 4 mmol/L (ref 3.5–5.1)
Sodium: 139 mmol/L (ref 135–145)
Total Bilirubin: 0.7 mg/dL (ref 0.0–1.2)
Total Protein: 6.4 g/dL — ABNORMAL LOW (ref 6.5–8.1)

## 2023-12-19 NOTE — Progress Notes (Signed)
 Occupational Therapy Session Note  Patient Details  Name: Richard [name] MRN: [id number] Date of Birth: 1955/12/04  Today's Date: 12/19/2023 OT Individual Time: 1405-1500 OT Individual Time Calculation (min): 55 min    Short Term Goals: Week 2:  OT Short Term Goal 1 (Week 2): Pt will complete toilet transfers with max A x1 OT Short Term Goal 2 (Week 2): Pt will maintain L UE in safe position when sitting up in wc with min verbal cues OT Short Term Goal 3 (Week 2): Pt will complete squat pivot transfers with max A x1 OT Short Term Goal 4 (Week 2): Pt will maintain sitting balance in preparation for functional transfers with CGA consistently  Skilled Therapeutic Interventions/Progress Updates:  Pt greeted supine in bed, pt agreeable to OT intervention.      Transfers/bed mobility/functional mobility:  Pt complete supine>sit to L side of bed with MAX A, most assist needed to elevate trunk into sitting. Utilized stedy for shower transfer with pt able to stand to stedy with MIN- MODA with L hemibody supported. Dependent transfer in/out of shower. Pt was able to stand in shower and EOB during bathing/dressing with MAX A +2 for safety, L lean noted.   ADLs:  UB dressing:pt donned OH shirt from EOB with MIN A with pt needing assist to thread LUE but then able to complete remainder of task.  LB dressing: pt donned pants and brief from EOB with MAX A, assist needed to thread pants and pull to waist line. Pt unable to maintain figure 4 from EOB Footwear: donned socks from bed level with total A for time mgmt  Bathing: pt completed bathing sitting on TTB with MODA, assist needed to wash RUE and lower legs.  Transfers: dependent transfer into shower with stedy.    Estim:  Saebo Stim One applied to pt's L Biceps for muscle activation. It was increased to level 4. 15 min attended e-stim with a focus on functional elbow flexion/extension during ADLs - no c/o pain before/after tx and no adverse  skin reactions.  330 pulse width 35 Hz pulse rate On 8 sec/ off 8 sec Ramp up/ down 2 sec Symmetrical Biphasic wave form  Max intensity at 500 Ohm load                 Ended session with pt supine in bed with all needs within reach and bed alarm activated.                    Therapy Documentation Precautions:  Precautions Precautions: Fall, Other (comment) Precaution/Restrictions Comments: L hemiplegia with flaccidity, R pusher (can correct to pull with vc), dysarthria Splint/Cast - Date Prophylactic Dressing Applied (if applicable):  (prafo boot) Restrictions Weight Bearing Restrictions Per Provider Order: No  Pain: no pain reported     Therapy/Group: Individual Therapy  Mollie Anger [facility] 12/19/2023, 3:13 PM

## 2023-12-19 NOTE — Progress Notes (Signed)
 Speech Language Pathology Daily Session Note  Patient Details  Name: Richard [name] MRN: [id number] Date of Birth: 23-Aug-1955  Today's Date: 12/19/2023 SLP Individual Time: 1200-1245 SLP Individual Time Calculation (min): 45 min  Short Term Goals: Week 2: SLP Short Term Goal 1 (Week 2): Patient will utilize compensatory strategies during consumption of least restrictive diet with supervision multimodal A SLP Short Term Goal 2 (Week 2): Patient will increase speech intelligibility to 90% with use of compensatory strategies given supervision multimodal A SLP Short Term Goal 3 (Week 2): Patient will demonstrate problem solving skills during mildly complex functional situations given min multimodal A SLP Short Term Goal 4 (Week 2): Patient will recall and utilize memory compensatory strategies given supervision multimodal A SLP Short Term Goal 5 (Week 2): Patient will sustain attention to functional tasks for upwards of 15 minutes given supervision multimodal A SLP Short Term Goal 6 (Week 2): Patient will demonstrate emergent awareness of mistakes given min multimodal A  Skilled Therapeutic Interventions: Skilled therapy session focused on dysphagia and communication goals. SLP facilitated session by observing patient with meal tray of upgraded textures (D3/thin). Patient with adequate mastication times and complete oral clearance with independent use of lingual sweep and liquid wash. Patient continues to present with intermittent throat clear, though recommend continuation of thin liquids per MBS results and oral improvements noted at bedside. Patient should continue to receive full supervision at this time due to recent upgrade in textures. Patient should continue to complete lingual/digital sweep as well as utilize liquid wash after solids. SLP targeted communication goals through review of speech intelligibility strategies. Patient recalled 3/4 strategies independently and reaminder with minA.  Patient utilized strategies with supervisionA during conversation with SLP and on the phone to order dinner tray to reach 100% intelligibility. Patient left in chair with alarm set and call bell in reach. Continue POC.    Pain None reported to SLP  Therapy/Group: Individual Therapy  Omya Winfield M.A., CCC-SLP 12/19/2023, 7:37 AM

## 2023-12-19 NOTE — Progress Notes (Signed)
 Speech Language Pathology Daily Session Note  Patient Details  Name: Richard [name] MRN: [id number] Date of Birth: 1955/12/03  Today's Date: 12/19/2023 SLP Individual Time: 0800-0900 SLP Individual Time Calculation (min): 60 min  Short Term Goals: Week 2: SLP Short Term Goal 1 (Week 2): Patient will utilize compensatory strategies during consumption of least restrictive diet with supervision multimodal A SLP Short Term Goal 2 (Week 2): Patient will increase speech intelligibility to 90% with use of compensatory strategies given supervision multimodal A SLP Short Term Goal 3 (Week 2): Patient will demonstrate problem solving skills during mildly complex functional situations given min multimodal A SLP Short Term Goal 4 (Week 2): Patient will recall and utilize memory compensatory strategies given supervision multimodal A SLP Short Term Goal 5 (Week 2): Patient will sustain attention to functional tasks for upwards of 15 minutes given supervision multimodal A SLP Short Term Goal 6 (Week 2): Patient will demonstrate emergent awareness of mistakes given min multimodal A  Skilled Therapeutic Interventions:   Pt greeted at bedside. SLP facilitated a variety of tx tasks targeting cognition and speech production. During initial conversation re recent events, he was able to recall diet upgrade to D3 textures this weekend (by nursing?) and reported no concerns with diet tolerance. Of note, pt was/is scheduled for tx session w/ SLP this afternoon to assess diet tolerance w/ D3 textures. SLP planned to ensure tolerance prior to upgrade, though premature upgrade occurred this weekend. Pt was able to recall 3/4 compensatory speech strategies independently, requiring only s cues to recall 4/4. Throughout tx tasks, he was able to utilize his speech strategies during 1 word - phrase length responses w/ minA to maintain 95% intelligibility or greater. He also completed a verbal time management task and benefited  from minA cues for working memory, sustained attention, and calculations. Task interrupted by request to use the restroom. SLP and RNT assisted pt to the toilet via steady. He benefited from totalA for toileting and minA cues for awareness, problem solving, and sequencing. No BM or urination noted. He was then assisted to his wheelchair and required minA cues to summarize ST tx session for his memory notebook. He was left in his chair with the alarm set and call light within reach. Recommend cont ST.   Pain 2/10 headache.  Location: back of the head.  Pt already received pain medication prior to ST tx session. See EMR for more info.   Therapy/Group: Individual Therapy  Rozell Cornet 12/19/2023, 8:19 AM

## 2023-12-19 NOTE — Progress Notes (Signed)
 PROGRESS NOTE   Subjective/Complaints:  Working with SLP on cognition, requested BR assist  for BM, but only had flatus  ROS:l  Pt denies SOB, abd pain, CP, N/V/C/D, and vision changes  Objective:   No results found.  Recent Labs    12/19/23 0523  WBC 7.0  HGB 11.9*  HCT 35.0*  PLT 162    Recent Labs    12/19/23 0523  NA 139  K 4.0  CL 108  CO2 21*  GLUCOSE 87  BUN 17  CREATININE 1.51*  CALCIUM  8.9      Intake/Output Summary (Last 24 hours) at 12/19/2023 0851 Last data filed at 12/19/2023 0400 Gross per 24 hour  Intake 118 ml  Output 1175 ml  Net -1057 ml        Physical Exam: Vital Signs Blood pressure 95/68, pulse 68, temperature 98.5 F (36.9 C), temperature source Oral, resp. rate 16, height 5\' 10"  (1.778 m), weight 74.9 kg, SpO2 97%.  Neurological: Ox3-sleepy- delayed responses, but appropriate Skin: No evidence of breakdown, no evidence of rash Neurologic: slow to arouse but awakens and cooperates with exam. Answers questions. Cranial nerves II through XII intact, motor strength is 5/5 in right and 0/5 left  deltoid, bicep, tricep, trace grip, hip flexor, knee extensors, ankle dorsiflexor and plantar flexor.  Clonus at the left ankle  Trace Left hip/knee ext synergy  Sensory exam normal sensation to light touch right side and LLE absent LT LUE but feels pinch  Cerebellar exam unable to perform on left due to weakness  Musculoskeletal: Full range of motion in all 4 extremities. No joint swelling  Assessment/Plan: 1. Functional deficits which require 3+ hours per day of interdisciplinary therapy in a comprehensive inpatient rehab setting. Physiatrist is providing close team supervision and 24 hour management of active medical problems listed below. Physiatrist and rehab team continue to assess barriers to discharge/monitor patient progress toward functional and medical goals  Care  Tool:  Bathing    Body parts bathed by patient: Left arm, Chest, Abdomen, Front perineal area, Right upper leg, Left upper leg, Face   Body parts bathed by helper: Right arm, Buttocks, Left lower leg, Right lower leg, Left arm     Bathing assist Assist Level: Moderate Assistance - Patient 50 - 74%     Upper Body Dressing/Undressing Upper body dressing   What is the patient wearing?: Pull over shirt    Upper body assist Assist Level: Minimal Assistance - Patient > 75%    Lower Body Dressing/Undressing Lower body dressing      What is the patient wearing?: Pants, Underwear/pull up     Lower body assist Assist for lower body dressing: Maximal Assistance - Patient 25 - 49%     Toileting Toileting    Toileting assist Assist for toileting: Total Assistance - Patient < 25%     Transfers Chair/bed transfer  Transfers assist  Chair/bed transfer activity did not occur: Safety/medical concerns  Chair/bed transfer assist level: 2 Helpers (STEDY)     Locomotion Ambulation   Ambulation assist   Ambulation activity did not occur: Safety/medical concerns          Walk 10  feet activity   Assist  Walk 10 feet activity did not occur: Safety/medical concerns        Walk 50 feet activity   Assist Walk 50 feet with 2 turns activity did not occur: Safety/medical concerns         Walk 150 feet activity   Assist Walk 150 feet activity did not occur: Safety/medical concerns         Walk 10 feet on uneven surface  activity   Assist Walk 10 feet on uneven surfaces activity did not occur: Safety/medical concerns         Wheelchair     Assist Is the patient using a wheelchair?: Yes Type of Wheelchair: Manual    Wheelchair assist level: Dependent - Patient 0%      Wheelchair 50 feet with 2 turns activity    Assist        Assist Level: Dependent - Patient 0%   Wheelchair 150 feet activity     Assist      Assist Level:  Dependent - Patient 0%   Blood pressure 95/68, pulse 68, temperature 98.5 F (36.9 C), temperature source Oral, resp. rate 16, height 5\' 10"  (1.778 m), weight 74.9 kg, SpO2 97%.  Medical Problem List and Plan: 1. 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             -patient may  shower             -ELOS/Goals: 5/29 days Min A  Con't CIR Severe CHF, changed to 15/7   PRAFO and WHO for LLE and LUE 2.  Antithrombotics: -DVT/anticoagulation: Eliquis              -antiplatelet therapy: ASA 81mg  daily 3. Pain Management/spasticity: Tylenol  as needed Post CVA HA  5/11- Will add topamax 50 mg QHS Bilateral knee pain , aching in am s- OA noted on Xrays in 2023 start diclofenac  gel   -emerging left sided tone.    -5/4 unable to tolerate even low dose baclofen --> stopped    -outpt botox candidate 4. Mood/Behavior/Sleep: Provide emotional support             -antipsychotic agents: N/A 5. Neuropsych/cognition: This patient is ?capable of making decisions on his own behalf. 6. Skin/Wound Care: Routine skin checks 7. Fluids/Electrolytes/Nutrition: Routine in and outs with follow-up chemistries Intake of fluids improved to yesterday  8.  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  Followed by heart failure team.  Monitor for any signs of fluid overload.  Toprol  XL 12.5 mg daily.  Monitor for any hypotension Vitals:   12/18/23 1933 12/19/23 0448  BP: 104/64 95/68  Pulse: 87 68  Resp: 16 16  Temp: 99 F (37.2 C) 98.5 F (36.9 C)  SpO2: 100% 97%    BPs on low side HR in 60s will stop metoprolol  and monitor BP and HR- BP and HR still on low side off metoprolol  5/9  5/10- BP  in high 90s' systolic and HR running around 60- will monitor  5/11- BP doing better mid 100s systolic- con't regimen Filed Weights   12/17/23 0500 12/18/23  0443 12/19/23 0445  Weight: 76.7 kg 77.8 kg 74.9 kg   5/6-7 stable weight  9.  CAD/MI/CABG.  Follow-up cardiology services.  Currently maintained on aspirin  and Plavix  10.  VT.  Last episode noted 2023 with successful ATP. 11.  CKD stage IIIA=  Baseline creatinine 1.3-1.5.  Follow-up chemistries,stable  Creat  5/12 stable    Latest Ref Rng & Units 12/19/2023    5:23 AM 12/13/2023    5:08 AM 12/08/2023    5:49 AM  BMP  Glucose 70 - 99 mg/dL 87     BUN 8 - 23 mg/dL 17     Creatinine 9.60 - 1.24 mg/dL 4.54  0.98  1.19   Sodium 135 - 145 mmol/L 139     Potassium 3.5 - 5.1 mmol/L 4.0     Chloride 98 - 111 mmol/L 108     CO2 22 - 32 mmol/L 21     Calcium  8.9 - 10.3 mg/dL 8.9       12.  HIV.  Continue Dovato - followed at the [facility] 13.  Hyperlipidemia.  Lipitor/Zetia  14.  GERD.  Pepcid  15.  Constipation.  MiraLAX  daily, Senokot changed to 2 po BID    -last 2 BM 5/2 and 5/5  5/10- LBM this AM  5/11- LBM yesterday 16.  Dysphagia.  Patient notes coughing on current diet of mechanical soft.  Dysphagia 2 thin liquids and have speech therapy follow-up.   MBS and FEES looked ok  17. Daily HA's since stroke- tylenol  is working-   -headaches seem fairly mild   5/10- denies HA this AM  5/11- HA bad again this AM- tylenol  help s, but having HA's daily- wants something to help- will Add Topamax 50 mg at bedtime for prevention 18. Mild stomach ache  5/10- asked nursing to give Mylanta as needed that's ordered     LOS: 13 days A FACE TO FACE EVALUATION WAS PERFORMED  Genetta Kenning 12/19/2023, 8:51 AM

## 2023-12-19 NOTE — Progress Notes (Signed)
 Patient ID: Richard [name], male   DOB: [birth year], 68 y.o.   MRN: [id number]  have reached out to [facility] lia[facility] to see what the process it will take to get pt an aide for home via the [place]. Awaiting response from her

## 2023-12-19 NOTE — Progress Notes (Signed)
 Physical Therapy Session Note  Patient Details  Name: Richard [name] MRN: [id number] Date of Birth: [birth year]  Today's Date: 12/19/2023 PT Individual Time: 0917-1010 PT Individual Time Calculation (min): 53 min   Short Term Goals: Week 1:  PT Short Term Goals - Week 1 PT Short Term Goal 1 (Week 1): Pt will perform bed mobility with ModA +2. PT Short Term Goal 1 - Progress (Week 1): Met PT Short Term Goal 2 (Week 1): Pt will maintain seated balance with decreased push of RUE and increased trunk activation to maintain upright position with overall ModA for up to . PT Short Term Goal 2 - Progress (Week 1): Met PT Short Term Goal 3 (Week 1): Pt will perform sit<>stand using STEDY with MinA. PT Short Term Goal 3 - Progress (Week 1): Met PT Short Term Goal 4 (Week 1): Pt will initiate gait training PT Short Term Goal 4 - Progress (Week 1): Met PT Short Term Goals - Week 2 PT Short Term Goal 1 (Week 2): Pt will perform bed mobility with MinA. PT Short Term Goal 2 (Week 2): Pt will perform squat pivot transfers with MinA. PT Short Term Goal 3 (Week 2): Pt will perform sit<>stand transfers with ModA. PT Short Term Goal 4 (Week 2): Pt will ambulate at least 30 ft using LRAD with ModA +2. PT Short Term Goal 5 (Week 2): Pt will initiate w/c mobility training.  Skilled Therapeutic Interventions/Progress Updates:  Patient seated upright in TIS w/c on entrance to room. Patient alert and agreeable to PT session.   Patient with no pain complaint at start of session.  Therapeutic Activity: Transfers: Pt performed sit<>stand transfers with MinA to rise to stand and then Mod/ MaxA for balance once standing. Provided vc/ tc for upright posture, L quad activation, wider BOS with abducted LLE positioning.  Gait Training/ NMR:  DF wrap applied to LLE. Pt guided in gait training using 3 musketeer technique. Requires MinA from +2 and ModA from therapist in order to decrease push from RUE and to  allow for more upright posture. Pt is able to demo improved L quad activation during stance phase throughout. Is also able to demonstrate LLE initiation of advancement with hip flexion when pt facilitated into R weight shift. Is able to complete 77' x1/ 66' x1 with overall ModA with MinA from +2.   At handrail in hallway, pt guided in gait training over 30 feet with underarm support to L side. Continues to demonstrate impulsivity with attempt to ambulate without focus/ awareness to L side. Requires vc throughout for all sequencing and need for tc/ facilitation of lateral weight shift. Pushes to L side with hand placement to rail requiring MaxA for balance and positioning to midline depsite vc/ tc. Mod/ maxA for L advancement. Reduced amount of knee block during stance phase. Demonstrated intermittent minimal levels of quad activation throughout.   Attempt to decrease push with RUE with stance at rail and cues for bend at elbow and pull with RLE weight shift. Also attempted more forward hand placement to increase R lean and pressure into hand but pt continues to "stiff arm" and push against rail to L.   NMR performed for improvements in motor control and coordination, balance, sequencing, judgement, and self confidence/ efficacy in performing all aspects of mobility at highest level of independence.   Patient seated upright in TIS w/c at end of session with brakes locked, belt alarm set, and all needs within reach.  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   Therapy/Group: Individual Therapy  Donne Gage PT, DPT, CSRS 12/19/2023, 5:43 PM

## 2023-12-20 ENCOUNTER — Telehealth (HOSPITAL_COMMUNITY): Payer: Self-pay | Admitting: Pharmacy Technician

## 2023-12-20 ENCOUNTER — Other Ambulatory Visit (HOSPITAL_COMMUNITY): Payer: Self-pay

## 2023-12-20 NOTE — Plan of Care (Signed)
  Problem: Consults Goal: RH STROKE PATIENT EDUCATION Description: See Patient Education module for education specifics  Outcome: Progressing   Problem: RH BOWEL ELIMINATION Goal: RH STG MANAGE BOWEL WITH ASSISTANCE Description: STG Manage Bowel with mod I Assistance. Outcome: Progressing Goal: RH STG MANAGE BOWEL W/MEDICATION W/ASSISTANCE Description: STG Manage Bowel with Medication with mod I Assistance. Outcome: Progressing   Problem: RH BLADDER ELIMINATION Goal: RH STG MANAGE BLADDER WITH ASSISTANCE Description: STG Manage Bladder With toileting Assistance Outcome: Progressing   Problem: RH SAFETY Goal: RH STG ADHERE TO SAFETY PRECAUTIONS W/ASSISTANCE/DEVICE Description: STG Adhere to Safety Precautions With cues Assistance/Device. Outcome: Progressing   Problem: RH PAIN MANAGEMENT Goal: RH STG PAIN MANAGED AT OR BELOW PT'S PAIN GOAL Description: < 4 with prns Outcome: Progressing   Problem: RH KNOWLEDGE DEFICIT Goal: RH STG INCREASE KNOWLEDGE OF HYPERTENSION Description: Patient and spouse will be able to manage HTN using educational resources for medications and dietary modification independently Outcome: Progressing Goal: RH STG INCREASE KNOWLEDGE OF DYSPHAGIA/FLUID INTAKE Description: Patient and spouse will be able to manage Dysphagia using educational resources for medications and dietary modification independently Outcome: Progressing Goal: RH STG INCREASE KNOWLEGDE OF HYPERLIPIDEMIA Description: Patient and spouse will be able to manage HLD using educational resources for medications and dietary modification independently Outcome: Progressing Goal: RH STG INCREASE KNOWLEDGE OF STROKE PROPHYLAXIS Description: Patient and spouse will be able to manage secondary risks using educational resources for medications and dietary modification independently Outcome: Progressing

## 2023-12-20 NOTE — Progress Notes (Signed)
 PROGRESS NOTE   Subjective/Complaints:  No issues overnite, eating and drinking well this am , discussed BP and lower weight   ROS:l  Pt denies SOB, abd pain, CP, N/V/C/D, and vision changes  Objective:   No results found.  Recent Labs    12/19/23 0523  WBC 7.0  HGB 11.9*  HCT 35.0*  PLT 162    Recent Labs    12/19/23 0523  NA 139  K 4.0  CL 108  CO2 21*  GLUCOSE 87  BUN 17  CREATININE 1.51*  CALCIUM  8.9      Intake/Output Summary (Last 24 hours) at 12/20/2023 0805 Last data filed at 12/19/2023 1811 Gross per 24 hour  Intake 180 ml  Output --  Net 180 ml        Physical Exam: Vital Signs Blood pressure (!) 92/59, pulse 64, temperature 98.4 F (36.9 C), temperature source Oral, resp. rate 16, height 5\' 10"  (1.778 m), weight 72.3 kg, SpO2 97%.  Neurological: Ox3-sleepy- delayed responses, but appropriate Skin: No evidence of breakdown, no evidence of rash Neurologic: slow to arouse but awakens and cooperates with exam. Answers questions. Cranial nerves II through XII intact, motor strength is 5/5 in right and 0/5 left  deltoid, bicep, tricep, trace grip, hip flexor, knee extensors, ankle dorsiflexor and plantar flexor.  Clonus at the left ankle  Trace Left hip/knee ext synergy  Sensory exam normal sensation to light touch right side and LLE absent LT LUE but feels pinch -Unchanged 5/13 Cerebellar exam unable to perform on left due to weakness  Musculoskeletal: Full range of motion in all 4 extremities. No joint swelling  Assessment/Plan: 1. Functional deficits which require 3+ hours per day of interdisciplinary therapy in a comprehensive inpatient rehab setting. Physiatrist is providing close team supervision and 24 hour management of active medical problems listed below. Physiatrist and rehab team continue to assess barriers to discharge/monitor patient progress toward functional and medical  goals  Care Tool:  Bathing    Body parts bathed by patient: Left arm, Chest, Abdomen, Front perineal area, Buttocks, Right upper leg, Left upper leg, Face   Body parts bathed by helper: Right arm, Right lower leg, Left lower leg     Bathing assist Assist Level: Moderate Assistance - Patient 50 - 74%     Upper Body Dressing/Undressing Upper body dressing   What is the patient wearing?: Pull over shirt    Upper body assist Assist Level: Minimal Assistance - Patient > 75%    Lower Body Dressing/Undressing Lower body dressing      What is the patient wearing?: Pants, Underwear/pull up     Lower body assist Assist for lower body dressing: Maximal Assistance - Patient 25 - 49%     Toileting Toileting    Toileting assist Assist for toileting: Total Assistance - Patient < 25%     Transfers Chair/bed transfer  Transfers assist  Chair/bed transfer activity did not occur: Safety/medical concerns  Chair/bed transfer assist level: Dependent - mechanical lift (stedy)     Locomotion Ambulation   Ambulation assist   Ambulation activity did not occur: Safety/medical concerns  Walk 10 feet activity   Assist  Walk 10 feet activity did not occur: Safety/medical concerns        Walk 50 feet activity   Assist Walk 50 feet with 2 turns activity did not occur: Safety/medical concerns         Walk 150 feet activity   Assist Walk 150 feet activity did not occur: Safety/medical concerns         Walk 10 feet on uneven surface  activity   Assist Walk 10 feet on uneven surfaces activity did not occur: Safety/medical concerns         Wheelchair     Assist Is the patient using a wheelchair?: Yes Type of Wheelchair: Manual    Wheelchair assist level: Dependent - Patient 0%      Wheelchair 50 feet with 2 turns activity    Assist        Assist Level: Dependent - Patient 0%   Wheelchair 150 feet activity     Assist       Assist Level: Dependent - Patient 0%   Blood pressure (!) 92/59, pulse 64, temperature 98.4 F (36.9 C), temperature source Oral, resp. rate 16, height 5\' 10"  (1.778 m), weight 72.3 kg, SpO2 97%.  Medical Problem List and Plan: 1. 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             -patient may  shower             -ELOS/Goals: 5/29 days Min A  Con't CIR Severe CHF, changed to 15/7   PRAFO and WHO for LLE and LUE 2.  Antithrombotics: -DVT/anticoagulation: Eliquis              -antiplatelet therapy: ASA 81mg  daily 3. Pain Management/spasticity: Tylenol  as needed Post CVA HA  5/11- Will add topamax 50 mg QHS Bilateral knee pain , aching in am s- OA noted on Xrays in 2023 start diclofenac  gel   -emerging left sided tone.    -5/4 unable to tolerate even low dose baclofen --> stopped    -outpt botox candidate 4. Mood/Behavior/Sleep: Provide emotional support             -antipsychotic agents: N/A 5. Neuropsych/cognition: This patient is ?capable of making decisions on his own behalf. 6. Skin/Wound Care: Routine skin checks 7. Fluids/Electrolytes/Nutrition: Routine in and outs with follow-up chemistries Intake of fluids improved to yesterday  8.  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  Followed by heart failure team.  Monitor for any signs of fluid overload.  Toprol  XL 12.5 mg daily.  Monitor for any hypotension Vitals:   12/19/23 1953 12/20/23 0425  BP: 104/69 (!) 92/59  Pulse: 71 64  Resp: 16 16  Temp: 99 F (37.2 C) 98.4 F (36.9 C)  SpO2: 99% 97%    BP still on low side, off metoprolol , intake of recorded fluids poor but BUN ok yesterday, eating 40-80% yesterday but prior to that 100% Filed Weights   12/18/23 0443 12/19/23 0445 12/20/23 0425  Weight: 77.8 kg 74.9 kg 72.3 kg   Weight coming down  9.   CAD/MI/CABG.  Follow-up cardiology services.  Currently maintained on aspirin  and Plavix  10.  VT.  Last episode noted 2023 with successful ATP. 11.  CKD stage IIIA=  Baseline creatinine 1.3-1.5.  Follow-up chemistries,stable  Creat  5/12 stable    Latest Ref Rng &  Units 12/19/2023    5:23 AM 12/13/2023    5:08 AM 12/08/2023    5:49 AM  BMP  Glucose 70 - 99 mg/dL 87     BUN 8 - 23 mg/dL 17     Creatinine 1.61 - 1.24 mg/dL 0.96  0.45  4.09   Sodium 135 - 145 mmol/L 139     Potassium 3.5 - 5.1 mmol/L 4.0     Chloride 98 - 111 mmol/L 108     CO2 22 - 32 mmol/L 21     Calcium  8.9 - 10.3 mg/dL 8.9       12.  HIV.  Continue Dovato - followed at the [facility] 13.  Hyperlipidemia.  Lipitor/Zetia  14.  GERD.  Pepcid  15.  Constipation.  MiraLAX  daily, Senokot changed to 2 po BID    LBM 5/11 16.  Dysphagia.  Patient notes coughing on current diet of mechanical soft.  Dysphagia 2 thin liquids and have speech therapy follow-up.   MBS and FEES looked ok  17. Daily HA's since stroke- tylenol  + topamax started 5/11, monitor result      LOS: 14 days A FACE TO FACE EVALUATION WAS PERFORMED  Genetta Kenning 12/20/2023, 8:05 AM

## 2023-12-20 NOTE — Telephone Encounter (Signed)
 Patient Product/process development scientist completed.    The patient is insured through [employer]. Patient has Medicare and is not eligible for a copay card, but may be able to apply for patient assistance or Medicare RX Payment Plan (Patient Must reach out to their plan, if eligible for payment plan), if available.    Ran test claim for Eliquis  5 mg and the current 30 day co-pay is $45.00.   This test claim was processed through [facility]- copay amounts may vary at other pharmacies due to pharmacy/plan contracts, or as the patient moves through the different stages of their insurance plan.     Morgan Arab, CPHT Pharmacy Technician III Certified Patient Advocate [facility] Patient Advocate Team Direct Number: [phone]  Fax: [phone]

## 2023-12-20 NOTE — Progress Notes (Signed)
 Speech Language Pathology Daily Session Note  Patient Details  Name: Richard [name] MRN: [id number] Date of Birth: 1956/02/10  Today's Date: 12/20/2023 SLP Individual Time: 0900-1000 SLP Individual Time Calculation (min): 60 min  Short Term Goals: Week 2: SLP Short Term Goal 1 (Week 2): Patient will utilize compensatory strategies during consumption of least restrictive diet with supervision multimodal A SLP Short Term Goal 2 (Week 2): Patient will increase speech intelligibility to 90% with use of compensatory strategies given supervision multimodal A SLP Short Term Goal 3 (Week 2): Patient will demonstrate problem solving skills during mildly complex functional situations given min multimodal A SLP Short Term Goal 4 (Week 2): Patient will recall and utilize memory compensatory strategies given supervision multimodal A SLP Short Term Goal 5 (Week 2): Patient will sustain attention to functional tasks for upwards of 15 minutes given supervision multimodal A SLP Short Term Goal 6 (Week 2): Patient will demonstrate emergent awareness of mistakes given min multimodal A  Skilled Therapeutic Interventions:   Pt greeted in his room. He was awake/alert upon SLP arrival and pleasant throughout tx tasks targeting cognition and speech production. SLP facilitated tongue strengthening via IOPI. Given modA for technique and maximal effort, he was able to complete 20 reps at 15-16 kpa. He also utilized his calendar to provide orientation information w/ only s cues. He then completed a verbal problem solving task providing similarities/differences w/ minA for organization and reasoning. He required minA to utilize speech strategies and maintain 90% intelligibility or greater throughout the task. He then requested assistance to the bathroom. RNT and SLP assisted him via steady and provided totalA for toileting. He required minA for problem solving and sequencing during transfer back to chair and was left in  his chair with the alarm set and call light within reach. Recommend cont ST per POC.   Pain Pain Assessment Pain Scale: 0-10 Pain Score: 0-No pain  Therapy/Group: Individual Therapy  Rozell Cornet 12/20/2023, 9:21 AM

## 2023-12-20 NOTE — Group Note (Signed)
 Patient Details Name: Richard [name] MRN: [id number] DOB: Jun 04, 1956 Today's Date: 12/20/2023  Time Calculation: OT Group Time Calculation OT Group Start Time: 1200 OT Group Stop Time: 1240 OT Group Time Calculation (min): 40 min      Group Description: [employer]: Patient participated in [employer] with both OT and SLP with focus on self-feeding, dysphagia goals, cognitive goals, and appropriate social interaction within a group environment.   Individual level documentation: Patient participated in diner's club and required supervision assist for functional use of left UE during self-feeding to place it in safe position (on the table for eating in visual site. Also attempted to use it as stabilizer with eating items in small container.   Patient participated in d[employer]'s club with focus on dysphagia goals. Patient consumed their lunch meal of dysphasia 3 and thin liquids. Patient consumed meal inconsistent overt s/s of aspiration and required supervision assist for use of swallowing compensatory strategies. Recommend patient continue current diet.   Patient participated in [employer] club with focus on cognitive goals. Patient required (drop down selection supervision assist for selective attention in a mildly distracting environment. Patient also required min assist for visual scanning to left field of environment. Patient also demonstrated appropriate social interaction within a group environment with overall supervision assist.   Pain:  No c/o pain in session -        [name] [facility] 12/20/2023, 1:23 PM

## 2023-12-20 NOTE — Progress Notes (Addendum)
 Speech Language Pathology Daily Session Note  Patient Details  Name: Richard [name] MRN: [id number] Date of Birth: 06-11-1956  Today's Date: 12/20/2023 SLP Individual Time: 0231-0332 SLP Individual Time Calculation (min): 61 min  Short Term Goals: Week 2: SLP Short Term Goal 1 (Week 2): Patient will utilize compensatory strategies during consumption of least restrictive diet with supervision multimodal A SLP Short Term Goal 2 (Week 2): Patient will increase speech intelligibility to 90% with use of compensatory strategies given supervision multimodal A SLP Short Term Goal 3 (Week 2): Patient will demonstrate problem solving skills during mildly complex functional situations given min multimodal A SLP Short Term Goal 4 (Week 2): Patient will recall and utilize memory compensatory strategies given supervision multimodal A SLP Short Term Goal 5 (Week 2): Patient will sustain attention to functional tasks for upwards of 15 minutes given supervision multimodal A SLP Short Term Goal 6 (Week 2): Patient will demonstrate emergent awareness of mistakes given min multimodal A  Skilled Therapeutic Interventions:  Patient was seen in am to address speech intelligibility and cognitive re- training. Pt easily alerted upon SLP arrival and agreeable for session. Upon SLP arrival, pt able to recall swallowing strategies for no straws as this SLP offered pt a cup of water.  SLP introduced WRAP compensatory strategies and examples of utilization. Pt subsequently challenged in paragraph retention of a moderate level paragraph and guided pt in utilization of repetition and association strategies. Given a 15 minute delay pt recalled information with 100% acc indep in one trial and 50% acc in another trial which improved to 75% with min A. In other minutes of session SLP addressed speech intelligibility. Pt recalled 2 of 4 speech intelligibility strategies indep. SLP reviewed remaining two and examples of  utilization. Pt was challenged to utilize speech intelligibility strategies in a structured task to communicate an unknown topic to listener, pt's speech intelligibility ranged from 75- 90% throughout given min cues for over articulation and slow rate. Problem solving was addressed through challenging pt to identify daily living problems given picture cards. Pt completed task with 100% acc and sup A. At conclusion of session pt was left in bed with call button within reach and bed alarm active. SLP to continue POC.   Pain Pain Assessment Pain Scale: 0-10 Pain Score: 0-No pain  Therapy/Group: Individual Therapy  Adela Holter 12/20/2023, 3:23 PM

## 2023-12-20 NOTE — Group Note (Unsigned)
 Patient Details Name: MOSS SEURER MRN: [id number] DOB: 24-Feb-1956 Today's Date: 12/20/2023  Time Calculation:        Group Description: [employer]: Patient participated in [employer] with both OT and SLP with focus on self-feeding, dysphagia goals, cognitive goals, and appropriate social interaction within a group environment.    Individual level documentation: Patient participated in [employer]iner's club and required {assistance:28279} assist for functional use of {RIGHT/LEFT:20294} UE during self-feeding.   Patient participated in [employer] club with focus on dysphagia goals. Patient consumed their lunch meal of {diet with liquids:28280}. Patient consumed meal {consistent/inconsistent:28281} overt s/s of aspiration and required {assistance:28279} assist for use of swallowing compensatory strategies. Recommend patient continue current diet.   Patient participated in [employer]'s club with focus on cognitive goals. Patient required (drop down selection {assistance:28279} assist for selective attention in a mildly distracting environment. Patient also required {assistance:28279} assist for visual scanning to {RIGHT/LEFT:20294} field of environment. Patient also demonstrated appropriate social interaction within a group environment with overall {assistance:28279} assist.   Pain:    Precautions:     [name] [facility] 12/20/2023, 1:11 PM

## 2023-12-21 NOTE — Progress Notes (Deleted)
 Cardiology Clinic Note   Patient Name: Richard [name] Date of Encounter: 12/21/2023  Primary Care Provider:  Nohemi Batters, MD Primary Cardiologist:  Magnus Schuller, MD  Patient Profile    Richard [name] 68 year old male presents the clinic today for follow-up evaluation of his coronary artery disease and ventricular tachycardia.  Past Medical History    Past Medical History:  Diagnosis Date   Coronary artery disease    a. cath 04/02/2014 3v dx 80-90% in D1, 80-90% mid LAD, 95% apical LAD, 95% distal inferior LV branch of LCx, total occlusion of prox RCA  b). Cath 04/03/2014 DES to mid LAD, DES x3 to prox D1, DAPT indefinitely. LCx lesion untreated   GERD (gastroesophageal reflux disease)    HIV (human immunodeficiency virus infection) (HCC)    Hypertension    Ischemic cardiomyopathy 08/09/2013   EF 20-25 percent by echo in 04/2014, s/p [employer] Jude [place] model [id number] serial number [id number]   Stroke [facility])    Past Surgical History:  Procedure Laterality Date   CARDIAC CATHETERIZATION  04/02/2014   DR Loetta Ringer   ESOPHAGOGASTRODUODENOSCOPY (EGD) WITH PROPOFOL  N/A 09/08/2018   Procedure: ESOPHAGOGASTRODUODENOSCOPY (EGD) WITH PROPOFOL ;  Surgeon: Alvis Jourdain, MD;  Location: [facility] ENDOSCOPY;  Service: Endoscopy;  Laterality: N/A;   IMPLANTABLE CARDIOVERTER DEFIBRILLATOR IMPLANT N/A 09/10/2014   Procedure: IMPLANTABLE CARDIOVERTER DEFIBRILLATOR IMPLANT;  Surgeon: Luana Rumple, MD;  [facility] model [id number] serial number [id number]   IR CT HEAD LTD  12/01/2023   IR PERCUTANEOUS ART THROMBECTOMY/INFUSION INTRACRANIAL INC DIAG ANGIO  12/01/2023   IR US  GUIDE VASC ACCESS RIGHT  12/01/2023   LEFT AND RIGHT HEART CATHETERIZATION WITH CORONARY ANGIOGRAM N/A 04/02/2014   Procedure: LEFT AND RIGHT HEART CATHETERIZATION WITH CORONARY ANGIOGRAM;  Surgeon: Millicent Ally, MD;  Location: [facility] CATH LAB;  Service: Cardiovascular;  Laterality: N/A;   MEDIAL COLLATERAL LIGAMENT  REPAIR, KNEE Right 08/25/2015   Procedure: RIGHT THUMB RADICAL COLLATERAL LIGAMENT REPAIR;  Surgeon: Florida Hurter, MD;  Location: [facility] OR;  Service: Orthopedics;  Laterality: Right;   PERCUTANEOUS CORONARY STENT INTERVENTION (PCI-S) N/A 04/03/2014   Procedure: PERCUTANEOUS CORONARY STENT INTERVENTION (PCI-S);  Surgeon: Peter M Swaziland, MD;  Location: [facility] CATH LAB;  Service: Cardiovascular;  Laterality: N/A;   RADIOLOGY WITH ANESTHESIA N/A 12/01/2023   Procedure: RADIOLOGY WITH ANESTHESIA;  Surgeon: Radiologist, Medication, MD;  Location: [facility] OR;  Service: Radiology;  Laterality: N/A;  Juana Nones DILATION N/A 09/08/2018   Procedure: SAVORY DILATION;  Surgeon: Alvis Jourdain, MD;  Location: [facility] ENDOSCOPY;  Service: Endoscopy;  Laterality: N/A;    Allergies  Allergies  Allergen Reactions   Lisinopril  Cough   Ioxaglate Hives   Ivp Dye [Iodinated Contrast Media] Hives    History of Present Illness    Richard [name] has a PMH of 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  He is status post [facility] Jude ICD.  His PMH also includes VT/VF which required ICD, CVA in 2008, HIV, schizophrenia, and MDD.  He underwent left heart cath in 2015 and was noted to have 80-90% D1, mid LAD 80-90%, distal LAD 95%, distal circumflex 95%, and CTO proximal RCA.  He received PCI with DES to his mid LAD, DES x 3 to his proximal D1.  He was seen and evaluated by cardiology during his admission from 12/01/2023 until 12/06/2023.  He presented with acute ischemic CVA.  He denied chest pain.  He was continued on aspirin  and Plavix .  Telemetry showed sinus  rhythm.  He was felt to be euvolemic.  There was no evidence of heart failure exacerbation.  He was weaned off of pressor support.  His heart failure medications were held in the setting of CVA.  It was felt that he may have had a cardioembolic event.  Cardiology recommended start of oral anticoagulation once it was felt safe from a neurological standpoint.  He  presents to the clinic today for follow-up evaluation and states***.  *** denies chest pain, shortness of breath, lower extremity edema, fatigue, palpitations, melena, hematuria, hemoptysis, diaphoresis, weakness, presyncope, syncope, orthopnea, and PND.  Chronic combined systolic and diastolic CHF-weight today.  Euvolemic.  Echocardiogram 03/03/2023 showed an LVEF of 30-35% and G1 DD. Heart healthy low-sodium diet Increase physical activity as tolerated Continue Jardiance , furosemide , metoprolol  Elevate lower extremities when not active Lower extremity support stockings Daily weights  Coronary artery disease-no chest pain today.  Denies exertional type symptoms.  LHC 2015 showed 80-90% D1, mid LAD 80-90%, distal LAD 95%, distal circumflex 95%.  He received PCI with DES to his mid LAD and DES x 3 to his proximal D1. Continue atorvastatin , ezetimibe , clopidogrel   CVA-head CT during admission showed acute ischemic stroke in R MCA and M1.  He underwent thrombectomy.  MRI head showed acute stroke above and chronic occlusion of L ICA. Continue apixaban , atorvastatin  Following with neurology  Hyperlipidemia-LDL***. High-fiber diet Increase physical activity as tolerated Continue atorvastatin   VT-denies recent episodes of lightheadedness, presyncope or syncope, elevated heart rate or irregular heartbeats.  He is status post ICD implant.  He previously had atrial fibrillation in the setting of abrupt discontinuation of beta-blocker.  Heart rate today***. Maintain p.o. hydration  HIV-reports compliance with Dovato  Follows with PCP  Disposition: Follow-up with Dr. Alvis Ba or me in 3-4 months.  Home Medications    Prior to Admission medications   Medication Sig Start Date End Date Taking? Authorizing Provider  atorvastatin  (LIPITOR) 40 MG tablet Take 1 tablet by mouth once daily Patient taking differently: Take 40 mg by mouth at bedtime. 07/15/22   Croitoru, Mihai, MD  cetirizine (ZYRTEC)  10 MG tablet Take 10 mg by mouth daily.    [provider]  diphenhydrAMINE  (BENADRYL  ALLERGY) 25 mg capsule Take 25 mg by mouth every 6 (six) hours as needed for itching.    [provider]  dolutegravir -lamiVUDine  (DOVATO ) 50-300 MG tablet Take 1 tablet by mouth daily. 09/15/20   [provider]  DULoxetine (CYMBALTA) 30 MG capsule Take 30 mg by mouth in the morning and at bedtime.    [provider]  empagliflozin  (JARDIANCE ) 25 MG TABS tablet Take 12.5 mg by mouth in the morning.    [provider]  ezetimibe  (ZETIA ) 10 MG tablet Take 1 tablet (10 mg total) by mouth daily. 10/30/21   Millicent Ally, MD  fluticasone  (FLONASE ) 50 MCG/ACT nasal spray Place 1 spray into both nostrils daily.    [provider]  furosemide  (LASIX ) 20 MG tablet Take 20 mg by mouth daily as needed for fluid or edema. 06/25/14   [provider]  oxybutynin (DITROPAN) 5 MG tablet Take 5 mg by mouth in the morning and at bedtime. 08/19/21   [provider]  REFRESH LIQUIGEL 1 % GEL Place 2 drops into both eyes 4 (four) times daily.    [provider]  valACYclovir (VALTREX) 1000 MG tablet Take 1,000 mg by mouth 2 (two) times daily as needed (as directed for outbreaks).    [provider]    Family History    Family History  Problem Relation Age of Onset   Heart disease Mother    Esophageal cancer Neg Hx    Colon cancer Neg Hx    He indicated that his mother is deceased. He indicated that his father is deceased. He indicated that his sister is alive. He indicated that his brother is alive. He indicated that the status of his neg hx is unknown.  Social History    Social History   Socioeconomic History   Marital status: Married    Spouse name: Not on file   Number of children: 1   Years of education: Not on file   Highest education level: Not on file  Occupational History   Occupation: disable Vet  Tobacco Use   Smoking  status: Never   Smokeless tobacco: Never  Vaping Use   Vaping status: Never Used  Substance and Sexual Activity   Alcohol use: No   Drug use: No   Sexual activity: Not Currently  Other Topics Concern   Not on file  Social History Narrative   Not on file   Social Drivers of Health   Financial Resource Strain: Not on file  Food Insecurity: No Food Insecurity (12/02/2023)   Hunger Vital Sign    Worried About Running Out of Food in the Last Year: Never true    Ran Out of Food in the Last Year: Never true  Transportation Needs: No Transportation Needs (12/02/2023)   PRAPARE - Administrator, Civil Service (Medical): No    Lack of Transportation (Non-Medical): No  Physical Activity: Not on file  Stress: Not on file  Social Connections: Unknown (12/02/2023)   Social Connection and Isolation Panel [NHANES]    Frequency of Communication with Friends and Family: Never    Frequency of Social Gatherings with Friends and Family: Never    Attends Religious Services: Not on Marketing executive or Organizations: Not on file    Attends Banker Meetings: Not on file    Marital Status: Not on file  Intimate Partner Violence: Not At Risk (12/02/2023)   Humiliation, Afraid, Rape, and Kick questionnaire    Fear of Current or Ex-Partner: No    Emotionally Abused: No    Physically Abused: No    Sexually Abused: No     Review of Systems    General:  No chills, fever, night sweats or weight changes.  Cardiovascular:  No chest pain, dyspnea on exertion, edema, orthopnea, palpitations, paroxysmal nocturnal dyspnea. Dermatological: No rash, lesions/masses Respiratory: No cough, dyspnea Urologic: No hematuria, dysuria Abdominal:   No nausea, vomiting, diarrhea, bright red blood per rectum, melena, or hematemesis Neurologic:  No visual changes, wkns, changes in mental status. All other systems reviewed and are otherwise negative except as noted above.  Physical  Exam    VS:  There were no vitals taken for this visit. , BMI There is no height or weight on file to calculate BMI. GEN: Well nourished, well developed, in no acute distress. HEENT: normal. Neck: Supple, no JVD, carotid bruits, or masses. Cardiac: RRR, no murmurs, rubs, or gallops. No clubbing, cyanosis, edema.  Radials/DP/PT 2+ and equal bilaterally.  Respiratory:  Respirations regular and unlabored, clear to auscultation bilaterally. GI: Soft, nontender, nondistended, BS + x 4. MS: no deformity or atrophy. Skin: warm and dry, no rash. Neuro:  Strength and sensation are intact. Psych: Normal affect.  Accessory Clinical  Findings    Recent Labs: 12/19/2023: ALT 75; BUN 17; Creatinine, Ser 1.51; Hemoglobin 11.9; Platelets 162; Potassium 4.0; Sodium 139   Recent Lipid Panel    Component Value Date/Time   CHOL 82 12/02/2023 1717   CHOL 82 (L) 06/02/2021 0834   TRIG 53 12/02/2023 1717   HDL 28 (L) 12/02/2023 1717   HDL 33 (L) 06/02/2021 0834   CHOLHDL 2.9 12/02/2023 1717   VLDL 11 12/02/2023 1717   LDLCALC 43 12/02/2023 1717   LDLCALC 37 06/02/2021 0834         ECG personally reviewed by me today- ***     Echocardiogram 03/03/2023   IMPRESSIONS     1. Left ventricular ejection fraction, by estimation, is 30 to 35%. The  left ventricle has moderately decreased function. The left ventricle  demonstrates regional wall motion abnormalities (see scoring  diagram/findings for description). The left  ventricular internal cavity size was mildly dilated. Left ventricular  diastolic parameters are consistent with Grade I diastolic dysfunction  (impaired relaxation).   2. Right ventricular systolic function is mildly reduced. The right  ventricular size is normal.   3. Left atrial size was moderately dilated.   4. The mitral valve is normal in structure. Trivial mitral valve  regurgitation. No evidence of mitral stenosis.   5. The aortic valve is tricuspid. Aortic valve  regurgitation is trivial.  No aortic stenosis is present.   6. The inferior vena cava is normal in size with greater than 50%  respiratory variability, suggesting right atrial pressure of 3 mmHg.   FINDINGS   Left Ventricle: Left ventricular ejection fraction, by estimation, is 30  to 35%. The left ventricle has moderately decreased function. The left  ventricle demonstrates regional wall motion abnormalities. The left  ventricular internal cavity size was  mildly dilated. There is no left ventricular hypertrophy. Left ventricular  diastolic parameters are consistent with Grade I diastolic dysfunction  (impaired relaxation). Indeterminate filling pressures.     LV Wall Scoring:  The mid and distal anterior wall and mid anteroseptal segment are  akinetic.  The mid and distal lateral wall, inferior septum, posterior wall, basal  anteroseptal segment, mid anterolateral segment, basal anterior segment,  and  apex are hypokinetic. The entire inferior wall and basal anterolateral  segment are normal.   Right Ventricle: The right ventricular size is normal. No increase in  right ventricular wall thickness. Right ventricular systolic function is  mildly reduced.   Left Atrium: Left atrial size was moderately dilated.   Right Atrium: Right atrial size was normal in size.   Pericardium: There is no evidence of pericardial effusion.   Mitral Valve: The mitral valve is normal in structure. Trivial mitral  valve regurgitation. No evidence of mitral valve stenosis.   Tricuspid Valve: The tricuspid valve is normal in structure. Tricuspid  valve regurgitation is not demonstrated. No evidence of tricuspid  stenosis.   Aortic Valve: The aortic valve is tricuspid. Aortic valve regurgitation is  trivial. No aortic stenosis is present.   Pulmonic Valve: The pulmonic valve was normal in structure. Pulmonic valve  regurgitation is trivial. No evidence of pulmonic stenosis.   Aorta: The aortic  root is normal in size and structure.   Venous: The inferior vena cava is normal in size with greater than 50%  respiratory variability, suggesting right atrial pressure of 3 mmHg.   IAS/Shunts: No atrial level shunt detected by color flow Doppler.   Additional Comments: A device lead is visualized.  Assessment & Plan   1.  ***   Chet Cota. Adham Johnson NP-C     12/21/2023, 6:53 AM [facility] [address] Office [phone] Fax [phone]    I spent***minutes examining this patient, reviewing medications, and using patient centered shared decision making involving their cardiac care.   I spent  20 minutes reviewing past medical history,  medications, and prior cardiac tests.

## 2023-12-21 NOTE — Progress Notes (Signed)
 Speech Language Pathology Weekly Progress and Session Note  Patient Details  Name: Richard [name] MRN: [id number] Date of Birth: Dec 19, 1955  Beginning of progress report period: Dec 14, 2023 End of progress report period: Dec 21, 2023  Today's Date: 12/21/2023 SLP Individual Time: 1308-6578 SLP Individual Time Calculation (min): 40 min  Short Term Goals: Week 2: SLP Short Term Goal 1 (Week 2): Patient will utilize compensatory strategies during consumption of least restrictive diet with supervision multimodal A SLP Short Term Goal 1 - Progress (Week 2): Met SLP Short Term Goal 2 (Week 2): Patient will increase speech intelligibility to 90% with use of compensatory strategies given supervision multimodal A SLP Short Term Goal 2 - Progress (Week 2): Not met SLP Short Term Goal 3 (Week 2): Patient will demonstrate problem solving skills during mildly complex functional situations given min multimodal A SLP Short Term Goal 3 - Progress (Week 2): Met SLP Short Term Goal 4 (Week 2): Patient will recall and utilize memory compensatory strategies given supervision multimodal A SLP Short Term Goal 4 - Progress (Week 2): Not met SLP Short Term Goal 5 (Week 2): Patient will sustain attention to functional tasks for upwards of 15 minutes given supervision multimodal A SLP Short Term Goal 5 - Progress (Week 2): Met SLP Short Term Goal 6 (Week 2): Patient will demonstrate emergent awareness of mistakes given min multimodal A SLP Short Term Goal 6 - Progress (Week 2): Met    New Short Term Goals: Week 3: SLP Short Term Goal 1 (Week 3): Patient will increase speech intelligibility to 90% with use of compensatory strategies given supervision multimodal A SLP Short Term Goal 2 (Week 3): Patient will recall and utilize memory compensatory strategies given supervision multimodal A SLP Short Term Goal 3 (Week 3): Patient will demonstrate problem solving skills during mildly complex functional situations  given sup  multimodal A SLP Short Term Goal 4 (Week 3): Patient will utilize compensatory strategies during consumption of least restrictive diet with sup multimodal A  Weekly Progress Updates: Pt has made steady gains and has met 4 of 6 STG's this reporting period due to improved tolerance of Dys 3 solids, problem solving, and attention. Pt is currently an overall min to mod A for cognitive tasks and requires min A cues for utilization of swallowing compensatory strategeis to minimize overt s/s aspiration with regular solids and thin liquids. Pt/ family education ongoing. Pt would benefit from continued skilled SLP intervention to maximize cognition in order to maximize his functional independence prior to discharge   Intensity: Minumum of 1-2 x/day, 30 to 90 minutes Frequency: 3 to 5 out of 7 days Duration/Length of Stay: 5/29 Treatment/Interventions: Cognitive remediation/compensation;Cueing hierarchy;Dysphagia/aspiration precaution training;Functional tasks;Internal/external aids;Patient/family education;Speech/Language facilitation;Therapeutic Activities   Daily Session  Skilled Therapeutic Interventions: Patient was seen in am to address cognitive re- training and speech intelligibility. Direct handoff from PT with pt alert and seated upright in [facility]. Pt's wife was present for portion of session and participated intermittently. SLP initiated completion of IOPI with pt completing 20 reps at 17 kPa and mod cues for accuracy. In other minutes of session SLP reviewed WRAP compensatory strategies and examples of utilization. Due to success in previous session, SLP increased intensity of paragraph retention task to complex information. After a 15 minute distracted delay, pt recalled information with 20% acc and max cues. Suspect attention impeding success this date. L inattention and sustained attention addressed through a matching task with pt mod I for awareness ot  left however warranting min cues to  sustain attention for task duration ~4 minutes. Pt requesting to return to bed at conclusion of session. Pt warranting mod A to follow directions as NT and SLP assisted him back to bed. Pt left in bed with call button within reach and bed alarm active. SLP to continue POC.     General    Pain Pain Assessment Pain Scale: 0-10 Pain Score: 0-No pain Pain Location: Knee Pain Intervention(s): Medication (See eMAR)  Therapy/Group: Individual Therapy  Adela Holter 12/21/2023, 12:08 PM

## 2023-12-21 NOTE — Progress Notes (Signed)
 PROGRESS NOTE   Subjective/Complaints:  Eating well with CNA assist, participated in feeding group yesterday   ROS:l  Pt denies SOB, abd pain, CP, N/V/C/D, and vision changes  Objective:   No results found.  Recent Labs    12/19/23 0523  WBC 7.0  HGB 11.9*  HCT 35.0*  PLT 162    Recent Labs    12/19/23 0523  NA 139  K 4.0  CL 108  CO2 21*  GLUCOSE 87  BUN 17  CREATININE 1.51*  CALCIUM  8.9      Intake/Output Summary (Last 24 hours) at 12/21/2023 0754 Last data filed at 12/21/2023 0547 Gross per 24 hour  Intake 500 ml  Output 400 ml  Net 100 ml        Physical Exam: Vital Signs Blood pressure 103/77, pulse 63, temperature 98.5 F (36.9 C), temperature source Oral, resp. rate 16, height 5\' 10"  (1.778 m), weight 72.1 kg, SpO2 99%.  Neurological: Ox3-sleepy- delayed responses, but appropriate Skin: No evidence of breakdown, no evidence of rash Neurologic: slow to arouse but awakens and cooperates with exam. Answers questions. Cranial nerves II through XII intact, motor strength is 5/5 in right and 0/5 left  deltoid, bicep, tricep, trace grip, hip flexor, knee extensors, ankle dorsiflexor and plantar flexor.  Clonus at the left ankle  Trace Left hip/knee ext synergy  Sensory exam normal sensation to light touch right side and LLE absent LT LUE but feels pinch -Unchanged 5/13 Cerebellar exam unable to perform on left due to weakness  Musculoskeletal: Full range of motion in all 4 extremities. No joint swelling  Assessment/Plan: 1. Functional deficits which require 3+ hours per day of interdisciplinary therapy in a comprehensive inpatient rehab setting. Physiatrist is providing close team supervision and 24 hour management of active medical problems listed below. Physiatrist and rehab team continue to assess barriers to discharge/monitor patient progress toward functional and medical goals  Care  Tool:  Bathing    Body parts bathed by patient: Left arm, Chest, Abdomen, Front perineal area, Buttocks, Right upper leg, Left upper leg, Face   Body parts bathed by helper: Right arm, Right lower leg, Left lower leg     Bathing assist Assist Level: Moderate Assistance - Patient 50 - 74%     Upper Body Dressing/Undressing Upper body dressing   What is the patient wearing?: Pull over shirt    Upper body assist Assist Level: Minimal Assistance - Patient > 75%    Lower Body Dressing/Undressing Lower body dressing      What is the patient wearing?: Pants, Underwear/pull up     Lower body assist Assist for lower body dressing: Maximal Assistance - Patient 25 - 49%     Toileting Toileting    Toileting assist Assist for toileting: Total Assistance - Patient < 25%     Transfers Chair/bed transfer  Transfers assist  Chair/bed transfer activity did not occur: Safety/medical concerns  Chair/bed transfer assist level: Dependent - mechanical lift (stedy)     Locomotion Ambulation   Ambulation assist   Ambulation activity did not occur: Safety/medical concerns          Walk 10 feet activity  Assist  Walk 10 feet activity did not occur: Safety/medical concerns        Walk 50 feet activity   Assist Walk 50 feet with 2 turns activity did not occur: Safety/medical concerns         Walk 150 feet activity   Assist Walk 150 feet activity did not occur: Safety/medical concerns         Walk 10 feet on uneven surface  activity   Assist Walk 10 feet on uneven surfaces activity did not occur: Safety/medical concerns         Wheelchair     Assist Is the patient using a wheelchair?: Yes Type of Wheelchair: Manual    Wheelchair assist level: Dependent - Patient 0%      Wheelchair 50 feet with 2 turns activity    Assist        Assist Level: Dependent - Patient 0%   Wheelchair 150 feet activity     Assist      Assist  Level: Dependent - Patient 0%   Blood pressure 103/77, pulse 63, temperature 98.5 F (36.9 C), temperature source Oral, resp. rate 16, height 5\' 10"  (1.778 m), weight 72.1 kg, SpO2 99%.  Medical Problem List and Plan: 1. 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             -patient may  shower             -ELOS/Goals: 5/29 days Min A  Con't CIR, Team conference today please see physician documentation under team conference tab, met with team  to discuss problems,progress, and goals. Formulized individual treatment plan based on medical history, underlying problem and comorbidities.  Severe CHF, changed to 15/7   PRAFO and WHO for LLE and LUE 2.  Antithrombotics: -DVT/anticoagulation: Eliquis              -antiplatelet therapy: ASA 81mg  daily 3. Pain Management/spasticity: Tylenol  as needed Post CVA HA  5/11- Will add topamax 50 mg QHS Bilateral knee pain , aching in am s- OA noted on Xrays in 2023 start diclofenac  gel   -emerging left sided tone.    -5/4 unable to tolerate even low dose baclofen --> stopped    -outpt botox candidate 4. Mood/Behavior/Sleep: Provide emotional support             -antipsychotic agents: N/A 5. Neuropsych/cognition: This patient is ?capable of making decisions on his own behalf. 6. Skin/Wound Care: Routine skin checks 7. Fluids/Electrolytes/Nutrition: Routine in and outs with follow-up chemistries Intake of fluids improved to yesterday  8.  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  Followed by heart failure team.  Monitor for any signs of fluid overload.  Toprol  XL 12.5 mg daily.  Monitor for any hypotension Vitals:   12/20/23 1936 12/21/23 0435  BP: 103/74 103/77  Pulse: 72 63  Resp: 16 16  Temp: 98.8 F (37.1 C) 98.5 F (36.9 C)  SpO2: 99% 99%    BP still on low side, off  metoprolol ,HR mild brady to normal  Filed Weights   12/19/23 0445 12/20/23 0425 12/21/23 0432  Weight: 74.9 kg 72.3 kg 72.1 kg   Weight coming down  9.  CAD/MI/CABG.  Follow-up cardiology services.  Currently maintained on aspirin  and Plavix  10.  VT.  Last episode noted 2023 with successful ATP. 11.  CKD stage IIIA=  Baseline creatinine 1.3-1.5.  Follow-up chemistries,stable  Creat  5/12 stable    Latest Ref Rng & Units 12/19/2023    5:23 AM 12/13/2023    5:08 AM 12/08/2023    5:49 AM  BMP  Glucose 70 - 99 mg/dL 87     BUN 8 - 23 mg/dL 17     Creatinine 1.61 - 1.24 mg/dL 0.96  0.45  4.09   Sodium 135 - 145 mmol/L 139     Potassium 3.5 - 5.1 mmol/L 4.0     Chloride 98 - 111 mmol/L 108     CO2 22 - 32 mmol/L 21     Calcium  8.9 - 10.3 mg/dL 8.9       12.  HIV.  Continue Dovato - followed at the [facility] 13.  Hyperlipidemia.  Lipitor/Zetia  14.  GERD.  Pepcid  15.  Constipation.  MiraLAX  daily, Senokot changed to 2 po BID    LBM 5/11 16.  Dysphagia.  Patient notes coughing on current diet of mechanical soft.  Dysphagia 2 thin liquids and have speech therapy follow-up.   MBS and FEES looked ok  17. Daily HA's since stroke- tylenol  + topamax started 5/11, improved     LOS: 15 days A FACE TO FACE EVALUATION WAS PERFORMED  Genetta Kenning 12/21/2023, 7:54 AM

## 2023-12-21 NOTE — Progress Notes (Signed)
 Physical Therapy Session Note  Patient Details  Name: Richard [name] MRN: [id number] Date of Birth: 05-08-1956  Today's Date: 12/21/2023 PT Individual Time:  -      Short Term Goals: Week 1:  PT Short Term Goal 1 (Week 1): Pt will perform bed mobility with ModA +2. PT Short Term Goal 1 - Progress (Week 1): Met PT Short Term Goal 2 (Week 1): Pt will maintain seated balance with decreased push of RUE and increased trunk activation to maintain upright position with overall ModA for up to . PT Short Term Goal 2 - Progress (Week 1): Met PT Short Term Goal 3 (Week 1): Pt will perform sit<>stand using STEDY with MinA. PT Short Term Goal 3 - Progress (Week 1): Met PT Short Term Goal 4 (Week 1): Pt will initiate gait training PT Short Term Goal 4 - Progress (Week 1): Met Week 2:  PT Short Term Goal 1 (Week 2): Pt will perform bed mobility with MinA. PT Short Term Goal 2 (Week 2): Pt will perform squat pivot transfers with MinA. PT Short Term Goal 3 (Week 2): Pt will perform sit<>stand transfers with ModA. PT Short Term Goal 4 (Week 2): Pt will ambulate at least 30 ft using LRAD with ModA +2. PT Short Term Goal 5 (Week 2): Pt will initiate w/c mobility training.  Skilled Therapeutic Interventions/Progress Updates:  Patient seated upright in TIS w/c on entrance to room. Patient alert and agreeable to PT session.   Patient with no pain complaint at start of session.  Session with focus on setup, sequencing, and technique for squat pivot transfer.   Therapeutic Activity/ NMR: Transfers: NMR facilitated during session with focus on sitting balance, motor control, sequencing. Pt guided in blocked performance of squat pivot transfer. Pt provided with visual demonstration and verbal instructions prior to performance. W/c <> mat table and pt requires ModA initially. Is able to progress to CGA with vc for initiation and maxA for positioning LLE prior to all trials. Is able to differentiate  between pull required using RUE for transfer to R and push with RUE in order to complete transfer to L. Does require significant cueing throughout for positioning and initiation but is able to execute. Requires 2-3 attempts to reach initially and then able to perform in 1 attempt with seating adjustments made once reaching target seat.   Attempt to perform forward lean to scoot posteriorly in seat. In TIS, this is difficult d/t higher seat positioning and extensor tone in LLE preventing independent L foot positioning to floor. Pt prefers to pull R hip posteriorly but unable to sequence L hip following even with vc/ tc. Requires Mod/ MaxA to unweight L hemibody and hip from seat and move L hip posteriorly.  NMR performed for improvements in motor control and coordination, balance, sequencing, judgement, and self confidence/ efficacy in performing all aspects of mobility at highest level of independence.   Patient seated upright in TIS w/c at end of session with brakes locked, bed alarm set, and all needs within reach.   Therapy Documentation Precautions:  Precautions Precautions: Fall, Other (comment) Precaution/Restrictions Comments: L hemiplegia with flaccidity, R pusher (can correct to pull with vc), dysarthria Splint/Cast - Date Prophylactic Dressing Applied (if applicable):  (prafo boot) Restrictions Weight Bearing Restrictions Per Provider Order: No  Pain:  No pain complaint this session.    Therapy/Group: Individual Therapy  Donne Gage PT, DPT, CSRS 12/20/2023, 7:43 PM

## 2023-12-21 NOTE — Progress Notes (Signed)
 Patient ID: Richard [name], male   DOB: [birth year], 69 y.o.   MRN: [id number]  met with pt and will call wife to give team conference update regarding progress this week in therapies. He feels he is doing better and aware of his goals of min/mod assist. Wife is here daily and has observed in therapies. Will begin to have come in for education since will take several sessions for this. Will need equipment recommendations due to getting from the [place]. Therapy team aware of this. Work toward discharge of 5/29

## 2023-12-21 NOTE — Progress Notes (Addendum)
 Speech Language Pathology Daily Session Note  Patient Details  Name: Richard [name] MRN: [id number] Date of Birth: [birth year]  Today's Date: 12/21/2023 SLP Individual Time: 1325-1350 SLP Individual Time Calculation (min): 25 min Session 1: 1325-1350 (25 minutes) Session 2: 4098-1191 (35 minutes)  Short Term Goals: Week 3: SLP Short Term Goal 1 (Week 3): Patient will increase speech intelligibility to 90% with use of compensatory strategies given supervision multimodal A SLP Short Term Goal 2 (Week 3): Patient will recall and utilize memory compensatory strategies given supervision multimodal A SLP Short Term Goal 3 (Week 3): Patient will demonstrate problem solving skills during mildly complex functional situations given sup  multimodal A SLP Short Term Goal 4 (Week 3): Patient will utilize compensatory strategies during consumption of least restrictive diet with sup multimodal A  Skilled Therapeutic Interventions: Session 1: 1325-1350 Skilled therapy session focused on cognitive goals. SLP facilitated session by providing supervision A for patient to identify similar objects in a group and their overarching category. SLP targeted memory through completion of memory book for today's tx sessions. Patient recalled broad activities completed in ST and OT this am with supervision-minA. Patient then reported need to go to the bathroom. SLP to return to finish session as patient is available.   Session 2: SLP returned at 1408 when patient was finished in the bathroom to complete remainder of session. SLP targeted problem solving and attention goals through cross word task. Patient utilized clues and number of spaces avaliable to determine accurate word for space with supervision-minA. SLP targeted attention goals through providing mod i A for patient to attend to L side of page. During additional minutes of the session, SLP targeted communication goals. Patient was 90% intelligible at the  conversational level given supervision-minA. Patient left in chair with alarm set and call bell in reach. Continue POC.    Pain Session 1: None reported to SLP Session 2: None reported to SLP  Therapy/Group: Individual Therapy  Ailyne Pawley M.A., CCC-SLP 12/21/2023, 12:24 PM

## 2023-12-21 NOTE — Progress Notes (Signed)
 Physical Therapy Session Note  Patient Details  Name: Richard [name] MRN: [id number] Date of Birth: October 28, 1955  Today's Date: 12/21/2023 PT Individual Time: 3086-5784 PT Individual Time Calculation (min): 60 min   Short Term Goals: Week 1:  PT Short Term Goal 1 (Week 1): Pt will perform bed mobility with ModA +2. PT Short Term Goal 1 - Progress (Week 1): Met PT Short Term Goal 2 (Week 1): Pt will maintain seated balance with decreased push of RUE and increased trunk activation to maintain upright position with overall ModA for up to . PT Short Term Goal 2 - Progress (Week 1): Met PT Short Term Goal 3 (Week 1): Pt will perform sit<>stand using STEDY with MinA. PT Short Term Goal 3 - Progress (Week 1): Met PT Short Term Goal 4 (Week 1): Pt will initiate gait training PT Short Term Goal 4 - Progress (Week 1): Met Week 2:  PT Short Term Goal 1 (Week 2): Pt will perform bed mobility with MinA. PT Short Term Goal 2 (Week 2): Pt will perform squat pivot transfers with MinA. PT Short Term Goal 3 (Week 2): Pt will perform sit<>stand transfers with ModA. PT Short Term Goal 4 (Week 2): Pt will ambulate at least 30 ft using LRAD with ModA +2. PT Short Term Goal 5 (Week 2): Pt will initiate w/c mobility training. Week 3:     Skilled Therapeutic Interventions/Progress Updates:      Therapy Documentation Precautions:  Precautions Precautions: Fall, Other (comment) Precaution/Restrictions Comments: L hemiplegia with flaccidity, R pusher (can correct to pull with vc), dysarthria Splint/Cast - Date Prophylactic Dressing Applied (if applicable):  (prafo boot) Restrictions Weight Bearing Restrictions Per Provider Order: No General:   Vital Signs:   Pain: Pain Assessment Pain Scale: 0-10 Pain Score: 0-No pain Mobility:   Locomotion :    Trunk/Postural Assessment :    Balance:   Exercises:   Other Treatments:      Therapy/Group: Individual Therapy  Donne Gage PT, DPT,  CSRS 12/21/2023, 12:56 PM

## 2023-12-21 NOTE — Progress Notes (Signed)
 Occupational Therapy Weekly Progress Note  Patient Details  Name: Richard [name] MRN: [id number] Date of Birth: 03-20-1956  Beginning of progress report period: Dec 14, 2023 End of progress report period: Dec 21, 2023  Today's Date: 12/21/2023 OT Individual Time: 7846-9629 OT Individual Time Calculation (min): 58 min    Patient has met 4 of 4 short term goals. Mr. Berst is making steady progress towards reaching his LTGs. He is completing UB dressing min A, LB dressing max A, toileting total A, and U/LB bathing mod A. He is completing STEDY transfers with one person assist to use restroom and is completing squat pivot transfers with min-mod A. He is demonstrating improved awareness of his L UE positioning and is able to verbalize and assist with placing L UE on arm tray or in sling. He is planning to d/c home with assistance from his wife. She plans to participate in family education sessions closer to d/c date.   Patient continues to demonstrate the following deficits: muscle weakness and muscle joint tightness, decreased cardiorespiratoy endurance, impaired timing and sequencing, abnormal tone, unbalanced muscle activation, decreased coordination, and decreased motor planning, decreased attention to left and decreased motor planning, decreased attention, decreased awareness, decreased problem solving, decreased safety awareness, and decreased memory, central origin, and decreased sitting balance, decreased standing balance, decreased postural control, hemiplegia, and decreased balance strategies and therefore will continue to benefit from skilled OT intervention to enhance overall performance with BADL and Reduce care partner burden.  Patient progressing toward long term goals..  Continue plan of care.  OT Short Term Goals Week 2:  OT Short Term Goal 1 (Week 2): Pt will complete toilet transfers with max A x1 OT Short Term Goal 1 - Progress (Week 2): Met OT Short Term Goal 2 (Week 2):  Pt will maintain L UE in safe position when sitting up in wc with min verbal cues OT Short Term Goal 2 - Progress (Week 2): Met OT Short Term Goal 3 (Week 2): Pt will complete squat pivot transfers with max A x1 OT Short Term Goal 3 - Progress (Week 2): Met OT Short Term Goal 4 (Week 2): Pt will maintain sitting balance in preparation for functional transfers with CGA consistently OT Short Term Goal 4 - Progress (Week 2): Met Week 3:  OT Short Term Goal 1 (Week 3): Pt will maintain sitting balance in preparation for functional transfers with close supervision consistently OT Short Term Goal 2 (Week 3): Pt will complete one step of lower body dressing OT Short Term Goal 3 (Week 3): Pt will complete toilet transfers with mod A using LRAD  Skilled Therapeutic Interventions/Progress Updates:     Pt received reclined in bed presenting to be in good spirits receptive to skilled OT session reporting 0/10 pain- OT offering intermittent rest breaks, repositioning, and therapeutic support to optimize participation in therapy session. Pt did report some pain in his L elbow and L side of his neck during session. Focused this session on functional movement patterns, improving body awareness, and L UE functional use. Pt transitioned to EOB using modified log rolling technique with min A to fully lift trunk +mod verbal cues. Squat pivot to R EOB > TISWC min A +max verbal cues- Pt with improved learning of technique. Transported Pt total A to therapy gym in wc for time management and energy conservation. Squat pivot TISWC > EOM to R min A. Positioned large vertical mirror anterior to Pt for improved visual feedback, body awareness, and  midline orientation. Worked on standing balance, body awareness, and lateral weigh shifting in EVA RW while facilitating WB'ing through L U/LEs for improved proprioceptive feedback with activities also focused on decreasing lateral push to L. Pt completed sit<>stands during task with mod  A overall +max verbal cues to orient to midline and decrease lateral push. Engaged Pt in completing functional reaching activity removing squigz from mirror- Pt initially requiring total A to correct balance following anterior reach with R UE, fading to min-mod A with increased time and practice opportunities with OT stabilizing L LE (+2 assistance present throughout task to increase Pt's safety). Pt transitioned to supine position with mod A. Guided Pt through completing gentle neck stretches of lateral neck flexion and rotation to decrease pain and muscle stiffness to decreased pain reported following. Tone noted in L bicep and pectoralis muscles- engaged pt in completing slow prolonged stretch with noted improvement. Donned [place]o e-stim onto Pt's L triceps and engaged Pt in completing functional movement patterns of elbow flexion/extension brining hand to mouth for improved motor learning- Pt instructed to straighten elbow during "on" phase". Pt with improved bicep/triceps activation noted this session moving through partial ROM in gravity eliminated plane. Supine > EOM mod A. Squat pivot EOM > TISWC to L heavy min A. Transported Pt back to room total A in wc. Pt was left resting in TISWC with call bell in reach, seatbelt alarm on, and all needs met.    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

## 2023-12-22 ENCOUNTER — Ambulatory Visit: Payer: Medicare (Managed Care) | Attending: General Practice | Admitting: General Practice

## 2023-12-22 NOTE — Progress Notes (Signed)
 Physical Therapy Session Note  Patient Details  Name: Richard [name] MRN: [id number] Date of Birth: 10-Jan-1956  Today's Date: 12/22/2023 PT Individual Time: 1310-1425 PT Individual Time Calculation (min): 75 min   Short Term Goals: Week 2:  PT Short Term Goal 1 (Week 2): Pt will perform bed mobility with MinA. PT Short Term Goal 2 (Week 2): Pt will perform squat pivot transfers with MinA. PT Short Term Goal 3 (Week 2): Pt will perform sit<>stand transfers with ModA. PT Short Term Goal 4 (Week 2): Pt will ambulate at least 30 ft using LRAD with ModA +2. PT Short Term Goal 5 (Week 2): Pt will initiate w/c mobility training.  Skilled Therapeutic Interventions/Progress Updates: Patient supine in bed on entrance to room. Patient alert and agreeable to PT session.   Patient reported no pain. PTA retrieved low custom WC in order for pt to practice R hemi-technique WC propulsion.   Therapeutic Activity: Bed Mobility: Pt performed supine<>sit on EOB with overall minA to advance L LE off of bed, and mod/maxA to advance onto bed at end of session (pt fatigued, and acquired hip flexion decreased after interventions). Pt also minA to bring self up to sitting position with multimodal cues to engage R lateral trunk flexors. HOB was raised. Pt required closer to mostly minA to perform sit<>supine on hi/low mat with same cues. Transfers: Pt performed squat pivot transfers throughout session with modA at first from EOB<WC (potentially due to pt just waking up from a nap), and progressed to minA with cues for head/hip relationship and sequence (required maxA to place L LE in transfer ready position).  Gait Training:  Pt ambulated 20' using RHR outside of main gym with overall mod/maxA with noted L quadriceps activation at appropriate phase in gait cycle (little hip flexor activation noted). Pt also presented with push on R UE into PTA on L. Pt maxA to advance L LE forward, potentially due to heavy push  onto L LE. Pt required seated rest break.  Wheelchair Mobility:  Pt propelled wheelchair with supervision and VC for R hemi-technique! Pt started with only using R LE to propel WC around turns in main gym/hallway, then added R UE (needed increased time to obtain sequence). Pt propelled around loop (150'+) with continued supervision.  Neuromuscular Re-ed: NMR facilitated during session with focus on neuromuscular connection/feedback/control of L LE, proprioceptive feedback, standing posture. - Static standing while maintaining upright posture with added mirror feedback. PTA on L of pt providing mod/maxA to prevent buckling, and maxA to obtain knee extension. Pt cued to bring shoulders back and to look forward ahead at self in mirror, then cued to perform slight B knee flexion with power up and PTA still blocking/providing tactile stimulation to L quadriceps. Seated rest break required. - Sitting in WC with L LE propped on PTA leg and hi/low table to bias L hip flexors due to quad overpower. Pt cued to control eccentric after PTA passively moved into available PROM. Pt then supine on mat with cues to control eccentric after PTA moved extremity into hip flexion (SLR - heavy maxA to control with moments of noted hip flexion activation). Pt then cued to perform AROM with maxA required, then to control eccentric. Multiple reps of that performed with pt increasing ability to activate hip flexors (not controlled, but able to "jolt" into some hip flexion with assistance). Rest break. Pt cued to perform SLR with noted ability to do so unmeasured distance (few inches) off of mat with  minA. Pt cued to perform SLR until close to fatigue (required increased assistance). Pt then cued to control eccentric after passively moved into SLR until fatigue with mod-maxA to control.  NMR performed for improvements in motor control and coordination, balance, sequencing, judgement, and self confidence/ efficacy in performing all  aspects of mobility at highest level of independence.   Therapeutic Exercise: Pt performed the following exercises with therapist providing the described cuing and facilitation for improvement. - Passive stretch on L knee flexors due to increased tone presentation at beginning of session  Patient supine in bed at end of session with brakes locked, bed alarm set, and all needs within reach.      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

## 2023-12-22 NOTE — Progress Notes (Signed)
 PROGRESS NOTE   Subjective/Complaints:  No issues overnite  Pt's wife at bedside cuing him to slow down feeding   ROS:l  Pt denies SOB, abd pain, CP, N/V/C/D, and vision changes  Objective:   No results found.  No results for input(s): "WBC", "HGB", "HCT", "PLT" in the last 72 hours.   No results for input(s): "NA", "K", "CL", "CO2", "GLUCOSE", "BUN", "CREATININE", "CALCIUM " in the last 72 hours.     Intake/Output Summary (Last 24 hours) at 12/22/2023 0743 Last data filed at 12/21/2023 1800 Gross per 24 hour  Intake 714 ml  Output --  Net 714 ml        Physical Exam: Vital Signs Blood pressure 95/70, pulse 68, temperature 98.4 F (36.9 C), temperature source Oral, resp. rate 18, height 5\' 10"  (1.778 m), weight 72 kg, SpO2 100%.   General: No acute distress Mood and affect are appropriate Heart: Regular rate and rhythm no rubs murmurs or extra sounds Lungs: Clear to auscultation, breathing unlabored, no rales or wheezes Abdomen: Positive bowel sounds, soft nontender to palpation, nondistended Extremities: No clubbing, cyanosis, or edema   Skin: No evidence of breakdown, no evidence of rash Neurologic: slow to arouse but awakens and cooperates with exam. Answers questions. Neurological: Ox3- delayed responses, but appropriatemotor strength is 5/5 in right and 0/5 left  deltoid, bicep, tricep, trace grip, hip flexor, knee extensors, ankle dorsiflexor and plantar flexor.  Clonus at the left ankle  Trace Left hip/knee ext synergy  Sensory exam normal sensation to light touch right side and LLE absent LT LUE but feels pinch -Unchanged 5/13 Cerebellar exam unable to perform on left due to weakness  Musculoskeletal: Full range of motion in all 4 extremities. No joint swelling  Assessment/Plan: 1. Functional deficits which require 3+ hours per day of interdisciplinary therapy in a comprehensive inpatient rehab  setting. Physiatrist is providing close team supervision and 24 hour management of active medical problems listed below. Physiatrist and rehab team continue to assess barriers to discharge/monitor patient progress toward functional and medical goals  Care Tool:  Bathing    Body parts bathed by patient: Left arm, Chest, Abdomen, Front perineal area, Buttocks, Right upper leg, Left upper leg, Face   Body parts bathed by helper: Right arm, Right lower leg, Left lower leg     Bathing assist Assist Level: Moderate Assistance - Patient 50 - 74%     Upper Body Dressing/Undressing Upper body dressing   What is the patient wearing?: Pull over shirt    Upper body assist Assist Level: Minimal Assistance - Patient > 75%    Lower Body Dressing/Undressing Lower body dressing      What is the patient wearing?: Pants, Underwear/pull up     Lower body assist Assist for lower body dressing: Maximal Assistance - Patient 25 - 49%     Toileting Toileting    Toileting assist Assist for toileting: Total Assistance - Patient < 25%     Transfers Chair/bed transfer  Transfers assist  Chair/bed transfer activity did not occur: Safety/medical concerns  Chair/bed transfer assist level: Dependent - mechanical lift (stedy)     Locomotion Ambulation   Ambulation assist  Ambulation activity did not occur: Safety/medical concerns          Walk 10 feet activity   Assist  Walk 10 feet activity did not occur: Safety/medical concerns        Walk 50 feet activity   Assist Walk 50 feet with 2 turns activity did not occur: Safety/medical concerns         Walk 150 feet activity   Assist Walk 150 feet activity did not occur: Safety/medical concerns         Walk 10 feet on uneven surface  activity   Assist Walk 10 feet on uneven surfaces activity did not occur: Safety/medical concerns         Wheelchair     Assist Is the patient using a wheelchair?: Yes Type  of Wheelchair: Manual    Wheelchair assist level: Dependent - Patient 0%      Wheelchair 50 feet with 2 turns activity    Assist        Assist Level: Dependent - Patient 0%   Wheelchair 150 feet activity     Assist      Assist Level: Dependent - Patient 0%   Blood pressure 95/70, pulse 68, temperature 98.4 F (36.9 C), temperature source Oral, resp. rate 18, height 5\' 10"  (1.778 m), weight 72 kg, SpO2 100%.  Medical Problem List and Plan: 1. 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             -patient may  shower             -ELOS/Goals: 5/29 days Min A  Con't CIR,    PRAFO and WHO for LLE and LUE 2.  Antithrombotics: -DVT/anticoagulation: Eliquis              -antiplatelet therapy: ASA 81mg  daily 3. Pain Management/spasticity: Tylenol  as needed Post CVA HA resolved   5/11- Will add topamax 50 mg at bedtime 5/15 reduce topamax to 25mg  and monitor  Bilateral knee pain , aching in am s- OA noted on Xrays in 2023 start diclofenac  gel   -emerging left sided tone.    -5/4 unable to tolerate even low dose baclofen --> stopped    -outpt botox candidate 4. Mood/Behavior/Sleep: Provide emotional support             -antipsychotic agents: N/A 5. Neuropsych/cognition: This patient is ?capable of making decisions on his own behalf. 6. Skin/Wound Care: Routine skin checks 7. Fluids/Electrolytes/Nutrition: Routine in and outs with follow-up chemistries Intake of fluids improved to yesterday  8.  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  Followed by heart failure team.  Monitor for any signs of fluid overload.  Toprol  XL 12.5 mg daily.  Monitor for any hypotension Vitals:   12/21/23 2020 12/22/23 0600  BP: (!) 94/44 95/70  Pulse: 71 68  Resp: 17 18  Temp: 98.5 F (36.9 C) 98.4 F (36.9 C)  SpO2: 99%  100%    BP still on low side, off metoprolol ,HR mild brady to normal  Filed Weights   12/20/23 0425 12/21/23 0432 12/22/23 0600  Weight: 72.3 kg 72.1 kg 72 kg   Weight coming down  9.  CAD/MI/CABG.  Follow-up cardiology services.  Currently maintained on aspirin  and Plavix  10.  VT.  Last episode noted 2023 with successful ATP. 11.  CKD stage IIIA=  Baseline creatinine 1.3-1.5.  Follow-up chemistries,stable  Creat  5/15 stable    Latest Ref Rng & Units 12/19/2023    5:23 AM 12/13/2023    5:08 AM 12/08/2023    5:49 AM  BMP  Glucose 70 - 99 mg/dL 87     BUN 8 - 23 mg/dL 17     Creatinine 1.61 - 1.24 mg/dL 0.96  0.45  4.09   Sodium 135 - 145 mmol/L 139     Potassium 3.5 - 5.1 mmol/L 4.0     Chloride 98 - 111 mmol/L 108     CO2 22 - 32 mmol/L 21     Calcium  8.9 - 10.3 mg/dL 8.9       12.  HIV.  Continue Dovato - followed at the [facility] 13.  Hyperlipidemia.  Lipitor/Zetia  14.  GERD.  Pepcid  15.  Constipation.  MiraLAX  daily, Senokot changed to 2 po BID    LBM 5/11 16.  Dysphagia.  Patient notes coughing on current diet of mechanical soft.  Dysphagia 2 thin liquids and have speech therapy follow-up.   MBS and FEES looked ok  17. Daily HA's since stroke- tylenol  + topamax started 5/11, improved- try reducing dose to 25mg  and monitor   LOS: 16 days A FACE TO FACE EVALUATION WAS PERFORMED  Genetta Kenning 12/22/2023, 7:43 AM

## 2023-12-22 NOTE — Patient Care Conference (Signed)
 Inpatient RehabilitationTeam Conference and Plan of Care Update Date: 12/21/2023   Time: 10:41 AM    Patient Name: Richard [name]      Medical Record Number: [id number]  Date of Birth: [birth year] Sex: Male         Room/Bed: 4W25C/4W25C-01 Payor Info: Payor: [employer] / Plan: [facility] / Product Type: *No Product type* /    Admit Date/Time:  12/06/2023  1:00 PM  Primary Diagnosis:  Right middle cerebral artery stroke [facility])  Hospital Problems: Principal Problem:   Right middle cerebral artery stroke [facility])    Expected Discharge Date: Expected Discharge Date: 01/05/24  Team Members Present: Physician leading conference: Dr. Janeece Mechanic Social Worker Present: Adrianna Albee, LCSW Nurse Present: Forrestine Ike, RN PT Present: Javan Messing, PT OT Present: Florina Husbands, OT SLP Present: Reggie Caper, SLP PPS Coordinator present : Jestine Moron, SLP     Current Status/Progress Goal Weekly Team Focus  Bowel/Bladder   Pt continent x2, Last [facility] 5/13   Maintain regularity   Monitor for any changes in bowel or bladder function. Give medications per order    Swallow/Nutrition/ Hydration   D3/thin   supervision  tolerance of use of strategies, reducing supervision level, education    ADL's   UB dressing min A, LB dressing max A, U/LB bathing mod A, toileting max A; improved sitting balance EOB in preparation for transfers clsoe supervision-CGA with R UE supported when note fatigued; tone in L bicep at midrange and in pectoral muscles   min A overall   L hemibody NMRE< dynamic balance, midline orientation, activity tolerance, BADL retraining, L sie attention, functional cogntiion, transitional movement patterns, Pt education // Barriers: pusher syndrome, L hemiplegia, trunk control, L inattention, L UE functional use    Mobility   Bed mobility = ModA, Transfers = MinA for sit<>stand and squat pivot, MaxA for standing balance, Ambulation = using HR  in hallway and requires MaxA for LLE advancement and block from buckling, and RUE push, no stairs, no w/c mobility   Min/ Mod A overall  Barriers: L hemipareisis, impulsiveness, decreased awareness/ attn /// Work On: NMR for L hemibody, progressing ambulation, sitting/ standing balance with focus on midline orientation and proprioception, motor control of LLE with progressing estim, pt education, will need to setup family educ next week, wife will need a few days to here with pt and therapy staff    Communication   ~90% intelligible at the conversational level given minA   90% at conversational level given supervisionA   education on strategies and use in functional scenarios    Safety/Cognition/ Behavioral Observations  minA   supervision   memory, problem solving, awareness of mistakes, attention    Pain     Knee pain managed with Tylenol  and Voltaren  gel    Pain < 4 with prns     Assess pain q shift and effectiveness of prn meds  Skin     N/a             Discharge Planning:  Home with wife referral made to [facility] for aide assist, will need family training prior to DC a few days. 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  Patient on target to meet rehab goals: yes, currently needs min assist for upper body care and max assist for lower body care with max  assist for toileting. Needs min - mod assist for bed mobility and min assist for transfers. Able to ambulate with max assist+ 2 and max cues to maintain balance. Needs min assist for cognition. Goals for discharge set for min - mod assist overall and supervision for cognition.  *See Care Plan and progress notes for long and short-term goals.   Revisions to Treatment Plan:  Change full supervision for meals to intermittent EMST E-stim to forearm and quad   Teaching Needs: Safety, medications,  transfers, toileting, etc.  Current Barriers to Discharge: Decreased caregiver support and Home enviroment access/layout  Possible Resolutions to Barriers: Family education DME: drop arm BSC, TTB, Stedy     Medical Summary Current Status: hx HIV asymptomatic, severe weakness left side, hx of CHF with reduced exercise tolerance  Barriers to Discharge: Medical stability;Hypotension   Possible Resolutions to [employer]: try to advance to regualr therapy schedule, caregiver training,stopped antihypertensives   Continued Need for Acute Rehabilitation Level of Care: The patient requires daily medical management by a physician with specialized training in physical medicine and rehabilitation for the following reasons: Direction of a multidisciplinary physical rehabilitation program to maximize functional independence : Yes Medical management of patient stability for increased activity during participation in an intensive rehabilitation regime.: Yes Analysis of laboratory values and/or radiology reports with any subsequent need for medication adjustment and/or medical intervention. : Yes   I attest that I was present, lead the team conference, and concur with the assessment and plan of the team.   Forrestine Ike B 12/22/2023, 8:09 AM

## 2023-12-22 NOTE — Progress Notes (Signed)
 Speech Language Pathology Daily Session Note  Patient Details  Name: Richard [name] MRN: [id number] Date of Birth: [birth year]  Today's Date: 12/22/2023 SLP Individual Time: 4098-1191 4782-9562 SLP Individual Time Calculation (min): 44 min 30 minutes   Short Term Goals: Week 3: SLP Short Term Goal 1 (Week 3): Patient will increase speech intelligibility to 90% with use of compensatory strategies given supervision multimodal A SLP Short Term Goal 2 (Week 3): Patient will recall and utilize memory compensatory strategies given supervision multimodal A SLP Short Term Goal 3 (Week 3): Patient will demonstrate problem solving skills during mildly complex functional situations given sup  multimodal A SLP Short Term Goal 4 (Week 3): Patient will utilize compensatory strategies during consumption of least restrictive diet with sup multimodal A  Skilled Therapeutic Interventions: Session 1: Skilled therapy session focused on cognitive and dysphagia goals. Upon entrance, patient consuming D3/thin liquid breakfast. Patient with mild oral residuals on L side, though cleared with supervision A. Patient continues to present with intermittent throat clears throughout PO intake and had x1 episode of coughing after large thin liquid bolus. SLP educated patient on importance of taking small bites/sips to reduce aspiration risk. Continue current diet and intermittent supervision to cue for compensatory strategies. SLP targeted cognitive goals through simple addition task in functional scenarios. Patient required minA to complete problem solving task and attend to L side throughout session. Patient left in bed with alarm set and call bell in reach. Continue POC.   Session 2: Skilled therapy session focused on communication goals. SLP facilitated session by providing mod verbal A for patient to complete IOPI exercises ([place]  Oral Performance Instrument) Patient completed x20 repetitions of 15 anterior kpa and x20  repetitions of posterior kpa set at 10. SLP continued to target communication goals through complex sentence task. Patient utilized speech intelligibility strategies to reach 90% intelligibility given supervisionA. Patient left in chair with alarm set and call bell in reach. Continue POC.   Pain Session 1: Denies Session 2: Bottom pain, SLP repositioned  Therapy/Group: Individual Therapy  Seamus Warehime M.A., CCC-SLP 12/22/2023, 7:46 AM

## 2023-12-22 NOTE — Progress Notes (Signed)
 Occupational Therapy Session Note  Patient Details  Name: Richard [name] MRN: [id number] Date of Birth: 01/26/1956  Today's Date: 12/22/2023 OT Individual Time: 2956-2130 OT Individual Time Calculation (min): 58 min    Short Term Goals: Week 2:  OT Short Term Goal 1 (Week 2): Pt will complete toilet transfers with max A x1 OT Short Term Goal 1 - Progress (Week 2): Met OT Short Term Goal 2 (Week 2): Pt will maintain L UE in safe position when sitting up in wc with min verbal cues OT Short Term Goal 2 - Progress (Week 2): Met OT Short Term Goal 3 (Week 2): Pt will complete squat pivot transfers with max A x1 OT Short Term Goal 3 - Progress (Week 2): Met OT Short Term Goal 4 (Week 2): Pt will maintain sitting balance in preparation for functional transfers with CGA consistently OT Short Term Goal 4 - Progress (Week 2): Met  Skilled Therapeutic Interventions/Progress Updates:     Pt received lightly sleeping in bed, waking upon OT arrival. Pt presenting to be tired, however in good spirits receptive to skilled OT session reporting 0/10 pain- OT offering intermittent rest breaks, repositioning, and therapeutic support to optimize participation in therapy session. Pt requesting to take shower this session- focused this session on dynamic sitting balance, L side attention, functional movement patterns, and BADL retraining within context of shower level ADLs.   Pt transitioned to EOB with light min A +increased time and max verbal cues for motivation and for technique for bringing B LEs off EOB- Pt with improved carryover of using R UE to push into bed to lift trunk with min A required to fully come into sitting position.   Utilized STEDY for functional transfer to TTB positioned in walk-in shower and TISWC to facilitate increased opportunities to work on sit<>stands and standing balance. Pt able to complete sit<>stands with min-mod A overall with R UE supported on STEDY grab bar and L UE  supported in sling- verbal cues required for midline orientation and to facilitate lateral weight shift to R. Perched sitting balance maintained with CGA through session.   U/LB bathing completed seated on TTB. Worked on incorporating R UE into functional task for increased sitting balance challenge- Pt able to intermittently maintain sitting balance with supervision, however when fatigued or distracted, Pt would begin to fall to L requiring up to mod A to correct. Education provided on long-handled sponge use with Pt demonstrating teach back as evidence of learning. Bathing tasks completed with mod A overall +increased time for rest breaks.   Following shower, U/LB dressing completed seated in TISWC. Max A required to weave L LE with Pt then able to lift R LE to weave into pants. Pt stood with mod A +2 min A and maintained standing balance with mod A while +2 assisted with bringing pants to waist. Pt able to weave L UE into shirt with min A and then finish UB dressing task with supervision.   Applied K-tape to Pt's L deltoid and scapula to support improved proprioceptive feedback to decrease pain and improve muscle activation. Pt fatigued at end of session, however receptive to staying up in [facility]. Pt was left resting in TISWC with call bell in reach, seatbelt alarm on, and all needs met.    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

## 2023-12-23 ENCOUNTER — Encounter: Payer: Self-pay | Admitting: General Practice

## 2023-12-23 DIAGNOSIS — I5022 Chronic systolic (congestive) heart failure: Secondary | ICD-10-CM

## 2023-12-23 DIAGNOSIS — K5900 Constipation, unspecified: Secondary | ICD-10-CM

## 2023-12-23 DIAGNOSIS — R519 Headache, unspecified: Secondary | ICD-10-CM

## 2023-12-23 NOTE — Progress Notes (Signed)
 Speech Language Pathology Daily Session Note  Patient Details  Name: Richard [name] MRN: [id number] Date of Birth: Mar 27, 1956  Today's Date: 12/23/2023 SLP Individual Time: 1445-1530 SLP Individual Time Calculation (min): 45 min  Short Term Goals: Week 3: SLP Short Term Goal 1 (Week 3): Patient will increase speech intelligibility to 90% with use of compensatory strategies given supervision multimodal A SLP Short Term Goal 2 (Week 3): Patient will recall and utilize memory compensatory strategies given supervision multimodal A SLP Short Term Goal 3 (Week 3): Patient will demonstrate problem solving skills during mildly complex functional situations given sup  multimodal A SLP Short Term Goal 4 (Week 3): Patient will utilize compensatory strategies during consumption of least restrictive diet with sup multimodal A  Skilled Therapeutic Interventions: Skilled therapy session focused on  dysphagia and communication goals.  SLP observed patient with thin liquids via cup. Patient required x1 verbal cue to take small, slow sips. Patient with no s/sx of aspiration this date. Continue thin liquids via cup and no straws. Patient upgraded to intermittent supervision during meals. SLP targeted communication goals through use of IOPI ([place]  Oral Performance Instrument) to assess lingual strength. Patient completed x20 anterior repetitions at 15kpa and x20 posterior repetitions at 12 kpa. Furthermore, SLP reassessed respiratory strength through evaluating maximim expiratory pressure. Patient with an average MEP of 25 cm H2O this date. Of note, patient reports being extremely fatigued, requiring increased encouragement to participate in todays session. At the end of the session, patient independently recalled 4/4 speech intelligibility strategies. Patient left in bed with alarm set and call bell in reach. Continue POC.    Pain None reported   Therapy/Group: Individual Therapy  Aminata Buffalo M.A.,  CCC-SLP 12/23/2023, 7:48 AM

## 2023-12-23 NOTE — Progress Notes (Signed)
 Physical Therapy Weekly Progress Note  Patient Details  Name: Richard [name] MRN: [id number] Date of Birth: 01/29/1956  Beginning of progress report period: Dec 16, 2023 End of progress report period: Dec 24, 2023  Today's Date: 12/24/2023  Patient has met 3 of 5 short term goals.  Pt making slow but appropriate progress towards goals and is on track to meet LTG. He has not missed therapy and is motivated to continue to improve. He completes bed mobility with MinA, sit<>stand transfers with Min/ ModA to steady hand hold, and stand<>pivot transfers with MaxA for balance. Squat pivot transfers performed with CGA to R and Min/ ModA to L. He's ambulating up to 75 ft using 3-Musketeer support with Min/ modA +2. He has not yet progressed to stair navigation. This week was able to demo ability to propel w/c with supervision. Continues to require MaxA for all w/c parts. He continues to be primarily limited by decreased dynamic standing balance, L hemibody sensory deficits, LUE > LLE weakness, and gait impairments. Caregiver/ family have not yet participated in family education to prepare for d/c home.   Patient continues to demonstrate the following deficits muscle weakness, decreased cardiorespiratoy endurance, impaired timing and sequencing, unbalanced muscle activation, and decreased coordination, decreased midline orientation, decreased attention to left, and decreased motor planning, decreased attention, decreased awareness, and decreased safety awareness, and decreased standing balance, decreased postural control, hemiplegia, and decreased balance strategies and therefore will continue to benefit from skilled PT intervention to increase functional independence with mobility.  Patient progressing toward long term goals..  Continue plan of care.  PT Short Term Goals Week 2:  PT Short Term Goal 1 (Week 2): Pt will perform bed mobility with MinA. PT Short Term Goal 1 - Progress (Week 2): Progressing  toward goal PT Short Term Goal 2 (Week 2): Pt will perform squat pivot transfers with MinA. PT Short Term Goal 2 - Progress (Week 2): Progressing toward goal PT Short Term Goal 3 (Week 2): Pt will perform sit<>stand transfers with ModA. PT Short Term Goal 3 - Progress (Week 2): Met PT Short Term Goal 4 (Week 2): Pt will ambulate at least 30 ft using LRAD with ModA +2. PT Short Term Goal 4 - Progress (Week 2): Met PT Short Term Goal 5 (Week 2): Pt will initiate w/c mobility training. PT Short Term Goal 5 - Progress (Week 2): Met Week 3:  PT Short Term Goal 1 (Week 3): Pt will perform bed mobility with CGA/ MinA. PT Short Term Goal 2 (Week 3): Pt will perform squat pivot transfers with CGA. PT Short Term Goal 3 (Week 3): Pt will perform sit<>stand transfers with MinA. PT Short Term Goal 4 (Week 3): Pt will ambulate at least 30 ft using LRAD with MinA +2. PT Short Term Goal 5 (Week 3): Pt will initiate stair training.  Skilled Therapeutic Interventions/Progress Updates:  Ambulation/gait training;Cognitive remediation/compensation;Discharge planning;DME/adaptive equipment instruction;Functional mobility training;Pain management;Psychosocial support;Splinting/orthotics;Therapeutic Activities;UE/LE Strength taining/ROM;Visual/perceptual remediation/compensation;Wheelchair propulsion/positioning;UE/LE Coordination activities;Therapeutic Exercise;Stair training;Skin care/wound management;Neuromuscular re-education;Patient/family education;Functional electrical stimulation;Disease management/prevention;Community reintegration;Balance/vestibular training   Therapy Documentation Precautions:  Precautions Precautions: Fall, Other (comment) Precaution/Restrictions Comments: L hemiplegia with flaccidity, 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   Therapy/Group: Individual Therapy  Donne Gage PT, DPT, CSRS 12/23/2023, 6:04 PM

## 2023-12-23 NOTE — Progress Notes (Addendum)
 Physical Therapy Session Note  Patient Details  Name: Richard [name] MRN: [id number] Date of Birth: 08-29-1955  Today's Date: 12/23/2023 PT Individual Time: 0902-1002 PT Individual Time Calculation (min): 60 min   Short Term Goals: Week 1:  PT Short Term Goal 1 (Week 1): Pt will perform bed mobility with ModA +2. PT Short Term Goal 1 - Progress (Week 1): Met PT Short Term Goal 2 (Week 1): Pt will maintain seated balance with decreased push of RUE and increased trunk activation to maintain upright position with overall ModA for up to . PT Short Term Goal 2 - Progress (Week 1): Met PT Short Term Goal 3 (Week 1): Pt will perform sit<>stand using STEDY with MinA. PT Short Term Goal 3 - Progress (Week 1): Met PT Short Term Goal 4 (Week 1): Pt will initiate gait training PT Short Term Goal 4 - Progress (Week 1): Met Week 2:  PT Short Term Goal 1 (Week 2): Pt will perform bed mobility with MinA. PT Short Term Goal 2 (Week 2): Pt will perform squat pivot transfers with MinA. PT Short Term Goal 3 (Week 2): Pt will perform sit<>stand transfers with ModA. PT Short Term Goal 4 (Week 2): Pt will ambulate at least 30 ft using LRAD with ModA +2. PT Short Term Goal 5 (Week 2): Pt will initiate w/c mobility training.   Skilled Therapeutic Interventions/Progress Updates:  Patient supine in bed on entrance to room. Patient alert and agreeable to PT session. Sister and BIL present in room.  Patient with no pain complaint at start of session.  Therapeutic Activity/ NMR: Bed Mobility: Pt not yet dressed for day and guided in supine dressing. He is able to perform rolling with MinA to L and requires extensive cues and MaxA to complete to R. While in supine, pt assisted in placing LLE in hooklying position and hold given to L ankle while pt performs bridging to complete donning of pants. Pt then able to perform supine > sit with overall ModA but good in following instructions. VC/ tc required for  technique throughout.Once seated, guided pt in seated reach for balance and midline retraining. Loses balance to L side and requires MinA to return to midline. Is able to reach midline but unable to hold position with introduction of increased distance of reach, especially to L side.  Transfers: Pt performed sit<>stand transfers throughout session with MinA in rise to stand but then requires Mod/ MaxA to maintain upright stance. Otherwise pt continues to perform lean and intermittent push to L side. Provided vc/ tc fortechnique as pt continues to require intermittent vc for forward lean in order to initiate rise to stand.  Pt relating that he will be able to drive his truck on discharge. Reminded pt that he will not be able to drive and will have to be able to get in/ out of wife's car on passenger side. Pt relates ability to pull on handle near roof in order to assist with getting into car. Reminded pt that he will require L side motor control to reach to handle as well as to drive safely again. Pt willing to attempt transfer into practice car. Sister relates height of seat and car set to this height. Once positioned outside of vehicle cued pt to demo how to get in car - allowed to fail prior to providing education on positioning and use of R hemibody. Pt requires vc to sequence entire squat pivot into car using only available handholds such as w/c  seat and armrest, then using "handle" to complete positioning on seat prior to turning to bring LE into footwell. Same for exit. Requires max cues and MinA to complete.   Gait Training:  Pt ambulated 40' x2 using 3 musketeer technique with arm support from +2 and MaxA from therapist. Pt is improving slightly in holding knee extension in stance phase without guard for buckle. Demonstrated minimal but evident initiation of hip flexion with vc but then requires significant assist inexecution of LLR swing phase. Requires assist in foot clearance and advancing/  positioning of L foot. Provided vc/ tc and facilitation for sequencing lateral weight shift especially to RLE as well as forward L hip translation in order to asssit with LLE advancement.  Wheelchair Mobility:  Pt propelled wheelchair 150' x1 with supervision. Uses RLE only and is able to complete with no physical assist including 90* turns.  NMR performed for improvements in motor control and coordination, balance, sequencing, judgement, and self confidence/ efficacy in performing all aspects of mobility at highest level of independence.   Patient seated upright in mwc at end of session with brakes locked, belt alarm set, and all needs within reach. Sister and BIL present to provide supervision and OT session to start in one hour.    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    Therapy/Group: Individual Therapy  Donne Gage PT, DPT, CSRS 12/23/2023, 9:10 AM

## 2023-12-23 NOTE — Progress Notes (Signed)
 Occupational Therapy Session Note  Patient Details  Name: HOBY FOLLEN MRN: [id number] Date of Birth: [birth year]  Today's Date: 12/23/2023 OT Individual Time: 1015-1100 OT Individual Time Calculation (min): 45 min    Short Term Goals: Week 3:  OT Short Term Goal 1 (Week 3): Pt will maintain sitting balance in preparation for functional transfers with close supervision consistently OT Short Term Goal 2 (Week 3): Pt will complete one step of lower body dressing OT Short Term Goal 3 (Week 3): Pt will complete toilet transfers with mod A using LRAD  Skilled Therapeutic Interventions/Progress Updates: Patient received sitting up in w/c with family present. Patient assisted to therapy gym by w/c. Patient educated on safe squat pivot transfer to therapy mat. Patient able to perform squat pivot following set up with Mod assist to control strength of movement. Patient able to push with RUE on arm rest but used more force than necessary requiring the therapist to control left movement. Followed with 3 scoots to center of mat with improved control and only CGA needed. Patient seen for NMRE of the LUE working on achieving pain free ROM into IR/ER working towards full shoulder flexion. Patient with initial tightness greater than 100 degrees shoulder flexion, but improved pain free range with mirroring movements on the right side and moderate facilitation into full thoracic extension. Patient's family reports patient did not have great posture prior and patient was encouraged to work on scapular adduction depression when in the room with family. Followed mobilization with pre-reach wt bearing facilitating a forward push against a tipped chair. Patient needed Max assist for full triceps activation/ elbow extension, but only required min assist for controlled eccentric contraction lowering the chair back towards the mat bending at the elbow. Patient with good participation with UE NMRE. Patient reclined 2 times  during session with therapist assist to rest. Concluded treatment with squat pivot transfer to the right side. Encouraged to repeat the steps with therapist and look at foot and ankle position to ensure he does not risk twisting or rolling ankle.  CGA provided moving towards his stronger side. Patient taken back to his room, chair alarm in place, call bell in reach and family present. Continue with skilled OT POC.     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

## 2023-12-23 NOTE — Progress Notes (Signed)
 Physical Therapy Session Note  Patient Details  Name: Richard [name] MRN: [id number] Date of Birth: 10-Mar-1956  Today's Date: 12/23/2023 PT Individual Time: 1120-1204 PT Individual Time Calculation (min): 44 min   Short Term Goals: Week 1:  PT Short Term Goal 1 (Week 1): Pt will perform bed mobility with ModA +2. PT Short Term Goal 1 - Progress (Week 1): Met PT Short Term Goal 2 (Week 1): Pt will maintain seated balance with decreased push of RUE and increased trunk activation to maintain upright position with overall ModA for up to . PT Short Term Goal 2 - Progress (Week 1): Met PT Short Term Goal 3 (Week 1): Pt will perform sit<>stand using STEDY with MinA. PT Short Term Goal 3 - Progress (Week 1): Met PT Short Term Goal 4 (Week 1): Pt will initiate gait training PT Short Term Goal 4 - Progress (Week 1): Met Week 2:  PT Short Term Goal 1 (Week 2): Pt will perform bed mobility with MinA. PT Short Term Goal 2 (Week 2): Pt will perform squat pivot transfers with MinA. PT Short Term Goal 3 (Week 2): Pt will perform sit<>stand transfers with ModA. PT Short Term Goal 4 (Week 2): Pt will ambulate at least 30 ft using LRAD with ModA +2. PT Short Term Goal 5 (Week 2): Pt will initiate w/c mobility training.  Skilled Therapeutic Interventions/Progress Updates:  Patient seated upright in w/c on entrance to room. Patient alert and agreeable to PT session.   Patient with no pain complaint at start of session.  Therapeutic Activity/ NMR: Transfers: Pt performed squat pivot transfers transfers throughout session with focus on safety awareness and R/L sided awareness. Requires vc throughout for sequencing/ technique. Demos difficulty with straight  forward lean to initiate forward lean. Provided vc/ tc for technique throughout.  NMR performed for improvements in motor control and coordination, balance, sequencing, judgement, and self confidence/ efficacy in performing all aspects of  mobility at highest level of independence.   Gait Training:  Pt ambulated 48' x1/ 52' x1 using 3 musketeer technique with overall ModA for maintaining upright stance, increasing weight shift to R side, facilitation of Bil forward hip translation and timing of L quad activation. Provided vc/ tc throughout for sequencing of above technique.  Wheelchair Mobility:  Pt propelled wheelchair >150 feet with supervision. Discussion with wife re: potential need for custom w/c d/t need for lightweight w/c as well as proper fit d/t pt's tall stature and longer leg length.   Patient seated upright in TIS w/c at end of session with brakes locked, belt alarm set, and all needs within reach.   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    Therapy/Group: Individual Therapy  Donne Gage PT, DPT, CSRS 12/23/2023, 6:05 PM

## 2023-12-23 NOTE — Progress Notes (Signed)
 PROGRESS NOTE   Subjective/Complaints:  No new complaints or concerns. Reports BM today. Reports sleeping ok. New  new concerns.  Denies pain.   ROS:l  Pt denies Fever, SOB, abd pain, CP, N/V/C/D, and vision changes Denies constipation   Objective:   No results found.  No results for input(s): "WBC", "HGB", "HCT", "PLT" in the last 72 hours.   No results for input(s): "NA", "K", "CL", "CO2", "GLUCOSE", "BUN", "CREATININE", "CALCIUM " in the last 72 hours.     Intake/Output Summary (Last 24 hours) at 12/23/2023 1343 Last data filed at 12/23/2023 1312 Gross per 24 hour  Intake 836 ml  Output 325 ml  Net 511 ml        Physical Exam: Vital Signs Blood pressure 105/84, pulse 84, temperature 98.9 F (37.2 C), temperature source Oral, resp. rate 16, height 5\' 10"  (1.778 m), weight 77.5 kg, SpO2 99%.   General: No acute distress, appears comfortable laying in bed  Mood and affect are appropriate Heart: Regular rate and rhythm no rubs murmurs or extra sounds Lungs: Clear to auscultation, breathing unlabored, no rales or wheezes Abdomen: Positive bowel sounds, soft nontender to palpation, nondistended Extremities: No clubbing, cyanosis, or edema   Skin: No evidence of breakdown, no evidence of rash Neurologic: awake and alert . Answers questions. Neurological: Ox3- delayed responses, but appropriatemotor strength is 5/5 in right and 0/5 left  deltoid, bicep, tricep, trace grip, hip flexor, knee extensors, ankle dorsiflexor and plantar flexor.  Clonus at the left ankle  Trace Left hip/knee ext synergy  Sensory exam normal sensation to light touch right side and LLE absent LT LUE but feels pinch  -Unchanged 5/16 Cerebellar exam unable to perform on left due to weakness  Musculoskeletal: Full range of motion in all 4 extremities. No joint swelling  Assessment/Plan: 1. Functional deficits which require 3+ hours per day  of interdisciplinary therapy in a comprehensive inpatient rehab setting. Physiatrist is providing close team supervision and 24 hour management of active medical problems listed below. Physiatrist and rehab team continue to assess barriers to discharge/monitor patient progress toward functional and medical goals  Care Tool:  Bathing    Body parts bathed by patient: Left arm, Chest, Abdomen, Front perineal area, Buttocks, Right upper leg, Left upper leg, Face, Right lower leg, Left lower leg   Body parts bathed by helper: Right arm, Right lower leg, Left lower leg     Bathing assist Assist Level: Moderate Assistance - Patient 50 - 74%     Upper Body Dressing/Undressing Upper body dressing   What is the patient wearing?: Pull over shirt    Upper body assist Assist Level: Minimal Assistance - Patient > 75%    Lower Body Dressing/Undressing Lower body dressing      What is the patient wearing?: Pants, Underwear/pull up     Lower body assist Assist for lower body dressing: Maximal Assistance - Patient 25 - 49%     Toileting Toileting    Toileting assist Assist for toileting: Total Assistance - Patient < 25%     Transfers Chair/bed transfer  Transfers assist  Chair/bed transfer activity did not occur: Safety/medical concerns  Chair/bed transfer assist  level: Dependent - mechanical lift (stedy)     Locomotion Ambulation   Ambulation assist   Ambulation activity did not occur: Safety/medical concerns          Walk 10 feet activity   Assist  Walk 10 feet activity did not occur: Safety/medical concerns        Walk 50 feet activity   Assist Walk 50 feet with 2 turns activity did not occur: Safety/medical concerns         Walk 150 feet activity   Assist Walk 150 feet activity did not occur: Safety/medical concerns         Walk 10 feet on uneven surface  activity   Assist Walk 10 feet on uneven surfaces activity did not occur:  Safety/medical concerns         Wheelchair     Assist Is the patient using a wheelchair?: Yes Type of Wheelchair: Manual    Wheelchair assist level: Dependent - Patient 0%      Wheelchair 50 feet with 2 turns activity    Assist        Assist Level: Dependent - Patient 0%   Wheelchair 150 feet activity     Assist      Assist Level: Dependent - Patient 0%   Blood pressure 105/84, pulse 84, temperature 98.9 F (37.2 C), temperature source Oral, resp. rate 16, height 5\' 10"  (1.778 m), weight 77.5 kg, SpO2 99%.  Medical Problem List and Plan: 1. 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             -patient may  shower             -ELOS/Goals: 5/29 days Min A  Con't CIR,    PRAFO and WHO for LLE and LUE  -Exp DC 5/29 2.  Antithrombotics: -DVT/anticoagulation: Eliquis              -antiplatelet therapy: ASA 81mg  daily 3. Pain Management/spasticity: Tylenol  as needed Post CVA HA resolved   5/11- Will add topamax 50 mg at bedtime 5/15 reduce topamax to 25mg  and monitor   -appears more alert then described yesterday 5/16, continue current  Bilateral knee pain , aching in am s- OA noted on Xrays in 2023 start diclofenac  gel   -emerging left sided tone.    -5/4 unable to tolerate even low dose baclofen --> stopped    -outpt botox candidate 4. Mood/Behavior/Sleep: Provide emotional support             -antipsychotic agents: N/A 5. Neuropsych/cognition: This patient is ?capable of making decisions on his own behalf. 6. Skin/Wound Care: Routine skin checks 7. Fluids/Electrolytes/Nutrition: Routine in and outs with follow-up chemistries Intake of fluids improved to yesterday  8.  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  Followed by heart failure team.  Monitor for any signs of fluid overload.   Toprol  XL 12.5 mg daily.  Monitor for any hypotension Vitals:   12/23/23 0413 12/23/23 1314  BP: 103/72 105/84  Pulse: 80 84  Resp: 17 16  Temp: 99.2 F (37.3 C) 98.9 F (37.2 C)  SpO2: 100% 99%    BP still on low side, off metoprolol ,HR mild brady to normal  [facility] Weights   12/21/23 0432 12/22/23 0600 12/23/23 0413  Weight: 72.1 kg 72 kg 77.5 kg   Weight coming down   5/16 NO signs of overload,  suspect wt today not accurate 9.  CAD/MI/CABG.  Follow-up cardiology services.  Currently maintained on aspirin  and Plavix  10.  VT.  Last episode noted 2023 with successful ATP. 11.  CKD stage IIIA=  Baseline creatinine 1.3-1.5.  Follow-up chemistries,stable  Creat  5/15 stable    Latest Ref Rng & Units 12/19/2023    5:23 AM 12/13/2023    5:08 AM 12/08/2023    5:49 AM  BMP  Glucose 70 - 99 mg/dL 87     BUN 8 - 23 mg/dL 17     Creatinine 5.62 - 1.24 mg/dL 1.30  8.65  7.84   Sodium 135 - 145 mmol/L 139     Potassium 3.5 - 5.1 mmol/L 4.0     Chloride 98 - 111 mmol/L 108     CO2 22 - 32 mmol/L 21     Calcium  8.9 - 10.3 mg/dL 8.9       12.  HIV.  Continue Dovato - followed at the [facility] 13.  Hyperlipidemia.  Lipitor/Zetia  14.  GERD.  Pepcid  15.  Constipation.  MiraLAX  daily, Senokot changed to 2 po BID    LBM 5/16 16.  Dysphagia.  Patient notes coughing on current diet of mechanical soft.  Dysphagia 2 thin liquids and have speech therapy follow-up.   MBS and FEES looked ok  17. Daily HA's since stroke- tylenol  + topamax started 5/11, improved- try reducing dose to 25mg  and monitor   -5/16 HA controlled, continue current  LOS: 17 days A FACE TO FACE EVALUATION WAS PERFORMED  Lylia Sand 12/23/2023, 1:43 PM

## 2023-12-24 DIAGNOSIS — M79652 Pain in left thigh: Secondary | ICD-10-CM

## 2023-12-24 NOTE — Progress Notes (Signed)
 PROGRESS NOTE   Subjective/Complaints:  Patient reports some mild pain in his left thigh, resolved shortly afterwards.  Denies muscle spasms in this area.  No additional concerns.  ROS:l  Pt denies Fever, SOB, abd pain, CP, N/V/C/D, and vision changes Denies constipation  + Left thigh pain-resolved  Objective:   No results found.  No results for input(s): "WBC", "HGB", "HCT", "PLT" in the last 72 hours.   No results for input(s): "NA", "K", "CL", "CO2", "GLUCOSE", "BUN", "CREATININE", "CALCIUM " in the last 72 hours.     Intake/Output Summary (Last 24 hours) at 12/24/2023 1434 Last data filed at 12/24/2023 1211 Gross per 24 hour  Intake 826 ml  Output 401 ml  Net 425 ml        Physical Exam: Vital Signs Blood pressure (!) 101/58, pulse 70, temperature 97.9 F (36.6 C), temperature source Oral, resp. rate 18, height 5\' 10"  (1.778 m), weight 72.4 kg, SpO2 99%.   General: No acute distress, appears comfortable laying in bed  Mood appears a little flat, patient is pleasant overall Heart: Regular rate and rhythm no rubs murmurs or extra sounds Lungs: Clear to auscultation, breathing unlabored, no rales or wheezes Abdomen: Positive bowel sounds, soft nontender to palpation, nondistended Extremities: No clubbing, cyanosis, or edema   Skin: No evidence of breakdown, no evidence of rash Neurologic: awake and alert . Answers questions. Neurological: Ox3- delayed responses, but appropriatemotor strength is 5/5 in right and 0/5 left  deltoid, bicep, tricep, trace grip, hip flexor, knee extensors, ankle dorsiflexor and plantar flexor.  Clonus at the left ankle  Trace Left hip/knee ext synergy  Sensory exam normal sensation to light touch right side and LLE absent LT LUE but feels pinch  -Unchanged 5/17 Cerebellar exam unable to perform on left due to weakness  Musculoskeletal: Full range of motion in all 4 extremities. No  joint swelling No significant TTP left thigh, no significant pain noted with left hip or knee ROM  Assessment/Plan: 1. Functional deficits which require 3+ hours per day of interdisciplinary therapy in a comprehensive inpatient rehab setting. Physiatrist is providing close team supervision and 24 hour management of active medical problems listed below. Physiatrist and rehab team continue to assess barriers to discharge/monitor patient progress toward functional and medical goals  Care Tool:  Bathing    Body parts bathed by patient: Left arm, Chest, Abdomen, Front perineal area, Buttocks, Right upper leg, Left upper leg, Face, Right lower leg, Left lower leg   Body parts bathed by helper: Right arm, Right lower leg, Left lower leg     Bathing assist Assist Level: Moderate Assistance - Patient 50 - 74%     Upper Body Dressing/Undressing Upper body dressing   What is the patient wearing?: Pull over shirt    Upper body assist Assist Level: Minimal Assistance - Patient > 75%    Lower Body Dressing/Undressing Lower body dressing      What is the patient wearing?: Pants, Underwear/pull up     Lower body assist Assist for lower body dressing: Maximal Assistance - Patient 25 - 49%     Toileting Toileting    Toileting assist Assist for toileting: Total  Assistance - Patient < 25%     Transfers Chair/bed transfer  Transfers assist  Chair/bed transfer activity did not occur: Safety/medical concerns  Chair/bed transfer assist level: Dependent - mechanical lift (stedy)     Locomotion Ambulation   Ambulation assist   Ambulation activity did not occur: Safety/medical concerns          Walk 10 feet activity   Assist  Walk 10 feet activity did not occur: Safety/medical concerns        Walk 50 feet activity   Assist Walk 50 feet with 2 turns activity did not occur: Safety/medical concerns         Walk 150 feet activity   Assist Walk 150 feet activity  did not occur: Safety/medical concerns         Walk 10 feet on uneven surface  activity   Assist Walk 10 feet on uneven surfaces activity did not occur: Safety/medical concerns         Wheelchair     Assist Is the patient using a wheelchair?: Yes Type of Wheelchair: Manual    Wheelchair assist level: Dependent - Patient 0%      Wheelchair 50 feet with 2 turns activity    Assist        Assist Level: Dependent - Patient 0%   Wheelchair 150 feet activity     Assist      Assist Level: Dependent - Patient 0%   Blood pressure (!) 101/58, pulse 70, temperature 97.9 F (36.6 C), temperature source Oral, resp. rate 18, height 5\' 10"  (1.778 m), weight 72.4 kg, SpO2 99%.  Medical Problem List and Plan: 1. 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             -patient may  shower             -ELOS/Goals: 5/29 days Min A  Con't CIR,    PRAFO and WHO for LLE and LUE  -Exp DC 5/29 2.  Antithrombotics: -DVT/anticoagulation: Eliquis              -antiplatelet therapy: ASA 81mg  daily 3. Pain Management/spasticity: Tylenol  as needed Post CVA HA resolved   5/11- Will add topamax  50 mg at bedtime 5/15 reduce topamax  to 25mg  and monitor  Bilateral knee pain , aching in am s- OA noted on Xrays in 2023 start diclofenac  gel   -emerging left sided tone.    -5/4 unable to tolerate even low dose baclofen --> stopped    -outpt botox candidate - Patient appears alert again today 5/17, continue current regimen.  Patient had a brief episode of left thigh pain-has resolved.  Discussed he can try Tylenol  if reoccurs. 4. Mood/Behavior/Sleep: Provide emotional support             -antipsychotic agents: N/A 5. Neuropsych/cognition: This patient is ?capable of making decisions on his own behalf. 6. Skin/Wound Care: Routine skin checks 7. Fluids/Electrolytes/Nutrition:  Routine in and outs with follow-up chemistries Intake of fluids improved to yesterday  8.  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  Followed by heart failure team.  Monitor for any signs of fluid overload.  Toprol  XL 12.5 mg daily.  Monitor for any hypotension Vitals:   12/24/23 0341 12/24/23 1410  BP: 99/62 (!) 101/58  Pulse: 68 70  Resp: 18 18  Temp: 97.9 F (36.6 C) 97.9 F (36.6 C)  SpO2: 99% 99%    BP still on low side, off metoprolol ,HR mild brady to normal  [employer]   12/22/23 0600 12/23/23 0413 12/24/23 0501  Weight: 72 kg 77.5 kg 72.4 kg   Weight coming down   5/16 NO signs of overload, suspect wt today not accurate  5/17 weight stable today 9.  CAD/MI/CABG.  Follow-up cardiology services.  Currently maintained on aspirin  and Plavix  10.  VT.  Last episode noted 2023 with successful ATP. 11.  CKD stage IIIA=  Baseline creatinine 1.3-1.5.  Follow-up chemistries,stable  Creat  5/15 stable    Latest Ref Rng & Units 12/19/2023    5:23 AM 12/13/2023    5:08 AM 12/08/2023    5:49 AM  BMP  Glucose 70 - 99 mg/dL 87     BUN 8 - 23 mg/dL 17     Creatinine 8.29 - 1.24 mg/dL 5.62  1.30  8.65   Sodium 135 - 145 mmol/L 139     Potassium 3.5 - 5.1 mmol/L 4.0     Chloride 98 - 111 mmol/L 108     CO2 22 - 32 mmol/L 21     Calcium  8.9 - 10.3 mg/dL 8.9       12.  HIV.  Continue Dovato - followed at the [facility] 13.  Hyperlipidemia.  Lipitor/Zetia  14.  GERD.  Pepcid  15.  Constipation.  MiraLAX  daily, Senokot changed to 2 po BID    LBM 5/17-continue to monitor 16.  Dysphagia.  Patient notes coughing on current diet of mechanical soft.  Dysphagia 2 thin liquids and have speech therapy follow-up.   MBS and FEES looked ok  17. Daily HA's since stroke- tylenol  + topamax  started 5/11, improved- try reducing dose to 25mg  and monitor   -5/16-7 HA controlled, continue current  LOS: 18 days A FACE TO FACE EVALUATION WAS  PERFORMED  Lylia Sand 12/24/2023, 2:34 PM

## 2023-12-25 ENCOUNTER — Inpatient Hospital Stay (HOSPITAL_COMMUNITY)

## 2023-12-25 DIAGNOSIS — D649 Anemia, unspecified: Secondary | ICD-10-CM

## 2023-12-25 NOTE — Progress Notes (Signed)
 PROGRESS NOTE   Subjective/Complaints:  Patient reports chronic lower back pain, Tylenol  helping to control this.  Reports some mild intermittent abdominal pain for few days.  Denies constipation reports he had a bowel movement yesterday. ROS:l  Pt denies Fever, SOB, CP, N/V/C/D, and vision changes Denies constipation  + Left thigh pain-resolved  Objective:   No results found.  No results for input(s): "WBC", "HGB", "HCT", "PLT" in the last 72 hours.   No results for input(s): "NA", "K", "CL", "CO2", "GLUCOSE", "BUN", "CREATININE", "CALCIUM " in the last 72 hours.     Intake/Output Summary (Last 24 hours) at 12/25/2023 1342 Last data filed at 12/25/2023 1237 Gross per 24 hour  Intake 954 ml  Output 400 ml  Net 554 ml        Physical Exam: Vital Signs Blood pressure 106/69, pulse 68, temperature 98 F (36.7 C), temperature source Oral, resp. rate 17, height 5\' 10"  (1.778 m), weight 72.3 kg, SpO2 100%.   General: No acute distress, appears comfortable laying in bed  Mood appears a little flat Heart: Regular rate and rhythm no rubs murmurs or extra sounds Lungs: Clear to auscultation, breathing unlabored, no rales or wheezes Abdomen: Positive bowel sounds, soft, mildly tender to palpation, nondistended Extremities: No clubbing, cyanosis, or edema   Skin: No evidence of breakdown, no evidence of rash Neurologic: awake and alert . Answers questions. Neurological: Ox3- delayed responses, but appropriatemotor strength is 5/5 in right and 0/5 left  deltoid, bicep, tricep, trace grip, hip flexor, knee extensors, ankle dorsiflexor and plantar flexor.  Clonus at the left ankle  Trace Left hip/knee ext synergy  Sensory exam normal sensation to light touch right side and LLE absent LT LUE but feels pinch  -Unchanged 5/18 Cerebellar exam unable to perform on left due to weakness  Musculoskeletal: Full range of motion in all  4 extremities. No joint swelling No significant TTP left thigh, no significant pain noted with left hip or knee ROM  Assessment/Plan: 1. Functional deficits which require 3+ hours per day of interdisciplinary therapy in a comprehensive inpatient rehab setting. Physiatrist is providing close team supervision and 24 hour management of active medical problems listed below. Physiatrist and rehab team continue to assess barriers to discharge/monitor patient progress toward functional and medical goals  Care Tool:  Bathing    Body parts bathed by patient: Left arm, Chest, Abdomen, Front perineal area, Buttocks, Right upper leg, Left upper leg, Face, Right lower leg, Left lower leg   Body parts bathed by helper: Right arm, Right lower leg, Left lower leg     Bathing assist Assist Level: Moderate Assistance - Patient 50 - 74%     Upper Body Dressing/Undressing Upper body dressing   What is the patient wearing?: Pull over shirt    Upper body assist Assist Level: Minimal Assistance - Patient > 75%    Lower Body Dressing/Undressing Lower body dressing      What is the patient wearing?: Pants, Underwear/pull up     Lower body assist Assist for lower body dressing: Maximal Assistance - Patient 25 - 49%     Toileting Toileting    Toileting assist Assist for toileting:  Total Assistance - Patient < 25%     Transfers Chair/bed transfer  Transfers assist  Chair/bed transfer activity did not occur: Safety/medical concerns  Chair/bed transfer assist level: Dependent - mechanical lift (stedy)     Locomotion Ambulation   Ambulation assist   Ambulation activity did not occur: Safety/medical concerns          Walk 10 feet activity   Assist  Walk 10 feet activity did not occur: Safety/medical concerns        Walk 50 feet activity   Assist Walk 50 feet with 2 turns activity did not occur: Safety/medical concerns         Walk 150 feet activity   Assist Walk  150 feet activity did not occur: Safety/medical concerns         Walk 10 feet on uneven surface  activity   Assist Walk 10 feet on uneven surfaces activity did not occur: Safety/medical concerns         Wheelchair     Assist Is the patient using a wheelchair?: Yes Type of Wheelchair: Manual    Wheelchair assist level: Dependent - Patient 0%      Wheelchair 50 feet with 2 turns activity    Assist        Assist Level: Dependent - Patient 0%   Wheelchair 150 feet activity     Assist      Assist Level: Dependent - Patient 0%   Blood pressure 106/69, pulse 68, temperature 98 F (36.7 C), temperature source Oral, resp. rate 17, height 5\' 10"  (1.778 m), weight 72.3 kg, SpO2 100%.  Medical Problem List and Plan: 1. 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             -patient may  shower             -ELOS/Goals: 5/29 days Min A  Con't CIR,    PRAFO and WHO for LLE and LUE  -Exp DC 5/29 2.  Antithrombotics: -DVT/anticoagulation: Eliquis              -antiplatelet therapy: ASA 81mg  daily 3. Pain Management/spasticity: Tylenol  as needed Post CVA HA resolved   5/11- Will add topamax  50 mg at bedtime 5/15 reduce topamax  to 25mg  and monitor  Bilateral knee pain , aching in am s- OA noted on Xrays in 2023 start diclofenac  gel   -emerging left sided tone.    -5/4 unable to tolerate even low dose baclofen --> stopped    -outpt botox candidate - Pain control overall with Tylenol , continue current regimen 4. Mood/Behavior/Sleep: Provide emotional support             -antipsychotic agents: N/A 5. Neuropsych/cognition: This patient is ?capable of making decisions on his own behalf. 6. Skin/Wound Care: Routine skin checks 7. Fluids/Electrolytes/Nutrition: Routine in and outs with follow-up chemistries Intake of fluids improved to yesterday  8.   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  Followed by heart failure team.  Monitor for any signs of fluid overload.  Toprol  XL 12.5 mg daily.  Monitor for any hypotension Vitals:   12/24/23 1929 12/25/23 0328  BP: 99/71 106/69  Pulse: 67 68  Resp: 17 17  Temp: 98.5 F (36.9 C) 98 F (36.7 C)  SpO2: 100% 100%    BP still on low side, off metoprolol ,HR mild brady to normal  [employer]  12/23/23 0413 12/24/23 0501 12/25/23 0500  Weight: 77.5 kg 72.4 kg 72.3 kg   Weight coming down   5/16 NO signs of overload, suspect wt today not accurate  5/17-18  weight stable today, no signs overload 9.  CAD/MI/CABG.  Follow-up cardiology services.  Currently maintained on aspirin  and Plavix  10.  VT.  Last episode noted 2023 with successful ATP. 11.  CKD stage IIIA=  Baseline creatinine 1.3-1.5.  Follow-up chemistries,stable  Creat  5/15 stable  Recheck tomorrow     Latest Ref Rng & Units 12/19/2023    5:23 AM 12/13/2023    5:08 AM 12/08/2023    5:49 AM  BMP  Glucose 70 - 99 mg/dL 87     BUN 8 - 23 mg/dL 17     Creatinine 1.61 - 1.24 mg/dL 0.96  0.45  4.09   Sodium 135 - 145 mmol/L 139     Potassium 3.5 - 5.1 mmol/L 4.0     Chloride 98 - 111 mmol/L 108     CO2 22 - 32 mmol/L 21     Calcium  8.9 - 10.3 mg/dL 8.9       12.  HIV.  Continue Dovato - followed at the [facility] 13.  Hyperlipidemia.  Lipitor/Zetia  14.  GERD.  Pepcid  15.  Constipation.  MiraLAX  daily, Senokot changed to 2 po BID    LBM 5/17-check xray-mild abdominal pain 16.  Dysphagia.  Patient notes coughing on current diet of mechanical soft.  Dysphagia 2 thin liquids and have speech therapy follow-up.   MBS and FEES looked ok   -now on dys 3/thin diet 17. Daily HA's since stroke- tylenol  + topamax  started 5/11, improved- try reducing dose to 25mg  and monitor   -5/16-8 HA controlled, continue current  18. Anemia  -recheck tomorrow LOS: 19 days A FACE TO FACE EVALUATION  WAS PERFORMED  Lylia Sand 12/25/2023, 1:42 PM

## 2023-12-26 DIAGNOSIS — K5901 Slow transit constipation: Secondary | ICD-10-CM

## 2023-12-26 DIAGNOSIS — F418 Other specified anxiety disorders: Secondary | ICD-10-CM

## 2023-12-26 LAB — CBC
HCT: 38.3 % — ABNORMAL LOW (ref 39.0–52.0)
Hemoglobin: 12.9 g/dL — ABNORMAL LOW (ref 13.0–17.0)
MCH: 28.2 pg (ref 26.0–34.0)
MCHC: 33.7 g/dL (ref 30.0–36.0)
MCV: 83.8 fL (ref 80.0–100.0)
Platelets: 158 10*3/uL (ref 150–400)
RBC: 4.57 MIL/uL (ref 4.22–5.81)
RDW: 15.9 % — ABNORMAL HIGH (ref 11.5–15.5)
WBC: 4.7 10*3/uL (ref 4.0–10.5)
nRBC: 0 % (ref 0.0–0.2)

## 2023-12-26 LAB — COMPREHENSIVE METABOLIC PANEL WITH GFR
ALT: 115 U/L — ABNORMAL HIGH (ref 0–44)
AST: 43 U/L — ABNORMAL HIGH (ref 15–41)
Albumin: 3.2 g/dL — ABNORMAL LOW (ref 3.5–5.0)
Alkaline Phosphatase: 52 U/L (ref 38–126)
Anion gap: 8 (ref 5–15)
BUN: 22 mg/dL (ref 8–23)
CO2: 20 mmol/L — ABNORMAL LOW (ref 22–32)
Calcium: 9.2 mg/dL (ref 8.9–10.3)
Chloride: 112 mmol/L — ABNORMAL HIGH (ref 98–111)
Creatinine, Ser: 1.71 mg/dL — ABNORMAL HIGH (ref 0.61–1.24)
GFR, Estimated: 44 mL/min — ABNORMAL LOW (ref >60)
Glucose, Bld: 87 mg/dL (ref 70–99)
Potassium: 3.6 mmol/L (ref 3.5–5.1)
Sodium: 140 mmol/L (ref 135–145)
Total Bilirubin: 0.6 mg/dL (ref 0.0–1.2)
Total Protein: 6.7 g/dL (ref 6.5–8.1)

## 2023-12-26 MED ORDER — POLYETHYLENE GLYCOL 3350 17 G PO PACK
17.0000 g | PACK | Freq: Two times a day (BID) | ORAL | Status: DC
  Administered 2023-12-26 – 2024-01-05 (×20): 17 g via ORAL
  Filled 2023-12-26 (×18): qty 1

## 2023-12-26 NOTE — Progress Notes (Signed)
 Orthopedic Tech Progress Note Patient Details:  Richard [name] 01/21/1956 [id number]  Called in order to [employer] for an AFO   Patient ID: Richard [name], male   DOB: 1955-11-24, 68 y.o.   MRN: [id number]  Richard [name] 12/26/2023, 12:57 PM

## 2023-12-26 NOTE — Progress Notes (Signed)
 PROGRESS NOTE   Subjective/Complaints: Pt had some belly discomfort over the weekend which has resolved. Reported to pain in his left groin this morning which has now dissipated as well. Appetite is reasonable.   ROS: Patient denies fever, rash, sore throat, blurred vision, dizziness, nausea, vomiting, diarrhea, cough, shortness of breath or chest pain,   headache, or mood change.   Objective:   DG Abd 2 Views Result Date: 12/25/2023 CLINICAL DATA:  [id number] Abdominal pain [id number] EXAM: ABDOMEN - 2 VIEW COMPARISON:  September 06, 2022 FINDINGS: Diffuse gaseous distension of loops of bowel without frank dilation. Air is visualized in the rectum. No free air. LEFT chest AICD. Visualized lung bases are unremarkable. Degenerative changes of the lower lumbar spine IMPRESSION: Diffuse gaseous distension of bowel without evidence of obstruction Electronically Signed   By: Clancy Crimes M.D.   On: 12/25/2023 18:39    Recent Labs    12/26/23 0557  WBC 4.7  HGB 12.9*  HCT 38.3*  PLT 158     Recent Labs    12/26/23 0557  NA 140  K 3.6  CL 112*  CO2 20*  GLUCOSE 87  BUN 22  CREATININE 1.71*  CALCIUM  9.2       Intake/Output Summary (Last 24 hours) at 12/26/2023 1152 Last data filed at 12/26/2023 0800 Gross per 24 hour  Intake 676 ml  Output 825 ml  Net -149 ml        Physical Exam: Vital Signs Blood pressure 101/67, pulse 67, temperature 98.2 F (36.8 C), resp. rate 17, height 5\' 10"  (1.778 m), weight 71.8 kg, SpO2 98%.   Constitutional: No distress . Vital signs reviewed. HEENT: NCAT, EOMI, oral membranes moist Neck: supple Cardiovascular: RRR without murmur. No JVD    Respiratory/Chest: CTA Bilaterally without wheezes or rales. Normal effort    GI/Abdomen: BS +, non-tender, non-distended Ext: no clubbing, cyanosis, or edema Psych: pleasant and cooperative  Skin: No evidence of breakdown, no evidence of  rash Neurologic: awake and alert . Answers questions. Neurological: Ox3- delayed responses, but appropriatemotor strength is 5/5 in right and 0/5 left  deltoid, bicep, tricep, trace grip, hip flexor, knee extensors, ankle dorsiflexor and plantar flexor.  DTR's 3+ on left. Flexor tone 1+/4 LUE and 1/4 LLE.  Sensory exam normal sensation to light touch right side and LLE absent LT LUE but feels pinch  Musculoskeletal: Full range of motion in all 4 extremities. No joint swelling No significant TTP left thigh, no significant pain noted with left hip or knee ROM once again today. Hip adductors are little tight LLE.  Assessment/Plan: 1. Functional deficits which require 3+ hours per day of interdisciplinary therapy in a comprehensive inpatient rehab setting. Physiatrist is providing close team supervision and 24 hour management of active medical problems listed below. Physiatrist and rehab team continue to assess barriers to discharge/monitor patient progress toward functional and medical goals  Care Tool:  Bathing    Body parts bathed by patient: Left arm, Chest, Abdomen, Front perineal area, Buttocks, Right upper leg, Left upper leg, Face, Right lower leg, Left lower leg   Body parts bathed by helper: Right arm, Right lower leg, Left lower  leg     Bathing assist Assist Level: Moderate Assistance - Patient 50 - 74%     Upper Body Dressing/Undressing Upper body dressing   What is the patient wearing?: Pull over shirt    Upper body assist Assist Level: Minimal Assistance - Patient > 75%    Lower Body Dressing/Undressing Lower body dressing      What is the patient wearing?: Pants, Underwear/pull up     Lower body assist Assist for lower body dressing: Maximal Assistance - Patient 25 - 49%     Toileting Toileting    Toileting assist Assist for toileting: Total Assistance - Patient < 25%     Transfers Chair/bed transfer  Transfers assist  Chair/bed transfer activity did not  occur: Safety/medical concerns  Chair/bed transfer assist level: Dependent - mechanical lift (stedy)     Locomotion Ambulation   Ambulation assist   Ambulation activity did not occur: Safety/medical concerns          Walk 10 feet activity   Assist  Walk 10 feet activity did not occur: Safety/medical concerns        Walk 50 feet activity   Assist Walk 50 feet with 2 turns activity did not occur: Safety/medical concerns         Walk 150 feet activity   Assist Walk 150 feet activity did not occur: Safety/medical concerns         Walk 10 feet on uneven surface  activity   Assist Walk 10 feet on uneven surfaces activity did not occur: Safety/medical concerns         Wheelchair     Assist Is the patient using a wheelchair?: Yes Type of Wheelchair: Manual    Wheelchair assist level: Dependent - Patient 0%      Wheelchair 50 feet with 2 turns activity    Assist        Assist Level: Dependent - Patient 0%   Wheelchair 150 feet activity     Assist      Assist Level: Dependent - Patient 0%   Blood pressure 101/67, pulse 67, temperature 98.2 F (36.8 C), resp. rate 17, height 5\' 10"  (1.778 m), weight 71.8 kg, SpO2 98%.  Medical Problem List and Plan: 1. 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             -patient may  shower             -ELOS/Goals: 5/29 days Min A  -Continue CIR therapies including PT, OT, and SLP    PRAFO and WHO for LLE and LUE    2.  Antithrombotics: -DVT/anticoagulation: Eliquis              -antiplatelet therapy: ASA 81mg  daily 3. Pain Management/spasticity: Tylenol  as needed Post CVA HA resolved   5/11- Will add topamax  50 mg at bedtime 5/15 reduce topamax  to 25mg  and monitor  Bilateral knee pain , aching in am s- OA noted on Xrays in 2023 start diclofenac  gel   -emerging left sided  tone.    -5/4 unable to tolerate even low dose baclofen --> stopped    -outpt botox candidate - Pain control overall with Tylenol , continue current regimen -5/19 told pt that some of his groin pain might be intermitent spasm of his left hip adductors. Continue with ROM, activity with therapy.  4. Mood/Behavior/Sleep: Provide emotional support             -  antipsychotic agents: N/A 5. Neuropsych/cognition: This patient is ?capable of making decisions on his own behalf. 6. Skin/Wound Care: Routine skin checks 7. Fluids/Electrolytes/Nutrition: Routine in and outs with follow-up chemistries Intake of fluids improved to yesterday  8.  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  Followed by heart failure team.  Monitor for any signs of fluid overload.  Toprol  XL 12.5 mg daily.  Monitor for any hypotension Vitals:   12/25/23 1954 12/26/23 0455  BP: 101/65 101/67  Pulse: 64 67  Resp: 16 17  Temp: 98.5 F (36.9 C) 98.2 F (36.8 C)  SpO2: 100% 98%    5/19  BP remains soft off metoprolol ,HR mild brady to normal  [employer]   12/24/23 0501 12/25/23 0500 12/26/23 0456  Weight: 72.4 kg 72.3 kg 71.8 kg   Weight coming down   5/16 NO signs of overload, suspect wt today not accurate  5/17-19 weight stable today, no signs overload 9.  CAD/MI/CABG.  Follow-up cardiology services.  Currently maintained on aspirin  and Plavix  10.  VT.  Last episode noted 2023 with successful ATP. 11.  CKD stage IIIA=  Baseline creatinine 1.3-1.5.  Follow-up chemistries,stable  Creat  5/19 Cr up to 1.7 today, sl above baseline--encourage adequate fluids -f/u later in week     Latest Ref Rng & Units 12/26/2023    5:57 AM 12/19/2023    5:23 AM 12/13/2023    5:08 AM  BMP  Glucose 70 - 99 mg/dL 87  87    BUN 8 - 23 mg/dL 22  17    Creatinine 1.61 - 1.24 mg/dL 0.96  0.45  4.09   Sodium 135 - 145 mmol/L 140  139    Potassium 3.5 - 5.1 mmol/L 3.6  4.0    Chloride  98 - 111 mmol/L 112  108    CO2 22 - 32 mmol/L 20  21    Calcium  8.9 - 10.3 mg/dL 9.2  8.9      12.  HIV.  Continue Dovato - followed at the [facility] 13.  Hyperlipidemia.  Lipitor/Zetia  14.  GERD.  Pepcid  15.  Constipation.  MiraLAX  daily, Senokot changed to 2 po BID    LBM 5/17-   5/19 I reviewed kub with pt/wife. Mostly just gaseous distention   -he is feeling better today   -maalox gas if needed, increase miralax  to bid   -encouraged on going po intake   -keep bowels moving 16.  Dysphagia.  Patient notes coughing on current diet of mechanical soft.  Dysphagia 2 thin liquids and have speech therapy follow-up.   MBS and FEES looked ok   -now on dys 3/thin diet 17. Daily HA's since stroke- tylenol  + topamax  started 5/11, improved- try reducing dose to 25mg  and monitor   -5/16-8 HA controlled, continue current  18. Anemia  -recheck tomorrow LOS: 20 days A FACE TO FACE EVALUATION WAS PERFORMED  Rawland Caddy 12/26/2023, 11:52 AM

## 2023-12-26 NOTE — Progress Notes (Signed)
 Occupational Therapy Session Note  Patient Details  Name: Richard [name] MRN: [id number] Date of Birth: 1956-02-11  Today's Date: 12/26/2023 OT Individual Time: 1000-1045 OT Individual Time Calculation (min): 45 min    Short Term Goals: Week 3:  OT Short Term Goal 1 (Week 3): Pt will maintain sitting balance in preparation for functional transfers with close supervision consistently OT Short Term Goal 2 (Week 3): Pt will complete one step of lower body dressing OT Short Term Goal 3 (Week 3): Pt will complete toilet transfers with mod A using LRAD  Skilled Therapeutic Interventions/Progress Updates:     Pt received finishing SLP session. Pt presenting to be in good spirits receptive to skilled OT session reporting 0/10 pain- OT offering intermittent rest breaks, repositioning, and therapeutic support to optimize participation in therapy session. Focused this session on functional movement patterns, L hemibody NMRE, body awareness, and midline orientation. Pt transitioned to EOB using bed rail with min A. Sitting EOB, Pt able to maintain sitting balance with close supervision with R UE supported, however Pt fatigues quickly requiring min A for posterior trunk support. Worked on dynamic sitting balance and trunk control during BADLs. Pt recalled hemi-dressing technique, however required mod verbal cues to initiate using techniques. Donned clean shirt with min A overall for L UE positioning in sleeve. Assistance required to cross L LE over R to weave foot into pants with Pt then able to weave R LE with mod A required for dynamic sitting balance. Pt stood from EOB with R HHA with min A +2 with OT facilitating WB'ing through L LE and stabilizing L knee to avoid buckling. Engaged Pt in completing gentle lateral neck flexion stretches sitting EOB prior to standing to decrease stiffness and tone in neck flexors. Worked on place and hold exercises with Pt tasked with maintaining head in midline with Pt  only able to tolerate maintaining head position for 10-20 sec. Worked on blocked practice of sit<>stands from EOB and standing tolerance with emphasis on decreasing lateral push to L, midline orientation, and head/trunk control. Pt able to tolerate standing 30 sec-2 minutes with min-mod A +2 HHA with consistent verbal and tactile cues required for midline orientation and body alignment x4 trials. Squat pivot to R min A +2 CGA to TISWC. Provided education on L UE positioning to avoid injury and engaged Pt in completing gentle prolonged stretch of L UE to decrease tone with noted improvement in elbow following stretch. Pt was left resting in TISWC with call bell in reach, seatbelt alarm on, and all needs met.    Therapy Documentation Precautions:  Precautions Precautions: Fall, Other (comment) Precaution/Restrictions Comments: L hemiplegia with flaccidity, R pusher (can correct to pull with vc), dysarthria Splint/Cast - Date Prophylactic Dressing Applied (if applicable):  (prafo boot) Restrictions Weight Bearing Restrictions Per Provider Order: No   Therapy/Group: Individual Therapy  Geoffery Kiel 12/26/2023, 7:54 AM

## 2023-12-26 NOTE — Progress Notes (Signed)
 Physical Therapy Session Note  Patient Details  Name: Richard [name] MRN: [id number] Date of Birth: 05-12-1956  Today's Date: 12/26/2023 PT Individual Time: 4098-1191 PT Individual Time Calculation (min): 46 min   Short Term Goals: Week 2:  PT Short Term Goal 1 (Week 2): Pt will perform bed mobility with MinA. PT Short Term Goal 1 - Progress (Week 2): Progressing toward goal PT Short Term Goal 2 (Week 2): Pt will perform squat pivot transfers with MinA. PT Short Term Goal 2 - Progress (Week 2): Progressing toward goal PT Short Term Goal 3 (Week 2): Pt will perform sit<>stand transfers with ModA. PT Short Term Goal 3 - Progress (Week 2): Met PT Short Term Goal 4 (Week 2): Pt will ambulate at least 30 ft using LRAD with ModA +2. PT Short Term Goal 4 - Progress (Week 2): Met PT Short Term Goal 5 (Week 2): Pt will initiate w/c mobility training. PT Short Term Goal 5 - Progress (Week 2): Met Week 3:  PT Short Term Goal 1 (Week 3): Pt will perform bed mobility with CGA/ MinA. PT Short Term Goal 2 (Week 3): Pt will perform squat pivot transfers with CGA. PT Short Term Goal 3 (Week 3): Pt will perform sit<>stand transfers with MinA. PT Short Term Goal 4 (Week 3): Pt will ambulate at least 30 ft using LRAD with MinA +2. PT Short Term Goal 5 (Week 3): Pt will initiate stair training.  Skilled Therapeutic Interventions/Progress Updates:  Patient seated on toilet with NT assisting for toileting and pericare on entrance to room. Patient alert and agreeable to PT session.   Patient with no pain complaint at start of session.  Therapeutic Activity/ NMR: Transfers: Pt performed sit<>stand transfers throughout session with MinA to stand and ModA to maintain balance with R hold to steady surface. Continues to lean to L without self correction. Requires cues to attempt to correct with R lean and hip shift.   Blocked practice of squat pivot transfers w/c <>armless chair for repetition and  sequencing of proper technique including safe setup of LLE and attn to LUE.   NMR performed for improvements in motor control and coordination, balance, sequencing, judgement, and self confidence/ efficacy in performing all aspects of mobility at highest level of independence.   Gait Training:  Pt ambulated 55 ft using R HHA and pelvic stability to L side - not underarm support. Demonstrated improved trunk control but continued forward flexion in trunk. Also demos mild hip initiation for advancement of LLE with step progression, continues to require MaxA to advance but can hold LLE in mild flexion for stance phase with no block to L knee. Provided vc/ tc for sequencing lateral hip shift, forward hip translation in swing phase on L and focus to position of foot.   Patient supine in bed at end of session with brakes locked, bed alarm set, and all needs within reach.   Therapy Documentation Precautions:  Precautions Precautions: Fall, Other (comment) Precaution/Restrictions Comments: L hemiplegia with flaccidity, R pusher (can correct to pull with vc), dysarthria Splint/Cast - Date Prophylactic Dressing Applied (if applicable):  (prafo boot) Restrictions Weight Bearing Restrictions Per Provider Order: No  Pain: No pain related this session  Therapy/Group: Individual Therapy  Donne Gage PT, DPT, CSRS 12/26/2023, 6:04 PM

## 2023-12-26 NOTE — Progress Notes (Signed)
 Speech Language Pathology Daily Session Note  Patient Details  Name: Richard [name] MRN: [id number] Date of Birth: 1956/02/18  Today's Date: 12/26/2023 SLP Individual Time: 4098-1191; 4782-9562 SLP Individual Time Calculation (min): 61 min; 42 min  Short Term Goals: Week 3: SLP Short Term Goal 1 (Week 3): Patient will increase speech intelligibility to 90% with use of compensatory strategies given supervision multimodal A SLP Short Term Goal 2 (Week 3): Patient will recall and utilize memory compensatory strategies given supervision multimodal A SLP Short Term Goal 3 (Week 3): Patient will demonstrate problem solving skills during mildly complex functional situations given sup  multimodal A SLP Short Term Goal 4 (Week 3): Patient will utilize compensatory strategies during consumption of least restrictive diet with sup multimodal A  Skilled Therapeutic Interventions:  Session 1: Patient was seen in am to address cognitive re- training and speech intelligibility. Pt was alert and seated in bed upon SLP arrival. He was agreeable for session. Pt was indep oriented to temporal, spatial and situational concepts. SLP initiated completion of IOPI for linugal strengthening. Pt completed 1 set of 20 at 15kPA anteriorly and 1 set of 20 at 10 kPA posteriorly. Pt was initially started at 12 kPA however, noted increased difficulty warranting decreasing intensity to 10kPA. In other minutes of session, pt recalled 2/4 speech intelligibility strategies indep improving to 4/4 with min A. SLP addressed memory and problem solving through a word list retention/ category exclusion task. Pt instructed in use of repetition strategies with great carryover throught ultimately, completing task with 80% acc indep improving to 100% with sup A. In other minutes of session, pt was challenged in paragraph retention of complex information. SLP guided pt in repetition and association strategies. Pt recalled information after a  10 minute delay with 60% acc which did not improve with additional cues. Pt was also challenged in problem solving where he was challenged to identify information on a restaurant menu as well as make inferences which he completed with min to mod A. At conclusion of session, pt was left upright in bed with call button within reach and bed alarm active. SLP to continue POC.    Session 2: Pt was seen in PM to address dysphagia management, cognitive re- training, and speech intelligibility. Pt was alerted with moderate verbal cues and additional time. Pt denied pain and was agreeable to session. At lunch, SLP was given a message to call pt's wife. During call, pt's wife expressed concerns regarding continued s/s aspiration with solids and liquids. Given information from wife, SLP reviewed safe swallowing strategies with pt including adequate postioning, slow rate, and small single bites. SLP also introduced pharyngeal strengthening exercises specifically effortful swallows. Pt completed 1 set of 10 given sup A. In other minutes of session, SLP addressed problem solving through challenging pt to identify solutions to daily living and medical scenarios presented verbally, pt identified appropriate solutions with 90% acc and sup A. Throughout session, SLP assessed pt's speech intelligibility. SLP engaged pt in conversation about his family members and former job. During conversation, SLP instructed pt in use of over articulation and slowing rate with pt warranting min A throughout and maintaining ~80% intelligibility at sentence level. Dietary staff in and out getting meal orders. Pt utilizing speech intelligibility strategies at the word and phrase level with ability to maintain speech intelligibility subjectively of over 90%. At conclusion of session pt was left upright in bed with call button within reach and bed alarm active. SLP to continue POC.  Pain Pain Assessment Pain Scale: 0-10 Pain Score: 0-No  pain  Therapy/Group: Individual Therapy  Adela Holter 12/26/2023, 9:39 AM

## 2023-12-27 DIAGNOSIS — R7989 Other specified abnormal findings of blood chemistry: Secondary | ICD-10-CM

## 2023-12-27 DIAGNOSIS — F418 Other specified anxiety disorders: Secondary | ICD-10-CM

## 2023-12-27 MED ORDER — ACETAMINOPHEN 325 MG PO TABS
650.0000 mg | ORAL_TABLET | Freq: Four times a day (QID) | ORAL | Status: DC | PRN
  Administered 2023-12-27 – 2024-01-03 (×8): 650 mg via ORAL
  Filled 2023-12-27 (×10): qty 2

## 2023-12-27 MED ORDER — ACETAMINOPHEN 160 MG/5ML PO SOLN: 650.0000 mg | Freq: Four times a day (QID) | ORAL | Status: DC | PRN

## 2023-12-27 MED ORDER — ACETAMINOPHEN 650 MG RE SUPP: 650.0000 mg | Freq: Four times a day (QID) | RECTAL | Status: DC | PRN

## 2023-12-27 NOTE — Progress Notes (Addendum)
 PROGRESS NOTE   Subjective/Complaints: Continues occasional mild abdominal pain. Reports last BM was hard. No additional concerns.   ROS: Patient denies fever, rash, sore throat, blurred vision, dizziness, nausea, vomiting, diarrhea, cough, shortness of breath or chest pain,   headache, or mood change.  + constipation   Objective:   DG Abd 2 Views Result Date: 12/25/2023 CLINICAL DATA:  [id number] Abdominal pain [id number] EXAM: ABDOMEN - 2 VIEW COMPARISON:  September 06, 2022 FINDINGS: Diffuse gaseous distension of loops of bowel without frank dilation. Air is visualized in the rectum. No free air. LEFT chest AICD. Visualized lung bases are unremarkable. Degenerative changes of the lower lumbar spine IMPRESSION: Diffuse gaseous distension of bowel without evidence of obstruction Electronically Signed   By: Clancy Crimes M.D.   On: 12/25/2023 18:39    Recent Labs    12/26/23 0557  WBC 4.7  HGB 12.9*  HCT 38.3*  PLT 158     Recent Labs    12/26/23 0557  NA 140  K 3.6  CL 112*  CO2 20*  GLUCOSE 87  BUN 22  CREATININE 1.71*  CALCIUM  9.2       Intake/Output Summary (Last 24 hours) at 12/27/2023 1321 Last data filed at 12/27/2023 1246 Gross per 24 hour  Intake 770 ml  Output 800 ml  Net -30 ml        Physical Exam: Vital Signs Blood pressure 103/74, pulse 81, temperature (!) 97.4 F (36.3 C), resp. rate 18, height 5\' 10"  (1.778 m), weight 74.1 kg, SpO2 99%.   Constitutional: No distress . Vital signs reviewed. HEENT: NCAT, EOMI, oral membranes moist Neck: supple Cardiovascular: RRR without murmur. No JVD    Respiratory/Chest: CTA Bilaterally without wheezes or rales. Normal effort    GI/Abdomen: BS + normoactive, non-tender, non-distended, soft Ext: no clubbing, cyanosis, or edema Psych: pleasant and cooperative  Skin: No evidence of breakdown, no evidence of rash Neurologic: awake and alert . Answers  questions. Neurological: Ox3- delayed responses, but appropriatemotor strength is 5/5 in right and 0/5 left  deltoid, bicep, tricep, trace grip, hip flexor, knee extensors, ankle dorsiflexor and plantar flexor.  DTR's 3+ on left. Flexor tone 1+/4 LUE and 1/4 LLE.  Sensory exam normal sensation to light touch right side and LLE absent LT LUE but feels pinch  Musculoskeletal: Full range of motion in all 4 extremities. No joint swelling No significant TTP left thigh, no significant pain noted with left hip or knee ROM  Hip adductors are little tight LLE.  Assessment/Plan: 1. Functional deficits which require 3+ hours per day of interdisciplinary therapy in a comprehensive inpatient rehab setting. Physiatrist is providing close team supervision and 24 hour management of active medical problems listed below. Physiatrist and rehab team continue to assess barriers to discharge/monitor patient progress toward functional and medical goals  Care Tool:  Bathing    Body parts bathed by patient: Left arm, Chest, Abdomen, Front perineal area, Buttocks, Right upper leg, Left upper leg, Face, Right lower leg, Left lower leg   Body parts bathed by helper: Right arm, Right lower leg, Left lower leg     Bathing assist Assist Level: Moderate Assistance -  Patient 50 - 74%     Upper Body Dressing/Undressing Upper body dressing   What is the patient wearing?: Pull over shirt    Upper body assist Assist Level: Minimal Assistance - Patient > 75%    Lower Body Dressing/Undressing Lower body dressing      What is the patient wearing?: Pants, Underwear/pull up     Lower body assist Assist for lower body dressing: Maximal Assistance - Patient 25 - 49%     Toileting Toileting    Toileting assist Assist for toileting: Total Assistance - Patient < 25%     Transfers Chair/bed transfer  Transfers assist  Chair/bed transfer activity did not occur: Safety/medical concerns  Chair/bed transfer assist  level: Dependent - mechanical lift (stedy)     Locomotion Ambulation   Ambulation assist   Ambulation activity did not occur: Safety/medical concerns          Walk 10 feet activity   Assist  Walk 10 feet activity did not occur: Safety/medical concerns        Walk 50 feet activity   Assist Walk 50 feet with 2 turns activity did not occur: Safety/medical concerns         Walk 150 feet activity   Assist Walk 150 feet activity did not occur: Safety/medical concerns         Walk 10 feet on uneven surface  activity   Assist Walk 10 feet on uneven surfaces activity did not occur: Safety/medical concerns         Wheelchair     Assist Is the patient using a wheelchair?: Yes Type of Wheelchair: Manual    Wheelchair assist level: Dependent - Patient 0%      Wheelchair 50 feet with 2 turns activity    Assist        Assist Level: Dependent - Patient 0%   Wheelchair 150 feet activity     Assist      Assist Level: Dependent - Patient 0%   Blood pressure 103/74, pulse 81, temperature (!) 97.4 F (36.3 C), resp. rate 18, height 5\' 10"  (1.778 m), weight 74.1 kg, SpO2 99%.  Medical Problem List and Plan: 1. 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             -patient may  shower             -ELOS/Goals: 5/29 days Min A  -Continue CIR therapies including PT, OT, and SLP    PRAFO and WHO for LLE and LUE    2.  Antithrombotics: -DVT/anticoagulation: Eliquis              -antiplatelet therapy: ASA 81mg  daily 3. Pain Management/spasticity: Tylenol  as needed Post CVA HA resolved   5/11- Will add topamax  50 mg at bedtime 5/15 reduce topamax  to 25mg  and monitor  Bilateral knee pain , aching in am s- OA noted on Xrays in 2023 start diclofenac  gel   -emerging left sided tone.    -5/4 unable to tolerate even low dose baclofen -->  stopped    -outpt botox candidate - Pain control overall with Tylenol , continue current regimen -5/19 told pt that some of his groin pain might be intermitent spasm of his left hip adductors. Continue with ROM, activity with therapy.  -5/20 pain overall controlled, continue current regimen 4. Mood/Behavior/Sleep: Provide emotional support             -antipsychotic  agents: N/A 5. Neuropsych/cognition: This patient is ?capable of making decisions on his own behalf. 6. Skin/Wound Care: Routine skin checks 7. Fluids/Electrolytes/Nutrition: Routine in and outs with follow-up chemistries Intake of fluids improved to yesterday  8.  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  Followed by heart failure team.  Monitor for any signs of fluid overload.  Toprol  XL 12.5 mg daily.  Monitor for any hypotension  .vital Vitals:   12/27/23 0518 12/27/23 1249  BP: (P) 100/70 103/74  Pulse: (P) 63 81  Resp: (P) 16 18  Temp: (P) 98.5 F (36.9 C) (!) 97.4 F (36.3 C)  SpO2: (P) 99% 99%    5/19  BP remains soft off metoprolol ,HR mild brady to normal   5/20 BP a little Soft, HR stable, continue to monitor  Filed Weights   12/25/23 0500 12/26/23 0456 12/27/23 0620  Weight: 72.3 kg 71.8 kg 74.1 kg   Weight coming down   5/16 NO signs of overload, suspect wt today not accurate  5/17-20 weight stable today, no signs overload 9.  CAD/MI/CABG.  Follow-up cardiology services.  Currently maintained on aspirin  and Plavix  10.  VT.  Last episode noted 2023 with successful ATP. 11.  CKD stage IIIA=  Baseline creatinine 1.3-1.5.  Follow-up chemistries,stable  Creat  5/19 Cr up to 1.7 today, sl above baseline--encourage adequate fluids -Recheck thursday    Latest Ref Rng & Units 12/26/2023    5:57 AM 12/19/2023    5:23 AM 12/13/2023    5:08 AM  BMP  Glucose 70 - 99 mg/dL 87  87    BUN 8 - 23 mg/dL 22  17    Creatinine 6.21 - 1.24 mg/dL 3.08  6.57  8.46    Sodium 135 - 145 mmol/L 140  139    Potassium 3.5 - 5.1 mmol/L 3.6  4.0    Chloride 98 - 111 mmol/L 112  108    CO2 22 - 32 mmol/L 20  21    Calcium  8.9 - 10.3 mg/dL 9.2  8.9      12.  HIV.  Continue Dovato - followed at the [facility] 13.  Hyperlipidemia.  Lipitor/Zetia  14.  GERD.  Pepcid  15.  Constipation.  MiraLAX  daily, Senokot changed to 2 po BID    LBM 5/17-   5/19 I reviewed kub with pt/wife. Mostly just gaseous distention   -he is feeling better today   -maalox gas if needed, increase miralax  to bid   -encouraged on going po intake   -keep bowels moving  5/20 Consider additional med if no BM today 16.  Dysphagia.  Patient notes coughing on current diet of mechanical soft.  Dysphagia 2 thin liquids and have speech therapy follow-up.   MBS and FEES looked ok   -now on dys 3/thin diet 17. Daily HA's since stroke- tylenol  + topamax  started 5/11, improved- try reducing dose to 25mg  and monitor   -5/16-20 HA controlled, continue current  18. Anemia  -recheck tomorrow 19, Elevated LFTs  -adjust tylenol  to q6h prn, recheck thurs  LOS: 21 days A FACE TO FACE EVALUATION WAS PERFORMED  Lylia Sand 12/27/2023, 1:21 PM

## 2023-12-27 NOTE — Progress Notes (Signed)
 Speech Language Pathology Daily Session Note  Patient Details  Name: Richard [name] MRN: [id number] Date of Birth: Jan 08, 1956  Today's Date: 12/27/2023 SLP Individual Time: 1000-1100 SLP Individual Time Calculation (min): 60 min  Short Term Goals: Week 3: SLP Short Term Goal 1 (Week 3): Patient will increase speech intelligibility to 90% with use of compensatory strategies given supervision multimodal A SLP Short Term Goal 2 (Week 3): Patient will recall and utilize memory compensatory strategies given supervision multimodal A SLP Short Term Goal 3 (Week 3): Patient will demonstrate problem solving skills during mildly complex functional situations given sup  multimodal A SLP Short Term Goal 4 (Week 3): Patient will utilize compensatory strategies during consumption of least restrictive diet with sup multimodal A  Skilled Therapeutic Interventions: Skilled therapy session focused on cognitive and dysphagia goals. SLP facilitated sesison by reviewing memory book. Patient independently recalled activities completed in prior session this date. Patient recalled 2/4 WRAP memory strategies independently and remaining 2 with modA. SLP then targeted dysphagia goals through prompting patient to complete 10 effortful swallows, 30 CTARS (chin tucks against resistance) and 10 [place]. Patient required supervision A for CTARS and effortful swallows and maxA for [place]. Patient with occasional immediate cough after thin liquids, though improved when providing minA to take small, slow sips.  SLP brought in provale 5cc to trial with patient. Patient with reduced s/sx of aspiration with use of provale and reports preference vs cup edge. SLP placed signs in room with this information. SLP continued to target memory through paragraph comprehension task. Patient answered comprehension questions with 80% accuracy independently increasing to 90% accuracy given min verbal A. Patient independently aware that this task  was difficult, however likely due to increased lethargy at the end of the session. Patient left in bed with alarm set and call bell in reach. Continue POC  Pain None reported   Therapy/Group: Individual Therapy  Aalliyah Kilker M.A., CCC-SLP 12/27/2023, 7:46 AM

## 2023-12-27 NOTE — Progress Notes (Signed)
 Occupational Therapy Session Note  Patient Details  Name: Richard [name] MRN: [id number] Date of Birth: Feb 22, 1956  Today's Date: 12/27/2023 OT Individual Time: 1418-1530 OT Individual Time Calculation (min): 72 min    Short Term Goals: Week 3:  OT Short Term Goal 1 (Week 3): Pt will maintain sitting balance in preparation for functional transfers with close supervision consistently OT Short Term Goal 2 (Week 3): Pt will complete one step of lower body dressing OT Short Term Goal 3 (Week 3): Pt will complete toilet transfers with mod A using LRAD  Skilled Therapeutic Interventions/Progress Updates:     Pt received sitting up in TISWC presenting to be in good spirits receptive to skilled OT session reporting 0/10 pain- OT offering intermittent rest breaks, repositioning, and therapeutic support to optimize participation in therapy session. Pt requesting to take shower this session. focused this session on dynamic sitting balance, L side attention, functional movement patterns, and BADL retraining within context of shower level ADLs.    Utilized STEDY for functional transfer to TTB positioned in walk-in shower and TISWC to facilitate increased opportunities to work on sit<>stands and standing balance. Pt able to complete sit<>stands with min A overall with R UE supported on STEDY grab bar and L UE supported in sling- verbal cues required for midline orientation and to facilitate lateral weight shift to R. Perched sitting balance maintained with close supervision through session.    U/LB bathing completed seated on TTB. Worked on incorporating R UE into functional task for increased sitting balance challenge- Pt able to intermittently maintain sitting balance with supervision, however when fatigued or distracted, Pt would begin to fall to L requiring CGA-min A to correct. Pt utilized long handled sponge to wash lower B LEs and feet. Pt completed single stand during bathing task with R UE  supported on grab bar and OT providing mod A for standing balance while washing Pt's buttocks. Min A required for washing buttocks and R UE/underarm +consistent verbal cues for head/trunk positioning.    Following shower, U/LB dressing completed seated EOB. Max A required to weave L LE with Pt then able to lift R LE to weave into pants. Pt stood with mod A +2 min A and maintained standing balance with mod A while +2 assisted with bringing pants to waist. Pt able to weave L UE into shirt with min A and then finish UB dressing task with verbal cues only. Pt fatigued at end of session and requesting to return to bed. EOB > supine mod A +mod verbal cues for technique.   Pt was left resting in bed with call bell in reach, bed alarm on, and all needs met.    Therapy Documentation Precautions:  Precautions Precautions: Fall, Other (comment) Precaution/Restrictions Comments: L hemiplegia with flaccidity, R pusher (can correct to pull with vc), dysarthria Splint/Cast - Date Prophylactic Dressing Applied (if applicable):  (prafo boot) Restrictions Weight Bearing Restrictions Per Provider Order: No  Therapy/Group: Individual Therapy  Geoffery Kiel 12/27/2023, 8:01 AM

## 2023-12-27 NOTE — Consult Note (Signed)
 Neuropsychological Consultation Comprehensive Inpatient Rehab   Patient:   Richard [name]   DOB:   1956/05/25  MR Number:  [id number]  Location:  [facility] [address] CENTER [address] [place] [address] Dept: [phone] Loc: [phone]           Date of Service:   12/26/2023  Start Time:   8 AM End Time:   9 AM  Provider/Observer:  Chapman Commodore, Psy.D.       Clinical Neuropsychologist       Billing Code/Service: [phone]  Reason for Service:    Richard [name] is a 68 year old male referred for neuropsychological consultation during his ongoing admission to the comprehensive inpatient rehabilitation unit due to issues with adjustment with extended hospital stay and past history of anxiety and depression and more recent episodes of psychosis.  Patient has a significant history for HIV and is maintained on antiretrovirals.  Patient with chronic kidney disease stage III, CAD/MI/CABG 2015, severe ischemic cardiomyopathy, compensated chronic systolic and systolic congestive heart failure, history of NSVT, hyperlipidemia and hypertension.  Patient presented on 12/01/2023 with left-sided weakness and dysarthria of unknown duration.  CT head did show subtle hypoattenuation involving the right caudate and right lentiform nuclei.  MRI follow-up did reveal moderate-sized acute infarct of the right MCA distribution.  After patient was stabilized and treated he was referred to CIR due to decreased functional mobility including left-sided weakness, dysarthria.  During today's clinical visit, the patient was oriented and aware that he was dealing with a cerebrovascular accident.  Patient was also aware of previous right frontotemporal stroke and has been dealing with some cognitive issues from those.  Patient noted stability with his anxiety depression but he was struggling with extended hospital stay and concerns over his status.   Today we worked on coping and adjustment issues.  Patient denied any significant exacerbation of depression or anxiety and reports that his mood state is not having a negative deleterious impact on his capacity to participate in therapeutic interventions.  HPI for the current admission:    HPI: Richard [name] is a 68 year old right-handed male with history significant for HIV maintained on Dovato , CKD stage IIIa with baseline creatinine 1.3-1.5, CAD/MI/CABG 2015 followed by Dr. 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 Per chart review patient lives with spouse. 1 level home 2 steps to enter. Independent prior to admission. Presented 12/01/2023 with left-sided weakness and dysarthria of unclear duration. CT of the head showed subtle hypoattenuation involving the right caudate and right lentiform nuclei. No acute intracranial hemorrhage. Remote infarct involving the right frontal operculum, insula and superior right temporal lobe. CTA of head and neck showed occlusion of the right M1 segment as well as proximal M2 branch of the right MCA and intraluminal thrombus noted. Reconstitution of multiple M2 branches of the right MCA. Several M2 inferior division branches demonstrated diminished intraluminal contrast. 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 Reconstitution of the supraclinoid left ICA as well as left ACA and left MCA via the circle of Willis. Admission chemistries unremarkable except creatinine 1.33, alcohol negative, SARS coronavirus negative. Patient underwent revascularization of right MCA occlusion per interventional radiology. MRI follow-up revealed moderate-sized acute infarct in the right MCA distribution. Echocardiogram with ejection fraction of 20 to 25%. The left ventricle demonstrating global  hypokinesis. Grade 1 diastolic dysfunction. Neurology follow-up patient initially maintained on low-dose aspirin  81 mg daily and Plavix  75 mg daily for CVA prophylaxis and transition to Eliquis  and continue Plavix  with aspirin  discontinued. Cardiology services continues to follow for acute on chronic biventricular systolic heart failure his Toprol  was resumed there was some difficulty in tapering off pressors with bouts of hypotension. Patient currently on a mechanical soft diet. Therapy evaluations completed due to patient decreased functional mobility left-sided weakness and dysarthria was admitted for a comprehensive rehab program.   Medical History:   Past Medical History:  Diagnosis Date   Coronary artery disease    a. cath 04/02/2014 3v dx 80-90% in D1, 80-90% mid LAD, 95% apical LAD, 95% distal inferior LV branch of LCx, total occlusion of prox RCA  b). Cath 04/03/2014 DES to mid LAD, DES x3 to prox D1, DAPT indefinitely. LCx lesion untreated   GERD (gastroesophageal reflux disease)    HIV (human immunodeficiency virus infection) (HCC)    Hypertension    Ischemic cardiomyopathy 08/09/2013   EF 20-25 percent by echo in 04/2014, s/p 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   Acute ischemic right MCA stroke (HCC) 12/01/2023   Schizophrenia, unspecified (HCC) 03/07/2023   Recurrent major depression (HCC) 03/07/2023   Pseudophakia of left eye 03/07/2023   Acid reflux 03/07/2023   Dyspepsia 03/07/2023   Anxiety state 03/07/2023   Cortical age-related cataract of right eye 03/07/2023   Psychotic disorder (HCC) 03/07/2023   CAD (coronary artery disease) 03/07/2023   Abdominal mass 07/12/2021   Peripheral vascular disease (HCC) 02/04/2021    Overweight 01/21/2020   Dysphagia 12/04/2018   Hiatal hernia 04/19/2018   Gastroesophageal reflux disease 04/03/2018   Ischemic congestive cardiomyopathy (HCC) 10/17/2017   Generalized anxiety disorder 10/17/2017   Prediabetes 10/16/2017   Acquired thrombocytopenia (HCC) 10/16/2017   Chronic kidney disease, stage 3 (HCC) 10/16/2017   Primary erectile dysfunction 10/13/2017   Chronic combined systolic and diastolic heart failure (HCC) 12/30/2015   Traumatic rupture of collateral ligament of other finger at metacarpophalangeal and interphalangeal joint, subsequent encounter 10/30/2015   Traumatic rupture of collateral ligament of other finger at metacarpophalangeal and interphalangeal joint, initial encounter 08/12/2015   VT (ventricular tachycardia) (HCC) 12/31/2014   ICD (implantable cardioverter-defibrillator) in place 09/10/2014   At risk for sudden cardiac death September 10, 2014   Systolic heart failure (HCC) 08/09/2014   Coronary artery disease involving native coronary artery of native heart without angina pectoris 04/04/2014   Unstable angina (HCC) 04/03/2014   Cardiomyopathy, ischemic 03/22/2014   History of CVA (cerebrovascular accident) 02/20/2014   Human immunodeficiency virus infection (HCC) 02/20/2014   Essential hypertension 02/20/2014   Hyperlipidemia LDL goal <70 02/20/2014   Depression 02/20/2014   Cerebral infarction (HCC) 08/09/2006    Behavioral Observation/Mental Status:   Richard [name]  presents as a 68 y.o.-year-old Right handed African American Male who appeared his stated age. his dress was Appropriate and he was Well Groomed and his manners were Appropriate to the situation.  his participation was indicative of Appropriate behaviors.  There were physical disabilities noted.  he displayed an appropriate level of cooperation and motivation.    Interactions:    Active Appropriate  Attention:   abnormal and attention span appeared shorter than expected for  age  Memory:   abnormal; remote memory intact, recent memory impaired  Visuo-spatial:   not examined  Speech (Volume):  low  Speech:   normal; slurred  Thought Process:  Coherent and Relevant  Coherent  Though Content:  WNL; not suicidal and not homicidal  Orientation:   person, place, and situation  Judgment:   Fair  Planning:   Fair  Affect:    Blunted and Lethargic  Mood:    Dysphoric  Insight:   Fair  Intelligence:   normal  Psychiatric History:  Patient with past psychiatric history including depression and anxiety.  Patient continuing on his home medications.  Family Med/Psych History:  Family History  Problem Relation Age of Onset   Heart disease Mother    Esophageal cancer Neg Hx    Colon cancer Neg Hx    Impression/DX:   Richard [name] is a 68 year old male referred for neuropsychological consultation during his ongoing admission to the comprehensive inpatient rehabilitation unit due to issues with adjustment with extended hospital stay and past history of anxiety and depression and more recent episodes of psychosis.  Patient has a significant history for HIV and is maintained on antiretrovirals.  Patient with chronic kidney disease stage III, CAD/MI/CABG 2015, severe ischemic cardiomyopathy, compensated chronic systolic and systolic congestive heart failure, history of NSVT, hyperlipidemia and hypertension.  Patient presented on 12/01/2023 with left-sided weakness and dysarthria of unknown duration.  CT head did show subtle hypoattenuation involving the right caudate and right lentiform nuclei.  MRI follow-up did reveal moderate-sized acute infarct of the right MCA distribution.  After patient was stabilized and treated he was referred to CIR due to decreased functional mobility including left-sided weakness, dysarthria.  During today's clinical visit, the patient was oriented and aware that he was dealing with a cerebrovascular accident.  Patient was also aware  of previous right frontotemporal stroke and has been dealing with some cognitive issues from those.  Patient noted stability with his anxiety depression but he was struggling with extended hospital stay and concerns over his status.  Today we worked on coping and adjustment issues.  Patient denied any significant exacerbation of depression or anxiety and reports that his mood state is not having a negative deleterious impact on his capacity to participate in therapeutic interventions.  Disposition/Plan:  The patient should be discharged in a couple of days and overall he is coping adequately with the residual effects of his stroke and has made functional gains during his admission to CIR.  Patient will follow-up with his treating physicians regarding his ongoing care for depression and anxiety.  Diagnosis:    Anxiety with depression         Electronically Signed   _______________________ Chapman Commodore, Psy.D. Clinical Neuropsychologist

## 2023-12-28 DIAGNOSIS — G8929 Other chronic pain: Secondary | ICD-10-CM

## 2023-12-28 DIAGNOSIS — M545 Low back pain, unspecified: Secondary | ICD-10-CM

## 2023-12-28 MED ORDER — SORBITOL 70 % SOLN
15.0000 mL | Freq: Once | Status: DC
  Filled 2023-12-28: qty 30

## 2023-12-28 MED ORDER — METHOCARBAMOL 500 MG PO TABS
500.0000 mg | ORAL_TABLET | Freq: Four times a day (QID) | ORAL | Status: DC | PRN
  Administered 2023-12-31: 500 mg via ORAL
  Filled 2023-12-28: qty 1

## 2023-12-28 NOTE — Progress Notes (Signed)
 Physical Therapy Session Note  Patient Details  Name: Richard [name] MRN: [id number] Date of Birth: 08-26-1955  Today's Date: 12/28/2023 PT Individual Time: 0920-1000 PT Individual Time Calculation (min): 40 min   Short Term Goals: Week 3:  PT Short Term Goal 1 (Week 3): Pt will perform bed mobility with CGA/ MinA. PT Short Term Goal 2 (Week 3): Pt will perform squat pivot transfers with CGA. PT Short Term Goal 3 (Week 3): Pt will perform sit<>stand transfers with MinA. PT Short Term Goal 4 (Week 3): Pt will ambulate at least 30 ft using LRAD with MinA +2. PT Short Term Goal 5 (Week 3): Pt will initiate stair training.  Skilled Therapeutic Interventions/Progress Updates: Patient supine in bed with wife present on entrance to room. Patient alert and agreeable to PT session.   Patient reported no pain during session  Therapeutic Activity: Bed Mobility: Pt performed supine<sit on EOB with modA and cues to increase abdominal activation (pt initially donned personal pants with maxA up to thighs, then used R UE to donn around waist) from slightly sidelying position with B LE off of bed already. Pt sitting EOB with R UE support on foot of bed railing. Transfers: Pt performed squat pivot transfer from EOB<TIS with minA, and cues for head/hip relationship and pt use of far arm rest with R UE to pull self over.   Gait Training:  Pt ambulated roughly 71' using EVA walker with overall modA + 2 with tech on R, and PTA on L assisting with L LE advancement through gait cycle. Pt with max cuing to weight shift to R to increase ability to swing L LE (increased ability to do so following cue to bring knee to PTA hand to increase hip flexion), but still required modA to slide through (shoe cap and AFO donned). Pt also required maxA to obtain knee extension on L, and to maintain L glute activation (presentation of L hip protruding posteriorly to L). Pt given increased time to advance R LE through swing  phase as much as possible with as little intervention as possible.  Neuromuscular Re-ed: NMR facilitated during session with focus on neuromuscular connection/control of L glutes/quadriceps. - Standing hip extension (maintaining) with mini squat and tech pulling pt into extension to assist once cued to power up to stand (PTA providing maxA from mini squat position with tactile feedback to increase L glute activation). Originally started with band pulling pt into hip flexion to the L for pt to use as tactile feedback, but pt required heavy maxA and increased time to motor-plan, so deferred to band pulling into hip extension to the R to bias L glute activation. Pt performed until close to fatigue with B UE supported on EVA walker. Pt also required maxA to maintain L knee in extension throughout, and cues to "stand up tall" to increase hip extension/paraspinal extensor activation.  NMR performed for improvements in motor control and coordination, balance, sequencing, judgement, and self confidence/ efficacy in performing all aspects of mobility at highest level of independence.   Patient sitting in TIS at end of session with brakes locked, wife present, belt alarm set, and all needs within reach.      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

## 2023-12-28 NOTE — Progress Notes (Signed)
 Physical Therapy Session Note  Patient Details  Name: Richard [name] MRN: [id number] Date of Birth: 08/13/1955  Today's Date: 12/28/2023 PT Individual Time: 4098-1191 PT Individual Time Calculation (min): 47 min   Short Term Goals: Week 2:  PT Short Term Goal 1 (Week 2): Pt will perform bed mobility with MinA. PT Short Term Goal 1 - Progress (Week 2): Progressing toward goal PT Short Term Goal 2 (Week 2): Pt will perform squat pivot transfers with MinA. PT Short Term Goal 2 - Progress (Week 2): Progressing toward goal PT Short Term Goal 3 (Week 2): Pt will perform sit<>stand transfers with ModA. PT Short Term Goal 3 - Progress (Week 2): Met PT Short Term Goal 4 (Week 2): Pt will ambulate at least 30 ft using LRAD with ModA +2. PT Short Term Goal 4 - Progress (Week 2): Met PT Short Term Goal 5 (Week 2): Pt will initiate w/c mobility training. PT Short Term Goal 5 - Progress (Week 2): Met Week 3:  PT Short Term Goal 1 (Week 3): Pt will perform bed mobility with CGA/ MinA. PT Short Term Goal 2 (Week 3): Pt will perform squat pivot transfers with CGA. PT Short Term Goal 3 (Week 3): Pt will perform sit<>stand transfers with MinA. PT Short Term Goal 4 (Week 3): Pt will ambulate at least 30 ft using LRAD with MinA +2. PT Short Term Goal 5 (Week 3): Pt will initiate stair training.  Skilled Therapeutic Interventions/Progress Updates:  Patient supine in bed on entrance to room. Patient alert and agreeable to PT session. Wife present.   Patient with no pain complaint at start of session.  Therapeutic Activity/ NMR: Bed Mobility: Pt performed supine > sit with cues for sidelying technique. VC/ tc required for sequencing and performance to reach sidelying, then bringing BLE off EOB and pushing up to seated position. Requires 2 efforts  and assisted in maintaining trunk position while re-adjusting hand placement.  Transfers: Pt performed squat pivot with explanation to wife re: pt's continued  need for verbal guidance in sequencing for safety in order to setup body position prior to performance. With RLE in place and R hand to push from seat, pt then produces pivot in half stance and is able to reach seat of w/c in one effort. All with CGA toward R side. Sit<>stand transfers continue to require MinA for power up but then MaxA to maintain standing balance d/t L lean. Stand pivot transfer practiced during session with need for MaxA +1 for proper dequencing in technique requiring vc/ tc.   Also guided in attempt to maintain standing balance one reaching upright posture with only HHA to RUE from +2. Reduced awareness and decreased LLE activation leads to heavy L lean with buckle of L knee despite AFO donned. Requires MaxA to maintain balance. With vc for L eknee extension, he is able to perform but unable to utilize for balance.   NMR performed for improvements in motor control and coordination, balance, sequencing, judgement, and self confidence/ efficacy in performing all aspects of mobility at highest level of independence.   Wheelchair Mobility:  Pt propelled wheelchair 150' x1 using hemitechnique but pt able to maintain straight path easier with RLE only.   Gait Training:  Pt ambulated 35' x1/ 45' x1 using RUE HHA and MaxA around low back and to LLE. Wife providing w/c follow.  Demonstrated continued  improved trunk control but also continued forward flexion in trunk when fatigued. Also demos mild hip initiation for advancement  of LLE with step progression, continues to require MaxA to advance but can hold LLE in mild flexion for stance phase with no block to L knee. Provided vc/ tc for sequencing lateral hip shift, forward hip translation in swing phase on L and focus to position of foot. Facilitated forward hip translation to R side with vc for need to maintain hip position over foot placement to improve balance. Pt does demo ability to initiate improved forward hip translation and is also  able to demo short pause prior to advancing RLE in order to allow forward hip translation over LLE at start of stance phase. But is short lived and requires vc throughout.   Patient supine in bed per pt's request at end of session with brakes locked, bed alarm set, and all needs within reach. Wife present. Related pt's upcoming    Therapy Documentation Precautions:  Precautions Precautions: Fall, Other (comment) Precaution/Restrictions Comments: L hemiplegia with flaccidity, R pusher (can correct to pull with vc), dysarthria Splint/Cast - Date Prophylactic Dressing Applied (if applicable):  (prafo boot) Restrictions Weight Bearing Restrictions Per Provider Order: No  Pain: No pain related this session.   Therapy/Group: Individual Therapy  Donne Gage PT, DPT, CSRS 12/28/2023, 6:29 PM

## 2023-12-28 NOTE — Patient Care Conference (Signed)
 Inpatient RehabilitationTeam Conference and Plan of Care Update Date: 12/28/2023   Time: 1055 am    Patient Name: Richard [name]      Medical Record Number: [id number]  Date of Birth: [birth year] Sex: Male         Room/Bed: 4W25C/4W25C-01 Payor Info: Payor: [employer] / Plan: [facility] / Product Type: *No Product type* /    Admit Date/Time:  12/06/2023  1:00 PM  Primary Diagnosis:  Right middle cerebral artery stroke [facility])  Hospital Problems: Principal Problem:   Right middle cerebral artery stroke [facility]) Active Problems:   Chronic systolic congestive heart failure (HCC)   Depression with anxiety    Expected Discharge Date: Expected Discharge Date: 01/05/24  Team Members Present: Physician leading conference: Dr. Lylia Sand Social Worker Present: Adrianna Albee, LCSW Nurse Present: Jerene Monks, RN PT Present: Javan Messing, PT OT Present: Florina Husbands, OT SLP Present: Reggie Caper, SLP PPS Coordinator present : Jestine Moron, SLP     Current Status/Progress Goal Weekly Team Focus  Bowel/Bladder      incontinent episodes of bowel and bladder    Continent with toileting    Assess bowel and bladder q shift  Swallow/Nutrition/ Hydration   D3/thin - reports preference to use provale 5cc   supervision  tolerance of strategies, education to family, pharyngeal strengthening exercises    ADL's   UB dressing min A, LB dressing max A, U/LB bathing min A, toileting max A; squat pivot transfers min A (requires cues for safety and technique)   min A overall   L hemibody NRME, dynamic balance, activity tolerance, midline orientation, BADL retraining, L side attention, functional cognition, transitional movements, pt education // Barriers: pusher syndrom, L hemi, trunk control, L UE functional use    Mobility   Bed mobility = Min/ ModA, Transfers = MinA for sit<>stand and squat pivot, MaxA for standing balance, Ambulation = using HR in  hallway and requires MaxA for LLE advancement - no longer requires block to L knee, reduced push with RUE, supervision with w/c mobility, no stair training yet.   Min/ Mod A overall  Barriers: L hemipareisis, impulsiveness, decreased awareness/ attn /// Work On: NMR for L hemibody, progressing ambulation, sitting/ standing balance with focus on midline orientation and proprioception, motor control of LLE with progressing estim, pt education, will need to setup family educ this/ next week, wife will need a few days to here with pt and therapy staff    Communication   ~90% intelligibile at conversational level given supervision-minA   90% at conversational level given supervisionA   education on strategies and use in functional scenarios    Safety/Cognition/ Behavioral Observations  minA   supervision   memory, problem solving, emergent awareness, attention    Pain      N/a          Skin      N/a           Discharge Planning:  Will have wife come in for education to make sure can provide the care pt will need. 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 Patient limited by headaches, impulsivity.,decreased awareness,dysphagia  and left hemiparesis.   Patient on target to meet rehab goals: yes, currently patient needs minimal assistance with upper body care, max assistance with lower body care. Patient needs minimal assist with  transfers for sit to stand, squat and pivot and needing max assist for standing balance.Over goals at discharge are set for min/mod assistance.   *See Care Plan and progress notes for long and short-term goals.   Revisions to Treatment Plan:  Provale cup Change full supervision for meals to intermittent EMST E-stim to forearm and quad  Teaching Needs: Safety, medications, transfers, toileting, etc.    Current  Barriers to Discharge: Decreased caregiver support and Home enviroment access/layout  Possible Resolutions to Barriers: Family Education Home health follow-up DME: walker, W/C, TTB     Medical Summary Current Status: R MCA CVA, HA, abdominal discomfort/constipation, heart failure, CKD , GERD, dysphagia, , Anemia, elevated LFTs  Barriers to Discharge: Medical stability;Electrolyte abnormality;Renal Insufficiency/Failure  Barriers to Discharge Comments: R MCA CVA, HA, abdominal discomfort/constipation, heart failure, CKD , GERD, dysphagia, , Anemia, elevated LFTs Possible Resolutions to [employer] Focus: Recheck Dig level, laxatives, monitor LFts, monitor wt,       I attest that I was present, lead the team conference, and concur with the assessment and plan of the team.   Thaila Bottoms Gayo 12/28/2023, 1055 am

## 2023-12-28 NOTE — Plan of Care (Signed)
 Goals upgraded to reflect patients progress in tx Problem: RH Swallowing Goal: LTG Patient will consume least restrictive diet using compensatory strategies with assistance (SLP) Description: LTG:  Patient will consume least restrictive diet using compensatory strategies with assistance (SLP) Flowsheets (Taken 12/28/2023 0926) LTG: Pt Patient will consume least restrictive diet using compensatory strategies with assistance of (SLP): Modified Independent   Problem: RH Expression Communication Goal: LTG Patient will increase speech intelligibility (SLP) Description: LTG: Patient will increase speech intelligibility at word/phrase/conversation level with cues, % of the time (SLP) Flowsheets (Taken 12/28/2023 0926) LTG: Patient will increase speech intelligibility (SLP): Modified Independent

## 2023-12-28 NOTE — Progress Notes (Signed)
 Physical Therapy Session Note  Patient Details  Name: Richard [name] MRN: [id number] Date of Birth: 28-Feb-1956  Today's Date: 12/27/2023 PT Individual Time: 1132-1200 PT Individual Time Calculation (min): 28 min  Short Term Goals: Week 2:  PT Short Term Goal 1 (Week 2): Pt will perform bed mobility with MinA. PT Short Term Goal 1 - Progress (Week 2): Progressing toward goal PT Short Term Goal 2 (Week 2): Pt will perform squat pivot transfers with MinA. PT Short Term Goal 2 - Progress (Week 2): Progressing toward goal PT Short Term Goal 3 (Week 2): Pt will perform sit<>stand transfers with ModA. PT Short Term Goal 3 - Progress (Week 2): Met PT Short Term Goal 4 (Week 2): Pt will ambulate at least 30 ft using LRAD with ModA +2. PT Short Term Goal 4 - Progress (Week 2): Met PT Short Term Goal 5 (Week 2): Pt will initiate w/c mobility training. PT Short Term Goal 5 - Progress (Week 2): Met Week 3:  PT Short Term Goal 1 (Week 3): Pt will perform bed mobility with CGA/ MinA. PT Short Term Goal 2 (Week 3): Pt will perform squat pivot transfers with CGA. PT Short Term Goal 3 (Week 3): Pt will perform sit<>stand transfers with MinA. PT Short Term Goal 4 (Week 3): Pt will ambulate at least 30 ft using LRAD with MinA +2. PT Short Term Goal 5 (Week 3): Pt will initiate stair training.  Skilled Therapeutic Interventions/Progress Updates:  Patient supine in bed with HOB elevated and pt in uncomfortable position on entrance to room. Patient alert and agreeable to PT session.   Patient with no pain complaint at start of session.  Therapeutic Activity: Bed Mobility: Bed flattened for improved comfort for pt and instructed pt in turn to sidelying on L side. Requires Min A to complete to sidelying. Is able to use RLE to assist LLE off EOB. Assisted pt in pushing up to upright seated position on EOB, requires Min/ ModA to reach. VC/ tc throughout for technique.  Transfers: Pt performed squat pivot  to w/c with definite need to cue for pre-positioning prior to performance. Sit<>stand transfers throughout session with push from chair and quick grab to either HHA or steady hold to rail with MinA to perform. Continues to require MaxA and cues for maintaining balance in static stance. Provided vc/ tc for increasing LLE activation.  Gait Training:  Pt ambulated 35' x1/ 45' x1 using RUE HHA and MaxA around low back and to LLE. Wife providing w/c follow.  Demonstrated improved trunk control but continued forward flexion in trunk. Also demos mild hip initiation for advancement of LLE with step progression, continues to require MaxA to advance but can hold LLE in mild flexion for stance phase with no block to L knee. Provided vc/ tc for sequencing lateral hip shift, forward hip translation in swing phase on L and focus to position of foot. Very fatigued this session.    Patient performs squat pivot into TIS w/c in order to sit upright for lunch. Requires CGA with max cues and MinA for proper LE positioning and sequencing prior to performance. Seated with good positioning at end of session with brakes locked, belt alarm set, and all needs within reach.   Therapy Documentation Precautions:  Precautions Precautions: Fall, Other (comment) Precaution/Restrictions Comments: L hemiplegia with flaccidity, R pusher (can correct to pull with vc), dysarthria Splint/Cast - Date Prophylactic Dressing Applied (if applicable):  (prafo boot) Restrictions Weight Bearing Restrictions Per Provider Order:  No  Pain:  No pain related this session. Has been premedicated with Voltaren  gel to Bil knees.   Therapy/Group: Individual Therapy  Donne Gage PT, DPT, CSRS 12/27/2023, 4:56 PM

## 2023-12-28 NOTE — Progress Notes (Signed)
 Physical Therapy Session Note  Patient Details  Name: Richard [name] MRN: [id number] Date of Birth: Jul 25, 1956  Today's Date: 12/27/2023 PT Individual Time: 0802- 0850 PT Individual Time Calculation (min): 48 min   Short Term Goals: Week 2:  PT Short Term Goal 1 (Week 2): Pt will perform bed mobility with MinA. PT Short Term Goal 1 - Progress (Week 2): Progressing toward goal PT Short Term Goal 2 (Week 2): Pt will perform squat pivot transfers with MinA. PT Short Term Goal 2 - Progress (Week 2): Progressing toward goal PT Short Term Goal 3 (Week 2): Pt will perform sit<>stand transfers with ModA. PT Short Term Goal 3 - Progress (Week 2): Met PT Short Term Goal 4 (Week 2): Pt will ambulate at least 30 ft using LRAD with ModA +2. PT Short Term Goal 4 - Progress (Week 2): Met PT Short Term Goal 5 (Week 2): Pt will initiate w/c mobility training. PT Short Term Goal 5 - Progress (Week 2): Met Week 3:  PT Short Term Goal 1 (Week 3): Pt will perform bed mobility with CGA/ MinA. PT Short Term Goal 2 (Week 3): Pt will perform squat pivot transfers with CGA. PT Short Term Goal 3 (Week 3): Pt will perform sit<>stand transfers with MinA. PT Short Term Goal 4 (Week 3): Pt will ambulate at least 30 ft using LRAD with MinA +2. PT Short Term Goal 5 (Week 3): Pt will initiate stair training.  Skilled Therapeutic Interventions/Progress Updates:  Patient supine in bed on entrance to room. Patient alert and agreeable to PT session.   Patient with no pain complaint at start of session.  Therapeutic Activity: Bed Mobility: Pt assisted in donning pants. Is again able to demo bridge to assist with donning pants with ModA to bring LLE into hooklying. Pt then performed supine > sit with ModA with pt initially attempting to use abdominals with straight forward flexion. Guided pt in return to sidelying to L side and then bringing Bil feet off EOB and guiding RUE to push from bed surface to initiate rising up  to seated position on EOB. Does attempt dynamic push and requires MinA to attain and initially maintain until pt has RUE support to bed rail and EOB.  VC/ tc required for technique. Transfers: Pt performed squat pivot to R side to w/c from EOB with CGA. Sit<>stand transfers throughout session with Min/ ModA in attempt to have perform with limited assist from low w/c seat height. Squat pivot from mwc to TIS w/c with CGA. Provided vc/ tc for setup and positioning.  Gait Training:  Pt ambulated 55 ft using RUE HHA with +2 and requires ModA from therapist for maintaining upright stance, increasing weight shift to R side, facilitation of Bil forward hip translation and timing of L quad activation. Provided vc/ tc throughout for sequencing of above technique.   Neuromuscular Re-ed: NMR facilitated during session with focus on standing balance. Pt guided in sit<>stand training using foot rail of bed in RUE. Pt guided in rise to stand and lean into RUE with weight shift to RLE. Guided with facilitation initially and then pt able to perform with vc only. No pushing evident throughout. Does require vc for upright posturing throughout as pt tends to flex L knee and R knee flexes to match. But with RLE extension and vc for upright posture, pt is able to straighten LLE. NMR performed for improvements in motor control and coordination, balance, sequencing, judgement, and self confidence/ efficacy in performing all  aspects of mobility at highest level of independence.   Patient seated upright in TIS w/c at end of session with brakes locked, belt alarm set, and all needs within reach.   Therapy Documentation Precautions:  Precautions Precautions: Fall, Other (comment) Precaution/Restrictions Comments: L hemiplegia with flaccidity, R pusher (can correct to pull with vc), dysarthria Splint/Cast - Date Prophylactic Dressing Applied (if applicable):  (prafo boot) Restrictions Weight Bearing Restrictions Per Provider  Order: No General:   Vital Signs:   Pain:   Mobility:   Locomotion :    Trunk/Postural Assessment :    Balance:   Exercises:   Other Treatments:      Therapy/Group: Individual Therapy  Donne Gage PT, DPT, CSRS 12/27/2023, 4:55 PM

## 2023-12-28 NOTE — Progress Notes (Signed)
 Speech Language Pathology Weekly Progress and Session Note  Patient Details  Name: Richard [name] MRN: [id number] Date of Birth: [birth year]  Beginning of progress report period: Dec 21, 2023 End of progress report period: Dec 28, 2023  Today's Date: 12/28/2023 SLP Individual Time: 4098-1191 1130-1200 SLP Individual Time Calculation (min): 29 min 30 minutes  Short Term Goals: Week 3: SLP Short Term Goal 1 (Week 3): Patient will increase speech intelligibility to 90% with use of compensatory strategies given supervision multimodal A SLP Short Term Goal 1 - Progress (Week 3): Met SLP Short Term Goal 2 (Week 3): Patient will recall and utilize memory compensatory strategies given supervision multimodal A SLP Short Term Goal 2 - Progress (Week 3): Progressing toward goal SLP Short Term Goal 3 (Week 3): Patient will demonstrate problem solving skills during mildly complex functional situations given sup  multimodal A SLP Short Term Goal 3 - Progress (Week 3): Progressing toward goal SLP Short Term Goal 4 (Week 3): Patient will utilize compensatory strategies during consumption of least restrictive diet with sup multimodal A SLP Short Term Goal 4 - Progress (Week 3): Met    New Short Term Goals: Week 4: SLP Short Term Goal 1 (Week 4): STG = LTG due to ELOS  Weekly Progress Updates: Pt has made good gains and has met 2 of 4 STGs this reporting period due to improved dysphagia and speech intelligibility. Currently, patient continues to require supervision A for use of swallowing strategies during D3/thin liquid diet. Patient is currently reaching 90% intelligibility at the conversational level with supervision A for utilizing SLOP speech strategies. Patient currently continues to require minA for problem solving and memory, therefore did not meet these STGs this reporting period. POC updated to reflect current progress. Pt/family eduction ongoing. Pt would benefit from continued ST intervention  to maximize communication, dysphagia and cognition in order to maximize functional independence at d/c.    Intensity: Minumum of 1-2 x/day, 30 to 90 minutes Frequency: 3 to 5 out of 7 days Duration/Length of Stay: 5/29 Treatment/Interventions: Cognitive remediation/compensation;Cueing hierarchy;Dysphagia/aspiration precaution training;Functional tasks;Internal/external aids;Patient/family education;Speech/Language facilitation;Therapeutic Activities   Daily Session  Skilled Therapeutic Interventions:  Session 1:  Skilled therapy session focused on dysphagia goals. SLP facilitated session by reviewing dysphagia strategies and pharyngeal strengthening exercises. Patient utilized min verbal and visual A to complete x30 repetitions of CTAR (chin tuck against resistance) and x10 effortful swallows. Patient with no s/sx of aspiration during sips of thin liquids via 5cc provale and verbalizes preference for use. Patient left in bed with alarm set and call bell in reach. Wife present. Continue POC.  Session 2: Skilled therapy session focused on cognitive goals. Upon entrance, patient requesting transfer to bed. SLP transferred via steady. SLP facilitated session by providing minA for patient to complete calendar activity. Given written and verbalized instructions, patient was prompted to write activities on calendar in correct location. Patient required minA to follow complete direction and place activity in accurate space. Patient attended to both sides of the calendar with mod I. At the end of the session, patient independently recalled activities completed in tx sessions this Am for entry in his memory book. Patient left in bed with alarm set and call bell in reach. Continue POC.       Pain Session 1: None reported  Session 2: None reported   Therapy/Group: Individual Therapy  Skyler Dusing M.A., CCC-SLP 12/28/2023, 9:24 AM

## 2023-12-28 NOTE — Progress Notes (Signed)
 PROGRESS NOTE   Subjective/Complaints: Occasional mild abdominal pain is improved.   ROS: Patient denies fever, rash, sore throat, blurred vision, dizziness, nausea, vomiting, diarrhea, cough, shortness of breath or chest pain,   headache, or mood change.  + constipation  + lower back pain- reports has this intermittently before admission   Objective:   No results found.   Recent Labs    12/26/23 0557  WBC 4.7  HGB 12.9*  HCT 38.3*  PLT 158     Recent Labs    12/26/23 0557  NA 140  K 3.6  CL 112*  CO2 20*  GLUCOSE 87  BUN 22  CREATININE 1.71*  CALCIUM  9.2       Intake/Output Summary (Last 24 hours) at 12/28/2023 1057 Last data filed at 12/28/2023 0524 Gross per 24 hour  Intake 577 ml  Output 650 ml  Net -73 ml        Physical Exam: Vital Signs Blood pressure 100/70, pulse 65, temperature 97.8 F (36.6 C), temperature source Oral, resp. rate 18, height 5\' 10"  (1.778 m), weight 74.1 kg, SpO2 100%.   Constitutional: No distress . Vital signs reviewed. HEENT: NCAT, EOMI, oral membranes moist Neck: supple Cardiovascular: RRR without murmur. No JVD    Respiratory/Chest: CTA Bilaterally without wheezes or rales. Normal effort    GI/Abdomen: BS + normoactive, non-tender, non-distended, soft Ext: no clubbing, cyanosis, or edema Psych: pleasant and cooperative  Skin: No evidence of breakdown, no evidence of rash Neurologic: awake and alert . Answers questions. Neurological: Ox3- delayed responses, but appropriatemotor strength is 5/5 in right and 0/5 left  deltoid, bicep, tricep, trace grip, hip flexor, knee extensors, ankle dorsiflexor and plantar flexor.  DTR's 3+ on left. Flexor tone 1+/4 LUE and 1/4 LLE.  Sensory exam normal sensation to light touch right side and LLE absent LT LUE but feels pinch  Musculoskeletal: Full range of motion in all 4 extremities. No joint swelling No significant TTP left  thigh, no significant pain noted with left hip or knee ROM  Hip adductors are little tight LLE. No significant L spine TTP   Assessment/Plan: 1. Functional deficits which require 3+ hours per day of interdisciplinary therapy in a comprehensive inpatient rehab setting. Physiatrist is providing close team supervision and 24 hour management of active medical problems listed below. Physiatrist and rehab team continue to assess barriers to discharge/monitor patient progress toward functional and medical goals  Care Tool:  Bathing    Body parts bathed by patient: Left arm, Chest, Abdomen, Front perineal area, Buttocks, Right upper leg, Left upper leg, Face, Right lower leg, Left lower leg   Body parts bathed by helper: Right arm, Right lower leg, Left lower leg     Bathing assist Assist Level: Moderate Assistance - Patient 50 - 74%     Upper Body Dressing/Undressing Upper body dressing   What is the patient wearing?: Pull over shirt    Upper body assist Assist Level: Minimal Assistance - Patient > 75%    Lower Body Dressing/Undressing Lower body dressing      What is the patient wearing?: Pants, Underwear/pull up     Lower body assist Assist for  lower body dressing: Maximal Assistance - Patient 25 - 49%     Toileting Toileting    Toileting assist Assist for toileting: Total Assistance - Patient < 25%     Transfers Chair/bed transfer  Transfers assist  Chair/bed transfer activity did not occur: Safety/medical concerns  Chair/bed transfer assist level: Dependent - mechanical lift (stedy)     Locomotion Ambulation   Ambulation assist   Ambulation activity did not occur: Safety/medical concerns          Walk 10 feet activity   Assist  Walk 10 feet activity did not occur: Safety/medical concerns        Walk 50 feet activity   Assist Walk 50 feet with 2 turns activity did not occur: Safety/medical concerns         Walk 150 feet activity   Assist  Walk 150 feet activity did not occur: Safety/medical concerns         Walk 10 feet on uneven surface  activity   Assist Walk 10 feet on uneven surfaces activity did not occur: Safety/medical concerns         Wheelchair     Assist Is the patient using a wheelchair?: Yes Type of Wheelchair: Manual    Wheelchair assist level: Dependent - Patient 0%      Wheelchair 50 feet with 2 turns activity    Assist        Assist Level: Dependent - Patient 0%   Wheelchair 150 feet activity     Assist      Assist Level: Dependent - Patient 0%   Blood pressure 100/70, pulse 65, temperature 97.8 F (36.6 C), temperature source Oral, resp. rate 18, height 5\' 10"  (1.778 m), weight 74.1 kg, SpO2 100%.  Medical Problem List and Plan: 1. 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             -patient may  shower             -ELOS/Goals: 5/29 days Min A  -Continue CIR therapies including PT, OT, and SLP    PRAFO and WHO for LLE and LUE  -Team conference today please see physician documentation under team conference tab, met with team  to discuss problems,progress, and goals. Formulized individual treatment plan based on medical history, underlying problem and comorbidities.      2.  Antithrombotics: -DVT/anticoagulation: Eliquis              -antiplatelet therapy: ASA 81mg  daily 3. Pain Management/spasticity: Tylenol  as needed Post CVA HA resolved   5/11- Will add topamax  50 mg at bedtime 5/15 reduce topamax  to 25mg  and monitor  Bilateral knee pain , aching in am s- OA noted on Xrays in 2023 start diclofenac  gel   -emerging left sided tone.    -5/4 unable to tolerate even low dose baclofen --> stopped    -outpt botox candidate - Pain control overall with Tylenol , continue current regimen -5/19 told pt that some of his groin pain might be intermitent spasm of  his left hip adductors. Continue with ROM, activity with therapy.  -5/21 Will add PRN robaxin, occasional low back pain 4. Mood/Behavior/Sleep: Provide emotional support             -antipsychotic agents: N/A 5. Neuropsych/cognition: This patient is ?capable of making decisions on his own behalf. 6. Skin/Wound Care: Routine skin checks 7. Fluids/Electrolytes/Nutrition: Routine in and outs with follow-up chemistries  Intake of fluids improved to yesterday  8.  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  Followed by heart failure team.  Monitor for any signs of fluid overload.  Toprol  XL 12.5 mg daily.  Monitor for any hypotension  .vital Vitals:   12/27/23 2031 12/28/23 0500  BP: 98/63 100/70  Pulse: 71 65  Resp: 17 18  Temp: 98.5 F (36.9 C) 97.8 F (36.6 C)  SpO2: 100% 100%    5/19  BP remains soft off metoprolol ,HR mild brady to normal   5/20-21 BP a little Soft, HR stable, continue to monitor  Filed Weights   12/25/23 0500 12/26/23 0456 12/27/23 0620  Weight: 72.3 kg 71.8 kg 74.1 kg   Weight coming down   5/16 NO signs of overload, suspect wt today not accurate  5/17-20 weight stable today, no signs overload 9.  CAD/MI/CABG.  Follow-up cardiology services.  Currently maintained on aspirin  and Plavix  10.  VT.  Last episode noted 2023 with successful ATP. 11.  CKD stage IIIA=  Baseline creatinine 1.3-1.5.  Follow-up chemistries,stable  Creat  5/19 Cr up to 1.7 today, sl above baseline--encourage adequate fluids -Recheck tomorrrow    Latest Ref Rng & Units 12/26/2023    5:57 AM 12/19/2023    5:23 AM 12/13/2023    5:08 AM  BMP  Glucose 70 - 99 mg/dL 87  87    BUN 8 - 23 mg/dL 22  17    Creatinine 1.91 - 1.24 mg/dL 4.78  2.95  6.21   Sodium 135 - 145 mmol/L 140  139    Potassium 3.5 - 5.1 mmol/L 3.6  4.0    Chloride 98 - 111 mmol/L 112  108    CO2 22 - 32 mmol/L 20  21    Calcium  8.9 - 10.3 mg/dL 9.2  8.9      12.   HIV.  Continue Dovato - followed at the [facility] 13.  Hyperlipidemia.  Lipitor/Zetia  14.  GERD.  Pepcid  15.  Constipation.  MiraLAX  daily, Senokot changed to 2 po BID    LBM 5/17-   5/19 I reviewed kub with pt/wife. Mostly just gaseous distention   -he is feeling better today   -maalox gas if needed, increase miralax  to bid   -encouraged on going po intake   -keep bowels moving  5/20 Consider additional med if no BM today  5/21 Low dose sorbitol   16.  Dysphagia.  Patient notes coughing on current diet of mechanical soft.  Dysphagia 2 thin liquids and have speech therapy follow-up.   MBS and FEES looked ok   -now on dys 3/thin diet 17. Daily HA's since stroke- tylenol  + topamax  started 5/11, improved- try reducing dose to 25mg  and monitor   -5/16-20 HA controlled, continue current  18. Anemia  -5/19 stable 12.9 19, Elevated LFTs  -adjust tylenol  to q6h prn, recheck thurs  LOS: 22 days A FACE TO FACE EVALUATION WAS PERFORMED  Lylia Sand 12/28/2023, 10:57 AM

## 2023-12-28 NOTE — Progress Notes (Signed)
 Occupational Therapy Weekly Progress Note  Patient Details  Name: Richard [name] MRN: [id number] Date of Birth: [birth year]  Beginning of progress report period: Dec 21, 2023 End of progress report period: Jan 05, 2024  Today's Date: 12/28/2023 OT Individual Time: 1351-1445 OT Individual Time Calculation (min): 54 min    Patient has met 3 of 3 short term goals. Richard [name] is making appropriate progress towards reaching his LTGs. He is completing UB dressing with min A, LB dressing with mod/max A, toileting with max A, and U/LB bathing with min A. He is completing stand pivot or squat pivot transfers to toilet using grab bar with mod A to L and min A to L. He has 1/5 muscle activation in biceps, however flaccid throughout otherwise. He is tolerating sleeping in his resting hand splint and is demonstrating increased awareness of his L UE postioning. He is able to maintain static sitting balance with close supervision in preparation for transfers and is demonstrating improved recall of squat pivot and stand pivot transfer techniques. He is planning to d/c home with his wife who has started participating in family education sessions.   Patient continues to demonstrate the following deficits: muscle weakness and muscle joint tightness, decreased cardiorespiratoy endurance, impaired timing and sequencing, abnormal tone, unbalanced muscle activation, decreased coordination, and decreased motor planning, decreased attention to left and decreased motor planning, decreased attention, decreased awareness, and decreased safety awareness, central origin, and decreased sitting balance, decreased standing balance, decreased postural control, hemiplegia, and decreased balance strategies and therefore will continue to benefit from skilled OT intervention to enhance overall performance with BADL and Reduce care partner burden.  Patient progressing toward long term goals..  Continue plan of care.  OT Short  Term Goals Week 3:  OT Short Term Goal 1 (Week 3): Pt will maintain sitting balance in preparation for functional transfers with close supervision consistently OT Short Term Goal 1 - Progress (Week 3): Met OT Short Term Goal 2 (Week 3): Pt will complete one step of lower body dressing OT Short Term Goal 2 - Progress (Week 3): Met OT Short Term Goal 3 (Week 3): Pt will complete toilet transfers with mod A using LRAD OT Short Term Goal 3 - Progress (Week 3): Met Week 4:  OT Short Term Goal 1 (Week 4): STG=LTG d/t ELOS  Skilled Therapeutic Interventions/Progress Updates:     Pt received sitting up in bed, dressed and ready for the day with all BADL needs met. Pt presenting to be in good spirits receptive to skilled OT session reporting 0/10 pain- OT offering intermittent rest breaks, repositioning, and therapeutic support to optimize participation in therapy session. Focused this session on family education to increase Pt and caregiver safety at d/c. D/t level of assistance Pt will require at d/c, recommended Pt's wife participate in numerous family education sessions to optimize learning and safety at home. Spent time at beginning of session discussing home set-up and DME needs- Pt lives in single level home with 2-3 STE, however flat entrance at back of the home. Pt utilizes a tub shower, however bathrooms are very small per wife report. Recommended Pt use a DABSC and TTB at d/c to optimize safety with follow-up OPOT and 24/7 supervision with Pt and Pt's wife receptive to plan. Provided education on CVA etiology/recovery process, impact of CVA on Pt's functional status, and techniques for optimizing Pt's independence in ADLs. Education provided on L UE positioning, resting hand splint usage, and contracture prevention. Focused remainder of  session on transfer education with emphasis on squat pivots. Education provided on squat pivot technique with emphasis on head/hip relationship, body positioning,  caregiver body mechanics to avoid injury, and safety considerations. Demonstrated transfer technique in both directions x2 trails and then engaged Pt's wife in completing transfers for hands-on training. Pt's wife able to provided min A overall for Pt when completing transfers with OT providing verbal cues for technique and safety awareness- feedback provided for wife's body positioning and to avoid overly assisting Pt. Pt's wife demonstrating teach back as evidence of learning and presenting to be more confident following increased practice opportunities. Pt and Pt's wife would benefit from additional family education sessions. Pt was left resting in [facility] with call bell in reach, wife present in room, and all needs met.    Therapy Documentation Precautions:  Precautions Precautions: Fall, Other (comment) Precaution/Restrictions Comments: L hemiplegia with flaccidity, R pusher (can correct to pull with vc), dysarthria Splint/Cast - Date Prophylactic Dressing Applied (if applicable):  (prafo boot) Restrictions Weight Bearing Restrictions Per Provider Order: No   Therapy/Group: Individual Therapy  Richard [name] 12/28/2023, 1:02 PM

## 2023-12-29 LAB — COMPREHENSIVE METABOLIC PANEL WITH GFR
ALT: 66 U/L — ABNORMAL HIGH (ref 0–44)
AST: 26 U/L (ref 15–41)
Albumin: 3.2 g/dL — ABNORMAL LOW (ref 3.5–5.0)
Alkaline Phosphatase: 49 U/L (ref 38–126)
Anion gap: 11 (ref 5–15)
BUN: 19 mg/dL (ref 8–23)
CO2: 19 mmol/L — ABNORMAL LOW (ref 22–32)
Calcium: 9.2 mg/dL (ref 8.9–10.3)
Chloride: 110 mmol/L (ref 98–111)
Creatinine, Ser: 1.61 mg/dL — ABNORMAL HIGH (ref 0.61–1.24)
GFR, Estimated: 47 mL/min — ABNORMAL LOW (ref >60)
Glucose, Bld: 78 mg/dL (ref 70–99)
Potassium: 3.9 mmol/L (ref 3.5–5.1)
Sodium: 140 mmol/L (ref 135–145)
Total Bilirubin: 0.7 mg/dL (ref 0.0–1.2)
Total Protein: 6.6 g/dL (ref 6.5–8.1)

## 2023-12-29 NOTE — Progress Notes (Signed)
 Occupational Therapy Session Note  Patient Details  Name: Richard [name] MRN: [id number] Date of Birth: 02-03-1956  Today's Date: 12/29/2023 OT Individual Time: 1131-1159 OT Individual Time Calculation (min): 28 min  OT Individual Time: 1308-6578 OT Individual Time Calculation (min): 57 min   Short Term Goals: Week 4:  OT Short Term Goal 1 (Week 4): STG=LTG d/t ELOS  Skilled Therapeutic Interventions/Progress Updates:     AM Session:  Pt received reclined in bed presenting to be in good spirits receptive to skilled OT session reporting 0/10 pain- OT offering intermittent rest breaks, repositioning, and therapeutic support to optimize participation in therapy session. Pt dressed and ready for the day upon OT arrival with all BADL needs met. Focused this session on postural alignment and L UE NMRE. Flattened Pt's bed to simulate home environment. Pt able to transitioned to EOB with CGA to fully lift trunk +verbal cues for technique. Sitting EOB, engaged Pt in completing slowed prolonged stretch of L UE to decreased tone in elbow with noted improvement following. Guided Pt through completing lateral neck flexion, A/P neck flexion/extension, chest openers, trunk rotation, and scapular retraction to improve trunk positioning and alignment in preparation for L UE NMRE exercises. Engaged Pt in working through [employer] elbow flex/extension, scapular protraction/retraction, and shoulder abd/adduction with OT providing assist and light resistance for increased proprioceptive feedback. Pt with 2-/5 at triceps with increased challenges noted moving through full elbow extension d/t tone in biceps. Squat pivot to R EOB > TISWC CGA +min verbal cues for technique. Pt was left resting in [facility] with call bell in reach, seatbelt alarm on, and all needs met.    PM Session:  Pt received sitting up in [facility] presenting to be in good spirits receptive to skilled OT session reporting 0/10 pain- OT offering intermittent  rest breaks, repositioning, and therapeutic support to optimize participation in therapy session. Focused this session on functional transfer training to increase overall safety and independence at d/c. Set-up bed and BSC to simulate home environment. Provided education on squat pivot technique when transferring from wc or EOB <> BSC with focus on set-up, technique, and safety considerations with Pt receptive to education. Engaged Pt in completing blocked practice of squat pivots EOB <> BSC and BSC <> wc. Pt completed transfers with CGA to light min A overall when transferring to the R- mod verbal cues required for Pt to avoid pulling/pushing too hard on DABSC arm rest as it is a hazard for tipping over BSC. When transferring to the L, heavy min A required +increased verbal cues required for set-up and body positioning. Following education, engaging Pt in "coaching" OT through technique for assisting with transfer to simulate Pt guiding his care with wife at d/c. Pt demonstrated appropriate carryover overall, however he did require min questioning cues to recall all aspects of set-up for transfer when instructing therapist- would benefit from additional practice opportunities to support learning. Pt requesting to use restroom during session with 3/3 toileting tasks completed on BSC with max A, however Pt with improved participation in task by brining pants to knees in sitting position and maintaining standing balance with CGA-min A while OT assisted with clothing. Pt fatigued at end of session, requesting to return to bed. EOB > supine CGA +verbal cues for technique. Pt was left resting in bed with call bell in reach, bed alarm on, and all needs met.    Therapy Documentation Precautions:  Precautions Precautions: Fall, Other (comment) Precaution/Restrictions Comments: L hemiplegia with flaccidity,  R pusher (can correct to pull with vc), dysarthria Splint/Cast - Date Prophylactic Dressing Applied (if  applicable):  (prafo boot) Restrictions Weight Bearing Restrictions Per Provider Order: No   Therapy/Group: Individual Therapy  Geoffery Kiel 12/29/2023, 8:03 AM

## 2023-12-29 NOTE — Progress Notes (Addendum)
 Patient ID: Richard [name], male   DOB: 1956/01/11, 68 y.o.   MRN: [id number] Met with pt and spoke with wife via telephone to give team conference update progress this week and the need for education with wife to be here to learn his care. She has observed but has not done hands on care. Have scheduled her for Tuesday and Wed from 9;00-12:00. Will try to order DME today [facility] to [facility]. Continue to work on discharge needs.  2:53 PM Have emailed DME orders to [facility] lia[facility]-wheelchair, drop arm bedside commode and tub bench along with speciality wheelchair evaluation order. Made aware discharge set for 5/29. Will await confirmation from cassie.

## 2023-12-29 NOTE — Progress Notes (Signed)
 PROGRESS NOTE   Subjective/Complaints: Patient has intermittent low back pain-not particular bothersome currently.  LBM yesterday.  ROS: Patient denies fever, rash, sore throat, blurred vision, dizziness, nausea, vomiting, diarrhea, cough, shortness of breath or chest pain,   headache, or mood change.  + constipation - improved  + lower back pain- reports has this intermittently before admission   Objective:   No results found.   No results for input(s): "WBC", "HGB", "HCT", "PLT" in the last 72 hours.    Recent Labs    12/29/23 0545  NA 140  K 3.9  CL 110  CO2 19*  GLUCOSE 78  BUN 19  CREATININE 1.61*  CALCIUM  9.2       Intake/Output Summary (Last 24 hours) at 12/29/2023 1624 Last data filed at 12/29/2023 1326 Gross per 24 hour  Intake 356 ml  Output 450 ml  Net -94 ml        Physical Exam: Vital Signs Blood pressure 114/74, pulse 77, temperature 98.1 F (36.7 C), temperature source Oral, resp. rate 17, height 5\' 10"  (1.778 m), weight 73 kg, SpO2 100%.   Constitutional: No distress . Vital signs reviewed. Laying in bed HEENT: NCAT, EOMI, oral membranes moist Neck: supple Cardiovascular: RRR without murmur. No JVD    Respiratory/Chest: CTA Bilaterally without wheezes or rales. Normal effort  On RA GI/Abdomen: BS + normoactive, non-tender, non-distended, soft Ext: no clubbing, cyanosis, or edema Psych: pleasant and cooperative  Skin: No evidence of breakdown, no evidence of rash Neurologic: awake and alert . Answers questions. Neurological: Ox3- delayed responses, but appropriate motor strength is 5/5 in right and 0/5 left  deltoid, bicep, tricep, trace grip, hip flexor, knee extensors, ankle dorsiflexor and plantar flexor.  DTR's 3+ on left. Flexor tone 1+/4 LUE and 1/4 LLE.  Sensory exam normal sensation to light touch right side and LLE absent LT LUE but feels pinch  Musculoskeletal: Full range  of motion in all 4 extremities. No joint swelling No significant L spine TTP   Assessment/Plan: 1. Functional deficits which require 3+ hours per day of interdisciplinary therapy in a comprehensive inpatient rehab setting. Physiatrist is providing close team supervision and 24 hour management of active medical problems listed below. Physiatrist and rehab team continue to assess barriers to discharge/monitor patient progress toward functional and medical goals  Care Tool:  Bathing    Body parts bathed by patient: Left arm, Chest, Abdomen, Front perineal area, Buttocks, Right upper leg, Left upper leg, Face, Right lower leg, Left lower leg   Body parts bathed by helper: Right arm, Right lower leg, Left lower leg     Bathing assist Assist Level: Moderate Assistance - Patient 50 - 74%     Upper Body Dressing/Undressing Upper body dressing   What is the patient wearing?: Pull over shirt    Upper body assist Assist Level: Minimal Assistance - Patient > 75%    Lower Body Dressing/Undressing Lower body dressing      What is the patient wearing?: Pants, Underwear/pull up     Lower body assist Assist for lower body dressing: Maximal Assistance - Patient 25 - 49%     Toileting Toileting  Toileting assist Assist for toileting: Total Assistance - Patient < 25%     Transfers Chair/bed transfer  Transfers assist  Chair/bed transfer activity did not occur: Safety/medical concerns  Chair/bed transfer assist level: Minimal Assistance - Patient > 75% (squat pivot to R with CGA; to L with MinA.)     Locomotion Ambulation   Ambulation assist   Ambulation activity did not occur: Safety/medical concerns  Assist level: Maximal Assistance - Patient 25 - 49% Assistive device: Hand held assist Max distance: 70 ft   Walk 10 feet activity   Assist  Walk 10 feet activity did not occur: Safety/medical concerns  Assist level: Maximal Assistance - Patient 25 - 49% Assistive  device: Other (comment) (underarm support on L and HHA on R)   Walk 50 feet activity   Assist Walk 50 feet with 2 turns activity did not occur: Safety/medical concerns  Assist level: Maximal Assistance - Patient 25 - 49% Assistive device: Other (comment) (underarm support on L and HHA on R)    Walk 150 feet activity   Assist Walk 150 feet activity did not occur: Safety/medical concerns         Walk 10 feet on uneven surface  activity   Assist Walk 10 feet on uneven surfaces activity did not occur: Safety/medical concerns         Wheelchair     Assist Is the patient using a wheelchair?: Yes Type of Wheelchair: Manual    Wheelchair assist level: Dependent - Patient 0%      Wheelchair 50 feet with 2 turns activity    Assist        Assist Level: Dependent - Patient 0%   Wheelchair 150 feet activity     Assist      Assist Level: Dependent - Patient 0%   Blood pressure 114/74, pulse 77, temperature 98.1 F (36.7 C), temperature source Oral, resp. rate 17, height 5\' 10"  (1.778 m), weight 73 kg, SpO2 100%.  Medical Problem List and Plan: 1. 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             -patient may  shower             -ELOS/Goals: 5/29 days Min A  -Continue CIR therapies including PT, OT, and SLP    PRAFO and WHO for LLE and LUE  -Expected discharge 5/29     2.  Antithrombotics: -DVT/anticoagulation: Eliquis              -antiplatelet therapy: ASA 81mg  daily 3. Pain Management/spasticity: Tylenol  as needed Post CVA HA resolved   5/11- Will add topamax  50 mg at bedtime 5/15 reduce topamax  to 25mg  and monitor  Bilateral knee pain , aching in am s- OA noted on Xrays in 2023 start diclofenac  gel   -emerging left sided tone.    -5/4 unable to tolerate even low dose baclofen --> stopped    -outpt botox candidate - Pain control  overall with Tylenol , continue current regimen -5/19 told pt that some of his groin pain might be intermitent spasm of his left hip adductors. Continue with ROM, activity with therapy.  -5/21 Will add PRN robaxin, occasional low back pain  5/22 not frequently using as needed medication 4. Mood/Behavior/Sleep: Provide emotional support             -antipsychotic agents: N/A 5. Neuropsych/cognition: This patient is ?capable of making decisions on his own  behalf. 6. Skin/Wound Care: Routine skin checks 7. Fluids/Electrolytes/Nutrition: Routine in and outs with follow-up chemistries Intake of fluids improved to yesterday  8.  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  Followed by heart failure team.  Monitor for any signs of fluid overload.  Toprol  XL 12.5 mg daily.  Monitor for any hypotension  .vital Vitals:   12/29/23 0430 12/29/23 1325  BP: 99/72 114/74  Pulse: 65 77  Resp: 17 17  Temp: 98.4 F (36.9 C) 98.1 F (36.7 C)  SpO2: 100% 100%    5/19  BP remains soft off metoprolol ,HR mild brady to normal   5/22 BP stable/little soft, continue current regimen and monitor Filed Weights   12/26/23 0456 12/27/23 0620 12/29/23 0431  Weight: 71.8 kg 74.1 kg 73 kg   Weight coming down   5/16 NO signs of overload, suspect wt today not accurate  5/17-20 weight stable today, no signs overload 9.  CAD/MI/CABG.  Follow-up cardiology services.  Currently maintained on aspirin  and Plavix  10.  VT.  Last episode noted 2023 with successful ATP. 11.  CKD stage IIIA=  Baseline creatinine 1.3-1.5.  Follow-up chemistries,stable  Creat  5/19 Cr up to 1.7 today, sl above baseline--encourage adequate fluids 5/22 creatinine/BUN a little improved to 1.61/19    Latest Ref Rng & Units 12/29/2023    5:45 AM 12/26/2023    5:57 AM 12/19/2023    5:23 AM  BMP  Glucose 70 - 99 mg/dL 78  87  87   BUN 8 - 23 mg/dL 19  22  17    Creatinine 0.61 - 1.24 mg/dL 6.60   6.30  1.60   Sodium 135 - 145 mmol/L 140  140  139   Potassium 3.5 - 5.1 mmol/L 3.9  3.6  4.0   Chloride 98 - 111 mmol/L 110  112  108   CO2 22 - 32 mmol/L 19  20  21    Calcium  8.9 - 10.3 mg/dL 9.2  9.2  8.9     12.  HIV.  Continue Dovato - followed at the [facility] 13.  Hyperlipidemia.  Lipitor/Zetia  14.  GERD.  Pepcid  15.  Constipation.  MiraLAX  daily, Senokot changed to 2 po BID    LBM 5/17-   5/19 I reviewed kub with pt/wife. Mostly just gaseous distention   -he is feeling better today   -maalox gas if needed, increase miralax  to bid   -encouraged on going po intake   -keep bowels moving  5/20 Consider additional med if no BM today  5/21 Low dose sorbitol    5/22 LBM yesterday improved 16.  Dysphagia.  Patient notes coughing on current diet of mechanical soft.  Dysphagia 2 thin liquids and have speech therapy follow-up.   MBS and FEES looked ok   -now on dys 3/thin diet 17. Daily HA's since stroke- tylenol  + topamax  started 5/11, improved- try reducing dose to 25mg  and monitor   -5/16-20 HA controlled, continue current  18. Anemia  -5/19 stable 12.9 19, Elevated LFTs  -adjust tylenol  to q6h prn, recheck thurs  - 5/22 LFTs improving  LOS: 23 days A FACE TO FACE EVALUATION WAS PERFORMED  Lylia Sand 12/29/2023, 4:24 PM

## 2023-12-29 NOTE — Progress Notes (Signed)
 Speech Language Pathology Daily Session Note  Patient Details  Name: Richard [name] MRN: [id number] Date of Birth: 1956/03/09  Today's Date: 12/29/2023 SLP Individual Time: 1001-1059 SLP Individual Time Calculation (min): 58 min  Short Term Goals: Week 4: SLP Short Term Goal 1 (Week 4): STG = LTG due to ELOS  Skilled Therapeutic Interventions: Skilled therapy session focused on cognition and dysphagia goals.  SLP targeted dysphagia goals through providing supervisionA for patient to complete x30 CTARS and x10 effortful swallows. SLP targeted cognition through review of WRAP memory strategies and use of memory book for todays session and prior PT session. Patient independently transcribed information in memory book this date wih encouragement from SLP and recalled 3/4 memory strategies (remaining with minA). SLP continued to target cognitive goals through problem solving task involving use of a calendar and time word problems in which patient required min verbal cues and mod visual cues for simple mathematics. Patient left in bed with alarm set and call bell in reach. Continue POC  Pain Pain in buttocks, SLP transferred to bed using steady   Therapy/Group: Individual Therapy  Hymen Arnett M.A., CCC-SLP 12/29/2023, 7:38 AM

## 2023-12-29 NOTE — Progress Notes (Signed)
 Physical Therapy Session Note  Patient Details  Name: Richard [name] MRN: [id number] Date of Birth: Dec 19, 1955  Today's Date: 12/29/2023 PT Individual Time: 0920-0959 PT Individual Time Calculation (min): 39 min   Short Term Goals: Week 3:  PT Short Term Goal 1 (Week 3): Pt will perform bed mobility with CGA/ MinA. PT Short Term Goal 2 (Week 3): Pt will perform squat pivot transfers with CGA. PT Short Term Goal 3 (Week 3): Pt will perform sit<>stand transfers with MinA. PT Short Term Goal 4 (Week 3): Pt will ambulate at least 30 ft using LRAD with MinA +2. PT Short Term Goal 5 (Week 3): Pt will initiate stair training.  Skilled Therapeutic Interventions/Progress Updates:      Pt presents in bed with direct handoff of care from NT who was assisting cleaning patient. Patient completed UB/LB dressing at bed level for time management. MaxA provided for time management as well. Donned socks/shoes/AFO with totalA.   Supine<>sitting EOB with modA for trunk support and managing BLE off EOB. Able to complete squat pivot transfer with min/modA towards his stronger R side - good clearance and pivoting of the hips to fully get into the wheelchair. Transported to main gym.   Introduced Museum/gallery curator using 6" steps and 1 HR on his R. Educated on sequencing and technique while forward facing and step-to pattern. He navigated x4 + x4 stairs (seated rest break) stairs at modA level for ascent with assist for bringing his L foot up to the next stair. Blocking his L knee for hyperextension and buckling. Pt required maxA for descending the stairs with more cueing needed for lateral weight shifting.   Gait training using 3-muskateers technique 43ft with modA +2 with assist for keeping LLE abducting to prevent narrow BOS. Also needed moderate cueing for "kicking" his LLE to encourage larger step length on L. Pt did well!  Returned to his room at end of session and was left sitting up in wheelchair with  needs met. Call bell and cell phone in reach.    Therapy Documentation Precautions:  Precautions Precautions: Fall, Other (comment) Precaution/Restrictions Comments: L hemiplegia with flaccidity, R pusher (can correct to pull with vc), dysarthria Splint/Cast - Date Prophylactic Dressing Applied (if applicable):  (prafo boot) Restrictions Weight Bearing Restrictions Per Provider Order: No General:      Therapy/Group: Individual Therapy  Pheobe Brass 12/29/2023, 7:57 AM

## 2023-12-30 DIAGNOSIS — I959 Hypotension, unspecified: Secondary | ICD-10-CM

## 2023-12-30 MED ORDER — TOPIRAMATE 25 MG PO TABS
25.0000 mg | ORAL_TABLET | Freq: Every day | ORAL | Status: DC
  Administered 2023-12-30: 25 mg via ORAL
  Filled 2023-12-30: qty 1

## 2023-12-30 NOTE — Progress Notes (Signed)
 Physical Therapy Session Note  Patient Details  Name: Richard [name] MRN: [id number] Date of Birth: [birth year]  Today's Date: 12/30/2023 PT Individual Time: 1302-1403 PT Individual Time Calculation (min): 61 min   Short Term Goals: Week 2:  PT Short Term Goal 1 (Week 2): Pt will perform bed mobility with MinA. PT Short Term Goal 1 - Progress (Week 2): Progressing toward goal PT Short Term Goal 2 (Week 2): Pt will perform squat pivot transfers with MinA. PT Short Term Goal 2 - Progress (Week 2): Progressing toward goal PT Short Term Goal 3 (Week 2): Pt will perform sit<>stand transfers with ModA. PT Short Term Goal 3 - Progress (Week 2): Met PT Short Term Goal 4 (Week 2): Pt will ambulate at least 30 ft using LRAD with ModA +2. PT Short Term Goal 4 - Progress (Week 2): Met PT Short Term Goal 5 (Week 2): Pt will initiate w/c mobility training. PT Short Term Goal 5 - Progress (Week 2): Met Week 3:  PT Short Term Goal 1 (Week 3): Pt will perform bed mobility with CGA/ MinA. PT Short Term Goal 2 (Week 3): Pt will perform squat pivot transfers with CGA. PT Short Term Goal 3 (Week 3): Pt will perform sit<>stand transfers with MinA. PT Short Term Goal 4 (Week 3): Pt will ambulate at least 30 ft using LRAD with MinA +2. PT Short Term Goal 5 (Week 3): Pt will initiate stair training.  Skilled Therapeutic Interventions/Progress Updates:  Patient seated upright in TIS w/c on entrance to room. Patient alert and agreeable to PT session.  Wife present.   Patient with no pain complaint at start of session.  Therapeutic Activity: Transfers: Pt performed squat pivot transfers with wife providing assist. W/c <> ADL bed with pt initially not providing much assist. PT informed pt that he needs to perform with all activation that he can provide in order to assist wife as well as to continue to progress abilities.   Sit<>stand transfers throughout session with MinA/ CGA. Provided vc/ tc for sequencing  and preparatory positioning. Educated wife on pt's positioning.  Bed Mobility: Pt performed supine <> sit with no AD or bedrails to bed in ADL apartment. No assist to position into supine, but then requires MinA/ CGA to return to upright seated position. Discussed potential need for railing. Looked up options on computer to demo to pt's wife.  VC/ tc required for technique.  Wheelchair Mobility:  18" wide standard w/c acquired for pt as this is closer to what he will be going home in. Pt propelled wheelchair 100 feet with supervision/ CGA. With change in build of chair and move to standard from ultrlightweight chair, pt does demo light difficulty at first but is able to hemi-propel, Provided vc/ tc for using light RUE assist to RLE.  Neuromuscular Re-ed: NMR facilitated during session with focus on standing balance. Motor control. Pt guided in sit<>stand practice again to no AD. With fatigue, does require additional assist to find balance with vc for LLE activation into straight leg hold. Does continue to demo L lean but requires vc/ light tc throughout for lateral weight shift, holding L knee extension, upright posture. Unable to hold balance with no physical assist for up to 7 sec. Initially requires Min/ ModA to find balance.    NMR performed for improvements in motor control and coordination, balance, sequencing, judgement, and self confidence/ efficacy in performing all aspects of mobility at highest level of independence.   Patient supine in  bed at end of session with brakes locked, bed alarm set, and all needs within reach.   Therapy Documentation Precautions:  Precautions Precautions: Fall, Other (comment) Precaution/Restrictions Comments: L hemiplegia, RUE flaccid, HIV(+), pacemaker, dysarthria Splint/Cast - Date Prophylactic Dressing Applied (if applicable):  (prafo boot) Restrictions Weight Bearing Restrictions Per Provider Order: No  Pain:  No pain related this session.     Therapy/Group: Individual Therapy  Donne Gage PT, DPT, CSRS 12/30/2023, 4:54 PM

## 2023-12-30 NOTE — Progress Notes (Signed)
 Physical Therapy Session Note  Patient Details  Name: Richard [name] MRN: [id number] Date of Birth: 07-29-1956  Today's Date: 12/30/2023 PT Individual Time: 1131-1204 PT Individual Time Calculation (min): 33 min   Short Term Goals: Week 2:  PT Short Term Goal 1 (Week 2): Pt will perform bed mobility with MinA. PT Short Term Goal 1 - Progress (Week 2): Progressing toward goal PT Short Term Goal 2 (Week 2): Pt will perform squat pivot transfers with MinA. PT Short Term Goal 2 - Progress (Week 2): Progressing toward goal PT Short Term Goal 3 (Week 2): Pt will perform sit<>stand transfers with ModA. PT Short Term Goal 3 - Progress (Week 2): Met PT Short Term Goal 4 (Week 2): Pt will ambulate at least 30 ft using LRAD with ModA +2. PT Short Term Goal 4 - Progress (Week 2): Met PT Short Term Goal 5 (Week 2): Pt will initiate w/c mobility training. PT Short Term Goal 5 - Progress (Week 2): Met Week 3:  PT Short Term Goal 1 (Week 3): Pt will perform bed mobility with CGA/ MinA. PT Short Term Goal 2 (Week 3): Pt will perform squat pivot transfers with CGA. PT Short Term Goal 3 (Week 3): Pt will perform sit<>stand transfers with MinA. PT Short Term Goal 4 (Week 3): Pt will ambulate at least 30 ft using LRAD with MinA +2. PT Short Term Goal 5 (Week 3): Pt will initiate stair training.  Skilled Therapeutic Interventions/Progress Updates:  Patient seated upright in w/c on entrance to room. Patient alert and agreeable to PT session.   Patient with no pain complaint at start of session.  Therapeutic Activity: Transfers: Pt performed sit<>stand transfers with CGA to rise to stand but continues to require mod/ MaxA to maintain balance upon reaching upright stance. Stand pivot transfers during session with extensive cueing for sequencing of pivot stepping. Squat pivot transfers with CGA/ MinA.   Gait Training:  Pt ambulated 24' x1 using R HHA with +2 and ModA from therapist for completing LLE  advancement and positioning. Demonstrated ability to initiate hip flexion with minimal movement to complete L step advancement. With compensatory backward trunk lean, pt has ability to intermittently complete with no assist to LE. Continues to require vc throughout for sequencing lateral weight shift to R, upright posture.   Neuromuscular Re-ed: NMR facilitated during session with focus on standing balance. Pt guided in blocked practice of rise to stand with focus on preparatory positioning including L foot placement, forward scoot, then lean. With pt corrected LE activation and lateral weight shift to midline, as well as vc for upright posture, he is able to maintain standing posture with close supervision for at least 20 sec. NMR performed for improvements in motor control and coordination, balance, sequencing, judgement, and self confidence/ efficacy in performing all aspects of mobility at highest level of independence.    Patient seated upright in w/c at end of session with brakes locked, belt alarm set, and all needs within reach.   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   Therapy/Group: Individual Therapy  Donne Gage PT, DPT, CSRS 12/30/2023, 4:50 PM

## 2023-12-30 NOTE — Progress Notes (Signed)
 Speech Language Pathology Daily Session Note  Patient Details  Name: [name] MRN: [id number] Date of Birth: 1956/08/01  Today's Date: 12/30/2023 SLP Individual Time: 1000-1059 SLP Individual Time Calculation (min): 59 min  Short Term Goals: Week 4: SLP Short Term Goal 1 (Week 4): STG = LTG due to ELOS  Skilled Therapeutic Interventions: Skilled therapy session focused on cognitive and dysphagia goals. SLP facilitated session by providing supervisionA for patient to complete x30 repetitions of chin tucks against resistance and x10 effortful swallows. SLP targeted cognitive goals through hospital navigation task. Given written locations, patient was prompted to utilize hospital signage to find the gift shop, cafeteria and main entrance. Patient located each destination with supervision-minA. Upon return to room, patient recalled broad details about each location with supervisionA. Patient left in [facility] with alarm set and call bell in reach. Continue POC  Pain Pain Assessment Pain Scale: 0-10 Pain Score: 0-No pain  Therapy/Group: Individual Therapy  Jerimey Burridge F Caidyn Blossom 12/30/2023, 10:59 AM

## 2023-12-30 NOTE — Progress Notes (Signed)
 Occupational Therapy Session Note  Patient Details  Name: Richard [name] MRN: [id number] Date of Birth: 06/05/1956  Today's Date: 12/30/2023 OT Individual Time: 0803-0900 OT Individual Time Calculation (min): 57 min    Short Term Goals: Week 4:  OT Short Term Goal 1 (Week 4): STG=LTG d/t ELOS  Skilled Therapeutic Interventions/Progress Updates:     Pt received reclined in bed presenting to be in good spirits receptive to skilled OT session reporting 0/10 pain- OT offering intermittent rest breaks, repositioning, and therapeutic support to optimize participation in therapy session. Pt requesting to take shower this AM- focused this session on BADL retraining and functional transfer training to increase overall independence and safety at d/c and to decrease bourdon of care.   Pt transitioned to EOB with close supervision +increased time using bed rails with HOB elevated. Squat pivot EOB > wc to R CGA +verbal cues for pacing and set-up.   Transported Pt total A into bathroom in wc. Pt completed squat pivot transfer wc <> DABSC with min A for balance and to fully guide hips with verbal cues provided for technique and initiation. Pt able to standing with MIN A using grab bar while OT assisted with doffing soiled brief. MAX A required for toileting tasks, however Pt able to participate by maintaining balance and initiating peri-care.   Squat pivot wc <> TTB using grab bar min A +verbal cues for pacing and body alignment- Pt initiating transfer prior to OT readiness requiring continued verbal cues for safety. Pt able to complete UB bathing MIN A to wash R UE utilizing HOH technique to support improved motor learning and LB bathing with min A to wash buttocks- Pt able to initiate washing buttocks by completing lateral leans and standing with grab bar MIN A with OT assisted with washing to ensure cleanliness.   U/LB dressing completed seated in w/c using hemi-technique with min questioning cues  required to initiate using techniques and for pacing. Pt donned shirt with min A for weaving L UE and pants with mod A to weave L LE and fully bring pants to waist in standing- Pt able to maintain standing balance with min A with R UE supported on grab bar and verbal cues for body alignment.   Pt was left resting in [facility] with call bell in reach, seatbelt alarm on, and all needs met.    Therapy Documentation Precautions:  Precautions Precautions: Fall, Other (comment) Precaution/Restrictions Comments: L hemiplegia with flaccidity, R pusher (can correct to pull with vc), dysarthria Splint/Cast - Date Prophylactic Dressing Applied (if applicable):  (prafo boot) Restrictions Weight Bearing Restrictions Per Provider Order: No General:   Vital Signs: Therapy Vitals Temp: 98.4 F (36.9 C) Temp Source: Oral Pulse Rate: 68 Resp: 18 BP: 109/81 Patient Position (if appropriate): Lying Oxygen Therapy SpO2: 99 % O2 Device: Room Air Pain: Pain Assessment Pain Scale: 0-10 Pain Score: 0-No pain ADL: ADL Eating: Maximal assistance Where Assessed-Eating: Bed level Grooming: Maximal assistance Where Assessed-Grooming: Bed level Upper Body Bathing: Maximal assistance, Dependent Where Assessed-Upper Body Bathing: Bed level Lower Body Bathing: Dependent Where Assessed-Lower Body Bathing: Bed level Upper Body Dressing: Maximal assistance Where Assessed-Upper Body Dressing: Edge of bed Lower Body Dressing: Dependent Where Assessed-Lower Body Dressing: Edge of bed Toileting: Dependent (simulated d/t no ned for BM or void) Where Assessed-Toileting: Bedside Commode Toilet Transfer: Dependent (STEDY) Toilet Transfer Method: Other (comment) (STEDY) Tub/Shower Transfer: Not assessed Film/video editor: Not assessed Vision   Perception    Praxis  Balance   Exercises:   Other Treatments:     Therapy/Group: Individual Therapy  Geoffery Kiel 12/30/2023, 8:57 AM

## 2023-12-30 NOTE — Plan of Care (Signed)
  Problem: RH BLADDER ELIMINATION Goal: RH STG MANAGE BLADDER WITH ASSISTANCE Description: STG Manage Bladder With toileting Assistance Outcome: Progressing   Problem: RH SAFETY Goal: RH STG ADHERE TO SAFETY PRECAUTIONS W/ASSISTANCE/DEVICE Description: STG Adhere to Safety Precautions With cues Assistance/Device. Outcome: Progressing   Problem: RH PAIN MANAGEMENT Goal: RH STG PAIN MANAGED AT OR BELOW PT'S PAIN GOAL Description: < 4 with prns Outcome: Progressing   Problem: RH KNOWLEDGE DEFICIT Goal: RH STG INCREASE KNOWLEDGE OF HYPERTENSION Description: Patient and spouse will be able to manage HTN using educational resources for medications and dietary modification independently Outcome: Progressing

## 2023-12-30 NOTE — Progress Notes (Signed)
 PROGRESS NOTE   Subjective/Complaints: No new complaints or concerns this Am. Pain is controlled. LBM yesterday.   ROS: Patient denies fever, rash, sore throat, blurred vision, dizziness, nausea, vomiting, diarrhea, cough, shortness of breath or chest pain,   headache, or mood change.  + constipation - improved  + lower back pain- reports has this intermittently before admission, controlled   Objective:   No results found.   No results for input(s): "WBC", "HGB", "HCT", "PLT" in the last 72 hours.    Recent Labs    12/29/23 0545  NA 140  K 3.9  CL 110  CO2 19*  GLUCOSE 78  BUN 19  CREATININE 1.61*  CALCIUM  9.2       Intake/Output Summary (Last 24 hours) at 12/30/2023 1253 Last data filed at 12/30/2023 1252 Gross per 24 hour  Intake 832 ml  Output 100 ml  Net 732 ml        Physical Exam: Vital Signs Blood pressure 109/81, pulse 90, temperature 98.1 F (36.7 C), temperature source Oral, resp. rate 17, height 5\' 10"  (1.778 m), weight 73.6 kg, SpO2 100%.   Constitutional: No distress . Vital signs reviewed.  HEENT: NCAT, EOMI, oral membranes moist Neck: supple Cardiovascular: RRR without murmur. No JVD    Respiratory/Chest: CTA Bilaterally without wheezes or rales. Normal effort  On RA GI/Abdomen: BS + normoactive, non-tender, non-distended, soft Ext: no clubbing, cyanosis, or edema Psych: pleasant and cooperative  Skin: No evidence of breakdown, no evidence of rash Neurologic: awake and alert . Answers questions. Neurological: Ox3- delayed responses, but appropriate  DTR's 3+ on left. Flexor tone 1+/4 LUE and 1/4 LLE.  Sensory exam normal sensation to light touch right side and LLE absent LT LUE but feels pinch  Musculoskeletal: Full range of motion in all 4 extremities. No joint swelling No significant L spine TTP  Wearing L AFP  Prior exam Motor strength is 5/5 in right and 0/5 left  deltoid,  bicep, tricep, trace grip, hip flexor, knee extensors, ankle dorsiflexor and plantar flexor.   Assessment/Plan: 1. Functional deficits which require 3+ hours per day of interdisciplinary therapy in a comprehensive inpatient rehab setting. Physiatrist is providing close team supervision and 24 hour management of active medical problems listed below. Physiatrist and rehab team continue to assess barriers to discharge/monitor patient progress toward functional and medical goals  Care Tool:  Bathing    Body parts bathed by patient: Left arm, Chest, Abdomen, Front perineal area, Buttocks, Right upper leg, Left upper leg, Face, Right lower leg, Left lower leg   Body parts bathed by helper: Right arm, Right lower leg, Left lower leg     Bathing assist Assist Level: Moderate Assistance - Patient 50 - 74%     Upper Body Dressing/Undressing Upper body dressing   What is the patient wearing?: Pull over shirt    Upper body assist Assist Level: Minimal Assistance - Patient > 75%    Lower Body Dressing/Undressing Lower body dressing      What is the patient wearing?: Pants, Underwear/pull up     Lower body assist Assist for lower body dressing: Maximal Assistance - Patient 25 - 49%  Toileting Toileting    Toileting assist Assist for toileting: Total Assistance - Patient < 25%     Transfers Chair/bed transfer  Transfers assist  Chair/bed transfer activity did not occur: Safety/medical concerns  Chair/bed transfer assist level: Minimal Assistance - Patient > 75% (squat pivot to R with CGA; to L with MinA.)     Locomotion Ambulation   Ambulation assist   Ambulation activity did not occur: Safety/medical concerns  Assist level: Maximal Assistance - Patient 25 - 49% Assistive device: Hand held assist Max distance: 70 ft   Walk 10 feet activity   Assist  Walk 10 feet activity did not occur: Safety/medical concerns  Assist level: Maximal Assistance - Patient 25 -  49% Assistive device: Other (comment) (underarm support on L and HHA on R)   Walk 50 feet activity   Assist Walk 50 feet with 2 turns activity did not occur: Safety/medical concerns  Assist level: Maximal Assistance - Patient 25 - 49% Assistive device: Other (comment) (underarm support on L and HHA on R)    Walk 150 feet activity   Assist Walk 150 feet activity did not occur: Safety/medical concerns         Walk 10 feet on uneven surface  activity   Assist Walk 10 feet on uneven surfaces activity did not occur: Safety/medical concerns         Wheelchair     Assist Is the patient using a wheelchair?: Yes Type of Wheelchair: Manual    Wheelchair assist level: Dependent - Patient 0%      Wheelchair 50 feet with 2 turns activity    Assist        Assist Level: Dependent - Patient 0%   Wheelchair 150 feet activity     Assist      Assist Level: Dependent - Patient 0%   Blood pressure 109/81, pulse 90, temperature 98.1 F (36.7 C), temperature source Oral, resp. rate 17, height 5\' 10"  (1.778 m), weight 73.6 kg, SpO2 100%.  Medical Problem List and Plan: 1. 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             -patient may  shower             -ELOS/Goals: 5/29 days Min A  -Continue CIR therapies including PT, OT, and SLP    PRAFO and WHO for LLE and LUE  -Expected discharge 5/29     2.  Antithrombotics: -DVT/anticoagulation: Eliquis              -antiplatelet therapy: ASA 81mg  daily 3. Pain Management/spasticity: Tylenol  as needed Post CVA HA resolved   5/11- Will add topamax  50 mg at bedtime 5/23 Topamax  reduced to 25mg  per prior plan Bilateral knee pain , aching in am s- OA noted on Xrays in 2023 start diclofenac  gel   -emerging left sided tone.    -5/4 unable to tolerate even low dose baclofen --> stopped    -outpt botox  candidate - Pain control overall with Tylenol , continue current regimen -5/19 told pt that some of his groin pain might be intermitent spasm of his left hip adductors. Continue with ROM, activity with therapy.  -5/21 Will add PRN robaxin, occasional low back pain -5/23 pain overall controlled. Continue to monitor 4. Mood/Behavior/Sleep: Provide emotional support             -antipsychotic agents: N/A 5. Neuropsych/cognition: This patient is ?capable of making  decisions on his own behalf. 6. Skin/Wound Care: Routine skin checks 7. Fluids/Electrolytes/Nutrition: Routine in and outs with follow-up chemistries Intake of fluids improved to yesterday  8.  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  Followed by heart failure team.  Monitor for any signs of fluid overload.  Toprol  XL 12.5 mg daily.  Monitor for any hypotension  .vital Vitals:   12/30/23 0518 12/30/23 1250  BP: 109/81 109/81  Pulse: 68 90  Resp: 18 17  Temp: 98.4 F (36.9 C) 98.1 F (36.7 C)  SpO2: 99% 100%    5/19  BP remains soft off metoprolol ,HR mild brady to normal   5/23 BP stable, continue to monitor  Filed Weights   12/27/23 0620 12/29/23 0431 12/30/23 0700  Weight: 74.1 kg 73 kg 73.6 kg   Weight coming down   5/16 NO signs of overload, suspect wt today not accurate  5/17-20 weight stable today, no signs overload 9.  CAD/MI/CABG.  Follow-up cardiology services.  Currently maintained on aspirin  and Plavix  10.  VT.  Last episode noted 2023 with successful ATP. 11.  CKD stage IIIA=  Baseline creatinine 1.3-1.5.  Follow-up chemistries,stable  Creat  5/19 Cr up to 1.7 today, sl above baseline--encourage adequate fluids 5/22 creatinine/BUN a little improved to 1.61/19    Latest Ref Rng & Units 12/29/2023    5:45 AM 12/26/2023    5:57 AM 12/19/2023    5:23 AM  BMP  Glucose 70 - 99 mg/dL 78  87  87   BUN 8 - 23 mg/dL 19  22  17    Creatinine 0.61 - 1.24 mg/dL  1.61  0.96  0.45   Sodium 135 - 145 mmol/L 140  140  139   Potassium 3.5 - 5.1 mmol/L 3.9  3.6  4.0   Chloride 98 - 111 mmol/L 110  112  108   CO2 22 - 32 mmol/L 19  20  21    Calcium  8.9 - 10.3 mg/dL 9.2  9.2  8.9     12.  HIV.  Continue Dovato - followed at the [facility] 13.  Hyperlipidemia.  Lipitor/Zetia  14.  GERD.  Pepcid  15.  Constipation.  MiraLAX  daily, Senokot changed to 2 po BID    LBM 5/17-   5/19 I reviewed kub with pt/wife. Mostly just gaseous distention   -he is feeling better today   -maalox gas if needed, increase miralax  to bid   -encouraged on going po intake   -keep bowels moving  5/20 Consider additional med if no BM today  5/21 Low dose sorbitol    5/23 LBM yesterday reported, last documented 5/21- continue MiraLAX  twice daily, Senokot 16.  Dysphagia.  Patient notes coughing on current diet of mechanical soft.  Dysphagia 2 thin liquids and have speech therapy follow-up.   MBS and FEES looked ok   -now on dys 3/thin diet 17. Daily HA's since stroke- tylenol  + topamax  started 5/11, improved- try reducing dose to 25mg  and monitor   -5/23 topamax  dose decreased to 25mg   18. Anemia  -5/19 stable 12.9 19, Elevated LFTs  -adjust tylenol  to q6h prn, recheck thurs  - 5/22 LFTs improving  LOS: 24 days A FACE TO FACE EVALUATION WAS PERFORMED  Lylia Sand 12/30/2023, 12:53 PM

## 2023-12-31 MED ORDER — SENNOSIDES-DOCUSATE SODIUM 8.6-50 MG PO TABS
2.0000 | ORAL_TABLET | Freq: Every day | ORAL | Status: DC
  Administered 2024-01-01 – 2024-01-03 (×3): 2 via ORAL
  Filled 2023-12-31 (×3): qty 2

## 2023-12-31 NOTE — Plan of Care (Signed)
  Problem: Consults Goal: RH STROKE PATIENT EDUCATION Description: See Patient Education module for education specifics  Outcome: Progressing   Problem: RH BOWEL ELIMINATION Goal: RH STG MANAGE BOWEL WITH ASSISTANCE Description: STG Manage Bowel with mod I Assistance. Outcome: Progressing Goal: RH STG MANAGE BOWEL W/MEDICATION W/ASSISTANCE Description: STG Manage Bowel with Medication with mod I Assistance. Outcome: Progressing   Problem: RH BLADDER ELIMINATION Goal: RH STG MANAGE BLADDER WITH ASSISTANCE Description: STG Manage Bladder With toileting Assistance Outcome: Progressing   Problem: RH SAFETY Goal: RH STG ADHERE TO SAFETY PRECAUTIONS W/ASSISTANCE/DEVICE Description: STG Adhere to Safety Precautions With cues Assistance/Device. Outcome: Progressing   Problem: RH PAIN MANAGEMENT Goal: RH STG PAIN MANAGED AT OR BELOW PT'S PAIN GOAL Description: < 4 with prns Outcome: Progressing   Problem: RH KNOWLEDGE DEFICIT Goal: RH STG INCREASE KNOWLEDGE OF HYPERTENSION Description: Patient and spouse will be able to manage HTN using educational resources for medications and dietary modification independently Outcome: Progressing Goal: RH STG INCREASE KNOWLEDGE OF DYSPHAGIA/FLUID INTAKE Description: Patient and spouse will be able to manage Dysphagia using educational resources for medications and dietary modification independently Outcome: Progressing Goal: RH STG INCREASE KNOWLEGDE OF HYPERLIPIDEMIA Description: Patient and spouse will be able to manage HLD using educational resources for medications and dietary modification independently Outcome: Progressing Goal: RH STG INCREASE KNOWLEDGE OF STROKE PROPHYLAXIS Description: Patient and spouse will be able to manage secondary risks using educational resources for medications and dietary modification independently Outcome: Progressing

## 2023-12-31 NOTE — Progress Notes (Signed)
 PROGRESS NOTE   Subjective/Complaints: No new complaints this morning Would like to get back into bed Family is at bedside VSS  ROS: Patient denies fever, rash, sore throat, blurred vision, dizziness, nausea, vomiting, diarrhea, cough, shortness of breath or chest pain,   headache, or mood change.  + constipation - improved  + lower back pain- reports has this intermittently before admission, controlled   Objective:   No results found.   No results for input(s): "WBC", "HGB", "HCT", "PLT" in the last 72 hours.    Recent Labs    12/29/23 0545  NA 140  K 3.9  CL 110  CO2 19*  GLUCOSE 78  BUN 19  CREATININE 1.61*  CALCIUM  9.2       Intake/Output Summary (Last 24 hours) at 12/31/2023 1513 Last data filed at 12/31/2023 1243 Gross per 24 hour  Intake 820 ml  Output 950 ml  Net -130 ml        Physical Exam: Vital Signs Blood pressure 111/83, pulse 72, temperature 98.1 F (36.7 C), temperature source Oral, resp. rate 18, height 5\' 10"  (1.778 m), weight 71.3 kg, SpO2 97%.   Constitutional: No distress . Vital signs reviewed.  HEENT: NCAT, EOMI, oral membranes moist Neck: supple Cardiovascular: RRR without murmur. No JVD    Respiratory/Chest: CTA Bilaterally without wheezes or rales. Normal effort  On RA GI/Abdomen: BS + normoactive, non-tender, non-distended, soft Ext: no clubbing, cyanosis, or edema Psych: pleasant and cooperative  Skin: No evidence of breakdown, no evidence of rash Neurologic: awake and alert . Answers questions. Neurological: Ox3- delayed responses, but appropriate  DTR's 3+ on left. Flexor tone 1+/4 LUE and 1/4 LLE.  Sensory exam normal sensation to light touch right side and LLE absent LT LUE but feels pinch  Musculoskeletal: Full range of motion in all 4 extremities. No joint swelling No significant L spine TTP  Wearing L AFP  Prior exam Motor strength is 5/5 in right and 0/5  left  deltoid, bicep, tricep, trace grip, hip flexor, knee extensors, ankle dorsiflexor and plantar flexor. Stable 5/24  Assessment/Plan: 1. Functional deficits which require 3+ hours per day of interdisciplinary therapy in a comprehensive inpatient rehab setting. Physiatrist is providing close team supervision and 24 hour management of active medical problems listed below. Physiatrist and rehab team continue to assess barriers to discharge/monitor patient progress toward functional and medical goals  Care Tool:  Bathing    Body parts bathed by patient: Left arm, Chest, Abdomen, Front perineal area, Buttocks, Right upper leg, Left upper leg, Face, Right lower leg, Left lower leg   Body parts bathed by helper: Right arm, Right lower leg, Left lower leg     Bathing assist Assist Level: Moderate Assistance - Patient 50 - 74%     Upper Body Dressing/Undressing Upper body dressing   What is the patient wearing?: Pull over shirt    Upper body assist Assist Level: Minimal Assistance - Patient > 75%    Lower Body Dressing/Undressing Lower body dressing      What is the patient wearing?: Pants, Underwear/pull up     Lower body assist Assist for lower body dressing: Maximal Assistance -  Patient 25 - 49%     Toileting Toileting    Toileting assist Assist for toileting: Total Assistance - Patient < 25%     Transfers Chair/bed transfer  Transfers assist  Chair/bed transfer activity did not occur: Safety/medical concerns  Chair/bed transfer assist level: Minimal Assistance - Patient > 75% (squat pivot to R with CGA; to L with MinA.)     Locomotion Ambulation   Ambulation assist   Ambulation activity did not occur: Safety/medical concerns  Assist level: Maximal Assistance - Patient 25 - 49% Assistive device: Hand held assist Max distance: 70 ft   Walk 10 feet activity   Assist  Walk 10 feet activity did not occur: Safety/medical concerns  Assist level: Maximal  Assistance - Patient 25 - 49% Assistive device: Other (comment) (underarm support on L and HHA on R)   Walk 50 feet activity   Assist Walk 50 feet with 2 turns activity did not occur: Safety/medical concerns  Assist level: Maximal Assistance - Patient 25 - 49% Assistive device: Other (comment) (underarm support on L and HHA on R)    Walk 150 feet activity   Assist Walk 150 feet activity did not occur: Safety/medical concerns         Walk 10 feet on uneven surface  activity   Assist Walk 10 feet on uneven surfaces activity did not occur: Safety/medical concerns         Wheelchair     Assist Is the patient using a wheelchair?: Yes Type of Wheelchair: Manual    Wheelchair assist level: Dependent - Patient 0%      Wheelchair 50 feet with 2 turns activity    Assist        Assist Level: Dependent - Patient 0%   Wheelchair 150 feet activity     Assist      Assist Level: Dependent - Patient 0%   Blood pressure 111/83, pulse 72, temperature 98.1 F (36.7 C), temperature source Oral, resp. rate 18, height 5\' 10"  (1.778 m), weight 71.3 kg, SpO2 97%.  Medical Problem List and Plan: 1. 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             -patient may  shower             -ELOS/Goals: 5/29 days Min A  -Continue CIR therapies including PT, OT, and SLP    PRAFO and WHO for LLE and LUE  -Expected discharge 5/29     2.  Antithrombotics: -DVT/anticoagulation: Eliquis              -antiplatelet therapy: ASA 81mg  daily 3. Pain Management/spasticity: Tylenol  as needed Post CVA HA resolved   5/11- Will add topamax  50 mg at bedtime 5/23 Topamax  reduced to 25mg  per prior plan Bilateral knee pain , aching in am s- OA noted on Xrays in 2023 start diclofenac  gel   -emerging left sided tone.    -5/4 unable to tolerate even low dose baclofen -->  stopped    -outpt botox candidate - Pain control overall with Tylenol , continue current regimen -5/19 told pt that some of his groin pain might be intermitent spasm of his left hip adductors. Continue with ROM, activity with therapy.  -5/21 Will add PRN robaxin , occasional low back pain -5/23 pain overall controlled. Continue to monitor 4. Mood/Behavior/Sleep: Provide emotional support             -antipsychotic agents: N/A  5. Neuropsych/cognition: This patient is ?capable of making decisions on his own behalf. 6. Skin/Wound Care: Routine skin checks 7. Fluids/Electrolytes/Nutrition: Routine in and outs with follow-up chemistries Intake of fluids improved to yesterday  8.  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  Followed by heart failure team.  Monitor for any signs of fluid overload.  Toprol  XL 12.5 mg daily.  Monitor for any hypotension  .vital Vitals:   12/31/23 0417 12/31/23 1507  BP: 105/72 111/83  Pulse: 65 72  Resp: 18 18  Temp: 97.8 F (36.6 C) 98.1 F (36.7 C)  SpO2: 100% 97%    5/19  BP remains soft off metoprolol ,HR mild brady to normal   5/23 BP stable, continue to monitor  Filed Weights   12/29/23 0431 12/30/23 0700 12/31/23 0500  Weight: 73 kg 73.6 kg 71.3 kg   Weight coming down   5/16 NO signs of overload, suspect wt today not accurate  5/17-20 weight stable today, no signs overload 9.  CAD/MI/CABG.  Follow-up cardiology services.  Currently maintained on aspirin  and Plavix  10.  VT.  Last episode noted 2023 with successful ATP. 11.  CKD stage IIIA=  Baseline creatinine 1.3-1.5.  Follow-up chemistries,stable  Creat  5/19 Cr up to 1.7 today, sl above baseline--encourage adequate fluids 5/22 creatinine/BUN a little improved to 1.61/19    Latest Ref Rng & Units 12/29/2023    5:45 AM 12/26/2023    5:57 AM 12/19/2023    5:23 AM  BMP  Glucose 70 - 99 mg/dL 78  87  87   BUN 8 - 23 mg/dL 19  22  17     Creatinine 0.61 - 1.24 mg/dL 1.61  0.96  0.45   Sodium 135 - 145 mmol/L 140  140  139   Potassium 3.5 - 5.1 mmol/L 3.9  3.6  4.0   Chloride 98 - 111 mmol/L 110  112  108   CO2 22 - 32 mmol/L 19  20  21    Calcium  8.9 - 10.3 mg/dL 9.2  9.2  8.9     12.  HIV.  Continue Dovato - followed at the [facility] 13.  Hyperlipidemia.  Lipitor/Zetia   14.  GERD.  Continue Pepcid   15.  Constipation.  MiraLAX  daily, Senokot changed to 2 po BID    LBM 5/17-   5/19 I reviewed kub with pt/wife. Mostly just gaseous distention   -he is feeling better today   -maalox gas if needed, increase miralax  to bid   -encouraged on going po intake   -keep bowels moving  5/20 Consider additional med if no BM today  5/21 Low dose sorbitol    Continue MiraLAX  twice daily, decrease Senokot to 2 tabs HS  16.  Dysphagia.  Patient notes coughing on current diet of mechanical soft.  Dysphagia 2 thin liquids and have speech therapy follow-up.   MBS and FEES looked ok   -continue dys 3/thin diet  17. Daily HA's since stroke- tylenol  + topamax  started 5/11, improved- try reducing dose to 25mg  and monitor   -d/c topamax   18. Anemia  -5/19 stable 12.9  19, Elevated LFTs  -adjust tylenol  to q6h prn, recheck thurs  - 5/22 LFTs improving  LOS: 25 days A FACE TO FACE EVALUATION WAS PERFORMED  Lynnleigh Soden P Alexah Kivett 12/31/2023, 3:13 PM

## 2023-12-31 NOTE — Progress Notes (Signed)
 Occupational Therapy Session Note  Patient Details  Name: Richard [name] MRN: [id number] Date of Birth: Dec 15, 1955  Today's Date: 12/31/2023 OT Individual Time: 1100-1210 OT Individual Time Calculation (min): 70 min    Short Term Goals: Week 1:  OT Short Term Goal 1 (Week 1): Pt will maintain sitting balance min A during BADLs OT Short Term Goal 1 - Progress (Week 1): Met OT Short Term Goal 2 (Week 1): Pt will complete UB bathing with mod A using hemi techniques PRN OT Short Term Goal 2 - Progress (Week 1): Met OT Short Term Goal 3 (Week 1): Pt will donn shirt with mod A using hemi-techniques PRN OT Short Term Goal 3 - Progress (Week 1): Met OT Short Term Goal 4 (Week 1): Pt will tolerate sitting up in wc between therapy sessions 5/7 days OT Short Term Goal 4 - Progress (Week 1): Met OT Short Term Goal 5 (Week 1): Pt will complete toilet transfers with max A x1 OT Short Term Goal 5 - Progress (Week 1): Progressing toward goal Week 2:  OT Short Term Goal 1 (Week 2): Pt will complete toilet transfers with max A x1 OT Short Term Goal 1 - Progress (Week 2): Met OT Short Term Goal 2 (Week 2): Pt will maintain L UE in safe position when sitting up in wc with min verbal cues OT Short Term Goal 2 - Progress (Week 2): Met OT Short Term Goal 3 (Week 2): Pt will complete squat pivot transfers with max A x1 OT Short Term Goal 3 - Progress (Week 2): Met OT Short Term Goal 4 (Week 2): Pt will maintain sitting balance in preparation for functional transfers with CGA consistently OT Short Term Goal 4 - Progress (Week 2): Met Week 3:  OT Short Term Goal 1 (Week 3): Pt will maintain sitting balance in preparation for functional transfers with close supervision consistently OT Short Term Goal 1 - Progress (Week 3): Met OT Short Term Goal 2 (Week 3): Pt will complete one step of lower body dressing OT Short Term Goal 2 - Progress (Week 3): Met OT Short Term Goal 3 (Week 3): Pt will complete toilet  transfers with mod A using LRAD OT Short Term Goal 3 - Progress (Week 3): Met Week 4:  OT Short Term Goal 1 (Week 4): STG=LTG d/t ELOS  Skilled Therapeutic Interventions/Progress Updates:     Pt received in bed ready for therapy.  His wife present and provided her education as the session occurred.      ADL Retraining: ADL Eating: Supervision/safety Where Assessed-Eating: Wheelchair Grooming: Minimal assistance Where Assessed-Grooming: Wheelchair Upper Body Bathing: Supervision/safety (using long handled sponge to reach under and over R arm) Where Assessed-Upper Body Bathing: Shower Lower Body Bathing: Minimal assistance (using long handled sponge to reach feet; therapist assisted washing bottom) Where Assessed-Lower Body Bathing: Shower Upper Body Dressing: Minimal assistance using hemi dressing strategies Where Assessed-Upper Body Dressing: Wheelchair Lower Body Dressing: Maximal assistance Where Assessed-Lower Body Dressing: Secondary school teacher: Minimal assistance Film/video editor Method: Administrator: Emergency planning/management officer, Grab bars     Transfers: Min A to roll and sit to EOB and then complete squat pivot to w/c.  Pt used standard Wc to transfer in and out of shower with light min A/CGA as he was able to use grab bar to help him perform a squat pivot. Pt tends to move too fast and "throw" himself to the side so worked on controlled  movement bed to [facility] and pt able to carry this over to enable him to have a safer tub bench transfer  Balance: -sitting on EOB pt held balance well with no LOB to his L, he was able to maintain balance seated on tub bench well, stood in shower with bar and next to bed with bed rail needing min A to support static sit.   -from w/c pt able to reach down to floor level to adjust socks with CLOSE supervision  Neuromuscular Re-Education:  Prior to getting out of bed, had pt focus on LUE movements. Demonstrated to  wife exercises that can be done in supine to ensure shoulder alignment while challenging distal movements.  -supine in bed worked on [employer] with a focus on shoulder and tricep control.  A/arom initially for pt to push hand to ceiling progressing to pt being able to fulling extend elbow and lift hand up to ceiling and holding that position for well over 10 seconds at a time. -a/arom with PNF d1/2 patterns demonstrating improving shoulder flex/ext Pt has increased tone in biceps, recommended he work on elbow ext AROM to ensure his elbow flexors do not get too tight.   -bridges with LLE supported  Needed to transfer pt from standard wc to TIS based on his primary therapist's recommendation but unable to safely align the 2 chairs for a safe squat pivot. Used the stedy lift to transfer pt.   In stedy lift, had pt work on static stands focusing on LLE extension and holding for close to 2 min.  Pt placed in TIS but no leg rests in the room.  Used a wooden bench to support his feet when in w/c.    Pt resting in TIS wc with all needs met. Alarm set and call light in reach.   Wife in room with pt.    Therapy Documentation Precautions:  Precautions Precautions: Fall, Other (comment) Precaution/Restrictions Comments: L hemiplegia with flaccidity, R pusher (can correct to pull with vc), dysarthria Splint/Cast - Date Prophylactic Dressing Applied (if applicable):  (prafo boot) Restrictions Weight Bearing Restrictions Per Provider Order: No    Vital Signs: Therapy Vitals Temp: 98.1 F (36.7 C) Temp Source: Oral Pulse Rate: 72 Resp: 18 BP: 111/83 Patient Position (if appropriate): Lying Oxygen Therapy SpO2: 97 % O2 Device: Room Air Pain:  No c/o pain        Therapy/Group: Individual Therapy  Zophia Marrone 12/31/2023, 4:14 PM

## 2024-01-01 MED ORDER — METHOCARBAMOL 500 MG PO TABS
250.0000 mg | ORAL_TABLET | Freq: Four times a day (QID) | ORAL | Status: DC | PRN
  Administered 2024-01-03: 250 mg via ORAL
  Filled 2024-01-01: qty 1

## 2024-01-01 NOTE — Progress Notes (Signed)
 Speech Language Pathology Daily Session Note  Patient Details  Name: Richard [name] MRN: [id number] Date of Birth: September 22, 1955  Today's Date: 01/01/2024 SLP Individual Time: 0800-0858 SLP Individual Time Calculation (min): 58 min  Short Term Goals: Week 4: SLP Short Term Goal 1 (Week 4): STG = LTG due to ELOS  Skilled Therapeutic Interventions:  Pt in bed upon SLP arrival, denied pain initially but then later stated 5/10 pain (headache). SLP alerted nursing, who provided medications. SLP facilitated skilled ST targeting pt's dysphagia and cognition goals. Pt completed CTAR x20 reporting a difficulty of 6/10 and Effortful Swallows x20 reporting 5-6/10 difficulty. He attempted the [facility] but continues to require max verbal and visual cueing; was unable to complete correctly. When asked to identify medication errors in pillboxes, he had 100% acc when provided min A for an AM/PM pillbox and mod A when using an AM/noon/PM pillbox. At end of session, pt's pain reported to be 2/10 following medication provided by nursing earlier. Pt left in bed with call bell in reach and bed alarm activated. Continue ST POC.  Pain Pain Assessment Pain Scale: 0-10 Pain Score: 5  Pain Type: Acute pain Pain Location: Head Pain Descriptors / Indicators: Headache  Therapy/Group: Individual Therapy  Caretha Chapel 01/01/2024, 4:17 PM

## 2024-01-01 NOTE — Progress Notes (Signed)
 PROGRESS NOTE   Subjective/Complaints: No new complaints this morning Asks for tylenol  for headache- notified nursing MEWS 1 for soft BP, meds reviewed and not on anti-hypertensive  ROS: Patient denies fever, rash, sore throat, blurred vision, dizziness, nausea, vomiting, diarrhea, cough, shortness of breath or chest pain,   headache, or mood change.  + constipation - improved  + lower back pain- reports has this intermittently before admission, controlled   Objective:   No results found.   No results for input(s): "WBC", "HGB", "HCT", "PLT" in the last 72 hours.    No results for input(s): "NA", "K", "CL", "CO2", "GLUCOSE", "BUN", "CREATININE", "CALCIUM " in the last 72 hours.      Intake/Output Summary (Last 24 hours) at 01/01/2024 1247 Last data filed at 01/01/2024 0840 Gross per 24 hour  Intake 800 ml  Output 500 ml  Net 300 ml        Physical Exam: Vital Signs Blood pressure 96/64, pulse 68, temperature 98.1 F (36.7 C), temperature source Oral, resp. rate 16, height 5\' 10"  (1.778 m), weight 71.4 kg, SpO2 99%.   Constitutional: No distress . Vital signs reviewed.  HEENT: NCAT, EOMI, oral membranes moist Neck: supple Cardiovascular: RRR without murmur. No JVD    Respiratory/Chest: CTA Bilaterally without wheezes or rales. Normal effort  On RA GI/Abdomen: BS + normoactive, non-tender, non-distended, soft Ext: no clubbing, cyanosis, or edema Psych: pleasant and cooperative  Skin: No evidence of breakdown, no evidence of rash Neurologic: awake and alert . Answers questions. Neurological: Ox3- delayed responses, but appropriate  DTR's 3+ on left. Flexor tone 1+/4 LUE and 1/4 LLE.  Sensory exam normal sensation to light touch right side and LLE absent LT LUE but feels pinch  Musculoskeletal: Full range of motion in all 4 extremities. No joint swelling No significant L spine TTP  Wearing L AFP Motor  strength is 5/5 in right and 0/5 left  deltoid, bicep, tricep, trace grip, hip flexor, knee extensors, ankle dorsiflexor and plantar flexor. Stable 5/25  Assessment/Plan: 1. Functional deficits which require 3+ hours per day of interdisciplinary therapy in a comprehensive inpatient rehab setting. Physiatrist is providing close team supervision and 24 hour management of active medical problems listed below. Physiatrist and rehab team continue to assess barriers to discharge/monitor patient progress toward functional and medical goals  Care Tool:  Bathing    Body parts bathed by patient: Left arm, Chest, Abdomen, Front perineal area, Buttocks, Right upper leg, Left upper leg, Face, Right lower leg, Left lower leg   Body parts bathed by helper: Right arm, Right lower leg, Left lower leg     Bathing assist Assist Level: Moderate Assistance - Patient 50 - 74%     Upper Body Dressing/Undressing Upper body dressing   What is the patient wearing?: Pull over shirt    Upper body assist Assist Level: Minimal Assistance - Patient > 75%    Lower Body Dressing/Undressing Lower body dressing      What is the patient wearing?: Pants, Underwear/pull up     Lower body assist Assist for lower body dressing: Maximal Assistance - Patient 25 - 49%     Toileting Toileting  Toileting assist Assist for toileting: Total Assistance - Patient < 25%     Transfers Chair/bed transfer  Transfers assist  Chair/bed transfer activity did not occur: Safety/medical concerns  Chair/bed transfer assist level: Minimal Assistance - Patient > 75%     Locomotion Ambulation   Ambulation assist   Ambulation activity did not occur: Safety/medical concerns  Assist level: Maximal Assistance - Patient 25 - 49% Assistive device: Hand held assist Max distance: 70 ft   Walk 10 feet activity   Assist  Walk 10 feet activity did not occur: Safety/medical concerns  Assist level: Maximal Assistance -  Patient 25 - 49% Assistive device: Other (comment) (underarm support on L and HHA on R)   Walk 50 feet activity   Assist Walk 50 feet with 2 turns activity did not occur: Safety/medical concerns  Assist level: Maximal Assistance - Patient 25 - 49% Assistive device: Other (comment) (underarm support on L and HHA on R)    Walk 150 feet activity   Assist Walk 150 feet activity did not occur: Safety/medical concerns         Walk 10 feet on uneven surface  activity   Assist Walk 10 feet on uneven surfaces activity did not occur: Safety/medical concerns         Wheelchair     Assist Is the patient using a wheelchair?: Yes Type of Wheelchair: Manual    Wheelchair assist level: Dependent - Patient 0%      Wheelchair 50 feet with 2 turns activity    Assist        Assist Level: Dependent - Patient 0%   Wheelchair 150 feet activity     Assist      Assist Level: Dependent - Patient 0%   Blood pressure 96/64, pulse 68, temperature 98.1 F (36.7 C), temperature source Oral, resp. rate 16, height 5\' 10"  (1.778 m), weight 71.4 kg, SpO2 99%.  Medical Problem List and Plan: 1. 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             -patient may  shower             -ELOS/Goals: 5/29 days Min A  -Continue CIR therapies including PT, OT, and SLP    PRAFO and WHO for LLE and LUE  -Expected discharge 5/29     2.  Antithrombotics: -DVT/anticoagulation: Eliquis              -antiplatelet therapy: ASA 81mg  daily 3. Pain Management/spasticity: Tylenol  as needed Post CVA HA resolved   5/11- Will add topamax  50 mg at bedtime 5/23 Topamax  reduced to 25mg  per prior plan Bilateral knee pain , aching in am s- OA noted on Xrays in 2023 start diclofenac  gel   -emerging left sided tone.    -5/4 unable to tolerate even low dose baclofen --> stopped    -outpt  botox candidate - Pain control overall with Tylenol , continue current regimen -5/19 told pt that some of his groin pain might be intermitent spasm of his left hip adductors. Continue with ROM, activity with therapy.  -5/21 Will add PRN robaxin, occasional low back pain -5/23 pain overall controlled. Continue to monitor 4. Mood/Behavior/Sleep: Provide emotional support             -antipsychotic agents: N/A 5. Neuropsych/cognition: This patient is ?capable of making decisions on his own behalf. 6. Skin/Wound Care: Routine skin checks 7. Fluids/Electrolytes/Nutrition: Routine in  and outs with follow-up chemistries Intake of fluids improved to yesterday  8.  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  Followed by heart failure team.  Monitor for any signs of fluid overload.  Toprol  XL 12.5 mg daily.  Monitor for any hypotension  .vital Vitals:   12/31/23 1935 01/01/24 0455  BP: 102/78 96/64  Pulse: 72 68  Resp: 16 16  Temp: 98.9 F (37.2 C) 98.1 F (36.7 C)  SpO2: 100% 99%    5/19  BP remains soft off metoprolol ,HR mild brady to normal   5/23 BP stable, continue to monitor  Filed Weights   12/30/23 0700 12/31/23 0500 01/01/24 0600  Weight: 73.6 kg 71.3 kg 71.4 kg   Weight coming down   5/16 NO signs of overload, suspect wt today not accurate  5/17-20 weight stable today, no signs overload 9.  CAD/MI/CABG.  Follow-up cardiology services.  Currently maintained on aspirin  and Plavix  10.  VT.  Last episode noted 2023 with successful ATP. 11.  CKD stage IIIA=  Baseline creatinine 1.3-1.5.  Follow-up chemistries,stable  Creat  5/19 Cr up to 1.7 today, sl above baseline--encourage adequate fluids 5/22 creatinine/BUN a little improved to 1.61/19    Latest Ref Rng & Units 12/29/2023    5:45 AM 12/26/2023    5:57 AM 12/19/2023    5:23 AM  BMP  Glucose 70 - 99 mg/dL 78  87  87   BUN 8 - 23 mg/dL 19  22  17    Creatinine 0.61 - 1.24  mg/dL 5.95  6.38  7.56   Sodium 135 - 145 mmol/L 140  140  139   Potassium 3.5 - 5.1 mmol/L 3.9  3.6  4.0   Chloride 98 - 111 mmol/L 110  112  108   CO2 22 - 32 mmol/L 19  20  21    Calcium  8.9 - 10.3 mg/dL 9.2  9.2  8.9     12.  HIV.  Continue Dovato - followed at the [facility] 13.  Hyperlipidemia.  Lipitor/Zetia   14.  GERD.  Continue Pepcid   15.  Constipation.  MiraLAX  daily, Senokot changed to 2 po BID    LBM 5/17-   5/19 I reviewed kub with pt/wife. Mostly just gaseous distention   -he is feeling better today   -maalox gas if needed, increase miralax  to bid   -encouraged on going po intake   -keep bowels moving  5/20 Consider additional med if no BM today  5/21 Low dose sorbitol    Continue MiraLAX  twice daily, decrease Senokot to 2 tabs HS  16.  Dysphagia.  Patient notes coughing on current diet of mechanical soft.  Dysphagia 2 thin liquids and have speech therapy follow-up.   MBS and FEES looked ok   -continue dys 3/thin diet  17. Daily HA's since stroke- tylenol  + topamax  started 5/11, improved- try reducing dose to 25mg  and monitor   -d/c topamax , continue tylenol   18. Anemia  -5/19 stable 12.9  19, Elevated LFTs  -adjust tylenol  to q6h prn, recheck thurs  - 5/22 LFTs improving  20. Hypotension: decrease robaxin to 250mg   LOS: 26 days A FACE TO FACE EVALUATION WAS PERFORMED  Keven Pel Maxyne Derocher 01/01/2024, 12:47 PM

## 2024-01-01 NOTE — Plan of Care (Signed)
  Problem: RH BOWEL ELIMINATION Goal: RH STG MANAGE BOWEL WITH ASSISTANCE Description: STG Manage Bowel with mod I Assistance. Flowsheets (Taken 01/01/2024 0419) STG: Pt will manage bowels with assistance: 3-Moderate assistance   Problem: RH BLADDER ELIMINATION Goal: RH STG MANAGE BLADDER WITH ASSISTANCE Description: STG Manage Bladder With toileting Assistance Flowsheets (Taken 01/01/2024 0419) STG: Pt will manage bladder with assistance: 3-Moderate assistance   Problem: RH SAFETY Goal: RH STG ADHERE TO SAFETY PRECAUTIONS W/ASSISTANCE/DEVICE Description: STG Adhere to Safety Precautions With cues Assistance/Device. Flowsheets (Taken 01/01/2024 0419) STG:Pt will adhere to safety precautions with assistance/device: 5-Supervision/cueing   Problem: RH PAIN MANAGEMENT Goal: RH STG PAIN MANAGED AT OR BELOW PT'S PAIN GOAL Description: < 4 with prns Note: Pain < 2 on pain scale

## 2024-01-01 NOTE — Progress Notes (Signed)
 Physical Therapy Session Note  Patient Details  Name: Richard [name] MRN: [id number] Date of Birth: 01-19-1956  Today's Date: 01/01/2024 PT Individual Time: 4098-1191 PT Individual Time Calculation (min): 59 min   Short Term Goals: Week 3:  PT Short Term Goal 1 (Week 3): Pt will perform bed mobility with CGA/ MinA. PT Short Term Goal 2 (Week 3): Pt will perform squat pivot transfers with CGA. PT Short Term Goal 3 (Week 3): Pt will perform sit<>stand transfers with MinA. PT Short Term Goal 4 (Week 3): Pt will ambulate at least 30 ft using LRAD with MinA +2. PT Short Term Goal 5 (Week 3): Pt will initiate stair training.  Skilled Therapeutic Interventions/Progress Updates: Pt presents semi-reclined in bed and agreeable to therapy but stating HA and lightheadedness.  BP measured at 88/68, HR 70 in supine.  Discussed w/ nursing and retrieved thigh high TEDS.  Donned w/ total A.  Pants threaded over feet w/ cueing for sequencing.  Pt then able to pull up past knees.  Pt placed R knee in hooklying and PT assisted w/ L and pt able to bridge to pull up pants over hip on R and min/mod A for L side but does clear bottom from bed.  Pt transferred sidelying to sit w/ mod A.  Pt w/ ability to maintain seated EOB w/ CGA w/ 1 LOB posterior.  PT donned shoes and L AFO w/ total A.  Pt performed squat pivot from slightly elevated bed and min A.  Pt wheeled to main gym for time and energy conservation.  Pt wheeled to side of // bars.  Pt performs sit to stand w/ mod A and cues for R hand placement on w/c arm rest.  Pt performed toe taps w/ RLE and blocking kne front and behind to discourage knee hyperextension, manual cues at L gluts and facilitation at R elbow and hand over hand to maintain L hand on bar.  Pt performed squat pivot w/ min A to mat table and practicing scooting side to side w/ ed on forward lean to clear bottom.  Pt returned to room and remained in TIS, reclined back and 1/2 tray on w/ LUE on  pillow.  Chair alarm on and all needs in reach, spouse present.     Therapy Documentation Precautions:  Precautions Precautions: Fall, Other (comment) Precaution/Restrictions Comments: L hemiplegia with flaccidity, R pusher (can correct to pull with vc), dysarthria Splint/Cast - Date Prophylactic Dressing Applied (if applicable):  (prafo boot) Restrictions Weight Bearing Restrictions Per Provider Order: No General:   Vital Signs:   Pain:3/10 HA, just received pain meds  Pain Assessment Pain Scale: 0-10 Pain Score: 0-No pain Pain Type: Acute pain Pain Location: Head Pain Orientation: Anterior Pain Descriptors / Indicators: Headache Pain Frequency: Intermittent Pain Onset: Gradual Patients Stated Pain Goal: 0 Pain Intervention(s): Medication (See eMAR)   Therapy/Group: Individual Therapy  Vibhav Waddill P Deasia Chiu 01/01/2024, 11:21 AM

## 2024-01-01 NOTE — Progress Notes (Signed)
 Uneventful night. Left PRAFO & Left WHO placed at HS. PRN tylenol  given at 2051 for complaint of abdominal pain. Described as feeling like indigestion, offered maalox, wanted to try tylenol  first. Assisted with turning during the night. Taking meds whole with applesauce. Continent thus far using urinal. LBM 05/24. Lianni Kanaan A

## 2024-01-02 DIAGNOSIS — N1831 Chronic kidney disease, stage 3a: Secondary | ICD-10-CM

## 2024-01-02 DIAGNOSIS — E785 Hyperlipidemia, unspecified: Secondary | ICD-10-CM

## 2024-01-02 LAB — COMPREHENSIVE METABOLIC PANEL WITH GFR
ALT: 63 U/L — ABNORMAL HIGH (ref 0–44)
AST: 29 U/L (ref 15–41)
Albumin: 3.2 g/dL — ABNORMAL LOW (ref 3.5–5.0)
Alkaline Phosphatase: 51 U/L (ref 38–126)
Anion gap: 10 (ref 5–15)
BUN: 18 mg/dL (ref 8–23)
CO2: 20 mmol/L — ABNORMAL LOW (ref 22–32)
Calcium: 9 mg/dL (ref 8.9–10.3)
Chloride: 110 mmol/L (ref 98–111)
Creatinine, Ser: 1.46 mg/dL — ABNORMAL HIGH (ref 0.61–1.24)
GFR, Estimated: 53 mL/min — ABNORMAL LOW (ref >60)
Glucose, Bld: 93 mg/dL (ref 70–99)
Potassium: 4 mmol/L (ref 3.5–5.1)
Sodium: 140 mmol/L (ref 135–145)
Total Bilirubin: 0.8 mg/dL (ref 0.0–1.2)
Total Protein: 6.6 g/dL (ref 6.5–8.1)

## 2024-01-02 LAB — CBC
HCT: 37.5 % — ABNORMAL LOW (ref 39.0–52.0)
Hemoglobin: 12.5 g/dL — ABNORMAL LOW (ref 13.0–17.0)
MCH: 27.8 pg (ref 26.0–34.0)
MCHC: 33.3 g/dL (ref 30.0–36.0)
MCV: 83.3 fL (ref 80.0–100.0)
Platelets: 153 10*3/uL (ref 150–400)
RBC: 4.5 MIL/uL (ref 4.22–5.81)
RDW: 15.4 % (ref 11.5–15.5)
WBC: 4.6 10*3/uL (ref 4.0–10.5)
nRBC: 0 % (ref 0.0–0.2)

## 2024-01-02 NOTE — Progress Notes (Signed)
 Uneventful night. Turned every 3 hours. Left prafo boot and left who applied at HS. Demonstrated to wife how to apply splints and to monitor for redness to patient's heels and buttock. Complained of indigestion, PRN maalox given at 1951. Incontinent x 1 tonight. Total assist for hygiene and linen change. LBM 05/24. Keyshon Stein A

## 2024-01-02 NOTE — Progress Notes (Addendum)
 PROGRESS NOTE   Subjective/Complaints: No new complaints this AM. LBM 5/24. Denies pain.   ROS: Patient denies fever, rash, sore throat, blurred vision, dizziness, nausea, vomiting, diarrhea, cough, shortness of breath or chest pain,   or mood change.  + constipation - improved  + lower back pain- reports has this intermittently before admission, controlled + headache- intermittent  Objective:   No results found.   Recent Labs    01/01/24 2350  WBC 4.6  HGB 12.5*  HCT 37.5*  PLT 153      Recent Labs    01/02/24 0544  NA 140  K 4.0  CL 110  CO2 20*  GLUCOSE 93  BUN 18  CREATININE 1.46*  CALCIUM  9.0        Intake/Output Summary (Last 24 hours) at 01/02/2024 1130 Last data filed at 01/02/2024 0849 Gross per 24 hour  Intake 820 ml  Output 1350 ml  Net -530 ml        Physical Exam: Vital Signs Blood pressure 112/77, pulse 67, temperature 98.3 F (36.8 C), resp. rate 17, height 5\' 10"  (1.778 m), weight 71.9 kg, SpO2 99%.   Constitutional: No distress . Vital signs reviewed.  Sitting in WC HEENT: NCAT, EOMI, oral membranes moist Neck: supple Cardiovascular: RRR without murmur. No JVD    Respiratory/Chest: CTA Bilaterally without wheezes or rales. Normal effort  On RA GI/Abdomen: BS + normoactive, non-tender, non-distended, soft Ext: no clubbing, cyanosis, or edema Psych: pleasant and cooperative  Skin: No evidence of breakdown, no evidence of rash Neurologic: awake and alert . Answers questions. Neurological: Ox3- delayed responses, but appropriate  DTR's 3+ on left. Flexor tone 2+/4 LUE and 1/4 LLE.  Sensory exam normal sensation to light touch right side and LLE absent LT LUE but feels pinch  Musculoskeletal: Full range of motion in all 4 extremities. No joint swelling No significant L spine TTP  Trace strength grip, 1/5 shoulder abduction on left, 0/5 biceps and triceps Stable 5/26  Prior  exam Motor strength is 5/5 in right and 0/5 left  deltoid, bicep, tricep, trace grip, hip flexor, knee extensors, ankle dorsiflexor and plantar flexor.   Assessment/Plan: 1. Functional deficits which require 3+ hours per day of interdisciplinary therapy in a comprehensive inpatient rehab setting. Physiatrist is providing close team supervision and 24 hour management of active medical problems listed below. Physiatrist and rehab team continue to assess barriers to discharge/monitor patient progress toward functional and medical goals  Care Tool:  Bathing    Body parts bathed by patient: Left arm, Chest, Abdomen, Front perineal area, Buttocks, Right upper leg, Left upper leg, Face, Right lower leg, Left lower leg   Body parts bathed by helper: Right arm, Right lower leg, Left lower leg     Bathing assist Assist Level: Moderate Assistance - Patient 50 - 74%     Upper Body Dressing/Undressing Upper body dressing   What is the patient wearing?: Pull over shirt    Upper body assist Assist Level: Minimal Assistance - Patient > 75%    Lower Body Dressing/Undressing Lower body dressing      What is the patient wearing?: Pants, Underwear/pull up  Lower body assist Assist for lower body dressing: Maximal Assistance - Patient 25 - 49%     Toileting Toileting    Toileting assist Assist for toileting: Total Assistance - Patient < 25%     Transfers Chair/bed transfer  Transfers assist  Chair/bed transfer activity did not occur: Safety/medical concerns  Chair/bed transfer assist level: Minimal Assistance - Patient > 75%     Locomotion Ambulation   Ambulation assist   Ambulation activity did not occur: Safety/medical concerns  Assist level: Maximal Assistance - Patient 25 - 49% Assistive device: Hand held assist Max distance: 70 ft   Walk 10 feet activity   Assist  Walk 10 feet activity did not occur: Safety/medical concerns  Assist level: Maximal Assistance -  Patient 25 - 49% Assistive device: Other (comment) (underarm support on L and HHA on R)   Walk 50 feet activity   Assist Walk 50 feet with 2 turns activity did not occur: Safety/medical concerns  Assist level: Maximal Assistance - Patient 25 - 49% Assistive device: Other (comment) (underarm support on L and HHA on R)    Walk 150 feet activity   Assist Walk 150 feet activity did not occur: Safety/medical concerns         Walk 10 feet on uneven surface  activity   Assist Walk 10 feet on uneven surfaces activity did not occur: Safety/medical concerns         Wheelchair     Assist Is the patient using a wheelchair?: Yes Type of Wheelchair: Manual    Wheelchair assist level: Dependent - Patient 0%      Wheelchair 50 feet with 2 turns activity    Assist        Assist Level: Dependent - Patient 0%   Wheelchair 150 feet activity     Assist      Assist Level: Dependent - Patient 0%   Blood pressure 112/77, pulse 67, temperature 98.3 F (36.8 C), resp. rate 17, height 5\' 10"  (1.778 m), weight 71.9 kg, SpO2 99%.  Medical Problem List and Plan: 1. 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             -patient may  shower             -ELOS/Goals: 5/29 days Min A  -Continue CIR therapies including PT, OT, and SLP    PRAFO and WHO for LLE and LUE  -Expected discharge 5/29     2.  Antithrombotics: -DVT/anticoagulation: Eliquis              -antiplatelet therapy: ASA 81mg  daily 3. Pain Management/spasticity: Tylenol  as needed Post CVA HA resolved   5/11- Will add topamax  50 mg at bedtime 5/23 Topamax  reduced to 25mg  per prior plan Bilateral knee pain , aching in am s- OA noted on Xrays in 2023 start diclofenac  gel   -emerging left sided tone.    -5/4 unable to tolerate even low dose baclofen --> stopped    -outpt botox candidate - Pain  control overall with Tylenol , continue current regimen -5/19 told pt that some of his groin pain might be intermitent spasm of his left hip adductors. Continue with ROM, activity with therapy.  -5/21 Will add PRN robaxin, occasional low back pain -5/26 pain controlled, continue to monitor  4. Mood/Behavior/Sleep: Provide emotional support             -antipsychotic agents: N/A 5. Neuropsych/cognition:  This patient is ?capable of making decisions on his own behalf. 6. Skin/Wound Care: Routine skin checks 7. Fluids/Electrolytes/Nutrition: Routine in and outs with follow-up chemistries Intake of fluids improved to yesterday  8.  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  Followed by heart failure team.  Monitor for any signs of fluid overload.  Toprol  XL 12.5 mg daily.  Monitor for any hypotension  .vital Vitals:   01/01/24 1913 01/02/24 0516  BP: 109/69 112/77  Pulse: 77 67  Resp: 18 17  Temp: 98.5 F (36.9 C) 98.3 F (36.8 C)  SpO2: 100% 99%    5/19  BP remains soft off metoprolol ,HR mild brady to normal   5/26 Continue current, BP stable  Filed Weights   12/31/23 0500 01/01/24 0600 01/02/24 0521  Weight: 71.3 kg 71.4 kg 71.9 kg   Weight coming down   5/16 NO signs of overload, suspect wt today not accurate  5/17-20 weight stable today, no signs overload 9.  CAD/MI/CABG.  Follow-up cardiology services.  Currently maintained on aspirin  and Plavix  10.  VT.  Last episode noted 2023 with successful ATP. 11.  CKD stage IIIA=  Baseline creatinine 1.3-1.5.  Follow-up chemistries,stable  Creat  5/19 Cr up to 1.7 today, sl above baseline--encourage adequate fluids 5/22 creatinine/BUN a little improved to 1.61/19 5/26 Cr dong to 1.46    Latest Ref Rng & Units 01/02/2024    5:44 AM 12/29/2023    5:45 AM 12/26/2023    5:57 AM  BMP  Glucose 70 - 99 mg/dL 93  78  87   BUN 8 - 23 mg/dL 18  19  22    Creatinine 0.61 - 1.24 mg/dL 0.98   1.19  1.47   Sodium 135 - 145 mmol/L 140  140  140   Potassium 3.5 - 5.1 mmol/L 4.0  3.9  3.6   Chloride 98 - 111 mmol/L 110  110  112   CO2 22 - 32 mmol/L 20  19  20    Calcium  8.9 - 10.3 mg/dL 9.0  9.2  9.2     12.  HIV.  Continue Dovato - followed at the [facility] 13.  Hyperlipidemia.  Lipitor/Zetia   14.  GERD.  Continue Pepcid   15.  Constipation.  MiraLAX  daily, Senokot changed to 2 po BID    LBM 5/17-   5/19 I reviewed kub with pt/wife. Mostly just gaseous distention   -he is feeling better today   -maalox gas if needed, increase miralax  to bid   -encouraged on going po intake   -keep bowels moving  5/20 Consider additional med if no BM today  5/21 Low dose sorbitol    Continue MiraLAX  twice daily, decrease Senokot to 2 tabs HS  5/26 Continue to monitor bowel function   16.  Dysphagia.  Patient notes coughing on current diet of mechanical soft.  Dysphagia 2 thin liquids and have speech therapy follow-up.   MBS and FEES looked ok   -continue dys 3/thin diet  17. Daily HA's since stroke- tylenol  + topamax  started 5/11, improved- try reducing dose to 25mg  and monitor   -d/c topamax , continue tylenol   18. Anemia  -5/19 stable 12.9  19, Elevated LFTs  -adjust tylenol  to q6h prn, recheck thurs  - 5/26 improving   20. Hypotension: decrease robaxin to 250mg   21. Spasticity,  -continue to monitor tone for now  LOS: 27 days A FACE TO FACE EVALUATION WAS PERFORMED  Lylia Sand 01/02/2024, 11:30  AM

## 2024-01-02 NOTE — Progress Notes (Signed)
 Occupational Therapy Session Note  Patient Details  Name: CADEN FUKUSHIMA MRN: [id number] Date of Birth: 06/24/1956  Today's Date: 01/02/2024 OT Individual Time: 5366-4403 OT Individual Time Calculation (min): 74 min    Short Term Goals: Week 4:  OT Short Term Goal 1 (Week 4): STG=LTG d/t ELOS  Skilled Therapeutic Interventions/Progress Updates:  Pt greeted supine in bed, pt agreeable to OT intervention.      Transfers/bed mobility/functional mobility:  Pt completed supine>sit to L side of bed with CGA and + time, heavy use of bed rails. Pt completed squat pivot transfer to manual w/c to R side with MINA.     ADLs:  Grooming: pt completed denture cleaning with MIN A with pt needing therapist to hold dentures while pt brushed them with RUE.  UB dressing:pt donned OH shirt with MIN A needing assist to thread LUE but completing remainder of task. LB dressing: pt donned pants with MAX A with pt needing assist to thread LLE but then able to stand at bed rail on R side to assist with pulling pants to waist line.  Footwear: donned shoes/AFO and teds dependently.   Bathing: pt completed bathing seated on TTB with MODA with pt using LH sponge as needed for LB. Pt able to stand with grab bars while therapist washed back side.  Transfers: pt completed squat pivots to TTB with MIN A with use of grab bars, MIN cues for set- up and technique.    NMR: pt completed various NMRE tasks to facilitate improved proprioception and motor planning to L hemibody:  -pt completed anterior lean onto bench positioned in front of mat table with pt instructed to complete modified push ups from EOM with an emphasis on providing weightbearing to LUE. Pt needed MIN support at L elbow for optimal joint stability.  -pt able to stand from EOM with MIN A and weightbearing into hi lo table with BUEs while pt completed dynamic reaching with RUE to L side to collect cones from various heights with a focus on weight  shifting to L side. Pt completed task with overall MODA for balance when leaning to L side.  -secured pts LUE into hand scooter with pt instructed to push scooter forward/backwards while seated EOM to facilitate improved scapular protraction/retraction. Pt with active retraction but needed MAX A for scapular protraction.   Ended session with pt seated in TIS with all needs within reach and safety belt alarm activated.                     Therapy Documentation Precautions:  Precautions Precautions: Fall, Other (comment) Precaution/Restrictions Comments: L hemiplegia, RUE flaccid, HIV(+), pacemaker, dysarthria Splint/Cast - Date Prophylactic Dressing Applied (if applicable):  (prafo boot) Restrictions Weight Bearing Restrictions Per Provider Order: No  Pain:no pain     Therapy/Group: Individual Therapy  Mollie Anger [facility] 01/02/2024, 12:05 PM

## 2024-01-02 NOTE — Plan of Care (Signed)
 Incontinent of bowel; managed with assistance

## 2024-01-02 NOTE — Plan of Care (Signed)
  Problem: Consults Goal: RH STROKE PATIENT EDUCATION Description: See Patient Education module for education specifics  Outcome: Progressing   Problem: RH BOWEL ELIMINATION Goal: RH STG MANAGE BOWEL WITH ASSISTANCE Description: STG Manage Bowel with mod I Assistance. Outcome: Progressing Goal: RH STG MANAGE BOWEL W/MEDICATION W/ASSISTANCE Description: STG Manage Bowel with Medication with mod I Assistance. Outcome: Progressing   Problem: RH BLADDER ELIMINATION Goal: RH STG MANAGE BLADDER WITH ASSISTANCE Description: STG Manage Bladder With toileting Assistance Outcome: Progressing   Problem: RH SAFETY Goal: RH STG ADHERE TO SAFETY PRECAUTIONS W/ASSISTANCE/DEVICE Description: STG Adhere to Safety Precautions With cues Assistance/Device. Outcome: Progressing   Problem: RH PAIN MANAGEMENT Goal: RH STG PAIN MANAGED AT OR BELOW PT'S PAIN GOAL Description: < 4 with prns Outcome: Progressing   Problem: RH KNOWLEDGE DEFICIT Goal: RH STG INCREASE KNOWLEDGE OF HYPERTENSION Description: Patient and spouse will be able to manage HTN using educational resources for medications and dietary modification independently Outcome: Progressing Goal: RH STG INCREASE KNOWLEDGE OF DYSPHAGIA/FLUID INTAKE Description: Patient and spouse will be able to manage Dysphagia using educational resources for medications and dietary modification independently Outcome: Progressing Goal: RH STG INCREASE KNOWLEGDE OF HYPERLIPIDEMIA Description: Patient and spouse will be able to manage HLD using educational resources for medications and dietary modification independently Outcome: Progressing Goal: RH STG INCREASE KNOWLEDGE OF STROKE PROPHYLAXIS Description: Patient and spouse will be able to manage secondary risks using educational resources for medications and dietary modification independently Outcome: Progressing

## 2024-01-02 NOTE — Progress Notes (Signed)
 Speech Language Pathology Daily Session Note  Patient Details  Name: Richard [name] MRN: [id number] Date of Birth: 01/20/1956  Today's Date: 01/02/2024 SLP Individual Time: 1104-1200 SLP Individual Time Calculation (min): 56 min  Short Term Goals: Week 4: SLP Short Term Goal 1 (Week 4): STG = LTG due to ELOS  Skilled Therapeutic Interventions:  Patient was seen in am to address speech intelligibility and dysphagia management. Pt alert and seated upright in TIS WC. He denied pain and was agreeable for session. When asked, pt identified Provale cup and rationale for use indep. SLP addressed dysphagia through guiding pt in completion of exercises. Pt completed 1 set of 15 effortful swallows, 1 set of 3 isometric CTARs holding for 30 seconds. He also completed IOPI for lingual strength with 1 set of 10 anteriorly at 15kPA and 1 set of 10 posteriorly at 15kPA. Intensity decreased posteriorly as pt had difficulty completing post completion of other exercises. Pt recalled 4/4 speech intelligibility strategies with min A. SLP guided pt in a conversational exchange with pt maintaining 90- 100% intelligibility at the conversational level. Though cognition not addressed formally this session, pt demonstrated some impaired awareness of deficits during conversation. Gentle education provided on recommendations post discharge and continued fall risk. Pt receptive to information. At conclusion of session, pt was left upright in TIS [facility] with call button within reach and chair alarm active. SLP to continue POC.   Pain Pain Assessment Pain Scale: 0-10 Pain Score: 0-No pain  Therapy/Group: Individual Therapy  Adela Holter 01/02/2024, 12:54 PM

## 2024-01-03 MED ORDER — SENNOSIDES-DOCUSATE SODIUM 8.6-50 MG PO TABS: 2.0000 | ORAL_TABLET | Freq: Every day | ORAL | Status: DC

## 2024-01-03 MED ORDER — POLYETHYLENE GLYCOL 3350 17 G PO PACK
17.0000 g | PACK | Freq: Every day | ORAL | Status: DC | PRN
Start: 2024-01-03 — End: 2024-03-24

## 2024-01-03 MED ORDER — ACETAMINOPHEN 325 MG PO TABS: 650.0000 mg | ORAL_TABLET | Freq: Four times a day (QID) | ORAL | Status: AC | PRN

## 2024-01-03 NOTE — Progress Notes (Signed)
 PROGRESS NOTE   Subjective/Complaints: No new concerns or complaints this morning.  No acute events noted overnight.  ROS: Patient denies fever, rash, sore throat, blurred vision, dizziness, nausea, vomiting, diarrhea, cough, shortness of breath or chest pain,   or mood change.  + constipation - improved  + lower back pain- reports has this intermittently before admission, controlled + headache- improved  Objective:   No results found.   Recent Labs    01/01/24 2350  WBC 4.6  HGB 12.5*  HCT 37.5*  PLT 153      Recent Labs    01/02/24 0544  NA 140  K 4.0  CL 110  CO2 20*  GLUCOSE 93  BUN 18  CREATININE 1.46*  CALCIUM  9.0        Intake/Output Summary (Last 24 hours) at 01/03/2024 1328 Last data filed at 01/03/2024 1311 Gross per 24 hour  Intake 580 ml  Output 700 ml  Net -120 ml        Physical Exam: Vital Signs Blood pressure 99/66, pulse 67, temperature 98 F (36.7 C), resp. rate 17, height 5\' 10"  (1.778 m), weight 71.9 kg, SpO2 96%.   Constitutional: No distress . Vital signs reviewed.  Sitting in WC HEENT: NCAT, EOMI, oral membranes moist Neck: supple Cardiovascular: RRR without murmur. No JVD    Respiratory/Chest: CTA Bilaterally without wheezes or rales. Normal effort  On RA GI/Abdomen: BS + normoactive, non-tender, non-distended, soft Ext: no clubbing, cyanosis, or edema Psych: pleasant and cooperative  Skin: No evidence of breakdown, no evidence of rash Neurologic: awake and alert . Answers questions. Neurological: Ox3- delayed responses, but appropriate  DTR's 3+ on left. Flexor tone 2+/4 LUE and 1/4 LLE.  Sensory exam normal sensation to light touch right side and LLE absent LT LUE but feels pinch  Musculoskeletal: Full range of motion in all 4 extremities. No joint swelling No significant L spine TTP  Trace strength grip, 1/5 shoulder abduction on left, 0/5 biceps and triceps,  1/5 knee extensor on L, 0/5 ankle PF and DF on left Stable 5/26  Prior exam Motor strength is 5/5 in right and 0/5 left  deltoid, bicep, tricep, trace grip, hip flexor, knee extensors, ankle dorsiflexor and plantar flexor.   Assessment/Plan: 1. Functional deficits which require 3+ hours per day of interdisciplinary therapy in a comprehensive inpatient rehab setting. Physiatrist is providing close team supervision and 24 hour management of active medical problems listed below. Physiatrist and rehab team continue to assess barriers to discharge/monitor patient progress toward functional and medical goals  Care Tool:  Bathing    Body parts bathed by patient: Left arm, Chest, Abdomen, Front perineal area, Buttocks, Right upper leg, Left upper leg, Face, Right lower leg, Left lower leg   Body parts bathed by helper: Right arm, Buttocks     Bathing assist Assist Level: Moderate Assistance - Patient 50 - 74%     Upper Body Dressing/Undressing Upper body dressing   What is the patient wearing?: Pull over shirt    Upper body assist Assist Level: Minimal Assistance - Patient > 75%    Lower Body Dressing/Undressing Lower body dressing  What is the patient wearing?: Pants, Underwear/pull up     Lower body assist Assist for lower body dressing: Maximal Assistance - Patient 25 - 49%     Toileting Toileting    Toileting assist Assist for toileting: Total Assistance - Patient < 25%     Transfers Chair/bed transfer  Transfers assist  Chair/bed transfer activity did not occur: Safety/medical concerns  Chair/bed transfer assist level: Minimal Assistance - Patient > 75% (squat pivot)     Locomotion Ambulation   Ambulation assist   Ambulation activity did not occur: Safety/medical concerns  Assist level: Maximal Assistance - Patient 25 - 49% Assistive device: Hand held assist Max distance: 70 ft   Walk 10 feet activity   Assist  Walk 10 feet activity did not  occur: Safety/medical concerns  Assist level: Maximal Assistance - Patient 25 - 49% Assistive device: Other (comment) (underarm support on L and HHA on R)   Walk 50 feet activity   Assist Walk 50 feet with 2 turns activity did not occur: Safety/medical concerns  Assist level: Maximal Assistance - Patient 25 - 49% Assistive device: Other (comment) (underarm support on L and HHA on R)    Walk 150 feet activity   Assist Walk 150 feet activity did not occur: Safety/medical concerns         Walk 10 feet on uneven surface  activity   Assist Walk 10 feet on uneven surfaces activity did not occur: Safety/medical concerns         Wheelchair     Assist Is the patient using a wheelchair?: Yes Type of Wheelchair: Manual    Wheelchair assist level: Dependent - Patient 0%      Wheelchair 50 feet with 2 turns activity    Assist        Assist Level: Dependent - Patient 0%   Wheelchair 150 feet activity     Assist      Assist Level: Dependent - Patient 0%   Blood pressure 99/66, pulse 67, temperature 98 F (36.7 C), resp. rate 17, height 5\' 10"  (1.778 m), weight 71.9 kg, SpO2 96%.  Medical Problem List and Plan: 1. 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             -patient may  shower             -ELOS/Goals: 5/29 days Min A  -Continue CIR therapies including PT, OT, and SLP    PRAFO and WHO for LLE and LUE  -Expected discharge 5/29  Team conference tomorrow      2.  Antithrombotics: -DVT/anticoagulation: Eliquis              -antiplatelet therapy: ASA 81mg  daily 3. Pain Management/spasticity: Tylenol  as needed Post CVA HA resolved   5/11- Will add topamax  50 mg at bedtime 5/23 Topamax  reduced to 25mg  per prior plan Bilateral knee pain , aching in am s- OA noted on Xrays in 2023 start diclofenac  gel   -emerging left sided tone.     -5/4 unable to tolerate even low dose baclofen --> stopped    -outpt botox candidate - Pain control overall with Tylenol , continue current regimen -5/19 told pt that some of his groin pain might be intermitent spasm of his left hip adductors. Continue with ROM, activity with therapy.  -5/21 Will add PRN robaxin, occasional low back pain -5/27 patient reports pain is under control, continue to monitor  4. Mood/Behavior/Sleep: Provide emotional support             -antipsychotic agents: N/A 5. Neuropsych/cognition: This patient is ?capable of making decisions on his own behalf. 6. Skin/Wound Care: Routine skin checks 7. Fluids/Electrolytes/Nutrition: Routine in and outs with follow-up chemistries Intake of fluids improved to yesterday  8.  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  Followed by heart failure team.  Monitor for any signs of fluid overload.  Toprol  XL 12.5 mg daily.  Monitor for any hypotension  .vital Vitals:   01/02/24 1928 01/03/24 0504  BP: 102/66 99/66  Pulse: 78 67  Resp: 18 17  Temp: 98.2 F (36.8 C) 98 F (36.7 C)  SpO2: 98% 96%    5/19  BP remains soft off metoprolol ,HR mild brady to normal   5/27 BP controlled, continue current regimen Filed Weights   12/31/23 0500 01/01/24 0600 01/02/24 0521  Weight: 71.3 kg 71.4 kg 71.9 kg   Weight coming down   5/16 NO signs of overload, suspect wt today not accurate  5/27 no signs of fluid overload continue to monitor 9.  CAD/MI/CABG.  Follow-up cardiology services.  Currently maintained on aspirin  and Plavix  10.  VT.  Last episode noted 2023 with successful ATP. 11.  CKD stage IIIA=  Baseline creatinine 1.3-1.5.  Follow-up chemistries,stable  Creat  5/19 Cr up to 1.7 today, sl above baseline--encourage adequate fluids 5/22 creatinine/BUN a little improved to 1.61/19 5/26 Cr dong to 1.46    Latest Ref Rng & Units 01/02/2024    5:44 AM 12/29/2023    5:45 AM  12/26/2023    5:57 AM  BMP  Glucose 70 - 99 mg/dL 93  78  87   BUN 8 - 23 mg/dL 18  19  22    Creatinine 0.61 - 1.24 mg/dL 7.82  9.56  2.13   Sodium 135 - 145 mmol/L 140  140  140   Potassium 3.5 - 5.1 mmol/L 4.0  3.9  3.6   Chloride 98 - 111 mmol/L 110  110  112   CO2 22 - 32 mmol/L 20  19  20    Calcium  8.9 - 10.3 mg/dL 9.0  9.2  9.2     12.  HIV.  Continue Dovato - followed at the [facility] 13.  Hyperlipidemia.  Lipitor/Zetia   14.  GERD.  Continue Pepcid   15.  Constipation.  MiraLAX  daily, Senokot changed to 2 po BID    LBM 5/17-   5/19 I reviewed kub with pt/wife. Mostly just gaseous distention   -he is feeling better today   -maalox gas if needed, increase miralax  to bid   -encouraged on going po intake   -keep bowels moving  5/20 Consider additional med if no BM today  5/21 Low dose sorbitol    Continue MiraLAX  twice daily, decrease Senokot to 2 tabs HS  5/27 LBM recorded was 5/24, patient denied feeling constipated consider additional medication tomorrow  16.  Dysphagia.  Patient notes coughing on current diet of mechanical soft.  Dysphagia 2 thin liquids and have speech therapy follow-up.   MBS and FEES looked ok   -continue dys 3/thin diet  -eating 100% of meals  17. Daily HA's since stroke- tylenol  + topamax  started 5/11, improved- try reducing dose to 25mg  and monitor   -d/c topamax , continue tylenol   18. Anemia  -5/19 stable 12.9  19, Elevated LFTs  -adjust tylenol  to q6h prn, recheck thurs  - 5/26  improving   20. Hypotension: decrease robaxin to 250mg   21. Spasticity,  -continue to monitor tone for now  - If worsens Botox may be option for focal spasticity as an outpatient  LOS: 28 days A FACE TO FACE EVALUATION WAS PERFORMED  Lylia Sand 01/03/2024, 1:28 PM

## 2024-01-03 NOTE — Progress Notes (Signed)
 Patient ID: Richard [name], male   DOB: 04/17/1956, 68 y.o.   MRN: [id number] Have sent orders to [facility] for portable ramp for the two steps and grab bars for the bathroom and bed rail. Made team aware due to the lateness he may not have this equipment before going home. Wife here for education today and tomorrow and made aware of this

## 2024-01-03 NOTE — Progress Notes (Signed)
 Physical Therapy Weekly Progress Note  Patient Details  Name: Richard [name] MRN: [id number] Date of Birth: [birth year]  Beginning of progress report period: Dec 25, 2023 End of progress report period: Dec 31, 2023  Today's Date: 12/31/2023   Patient has met 3 of 5 short term goals.  Pt making appropriate progress towards goals and is on track to meet LTG. He  has not missed any therapy as he is motivated to continue to improve and progress. He completes bed mobility with CGA, sit<>stand transfers with CGA but then requiring ModA for standing balance. Stand<>pivot transfers with Min/ ModA for balance using steady hand hold. Squat pivot transfers with CGA. He continues to require steady hand hold to rail or +2's hand and is ambulating 50 to 130 ft with intermittent ModA for LLE advancement and continued modA for upright balance using RUE HHA or hodl to handrail. He has also shown ability to navigate four 6-in steps with MaxA using R handrails.  W/c mobility completed with supervision. Requires assist with longer distances. He continues to be primarily limited by premorbid deconditioning, decreased standing balance, L sided sensory deficits, LUE and LLE weakness, and gait impairments. Caregiver/ family  has participated in some family education to prepare for d/c home.More educated to be provided in upcoming week.    Patient continues to demonstrate the following deficits muscle weakness, decreased cardiorespiratoy endurance, impaired timing and sequencing, unbalanced muscle activation, and decreased coordination, decreased attention to left and decreased motor planning, decreased attention, decreased awareness, and decreased safety awareness, and decreased sitting balance, decreased standing balance, and decreased balance strategies and therefore will continue to benefit from skilled PT intervention to increase functional independence with mobility.  Patient progressing toward long term goals..   Continue plan of care.  PT Short Term Goals Week 3:  PT Short Term Goal 1 (Week 3): Pt will perform bed mobility with CGA/ MinA. PT Short Term Goal 1 - Progress (Week 3): Progressing toward goal PT Short Term Goal 2 (Week 3): Pt will perform squat pivot transfers with CGA. PT Short Term Goal 2 - Progress (Week 3): Met PT Short Term Goal 3 (Week 3): Pt will perform sit<>stand transfers with MinA. PT Short Term Goal 3 - Progress (Week 3): Met PT Short Term Goal 4 (Week 3): Pt will ambulate at least 30 ft using LRAD with MinA +2. PT Short Term Goal 4 - Progress (Week 3): Progressing toward goal PT Short Term Goal 5 (Week 3): Pt will initiate stair training. PT Short Term Goal 5 - Progress (Week 3): Met Week 4:  PT Short Term Goal 1 (Week 4): STG = LTG d/t ELOS  Therapy Documentation Precautions:  Precautions Precautions: Fall, Other (comment) Precaution/Restrictions Comments: L hemiplegia, RUE flaccid, HIV(+), pacemaker, dysarthria Splint/Cast - Date Prophylactic Dressing Applied (if applicable):  (prafo boot) Restrictions Weight Bearing Restrictions Per Provider Order: No  Therapy/Group: Individual Therapy  Donne Gage 12/31/2023, 3:33 PM

## 2024-01-03 NOTE — Progress Notes (Signed)
 Speech Language Pathology Daily Session Note  Patient Details  Name: Richard [name] MRN: [id number] Date of Birth: [birth year]  Today's Date: 01/03/2024 SLP Individual Time: 1031-1100 SLP Individual Time Calculation (min): 29 min  Short Term Goals: Week 4: SLP Short Term Goal 1 (Week 4): STG = LTG due to ELOS  Skilled Therapeutic Interventions:  Patient was seen in am to address family education. Pt's wife present and an active participant in session. Pt was alert and seated upright in WC. SLP reviewed current diet recommendations for Dys 3 solids and thin liquids with use of 5cc Provale cup. SLP also provided education on s/s aspiration and recommended compensatory strategies. Handouts provided to supplement education. SLP also addressed speech intelligibility. Pt recalled 4/4 strategies with min A. SLP expanded upon education through instruction about energy conservation methods and anatomical differences impacting intelligibility. Cognition also addressed with recommendations made for wife to manage higher level executive function task. SLP further educated family on HEP in order to continue challenging cognition. Pt's wife was an active participant throughout session; providing insight as approp and asking thoughtful questions. Pt left upright in WC at conclusion of session with chair alarm acitve and call button within reach. SLP to continue POC.   Pain Pain Assessment Pain Scale: 0-10 Pain Score: 0-No pain  Therapy/Group: Individual Therapy  Adela Holter 01/03/2024, 3:55 PM

## 2024-01-03 NOTE — Progress Notes (Signed)
 Physical Therapy Session Note  Patient Details  Name: Richard [name] MRN: [id number] Date of Birth: 1955-10-12  Today's Date: 01/03/2024 PT Individual Time: 1302-1415 PT Individual Time Calculation (min): 73 min   Short Term Goals: Week 2:  PT Short Term Goal 1 (Week 2): Pt will perform bed mobility with MinA. PT Short Term Goal 1 - Progress (Week 2): Progressing toward goal PT Short Term Goal 2 (Week 2): Pt will perform squat pivot transfers with MinA. PT Short Term Goal 2 - Progress (Week 2): Progressing toward goal PT Short Term Goal 3 (Week 2): Pt will perform sit<>stand transfers with ModA. PT Short Term Goal 3 - Progress (Week 2): Met PT Short Term Goal 4 (Week 2): Pt will ambulate at least 30 ft using LRAD with ModA +2. PT Short Term Goal 4 - Progress (Week 2): Met PT Short Term Goal 5 (Week 2): Pt will initiate w/c mobility training. PT Short Term Goal 5 - Progress (Week 2): Met Week 3:  PT Short Term Goal 1 (Week 3): Pt will perform bed mobility with CGA/ MinA. PT Short Term Goal 1 - Progress (Week 3): Progressing toward goal PT Short Term Goal 2 (Week 3): Pt will perform squat pivot transfers with CGA. PT Short Term Goal 2 - Progress (Week 3): Met PT Short Term Goal 3 (Week 3): Pt will perform sit<>stand transfers with MinA. PT Short Term Goal 3 - Progress (Week 3): Met PT Short Term Goal 4 (Week 3): Pt will ambulate at least 30 ft using LRAD with MinA +2. PT Short Term Goal 4 - Progress (Week 3): Progressing toward goal PT Short Term Goal 5 (Week 3): Pt will initiate stair training. PT Short Term Goal 5 - Progress (Week 3): Met Week 4:  PT Short Term Goal 1 (Week 4): STG = LTG d/t ELOS  Skilled Therapeutic Interventions/Progress Updates:  Patient seated upright in TIS w/c on entrance to room. Patient alert and agreeable to PT session. Relates being in an uncomfortable position and ready to get out of seat for a little bit.    Patient with no pain complaint at start  of session.  Therapeutic Activity: Transfers: Pt performed squat pivot transfer to standard w/c toward L side with unexpected increase in extensor tone in LLE. Pt has been performing squat pivot transfers with CGA/ MinA for positioning only but today required almost MaxA to sit on L side in w/c, then requiring vc and MinA to properly position in w/c. Throughout session, pt performs sit<>stand to [facility] requiring vc for hand placement to push from armrest as pt tending to use HW with RUE. Requires CGA/ MinA for rise to stand and MaxA for balance once standing. Pt CGA for sit<>stand with HHA from +2.  Gait Training/ NMR:  Initiated gait training with use of HW. Pt with difficulty in achieving balance upon stance d/t L sided weakness, L knee melt into flexion, inability to weight shift over RUE on HW. Requires vc and MinA up to MaxA to attain balance and MaxA to maintain d/t inability to maintain weight shift to use RUE support on HW.   Attempted to ambulate x3 using HW with significant lean and LOB to L side. Is unable to follow cues for sequencing and unable to maintain balance. Covers ~3-4 ft x3 with MaxA +2 in attempt to maintain weight bearing to R side and RUE over HW. Provided with verbal instructions along with visual demonstration with pt stating understanding but unable to perform  without MaxA +2 for balance.   NMR performed for improvements in motor control and coordination, balance, sequencing, judgement, and self confidence/ efficacy in performing all aspects of mobility at highest level of independence.   Patient supine in bed at end of session with brakes locked, bed alarm set, and all needs within reach.   Therapy Documentation Precautions:  Precautions Precautions: Fall, Other (comment) Precaution/Restrictions Comments: L hemiplegia, RUE flaccid, HIV(+), pacemaker, dysarthria Splint/Cast - Date Prophylactic Dressing Applied (if applicable):  (prafo boot) Restrictions Weight Bearing  Restrictions Per Provider Order: No General:   Vital Signs:   Pain:   Mobility:   Locomotion :    Trunk/Postural Assessment :    Balance:   Exercises:   Other Treatments:      Therapy/Group: Individual Therapy  Donne Gage PT, DPT, CSRS 01/02/2024, 6:25 PM

## 2024-01-03 NOTE — Progress Notes (Signed)
 Occupational Therapy Session Note  Patient Details  Name: Richard [name] MRN: [id number] Date of Birth: 1956-03-08  Today's Date: 01/03/2024 OT Individual Time: 0900-1000 OT Individual Time Calculation (min): 60 min  OT Individual Time: 1448-1530 OT Individual Time Calculation (min): 42 min   Short Term Goals: Week 4:  OT Short Term Goal 1 (Week 4): STG=LTG d/t ELOS  Skilled Therapeutic Interventions/Progress Updates:     AM Session:  Pt received reclined in bed with wife present in room for family education focused session. Pt presenting to be in good spirits receptive to skilled OT session reporting 0/10 pain- OT offering intermittent rest breaks, repositioning, and therapeutic support to optimize participation in therapy session. MD in/out for morning rounding. Focused education this session on BADL education. Spent time at beginning of session discussing home set-up with wife providing pictures. Provided recommendations for bed room set-up for [facility] positioning, using bed rail for bed mobility, TTB positioning and set-up, and simple modifications to increase accessibility. Pt's wife also expressing plans to install ramp for increased access to home. Provided education on skin breakdown prevention, fall prevention, CVA etiology/recovery process, energy conservation, and tone/spasticity management. Re-educated on PRAFO boot WHO for nigh use with Pt's wife able to demonstrate technique for donn/doffing. Set-up up Pt's room to simulate home environment and flattened Pt's bed with bed rail on L side. Engaged Pt in assisting Pt through ADLs of bed mobility, dressing, and toileting with education provided on log rolling technique, hemi-techniques, and AFO. Pt's wife engaged and participatory throughout session and able to demonstrate teach back as evidence of learning with min-mod verbal cues for implementation of hemi-techniques and body positioning to avoid injury when assisting with ADLs. Pt's  wife able to provide CGA-in A when assisting with squat pivot techniques with min questioning cues required for body positioning. Planning to continue family education during tomorrows session. Discussed HH vs OP therapy with education provided on purpose in each with recommendation of HHOT services to beginning with and transitioning to OPOT when appropriate with Pt and Pt's wife in agreement with plan. Recommending 24/7 supervision to ensure Pt safety. Pt was left resting in TISWC with call bell in reach, seatbelt alarm on, and all needs met.    PM Session:  Pt received deeply sleeping in bed, waking upon OT arrival. Pt presenting to be extremely fatigued following full day of therapy, however in good spirits receptive to skilled OT session reporting 0/10 pain- OT offering intermittent rest breaks, repositioning, and therapeutic support to optimize participation in therapy session. Pt requesting to stay in room for OT session- focused this session on L UE NMRE to increase overall functional use for ADLs. In supine position, facilitated slow prolonged stretch to biceps to decrease tone in preparation for functional tasks. Pt was able to flex bicep through ~50% ROM in gravity eliminated plane and initiate extending elbow, however limited by spasticity. Pt then transitioned to EOB using bed rails with light min A to lift trunk. Sitting EOB, engaged Pt in completing series of exercises to promote NMRE to R UE. With R UE positioned on UE ranger, guided Pt through completing scapular protraction/retraction, isolated bicep flexion/extension, and scapular circumduction with mod A required overall to move through full ROM- Pt with active scapular retraction, however limited protraction. Pt then completed lateral leans onto L UE to promote WB'ing for increased proprioceptive feedback while reaching across midline with R UE to retrieve bean bags with pt able to complete 8 trials with assistance required  at shoulder to  increase stability and mod A to fully lift trunk to midline. Pt requesting to return to bed at end of session. EOB > supine min A. Pt was left resting in bed with call bell in reach, bed alarm on, and all needs met.    Therapy Documentation Precautions:  Precautions Precautions: Fall, Other (comment) Precaution/Restrictions Comments: L hemiplegia, RUE flaccid, HIV(+), pacemaker, dysarthria Splint/Cast - Date Prophylactic Dressing Applied (if applicable):  (prafo boot) Restrictions Weight Bearing Restrictions Per Provider Order: No   Therapy/Group: Individual Therapy  Geoffery Kiel 01/03/2024, 7:59 AM

## 2024-01-04 ENCOUNTER — Other Ambulatory Visit (HOSPITAL_COMMUNITY): Payer: Self-pay

## 2024-01-04 MED ORDER — PANTOPRAZOLE SODIUM 40 MG PO TBEC
40.0000 mg | DELAYED_RELEASE_TABLET | Freq: Every day | ORAL | 0 refills | Status: AC
  Filled 2024-01-04: qty 30, 30d supply, fill #0

## 2024-01-04 MED ORDER — APIXABAN 5 MG PO TABS
5.0000 mg | ORAL_TABLET | Freq: Two times a day (BID) | ORAL | 0 refills | Status: AC
  Filled 2024-01-04: qty 60, 30d supply, fill #0

## 2024-01-04 MED ORDER — EZETIMIBE 10 MG PO TABS
10.0000 mg | ORAL_TABLET | Freq: Every day | ORAL | 0 refills | Status: DC
  Filled 2024-01-04: qty 30, 30d supply, fill #0

## 2024-01-04 MED ORDER — METHOCARBAMOL 500 MG PO TABS
250.0000 mg | ORAL_TABLET | Freq: Four times a day (QID) | ORAL | 0 refills | Status: AC | PRN
  Filled 2024-01-04: qty 30, 15d supply, fill #0

## 2024-01-04 MED ORDER — ASPIRIN 81 MG PO TBEC: 81.0000 mg | DELAYED_RELEASE_TABLET | Freq: Every day | ORAL | Status: AC

## 2024-01-04 MED ORDER — SENNOSIDES-DOCUSATE SODIUM 8.6-50 MG PO TABS
2.0000 | ORAL_TABLET | Freq: Two times a day (BID) | ORAL | Status: DC
  Administered 2024-01-04 – 2024-01-05 (×2): 2 via ORAL
  Filled 2024-01-04 (×2): qty 2

## 2024-01-04 MED ORDER — ATORVASTATIN CALCIUM 40 MG PO TABS
40.0000 mg | ORAL_TABLET | Freq: Every day | ORAL | 0 refills | Status: AC
  Filled 2024-01-04: qty 30, 30d supply, fill #0

## 2024-01-04 MED ORDER — DICLOFENAC SODIUM 1 % EX GEL
2.0000 g | Freq: Four times a day (QID) | CUTANEOUS | 0 refills | Status: AC
  Filled 2024-01-04: qty 100, 13d supply, fill #0

## 2024-01-04 NOTE — Progress Notes (Signed)
 Physical Therapy Session Note  Patient Details  Name: Richard [name] MRN: [id number] Date of Birth: September 20, 1955  Today's Date: 01/03/2024 PT Individual Time: 1104-1208 PT Individual Time Calculation (min): 64 min   Short Term Goals: Week 3:  PT Short Term Goal 1 (Week 3): Pt will perform bed mobility with CGA/ MinA. PT Short Term Goal 1 - Progress (Week 3): Progressing toward goal PT Short Term Goal 2 (Week 3): Pt will perform squat pivot transfers with CGA. PT Short Term Goal 2 - Progress (Week 3): Met PT Short Term Goal 3 (Week 3): Pt will perform sit<>stand transfers with MinA. PT Short Term Goal 3 - Progress (Week 3): Met PT Short Term Goal 4 (Week 3): Pt will ambulate at least 30 ft using LRAD with MinA +2. PT Short Term Goal 4 - Progress (Week 3): Progressing toward goal PT Short Term Goal 5 (Week 3): Pt will initiate stair training. PT Short Term Goal 5 - Progress (Week 3): Met Week 4:  PT Short Term Goal 1 (Week 4): STG = LTG d/t ELOS  Skilled Therapeutic Interventions/Progress Updates:  Patient seated upright in TIS w/c on entrance to room. Patient alert and agreeable to PT session.   Patient with no pain complaint at start of session. Pt uncomfortable in TIS and requesting to sit upright in standard w/c.   Wife present for family education.   Therapeutic Activity: Wife with questions re: HH therapies vs OP therapies. Provided with info re: differing level of neurological focus. HH with ability to see pt and wife within home to gauge safety and present potential recommendations for improvements. Can also focus on ability to leave/ enter home as well as vehicle. Relate to home health their expectations for goals. Then Neuro OPPT recommended. Presented info re: closest neuro OP clinic closest to pt and wife at [facility] location on [address].   Wife then relates need for education re: entering/ exiting home thru back door using w/c. Recent roofer at home has related  that he can place plywood over grass from driveway around to rear patio of home for entry without using steps at front of home. Only threshold entry at rear of home.  Transfers: Pt performed squat/ stand pivot transfers throughout session with wife providing CGA for transfers to R and up to [name] for transfers to L side. Wife is able to position correctly for all and vc provided to ensure pt's footing in proper position and wife able to guard L knee throughout car transfer x2, w/c transfer to differing surfaces x4. Tomorrow to focus on transfers to home surfaces.   Wheelchair Mobility:  5" curb step used to simulate entry into home. Wife and pt provided with verbal instructions. Then demonstrated to wife with verbal instructions for forward entry into home with wife behind pt and backward tilt to bring front wheels up to top of step. Wife able to then forward roll pt with lift of back of chair onto step. PT providing MinA at front of w/c. Then backward roll off step with wife able to control descent. MinA provided at front of w/c. Wife then demos ability to perform with CGA/ supervision from this therapist.   Patient seated upright in standard w/c at end of session with brakes locked, belt alarm set, and all needs within reach.   Therapy Documentation Precautions:  Precautions Precautions: Fall, Other (comment) Precaution/Restrictions Comments: L hemiplegia, RUE flaccid, HIV(+), pacemaker, dysarthria Splint/Cast - Date Prophylactic Dressing Applied (if applicable):  (  prafo boot) Restrictions Weight Bearing Restrictions Per Provider Order: No  Pain:  No pain related this session.    Therapy/Group: Individual Therapy  Donne Gage PT, DPT, CSRS 01/03/2024, 5:54 PM

## 2024-01-04 NOTE — Patient Care Conference (Signed)
 Inpatient RehabilitationTeam Conference and Plan of Care Update Date: 01/04/2024   Time: 10:34 AM    Patient Name: Richard [name]      Medical Record Number: [id number]  Date of Birth: 1956/03/10 Sex: Male         Room/Bed: 4W25C/4W25C-01 Payor Info: Payor: [employer] / Plan: [facility] / Product Type: *No Product type* /    Admit Date/Time:  12/06/2023  1:00 PM  Primary Diagnosis:  Right middle cerebral artery stroke [facility])  Hospital Problems: Principal Problem:   Right middle cerebral artery stroke (HCC) Active Problems:   Chronic systolic congestive heart failure (HCC)   Depression with anxiety   Hyperlipidemia   CKD stage 3a, GFR 45-59 ml/min [facility])    Expected Discharge Date: Expected Discharge Date:  (DME delivery pending)  Team Members Present: Physician leading conference: Dr. Lylia Sand Social Worker Present: Adrianna Albee, LCSW Nurse Present: Forrestine Ike, RN PT Present: Javan Messing, PT OT Present: Florina Husbands, OT SLP Present: Reggie Caper, SLP     Current Status/Progress Goal Weekly Team Focus  Bowel/Bladder   Continent x2. Last BM 5/24. Miralax  and senna given.   Patient to have bowel movements regularly.   Monitor for constipation and medicate prn.    Swallow/Nutrition/ Hydration   D3/thin - preference to use 5cc provale   supervision  tolerance of strategies, family education, pharyngeal strengthening exercises    ADL's   UB dressing min A, LB dressing mod A, U/LB bathing min A, toileting mod A, squat pivot transfers CGA-min A   min A overall   L hemibody NMRE, family education, dynamic balance, activity tolerance, functional transfer training, BADL retraining    Mobility               Communication   ~90% intelligibile at the conversationa level   90% at conversational level given supervisionA   education and use of strategies    Safety/Cognition/ Behavioral Observations  supervision-minA    supervision   STM, basic problem solving, emergent awareness, attention    Pain   Denies pain.   Pain of 0.   Assess for pain q shift and prn.    Skin   CDI   No skin breakdown while in rehab.  Assess skin q shift and prn.      Discharge Planning:  wife has been here for education yesterday and will be here today-[facility]. Will need equipment prior to going home   Team Discussion: Patient post right MCA CVA; limited by spasticity. BP managed per MD and Headaches treated.  Patient on target to meet rehab goals: yes, currently needs ,om assist for upper body bathing and dressing and mod-max assist for lower body care and toileting.  Needs min assist for squat pivot transfers to the left. Continue to work on basic cognition, pharyngeal exercises.   *See Care Plan and progress notes for long and short-term goals.   Revisions to Treatment Plan:  Diet upgraded to a Dysphagia 3 thin liquids; patient prefers a provale cup Car transfer training ? OP botox for spasticity Stretching exercises  Teaching Needs: Safety, medication, dietary modification, transfers, toileting, etc.   Current Barriers to Discharge: Decreased caregiver support, Home enviroment access/layout, and DME access/delivery  Possible Resolutions to Barriers: Family education HH follow up services DME: w/c, 1/2 lap tray, gel foam cushion, hemi-walker, TTB, drop-arm BSC     Medical Summary Current Status: R MCA CVA,  HA, heart failure, CKD, CAD hx, constipation, spasticity, hypotension, anemia  Barriers to Discharge: Hypotension;Self-care education;Medical stability;Renal Insufficiency/Failure  Barriers to Discharge Comments: R MCA CVA, HA, heart failure, CKD, CAD hx, constipation, spasticity, hypotension, anemia Possible Resolutions to [employer] Focus: monitor BP, sorbitol  today, monitor lfts/CBC/BMP, monitor wt   Continued Need for Acute Rehabilitation  Level of Care: The patient requires daily medical management by a physician with specialized training in physical medicine and rehabilitation for the following reasons: Direction of a multidisciplinary physical rehabilitation program to maximize functional independence : Yes Medical management of patient stability for increased activity during participation in an intensive rehabilitation regime.: Yes Analysis of laboratory values and/or radiology reports with any subsequent need for medication adjustment and/or medical intervention. : Yes   I attest that I was present, lead the team conference, and concur with the assessment and plan of the team.   Forrestine Ike B 01/04/2024, 1:40 PM

## 2024-01-04 NOTE — Progress Notes (Signed)
 Recreational Therapy Session Note  Patient Details  Name: Richard [name] MRN: [id number] Date of Birth: 31-Jan-1956 Today's Date: 01/04/2024  Pain: no c/o  Pt participated in animal assisted activity seated w/c level at supervision level.  Wife present and participatory.  Both easily engaged with pet partner team and appreciative of this visit.   [name] 01/04/2024, 4:35 PM

## 2024-01-04 NOTE — Progress Notes (Signed)
 Occupational Therapy Session Note  Patient Details  Name: Richard [name] MRN: [id number] Date of Birth: 08-30-1955  Today's Date: 01/04/2024 OT Individual Time: 1103-1202 OT Individual Time Calculation (min): 59 min  OT Individual Time: 4098-1191 OT Individual Time Calculation (min): 42 min   Short Term Goals: Week 3:  Week 4:  OT Short Term Goal 1 (Week 4): STG=LTG d/t ELOS   Skilled Therapeutic Interventions/Progress Updates:     AM Session:  Pt received sitting up in wc dressed and ready for the day upon OT arrival. Pt presenting to be down in spirits d/t LOS extended 2/2 not having receive the necessary DME from [place] yet. Pt receptive to skilled OT session reporting 0/10 pain- OT offering intermittent rest breaks, repositioning, and therapeutic support to optimize participation in therapy session. Provided therapeutic support and engaged Pt in light hearted conversation to support moral with noted improvement. Focused this session on family education to increase safety prior to d/c. ***  Pt was left resting in *** with call bell in reach, *** alarm on, and all needs met.    PM Session:  Pt received *** presenting to be in good spirits receptive to skilled OT session reporting ***/10 pain- OT offering intermittent rest breaks, repositioning, and therapeutic support to optimize participation in therapy session.   Pt was left resting in *** with call bell in reach, *** alarm on, and all needs met.    Therapy Documentation Precautions:  Precautions Precautions: Fall, Other (comment) Precaution/Restrictions Comments: L hemiplegia, RUE flaccid, HIV(+), pacemaker, dysarthria Splint/Cast - Date Prophylactic Dressing Applied (if applicable):  (prafo boot) Restrictions Weight Bearing Restrictions Per Provider Order: No   Therapy/Group: Individual Therapy  Geoffery Kiel 01/04/2024, 11:01 AM

## 2024-01-04 NOTE — Progress Notes (Signed)
 PROGRESS NOTE   Subjective/Complaints: No new concerns this AM.  Pts DC date to be extended due to equipment needs and for continued work with therapy.  ROS: Patient denies fever, rash, sore throat, blurred vision, dizziness, nausea, vomiting, diarrhea, cough, shortness of breath or chest pain,   or mood change.  + constipation - improved  + lower back pain- reports has this intermittently before admission, controlled + headache- improved  Objective:   No results found.   Recent Labs    01/01/24 2350  WBC 4.6  HGB 12.5*  HCT 37.5*  PLT 153      Recent Labs    01/02/24 0544  NA 140  K 4.0  CL 110  CO2 20*  GLUCOSE 93  BUN 18  CREATININE 1.46*  CALCIUM  9.0        Intake/Output Summary (Last 24 hours) at 01/04/2024 1403 Last data filed at 01/04/2024 1231 Gross per 24 hour  Intake 476 ml  Output 625 ml  Net -149 ml        Physical Exam: Vital Signs Blood pressure 100/69, pulse 86, temperature 98.5 F (36.9 C), resp. rate 16, height 5\' 10"  (1.778 m), weight 72 kg, SpO2 100%.   Constitutional: No distress . Vital signs reviewed.  Sitting in [facility] working with therapy.  HEENT: NCAT, EOMI, oral membranes moist Neck: supple Cardiovascular: RRR without murmur. No JVD    Respiratory/Chest: CTA Bilaterally without wheezes or rales. Normal effort  On RA GI/Abdomen: BS + normoactive, non-tender, non-distended, soft Ext: no clubbing, cyanosis, or edema Psych: pleasant and cooperative  Skin: No evidence of breakdown, no evidence of rash Neurologic: awake and alert . Answers questions. Neurological: Ox3- delayed responses, but appropriate  DTR's 3+ on left. Flexor tone 2+/4 LUE and 1/4 LLE.  Sensory exam normal sensation to light touch right side and LLE absent LT LUE but feels pinch  Musculoskeletal: Full range of motion in all 4 extremities. No joint swelling No significant L spine TTP  Trace strength  grip, 1/5 shoulder abduction on left, 0/5 biceps and triceps, 1/5 knee extensor on L, 0/5 ankle PF and DF on left Stable 5/28  Prior exam Motor strength is 5/5 in right and 0/5 left  deltoid, bicep, tricep, trace grip, hip flexor, knee extensors, ankle dorsiflexor and plantar flexor.   Assessment/Plan: 1. Functional deficits which require 3+ hours per day of interdisciplinary therapy in a comprehensive inpatient rehab setting. Physiatrist is providing close team supervision and 24 hour management of active medical problems listed below. Physiatrist and rehab team continue to assess barriers to discharge/monitor patient progress toward functional and medical goals  Care Tool:  Bathing    Body parts bathed by patient: Left arm, Chest, Abdomen, Front perineal area, Buttocks, Right upper leg, Left upper leg, Face, Right lower leg, Left lower leg   Body parts bathed by helper: Right arm, Buttocks     Bathing assist Assist Level: Moderate Assistance - Patient 50 - 74%     Upper Body Dressing/Undressing Upper body dressing   What is the patient wearing?: Pull over shirt    Upper body assist Assist Level: Minimal Assistance - Patient > 75%  Lower Body Dressing/Undressing Lower body dressing      What is the patient wearing?: Pants, Underwear/pull up     Lower body assist Assist for lower body dressing: Maximal Assistance - Patient 25 - 49%     Toileting Toileting    Toileting assist Assist for toileting: Total Assistance - Patient < 25%     Transfers Chair/bed transfer  Transfers assist  Chair/bed transfer activity did not occur: Safety/medical concerns  Chair/bed transfer assist level: Minimal Assistance - Patient > 75% (squat pivot)     Locomotion Ambulation   Ambulation assist   Ambulation activity did not occur: Safety/medical concerns  Assist level: Maximal Assistance - Patient 25 - 49% Assistive device: Hand held assist Max distance: 70 ft   Walk 10  feet activity   Assist  Walk 10 feet activity did not occur: Safety/medical concerns  Assist level: Maximal Assistance - Patient 25 - 49% Assistive device: Other (comment) (underarm support on L and HHA on R)   Walk 50 feet activity   Assist Walk 50 feet with 2 turns activity did not occur: Safety/medical concerns  Assist level: Maximal Assistance - Patient 25 - 49% Assistive device: Other (comment) (underarm support on L and HHA on R)    Walk 150 feet activity   Assist Walk 150 feet activity did not occur: Safety/medical concerns         Walk 10 feet on uneven surface  activity   Assist Walk 10 feet on uneven surfaces activity did not occur: Safety/medical concerns         Wheelchair     Assist Is the patient using a wheelchair?: Yes Type of Wheelchair: Manual    Wheelchair assist level: Dependent - Patient 0%      Wheelchair 50 feet with 2 turns activity    Assist        Assist Level: Dependent - Patient 0%   Wheelchair 150 feet activity     Assist      Assist Level: Dependent - Patient 0%   Blood pressure 100/69, pulse 86, temperature 98.5 F (36.9 C), resp. rate 16, height 5\' 10"  (1.778 m), weight 72 kg, SpO2 100%.  Medical Problem List and Plan: 1. 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             -patient may  shower             -ELOS/Goals: 5/29 days Min A  -Continue CIR therapies including PT, OT, and SLP    PRAFO and WHO for LLE and LUE  -Expected discharge 5/29  -Team conference today please see physician documentation under team conference tab, met with team  to discuss problems,progress, and goals. Formulized individual treatment plan based on medical history, underlying problem and comorbidities.       2.  Antithrombotics: -DVT/anticoagulation: Eliquis              -antiplatelet therapy: ASA 81mg   daily 3. Pain Management/spasticity: Tylenol  as needed Post CVA HA resolved   5/11- Will add topamax  50 mg at bedtime 5/23 Topamax  reduced to 25mg  per prior plan Bilateral knee pain , aching in am s- OA noted on Xrays in 2023 start diclofenac  gel   -emerging left sided tone.    -5/4 unable to tolerate even low dose baclofen --> stopped    -outpt botox candidate - Pain control overall with Tylenol , continue current regimen -5/19 told pt  that some of his groin pain might be intermitent spasm of his left hip adductors. Continue with ROM, activity with therapy.  -5/21 Will add PRN robaxin, occasional low back pain -5/28 intermittent mild HA- overall controlled with tylenol   4. Mood/Behavior/Sleep: Provide emotional support             -antipsychotic agents: N/A 5. Neuropsych/cognition: This patient is ?capable of making decisions on his own behalf. 6. Skin/Wound Care: Routine skin checks 7. Fluids/Electrolytes/Nutrition: Routine in and outs with follow-up chemistries Intake of fluids improved to yesterday  8.  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  Followed by heart failure team.  Monitor for any signs of fluid overload.  Toprol  XL 12.5 mg daily.  Monitor for any hypotension  .vital Vitals:   01/04/24 0432 01/04/24 1232  BP: 108/71 100/69  Pulse: 63 86  Resp: 18 16  Temp: 97.7 F (36.5 C) 98.5 F (36.9 C)  SpO2: 100% 100%    5/19  BP remains soft off metoprolol ,HR mild brady to normal   5/27-28 BP controlled, continue current regimen Filed Weights   01/01/24 0600 01/02/24 0521 01/04/24 0500  Weight: 71.4 kg 71.9 kg 72 kg   Weight coming down   5/16 NO signs of overload, suspect wt today not accurate  5/27-28 no signs of fluid overload continue to monitor, wt stable overall 9.  CAD/MI/CABG.  Follow-up cardiology services.  Currently maintained on aspirin  and Plavix  10.  VT.  Last episode noted 2023 with successful  ATP. 11.  CKD stage IIIA=  Baseline creatinine 1.3-1.5.  Follow-up chemistries,stable  Creat  5/19 Cr up to 1.7 today, sl above baseline--encourage adequate fluids 5/22 creatinine/BUN a little improved to 1.61/19 5/26 Cr dong to 1.46  Recheck tomorrow     Latest Ref Rng & Units 01/02/2024    5:44 AM 12/29/2023    5:45 AM 12/26/2023    5:57 AM  BMP  Glucose 70 - 99 mg/dL 93  78  87   BUN 8 - 23 mg/dL 18  19  22    Creatinine 0.61 - 1.24 mg/dL 1.02  7.25  3.66   Sodium 135 - 145 mmol/L 140  140  140   Potassium 3.5 - 5.1 mmol/L 4.0  3.9  3.6   Chloride 98 - 111 mmol/L 110  110  112   CO2 22 - 32 mmol/L 20  19  20    Calcium  8.9 - 10.3 mg/dL 9.0  9.2  9.2     12.  HIV.  Continue Dovato - [facility] at the [facility] 13.  Hyperlipidemia.  Lipitor/Zetia   14.  GERD.  Continue Pepcid   15.  Constipation.  MiraLAX  daily, Senokot changed to 2 po BID    LBM 5/17-   5/19 I reviewed kub with pt/wife. Mostly just gaseous distention   -he is feeling better today   -maalox gas if needed, increase miralax  to bid   -encouraged on going po intake   -keep bowels moving  5/20 Consider additional med if no BM today  5/21 Low dose sorbitol    Continue MiraLAX  twice daily, decrease Senokot to 2 tabs HS  5/27 LBM recorded was 5/24, patient denied feeling constipated consider additional medication tomorrow  5/28 pt got PRN sorbitol  today, monitor for results. Senokot to BID   16.  Dysphagia.  Patient notes coughing on current diet of mechanical soft.  Dysphagia 2 thin liquids and have speech therapy follow-up.   MBS and  FEES looked ok   -continue dys 3/thin diet  -eating 100% of meals  17. Daily HA's since stroke- tylenol  + topamax  started 5/11, improved- try reducing dose to 25mg  and monitor   -d/c topamax , continue tylenol   18. Anemia  -5/19 stable 12.9  19, Elevated LFTs  -adjust tylenol  to q6h prn, recheck thurs  - 5/26 improving   20. Hypotension: decrease robaxin to 250mg   21.  Spasticity,  -continue to monitor tone for now  - If worsens Botox may be option for focal spasticity as an outpatient  LOS: 29 days A FACE TO FACE EVALUATION WAS PERFORMED  Lylia Sand 01/04/2024, 2:03 PM

## 2024-01-04 NOTE — Progress Notes (Signed)
 Inpatient Rehabilitation Care Coordinator Discharge Note   Patient Details  Name: Richard [name] MRN: [id number] Date of Birth: 03-Feb-1956   Discharge location: HOME WITH WIFE WHO CAN PROVIDE 24/7 CARE  Length of Stay: 30 DAYS  Discharge activity level: MIN-MOD LEVEL  Home/community participation: ACTIVE  Patient response WU:JWJXBJ Literacy - How often do you need to have someone help you when you read instructions, pamphlets, or other written material from your doctor or pharmacy?: Never  Patient response YN:WGNFAO Isolation - How often do you feel lonely or isolated from those around you?: Never  Services provided included: MD, RD, PT, OT, SLP, RN, CM, TR, Pharmacy, Neuropsych, SW  Financial Services:  Field seismologist Utilized: Dealer  Choices offered to/list presented to: PT AND WIFE  Follow-up services arranged:  Home Health, DME, Patient/Family request agency HH/DME Home Health Agency: [facility]   PT  OT  SP  RN    DME [facility] PORT[facility] RAMP, BED RAIL DROP ARM BEDSIDE COMMODE AND TUB BENCH ORDE [facility] WC HH/DME Requested Agency: [facility]  Patient response to transportation need: Is the patient able to respond to transportation needs?: Yes In the past 12 months, has lack of transportation kept you from medical appointments or from getting medications?: No In the past 12 months, has lack of transportation kept you from meetings, work, or from getting things needed for daily living?: No   Patient/Family verbalized understanding of follow-up arrangements:  Yes  Individual responsible for coordination of the follow-up plan: VERONICA-WIFE [phone]  Confirmed correct DME delivered: Mardell Shade 01/04/2024    Comments (or additional information):WIFE WAS IN FOR EDUCATION FOR TWO DAYS FEELS COMFORTABLE WITH CARE. WILL BORROW EQUIPMENT-WC, PORTABLE AND BSC UNTIL RECEIVES HIS FROM [place]. [facility]FERR[facility]  [facility]  Summary of Stay    Date/Time Discharge Planning CSW  01/04/24 0900 wife has been here for education yesterday and will be here today-not sure [facility]. Will need equipment prior to going home RGD  12/27/23 1508 Will have wife come in for education to make sure can provide the care pt will need. Will need DME recommendations and HH already arranged RGD  12/21/23 0850 Home with wife referral made to [facility] for aide assist, will need family training prior to DC a few days. Wife has been here and observed int therapies RGD  12/14/23 0909 WIfe has been here and observed in therapies wants to take him home. Joanie Mt is if she can prvide the care he will require at discharge. RGD  12/07/23 0844 New evaluation home with wife who does work but can take off and/or can get aide via [place]. Await team's evaluations RGD       Lee Kalt, Maralee Senate

## 2024-01-04 NOTE — Progress Notes (Signed)
 Speech Language Pathology Weekly Progress and Session Note  Patient Details  Name: Richard [name] MRN: [id number] Date of Birth: May 15, 1956  Beginning of progress report period: Dec 28, 2023 End of progress report period: Jan 04, 2024  Today's Date: 01/04/2024 SLP Individual Time: 0901-0931 SLP Individual Time Calculation (min): 30 min  Short Term Goals: Week 4: SLP Short Term Goal 1 (Week 4): STG = LTG due to ELOS SLP Short Term Goal 1 - Progress (Week 4): Progressing toward goal    New Short Term Goals: Week 5: SLP Short Term Goal 1 (Week 5): STG = LTG due to ELOS  Weekly Progress Updates: Pt has made steady gains this reporting period with anticipation of upcoming discharge. Due to delay of discharge additional time warranted to meet LTGs. Pt is currently an overall min to mod A for cognitive tasks and requires min A cues for utilization of swallowing compensatory strategies to minimize overt s/s aspiration with dys 3 solids and thin liquids. Pt/ family education ongoing. Pt would benefit from continued skilled SLP intervention to maximize cognition, intelligibility, and swallowing function in order to maximize his functional independence prior to discharge.  Intensity: Minumum of 1-2 x/day, 30 to 90 minutes Frequency: 1 to 3 out of 7 days Duration/Length of Stay: 5/29 Treatment/Interventions: Cognitive remediation/compensation;Cueing hierarchy;Dysphagia/aspiration precaution training;Functional tasks;Internal/external aids;Patient/family education;Speech/Language facilitation;Therapeutic Activities  Daily Session  Skilled Therapeutic Interventions: Patient was seen in am for family education. Pt alert upon SLP arrival though laying in a fetal position. Pt reporting back pain and headache stating it was ~2-3/10. NSG was notified. Pt was also repositioned upright in bed with improved reported comfort level. His wife was present and an active participant in session. Pt agreeable  for completion of swallowing exercises. He completed IOPI for lingual strengthening. Pt completed 1 set of 10 anteriorly at 15 kPA and 1set of 10 posteriorly at 18 kPa. SLP also guided pt in completion of isometric and isokinetic CTARs. Pt completed 3 sets of 10 reps and 3 sets of 30 second hold given min A for accuracy. SLP also guided completion of effortful swallows where he completed 1 set of 10. At [facility] of session pt was left upright in bed with call button within reach and bed alarm active. SLP to continue POC.   General    Pain Pain Assessment Pain Scale: 0-10 Pain Score: 3  Pain Type: Acute pain Pain Location: Head Pain Intervention(s): Distraction  Therapy/Group: Individual Therapy  Adela Holter 01/04/2024, 11:58 AM

## 2024-01-04 NOTE — Progress Notes (Signed)
 Physical Therapy Discharge Summary  Patient Details  Name: BARAA TUBBS MRN: [id number] Date of Birth: 1955/10/30  Date of Discharge from PT service:Jan 04, 2024  Today's Date: 01/04/2024 PT Individual Time: 0931-1030 PT Individual Time Calculation (min): 59 min    Patient has met {NUMBERS 0-12:18577} of {NUMBERS 0-12:18577} long term goals due to {due WU:9811914}.  Patient to discharge at [facility] level {LOA:3049010}.   Patient's care partner {care partner:3041650} to provide the necessary {assistance:3041652} assistance at discharge.  Reasons goals not met: ***  Recommendation:  Patient will benefit from ongoing skilled PT services in {setting:3041680} to continue to advance safe functional mobility, address ongoing impairments in ***, and minimize fall risk.  Equipment: {equipment:3041657}  Reasons for discharge: {Reason for discharge:3049018}  Patient/family agrees with progress made and goals achieved: {Pt/Family agree with progress/goals:3049020}  PT Discharge Precautions/Restrictions   Vital Signs Therapy Vitals Temp: 97.7 F (36.5 C) Temp Source: Oral Pulse Rate: 63 Resp: 18 BP: 108/71 Patient Position (if appropriate): Lying Oxygen Therapy SpO2: 100 % O2 Device: Room Air Pain Pain Assessment Pain Scale: (P) 0-10 Pain Score: (P) 0-No pain Pain Interference   Vision/Perception     Cognition   Sensation   Motor     Mobility   Locomotion     Trunk/Postural Assessment     Balance   Extremity Assessment            Donne Gage 01/04/2024, 7:58 AM

## 2024-01-04 NOTE — Progress Notes (Signed)
 Patient ID: Richard [name], male   DOB: 10/03/1955, 68 y.o.   MRN: [id number] Met with pt and wife while here for education to discuss without wc or bsc will need to extend his stay another day until can get. Wife called back to let know can borrow wc, portable ramp and bsc from friend so can go home and will have until his own comes from the [place]. Have let team and MD know of this. Plan for discharge home tomorrow.

## 2024-01-05 LAB — CBC
HCT: 39.5 % (ref 39.0–52.0)
Hemoglobin: 13.2 g/dL (ref 13.0–17.0)
MCH: 27.8 pg (ref 26.0–34.0)
MCHC: 33.4 g/dL (ref 30.0–36.0)
MCV: 83.3 fL (ref 80.0–100.0)
Platelets: 164 10*3/uL (ref 150–400)
RBC: 4.74 MIL/uL (ref 4.22–5.81)
RDW: 15.5 % (ref 11.5–15.5)
WBC: 4.5 10*3/uL (ref 4.0–10.5)
nRBC: 0 % (ref 0.0–0.2)

## 2024-01-05 LAB — COMPREHENSIVE METABOLIC PANEL WITH GFR
ALT: 49 U/L — ABNORMAL HIGH (ref 0–44)
AST: 24 U/L (ref 15–41)
Albumin: 3.6 g/dL (ref 3.5–5.0)
Alkaline Phosphatase: 57 U/L (ref 38–126)
Anion gap: 6 (ref 5–15)
BUN: 17 mg/dL (ref 8–23)
CO2: 24 mmol/L (ref 22–32)
Calcium: 9.6 mg/dL (ref 8.9–10.3)
Chloride: 110 mmol/L (ref 98–111)
Creatinine, Ser: 1.42 mg/dL — ABNORMAL HIGH (ref 0.61–1.24)
GFR, Estimated: 54 mL/min — ABNORMAL LOW (ref >60)
Glucose, Bld: 96 mg/dL (ref 70–99)
Potassium: 4 mmol/L (ref 3.5–5.1)
Sodium: 140 mmol/L (ref 135–145)
Total Bilirubin: 0.8 mg/dL (ref 0.0–1.2)
Total Protein: 7.2 g/dL (ref 6.5–8.1)

## 2024-01-05 NOTE — Progress Notes (Signed)
 Occupational Therapy Discharge Summary  Patient Details  Name: Richard [name] MRN: [id number] Date of Birth: Oct 29, 1955  Date of Discharge from OT service:Jan 05, 2024   Patient has met 7 of 9 long term goals due to improved activity tolerance, improved balance, postural control, ability to compensate for deficits, functional use of  LEFT upper and LEFT lower extremity, improved attention, improved awareness, and improved coordination.  Patient to discharge at [facility] Assist level.  Patient's care partner is independent to provide the necessary physical and cognitive assistance at discharge.    Reasons goals not met: Pt requires mod A for LB dressing and toileting; Pt's wife is able to provide the necessary level of assistance  Recommendation:  Patient will benefit from ongoing skilled OT services in home health setting to continue to advance functional skills in the area of BADL and Reduce care partner burden.  Equipment: TTB & BSC  Reasons for discharge: treatment goals met and discharge from hospital  Patient/family agrees with progress made and goals achieved: Yes  OT Discharge Precautions/Restrictions  Precautions Precautions: Fall;Other (comment) Precaution/Restrictions Comments: L hemiplegia, RUE flaccid, HIV(+), pacemaker, dysarthria Restrictions Weight Bearing Restrictions Per Provider Order: No Pain Pain Assessment Pain Scale: 0-10 Pain Score: 0-No pain ADL ADL Eating: Supervision/safety Where Assessed-Eating: Wheelchair Grooming: Minimal assistance Where Assessed-Grooming: Wheelchair Upper Body Bathing: Supervision/safety Where Assessed-Upper Body Bathing: Shower Lower Body Bathing: Minimal assistance Where Assessed-Lower Body Bathing: Shower Upper Body Dressing: Minimal assistance, Supervision/safety Where Assessed-Upper Body Dressing: Wheelchair Lower Body Dressing: Moderate assistance Where Assessed-Lower Body Dressing: Wheelchair Toileting:  Moderate assistance Where Assessed-Toileting: Toilet, Bedside Commode Toilet Transfer: Minimal assistance Toilet Transfer Method: Stand pivot, Squat pivot Toilet Transfer Equipment: Bedside commode Tub/Shower Transfer: Minimal assistance Tub/Shower Transfer Method: Squat pivot, Stand pivot Tub/Shower Equipment: Insurance underwriter: Insurance underwriter Method: Administrator: Emergency planning/management officer, Grab bars Vision Patient Visual Report: No change from baseline Perception  Perception: Impaired Perception-Other Comments: L inattention- imrpoved from eval Praxis Praxis: Impaired Praxis Impairment Details: Motor planning Cognition Cognition Overall Cognitive Status: Impaired/Different from baseline Arousal/Alertness: Awake/alert Orientation Level: Person;Situation;Place Person: Oriented Place: Oriented Situation: Oriented Memory: Impaired Memory Impairment: Decreased recall of new information Attention: Sustained;Focused Focused Attention: Appears intact Focused Attention Impairment: Functional basic Sustained Attention: Appears intact Sustained Attention Impairment: Verbal basic;Functional basic Awareness: Impaired Awareness Impairment: Emergent impairment Problem Solving: Impaired Problem Solving Impairment: Verbal complex;Functional complex Executive Function: Initiating;Self Correcting;Self Monitoring;Sequencing Sequencing: Impaired Initiating: Impaired Self Monitoring: Impaired Self Correcting: Impaired Safety/Judgment: Impaired Brief Interview for Mental Status (BIMS) Repetition of Three Words (First Attempt): 3 Temporal Orientation: Year: Correct Temporal Orientation: Month: Accurate within 5 days Temporal Orientation: Day: Correct Recall: "Sock": Yes, no cue required Recall: "Blue": Yes, no cue required Recall: "Bed": Yes, no cue required BIMS Summary Score: 15 Sensation Sensation Light Touch:  Impaired Detail Light Touch Impaired Details: Impaired LUE;Impaired LLE Hot/Cold: Not tested Proprioception: Impaired by gross assessment Proprioception Impaired Details: Impaired LUE;Impaired LLE Stereognosis: Not tested Coordination Gross Motor Movements are Fluid and Coordinated: No Fine Motor Movements are Fluid and Coordinated: No Coordination and Movement Description: limited by L hemiplegia with decreased trunk control and L inattention - significant improvement in trunk control, improved inattn, continued LUE hemiplegia, LLE hemipareisis Finger Nose Finger Test: unable to complete on L d/t hemiparesis Motor  Motor Motor: Hemiplegia Motor - Discharge Observations: L UE hemiplegia, improved trunk control from eval, and L inattention Mobility  Bed Mobility Bed Mobility: Rolling Right;Rolling  Left;Supine to Sit;Sit to Supine Rolling Right: Contact Guard/Touching assist Rolling Left: Supervision/Verbal cueing Supine to Sit: Contact Guard/Touching assist Sit to Supine: Contact Guard/Touching assist Transfers Sit to Stand: Minimal Assistance - Patient > 75% Stand to Sit: Contact Guard/Touching assist  Trunk/Postural Assessment  Cervical Assessment Cervical Assessment: Exceptions to [facility] (forward head) Thoracic Assessment Thoracic Assessment: Exceptions to [facility] (rounded shoulders) Lumbar Assessment Lumbar Assessment: Exceptions to [facility] (posterior pelvic tilt in sitting) Postural Control Postural Control: Deficits on evaluation Head Control: significantly improved from eval Trunk Control: significantly improved from eval  Balance Balance Balance Assessed: Yes Static Sitting Balance Static Sitting - Balance Support: Feet supported;Right upper extremity supported Static Sitting - Level of Assistance: 5: Stand by assistance Dynamic Sitting Balance Dynamic Sitting - Balance Support: Feet supported;Right upper extremity supported Dynamic Sitting - Level of Assistance: 5: Stand by  assistance Static Standing Balance Static Standing - Balance Support: Right upper extremity supported Static Standing - Level of Assistance: 4: Min assist Dynamic Standing Balance Dynamic Standing - Balance Support: Bilateral upper extremity supported;During functional activity Dynamic Standing - Level of Assistance: 4: Min assist Extremity/Trunk Assessment RUE Assessment RUE Assessment: Within Functional Limits LUE Assessment LUE Assessment: Exceptions to [facility] Passive Range of Motion (PROM) Comments: mildly limited by tone at elbow and in pecotral regions; improves with gentle prolonged stretching to achieve full ROME General Strength Comments: grasp 1/5, wrist flexion/extension 2-/5, bicep/triceps 3-/5, shoudler 2/5   Geoffery Kiel 01/05/2024, 4:32 PM

## 2024-01-05 NOTE — Progress Notes (Signed)
 Speech Language Pathology Discharge Summary  Patient Details  Name: Richard [name] MRN: [id number] Date of Birth: [birth year]  Date of Discharge from SLP service:Jan 05, 2024   Patient has met 3 of 6 long term goals.  Patient to discharge at [facility] level.  Reasons goals not met: cont to require minA for problem solving and memory, minA for swallowing exercises   Clinical Impression/Discharge Summary:  Pt has made fair gains and has met 3 of 6 LTG's this admission due to improved communication, cognition and dysphagia. Pt is currently an overall minA for cognitive tasks and requires min cues for utilization of swallowing compensatory strategies to minimize overt s/sx of aspiration with D3/thin diet. Patient with significant improvements in dysarthria requiring mod I A for use of strategies at the conversational level. Pt/family education complete and pt will discharge home with 24 hour supervision from friends/family/etc. Pt would benefit from [facility] f/u ST services to maximize dysarthria, dysphagia and cognition in order to maximize functional independence.   Care Partner:  Caregiver Able to Provide Assistance: Yes  Type of Caregiver Assistance: Physical;Cognitive  Recommendation:  Home Health SLP;24 hour supervision/assistance  Rationale for SLP Follow Up: Maximize cognitive function and independence;Maximize swallowing safety;Maximize functional communication   Equipment: n/a   Reasons for discharge: Discharged from hospital   Patient/Family Agrees with Progress Made and Goals Achieved: Yes    Srah Ake M.A., CCC-SLP 01/05/2024, 12:26 PM

## 2024-01-05 NOTE — Progress Notes (Signed)
 Patient discharged home with spouse. No further questions and all belongings accounted for. Declined suppository for bowel movement ahead of travel home.

## 2024-01-05 NOTE — Progress Notes (Signed)
 PROGRESS NOTE   Subjective/Complaints: No new complaints or concerns.  No acute events overnight.  ROS: Patient denies fever, rash, sore throat, blurred vision, dizziness, nausea, vomiting, diarrhea, cough, shortness of breath or chest pain,   or mood change.  Denies feeling constipated  + lower back pain- reports has this intermittently before admission, controlled + headache- improved  Objective:   No results found.   Recent Labs    01/05/24 0703  WBC 4.5  HGB 13.2  HCT 39.5  PLT 164      Recent Labs    01/05/24 0703  NA 140  K 4.0  CL 110  CO2 24  GLUCOSE 96  BUN 17  CREATININE 1.42*  CALCIUM  9.6        Intake/Output Summary (Last 24 hours) at 01/05/2024 1738 Last data filed at 01/05/2024 0834 Gross per 24 hour  Intake 236 ml  Output 900 ml  Net -664 ml        Physical Exam: Vital Signs Blood pressure 113/77, pulse 74, temperature 98.1 F (36.7 C), temperature source Oral, resp. rate 16, height 5\' 10"  (1.778 m), weight 74.4 kg, SpO2 100%.   Constitutional: No distress . Vital signs reviewed.  Laying in be.  HEENT: NCAT, EOMI, oral membranes moist Neck: supple Cardiovascular: RRR without murmur. No JVD    Respiratory/Chest: CTA Bilaterally without wheezes or rales. Normal effort  On RA GI/Abdomen: BS + normoactive, non-tender, non-distended, soft Ext: no clubbing, cyanosis, or edema Psych: pleasant and cooperative  Skin: No evidence of breakdown, no evidence of rash Neurologic: awake and alert . Answers questions. Neurological: Ox3- delayed responses, but appropriate  DTR's 3+ on left. Flexor tone 2+/4 LUE and 1/4 LLE.  Sensory exam normal sensation to light touch right side and LLE absent LT LUE but feels pinch  Musculoskeletal: Full range of motion in all 4 extremities. No joint swelling No significant L spine TTP  Trace strength grip, 1/5 shoulder abduction on left, 0/5 biceps and  triceps, 1/5 knee extensor on L, 0/5 ankle PF and DF on left Stable 5/29  Prior exam Motor strength is 5/5 in right and 0/5 left  deltoid, bicep, tricep, trace grip, hip flexor, knee extensors, ankle dorsiflexor and plantar flexor.   Assessment/Plan: 1. Functional deficits which require 3+ hours per day of interdisciplinary therapy in a comprehensive inpatient rehab setting. Physiatrist is providing close team supervision and 24 hour management of active medical problems listed below. Physiatrist and rehab team continue to assess barriers to discharge/monitor patient progress toward functional and medical goals  Care Tool:  Bathing    Body parts bathed by patient: Left arm, Chest, Abdomen, Front perineal area, Buttocks, Right upper leg, Left upper leg, Face, Right lower leg, Left lower leg   Body parts bathed by helper: Right arm, Buttocks     Bathing assist Assist Level: Minimal Assistance - Patient > 75%     Upper Body Dressing/Undressing Upper body dressing   What is the patient wearing?: Pull over shirt    Upper body assist Assist Level: Minimal Assistance - Patient > 75%    Lower Body Dressing/Undressing Lower body dressing      What  is the patient wearing?: Pants, Underwear/pull up     Lower body assist Assist for lower body dressing: Moderate Assistance - Patient 50 - 74%     Toileting Toileting    Toileting assist Assist for toileting: Moderate Assistance - Patient 50 - 74%     Transfers Chair/bed transfer  Transfers assist  Chair/bed transfer activity did not occur: Safety/medical concerns  Chair/bed transfer assist level: Minimal Assistance - Patient > 75%     Locomotion Ambulation   Ambulation assist   Ambulation activity did not occur: Safety/medical concerns  Assist level: Maximal Assistance - Patient 25 - 49% Assistive device: Hand held assist Max distance: 70 ft   Walk 10 feet activity   Assist  Walk 10 feet activity did not occur:  Safety/medical concerns  Assist level: Maximal Assistance - Patient 25 - 49% Assistive device: Hand held assist (HHA)   Walk 50 feet activity   Assist Walk 50 feet with 2 turns activity did not occur: Safety/medical concerns  Assist level: Maximal Assistance - Patient 25 - 49% Assistive device: Hand held assist    Walk 150 feet activity   Assist Walk 150 feet activity did not occur: Safety/medical concerns         Walk 10 feet on uneven surface  activity   Assist Walk 10 feet on uneven surfaces activity did not occur: Safety/medical concerns         Wheelchair     Assist Is the patient using a wheelchair?: Yes Type of Wheelchair: Manual    Wheelchair assist level: Supervision/Verbal cueing      Wheelchair 50 feet with 2 turns activity    Assist        Assist Level: Supervision/Verbal cueing   Wheelchair 150 feet activity     Assist  Wheelchair 150 feet activity did not occur: Safety/medical concerns   Assist Level: Dependent - Patient 0%   Blood pressure 113/77, pulse 74, temperature 98.1 F (36.7 C), temperature source Oral, resp. rate 16, height 5\' 10"  (1.778 m), weight 74.4 kg, SpO2 100%.  Medical Problem List and Plan: 1. 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             -patient may  shower             -ELOS/Goals: 5/29 days Min A  -Continue CIR therapies including PT, OT, and SLP    PRAFO and WHO for LLE and LUE  -Expected discharge 5/29  -DC home today     2.  Antithrombotics: -DVT/anticoagulation: Eliquis              -antiplatelet therapy: ASA 81mg  daily 3. Pain Management/spasticity: Tylenol  as needed Post CVA HA resolved   5/11- Will add topamax  50 mg at bedtime 5/23 Topamax  reduced to 25mg  per prior plan Bilateral knee pain , aching in am s- OA noted on Xrays in 2023 start diclofenac  gel   -emerging  left sided tone.    -5/4 unable to tolerate even low dose baclofen --> stopped    -outpt botox candidate - Pain control overall with Tylenol , continue current regimen -5/19 told pt that some of his groin pain might be intermitent spasm of his left hip adductors. Continue with ROM, activity with therapy.  -5/21 Will add PRN robaxin , occasional low back pain -5/28 intermittent mild HA- overall controlled with tylenol   4. Mood/Behavior/Sleep: Provide emotional support             -  antipsychotic agents: N/A 5. Neuropsych/cognition: This patient is ?capable of making decisions on his own behalf. 6. Skin/Wound Care: Routine skin checks 7. Fluids/Electrolytes/Nutrition: Routine in and outs with follow-up chemistries Intake of fluids improved to yesterday  8.  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  Followed by heart failure team.  Monitor for any signs of fluid overload.  Toprol  XL 12.5 mg daily.  Monitor for any hypotension  .vital Vitals:   01/04/24 2005 01/05/24 0616  BP: 110/76 113/77  Pulse: 79 74  Resp: 18 16  Temp:  98.1 F (36.7 C)  SpO2: 100%     5/19  BP remains soft off metoprolol ,HR mild brady to normal   5/27-29 BP controlled, continue current regimen Filed Weights   01/02/24 0521 01/04/24 0500 01/05/24 0625  Weight: 71.9 kg 72 kg 74.4 kg   Weight coming down   5/16 NO signs of overload, suspect wt today not accurate  5/27-28 no signs of fluid overload continue to monitor, wt stable overall 9.  CAD/MI/CABG.  Follow-up cardiology services.  Currently maintained on aspirin  and Plavix  10.  VT.  Last episode noted 2023 with successful ATP. 11.  CKD stage IIIA=  Baseline creatinine 1.3-1.5.  Follow-up chemistries,stable  Creat  5/19 Cr up to 1.7 today, sl above baseline--encourage adequate fluids 5/22 creatinine/BUN a little improved to 1.61/19 5/26 Cr dong to 1.46 - Cr stable 1.42    Latest Ref Rng & Units 01/05/2024     7:03 AM 01/02/2024    5:44 AM 12/29/2023    5:45 AM  BMP  Glucose 70 - 99 mg/dL 96  93  78   BUN 8 - 23 mg/dL 17  18  19    Creatinine 0.61 - 1.24 mg/dL 4.78  2.95  6.21   Sodium 135 - 145 mmol/L 140  140  140   Potassium 3.5 - 5.1 mmol/L 4.0  4.0  3.9   Chloride 98 - 111 mmol/L 110  110  110   CO2 22 - 32 mmol/L 24  20  19    Calcium  8.9 - 10.3 mg/dL 9.6  9.0  9.2     12.  HIV.  Continue Dovato - followed at the [facility] 13.  Hyperlipidemia.  Lipitor/Zetia   14.  GERD.  Continue Pepcid   15.  Constipation.  MiraLAX  daily, Senokot changed to 2 po BID    LBM 5/17-   5/19 I reviewed kub with pt/wife. Mostly just gaseous distention   -he is feeling better today   -maalox gas if needed, increase miralax  to bid   -encouraged on going po intake   -keep bowels moving  5/20 Consider additional med if no BM today  5/21 Low dose sorbitol    Continue MiraLAX  twice daily, decrease Senokot to 2 tabs HS  5/27 LBM recorded was 5/24, patient denied feeling constipated consider additional medication tomorrow  5/28 pt got PRN sorbitol  today, monitor for results. Senokot to BID   16.  Dysphagia.  Patient notes coughing on current diet of mechanical soft.  Dysphagia 2 thin liquids and have speech therapy follow-up.   MBS and FEES looked ok   -continue dys 3/thin diet  -eating 100% of meals  17. Daily HA's since stroke- tylenol  + topamax  started 5/11, improved- try reducing dose to 25mg  and monitor   -d/c topamax , continue tylenol   18. Anemia  -5/29 improved HGB 13.2  19, Elevated LFTs  -adjust tylenol  to q6h prn, recheck thurs  -  5/26 improving   20. Hypotension: decrease robaxin to 250mg      01/05/2024    6:25 AM 01/05/2024    6:16 AM 01/04/2024    8:05 PM  Vitals with BMI  Weight 164 lbs    BMI 23.53    Systolic  113 110  Diastolic  77 76  Pulse  74 79     21. Spasticity,  -continue to monitor tone for now  - If worsens Botox may be option for focal spasticity as an  outpatient- f/u outpatient PM&R  LOS: 30 days A FACE TO FACE EVALUATION WAS PERFORMED  Lylia Sand 01/05/2024, 5:38 PM

## 2024-01-05 NOTE — Plan of Care (Signed)
  Problem: RH Swallowing Goal: LTG Patient will consume least restrictive diet using compensatory strategies with assistance (SLP) Description: LTG:  Patient will consume least restrictive diet using compensatory strategies with assistance (SLP) Outcome: Not Met (cont to require minA)   Problem: RH Problem Solving Goal: LTG Patient will demonstrate problem solving for (SLP) Description: LTG:  Patient will demonstrate problem solving for basic/complex daily situations with cues  (SLP) Outcome: Not Met (cont to require minA)   Problem: RH Memory Goal: LTG Patient will use memory compensatory aids to (SLP) Description: LTG:  Patient will use memory compensatory aids to recall biographical/new, daily complex information with cues (SLP) Outcome: Not Met (cont to require minA)   Problem: RH Expression Communication Goal: LTG Patient will increase speech intelligibility (SLP) Description: LTG: Patient will increase speech intelligibility at word/phrase/conversation level with cues, % of the time (SLP) Outcome: Completed/Met   Problem: RH Attention Goal: LTG Patient will demonstrate this level of attention during functional activites (SLP) Description: LTG:  Patient will will demonstrate this level of attention during functional activites (SLP) Outcome: Completed/Met   Problem: RH Awareness Goal: LTG: Patient will demonstrate awareness during functional activites type of (SLP) Description: LTG: Patient will demonstrate awareness during functional activites type of (SLP) Outcome: Completed/Met

## 2024-01-05 NOTE — Plan of Care (Signed)
  Problem: RH Balance Goal: LTG Patient will maintain dynamic sitting balance (PT) Description: LTG:  Patient will maintain dynamic sitting balance with assistance during mobility activities (PT) Flowsheets (Taken 12/08/2023 0712) LTG: Pt will maintain dynamic sitting balance during mobility activities with:: Supervision/Verbal cueing Goal: LTG Patient will maintain dynamic standing balance (PT) Description: LTG:  Patient will maintain dynamic standing balance with assistance during mobility activities (PT) Flowsheets (Taken 12/08/2023 0712) LTG: Pt will maintain dynamic standing balance during mobility activities with:: Moderate Assistance - Patient 50 - 74%   Problem: Sit to Stand Goal: LTG:  Patient will perform sit to stand with assistance level (PT) Description: LTG:  Patient will perform sit to stand with assistance level (PT) Flowsheets (Taken 12/08/2023 0712) LTG: PT will perform sit to stand in preparation for functional mobility with assistance level: Contact Guard/Touching assist Note: CGA to initiate but requires max cues or ModA until positions self with steady handhold in order to maintain stance.   Problem: RH Bed Mobility Goal: LTG Patient will perform bed mobility with assist (PT) Description: LTG: Patient will perform bed mobility with assistance, with/without cues (PT). Flowsheets (Taken 12/08/2023 0712) LTG: Pt will perform bed mobility with assistance level of: Contact Guard/Touching assist   Problem: RH Bed to Chair Transfers Goal: LTG Patient will perform bed/chair transfers w/assist (PT) Description: LTG: Patient will perform bed to chair transfers with assistance (PT). Flowsheets (Taken 12/08/2023 0712) LTG: Pt will perform Bed to Chair Transfers with assistance level: Minimal Assistance - Patient > 75% Note: CGA to R Light MinA to L   Problem: RH Car Transfers Goal: LTG Patient will perform car transfers with assist (PT) Description: LTG: Patient will perform car  transfers with assistance (PT). Flowsheets (Taken 12/08/2023 0712) LTG: Pt will perform car transfers with assist:: Minimal Assistance - Patient > 75% Note: MinA into car on passenger side; CGA to exit vehicle   Problem: RH Ambulation Goal: LTG Patient will ambulate in controlled environment (PT) Description: LTG: Patient will ambulate in a controlled environment, # of feet with assistance (PT). Flowsheets (Taken 12/08/2023 0712) LTG: Pt will ambulate in controlled environ  assist needed:: Moderate Assistance - Patient 50 - 74% LTG: Ambulation distance in controlled environment: up to 60 ft using LRAD Note: ModA for maintaining balance, intermittent LLE advancement, and cueing throughout for sequencing   Problem: RH Wheelchair Mobility Goal: LTG Patient will propel w/c in controlled environment (PT) Description: LTG: Patient will propel wheelchair in controlled environment, # of feet with assist (PT) Flowsheets (Taken 12/08/2023 0712) LTG: Pt will propel w/c in controlled environ  assist needed:: Supervision/Verbal cueing LTG: Propel w/c distance in controlled environment: up to 100 ft Goal: LTG Patient will propel w/c in home environment (PT) Description: LTG: Patient will propel wheelchair in home environment, # of feet with assistance (PT). Flowsheets (Taken 12/08/2023 0712) LTG: Pt will propel w/c in home environ  assist needed:: Supervision/Verbal cueing Distance: wheelchair distance in controlled environment: 100 LTG: Propel w/c distance in home environment: up to 50 ft Note: Requires cueing to perform d/t reduced attn to L side.

## 2024-01-05 NOTE — Progress Notes (Incomplete)
 Patient appears to be resting comfortable upon rounding and asleep, respiration even and unlabored on room air,coloration adequate. Continue to verbalize discomfort with constipation provided schedule meds and prune juice, po fluids encouraged,monitor closely

## 2024-01-05 NOTE — Progress Notes (Signed)
 Inpatient Rehabilitation Admission Medication Review by a Pharmacist  A complete drug regimen review was completed for this patient to identify any potential clinically significant medication issues.  High Risk Drug Classes Is patient taking? Indication by Medication  Antipsychotic No   Anticoagulant Yes Apixaban  - AFib  Antibiotic No   Opioid No   Antiplatelet No   Hypoglycemics/insulin No   Vasoactive Medication No   Chemotherapy No   Other Yes Diclofenac  - arthritis Methocarbamol - muscle spasm Pantoprazole  - Reflux  Atorvastatin  - HLD Duvoato - Anti-viral Ezetimibe  - HLD     Type of Medication Issue Identified Description of Issue Recommendation(s)  Drug Interaction(s) (clinically significant)     Duplicate Therapy     Allergy     No Medication Administration End Date     Incorrect Dose     Additional Drug Therapy Needed     Significant med changes from prior encounter (inform family/care partners about these prior to discharge).    Other       Clinically significant medication issues were identified that warrant physician communication and completion of prescribed/recommended actions by midnight of the next day:  No  Name of provider notified for urgent issues identified:   Provider Method of Notification:     Pharmacist comments: None  Time spent performing this drug regimen review (minutes):  20 minutes  Thank you. Lennice Quivers, PharmD

## 2024-01-05 NOTE — Plan of Care (Signed)
  Problem: RH Dressing Goal: LTG Patient will perform lower body dressing w/assist (OT) Description: LTG: Patient will perform lower body dressing with assist, with/without cues in positioning using equipment (OT) Outcome: Adequate for Discharge Note: Pt requires mod A for LB dressing; Pt's wife is able to provide the necessary level of assistance   Problem: RH Toileting Goal: LTG Patient will perform toileting task (3/3 steps) with assistance level (OT) Description: LTG: Patient will perform toileting task (3/3 steps) with assistance level (OT)  Outcome: Adequate for Discharge Note: Pt requires mod A for toileting; Pt's wife is able to provide the necessary level of assistance   Problem: RH Balance Goal: LTG: Patient will maintain dynamic sitting balance (OT) Description: LTG:  Patient will maintain dynamic sitting balance with assistance during activities of daily living (OT) Outcome: Completed/Met Goal: LTG Patient will maintain dynamic standing with ADLs (OT) Description: LTG:  Patient will maintain dynamic standing balance with assist during activities of daily living (OT)  Outcome: Completed/Met   Problem: Sit to Stand Goal: LTG:  Patient will perform sit to stand in prep for activites of daily living with assistance level (OT) Description: LTG:  Patient will perform sit to stand in prep for activites of daily living with assistance level (OT) Outcome: Completed/Met   Problem: RH Grooming Goal: LTG Patient will perform grooming w/assist,cues/equip (OT) Description: LTG: Patient will perform grooming with assist, with/without cues using equipment (OT) Outcome: Completed/Met   Problem: RH Bathing Goal: LTG Patient will bathe all body parts with assist levels (OT) Description: LTG: Patient will bathe all body parts with assist levels (OT) Outcome: Completed/Met   Problem: RH Dressing Goal: LTG Patient will perform upper body dressing (OT) Description: LTG Patient will perform  upper body dressing with assist, with/without cues (OT). Outcome: Completed/Met   Problem: RH Functional Use of Upper Extremity Goal: LTG Patient will use RT/LT upper extremity as a (OT) Description: LTG: Patient will use right/left upper extremity as a stabilizer/gross assist/diminished/nondominant/dominant level with assist, with/without cues during functional activity (OT) Outcome: Completed/Met

## 2024-01-06 ENCOUNTER — Other Ambulatory Visit (HOSPITAL_COMMUNITY): Payer: Self-pay

## 2024-01-06 ENCOUNTER — Ambulatory Visit (INDEPENDENT_AMBULATORY_CARE_PROVIDER_SITE_OTHER): Payer: Medicare (Managed Care)

## 2024-01-06 DIAGNOSIS — I255 Ischemic cardiomyopathy: Secondary | ICD-10-CM

## 2024-01-06 LAB — CUP PACEART REMOTE DEVICE CHECK
Battery Remaining Longevity: 29 mo
Battery Remaining Percentage: 26 %
Battery Voltage: 2.89 V
Brady Statistic RV Percent Paced: 1 %
Date Time Interrogation Session: 20250530020018
HighPow Impedance: 75 Ohm
HighPow Impedance: 75 Ohm
Implantable Lead Connection Status: 753985
Implantable Lead Implant Date: 20160202
Implantable Lead Location: 753860
Implantable Pulse Generator Implant Date: 20160202
Lead Channel Impedance Value: 390 Ohm
Lead Channel Pacing Threshold Amplitude: 1.25 V
Lead Channel Pacing Threshold Pulse Width: 0.5 ms
Lead Channel Sensing Intrinsic Amplitude: 10.7 mV
Lead Channel Setting Pacing Amplitude: 2.5 V
Lead Channel Setting Pacing Pulse Width: 0.5 ms
Lead Channel Setting Sensing Sensitivity: 0.5 mV
Pulse Gen Serial Number: 7229147

## 2024-01-09 ENCOUNTER — Telehealth: Payer: Self-pay

## 2024-01-09 NOTE — Telephone Encounter (Addendum)
 Transitional Care Call--who you spoke with  wife [name] [phone].   Are you/is patient experiencing any problems since coming home? None.  Are there any questions regarding any aspect of care? No.  Are there any questions regarding medications administration/dosing?All medications received except Senokot. Are meds being taken as prescribed? Yes.   Patient should review meds with caller to confirm. Medications reviewed.  Have there been any falls? None. Has Home Health been to the house and/or have they contacted you? Yes.  If not, have you tried to contact them? No. Can we help you contact them? Mrs. Stmarie has been advise to call if any assistance is need.  Are bowels and bladder emptying properly? He is has not had BM today or yesterday. Mrs. Azzara has been advised to give him the Miralax  and purchase the Senokot (as prescribed).  Are there any unexpected incontinence issues? No.   If applicable, is patient following bowel/bladder programs? No taking the Miralax  or Senokot. Any fevers, problems with breathing, unexpected pain? Patient complained of tenderness on his left heel. I advised her to elevate the heel with towel to protect it. She said she has been elevating the heel. Also the "boot" he uses will not stay on in bed. Are there any skin problems or new areas of breakdown? Left heel tenderness. I advised her to call the PCP for advise.  Has the patient/family member arranged specialty MD follow up (ie cardiology/neurology/renal/surgical/etc)? Appointments reviewed.  Can we help arrange? No. Does the patient need any other services or support that we can help arrange? No. Are caregivers following through as expected in assisting the patient? Yes.         11. Has the patient quit smoking, drinking alcohol, or using drugs as recommended? Advise.  (Per wife Mr. Bohman has a appointment on 01/10/2024 with his PCP. Information shared with Ford Ide NP on  01/10/2024).   Appointment  [address] with Ford Ide NP on 01/18/2024  at 2:20 PM, with a 2:00 PM arrival.

## 2024-01-10 DIAGNOSIS — E785 Hyperlipidemia, unspecified: Secondary | ICD-10-CM | POA: Diagnosis not present

## 2024-01-10 DIAGNOSIS — D696 Thrombocytopenia, unspecified: Secondary | ICD-10-CM | POA: Diagnosis not present

## 2024-01-10 DIAGNOSIS — R7303 Prediabetes: Secondary | ICD-10-CM | POA: Diagnosis not present

## 2024-01-10 DIAGNOSIS — N1832 Chronic kidney disease, stage 3b: Secondary | ICD-10-CM | POA: Diagnosis not present

## 2024-01-10 DIAGNOSIS — D649 Anemia, unspecified: Secondary | ICD-10-CM | POA: Diagnosis not present

## 2024-01-11 ENCOUNTER — Ambulatory Visit: Payer: Self-pay | Admitting: Cardiovascular Disease

## 2024-01-13 ENCOUNTER — Ambulatory Visit: Payer: Medicare Other

## 2024-01-16 ENCOUNTER — Ambulatory Visit: Payer: Medicare (Managed Care) | Attending: Cardiovascular Disease | Admitting: Physician Assistant

## 2024-01-16 ENCOUNTER — Encounter: Payer: Self-pay | Admitting: Physician Assistant

## 2024-01-16 VITALS — BP 100/66 | HR 73 | Ht 70.0 in

## 2024-01-16 DIAGNOSIS — I2510 Atherosclerotic heart disease of native coronary artery without angina pectoris: Secondary | ICD-10-CM | POA: Diagnosis not present

## 2024-01-16 DIAGNOSIS — Z8673 Personal history of transient ischemic attack (TIA), and cerebral infarction without residual deficits: Secondary | ICD-10-CM | POA: Diagnosis not present

## 2024-01-16 DIAGNOSIS — I5020 Unspecified systolic (congestive) heart failure: Secondary | ICD-10-CM | POA: Diagnosis not present

## 2024-01-16 DIAGNOSIS — N1831 Chronic kidney disease, stage 3a: Secondary | ICD-10-CM | POA: Diagnosis not present

## 2024-01-16 DIAGNOSIS — I472 Ventricular tachycardia, unspecified: Secondary | ICD-10-CM

## 2024-01-16 NOTE — Progress Notes (Signed)
 Cardiology Office Note:  .   Date:  01/16/2024  ID:  Richard [name], DOB 05/28/1956, MRN [id number] PCP: Richard Batters, MD  [facility] Providers Cardiologist:  Richard Schuller, MD    History of Present Illness: .   Richard [name] is a 68 y.o. male   with HFrEF due to severe ICM s/p St. Jude ICD, VT/VF requiring ICD shocks, CVA in 2008, HIV, schizophrenia, and MDD.     Most recent VT episode in 2023, monomorphic lasting 255 ms successfully terminated by ATP.   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 Followed by neuro.  Patient here with his wife. He's getting PT at home now. In a wheelchair and unable to walk. BP runniing low but no dizziness. low dose metoprolol  was stopped in the hospital due to low BP's. Off jardiance  as well. Was seen at the [place] the is am and had blood work. Some ankle edema. Eating pot pies since home from the hospital.  ROS:    Studies Reviewed: Richard [name]    EKG Interpretation Date/Time:  Monday January 16 2024 13:02:02 EDT Ventricular Rate:  73 PR Interval:  164 QRS Duration:  106 QT Interval:  296 QTC Calculation: 326 R Axis:   262  Text Interpretation: Normal sinus rhythm Right superior axis deviation Nonspecific T wave abnormality When compared with ECG of 03-Feb-2023 08:54, Previous ECG has undetermined rhythm, needs review Nonspecific T wave abnormality now evident in Inferior leads QT has shortened Confirmed by Richard [name] [phone]) on 01/16/2024 1:12:49 PM    Prior CV Studies:   ECHOCARDIOGRAM COMPLETE BUBBLE STUDY Result Date: 12/02/2023    ECHOCARDIOGRAM REPORT   Patient Name:   Richard [name] Date of Exam: 12/02/2023 Medical Rec #:  [id number]           Height:       70.0 in Accession #:    [id number]          Weight:       173.3 lb Date of Birth:  21-Dec-1955           BSA:          1.964 m Patient Age:    68 years            BP:           107/69 mmHg Patient Gender: M                   HR:            85 bpm. Exam Location:  Inpatient Procedure: 2D Echo, Cardiac Doppler and Color Doppler (Both Spectral and Color            Flow Doppler were utilized during procedure). Indications:    Stroke  History:        Patient has prior history of Echocardiogram examinations, most                 recent 03/03/2023. Cardiomyopathy and CHF, CAD, Abnormal ECG and                 Defibrillator, Stroke, Arrythmias:VT, Signs/Symptoms:Altered                 Mental Status; Risk Factors:Hypertension and Dyslipidemia.  Sonographer:    Richard [name] RDCS Referring Phys: [id number] Richard [name] IMPRESSIONS  1. Left ventricular ejection fraction, by estimation, is 20 to 25%. The left ventricle has severely decreased function. The left  ventricle demonstrates global hypokinesis. The left ventricular internal cavity size was mildly dilated. There is mild concentric left ventricular hypertrophy. Left ventricular diastolic parameters are consistent with Grade I diastolic dysfunction (impaired relaxation).  2. Right ventricular systolic function is moderately reduced. The right ventricular size is mildly enlarged. The estimated right ventricular systolic pressure is 16.5 mmHg.  3. Left atrial size was mildly dilated.  4. The mitral valve is normal in structure. Moderate mitral valve regurgitation. No evidence of mitral stenosis.  5. The aortic valve is tricuspid. There is mild calcification of the aortic valve. Aortic valve regurgitation is not visualized. No aortic stenosis is present.  6. The inferior vena cava is normal in size with greater than 50% respiratory variability, suggesting right atrial pressure of 3 mmHg.  7. Agitated saline contrast bubble study was negative, with no evidence of any interatrial shunt. FINDINGS  Left Ventricle: Left ventricular ejection fraction, by estimation, is 20 to 25%. The left ventricle has severely decreased function. The left ventricle demonstrates global hypokinesis. The left ventricular internal  cavity size was mildly dilated. There is mild concentric left ventricular hypertrophy. Left ventricular diastolic parameters are consistent with Grade I diastolic dysfunction (impaired relaxation). Right Ventricle: The right ventricular size is mildly enlarged. No increase in right ventricular wall thickness. Right ventricular systolic function is moderately reduced. The tricuspid regurgitant velocity is 1.84 m/s, and with an assumed right atrial pressure of 3 mmHg, the estimated right ventricular systolic pressure is 16.5 mmHg. Left Atrium: Left atrial size was mildly dilated. Right Atrium: Right atrial size was normal in size. Pericardium: There is no evidence of pericardial effusion. Mitral Valve: The mitral valve is normal in structure. Moderate mitral valve regurgitation. No evidence of mitral valve stenosis. Tricuspid Valve: The tricuspid valve is normal in structure. Tricuspid valve regurgitation is trivial. Aortic Valve: The aortic valve is tricuspid. There is mild calcification of the aortic valve. Aortic valve regurgitation is not visualized. No aortic stenosis is present. Pulmonic Valve: The pulmonic valve was normal in structure. Pulmonic valve regurgitation is trivial. Aorta: The aortic root is normal in size and structure. Venous: The inferior vena cava is normal in size with greater than 50% respiratory variability, suggesting right atrial pressure of 3 mmHg. IAS/Shunts: No atrial level shunt detected by color flow Doppler. Agitated saline contrast bubble study was negative, with no evidence of any interatrial shunt. Additional Comments: A device lead is visualized in the right ventricle.  LEFT VENTRICLE PLAX 2D LVIDd:         6.20 cm      Diastology LVIDs:         5.70 cm      LV e' medial:    4.24 cm/s LV PW:         1.40 cm      LV E/e' medial:  11.1 LV IVS:        1.20 cm      LV e' lateral:   4.90 cm/s LVOT diam:     2.20 cm      LV E/e' lateral: 9.6 LV SV:         66 LV SV Index:   33 LVOT Area:      3.80 cm  LV Volumes (MOD) LV vol d, MOD A2C: 187.0 ml LV vol d, MOD A4C: 177.0 ml LV vol s, MOD A2C: 115.0 ml LV vol s, MOD A4C: 131.0 ml LV SV MOD A2C:     72.0 ml LV SV MOD A4C:  177.0 ml LV SV MOD BP:      57.6 ml RIGHT VENTRICLE            IVC RV S prime:     6.96 cm/s  IVC diam: 1.10 cm TAPSE (M-mode): 1.2 cm LEFT ATRIUM           Index        RIGHT ATRIUM           Index LA diam:      2.20 cm 1.12 cm/m   RA Area:     10.90 cm LA Vol (A2C): 25.8 ml 13.14 ml/m  RA Volume:   23.40 ml  11.92 ml/m LA Vol (A4C): 37.6 ml 19.15 ml/m  AORTIC VALVE LVOT Vmax:   101.00 cm/s LVOT Vmean:  63.900 cm/s LVOT VTI:    0.173 m  AORTA Ao Root diam: 3.70 cm MITRAL VALVE                TRICUSPID VALVE MV Area (PHT): 3.99 cm     TR Peak grad:   13.5 mmHg MV Decel Time: 190 msec     TR Vmax:        184.00 cm/s MV E velocity: 47.20 cm/s MV A velocity: 111.00 cm/s  SHUNTS MV E/A ratio:  0.43         Systemic VTI:  0.17 m                             Systemic Diam: 2.20 cm Dalton McleanMD Electronically signed by Archer Bear Signature Date/Time: 12/02/2023/2:08:55 PM    Final     MR BRAIN WO CONTRAST Result Date: 12/02/2023 CLINICAL DATA:  Stroke workup EXAM: MRI HEAD WITHOUT CONTRAST TECHNIQUE: Multiplanar, multiecho pulse sequences of the brain and surrounding structures were obtained without intravenous contrast. COMPARISON:  Head CT and CTA from yesterday FINDINGS: Brain: Patchy acute infarction in the right MCA distribution along the temporal cortex/white matter, posterior insula (which is thin and may have been previously affected by infarct), and posterior striatum/corona radiata. New acute hemorrhage, hydrocephalus, mass, or collection. Vascular: Absent right MCA flow void at the level of the lower insula, noted angiographic findings. Absent left ICA flow void with intracranial normalization. Skull and upper cervical spine: Normal marrow signal Sinuses/Orbits: Negative IMPRESSION: Moderate acute infarction in  the right MCA distribution. Slow or absent flow in the right MCA beginning at the lower sylvian fissure. 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 Electronically Signed   By: Ronnette Coke M.D.   On: 12/02/2023 11:26     Risk Assessment/Calculations:             Physical Exam:   VS:  BP 100/66   Pulse 73   Ht 5\' 10"  (1.778 m)   SpO2 99%   BMI 23.53 kg/m    Wt Readings from Last 3 Encounters:  01/05/24 164 lb 0.4 oz (74.4 kg)  12/01/23 173 lb 4.5 oz (78.6 kg)  03/08/23 189 lb 12.8 oz (86.1 kg)    GEN: Thin, in no acute distress NECK: No JVD; No carotid bruits CARDIAC:  RRR, no murmurs, rubs, gallops RESPIRATORY:  Clear to auscultation without rales, wheezing or rhonchi  ABDOMEN: Soft, non-tender, non-distended EXTREMITIES:  trace edema; No deformity   ASSESSMENT AND PLAN: .      HFrEF due to severe ICM. EF 20-25% in 2015. - intermittent history of noncompliance - s/p St. Jude single  lead ICD in 2015. Enrolled in iCM. - last echo 11/2023 with EF 20-25%, RWMA, G1DD, mildly reduced RV  - trace ankle edema-getting too much salt-2 gm sodium diet reviewed - Restart Toprol  XL 12.5 mg daily if systolic BP>110 systolic - Prev off spiro d/t hypotension   Severe CAD - LHC in 2015 with 80-90% D1, midLAD 80-90%, apical LAD 95%, distal Lcx 95%, and total prox RRA. S/p DES to mid LAD, DES X3 to prox D1, untreated Lcx lesion.  - No chest pain  -was on DAPT indefinitely: ASA + Plavix  but now on ASA with Eliquis  since CVA - continue atorvastatin  40 mg daily. LDL <55. LipoA 95.   VT - Last episode noted in 2023 with successful ATP - Enrolled in iCM. Last interrogation with 1 NSVT 14b in 3/25.     CVA - CT head + for acute ischemic stroke to R MCA and M1 - s/p thrombectomy - MRI head showing acute stroke as above and chronic occlusion of L ICA  CKD 3b - baseline creatinine ~1.3-1.5 was 1.42 01/05/24 -Labs done at [facility]   HIV - on dovato          Dispo: f/u 3-4 months with Dr. Cooper Denver or Alda Amas to get established  Signed, Richard Flake, PA-C

## 2024-01-16 NOTE — Patient Instructions (Addendum)
 Medication Instructions:  Please take Metoprolol  succinate (Toprol -XL) (1/2 tablet) 25 mg total, once daily for Systolic Blood Pressure greater than 110 (top number on BP reading)  Jardiance  discontinued  Lab Work: None  Follow-Up: At [facility], you and your health needs are our priority.  As part of our continuing mission to provide you with exceptional heart care, our providers are all part of one team.  This team includes your primary Cardiologist (physician) and Advanced Practice Providers or APPs (Physician Assistants and Nurse Practitioners) who all work together to provide you with the care you need, when you need it.  Your next appointment:   3 -4 month(s)  Provider:   Dr. Jackquelyn Mass or Dr. Carson Clara  Other Instructions Please restrict your diet to 2gram (2,000 mg) of sodium in a day.  DASH eating plan recommendations: Emphasizes vegetables, fruits, and whole-grains Includes fat-free or low-fat dairy products, fish, poultry, beans, nuts, and vegetable oils Limits foods that are high in saturated fat. These foods include fatty meats, full-fat dairy products, and tropical oils such as coconut, palm kernel, and palm oils. Limits sugar-sweetened beverages and sweets Limiting sodium intake to < 1500 mg/day

## 2024-01-18 ENCOUNTER — Encounter: Admitting: Registered Nurse

## 2024-02-06 ENCOUNTER — Other Ambulatory Visit (HOSPITAL_COMMUNITY): Payer: Self-pay

## 2024-02-22 NOTE — Progress Notes (Signed)
 Remote ICD transmission.

## 2024-02-27 ENCOUNTER — Telehealth: Payer: Self-pay

## 2024-02-27 DIAGNOSIS — I5022 Chronic systolic (congestive) heart failure: Secondary | ICD-10-CM

## 2024-02-27 DIAGNOSIS — I472 Ventricular tachycardia, unspecified: Secondary | ICD-10-CM

## 2024-02-27 NOTE — Telephone Encounter (Signed)
 Can we please get BMET, Mg, BNP labs and schedule office visit this week with me? Add on tomorrow at end of schedule if no other availability please

## 2024-02-27 NOTE — Telephone Encounter (Signed)
 Noted, EP scheduling reaching out now to add patient on with Dr. JAYSON tomorrow at 1040.  I will go ahead and place lab orders for him to have labs drawn downstairs first before coming up to appt.   Patient has been notified and made aware.

## 2024-02-27 NOTE — Telephone Encounter (Signed)
 Alert remote transmission: Successful ATP therapy delivered Event occurred 7/20 @ 09:57, duration 14sec per device, HR 272, EGM c/w sustained VT/VF pace terminated with 1 burst of ATP - HF diagnostics frequently abnormal  Spoke with patient.  He denies any symptoms during time of event.  Also denies any evidence of weight gain or SOB on exertion.  Other than patient having suffered a stroke since he last saw Dr. JAYSON, there have been no other changes to his health, denies any missed doses of medications.   Patient is due for follow up with Dr. Francyne; however, no available appointments until the end of August at present.   Will forward to Dr. Francyne for review and next steps.    [employer] driving restrictions given for 6 months.

## 2024-02-28 ENCOUNTER — Other Ambulatory Visit (HOSPITAL_COMMUNITY): Payer: Self-pay

## 2024-02-28 ENCOUNTER — Encounter: Payer: Self-pay | Admitting: Cardiovascular Disease

## 2024-02-28 ENCOUNTER — Ambulatory Visit: Payer: Medicare (Managed Care) | Attending: Cardiovascular Disease | Admitting: Cardiovascular Disease

## 2024-02-28 VITALS — BP 101/68 | HR 68 | Ht 70.0 in | Wt 165.0 lb

## 2024-02-28 DIAGNOSIS — Z95810 Presence of automatic (implantable) cardiac defibrillator: Secondary | ICD-10-CM | POA: Diagnosis not present

## 2024-02-28 DIAGNOSIS — I2510 Atherosclerotic heart disease of native coronary artery without angina pectoris: Secondary | ICD-10-CM | POA: Diagnosis not present

## 2024-02-28 DIAGNOSIS — I472 Ventricular tachycardia, unspecified: Secondary | ICD-10-CM

## 2024-02-28 DIAGNOSIS — B20 Human immunodeficiency virus [HIV] disease: Secondary | ICD-10-CM | POA: Diagnosis not present

## 2024-02-28 DIAGNOSIS — E785 Hyperlipidemia, unspecified: Secondary | ICD-10-CM | POA: Diagnosis not present

## 2024-02-28 DIAGNOSIS — I5022 Chronic systolic (congestive) heart failure: Secondary | ICD-10-CM | POA: Diagnosis not present

## 2024-02-28 LAB — BASIC METABOLIC PANEL WITH GFR
BUN/Creatinine Ratio: 12 (ref 10–24)
BUN: 16 mg/dL (ref 8–27)
CO2: 21 mmol/L (ref 20–29)
Calcium: 8.9 mg/dL (ref 8.6–10.2)
Chloride: 105 mmol/L (ref 96–106)
Creatinine, Ser: 1.35 mg/dL — ABNORMAL HIGH (ref 0.76–1.27)
Glucose: 101 mg/dL — ABNORMAL HIGH (ref 70–99)
Potassium: 3.9 mmol/L (ref 3.5–5.2)
Sodium: 141 mmol/L (ref 134–144)
eGFR: 58 mL/min/1.73 — ABNORMAL LOW (ref >59)

## 2024-02-28 MED ORDER — METOPROLOL SUCCINATE ER 25 MG PO TB24: ORAL_TABLET | ORAL | 3 refills | Status: DC

## 2024-02-28 MED ORDER — METOPROLOL SUCCINATE ER 25 MG PO TB24: ORAL_TABLET | ORAL | 3 refills | Status: AC

## 2024-02-28 NOTE — Progress Notes (Signed)
 Cardiology office note    Date:  02/28/2024   ID:  Richard [name], DOB 07-29-1956, MRN [id number]  PCP:  Richard Ezekiel NOVAK, MD  Cardiologist:  Debby Sor, MD ; Jackquline Branca (device) Electrophysiologist:  None   Evaluation Performed:  Follow-Up Visit  Chief Complaint: VFib  History of Present Illness:    Richard [name] is a 68 y.o. 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 Jude Fortify Assura VR (not MRI) defibrillator for primary prevention.  He is seen in the office today after receiving appropriate therapy for ventricular fibrillation from his ICD on 02/28/2024 at roughly 9 AM.  He remembers that he was sitting in his recliner watching a religious show.  He did not have dizziness, palpitations or lose consciousness.  He did not have any chest discomfort or shortness of breath.    In fact he has been doing well from a cardiovascular point of view recently without any shortness of breath with activity or chest pain (other than an episode lasting just a few seconds at rest, yesterday).  He denies dizziness or syncope.  He experienced an ischemic stroke in April (right M1 and M2 occlusion, thrombectomy by interventional radiology) and still has relatively dense left hemiparesis, more severe in the upper extremity.  He is also wearing an orthosis for the lower extremity.  After his stroke he was told not to take his metoprolol  if his systolic blood pressure in the morning is lower than 110 mmHg.  This is a common occurrence so he is actually been off his beta-blocker most of the time since April.  He is currently taking aspirin  81 mg daily as well as Eliquis  5 mg twice daily.  He has not had any bleeding problems.  He has not had any new neurological events.  Low blood pressure has  limited his guideline directed medical therapy for heart failure.  We have gradually reduced his dose of spironolactone  to the point that before his stroke he was only taking 12.5 mg 3 days a week.  He since then he has stopped it altogether.  He is not on any other RAAS inhibitors.  At 1 point he was prescribed Jardiance , but I am not sure that this was ever approved by the [facility] which provides most of his medicines  Interrogation of the device shows background normal rhythm with a PVC followed by a pause that seems to trigger polymorphic ventricular tachycardia/ventricular fibrillation with cycle length as short as 211 ms.  There is appropriate detection followed by ATP during charge.  There is a dirty break that occurs roughly 3 seconds after ATP, followed by return to sinus with frequent premature ventricular contractions.  High-voltage therapy was aborted.  Otherwise he has normal ICD function.  Estimated generator longevity is still 2.3 years.  His corvue thoracic impedance has been very stable over the last couple of weeks.  He never needs the device for pacing.  All lead parameters are normal and unchanged from previous visits.  His most recent assessment of left ventricular systolic function was at the time of his stroke when the EF was felt to be 20-25%.  Ever since he had his initial extensive anterolateral myocardial infarction his ejection fraction has been estimated to be anywhere between 20% and 35%, with some variation from study to study.  By nuclear scintigram in 2015 the  EF was 27%.  That is the last time he has had any coronary insufficiency evaluation.  No left ventricular thrombus was seen on the echocardiogram from April 2025.  It was felt that his stroke may have been related to acute on chronic middle cerebral artery stenosis versus cardioembolic from his severely reduced LV function.  There have not been any other changes in his health status.  He has excellent lipid parameters on  prescription of atorvastatin  40 mg daily (total cholesterol 82, HDL 28, LDL 43, triglycerides 53) his most recent hemoglobin A1c was normal at 5.6%.  He has moderate chronic kidney disease with a creatinine baseline around 1.4.  He has longstanding HIV infection that has been very well treated with an undetectable viral load for many years.  Following ICD implantation he has had 2 other appropriate interventions for the device for sustained ventricular tachycardia.  In 2018, following abrupt interruption in his beta-blocker, he had ventricular fibrillation terminated by single shock.  Most recently on November 05, 2021 (monomorphic VT with a cycle length of 255 ms, successfully interrupted with a single cycle of ATP only, no need for shock).  His most recent nuclear stress test in 2015 showed extensive scar without ischemia and the left ventricular ejection fraction of 27%.  He received an appropriate shock for ventricular fibrillation on September 30, 2016, after he been out of his metoprolol  for about a week.  He had ATP for sustained monomorphic VT in March 2023.  He is a disabled Investment banker, operational.  Past Medical History:  Diagnosis Date   Coronary artery disease    a. cath 04/02/2014 3v dx 80-90% in D1, 80-90% mid LAD, 95% apical LAD, 95% distal inferior LV branch of LCx, total occlusion of prox RCA  b). Cath 04/03/2014 DES to mid LAD, DES x3 to prox D1, DAPT indefinitely. LCx lesion untreated   GERD (gastroesophageal reflux disease)    HIV (human immunodeficiency virus infection) (HCC)    Hypertension    Ischemic cardiomyopathy 08/09/2013   EF 20-25 percent by echo in 04/2014, s/p St. Jude Eagle model [id number] serial number [id number]   Stroke [facility])    Past Surgical History:  Procedure Laterality Date   CARDIAC CATHETERIZATION  04/02/2014   DR BURNARD   ESOPHAGOGASTRODUODENOSCOPY (EGD) WITH PROPOFOL  N/A 09/08/2018   Procedure: ESOPHAGOGASTRODUODENOSCOPY (EGD) WITH PROPOFOL ;  Surgeon: Rollin Dover, MD;  Location: [facility] ENDOSCOPY;  Service: Endoscopy;  Laterality: N/A;   IMPLANTABLE CARDIOVERTER DEFIBRILLATOR IMPLANT N/A 09/10/2014   Procedure: IMPLANTABLE CARDIOVERTER DEFIBRILLATOR IMPLANT;  Surgeon: Jerel Balding, MD;  [facility] model [id number] serial number [id number]   IR CT HEAD LTD  12/01/2023   IR PERCUTANEOUS ART THROMBECTOMY/INFUSION INTRACRANIAL INC DIAG ANGIO  12/01/2023   IR US  GUIDE VASC ACCESS RIGHT  12/01/2023   LEFT AND RIGHT HEART CATHETERIZATION WITH CORONARY ANGIOGRAM N/A 04/02/2014   Procedure: LEFT AND RIGHT HEART CATHETERIZATION WITH CORONARY ANGIOGRAM;  Surgeon: Debby DELENA BURNARD, MD;  Location: [facility] CATH LAB;  Service: Cardiovascular;  Laterality: N/A;   MEDIAL COLLATERAL LIGAMENT REPAIR, KNEE Right 08/25/2015   Procedure: RIGHT THUMB RADICAL COLLATERAL LIGAMENT REPAIR;  Surgeon: Donnice Robinsons, MD;  Location: [facility] OR;  Service: Orthopedics;  Laterality: Right;   PERCUTANEOUS CORONARY STENT INTERVENTION (PCI-S) N/A 04/03/2014   Procedure: PERCUTANEOUS CORONARY STENT INTERVENTION (PCI-S);  Surgeon: Peter M Swaziland, MD;  Location: [facility] C[facility] LAB;  Service: Cardiovascular;  Laterality: N/A;   RADIOLOGY WITH ANESTHESIA N/A 12/01/2023   Procedure: RADIOLOGY WITH ANESTHESIA;  Surgeon: Radiologist, Medication, MD;  Location: [facility] OR;  Service: Radiology;  Laterality: N/A;  BETHANY BRADFORD DILATION N/A 09/08/2018   Procedure: SAVORY DILATION;  Surgeon: Rollin Dover, MD;  Location: [facility] ENDOSCOPY;  Service: Endoscopy;  Laterality: N/A;     Current Meds  Medication Sig   acetaminophen  (TYLENOL ) 325 MG tablet Take 2 tablets (650 mg total) by mouth every 6 (six) hours as needed for mild pain (pain score 1-3) (or temp > 37.5 C (99.5 F)).   apixaban  (ELIQUIS ) 5 MG TABS tablet Take 1 tablet (5 mg total) by mouth 2 (two) times daily.   aspirin  EC 81 MG tablet Take 1 tablet (81 mg total) by mouth daily. Swallow whole.   atorvastatin  (LIPITOR) 40 MG tablet Take 1 tablet (40 mg  total) by mouth daily.   cetirizine (ZYRTEC) 10 MG tablet Take 10 mg by mouth daily.   diclofenac  Sodium (VOLTAREN ) 1 % GEL Apply 2 g topically 4 (four) times daily.   dolutegravir -lamiVUDine  (DOVATO ) 50-300 MG tablet Take 1 tablet by mouth daily.   fluticasone  (FLONASE ) 50 MCG/ACT nasal spray Place 1 spray into both nostrils daily.   methocarbamol  (ROBAXIN ) 500 MG tablet Take 0.5 tablets (250 mg total) by mouth every 6 (six) hours as needed for muscle spasms.   pantoprazole  (PROTONIX ) 40 MG tablet Take 1 tablet (40 mg total) by mouth daily.   senna-docusate (SENOKOT-S) 8.6-50 MG tablet Take 2 tablets by mouth at bedtime. (Patient taking differently: Take 2 tablets by mouth as needed.)   sertraline (ZOLOFT) 50 MG tablet Take 25 mg by mouth daily.   [DISCONTINUED] metoprolol  succinate (TOPROL -XL) 50 MG 24 hr tablet Take 25 mg by mouth daily. Take with or immediately following a meal.     Allergies:   Lisinopril , Ioxaglate, and Ivp dye [iodinated contrast media]   Social History   Tobacco Use   Smoking status: Never   Smokeless tobacco: Never  Vaping Use   Vaping status: Never Used  Substance Use Topics   Alcohol use: No   Drug use: No     Family Hx: The patient's family history includes Heart disease in his mother. There is no history of Esophageal cancer or Colon cancer.  ROS:   Please see the history of present illness.    All other systems are reviewed and are negative.   Prior CV studies:   The following studies were reviewed today: Comprehensive ICD device check in the office today, including active testing of capture threshold.  Echocardiogram 12/02/2023    1. Left ventricular ejection fraction, by estimation, is 20 to 25%. The  left ventricle has severely decreased function. The left ventricle  demonstrates global hypokinesis. The left ventricular internal cavity size  was mildly dilated. There is mild  concentric left ventricular hypertrophy. Left ventricular  diastolic  parameters are consistent with Grade I diastolic dysfunction (impaired  relaxation).   2. Right ventricular systolic function is moderately reduced. The right  ventricular size is mildly enlarged. The estimated right ventricular  systolic pressure is 16.5 mmHg.   3. Left atrial size was mildly dilated.   4. The mitral valve is normal in structure. Moderate mitral valve  regurgitation. No evidence of mitral stenosis.   5. The aortic valve is tricuspid. There is mild calcification of the  aortic valve. Aortic valve regurgitation is not visualized. No aortic  stenosis is present.   6. The inferior vena cava is normal in size with greater than 50%  respiratory variability, suggesting  right atrial pressure of 3 mmHg.   7. Agitated saline contrast bubble study was negative, with no evidence  of any interatrial shunt.   Labs/Other Tests and Data Reviewed:    EKG:   EKG Interpretation Date/Time:  Tuesday February 28 2024 10:42:28 EDT Ventricular Rate:  68 PR Interval:  170 QRS Duration:  110 QT Interval:  388 QTC Calculation: 412 R Axis:   -86  Text Interpretation: Normal sinus rhythm Left anterior fascicular block Possible Lateral infarct , age undetermined When compared with ECG of 16-Jan-2024 13:02, Borderline criteria for Lateral infarct are now Present Nonspecific T wave abnormality no longer evident in Inferior leads QT has lengthened Confirmed by Elandra Powell [phone]) on 02/28/2024 11:00:06 AM         Recent Labs: 01/05/2024: ALT 49; BUN 17; Creatinine, Ser 1.42; Hemoglobin 13.2; Platelets 164; Potassium 4.0; Sodium 140  BNP was 321  Recent Lipid Panel Lab Results  Component Value Date/Time   CHOL 82 12/02/2023 05:17 PM   CHOL 82 (L) 06/02/2021 08:34 AM   TRIG 53 12/02/2023 05:17 PM   HDL 28 (L) 12/02/2023 05:17 PM   HDL 33 (L) 06/02/2021 08:34 AM   CHOLHDL 2.9 12/02/2023 05:17 PM   LDLCALC 43 12/02/2023 05:17 PM   LDLCALC 37 06/02/2021 08:34 AM    Wt  Readings from Last 3 Encounters:  02/28/24 165 lb (74.8 kg)  01/05/24 164 lb 0.4 oz (74.4 kg)  12/01/23 173 lb 4.5 oz (78.6 kg)     Objective:    Vital Signs:  BP 101/68 (BP Location: Left Arm, Patient Position: Sitting)   Pulse 68   Ht 5' 10 (1.778 m)   Wt 165 lb (74.8 kg)   SpO2 98%   BMI 23.68 kg/m     General: Alert, oriented x3, no distress, healthy ICD site left subclavian area. Head: no evidence of trauma, PERRL, EOMI, no exophtalmos or lid lag, no myxedema, no xanthelasma; normal ears, nose and oropharynx Neck: normal jugular venous pulsations and no hepatojugular reflux; brisk carotid pulses without delay and no carotid bruits Chest: clear to auscultation, no signs of consolidation by percussion or palpation, normal fremitus, symmetrical and full respiratory excursions Cardiovascular: normal position and quality of the apical impulse, regular rhythm, normal first and second heart sounds, no murmurs, rubs or gallops Abdomen: no tenderness or distention, no masses by palpation, no abnormal pulsatility or arterial bruits, normal bowel sounds, no hepatosplenomegaly Extremities: no clubbing, cyanosis or edema; 2+ radial, ulnar and brachial pulses bilaterally; 2+ right femoral, posterior tibial and dorsalis pedis pulses; 2+ left femoral, posterior tibial and dorsalis pedis pulses; no subclavian or femoral bruits Neurological: grossly nonfocal Psych: Normal mood and affect   ASSESSMENT & PLAN:    1. Ventricular tachycardia (HCC)   2. Chronic systolic heart failure (HCC)   3. Coronary artery disease involving native coronary artery of native heart without angina pectoris   4. ICD (implantable cardioverter-defibrillator) in place   5. Hyperlipidemia LDL goal <70   6. Human immunodeficiency virus (HIV) disease (HCC)      VT/VF: This is his third appropriate intervention from his defibrillator for extremely rapid ventricular arrhythmia.  As before it has happened when his  beta-blocker was discontinued.  He needs to be at least on a nominal amount of metoprolol  succinate 12.5 mg daily on days when his blood pressure is lower, take the full 25 mg dose if his systolic blood pressure is 110.  It has also been 10 years since  he had any assessment for coronary insufficiency, which could also be a trigger for recurrent arrhythmia.  Will schedule him for a Lexiscan  Myoview.  At this point invasive angiography does not appear to be justified.. CHF: He has remarkably good functional status and he appears to be euvolemic despite the fact that he is unable to tolerate much in the way of medical therapy for heart failure.  He is not on loop diuretics.  Unable to take any RAAS inhibitors due to his blood pressure.  Nevertheless he should at least be on a small dose of beta-blocker due to his propensity to have dangerous ventricular tachycardia/ventricular fibrillation. CAD: He did have a recent brief episode of chest discomfort at rest, although this may have been musculoskeletal.  Check Lexiscan  Myoview due to the recent arrhythmia recurrence.  Continue aspirin , statin, low-dose metoprolol . ICD: Normal ICD function on check today.  Appropriate intervention with antitachycardia during charge for ventricular fibrillation.  Continue remote monitoring every 3 months. HLP: Excellent lipid profile performed few months ago.  His labs are routinely checked at the [facility]. HIV: On chronic treatment with Dovato .  Undetectable viral load for many years, monitored at the [facility].     Medication Adjustments/Labs and Tests Ordered: Current medicines are reviewed at length with the patient today.  Concerns regarding medicines are outlined above.   Tests Ordered: Orders Placed This Encounter  Procedures   Brain natriuretic peptide   Basic metabolic panel with GFR   Magnesium   MYOCARDIAL PERFUSION IMAGING   EKG 12-Lead    Medication Changes: Meds ordered this encounter  Medications   DISCONTD:  metoprolol  succinate (TOPROL -XL) 25 MG 24 hr tablet    Sig: Take 1 tablet (25mg ) by mouth if systolic blood pressure is greater than . Take 1/2 tablet (12.5mg ) by mouth with systolic blood pressure is less than . Take with or immediately following a meal.    Dispense:  90 tablet    Refill:  3   metoprolol  succinate (TOPROL -XL) 25 MG 24 hr tablet    Sig: Take 1 tablet (25mg ) by mouth if systolic blood pressure is greater than . Take 1/2 tablet (12.5mg ) by mouth with systolic blood pressure is less than . Take with or immediately following a meal.    Dispense:  90 tablet    Refill:  3    Disposition:  Follow up Remote downloads every 3 months and office visit in a year.  Signed, Jerel Balding, MD  02/28/2024 11:42 AM    [facility]

## 2024-02-28 NOTE — Patient Instructions (Addendum)
 Medication Instructions:  Your physician has recommended you make the following change in your medication:   -Metoprolol  succinate (Toprol -XL) - Take 1 tablet (25mg ) by mouth if systolic blood pressure is greater than . Take 1/2 tablet (12.5mg ) by mouth with systolic blood pressure is less than . Take with or immediately following a meal.  *If you need a refill on your cardiac medications before your next appointment, please call your pharmacy*  Lab Work: Your physician recommends that you have labs drawn today: BMET, Magnesium, BNP  If you have labs (blood work) drawn today and your tests are completely normal, you will receive your results only by: MyChart Message (if you have MyChart) OR A paper copy in the mail If you have any lab test that is abnormal or we need to change your treatment, we will call you to review the results.  Testing/Procedures: Dr. Francyne has ordered a Lexiscan  Myocardial Perfusion Imaging Study.  Please arrive 15 minutes prior to your appointment time for registration and insurance purposes.   The test will take approximately 3 to 4 hours to complete; you may bring reading material.  If someone comes with you to your appointment, they will need to remain in the main lobby due to limited space in the testing area. **If you are pregnant or breastfeeding, please notify the nuclear lab prior to your appointment**   How to prepare for your Myocardial Perfusion Test: Do not eat or drink 3 hours prior to your test, except you may have water. Do not consume products containing caffeine (regular or decaffeinated) 12 hours prior to your test. (ex: coffee, chocolate, sodas, tea). Do wear comfortable clothes (no dresses or overalls) and walking shoes, tennis shoes preferred (No heels or open toe shoes are allowed). Do NOT wear cologne, perfume, aftershave, or lotions (deodorant is allowed). If you use an inhaler, use it the AM of your test and bring it with  you.  If you use a nebulizer, use it the AM of your test.  If these instructions are not followed, your test will have to be rescheduled.   Follow-Up: At [facility], you and your health needs are our priority.  As part of our continuing mission to provide you with exceptional heart care, our providers are all part of one team.  This team includes your primary Cardiologist (physician) and Advanced Practice Providers or APPs (Physician Assistants and Nurse Practitioners) who all work together to provide you with the care you need, when you need it.  Your next appointment:   8 month(s)  Provider:   Jerel Francyne, MD    We recommend signing up for the patient portal called MyChart.  Sign up information is provided on this After Visit Summary.  MyChart is used to connect with patients for Virtual Visits (Telemedicine).  Patients are able to view lab/test results, encounter notes, upcoming appointments, etc.  Non-urgent messages can be sent to your provider as well.   To learn more about what you can do with MyChart, go to [email or web address].

## 2024-02-29 ENCOUNTER — Ambulatory Visit: Payer: Self-pay | Admitting: Cardiovascular Disease

## 2024-02-29 ENCOUNTER — Telehealth: Payer: Self-pay

## 2024-02-29 LAB — MAGNESIUM: Magnesium: 1.9 mg/dL (ref 1.6–2.3)

## 2024-02-29 LAB — PRO B NATRIURETIC PEPTIDE: NT-Pro BNP: 622 pg/mL — ABNORMAL HIGH (ref 0–376)

## 2024-02-29 NOTE — Patient Outreach (Signed)
 Telephone outreach to patient's wife to obtain mRS was successfully completed. MRS= 4  Shereen Gin [facility] Health Care Management Assistant  Direct Dial: [phone]  Fax: [phone] Website: [email or web address]

## 2024-03-01 ENCOUNTER — Telehealth (HOSPITAL_COMMUNITY): Payer: Self-pay | Admitting: *Deleted

## 2024-03-01 NOTE — Telephone Encounter (Signed)
 Per DPR gave instructions for stress to to pt's wife.

## 2024-03-07 ENCOUNTER — Telehealth: Payer: Self-pay | Admitting: Cardiovascular Disease

## 2024-03-07 ENCOUNTER — Other Ambulatory Visit: Payer: Self-pay | Admitting: Cardiovascular Disease

## 2024-03-07 DIAGNOSIS — I2510 Atherosclerotic heart disease of native coronary artery without angina pectoris: Secondary | ICD-10-CM

## 2024-03-07 DIAGNOSIS — I5022 Chronic systolic (congestive) heart failure: Secondary | ICD-10-CM

## 2024-03-07 DIAGNOSIS — I472 Ventricular tachycardia, unspecified: Secondary | ICD-10-CM

## 2024-03-07 NOTE — Telephone Encounter (Signed)
 Spoke to [place] patient's occupational therapist.She stated she went out to see patient this morning B/P before meds 84/47. Wife gave patient Toprol  25 mg 1/2 tablet.B/P 15 mins after drinking water 101/70.B/P before she left 95/59 pulse 66.She wanted to make Dr.Croitoru aware and get advice on B/P parameters.Message sent to Dr.Croitoru.

## 2024-03-07 NOTE — Telephone Encounter (Signed)
 Pt c/o BP issue: STAT if pt c/o blurred vision, one-sided weakness or slurred speech.  STAT if BP is GREATER than 180/120 TODAY.  STAT if BP is LESS than 90/60 and SYMPTOMATIC TODAY  1. What is your BP concern? Rosaline, OT called to report pt's bp today. He is not symptomatic   2. Have you taken any BP medication today?yes  3. What are your last 5 BP readings?  84/47 - prior to meds 101/70 - after drinking water and waiting 10-15 minutes  93/59 - currently while on the phone (30 minutes after meds)   4. Are you having any other symptoms (ex. Dizziness, headache, blurred vision, passed out)? No

## 2024-03-07 NOTE — Telephone Encounter (Signed)
 As long as he remains asymptomatic it is important to continue taking the metoprolol  in a low dose, even if BP is relatively low, to avoid VT and shocks from the ICD. Call us  if he has dizziness or syncope with low BP please.

## 2024-03-08 ENCOUNTER — Ambulatory Visit (HOSPITAL_COMMUNITY)
Admission: RE | Admit: 2024-03-08 | Discharge: 2024-03-08 | Disposition: A | Discharge status: Home / Self Care | Source: Ambulatory Visit | Attending: Cardiology | Admitting: Cardiology

## 2024-03-08 DIAGNOSIS — I2510 Atherosclerotic heart disease of native coronary artery without angina pectoris: Secondary | ICD-10-CM | POA: Insufficient documentation

## 2024-03-08 DIAGNOSIS — I472 Ventricular tachycardia, unspecified: Secondary | ICD-10-CM | POA: Diagnosis not present

## 2024-03-08 DIAGNOSIS — I5022 Chronic systolic (congestive) heart failure: Secondary | ICD-10-CM | POA: Insufficient documentation

## 2024-03-08 LAB — MYOCARDIAL PERFUSION IMAGING
LV dias vol: 250 mL (ref 62–150)
LV sys vol: 169 mL (ref 4.2–5.8)
Nuc Stress EF: 32 %
Peak HR: 88 {beats}/min
Rest HR: 60 {beats}/min
Rest Nuclear Isotope Dose: 10.7 mCi
SDS: -1
SRS: 36
SSS: 35
ST Depression (mm): 0 mm
Stress Nuclear Isotope Dose: 30.1 mCi
TID: 1.09

## 2024-03-08 MED ORDER — TECHNETIUM TC 99M TETROFOSMIN IV KIT
10.7000 | PACK | Freq: Once | INTRAVENOUS | Status: AC | PRN
  Administered 2024-03-08: 10.7 via INTRAVENOUS

## 2024-03-08 MED ORDER — REGADENOSON 0.4 MG/5ML IV SOLN
INTRAVENOUS | Status: AC
  Filled 2024-03-08: qty 5

## 2024-03-08 MED ORDER — TECHNETIUM TC 99M TETROFOSMIN IV KIT
30.1000 | PACK | Freq: Once | INTRAVENOUS | Status: AC | PRN
  Administered 2024-03-08: 30.1 via INTRAVENOUS

## 2024-03-08 MED ORDER — REGADENOSON 0.4 MG/5ML IV SOLN
0.4000 mg | Freq: Once | INTRAVENOUS | Status: AC
  Administered 2024-03-08: 0.4 mg via INTRAVENOUS

## 2024-03-08 NOTE — Telephone Encounter (Signed)
 Richard [name], OT and she said that [name] called her back yesterday and went over Dr Croitoru's reply/recommendation. She verbalized understanding of all information.

## 2024-03-09 ENCOUNTER — Ambulatory Visit: Payer: Self-pay | Admitting: Cardiovascular Disease

## 2024-03-12 ENCOUNTER — Encounter: Payer: Self-pay | Admitting: Cardiovascular Disease

## 2024-03-14 ENCOUNTER — Ambulatory Visit (INDEPENDENT_AMBULATORY_CARE_PROVIDER_SITE_OTHER): Admitting: Neurology

## 2024-03-14 ENCOUNTER — Encounter: Payer: Self-pay | Admitting: Neurology

## 2024-03-14 VITALS — BP 103/65 | HR 65 | Ht 70.0 in | Wt 163.0 lb

## 2024-03-14 DIAGNOSIS — I255 Ischemic cardiomyopathy: Secondary | ICD-10-CM

## 2024-03-14 DIAGNOSIS — G8111 Spastic hemiplegia affecting right dominant side: Secondary | ICD-10-CM

## 2024-03-14 DIAGNOSIS — I63411 Cerebral infarction due to embolism of right middle cerebral artery: Secondary | ICD-10-CM | POA: Diagnosis not present

## 2024-03-14 NOTE — Patient Instructions (Addendum)
 I had a long d/w patient and his wife about his recent embolic stroke, mechanical thrombectomy, residual spastic hemiplegia, cardiomyopathy, intracranial stenosis, risk for recurrent stroke/TIAs, personally independently reviewed imaging studies and stroke evaluation results and answered questions.Continue aspirin  81 mg daily  for intracranial stenosis and Eliquis  for cardiomyopathy for secondary stroke prevention and maintain strict control of hypertension with blood pressure goal below 130/90, diabetes with hemoglobin A1c goal below 6.5% and lipids with LDL cholesterol goal below 70 mg/dL. I also advised the patient to eat a healthy diet with plenty of whole grains, cereals, fruits and vegetables, exercise regularly and maintain ideal body weight .continue ongoing home physical and Occupational Therapy.  He was advised to walk with a walker at all times and we discussed fall safety precautions.  Followup in the future with my nurse practitioner in 6 to 8 months or call earlier if necessary.  Fall Prevention in the Home, Adult Falls can cause injuries and can happen to people of all ages. There are many things you can do to make your home safer and to help prevent falls. What actions can I take to prevent falls? General information Use good lighting in all rooms. Make sure to: Replace any light bulbs that burn out. Turn on the lights in dark areas and use night-lights. Keep items that you use often in easy-to-reach places. Lower the shelves around your home if needed. Move furniture so that there are clear paths around it. Do not use throw rugs or other things on the floor that can make you trip. If any of your floors are uneven, fix them. Add color or contrast paint or tape to clearly mark and help you see: Grab bars or handrails. First and last steps of staircases. Where the edge of each step is. If you use a ladder or stepladder: Make sure that it is fully opened. Do not climb a closed  ladder. Make sure the sides of the ladder are locked in place. Have someone hold the ladder while you use it. Know where your pets are as you move through your home. What can I do in the bathroom?     Keep the floor dry. Clean up any water on the floor right away. Remove soap buildup in the bathtub or shower. Buildup makes bathtubs and showers slippery. Use non-skid mats or decals on the floor of the bathtub or shower. Attach bath mats securely with double-sided, non-slip rug tape. If you need to sit down in the shower, use a non-slip stool. Install grab bars by the toilet and in the bathtub and shower. Do not use towel bars as grab bars. What can I do in the bedroom? Make sure that you have a light by your bed that is easy to reach. Do not use any sheets or blankets on your bed that hang to the floor. Have a firm chair or bench with side arms that you can use for support when you get dressed. What can I do in the kitchen? Clean up any spills right away. If you need to reach something above you, use a step stool with a grab bar. Keep electrical cords out of the way. Do not use floor polish or wax that makes floors slippery. What can I do with my stairs? Do not leave anything on the stairs. Make sure that you have a light switch at the top and the bottom of the stairs. Make sure that there are handrails on both sides of the stairs. Fix handrails  that are broken or loose. Install non-slip stair treads on all your stairs if they do not have carpet. Avoid having throw rugs at the top or bottom of the stairs. Choose a carpet that does not hide the edge of the steps on the stairs. Make sure that the carpet is firmly attached to the stairs. Fix carpet that is loose or worn. What can I do on the outside of my home? Use bright outdoor lighting. Fix the edges of walkways and driveways and fix any cracks. Clear paths of anything that can make you trip, such as tools or rocks. Add color or  contrast paint or tape to clearly mark and help you see anything that might make you trip as you walk through a door, such as a raised step or threshold. Trim any bushes or trees on paths to your home. Check to see if handrails are loose or broken and that both sides of all steps have handrails. Install guardrails along the edges of any raised decks and porches. Have leaves, snow, or ice cleared regularly. Use sand, salt, or ice melter on paths if you live where there is ice and snow during the winter. Clean up any spills in your garage right away. This includes grease or oil spills. What other actions can I take? Review your medicines with your doctor. Some medicines can cause dizziness or changes in blood pressure, which increase your risk of falling. Wear shoes that: Have a low heel. Do not wear high heels. Have rubber bottoms and are closed at the toe. Feel good on your feet and fit well. Use tools that help you move around if needed. These include: Canes. Walkers. Scooters. Crutches. Ask your doctor what else you can do to help prevent falls. This may include seeing a physical therapist to learn to do exercises to move better and get stronger. Where to find more information Centers for Disease Control and Prevention, [employer]: TonerPromos.no [employer] on Aging: BaseRingTones.pl National Institute on Aging: BaseRingTones.pl Contact a doctor if: You are afraid of falling at home. You feel weak, drowsy, or dizzy at home. You fall at home. Get help right away if you: Lose consciousness or have trouble moving after a fall. Have a fall that causes a head injury. These symptoms may be an emergency. Get help right away. Call 911. Do not wait to see if the symptoms will go away. Do not drive yourself to the hospital. This information is not intended to replace advice given to you by your health care provider. Make sure you discuss any questions you have with your health care provider. Document  Revised: 03/29/2022 Document Reviewed: 03/29/2022 Elsevier Patient Education  2024 [employer]  Stroke Prevention Some medical conditions and behaviors can lead to a higher chance of having a stroke. You can help prevent a stroke by eating healthy, exercising, not smoking, and managing any medical conditions you have. Stroke is a leading cause of functional impairment. Primary prevention is particularly important because a majority of strokes are first-time events. Stroke changes the lives of not only those who experience a stroke but also their family and other caregivers. How can this condition affect me? A stroke is a medical emergency and should be treated right away. A stroke can lead to brain damage and can sometimes be life-threatening. If a person gets medical treatment right away, there is a better chance of surviving and recovering from a stroke. What can increase my risk? The following medical conditions may increase  your risk of a stroke: Cardiovascular disease. High blood pressure (hypertension). Diabetes. High cholesterol. Sickle cell disease. Blood clotting disorders (hypercoagulable state). Obesity. Sleep disorders (obstructive sleep apnea). Other risk factors include: Being older than age 68. Having a history of blood clots, stroke, or mini-stroke (transient ischemic attack, TIA). Genetic factors, such as race, ethnicity, or a family history of stroke. Smoking cigarettes or using other tobacco products. Taking birth control pills, especially if you also use tobacco. Heavy use of alcohol or drugs, especially cocaine and methamphetamine. Physical inactivity. What actions can I take to prevent this? Manage your health conditions High cholesterol levels. Eating a healthy diet is important for preventing high cholesterol. If cholesterol cannot be managed through diet alone, you may need to take medicines. Take any prescribed medicines to control your cholesterol as told  by your health care provider. Hypertension. To reduce your risk of stroke, try to keep your blood pressure below 130/80. Eating a healthy diet and exercising regularly are important for controlling blood pressure. If these steps are not enough to manage your blood pressure, you may need to take medicines. Take any prescribed medicines to control hypertension as told by your health care provider. Ask your health care provider if you should monitor your blood pressure at home. Have your blood pressure checked every year, even if your blood pressure is normal. Blood pressure increases with age and some medical conditions. Diabetes. Eating a healthy diet and exercising regularly are important parts of managing your blood sugar (glucose). If your blood sugar cannot be managed through diet and exercise, you may need to take medicines. Take any prescribed medicines to control your diabetes as told by your health care provider. Get evaluated for obstructive sleep apnea. Talk to your health care provider about getting a sleep evaluation if you snore a lot or have excessive sleepiness. Make sure that any other medical conditions you have, such as atrial fibrillation or atherosclerosis, are managed. Nutrition Follow instructions from your health care provider about what to eat or drink to help manage your health condition. These instructions may include: Reducing your daily calorie intake. Limiting how much salt (sodium) you use to 1,500 milligrams (mg) each day. Using only healthy fats for cooking, such as olive oil, canola oil, or sunflower oil. Eating healthy foods. You can do this by: Choosing foods that are high in fiber, such as whole grains, and fresh fruits and vegetables. Eating at least 5 servings of fruits and vegetables a day. Try to fill one-half of your plate with fruits and vegetables at each meal. Choosing lean protein foods, such as lean cuts of meat, poultry without skin, fish, tofu,  beans, and nuts. Eating low-fat dairy products. Avoiding foods that are high in sodium. This can help lower blood pressure. Avoiding foods that have saturated fat, trans fat, and cholesterol. This can help prevent high cholesterol. Avoiding processed and prepared foods. Counting your daily carbohydrate intake.  Lifestyle If you drink alcohol: Limit how much you have to: 0-1 drink a day for women who are not pregnant. 0-2 drinks a day for men. Know how much alcohol is in your drink. In the [place] one drink equals one 12 oz bottle of beer ( ), one 5 oz glass of wine ( ), or one 1 oz glass of hard liquor (44mL). Do not use any products that contain nicotine or tobacco. These products include cigarettes, chewing tobacco, and vaping devices, such as e-cigarettes. If you need help quitting, ask your health care provider. Avoid  secondhand smoke. Do not use drugs. Activity  Try to stay at a healthy weight. Get at least 30 minutes of exercise on most days, such as: Fast walking. Biking. Swimming. Medicines Take over-the-counter and prescription medicines only as told by your health care provider. Aspirin  or blood thinners (antiplatelets or anticoagulants) may be recommended to reduce your risk of forming blood clots that can lead to stroke. Avoid taking birth control pills. Talk to your health care provider about the risks of taking birth control pills if: You are over 41 years old. You smoke. You get very bad headaches. You have had a blood clot. Where to find more information American Stroke Association: www.strokeassociation.org Get help right away if: You or a loved one has any symptoms of a stroke. BE FAST is an easy way to remember the main warning signs of a stroke: B - Balance. Signs are dizziness, sudden trouble walking, or loss of balance. E - Eyes. Signs are trouble seeing or a sudden change in vision. F - Face. Signs are sudden weakness or numbness of the face, or the  face or eyelid drooping on one side. A - Arms. Signs are weakness or numbness in an arm. This happens suddenly and usually on one side of the body. S - Speech. Signs are sudden trouble speaking, slurred speech, or trouble understanding what people say. T - Time. Time to call emergency services. Write down what time symptoms started. You or a loved one has other signs of a stroke, such as: A sudden, severe headache with no known cause. Nausea or vomiting. Seizure. These symptoms may represent a serious problem that is an emergency. Do not wait to see if the symptoms will go away. Get medical help right away. Call your local emergency services (911 in the [place] Do not drive yourself to the hospital. Summary You can help to prevent a stroke by eating healthy, exercising, not smoking, limiting alcohol intake, and managing any medical conditions you may have. Do not use any products that contain nicotine or tobacco. These include cigarettes, chewing tobacco, and vaping devices, such as e-cigarettes. If you need help quitting, ask your health care provider. Remember BE FAST for warning signs of a stroke. Get help right away if you or a loved one has any of these signs. This information is not intended to replace advice given to you by your health care provider. Make sure you discuss any questions you have with your health care provider. Document Revised: 06/28/2022 Document Reviewed: 06/28/2022 Elsevier Patient Education  2024 [employer].

## 2024-03-14 NOTE — Progress Notes (Addendum)
 [facility] [address] [place]. [place] [address] [phone]       OFFICE FOLLOW-UP NOTE  Richard [name] Date of Birth:  [birth year] Medical Record Number:  [id number]   HPI: Richard [name] is a 68 year old African-American male seen today for initial office follow-up visit coming hospital admission for stroke and April 2025.  He is accompanied by his wife.  History is obtained from them and review of electronic medical records and I personally reviewed pertinent available imaging films in PACS. He has past medical history of coronary artery disease, myocardial infarction, ischemic cardiomyopathy, congestive heart failure, ICD, V. tach, hyperlipidemia, HIV.  He was admitted on 12/01/2023 with sudden onset of dysarthria, left-sided weakness and left facial droop.  NIH stroke scale on admission was 11.  CT head showed hypoattenuation in the right caudate head and right lentiform nucleus raising possibility of acute infarct.  Aspect score was 8.  CT angiogram of the head and neck showed occlusion of the right M1 as well as proximal M2 branch of the right MCA with intraluminal thrombus.  There is chronic occlusion of the left ICA noted with reconstitution of the left supraclinoid ICA and ACA and MCA at the circle of Willis.  CT perfusion scan shows 0 core and 143 mL penumbra in the right MCA territory.  Patient underwent emergent mechanical thrombectomy with revascularization TICI2a.  Stroke etiology was felt to be acute on chronic MCA stenosis as well as embolization from cardiomyopathy with low EF.  Echocardiogram showed ejection fraction 2025% but no definite clot.  LDL cholesterol was 37 mg percent and hemoglobin A1c 5.6.  Patient was started on Eliquis  for his low EF and aspirin  81 was continued due to intracranial atherosclerosis which was also felt to be present.  Patient was discharged to inpatient rehab for 3 to 4 weeks and is currently at home.  He is getting home  physical and Occupational Therapy.  He is able to ambulate with one-person assist using a walker.  He has had 2 falls recently but fortunately no injuries.  He still has significant left-sided weakness but is able to lift his arm and leg off the bed though he is still quite weak.  He is tolerating aspirin  and Eliquis  with minor bruising but no bleeding.  He still has some dysarthria.  He has had no recurrent stroke or TIA symptoms.  He is tolerating Lipitor well without muscle aches and pains.  He states his blood pressure is under good control. ROS:   14 system review of systems is positive for bruising, pain, weakness, difficulty walking, imbalance all other systems negative  PMH:  Past Medical History:  Diagnosis Date   Coronary artery disease    a. cath 04/02/2014 3v dx 80-90% in D1, 80-90% mid LAD, 95% apical LAD, 95% distal inferior LV branch of LCx, total occlusion of prox RCA  b). Cath 04/03/2014 DES to mid LAD, DES x3 to prox D1, DAPT indefinitely. LCx lesion untreated   GERD (gastroesophageal reflux disease)    HIV (human immunodeficiency virus infection) (HCC)    Hypertension    Ischemic cardiomyopathy 08/09/2013   EF 20-25 percent by echo in 04/2014, s/p St. Jude [employer] [place] model [id number] serial number [id number]   Stroke [facility])     Social History:  Social History   Socioeconomic History   Marital status: Married    Spouse name: Not on file   Number of children: 1   Years of education: Not on file  Highest education level: Not on file  Occupational History   Occupation: disable Vet  Tobacco Use   Smoking status: Never   Smokeless tobacco: Never  Vaping Use   Vaping status: Never Used  Substance and Sexual Activity   Alcohol use: No   Drug use: No   Sexual activity: Not Currently  Other Topics Concern   Not on file  Social History Narrative   Not on file   Social Drivers of Health   Financial Resource Strain: Not on file  Food Insecurity: No Food Insecurity  (12/02/2023)   Hunger Vital Sign    Worried About Running Out of Food in the Last Year: Never true    Ran Out of Food in the Last Year: Never true  Transportation Needs: No Transportation Needs (12/02/2023)   PRAPARE - Administrator, Civil Service (Medical): No    Lack of Transportation (Non-Medical): No  Physical Activity: Not on file  Stress: Not on file  Social Connections: Unknown (12/02/2023)   Social Connection and Isolation Panel    Frequency of Communication with Friends and Family: Never    Frequency of Social Gatherings with Friends and Family: Never    Attends Religious Services: Not on Marketing executive or Organizations: Not on file    Attends Banker Meetings: Not on file    Marital Status: Not on file  Intimate Partner Violence: Not At Risk (12/02/2023)   Humiliation, Afraid, Rape, and Kick questionnaire    Fear of Current or Ex-Partner: No    Emotionally Abused: No    Physically Abused: No    Sexually Abused: No    Medications:   Current Outpatient Medications on File Prior to Visit  Medication Sig Dispense Refill   acetaminophen  (TYLENOL ) 325 MG tablet Take 2 tablets (650 mg total) by mouth every 6 (six) hours as needed for mild pain (pain score 1-3) (or temp > 37.5 C (99.5 F)).     apixaban  (ELIQUIS ) 5 MG TABS tablet Take 1 tablet (5 mg total) by mouth 2 (two) times daily. 60 tablet 0   aspirin  EC 81 MG tablet Take 1 tablet (81 mg total) by mouth daily. Swallow whole.     atorvastatin  (LIPITOR) 40 MG tablet Take 1 tablet (40 mg total) by mouth daily. 30 tablet 0   cetirizine (ZYRTEC) 10 MG tablet Take 10 mg by mouth daily.     diclofenac  Sodium (VOLTAREN ) 1 % GEL Apply 2 g topically 4 (four) times daily. 100 g 0   dolutegravir -lamiVUDine  (DOVATO ) 50-300 MG tablet Take 1 tablet by mouth daily.     ezetimibe  (ZETIA ) 10 MG tablet Take 10 mg by mouth daily.     fluticasone  (FLONASE ) 50 MCG/ACT nasal spray Place 1 spray into both  nostrils daily.     methocarbamol  (ROBAXIN ) 500 MG tablet Take 0.5 tablets (250 mg total) by mouth every 6 (six) hours as needed for muscle spasms. 30 tablet 0   metoprolol  succinate (TOPROL -XL) 25 MG 24 hr tablet Take 1 tablet (25mg ) by mouth if systolic blood pressure is greater than . Take 1/2 tablet (12.5mg ) by mouth with systolic blood pressure is less than . Take with or immediately following a meal. 90 tablet 3   pantoprazole  (PROTONIX ) 40 MG tablet Take 1 tablet (40 mg total) by mouth daily. 30 tablet 0   polyethylene glycol (MIRALAX  / GLYCOLAX ) 17 g packet Take 17 g by mouth daily as needed.  senna-docusate (SENOKOT-S) 8.6-50 MG tablet Take 2 tablets by mouth at bedtime. (Patient taking differently: Take 2 tablets by mouth as needed.)     sertraline (ZOLOFT) 50 MG tablet Take 25 mg by mouth daily. (Patient taking differently: Take 25 mg by mouth daily. 1/2 tablet daily)     No current facility-administered medications on file prior to visit.    Allergies:   Allergies  Allergen Reactions   Lisinopril  Cough   Ioxaglate Hives   Ivp Dye [Iodinated Contrast Media] Hives    Physical Exam General: Pleasant middle-aged African-American male seated, in no evident distress Head: head normocephalic and atraumatic.  Neck: supple with no carotid or supraclavicular bruits Cardiovascular: regular rate and rhythm, no murmurs Musculoskeletal: no deformity Skin:  no rash/petichiae Vascular:  Normal pulses all extremities Vitals:   03/14/24 1010  BP: 103/65  Pulse: 65   Neurologic Exam Mental Status: Awake and fully alert. Oriented to place and time. Recent and remote memory intact. Attention span, concentration and fund of knowledge appropriate. Mood and affect appropriate.  Mild dysarthria.  No aphasia.SABRAMMSE 27/30 FAQ 18 requiring modest help and most activities of daily living Cranial Nerves: Fundoscopic exam reveals sharp disc margins. Pupils equal, briskly reactive to  light. Extraocular movements full without nystagmus. Visual fields show slight decreased blink to threat on the left to to confrontation. Hearing intact. Facial sensation intact. Face, tongue, palate moves normally and symmetrically.  Motor: Spastic left hemiparesis with 2/5 strength in the left upper extremity proximally and 0/5 strength in the left grip and hand.  2-3/5 strength in the left lower extremity with left foot drop.  Tone is increased on the left with spasticity.  Normal strength on the right.   Sensory.: intact to touch ,pinprick .position and vibratory sensation but mild sensory intention on the left..  Coordination: Normal on the right and impaired on the left proportionate to the weakness  gait and Station: Deferred as patient is one-person assist at baseline even with a walker cannot bring it. Reflexes: 2+ and asymmetric and brisker on the left. Toes downgoing.   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  Patient underwent successful mechanical thrombectomy still has significant left hemiparesis.  Vascular risk factors of hypertension, hyperlipidemia, coronary artery disease, severe cardiomyopathy and intracranial stenosis.     PLAN:I had a long d/w patient and his wife about his recent embolic stroke, mechanical thrombectomy, residual spastic hemiplegia, cardiomyopathy, intracranial stenosis, risk for recurrent stroke/TIAs, personally  independently reviewed imaging studies and stroke evaluation results and answered questions.Continue aspirin  81 mg daily  for intracranial stenosis and Eliquis  for cardiomyopathy for secondary stroke prevention and maintain strict control of hypertension with blood pressure goal below 130/90, diabetes with hemoglobin A1c goal below 6.5% and lipids with LDL cholesterol goal below 70 mg/dL. I also advised the patient to eat a healthy diet with plenty of  whole grains, cereals, fruits and vegetables, exercise regularly and maintain ideal body weight .continue ongoing home physical and Occupational Therapy.  He was advised to walk with a walker at all times and we discussed fall safety precautions.  Followup in the future with my nurse practitioner in 6 to 8 months or call earlier if necessary.    I personally spent a total of 50 minutes in the care of the patient today including getting/reviewing separately obtained history, performing a medically appropriate exam/evaluation, counseling and educating, placing orders, referring and communicating with other health care professionals, documenting clinical information in the EHR, independently interpreting results, and coordinating care.       Eather Popp, MD  Note: This document was prepared with digital dictation and possible smart phrase technology. Any transcriptional errors that result from this process are unintentional

## 2024-03-23 ENCOUNTER — Encounter (HOSPITAL_COMMUNITY): Payer: Self-pay | Admitting: *Deleted

## 2024-03-23 ENCOUNTER — Emergency Department (HOSPITAL_COMMUNITY)

## 2024-03-23 ENCOUNTER — Inpatient Hospital Stay (HOSPITAL_COMMUNITY)
Admission: EM | Admit: 2024-03-23 | Discharge: 2024-03-24 | DRG: 315 | Disposition: A | Discharge status: Home / Self Care | Source: Ambulatory Visit | Attending: Cardiology | Admitting: Cardiology

## 2024-03-23 ENCOUNTER — Other Ambulatory Visit: Payer: Self-pay

## 2024-03-23 ENCOUNTER — Telehealth: Payer: Self-pay

## 2024-03-23 DIAGNOSIS — I420 Dilated cardiomyopathy: Secondary | ICD-10-CM | POA: Diagnosis present

## 2024-03-23 DIAGNOSIS — I255 Ischemic cardiomyopathy: Secondary | ICD-10-CM | POA: Diagnosis present

## 2024-03-23 DIAGNOSIS — Z8249 Family history of ischemic heart disease and other diseases of the circulatory system: Secondary | ICD-10-CM

## 2024-03-23 DIAGNOSIS — N1831 Chronic kidney disease, stage 3a: Secondary | ICD-10-CM | POA: Diagnosis present

## 2024-03-23 DIAGNOSIS — I2510 Atherosclerotic heart disease of native coronary artery without angina pectoris: Secondary | ICD-10-CM | POA: Diagnosis present

## 2024-03-23 DIAGNOSIS — Z21 Asymptomatic human immunodeficiency virus [HIV] infection status: Secondary | ICD-10-CM | POA: Diagnosis present

## 2024-03-23 DIAGNOSIS — I5022 Chronic systolic (congestive) heart failure: Secondary | ICD-10-CM | POA: Diagnosis present

## 2024-03-23 DIAGNOSIS — Z79899 Other long term (current) drug therapy: Secondary | ICD-10-CM

## 2024-03-23 DIAGNOSIS — I130 Hypertensive heart and chronic kidney disease with heart failure and stage 1 through stage 4 chronic kidney disease, or unspecified chronic kidney disease: Secondary | ICD-10-CM | POA: Diagnosis present

## 2024-03-23 DIAGNOSIS — Z8673 Personal history of transient ischemic attack (TIA), and cerebral infarction without residual deficits: Secondary | ICD-10-CM

## 2024-03-23 DIAGNOSIS — K219 Gastro-esophageal reflux disease without esophagitis: Secondary | ICD-10-CM | POA: Diagnosis present

## 2024-03-23 DIAGNOSIS — Z7982 Long term (current) use of aspirin: Secondary | ICD-10-CM | POA: Diagnosis not present

## 2024-03-23 DIAGNOSIS — Z95810 Presence of automatic (implantable) cardiac defibrillator: Secondary | ICD-10-CM

## 2024-03-23 DIAGNOSIS — I4901 Ventricular fibrillation: Secondary | ICD-10-CM | POA: Diagnosis not present

## 2024-03-23 DIAGNOSIS — Z7901 Long term (current) use of anticoagulants: Secondary | ICD-10-CM | POA: Diagnosis not present

## 2024-03-23 DIAGNOSIS — I252 Old myocardial infarction: Secondary | ICD-10-CM

## 2024-03-23 DIAGNOSIS — Z4502 Encounter for adjustment and management of automatic implantable cardiac defibrillator: Principal | ICD-10-CM

## 2024-03-23 DIAGNOSIS — I959 Hypotension, unspecified: Secondary | ICD-10-CM | POA: Diagnosis present

## 2024-03-23 DIAGNOSIS — I472 Ventricular tachycardia, unspecified: Secondary | ICD-10-CM | POA: Diagnosis present

## 2024-03-23 LAB — CBC WITH DIFFERENTIAL/PLATELET
Abs Immature Granulocytes: 0.01 K/uL (ref 0.00–0.07)
Basophils Absolute: 0 K/uL (ref 0.0–0.1)
Basophils Relative: 1 %
Eosinophils Absolute: 0.1 K/uL (ref 0.0–0.5)
Eosinophils Relative: 3 %
HCT: 37.9 % — ABNORMAL LOW (ref 39.0–52.0)
Hemoglobin: 12.3 g/dL — ABNORMAL LOW (ref 13.0–17.0)
Immature Granulocytes: 0 %
Lymphocytes Relative: 39 %
Lymphs Abs: 1.4 K/uL (ref 0.7–4.0)
MCH: 26.3 pg (ref 26.0–34.0)
MCHC: 32.5 g/dL (ref 30.0–36.0)
MCV: 81.2 fL (ref 80.0–100.0)
Monocytes Absolute: 0.4 K/uL (ref 0.1–1.0)
Monocytes Relative: 10 %
Neutro Abs: 1.7 K/uL (ref 1.7–7.7)
Neutrophils Relative %: 47 %
Platelets: 146 K/uL — ABNORMAL LOW (ref 150–400)
RBC: 4.67 MIL/uL (ref 4.22–5.81)
RDW: 14.8 % (ref 11.5–15.5)
WBC: 3.6 K/uL — ABNORMAL LOW (ref 4.0–10.5)
nRBC: 0 % (ref 0.0–0.2)

## 2024-03-23 LAB — MAGNESIUM: Magnesium: 1.8 mg/dL (ref 1.7–2.4)

## 2024-03-23 LAB — COMPREHENSIVE METABOLIC PANEL WITH GFR
ALT: 28 U/L (ref 0–44)
AST: 22 U/L (ref 15–41)
Albumin: 3.4 g/dL — ABNORMAL LOW (ref 3.5–5.0)
Alkaline Phosphatase: 61 U/L (ref 38–126)
Anion gap: 9 (ref 5–15)
BUN: 21 mg/dL (ref 8–23)
CO2: 22 mmol/L (ref 22–32)
Calcium: 9.1 mg/dL (ref 8.9–10.3)
Chloride: 109 mmol/L (ref 98–111)
Creatinine, Ser: 1.45 mg/dL — ABNORMAL HIGH (ref 0.61–1.24)
GFR, Estimated: 53 mL/min — ABNORMAL LOW (ref >60)
Glucose, Bld: 115 mg/dL — ABNORMAL HIGH (ref 70–99)
Potassium: 3.8 mmol/L (ref 3.5–5.1)
Sodium: 140 mmol/L (ref 135–145)
Total Bilirubin: 0.5 mg/dL (ref 0.0–1.2)
Total Protein: 7.2 g/dL (ref 6.5–8.1)

## 2024-03-23 LAB — TROPONIN I (HIGH SENSITIVITY)
Troponin I (High Sensitivity): 29 ng/L — ABNORMAL HIGH (ref <18)
Troponin I (High Sensitivity): 30 ng/L — ABNORMAL HIGH (ref <18)

## 2024-03-23 MED ORDER — AMIODARONE HCL IN DEXTROSE 360-4.14 MG/200ML-% IV SOLN
60.0000 mg/h | INTRAVENOUS | Status: AC
  Filled 2024-03-23: qty 200

## 2024-03-23 MED ORDER — AMIODARONE HCL IN DEXTROSE 360-4.14 MG/200ML-% IV SOLN
30.0000 mg/h | INTRAVENOUS | Status: DC
  Administered 2024-03-24: 30 mg/h via INTRAVENOUS
  Filled 2024-03-23: qty 200

## 2024-03-23 MED ORDER — ASPIRIN 81 MG PO TBEC
81.0000 mg | DELAYED_RELEASE_TABLET | Freq: Every day | ORAL | Status: DC
  Administered 2024-03-24: 81 mg via ORAL
  Filled 2024-03-23: qty 1

## 2024-03-23 MED ORDER — AMIODARONE LOAD VIA INFUSION
150.0000 mg | Freq: Once | INTRAVENOUS | Status: AC
  Administered 2024-03-24: 150 mg via INTRAVENOUS
  Filled 2024-03-23: qty 83.34

## 2024-03-23 MED ORDER — METHOCARBAMOL 500 MG PO TABS: 250.0000 mg | ORAL_TABLET | Freq: Four times a day (QID) | ORAL | Status: DC | PRN

## 2024-03-23 MED ORDER — NITROGLYCERIN 0.4 MG SL SUBL: 0.4000 mg | SUBLINGUAL_TABLET | SUBLINGUAL | Status: DC | PRN

## 2024-03-23 MED ORDER — ATORVASTATIN CALCIUM 40 MG PO TABS
40.0000 mg | ORAL_TABLET | Freq: Every day | ORAL | Status: DC
  Administered 2024-03-23: 40 mg via ORAL
  Filled 2024-03-23: qty 1

## 2024-03-23 MED ORDER — POTASSIUM CHLORIDE CRYS ER 20 MEQ PO TBCR
40.0000 meq | EXTENDED_RELEASE_TABLET | Freq: Once | ORAL | Status: AC
  Administered 2024-03-23: 40 meq via ORAL
  Filled 2024-03-23: qty 2

## 2024-03-23 MED ORDER — SERTRALINE HCL 50 MG PO TABS
25.0000 mg | ORAL_TABLET | Freq: Every day | ORAL | Status: DC
  Administered 2024-03-24: 25 mg via ORAL
  Filled 2024-03-23: qty 1

## 2024-03-23 MED ORDER — ACETAMINOPHEN 325 MG PO TABS: 650.0000 mg | ORAL_TABLET | ORAL | Status: DC | PRN

## 2024-03-23 MED ORDER — ONDANSETRON HCL 4 MG/2ML IJ SOLN: 4.0000 mg | Freq: Four times a day (QID) | INTRAMUSCULAR | Status: DC | PRN

## 2024-03-23 MED ORDER — APIXABAN 5 MG PO TABS
5.0000 mg | ORAL_TABLET | Freq: Two times a day (BID) | ORAL | Status: DC
  Administered 2024-03-23 – 2024-03-24 (×2): 5 mg via ORAL
  Filled 2024-03-23 (×2): qty 1

## 2024-03-23 MED ORDER — LORATADINE 10 MG PO TABS
10.0000 mg | ORAL_TABLET | Freq: Every day | ORAL | Status: DC
  Administered 2024-03-24: 10 mg via ORAL
  Filled 2024-03-23: qty 1

## 2024-03-23 MED ORDER — FLUTICASONE PROPIONATE 50 MCG/ACT NA SUSP
1.0000 | Freq: Every day | NASAL | Status: DC
  Administered 2024-03-24: 1 via NASAL
  Filled 2024-03-23: qty 16

## 2024-03-23 MED ORDER — DOLUTEGRAVIR-LAMIVUDINE 50-300 MG PO TABS
1.0000 | ORAL_TABLET | Freq: Every day | ORAL | Status: DC
  Administered 2024-03-24: 1 via ORAL
  Filled 2024-03-23: qty 1

## 2024-03-23 MED ORDER — PANTOPRAZOLE SODIUM 40 MG PO TBEC
40.0000 mg | DELAYED_RELEASE_TABLET | Freq: Every day | ORAL | Status: DC
  Administered 2024-03-24: 40 mg via ORAL
  Filled 2024-03-23: qty 1

## 2024-03-23 MED ORDER — METOPROLOL SUCCINATE ER 25 MG PO TB24
25.0000 mg | ORAL_TABLET | Freq: Every day | ORAL | Status: DC
  Administered 2024-03-24: 25 mg via ORAL
  Filled 2024-03-23: qty 1

## 2024-03-23 NOTE — ED Notes (Signed)
 Attempted to interrogate pacemaker with assistance from [place], [place] was unsuccessful. Unable to reach rep with number adhered to machine.

## 2024-03-23 NOTE — ED Provider Triage Note (Signed)
 Emergency Medicine Provider Triage Evaluation Note  Richard [name] , [name] 68 y.o. male  was evaluated in triage.  Pt complains of hypotension, ICD went off.  Patient reports that earlier this morning, he was laying down in bed when he felt that his ICD fired.  He denies any feelings of palpitations or weakness or lightheadedness at the time of the onset of the ICD discharge.  No reported chest pain shortness of breath the last several days.  Denies any nausea, vomiting, or diarrhea.  Review of Systems  Positive: As above Negative: As above  Physical Exam  BP 91/64 (BP Location: Right Arm)   Pulse 70   Temp 98.8 F (37.1 C)   Resp 16   SpO2 100%  Gen:   Awake, no distress   Resp:  Normal effort  MSK:   Moves extremities without difficulty  Other:  Alert and oriented and behaving appropriately.  Medical Decision Making  Medically screening exam initiated at 4:03 PM.  Appropriate orders placed.  Richard [name] was informed that the remainder of the evaluation will be completed by another provider, this initial triage assessment does not replace that evaluation, and the importance of remaining in the ED until their evaluation is complete.     Richard [name] A, PA-C 03/23/24 [phone]

## 2024-03-23 NOTE — H&P (Signed)
 CARDIOLOGY ADMISSION NOTE  Patient ID: Richard [name] MRN: [id number] DOB/AGE: 03-19-1956 68 y.o.  Admit date: 03/23/2024 Primary Physician   [name], MD Primary Cardiologist   Dr. Francyne Chief Complaint    ICD firing.   HPI: The patient has a history of ischemic cardiomyopathy.  He had a previous LAD infarct.  He has an ICD.  Most recently had appropriate therapy in 02/28/2024 without defibrillator firing.   He has had rapid ventricular arrhythmias complicated by the fact that he does not tolerate much in the way of medications and can only be on low-dose beta-blocker.  We did 1 more void and invasive coronary evaluation.  He had a perfusion study that demonstrated a large apical to basal anterolateral infarct but there was not thought to be reversible ischemia.  He was managed medically.His most recent ejection fraction was 20 to 25% in 11/2023.   He was told by our office to come to the ED with this event because it was the weekend and he has low BP.  They did not want him to wait over the weekend.    The patient had a previous CVA in April.  He seen Dr. Rosemarie.  CT angiogram demonstrated occlusion of the right M1 as well as proximal M2 branch of the right MCA with intraluminal thrombus.  There is chronic occlusion of the left ICA noted with reconstitution of the left supraclinoid ICA and ACA and MCA at the circle of Willis.  CT perfusion scan shows 0 core and 143 mL penumbra in the right MCA territory.  Patient underwent emergent mechanical thrombectomy with revascularization TICI2a.  Stroke etiology was felt to be acute on chronic MCA stenosis as well as embolization from cardiomyopathy with low EF.    He said he was in bed asleep when the device fired.  Interestingly he did not feel his most recent event prior to this.  The patient denies any new symptoms such as chest discomfort, neck or arm discomfort. There has been no new shortness of breath, PND or orthopnea. There have  been no reported palpitations, presyncope or syncope.  He is in a wheelchair and needs help with a walker since his stroke but he has been working with PT.    Past Medical History:  Diagnosis Date   Coronary artery disease    a. cath 04/02/2014 3v dx 80-90% in D1, 80-90% mid LAD, 95% apical LAD, 95% distal inferior LV branch of LCx, total occlusion of prox RCA  b). Cath 04/03/2014 DES to mid LAD, DES x3 to prox D1, DAPT indefinitely. LCx lesion untreated   GERD (gastroesophageal reflux disease)    HIV (human immunodeficiency virus infection) (HCC)    Hypertension    Ischemic cardiomyopathy 08/09/2013   EF 20-25 percent by echo in 04/2014, s/p St. Jude [place] model [id number] serial number [id number]   Stroke [facility])     Past Surgical History:  Procedure Laterality Date   CARDIAC CATHETERIZATION  04/02/2014   DR BURNARD   ESOPHAGOGASTRODUODENOSCOPY (EGD) WITH PROPOFOL  N/A 09/08/2018   Procedure: ESOPHAGOGASTRODUODENOSCOPY (EGD) WITH PROPOFOL ;  Surgeon: Rollin Dover, MD;  Location: [facility] ENDOSCOPY;  Service: Endoscopy;  Laterality: N/A;   IMPLANTABLE CARDIOVERTER DEFIBRILLATOR IMPLANT N/A 09/10/2014   Procedure: IMPLANTABLE CARDIOVERTER DEFIBRILLATOR IMPLANT;  Surgeon: Jerel Francyne, MD;  [facility] model [id number] serial number [id number]   IR CT HEAD LTD  12/01/2023   IR PERCUTANEOUS ART THROMBECTOMY/INFUSION INTRACRANIAL INC DIAG ANGIO  12/01/2023   IR  US  GUIDE VASC ACCESS RIGHT  12/01/2023   LEFT AND RIGHT HEART CATHETERIZATION WITH CORONARY ANGIOGRAM N/A 04/02/2014   Procedure: LEFT AND RIGHT HEART CATHETERIZATION WITH CORONARY ANGIOGRAM;  Surgeon: Debby DELENA Sor, MD;  Location: [facility] CATH LAB;  Service: Cardiovascular;  Laterality: N/A;   MEDIAL COLLATERAL LIGAMENT REPAIR, KNEE Right 08/25/2015   Procedure: RIGHT THUMB RADICAL COLLATERAL LIGAMENT REPAIR;  Surgeon: Donnice Robinsons, MD;  Location: [facility] OR;  Service: Orthopedics;  Laterality: Right;   PERCUTANEOUS CORONARY STENT  INTERVENTION (PCI-S) N/A 04/03/2014   Procedure: PERCUTANEOUS CORONARY STENT INTERVENTION (PCI-S);  Surgeon: Peter M Swaziland, MD;  Location: [facility] CATH LAB;  Service: Cardiovascular;  Laterality: N/A;   RADIOLOGY WITH ANESTHESIA N/A 12/01/2023   Procedure: RADIOLOGY WITH ANESTHESIA;  Surgeon: Radiologist, Medication, MD;  Location: [facility] OR;  Service: Radiology;  Laterality: N/A;  BETHANY BRADFORD DILATION N/A 09/08/2018   Procedure: SAVORY DILATION;  Surgeon: Rollin Dover, MD;  Location: [facility] ENDOSCOPY;  Service: Endoscopy;  Laterality: N/A;    Allergies  Allergen Reactions   Lisinopril  Cough   Ioxaglate Hives   Ivp Dye [Iodinated Contrast Media] Hives   No current facility-administered medications on file prior to encounter.   Current Outpatient Medications on File Prior to Encounter  Medication Sig Dispense Refill   acetaminophen  (TYLENOL ) 325 MG tablet Take 2 tablets (650 mg total) by mouth every 6 (six) hours as needed for mild pain (pain score 1-3) (or temp > 37.5 C (99.5 F)).     apixaban  (ELIQUIS ) 5 MG TABS tablet Take 1 tablet (5 mg total) by mouth 2 (two) times daily. 60 tablet 0   aspirin  EC 81 MG tablet Take 1 tablet (81 mg total) by mouth daily. Swallow whole.     atorvastatin  (LIPITOR) 40 MG tablet Take 1 tablet (40 mg total) by mouth daily. 30 tablet 0   cetirizine (ZYRTEC) 10 MG tablet Take 10 mg by mouth daily.     diclofenac  Sodium (VOLTAREN ) 1 % GEL Apply 2 g topically 4 (four) times daily. 100 g 0   dolutegravir -lamiVUDine  (DOVATO ) 50-300 MG tablet Take 1 tablet by mouth daily.     fluticasone  (FLONASE ) 50 MCG/ACT nasal spray Place 1 spray into both nostrils daily.     methocarbamol  (ROBAXIN ) 500 MG tablet Take 0.5 tablets (250 mg total) by mouth every 6 (six) hours as needed for muscle spasms. 30 tablet 0   metoprolol  succinate (TOPROL -XL) 25 MG 24 hr tablet Take 1 tablet (25mg ) by mouth if systolic blood pressure is greater than . Take 1/2 tablet (12.5mg ) by mouth with  systolic blood pressure is less than . Take with or immediately following a meal. 90 tablet 3   pantoprazole  (PROTONIX ) 40 MG tablet Take 1 tablet (40 mg total) by mouth daily. 30 tablet 0   polyethylene glycol (MIRALAX  / GLYCOLAX ) 17 g packet Take 17 g by mouth daily as needed.     senna-docusate (SENOKOT-S) 8.6-50 MG tablet Take 2 tablets by mouth at bedtime. (Patient taking differently: Take 2 tablets by mouth as needed.)     sertraline  (ZOLOFT ) 50 MG tablet Take 25 mg by mouth daily. (Patient taking differently: Take 25 mg by mouth daily. 1/2 tablet daily)     Social History   Socioeconomic History   Marital status: Married    Spouse name: Not on file   Number of children: 1   Years of education: Not on file   Highest education level: Not on file  Occupational History  Occupation: disable Vet  Tobacco Use   Smoking status: Never   Smokeless tobacco: Never  Vaping Use   Vaping status: Never Used  Substance and Sexual Activity   Alcohol use: No   Drug use: No   Sexual activity: Not Currently  Other Topics Concern   Not on file  Social History Narrative   He lives with his wife.  He was in the [employer] and gets his meds from the [place].  He has one child and two grands.    Social Drivers of Corporate investment banker Strain: Not on file  Food Insecurity: No Food Insecurity (12/02/2023)   Hunger Vital Sign    Worried About Running Out of Food in the Last Year: Never true    Ran Out of Food in the Last Year: Never true  Transportation Needs: No Transportation Needs (12/02/2023)   PRAPARE - Administrator, Civil Service (Medical): No    Lack of Transportation (Non-Medical): No  Physical Activity: Not on file  Stress: Not on file  Social Connections: Unknown (12/02/2023)   Social Connection and Isolation Panel    Frequency of Communication with Friends and Family: Never    Frequency of Social Gatherings with Friends and Family: Never    Attends Religious  Services: Not on Marketing executive or Organizations: Not on file    Attends Banker Meetings: Not on file    Marital Status: Not on file  Intimate Partner Violence: Not At Risk (12/02/2023)   Humiliation, Afraid, Rape, and Kick questionnaire    Fear of Current or Ex-Partner: No    Emotionally Abused: No    Physically Abused: No    Sexually Abused: No    Family History  Problem Relation Age of Onset   Heart disease Mother    Esophageal cancer Neg Hx    Colon cancer Neg Hx      ROS:  As stated in the HPI and negative for all other systems.  Physical Exam: Blood pressure (!) 144/76, pulse 92, temperature 98.8 F (37.1 C), resp. rate 20, height 5' 10 (1.778 m), weight 73.9 kg, SpO2 100%.  GENERAL:  Well appearing HEENT:  Pupils equal round and reactive, fundi not visualized, oral mucosa unremarkable NECK:  No jugular venous distention, waveform within normal limits, carotid upstroke brisk and symmetric, no bruits, no thyromegaly LYMPHATICS:  No cervical, inguinal adenopathy LUNGS:  Clear to auscultation bilaterally BACK:  No CVA tenderness CHEST:  Unremarkable HEART:  PMI not displaced or sustained,S1 and S2 within normal limits, no S3, no S4, no clicks, no rubs, no murmurs ABD:  Flat, positive bowel sounds normal in frequency in pitch, no bruits, no rebound, no guarding, no midline pulsatile mass, no hepatomegaly, no splenomegaly EXT:  2 plus pulses throughout, no edema, no cyanosis no clubbing SKIN:  No rashes no nodules NEURO:  Left hemiparesis.  PSYCH:  Cognitively intact, oriented to person place and time  Labs: Lab Results  Component Value Date   BUN 21 03/23/2024   Lab Results  Component Value Date   CREATININE 1.45 (H) 03/23/2024   Lab Results  Component Value Date   NA 140 03/23/2024   K 3.8 03/23/2024   CL 109 03/23/2024   CO2 22 03/23/2024   Lab Results  Component Value Date   TROPONINI <0.30 02/04/2014   Lab Results   Component Value Date   WBC 3.6 (L) 03/23/2024   HGB 12.3 (L)  03/23/2024   HCT 37.9 (L) 03/23/2024   MCV 81.2 03/23/2024   PLT 146 (L) 03/23/2024   Lab Results  Component Value Date   CHOL 82 12/02/2023   HDL 28 (L) 12/02/2023   LDLCALC 43 12/02/2023   TRIG 53 12/02/2023   CHOLHDL 2.9 12/02/2023   Lab Results  Component Value Date   ALT 28 03/23/2024   AST 22 03/23/2024   ALKPHOS 61 03/23/2024   BILITOT 0.5 03/23/2024      Radiology:  CXR: No acute airspace disease  EKG: Sinus rhythm, rate 70, premature ventricular contraction, low voltage, poor anterior R wave progression, no change from previous.  No acute ST-T wave changes.  ASSESSMENT AND PLAN:   ICD firing: He can get device interrogation in the morning.  Since he was told to come to the emergency room and it is reasonable to admit him and give him some IV amiodarone  probably switching over to oral tomorrow.  We can admit him for observation.  He had a recent perfusion study with infarct but no ischemia.  If he continues to have recurrent arrhythmias we could consider cardiac cath although Dr. Francyne wanted to defer this if possible.  I would replete his potassium to be above 4.  We can check a magnesium .  Try to continue low-dose beta-blocker as blood pressure allows.   Ischemic cardiomyopathy: His troponin was only minimally elevated.  He has not been having any recent symptoms.  I would likely defer invasive evaluation.  Recent CVA: Neurology notes reviewed from his last office visit.  Continue aspirin  and Eliquis .  Thought possibly to be embolic from his low EF.    SignedBETHA Lynwood Schilling 03/23/2024, 6:29 PM

## 2024-03-23 NOTE — Telephone Encounter (Signed)
 It's time to start amiodarone  in my opinion. Do not see an ED visit yet.  Recommend amiodarone  400 mg twice daily for a week, then once daily for a week, then 200 mg daily. When starting amiodarone , reduce the atorvastatin  to 40 mg daily. Refer in consultation to EP.

## 2024-03-23 NOTE — Telephone Encounter (Signed)
 Patient called to report he was laying in bed, attempted to turn on his side when he was shocked by ICD. Patient was asymptomatic before and after. Patient report compliance with Toprol -XL (see MAR for dosing based on BP parameters.)   BP checked at home was 88/56.  Remote transmission received. Normal device function. VF event recorded 03/23/24 @ 06:31 am w/ duration of 20 seconds. ATP X1 (unsuccessful) then 1 successful 30J shock.   Patient advised to go to ER for evaluation considering ATP/Shock and no room to increase BB d/t low BP and weekend coming up so we would not be able to get him in within the next day or two. Patient voiced understanding and agreeable to plan.   Patient education provided on shock plan/no driving x6 months per [place] DMV. Pts wife states patient does not drive.  Routing to Dr. Francyne for review.

## 2024-03-23 NOTE — ED Provider Notes (Signed)
 [place] EMERGENCY DEPARTMENT AT [facility] Provider Note   CSN: [id number] Arrival date & time: 03/23/24  1526     Patient presents with: icd fired   Richard [name] is a 68 y.o. male.   HPI 68 year old male with a history of ischemic cardiomyopathy, HIV, CAD, and an ICD presents after his ICD fired around 7:30 AM.  He was rolling over in bed and felt the defibrillation.  He denies feeling bad before or after.  This includes no chest pain, shortness of breath, palpitations.  No recent illness.  Has been taking his meds as prescribed though this morning his blood pressure was a little lower than normal with his SBP in the 80s.  Typically he is 90-100.  Otherwise he is asymptomatic.  Prior to Admission medications   Medication Sig Start Date End Date Taking? Authorizing Provider  acetaminophen  (TYLENOL ) 325 MG tablet Take 2 tablets (650 mg total) by mouth every 6 (six) hours as needed for mild pain (pain score 1-3) (or temp > 37.5 C (99.5 F)). 01/03/24   Angiulli, Toribio PARAS, PA-C  apixaban  (ELIQUIS ) 5 MG TABS tablet Take 1 tablet (5 mg total) by mouth 2 (two) times daily. 01/04/24   Angiulli, Toribio PARAS, PA-C  aspirin  EC 81 MG tablet Take 1 tablet (81 mg total) by mouth daily. Swallow whole. 01/04/24   Angiulli, Toribio PARAS, PA-C  atorvastatin  (LIPITOR) 40 MG tablet Take 1 tablet (40 mg total) by mouth daily. 01/04/24   Angiulli, Toribio PARAS, PA-C  cetirizine (ZYRTEC) 10 MG tablet Take 10 mg by mouth daily.    [provider]  diclofenac  Sodium (VOLTAREN ) 1 % GEL Apply 2 g topically 4 (four) times daily. 01/04/24   Angiulli, Toribio PARAS, PA-C  dolutegravir -lamiVUDine  (DOVATO ) 50-300 MG tablet Take 1 tablet by mouth daily. 09/15/20   [provider]  fluticasone  (FLONASE ) 50 MCG/ACT nasal spray Place 1 spray into both nostrils daily.    [provider]  methocarbamol  (ROBAXIN ) 500 MG tablet Take 0.5 tablets (250 mg total) by mouth every 6 (six) hours as needed for  muscle spasms. 01/04/24   Angiulli, Toribio PARAS, PA-C  metoprolol  succinate (TOPROL -XL) 25 MG 24 hr tablet Take 1 tablet (25mg ) by mouth if systolic blood pressure is greater than . Take 1/2 tablet (12.5mg ) by mouth with systolic blood pressure is less than . Take with or immediately following a meal. 02/28/24   Croitoru, Mihai, MD  pantoprazole  (PROTONIX ) 40 MG tablet Take 1 tablet (40 mg total) by mouth daily. 01/04/24   Angiulli, Daniel J, PA-C  polyethylene glycol (MIRALAX  / GLYCOLAX ) 17 g packet Take 17 g by mouth daily as needed. 01/03/24   Angiulli, Toribio PARAS, PA-C  senna-docusate (SENOKOT-S) 8.6-50 MG tablet Take 2 tablets by mouth at bedtime. Patient taking differently: Take 2 tablets by mouth as needed. 01/03/24   Angiulli, Toribio PARAS, PA-C  sertraline  (ZOLOFT ) 50 MG tablet Take 25 mg by mouth daily. Patient taking differently: Take 25 mg by mouth daily. 1/2 tablet daily 01/31/24   [provider]    Allergies: Lisinopril , Ioxaglate, and Ivp dye [iodinated contrast media]    Review of Systems  Respiratory:  Negative for shortness of breath.   Cardiovascular:  Negative for chest pain.  Gastrointestinal:  Negative for diarrhea and vomiting.    Updated Vital Signs BP (!) 144/76   Pulse 92   Temp 98.8 F (37.1 C)   Resp 20   Ht 5' 10 (1.778 m)  Wt 73.9 kg   SpO2 100%   BMI 23.38 kg/m   Physical Exam Vitals and nursing note reviewed.  Constitutional:      General: He is not in acute distress.    Appearance: He is well-developed. He is not ill-appearing or diaphoretic.  HENT:     Head: Normocephalic and atraumatic.  Cardiovascular:     Rate and Rhythm: Normal rate and regular rhythm.     Heart sounds: Normal heart sounds.  Pulmonary:     Effort: Pulmonary effort is normal.     Breath sounds: Normal breath sounds.  Abdominal:     Palpations: Abdomen is soft.     Tenderness: There is no abdominal tenderness.  Skin:    General: Skin is warm and dry.   Neurological:     Mental Status: He is alert.     (all labs ordered are listed, but only abnormal results are displayed) Labs Reviewed  CBC WITH DIFFERENTIAL/PLATELET - Abnormal; Notable for the following components:      Result Value   WBC 3.6 (*)    Hemoglobin 12.3 (*)    HCT 37.9 (*)    Platelets 146 (*)    All other components within normal limits  COMPREHENSIVE METABOLIC PANEL WITH GFR - Abnormal; Notable for the following components:   Glucose, Bld 115 (*)    Creatinine, Ser 1.45 (*)    Albumin 3.4 (*)    GFR, Estimated 53 (*)    All other components within normal limits  TROPONIN I (HIGH SENSITIVITY) - Abnormal; Notable for the following components:   Troponin I (High Sensitivity) 29 (*)    All other components within normal limits  MAGNESIUM  TROPONIN I (HIGH SENSITIVITY)    EKG: EKG Interpretation Date/Time:  Friday March 23 2024 15:35:44 EDT Ventricular Rate:  70 PR Interval:  180 QRS Duration:  104 QT Interval:  506 QTC Calculation: 546 R Axis:   10  Text Interpretation: Sinus rhythm with occasional Premature ventricular complexes Low voltage QRS Cannot rule out Anterior infarct , age undetermined Confirmed by Freddi Hamilton [phone]) on 03/23/2024 5:19:14 PM  Radiology: [facility] Chest 2 View Result Date: 03/23/2024 CLINICAL DATA:  Chest pain EXAM: CHEST - 2 VIEW COMPARISON:  04/03/2018 FINDINGS: Left-sided single lead pacing device. Borderline to mild cardiomegaly compared to prior. Probable coronary stent no acute airspace disease, pleural effusion or pneumothorax. IMPRESSION: Borderline to mild cardiomegaly. No acute airspace disease. Electronically Signed   By: Luke Bun M.D.   On: 03/23/2024 17:44     Procedures   Medications Ordered in the ED  potassium chloride  SA (KLOR-CON  M) CR tablet 40 mEq (has no administration in time range)                                    Medical Decision Making Amount and/or Complexity of Data Reviewed External Data  Reviewed: notes.    Details: Cardiology notes Labs: ordered.    Details: Mild leukopenia.  Normal potassium.  Mildly elevated troponin Radiology: ordered and independent interpretation performed.    Details: ICD in place. ECG/medicine tests: ordered and independent interpretation performed.    Details: No significant change from baseline  Risk Decision regarding hospitalization.   Patient presents with ICD discharge for ventricular fibrillation.  Patient is currently asymptomatic.  Troponin was sent in triage is mildly elevated but will need a repeat as I think this is not acutely  ischemic given no other symptoms.  Discussed with Dr. Lavona, and cardiology will admit.  He is otherwise currently stable.     Final diagnoses:  ICD (implantable cardioverter-defibrillator) discharge    ED Discharge Orders     None          Freddi Hamilton, MD 03/23/24 1900

## 2024-03-23 NOTE — ED Notes (Signed)
 Phlebotomy consulted regarding labs.

## 2024-03-23 NOTE — ED Triage Notes (Signed)
 The pt has a aicd and it went off this am  no chest pain  he is also had low bp

## 2024-03-24 DIAGNOSIS — Z4502 Encounter for adjustment and management of automatic implantable cardiac defibrillator: Principal | ICD-10-CM

## 2024-03-24 LAB — BASIC METABOLIC PANEL WITH GFR
Anion gap: 8 (ref 5–15)
BUN: 18 mg/dL (ref 8–23)
CO2: 22 mmol/L (ref 22–32)
Calcium: 9 mg/dL (ref 8.9–10.3)
Chloride: 109 mmol/L (ref 98–111)
Creatinine, Ser: 1.28 mg/dL — ABNORMAL HIGH (ref 0.61–1.24)
GFR, Estimated: >60 mL/min (ref >60)
Glucose, Bld: 95 mg/dL (ref 70–99)
Potassium: 3.8 mmol/L (ref 3.5–5.1)
Sodium: 139 mmol/L (ref 135–145)

## 2024-03-24 LAB — TSH: TSH: 2.177 u[IU]/mL (ref 0.350–4.500)

## 2024-03-24 MED ORDER — AMIODARONE HCL 200 MG PO TABS
200.0000 mg | ORAL_TABLET | Freq: Two times a day (BID) | ORAL | 1 refills | Status: DC
Start: 2024-03-24 — End: 2024-04-04

## 2024-03-24 MED ORDER — POTASSIUM CHLORIDE CRYS ER 10 MEQ PO TBCR: 10.0000 meq | EXTENDED_RELEASE_TABLET | Freq: Every day | ORAL | 3 refills | Status: DC

## 2024-03-24 MED ORDER — POTASSIUM CHLORIDE CRYS ER 10 MEQ PO TBCR
10.0000 meq | EXTENDED_RELEASE_TABLET | Freq: Every day | ORAL | Status: DC
Start: 2024-03-24 — End: 2024-03-24
  Administered 2024-03-24: 10 meq via ORAL
  Filled 2024-03-24: qty 1

## 2024-03-24 MED ORDER — AMBULATORY NON FORMULARY MEDICATION: Status: AC

## 2024-03-24 MED ORDER — AMIODARONE HCL 200 MG PO TABS
200.0000 mg | ORAL_TABLET | Freq: Two times a day (BID) | ORAL | Status: DC
  Administered 2024-03-24: 200 mg via ORAL
  Filled 2024-03-24: qty 1

## 2024-03-24 NOTE — Progress Notes (Signed)
 Patient and wife veronica both verbalized understanding of dc instructions. All belongings and paperwork given to patient.

## 2024-03-24 NOTE — Progress Notes (Signed)
 Progress Note  Patient Name: Richard [name] Date of Encounter: 03/24/2024  Primary Cardiologist: Debby Sor, MD (Inactive)   Subjective   Patient feels well and is eager to discharge.  Inpatient Medications    Scheduled Meds:  apixaban   5 mg Oral BID   aspirin  EC  81 mg Oral Daily   atorvastatin   40 mg Oral Daily   dolutegravir -lamiVUDine   1 tablet Oral Daily   fluticasone   1 spray Each Nare Daily   loratadine   10 mg Oral Daily   metoprolol  succinate  25 mg Oral Daily   pantoprazole   40 mg Oral Daily   sertraline   25 mg Oral Daily   Continuous Infusions:  amiodarone  30 mg/hr (03/24/24 0626)   PRN Meds: acetaminophen , methocarbamol , nitroGLYCERIN , ondansetron  (ZOFRAN ) IV   Vital Signs    Vitals:   03/24/24 0200 03/24/24 0215 03/24/24 0451 03/24/24 0844  BP: 94/70 98/68 96/72  93/61  Pulse: (!) 59  (!) 53 (!) 57  Resp: 15 20 17 15   Temp:   97.9 F (36.6 C) 97.9 F (36.6 C)  TempSrc:   Oral Oral  SpO2: 100%  100% 99%  Weight:   72.3 kg   Height:        Intake/Output Summary (Last 24 hours) at 03/24/2024 1047 Last data filed at 03/24/2024 0830 Gross per 24 hour  Intake 120 ml  Output --  Net 120 ml   Filed Weights   03/23/24 1618 03/23/24 2139 03/24/24 0451  Weight: 73.9 kg 73.8 kg 72.3 kg    Telemetry    Sinus rhythm, occasional NSVT - Personally Reviewed  ECG    Sinus rhythm with PVC - Personally Reviewed  Physical Exam   GEN: No acute distress.   Neck: No JVD Cardiac: RRR, no murmurs, rubs, or gallops.  Respiratory: Breathing easily Psych: Normal affect   Labs    Chemistry Recent Labs  Lab 03/23/24 1603 03/24/24 0239  NA 140 139  K 3.8 3.8  CL 109 109  CO2 22 22  GLUCOSE 115* 95  BUN 21 18  CREATININE 1.45* 1.28*  CALCIUM  9.1 9.0  PROT 7.2  --   ALBUMIN 3.4*  --   AST 22  --   ALT 28  --   ALKPHOS 61  --   BILITOT 0.5  --   GFRNONAA 53* >60  ANIONGAP 9 8     Hematology Recent Labs  Lab 03/23/24 1603  WBC 3.6*   RBC 4.67  HGB 12.3*  HCT 37.9*  MCV 81.2  MCH 26.3  MCHC 32.5  RDW 14.8  PLT 146*    Cardiac EnzymesNo results for input(s): TROPONINI in the last 168 hours. No results for input(s): TROPIPOC in the last 168 hours.   BNPNo results for input(s): BNP, PROBNP in the last 168 hours.   DDimer No results for input(s): DDIMER in the last 168 hours.   Summary of Pertinent studies    TTE: December 02, 2023: EF 20 to 25%.  Grade 1 diastolic dysfunction.  Mildly dilated left atrium.  Imaging: Nuclear stress myocardial perfusion study July 2025: Large, severe fixed basal to inferior and lateral perfusion defect.  LVEF 32%  Labs: Reviewed in Epic  Patient Profile     68 y.o. male with a history of LAD infarct with large fixed defect and ischemic cardiomyopathy.  He has an [employer] ICD with occasional nonsustained VT and had device therapy (ATP) for VT in July 2025.  Assessment & Plan  Ventricular tachycardia Frequent NSVT, recurrent device treated sustained VT over the past 6 months I reviewed the interrogation. The patient received an appropriate shock for VT that failed to convert with ATP. Does not well tolerate beta-blockers historically He has received IV amiodarone  overnight Can switch to 200 mg twice daily -- I am concerned that he would not tolerate the 400 mg dose  Ischemic cardiomyopathy Appears to be compensated and at baseline  History of stroke No changes  I am awaiting transmission of the interrogation from [employer]. I have ordered 200mg  of amiodarone  PO. If he tolerates it well, and the interrogation shows appropriate therapy for VT as expected, I think he will be able to discharge later today with amiodarone  200mg  PO BID.  For questions or updates, please contact [facility] Please consult [email or web address] for contact info under Cardiology/STEMI.      Signed, Eulas FORBES Furbish, MD 03/24/2024, 10:47 AM

## 2024-03-24 NOTE — Discharge Summary (Signed)
 Discharge Summary   Patient ID: Richard [name] MRN: [id number]; DOB: 12-21-1955  Admit date: 03/23/2024 Discharge date: 03/24/2024  PCP:  [name], MD   [facility] Providers Cardiologist:  Jerel Balding, MD       Discharge Diagnoses  Active Problems:   VT (ventricular tachycardia) (HCC)   Ischemic congestive cardiomyopathy (HCC)   CAD (coronary artery disease)   ICD (implantable cardioverter-defibrillator) discharge   Diagnostic Studies/Procedures  N/A _____________   History of Present Illness   Richard [name] is a 68 y.o. 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  The patient has complex history as noted above. He had previous CVA in 2003 as well as 11/2023, the latter of which was felt due to a/c MCA stenosis as well as embolization with low EF. He has been treated with ASA + Eliquis . Last echo was done at that time with EF 20-25%. Low blood pressure has limited GDMT for HF. He also has history of VT/VF and therapy from his device. Given this, he had a stress test 02/2024. Dr. Balding reported this showed old scar of large prior heart attack but no other areas in jeopardy and not requiring cardiac catheterization.   He was told to come to the ED this admission due to ICD discharge. He was started on IV amiodarone  and admitted to cardiology for further monitoring and evaluation.  Hospital Course    1. Ventricular tachycardia/ICD shock - per note, the patient has had frequent NSVT, recurrent device treated sustained VT over the past 6 months. Dr. Nancey, electrophysiologist, reviewed the interrogation which showed an appropriate shock for VT that failed to convert with ATP. - he received IV amiodarone  overnight and was transitioned to oral form 200mg   BID starting today, with plan to see EP as outpatient and remain on this dose until then (Dr. Nancey was concerned he may not tolerate the 400mg  dose) - Dr. Nancey told the patient to start OTC magnesium taurate, and was also started on low dose potassium supplementation which can be followed in outpatient setting  the patient does not well tolerate beta-blockers historically. Per our discussion, Dr. Nancey recommends to keep metoprolol  at home dose (keeping previous hold parameters regarding blood pressure) upon discharge - given VT, he was advised not to drive - but does not drive anyway - baseline TSH for amiodarone  pending at DC. I asked nurse to ensure this is in process before discharging, we will follow up when this results  2. Ischemic cardiomyopathy, CAD - reported to be to be compensated and at baseline. hsTroponin low/flat, not felt to be related to ACS.   3. History of stroke - no changes. Remains on ASA, Eliquis  at discharge  Dr. Nancey has seen and examined the patient today and feels he is stable for discharge. I spoke with Dr. Nancey regarding his follow-up. this is a patient that previously followed with Dr. Burnard and Dr. Balding. Earlier this year he had a recall to establish care with Dr. Kate upon Dr. Joesphine retirement, but we cancelled that as we feel his general cardiology should be followed by Dr. Balding going forward. He was seen by Dr. Nancey this admission who recommends EP follow-up as well. I sent a staff message to scheduling to help arrange  these appointments and call him with them.    Did the patient have an acute coronary syndrome (MI, NSTEMI, STEMI, etc) this admission?:  No                               Did the patient have a percutaneous coronary intervention (stent / angioplasty)?:  No.   _____________  Discharge Vitals Blood pressure 93/61, pulse (!) 57, temperature 97.9 F (36.6 C), temperature source Oral, resp. rate 15, height 5' 10 (1.778 m),  weight 72.3 kg, SpO2 99%.  Filed Weights   03/23/24 1618 03/23/24 2139 03/24/24 0451  Weight: 73.9 kg 73.8 kg 72.3 kg    Labs & Radiologic Studies  CBC Recent Labs    03/23/24 1603  WBC 3.6*  NEUTROABS 1.7  HGB 12.3*  HCT 37.9*  MCV 81.2  PLT 146*   Basic Metabolic Panel Recent Labs    91/84/74 1603 03/23/24 2020 03/24/24 0239  NA 140  --  139  K 3.8  --  3.8  CL 109  --  109  CO2 22  --  22  GLUCOSE 115*  --  95  BUN 21  --  18  CREATININE 1.45*  --  1.28*  CALCIUM  9.1  --  9.0  MG  --  1.8  --    Liver Function Tests Recent Labs    03/23/24 1603  AST 22  ALT 28  ALKPHOS 61  BILITOT 0.5  PROT 7.2  ALBUMIN 3.4*  High Sensitivity Troponin:   Recent Labs  Lab 03/23/24 1603 03/23/24 2020  TROPONINIHS 29* 30*     Lipoprotein (a)  Date/Time Value Ref Range Status  12/03/2023 07:03 AM 91.2 (H) <75.0 nmol/L Final    Comment:    (NOTE) Note:  Values greater than or equal to 75.0 nmol/L may       indicate an independent risk factor for CHD,       but must be evaluated with caution when applied       to non-Caucasian populations due to the       influence of genetic factors on Lp(a) across       ethnicities. Performed At: [facility] [address] [place], [place] [id number] Jennette Shorter MD Ey:[phone]      _____________  DG Chest 2 View Result Date: 03/23/2024 CLINICAL DATA:  Chest pain EXAM: CHEST - 2 VIEW COMPARISON:  04/03/2018 FINDINGS: Left-sided single lead pacing device. Borderline to mild cardiomegaly compared to prior. Probable coronary stent no acute airspace disease, pleural effusion or pneumothorax. IMPRESSION: Borderline to mild cardiomegaly. No acute airspace disease. Electronically Signed   By: Luke Bun M.D.   On: 03/23/2024 17:44   Disposition Pt is being discharged home today in good condition.  Follow-up Plans & Appointments  Follow-up Information     [facility] [employer]  Follow up.   Specialty: Cardiology Why: Davene Nicolas - cardiology office will call you to arrange 2 follow-up appointments - one with Dr. Francyne as well as the electrophysiology team. We cancelled your appointment with Dr. Kate since we feel Dr. Francyne can follow your general cardiology needs going forward. Contact information: [address] [place]  [address] [phone]               Discharge Instructions     Diet - low sodium heart healthy  Complete by: As directed    Discharge instructions   Complete by: As directed    Please see medication list for updated recommendations.  Miralax  and Senakot were removed from your medicine list since you indicated you no longer take these.  Regarding the magnesium taurate supplement, Dr. Nancey does not have a preference where you get this from, but recommends to follow the bottle instructions with a goal of getting about 400mg  of elemental magnesium.   Increase activity slowly   Complete by: As directed    No driving given your ventricular tachycardia/ICD shock.       Discharge Medications Allergies as of 03/24/2024       Reactions   Lisinopril  Cough   Shrimp (diagnostic) Other (See Comments)   unknown   Ioxaglate Hives   Ivp Dye [iodinated Contrast Media] Hives        Medication List     STOP taking these medications    polyethylene glycol 17 g packet Commonly known as: MIRALAX  / GLYCOLAX    senna-docusate 8.6-50 MG tablet Commonly known as: Senokot-S       TAKE these medications    acetaminophen  325 MG tablet Commonly known as: TYLENOL  Take 2 tablets (650 mg total) by mouth every 6 (six) hours as needed for mild pain (pain score 1-3) (or temp > 37.5 C (99.5 F)).   AMBULATORY NON FORMULARY MEDICATION Over-the-counter Magnesium Taurate supplement as discussed with Dr. Nancey   amiodarone  200 MG tablet Commonly known as: PACERONE  Take 1 tablet (200 mg total) by mouth 2 (two)  times daily.   aspirin  EC 81 MG tablet Take 1 tablet (81 mg total) by mouth daily. Swallow whole.   atorvastatin  40 MG tablet Commonly known as: LIPITOR Take 1 tablet (40 mg total) by mouth daily.   cetirizine 10 MG tablet Commonly known as: ZYRTEC Take 10 mg by mouth daily.   diclofenac  Sodium 1 % Gel Commonly known as: VOLTAREN  Apply 2 g topically 4 (four) times daily.   dolutegravir -lamiVUDine  50-300 MG tablet Commonly known as: DOVATO  Take 1 tablet by mouth daily.   Eliquis  5 MG Tabs tablet Generic drug: apixaban  Take 1 tablet (5 mg total) by mouth 2 (two) times daily.   fluticasone  50 MCG/ACT nasal spray Commonly known as: FLONASE  Place 1 spray into both nostrils daily.   methocarbamol  500 MG tablet Commonly known as: ROBAXIN  Take 0.5 tablets (250 mg total) by mouth every 6 (six) hours as needed for muscle spasms.   metoprolol  succinate 25 MG 24 hr tablet Commonly known as: TOPROL -XL Take 1 tablet (25mg ) by mouth if systolic blood pressure is greater than . Take 1/2 tablet (12.5mg ) by mouth with systolic blood pressure is less than . Take with or immediately following a meal.   pantoprazole  40 MG tablet Commonly known as: PROTONIX  Take 1 tablet (40 mg total) by mouth daily.   potassium chloride  10 MEQ tablet Commonly known as: KLOR-CON  M Take 1 tablet (10 mEq total) by mouth daily. Start taking on: March 25, 2024   sertraline  50 MG tablet Commonly known as: ZOLOFT  Take 25 mg by mouth daily. What changed: additional instructions         Outstanding Labs/Studies TSH ordered prior to DC Recommend consideration of BMET, Mg at follow-up  Duration of Discharge Encounter: APP Time: 15 minutes   Signed, Raphael LOISE Bring, PA-C 03/24/2024, 1:37 PM

## 2024-03-26 NOTE — Telephone Encounter (Signed)
 Patient was admitted and seen by Dr. Nancey in hospital.

## 2024-04-02 ENCOUNTER — Other Ambulatory Visit (HOSPITAL_COMMUNITY): Payer: Self-pay

## 2024-04-03 DIAGNOSIS — N1832 Chronic kidney disease, stage 3b: Secondary | ICD-10-CM | POA: Diagnosis not present

## 2024-04-03 DIAGNOSIS — I700 Atherosclerosis of aorta: Secondary | ICD-10-CM | POA: Diagnosis not present

## 2024-04-03 DIAGNOSIS — Z1211 Encounter for screening for malignant neoplasm of colon: Secondary | ICD-10-CM | POA: Diagnosis not present

## 2024-04-03 DIAGNOSIS — R634 Abnormal weight loss: Secondary | ICD-10-CM | POA: Diagnosis not present

## 2024-04-03 DIAGNOSIS — F329 Major depressive disorder, single episode, unspecified: Secondary | ICD-10-CM | POA: Diagnosis not present

## 2024-04-03 DIAGNOSIS — B20 Human immunodeficiency virus [HIV] disease: Secondary | ICD-10-CM | POA: Diagnosis not present

## 2024-04-03 DIAGNOSIS — Z125 Encounter for screening for malignant neoplasm of prostate: Secondary | ICD-10-CM | POA: Diagnosis not present

## 2024-04-03 DIAGNOSIS — I5022 Chronic systolic (congestive) heart failure: Secondary | ICD-10-CM | POA: Diagnosis not present

## 2024-04-03 DIAGNOSIS — Z7189 Other specified counseling: Secondary | ICD-10-CM | POA: Diagnosis not present

## 2024-04-03 DIAGNOSIS — H6123 Impacted cerumen, bilateral: Secondary | ICD-10-CM | POA: Diagnosis not present

## 2024-04-03 DIAGNOSIS — Z0001 Encounter for general adult medical examination with abnormal findings: Secondary | ICD-10-CM | POA: Diagnosis not present

## 2024-04-03 DIAGNOSIS — Z23 Encounter for immunization: Secondary | ICD-10-CM | POA: Diagnosis not present

## 2024-04-04 ENCOUNTER — Encounter: Payer: Self-pay | Admitting: Cardiovascular Disease

## 2024-04-04 ENCOUNTER — Ambulatory Visit: Payer: Medicare (Managed Care) | Attending: Cardiovascular Disease | Admitting: Cardiovascular Disease

## 2024-04-04 DIAGNOSIS — R8290 Unspecified abnormal findings in urine: Secondary | ICD-10-CM | POA: Diagnosis not present

## 2024-04-04 DIAGNOSIS — I472 Ventricular tachycardia, unspecified: Secondary | ICD-10-CM | POA: Diagnosis not present

## 2024-04-04 DIAGNOSIS — Z95810 Presence of automatic (implantable) cardiac defibrillator: Secondary | ICD-10-CM | POA: Diagnosis not present

## 2024-04-04 DIAGNOSIS — R7303 Prediabetes: Secondary | ICD-10-CM | POA: Diagnosis not present

## 2024-04-04 DIAGNOSIS — Z4502 Encounter for adjustment and management of automatic implantable cardiac defibrillator: Secondary | ICD-10-CM | POA: Diagnosis not present

## 2024-04-04 DIAGNOSIS — E785 Hyperlipidemia, unspecified: Secondary | ICD-10-CM | POA: Diagnosis not present

## 2024-04-04 DIAGNOSIS — N1832 Chronic kidney disease, stage 3b: Secondary | ICD-10-CM | POA: Diagnosis not present

## 2024-04-04 DIAGNOSIS — I5042 Chronic combined systolic (congestive) and diastolic (congestive) heart failure: Secondary | ICD-10-CM | POA: Diagnosis not present

## 2024-04-04 DIAGNOSIS — Z125 Encounter for screening for malignant neoplasm of prostate: Secondary | ICD-10-CM | POA: Diagnosis not present

## 2024-04-04 DIAGNOSIS — I255 Ischemic cardiomyopathy: Secondary | ICD-10-CM | POA: Diagnosis not present

## 2024-04-04 MED ORDER — AMIODARONE HCL 200 MG PO TABS: 200.0000 mg | ORAL_TABLET | Freq: Every day | ORAL | 1 refills | Status: DC

## 2024-04-04 MED ORDER — AMIODARONE HCL 200 MG PO TABS: 100.0000 mg | ORAL_TABLET | Freq: Two times a day (BID) | ORAL | Status: DC

## 2024-04-04 MED ORDER — AMIODARONE HCL 200 MG PO TABS: 100.0000 mg | ORAL_TABLET | Freq: Every day | ORAL | 1 refills | Status: DC

## 2024-04-04 NOTE — Progress Notes (Signed)
  Electrophysiology Office Note:    Date:  04/04/2024   ID:  Richard [name], DOB June 07, 1956, MRN [id number]  PCP:  [name], MD   [facility] Providers Cardiologist:  Jerel Balding, MD     Referring MD: [name], MD   History of Present Illness:    Richard [name] is a 68 y.o. male with a medical history significant for recurrent ventricular arrhythmia, Saint Jude ICD, ischemic cardiomyopathy, coronary disease with a large anterior infarction, who presents for EP follow-up.     Discussed the use of AI scribe software for clinical note transcription with the patient, who gave verbal consent to proceed.  History of Present Illness Richard [name] is a 68 year old male with severe ischemic cardiomyopathy who presents with recent episodes of ventricular tachycardia and fibrillation.  He has an extensive history of ischemic cardiomyopathy with an LAD artery distribution infarction, resulting in chronic systolic and diastolic heart failure. He has a St. Jude ICD for primary prevention and has received appropriate therapy for ventricular fibrillation in July 2025 and again in August 2025.  He has had two other appropriate interventions by his device for sustained ventricular tachycardia in 2018 and in November 05, 2021.  He is currently on amiodarone  twice daily and tolerates it well. He has a history of low blood pressure.         Today, he reports he feels well and has no acute complaints.  EKGs/Labs/Other Studies Reviewed Today:     Echocardiogram:  TTE December 02, 2023 LVEF 20 to 25%.  Mild concentric LVH.  Grade 1 diastolic dysfunction.  Mildly dilated left atrium.    Stress testing:  Nuclear stress March 08, 2024 Large, severe fixed basal to apical inferior inferolateral lateral and anterolateral perfusion defect suggestive of infarct.  No reversible ischemia.    EKG:         Physical Exam:    VS:  BP (!) 98/50 (BP Location:  Right Arm, Patient Position: Sitting, Cuff Size: Normal)   Pulse 61   Ht 5' 10 (1.778 m)   Wt 162 lb (73.5 kg)   SpO2 98%   BMI 23.24 kg/m     Wt Readings from Last 3 Encounters:  04/04/24 162 lb (73.5 kg)  03/24/24 159 lb 6.3 oz (72.3 kg)  03/14/24 163 lb (73.9 kg)     GEN: Chronically ill-appearing, well developed in no acute distress CARDIAC: RRR RESPIRATORY:  Normal work of breathing MUSCULOSKELETAL: no edema    ASSESSMENT & PLAN:     VT/VF S/p multiple rescues by device therapy Now on amiodarone  Will decrease amiodarone  dose to 200mg  daily Follow-up with APP in 3 mo for amiodarone  labs Abbott single-chamber ICD was interrogated today --normal device function.   Ischemic cardiomyopathy, CHFrEF Appears euvolemic today Not well-tolerating GDMT due to hypotension Continue metoprolol  XL 25 -- when tolerated  CAD No anginal symptoms Continue ASA 81, atorvastatin  40     Signed, Richard FORBES Furbish, MD  04/04/2024 12:48 PM    [facility]

## 2024-04-04 NOTE — Patient Instructions (Addendum)
 Medication Instructions:  Your physician has recommended you make the following change in your medication:   ** Decrease Amiodarone  200mg  to one tablet by mouth daily  *If you need a refill on your cardiac medications before your next appointment, please call your pharmacy*  Lab Work: None ordered.  If you have labs (blood work) drawn today and your tests are completely normal, you will receive your results only by: MyChart Message (if you have MyChart) OR A paper copy in the mail If you have any lab test that is abnormal or we need to change your treatment, we will call you to review the results.  Testing/Procedures: None ordered.   Follow-Up: At [facility], you and your health needs are our priority.  As part of our continuing mission to provide you with exceptional heart care, our providers are all part of one team.  This team includes your primary Cardiologist (physician) and Advanced Practice Providers or APPs (Physician Assistants and Nurse Practitioners) who all work together to provide you with the care you need, when you need it.  Your next appointment:   6 months with Dr Marko APP

## 2024-04-06 ENCOUNTER — Ambulatory Visit (INDEPENDENT_AMBULATORY_CARE_PROVIDER_SITE_OTHER): Payer: Medicare (Managed Care)

## 2024-04-06 DIAGNOSIS — I255 Ischemic cardiomyopathy: Secondary | ICD-10-CM | POA: Diagnosis not present

## 2024-04-07 LAB — CUP PACEART REMOTE DEVICE CHECK
Battery Remaining Longevity: 26 mo
Battery Remaining Percentage: 24 %
Battery Voltage: 2.8 V
Brady Statistic RV Percent Paced: 1 %
Date Time Interrogation Session: 20250829024943
HighPow Impedance: 61 Ohm
HighPow Impedance: 61 Ohm
Implantable Lead Connection Status: 753985
Implantable Lead Implant Date: 20160202
Implantable Lead Location: 753860
Implantable Pulse Generator Implant Date: 20160202
Lead Channel Impedance Value: 350 Ohm
Lead Channel Pacing Threshold Amplitude: 1 V
Lead Channel Pacing Threshold Pulse Width: 1 ms
Lead Channel Sensing Intrinsic Amplitude: 8.6 mV
Lead Channel Setting Pacing Amplitude: 2.5 V
Lead Channel Setting Pacing Pulse Width: 1 ms
Lead Channel Setting Sensing Sensitivity: 0.5 mV
Pulse Gen Serial Number: 7229147

## 2024-04-12 ENCOUNTER — Ambulatory Visit: Payer: Self-pay | Admitting: Cardiovascular Disease

## 2024-04-13 ENCOUNTER — Ambulatory Visit: Payer: Medicare Other

## 2024-04-16 ENCOUNTER — Encounter: Admitting: Student

## 2024-04-16 NOTE — Progress Notes (Signed)
Remote ICD Transmission.

## 2024-04-19 ENCOUNTER — Encounter: Payer: Self-pay | Admitting: Cardiovascular Disease

## 2024-04-19 ENCOUNTER — Ambulatory Visit: Payer: Medicare (Managed Care) | Attending: Cardiology | Admitting: Cardiovascular Disease

## 2024-04-19 VITALS — BP 92/64 | HR 55 | Ht 70.0 in | Wt 164.1 lb

## 2024-04-19 DIAGNOSIS — E785 Hyperlipidemia, unspecified: Secondary | ICD-10-CM

## 2024-04-19 DIAGNOSIS — B20 Human immunodeficiency virus [HIV] disease: Secondary | ICD-10-CM | POA: Diagnosis not present

## 2024-04-19 DIAGNOSIS — Z5181 Encounter for therapeutic drug level monitoring: Secondary | ICD-10-CM

## 2024-04-19 DIAGNOSIS — I472 Ventricular tachycardia, unspecified: Secondary | ICD-10-CM | POA: Diagnosis not present

## 2024-04-19 DIAGNOSIS — Z79899 Other long term (current) drug therapy: Secondary | ICD-10-CM

## 2024-04-19 DIAGNOSIS — Z95810 Presence of automatic (implantable) cardiac defibrillator: Secondary | ICD-10-CM

## 2024-04-19 DIAGNOSIS — I2510 Atherosclerotic heart disease of native coronary artery without angina pectoris: Secondary | ICD-10-CM | POA: Diagnosis not present

## 2024-04-19 DIAGNOSIS — I5042 Chronic combined systolic (congestive) and diastolic (congestive) heart failure: Secondary | ICD-10-CM | POA: Diagnosis not present

## 2024-04-19 NOTE — Progress Notes (Signed)
 Cardiology office note    Date:  04/19/2024   ID:  Richard [name], DOB 1956-07-08, MRN [id number]  PCP:  Richard Ezekiel NOVAK, MD  Cardiologist:  Richard Balding, MD ; Richard [name] (device) Electrophysiologist:  None   Evaluation Performed:  Follow-Up Visit  Chief Complaint: VFib  History of Present Illness:    Richard [name] is a 68 y.o. 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 Jude Fortify Assura VR (not MRI) defibrillator for primary prevention.  He is seen in the office today after receiving appropriate therapy for ventricular fibrillation from his ICD on 02/28/2024 at roughly 9 AM.  He remembers that he was sitting in his recliner watching a religious show.  He did not have dizziness, palpitations or lose consciousness.  He did not have any chest discomfort or shortness of breath.    In fact he has been doing well from a cardiovascular point of view recently without any shortness of breath with activity or chest pain (other than an episode lasting just a few seconds at rest, yesterday).  He denies dizziness or syncope.  He experienced an ischemic stroke in April (right M1 and M2 occlusion, thrombectomy by interventional radiology) and still has relatively dense left hemiparesis, more severe in the upper extremity.  He is also wearing an orthosis for the lower extremity.  After his stroke he was told not to take his metoprolol  if his systolic blood pressure in the morning is lower than 110 mmHg.  This is a common occurrence so he is actually been off his beta-blocker most of the time since April.  He is currently taking aspirin  81 mg daily as well as Eliquis  5 mg twice daily.  He has not had any bleeding problems.  He has not had any new neurological events.  Low blood pressure has  limited his guideline directed medical therapy for heart failure.  We have gradually reduced his dose of spironolactone  to the point that before his stroke he was only taking 12.5 mg 3 days a week.  He since then he has stopped it altogether.  He is not on any other RAAS inhibitors.  At 1 point he was prescribed Jardiance , but I am not sure that this was ever approved by the [facility] which provides most of his medicines  Interrogation of the device shows background normal rhythm with a PVC followed by a pause that seems to trigger polymorphic ventricular tachycardia/ventricular fibrillation with cycle length as short as 211 ms.  There is appropriate detection followed by ATP during charge.  There is a dirty break that occurs roughly 3 seconds after ATP, followed by return to sinus with frequent premature ventricular contractions.  High-voltage therapy was aborted.  Otherwise he has normal ICD function.  Estimated generator longevity is still 2.3 years.  His corvue thoracic impedance has been very stable over the last couple of weeks.  He never needs the device for pacing.  All lead parameters are normal and unchanged from previous visits.  His most recent assessment of left ventricular systolic function was at the time of his stroke when the EF was felt to be 20-25%.  Ever since he had his initial extensive anterolateral myocardial infarction his ejection fraction has been estimated to be anywhere between 20% and 35%, with some variation from study to study.  By nuclear scintigram in 2015 the  EF was 27%.  That is the last time he has had any coronary insufficiency evaluation.  No left ventricular thrombus was seen on the echocardiogram from April 2025.  It was felt that his stroke may have been related to acute on chronic middle cerebral artery stenosis versus cardioembolic from his severely reduced LV function.  There have not been any other changes in his health status.  He has excellent lipid parameters on  prescription of atorvastatin  40 mg daily (total cholesterol 82, HDL 28, LDL 43, triglycerides 53) his most recent hemoglobin A1c was normal at 5.6%.  He has moderate chronic kidney disease with a creatinine baseline around 1.4.  He has longstanding HIV infection that has been very well treated with an undetectable viral load for many years.  Following ICD implantation he has had 2 other appropriate interventions for the device for sustained ventricular tachycardia.  In 2018, following abrupt interruption in his beta-blocker, he had ventricular fibrillation terminated by single shock.  Most recently on November 05, 2021 (monomorphic VT with a cycle length of 255 ms, successfully interrupted with a single cycle of ATP only, no need for shock).  His most recent nuclear stress test in 2015 showed extensive scar without ischemia and the left ventricular ejection fraction of 27%.  He received an appropriate shock for ventricular fibrillation on September 30, 2016, after he been out of his metoprolol  for about a week.  He had ATP for sustained monomorphic VT in March 2023.  He is a disabled Investment banker, operational.  Past Medical History:  Diagnosis Date   Coronary artery disease    a. cath 04/02/2014 3v dx 80-90% in D1, 80-90% mid LAD, 95% apical LAD, 95% distal inferior LV branch of LCx, total occlusion of prox RCA  b). Cath 04/03/2014 DES to mid LAD, DES x3 to prox D1, DAPT indefinitely. LCx lesion untreated   GERD (gastroesophageal reflux disease)    HIV (human immunodeficiency virus infection) (HCC)    Hypertension    Ischemic cardiomyopathy 08/09/2013   EF 20-25 percent by echo in 04/2014, s/p St. Jude [place] model [id number] serial number [id number]   Stroke [facility])    Past Surgical History:  Procedure Laterality Date   CARDIAC CATHETERIZATION  04/02/2014   DR BURNARD   ESOPHAGOGASTRODUODENOSCOPY (EGD) WITH PROPOFOL  N/A 09/08/2018   Procedure: ESOPHAGOGASTRODUODENOSCOPY (EGD) WITH PROPOFOL ;  Surgeon: Richard Dover, MD;  Location: [facility] END[facility];  Service: Endoscopy;  Laterality: N/A;   IMPLANTABLE CARDIOVERTER DEFIBRILLATOR IMPLANT N/A 09/10/2014   Procedure: IMPLANTABLE CARDIOVERTER DEFIBRILLATOR IMPLANT;  Surgeon: Richard Balding, MD;  [facility] [place] model [id number] serial number [id number]   IR CT HEAD LTD  12/01/2023   IR PERCUTANEOUS ART THROMBECTOMY/INFUSION INTRACRANIAL INC DIAG ANGIO  12/01/2023   IR US  GUIDE VASC ACCESS RIGHT  12/01/2023   LEFT AND RIGHT HEART CATHETERIZATION WITH CORONARY ANGIOGRAM N/A 04/02/2014   Procedure: LEFT AND RIGHT HEART CATHETERIZATION WITH CORONARY ANGIOGRAM;  Surgeon: Debby DELENA BURNARD, MD;  Location: [facility] CATH LAB;  Service: Cardiovascular;  Laterality: N/A;   MEDIAL COLLATERAL LIGAMENT REPAIR, KNEE Right 08/25/2015   Procedure: RIGHT THUMB RADICAL COLLATERAL LIGAMENT REPAIR;  Surgeon: Donnice Robinsons, MD;  Location: [facility] OR;  Service: Orthopedics;  Laterality: Right;   PERCUTANEOUS CORONARY STENT INTERVENTION (PCI-S) N/A 04/03/2014   Procedure: PERCUTANEOUS CORONARY STENT INTERVENTION (PCI-S);  Surgeon: Peter M Swaziland, MD;  Location: [facility] CATH LAB;  Service: Cardiovascular;  Laterality: N/A;   RADIOLOGY WITH ANESTHESIA N/A 12/01/2023   Procedure: RADIOLOGY WITH ANESTHESIA;  Surgeon: Radiologist, Medication, MD;  Location: [facility] OR;  Service: Radiology;  Laterality: N/A;  BETHANY BRADFORD DILATION N/A 09/08/2018   Procedure: SAVORY DILATION;  Surgeon: Richard Dover, MD;  Location: [facility] ENDOSCOPY;  Service: Endoscopy;  Laterality: N/A;     Current Meds  Medication Sig   acetaminophen  (TYLENOL ) 325 MG tablet Take 2 tablets (650 mg total) by mouth every 6 (six) hours as needed for mild pain (pain score 1-3) (or temp > 37.5 C (99.5 F)).   AMBULATORY NON FORMULARY MEDICATION Over-the-counter Magnesium Taurate supplement as discussed with Dr. Mealor   amiodarone  (PACERONE ) 200 MG tablet Take 1 tablet (200 mg total) by mouth daily.   apixaban  (ELIQUIS ) 5 MG TABS tablet Take 1  tablet (5 mg total) by mouth 2 (two) times daily.   aspirin  EC 81 MG tablet Take 1 tablet (81 mg total) by mouth daily. Swallow whole.   atorvastatin  (LIPITOR) 40 MG tablet Take 1 tablet (40 mg total) by mouth daily.   cetirizine (ZYRTEC) 10 MG tablet Take 10 mg by mouth daily.   diclofenac  Sodium (VOLTAREN ) 1 % GEL Apply 2 g topically 4 (four) times daily.   dolutegravir -lamiVUDine  (DOVATO ) 50-300 MG tablet Take 1 tablet by mouth daily.   fluticasone  (FLONASE ) 50 MCG/ACT nasal spray Place 1 spray into both nostrils daily.   methocarbamol  (ROBAXIN ) 500 MG tablet Take 0.5 tablets (250 mg total) by mouth every 6 (six) hours as needed for muscle spasms.   metoprolol  succinate (TOPROL -XL) 25 MG 24 hr tablet Take 1 tablet (25mg ) by mouth if systolic blood pressure is greater than . Take 1/2 tablet (12.5mg ) by mouth with systolic blood pressure is less than . Take with or immediately following a meal. (Patient taking differently: Take 25 mg by mouth daily. Take 1 tablet (25mg ) by mouth if systolic blood pressure is greater than . Take 1/2 tablet (12.5mg ) by mouth with systolic blood pressure is less than . Take with or immediately following a meal.)   pantoprazole  (PROTONIX ) 40 MG tablet Take 1 tablet (40 mg total) by mouth daily.   potassium chloride  (KLOR-CON  M) 10 MEQ tablet Take 1 tablet (10 mEq total) by mouth daily.   potassium chloride  (KLOR-CON ) 10 MEQ tablet Take 10 mEq by mouth daily.   sertraline  (ZOLOFT ) 50 MG tablet Take 25 mg by mouth daily. (Patient taking differently: Take 25 mg by mouth daily. 1/2 tablet daily)     Allergies:   Lisinopril , Shrimp (diagnostic), Ioxaglate, and Ivp dye [iodinated contrast media]   Social History   Tobacco Use   Smoking status: Never   Smokeless tobacco: Never  Vaping Use   Vaping status: Never Used  Substance Use Topics   Alcohol use: No   Drug use: No     Family Hx: The patient's family history includes Heart disease  in his mother. There is no history of Esophageal cancer or Colon cancer.  ROS:   Please see the history of present illness.    All other systems are reviewed and are negative.   Prior CV studies:   The following studies were reviewed today: Comprehensive ICD device check in the office today, including active testing of capture threshold.  Echocardiogram 12/02/2023    1. Left ventricular ejection fraction, by estimation, is 20 to 25%. The  left ventricle has severely decreased function. The left ventricle  demonstrates global hypokinesis. The left ventricular internal cavity size  was mildly dilated. There is mild  concentric left ventricular hypertrophy. Left ventricular  diastolic  parameters are consistent with Grade I diastolic dysfunction (impaired  relaxation).   2. Right ventricular systolic function is moderately reduced. The right  ventricular size is mildly enlarged. The estimated right ventricular  systolic pressure is 16.5 mmHg.   3. Left atrial size was mildly dilated.   4. The mitral valve is normal in structure. Moderate mitral valve  regurgitation. No evidence of mitral stenosis.   5. The aortic valve is tricuspid. There is mild calcification of the  aortic valve. Aortic valve regurgitation is not visualized. No aortic  stenosis is present.   6. The inferior vena cava is normal in size with greater than 50%  respiratory variability, suggesting right atrial pressure of 3 mmHg.   7. Agitated saline contrast bubble study was negative, with no evidence  of any interatrial shunt.   Labs/Other Tests and Data Reviewed:    EKG:   EKG Interpretation Date/Time:    Ventricular Rate:    PR Interval:    QRS Duration:    QT Interval:    QTC Calculation:   R Axis:      Text Interpretation:           Recent Labs: 02/28/2024: NT-Pro BNP 622 03/23/2024: ALT 28; Hemoglobin 12.3; Magnesium 1.8; Platelets 146 03/24/2024: BUN 18; Creatinine, Ser 1.28; Potassium 3.8;  Sodium 139; TSH 2.177  BNP was 321  Recent Lipid Panel Lab Results  Component Value Date/Time   CHOL 82 12/02/2023 05:17 PM   CHOL 82 (L) 06/02/2021 08:34 AM   TRIG 53 12/02/2023 05:17 PM   HDL 28 (L) 12/02/2023 05:17 PM   HDL 33 (L) 06/02/2021 08:34 AM   CHOLHDL 2.9 12/02/2023 05:17 PM   LDLCALC 43 12/02/2023 05:17 PM   LDLCALC 37 06/02/2021 08:34 AM    Wt Readings from Last 3 Encounters:  04/19/24 164 lb 1.6 oz (74.4 kg)  04/04/24 162 lb (73.5 kg)  03/24/24 159 lb 6.3 oz (72.3 kg)     Objective:    Vital Signs:  BP 92/64 (BP Location: Right Arm, Patient Position: Sitting, Cuff Size: Normal)   Pulse (!) 55   Ht 5' 10 (1.778 m)   Wt 164 lb 1.6 oz (74.4 kg)   SpO2 98%   BMI 23.55 kg/m     General: Alert, oriented x3, no distress, healthy ICD site left subclavian area. Head: no evidence of trauma, PERRL, EOMI, no exophtalmos or lid lag, no myxedema, no xanthelasma; normal ears, nose and oropharynx Neck: normal jugular venous pulsations and no hepatojugular reflux; brisk carotid pulses without delay and no carotid bruits Chest: clear to auscultation, no signs of consolidation by percussion or palpation, normal fremitus, symmetrical and full respiratory excursions Cardiovascular: normal position and quality of the apical impulse, regular rhythm, normal first and second heart sounds, no murmurs, rubs or gallops Abdomen: no tenderness or distention, no masses by palpation, no abnormal pulsatility or arterial bruits, normal bowel sounds, no hepatosplenomegaly Extremities: no clubbing, cyanosis or edema; 2+ radial, ulnar and brachial pulses bilaterally; 2+ right femoral, posterior tibial and dorsalis pedis pulses; 2+ left femoral, posterior tibial and dorsalis pedis pulses; no subclavian or femoral bruits Neurological: grossly nonfocal Psych: Normal mood and affect   ASSESSMENT & PLAN:    1. Ventricular tachycardia (HCC)   2. Encounter for monitoring amiodarone  therapy   3.  Chronic combined systolic and diastolic CHF, NYHA class 2 (HCC)   4. Coronary artery disease involving native coronary artery of native heart without angina pectoris  5. ICD (implantable cardioverter-defibrillator) in place   6. Hyperlipidemia LDL goal <70   7. Human immunodeficiency virus (HIV) disease (HCC)       VT/VF: No obvious trigger.  No evidence of ischemia on nuclear perfusion imaging, no evidence of heart failure exacerbation, normal electrolytes.  No recurrence since starting amiodarone .   Amiodarone : Seems to be working to prevent further arrhythmia.  Now on the 200 mg dose.  Discussed the need to promptly report unexplained respiratory issues, the need to check liver function tests and thyroid  function tests every 6 months, be aware of multiple possible drug interactions, etc. CHF: Remains euvolemic and with good functional status without loop diuretics and on minimal medical therapy (limited by hypotension).  Try to continue at least the tiny dose of metoprolol  succinate that he is currently on due to the risk of ventricular arrhythmias.  Even though his systolic blood pressure is in the low 90s he is asymptomatic.   CAD: On aspirin , statin very low-dose beta-blocker.  Recent nuclear perfusion study was high risk due to the large scar and decreased LVEF, but there was no evidence of reversible ischemia. ICD: Normal ICD function on check today.  Appropriate intervention with antitachycardia during charge for ventricular fibrillation.  He has a follow-up appointment with the EP clinic in a couple of months and I will see him back in 6 months.  He knows that he should not drive for 6 months since his most recent defibrillator intervention. HLP: Excellent lipid profile with LDL cholesterol 43.  Labs are usually checked at the [facility]. HIV: On chronic treatment with Dovato .  Undetectable viral load for many years, monitored at the [facility].     Medication Adjustments/Labs and Tests  Ordered: Current medicines are reviewed at length with the patient today.  Concerns regarding medicines are outlined above.   Tests Ordered: No orders of the defined types were placed in this encounter.   Medication Changes: No orders of the defined types were placed in this encounter.   Disposition:  Follow up Remote downloads every 3 months and office visit in a year.  Signed, Richard Balding, MD  04/19/2024 8:52 AM    [facility]

## 2024-04-19 NOTE — Patient Instructions (Signed)
 Medication Instructions:  No changes *If you need a refill on your cardiac medications before your next appointment, please call your pharmacy*  Lab Work: None ordered If you have labs (blood work) drawn today and your tests are completely normal, you will receive your results only by: MyChart Message (if you have MyChart) OR A paper copy in the mail If you have any lab test that is abnormal or we need to change your treatment, we will call you to review the results.  Testing/Procedures: None ordered  Follow-Up: At [facility], you and your health needs are our priority.  As part of our continuing mission to provide you with exceptional heart care, our providers are all part of one team.  This team includes your primary Cardiologist (physician) and Advanced Practice Providers or APPs (Physician Assistants and Nurse Practitioners) who all work together to provide you with the care you need, when you need it.  Your next appointment:   6 month(s)  Provider:   Jerel Balding, MD    We recommend signing up for the patient portal called MyChart.  Sign up information is provided on this After Visit Summary.  MyChart is used to connect with patients for Virtual Visits (Telemedicine).  Patients are able to view lab/test results, encounter notes, upcoming appointments, etc.  Non-urgent messages can be sent to your provider as well.   To learn more about what you can do with MyChart, go to [email or web address].

## 2024-04-20 ENCOUNTER — Ambulatory Visit: Admitting: Cardiology

## 2024-04-26 DIAGNOSIS — Z23 Encounter for immunization: Secondary | ICD-10-CM | POA: Diagnosis not present

## 2024-05-17 ENCOUNTER — Other Ambulatory Visit: Payer: Self-pay | Admitting: General Surgery

## 2024-05-17 DIAGNOSIS — R1900 Intra-abdominal and pelvic swelling, mass and lump, unspecified site: Secondary | ICD-10-CM

## 2024-05-17 DIAGNOSIS — R1909 Other intra-abdominal and pelvic swelling, mass and lump: Secondary | ICD-10-CM | POA: Diagnosis not present

## 2024-05-21 ENCOUNTER — Other Ambulatory Visit: Payer: Self-pay | Admitting: General Surgery

## 2024-05-21 DIAGNOSIS — R1900 Intra-abdominal and pelvic swelling, mass and lump, unspecified site: Secondary | ICD-10-CM

## 2024-05-23 ENCOUNTER — Ambulatory Visit: Attending: Internal Medicine

## 2024-05-23 VITALS — BP 103/62 | HR 60

## 2024-05-23 DIAGNOSIS — M6281 Muscle weakness (generalized): Secondary | ICD-10-CM | POA: Diagnosis present

## 2024-05-23 DIAGNOSIS — R262 Difficulty in walking, not elsewhere classified: Secondary | ICD-10-CM | POA: Insufficient documentation

## 2024-05-23 DIAGNOSIS — R2689 Other abnormalities of gait and mobility: Secondary | ICD-10-CM | POA: Diagnosis present

## 2024-05-23 DIAGNOSIS — R293 Abnormal posture: Secondary | ICD-10-CM | POA: Diagnosis present

## 2024-05-23 NOTE — Therapy (Signed)
 OUTPATIENT PHYSICAL THERAPY NEURO EVALUATION   Patient Name: Richard [name] MRN: [id number] DOB:[birth year], 68 y.o., male Today's Date: 05/23/2024   PCP: Ezekiel Charleston, MD REFERRING PROVIDER: Wynne Flatten, MD  END OF SESSION:  PT End of Session - 05/23/24 0837     Visit Number 1    Number of Visits 15    Date for Recertification  07/13/24    Authorization Type [place]    Authorization Time Period 05/13/24-09/10/24    Authorization - Number of Visits 15    Progress Note Due on Visit 10    PT Start Time 0843    PT Stop Time 0927    PT Time Calculation (min) 44 min    Equipment Utilized During Treatment Gait belt;Other (comment)   HW, L AFO   Activity Tolerance Patient tolerated treatment well    Behavior During Therapy WFL for tasks assessed/performed          Past Medical History:  Diagnosis Date   Coronary artery disease    a. cath 04/02/2014 3v dx 80-90% in D1, 80-90% mid LAD, 95% apical LAD, 95% distal inferior LV branch of LCx, total occlusion of prox RCA  b). Cath 04/03/2014 DES to mid LAD, DES x3 to prox D1, DAPT indefinitely. LCx lesion untreated   GERD (gastroesophageal reflux disease)    HIV (human immunodeficiency virus infection) (HCC)    Hypertension    Ischemic cardiomyopathy 08/09/2013   EF 20-25 percent by echo in 04/2014, s/p [employer] [place] model [id number] serial number [id number]   Stroke [facility])    Past Surgical History:  Procedure Laterality Date   CARDIAC CATHETERIZATION  04/02/2014   DR BURNARD   ESOPHAGOGASTRODUODENOSCOPY (EGD) WITH PROPOFOL  N/A 09/08/2018   Procedure: ESOPHAGOGASTRODUODENOSCOPY (EGD) WITH PROPOFOL ;  Surgeon: Rollin Dover, MD;  Location: [facility] ENDOSCOPY;  Service: Endoscopy;  Laterality: N/A;   IMPLANTABLE CARDIOVERTER DEFIBRILLATOR IMPLANT N/A 09/10/2014   Procedure: IMPLANTABLE CARDIOVERTER DEFIBRILLATOR IMPLANT;  Surgeon: Jerel Balding, MD;  [facility] [place] model [id number] serial number [id number]   IR CT HEAD LTD  12/01/2023    IR PERCUTANEOUS ART THROMBECTOMY/INFUSION INTRACRANIAL INC DIAG ANGIO  12/01/2023   IR US  GUIDE VASC ACCESS RIGHT  12/01/2023   LEFT AND RIGHT HEART CATHETERIZATION WITH CORONARY ANGIOGRAM N/A 04/02/2014   Procedure: LEFT AND RIGHT HEART CATHETERIZATION WITH CORONARY ANGIOGRAM;  Surgeon: Debby DELENA BURNARD, MD;  Location: [facility] CATH LAB;  Service: Cardiovascular;  Laterality: N/A;   MEDIAL COLLATERAL LIGAMENT REPAIR, KNEE Right 08/25/2015   Procedure: RIGHT THUMB RADICAL COLLATERAL LIGAMENT REPAIR;  Surgeon: Donnice Robinsons, MD;  Location: [facility] OR;  Service: Orthopedics;  Laterality: Right;   PERCUTANEOUS CORONARY STENT INTERVENTION (PCI-S) N/A 04/03/2014   Procedure: PERCUTANEOUS CORONARY STENT INTERVENTION (PCI-S);  Surgeon: Peter M Swaziland, MD;  Location: [facility] CATH LAB;  Service: Cardiovascular;  Laterality: N/A;   RADIOLOGY WITH ANESTHESIA N/A 12/01/2023   Procedure: RADIOLOGY WITH ANESTHESIA;  Surgeon: Radiologist, Medication, MD;  Location: [facility] OR;  Service: Radiology;  Laterality: N/A;  BETHANY BRADFORD DILATION N/A 09/08/2018   Procedure: SAVORY DILATION;  Surgeon: Rollin Dover, MD;  Location: [facility] ENDOSCOPY;  Service: Endoscopy;  Laterality: N/A;   Patient Active Problem List   Diagnosis Date Noted   ICD (implantable cardioverter-defibrillator) discharge 03/23/2024   Hyperlipidemia 01/02/2024   CKD stage 3a, GFR 45-59 ml/min (HCC) 01/02/2024   Depression with anxiety 12/27/2023   Chronic systolic congestive heart failure (HCC) 12/23/2023   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  Acute ischemic right MCA stroke (HCC) 12/01/2023   Schizophrenia, unspecified (HCC) 03/07/2023   Recurrent major depression 03/07/2023   Pseudophakia of left eye 03/07/2023   Acid reflux 03/07/2023   Dyspepsia 03/07/2023   Anxiety state 03/07/2023   Cortical age-related cataract of right eye 03/07/2023   Psychotic disorder (HCC) 03/07/2023   CAD (coronary artery disease) 03/07/2023   Abdominal mass  07/12/2021   Peripheral vascular disease 02/04/2021   Overweight 01/21/2020   Dysphagia 12/04/2018   Hiatal hernia 04/19/2018   Gastroesophageal reflux disease 04/03/2018   Ischemic congestive cardiomyopathy (HCC) 10/17/2017   Generalized anxiety disorder 10/17/2017   Prediabetes 10/16/2017   Acquired thrombocytopenia 10/16/2017   Chronic kidney disease, stage 3 (HCC) 10/16/2017   Primary erectile dysfunction 10/13/2017   Chronic combined systolic and diastolic heart failure (HCC) 12/30/2015   Traumatic rupture of collateral ligament of other finger at metacarpophalangeal and interphalangeal joint, subsequent encounter 10/30/2015   Traumatic rupture of collateral ligament of other finger at metacarpophalangeal and interphalangeal joint, initial encounter 08/12/2015   VT (ventricular tachycardia) (HCC) 12/31/2014   ICD (implantable cardioverter-defibrillator) in place 09/10/2014   At risk for sudden cardiac death 09/09/2014   Systolic heart failure (HCC) 08/09/2014   Coronary artery disease involving native coronary artery of native heart without angina pectoris 04/04/2014   Unstable angina (HCC) 04/03/2014   Cardiomyopathy, ischemic 03/22/2014   History of CVA (cerebrovascular accident) 02/20/2014   Human immunodeficiency virus infection (HCC) 02/20/2014   Essential hypertension 02/20/2014   Hyperlipidemia LDL goal <70 02/20/2014   Depression 02/20/2014   Cerebral infarction (HCC) 08/09/2006    ONSET DATE: 05/15/24 referral    REFERRING DIAG: I63.9 (ICD-10-CM) - Cerebral infarction, unspecified   THERAPY DIAG:  Abnormal posture - Plan: PT plan of care cert/re-cert  Difficulty in walking, not elsewhere classified - Plan: PT plan of care cert/re-cert  Muscle weakness (generalized) - Plan: PT plan of care cert/re-cert  Other abnormalities of gait and mobility - Plan: PT plan of care cert/re-cert  Rationale for Evaluation and Treatment: Rehabilitation  SUBJECTIVE:                                                                                                                                                                                              SUBJECTIVE STATEMENT: Patient arrives to clinic in standard wheelchair with wife. He had a [facility] CVA in April. Went to CIR and then received [facility] therapy. Walks around his house with [facility] and supervision. Does have a L AFO, which he received in CIR. Only wears it when leaving the house- otherwise walks with no shoes and no  brace in the house.  Pt accompanied by: significant other  PERTINENT HISTORY: CAD, GERD, HIV, HTN, ischemic cardiomyopathy, CVA  PAIN:  Are you having pain? No  PRECAUTIONS: Fall and ICD/Pacemaker; L hemi   WEIGHT BEARING RESTRICTIONS: No  FALLS: Has patient fallen in last 6 months? Yes. Number of falls 4; slid off sofa, fell off bed, nearly fell off commode into tub   LIVING ENVIRONMENT: Lives with: lives with their spouse Lives in: House/apartment Stairs: Yes: External: 1 steps; on right going up Has following equipment at home: Hemi walker, Wheelchair (manual), Shower bench, bed side commode, and Grab bars  PLOF: Independent  PATIENT GOALS: to walk and use my hand   OBJECTIVE:  Note: Objective measures were completed at Evaluation unless otherwise noted.  DIAGNOSTIC FINDINGS: 12/01/23 head CT IMPRESSION: 1. Subtle hypoattenuation involving the right caudate and right lentiform nuclei which may reflect acute infarct. 2. No acute intracranial hemorrhage. 3. Remote infarct involving the right frontal operculum, insula, and superior right temporal lobe. 4. Asymmetric density of the right M1 segment. Recommend correlation with CTA. 5. ASPECTS is 8  COGNITION: Overall cognitive status: Within functional limits for tasks assessed   SENSATION: WFL   POSTURE: rounded shoulders, forward head, increased thoracic kyphosis, and posterior pelvic tilt  LOWER EXTREMITY MMT:    MMT  Right Eval Left Eval  Hip flexion  2-  Hip extension    Hip abduction  2-  Hip adduction  2-  Hip internal rotation    Hip external rotation    Knee flexion  3  Knee extension  3+  Ankle dorsiflexion  1  Ankle plantarflexion  0  Ankle inversion  0  Ankle eversion  0  (Blank rows = not tested) MAS: -L quad: 2+ -L hamstring: 1+   BED MOBILITY:  ModI using bed rail  TRANSFERS: Sit to stand: CGA  Assistive device utilized: Hemi walker     Stand to sit: CGA  Assistive device utilized: Hemi walker      GAIT: Findings: Gait Characteristics: step to pattern, decreased arm swing- Left, decreased step length- Right, decreased stance time- Left, decreased stride length, decreased hip/knee flexion- Left, decreased ankle dorsiflexion- Left, circumduction- Left, Left hip hike, trunk rotated posterior- Left, trunk flexed, narrow BOS, and poor foot clearance- Left, Assistive device utilized:Hemi walker, and Level of assistance: CGA and Min A  FUNCTIONAL TESTS:   [facility] PT Assessment - 05/23/24 0001       Standardized Balance Assessment   Standardized Balance Assessment 10 meter walk test    Five times sit to stand comments  30.81 CGA + R UE    10 Meter Walk .42m/s HW + CGA      Timed Up and Go Test   Normal TUG (seconds) 98   HW + MinA                                                                                                                   TREATMENT  Self care/home  management:  -use HW for the time being, continue previous HEP from [facility]   PATIENT EDUCATION: Education details: PT POC, exam findings, see above Person educated: Patient and Spouse Education method: Explanation Education comprehension: verbalized understanding and needs further education  HOME EXERCISE PROGRAM: To be provided  GOALS: Goals reviewed with patient? Yes  SHORT TERM GOALS: Target date: 06/22/24  Pt will be independent with initial HEP for improved functional strength and balance  Baseline:  to be provided  Goal status: INITIAL  2.  Pt will improve gait speed to >/= .65m/s to demonstrate improved community ambulation  Baseline: .80m/s with HW + CGA Goal status: INITIAL  3.  Pt will improve TUG to </= 70 secs to demonstrated reduced fall risk  Baseline: 98s with HW + MinA Goal status: INITIAL  4.  Pt will improve 5x STS to </= 22 sec to demo improved functional LE strength and balance   Baseline: 30.81 with CGA + R UE Goal status: INITIAL   LONG TERM GOALS: Target date: 07/13/24 (to match cert date)   Pt will be independent with final HEP for improved functional strength and balance  Baseline: to be provided  Goal status: INITIAL  Pt will improve gait speed to >/= .52m/s to demonstrate improved community ambulation  Baseline: .71m/s with HW + CGA Goal status: INITIAL  Pt will improve TUG to </= 50 secs to demonstrated reduced fall risk  Baseline: 98s with HW + MinA Goal status: INITIAL  Pt will improve 5x STS to </= 15 sec to demo improved functional LE strength and balance   Baseline: 30.81 with CGA + R UE Goal status: INITIAL  ASSESSMENT:  CLINICAL IMPRESSION: Patient is a 68 y.o. male who was seen today for physical therapy evaluation and treatment for impaired mobility s/p CVA. He presents with spastic L hemiplegia. Increased spasticity noted in L quads> hamstrings with potential extensor tone noted as well. He has an AFO, but does not consistently wear it at home. No evidence of inappropriate L ankle inversion, but due to poor gastroc and tib anterior strength, he benefits from use of it. During gait, patient will crouch onto L LE to almost elicit his extensor tone in order for their to be safe weight acceptance after swing through to stance. Five times Sit to Stand Test (FTSS) Method: Use a straight back chair with a solid seat that is 17-18" high. Ask participant to sit on the chair with arms folded across their chest.   Instructions: "Stand up and sit  down as quickly as possible 5 times, keeping your arms folded across your chest."   Measurement: Stop timing when the participant touches the chair in sitting the 5th time.  TIME: 30.81 sec  Cut off scores indicative of increased fall risk: >12 sec CVA, >16 sec PD, >13 sec vestibular (ANPTA Core Set of Outcome Measures for Adults with Neurologic Conditions, 2018). 10 Meter Walk Test: Patient instructed to walk 10 meters (32.8 ft) as quickly and as safely as possible at their normal speed x2 and at a fast speed x2. Time measured from 2 meter mark to 8 meter mark to accommodate ramp-up and ramp-down.  Normal speed: .65m/s Cut off scores: <0.4 m/s = household Ambulator, 0.4-0.8 m/s = limited community Ambulator, >0.8 m/s = community Ambulator, >1.2 m/s = crossing a street, <1.0 = increased fall risk MCID 0.05 m/s (small), 0.13 m/s (moderate), 0.06 m/s (significant)  (ANPTA Core Set of Outcome Measures for Adults with Neurologic Conditions, 2018).  Patient completed the Timed Up and Go test (TUG) in 98 seconds.  Geriatrics: need for further assessment of fall risk: >= 12 sec; Recurrent falls: > 15 sec; Vestibular Disorders fall risk: > 15 sec; Parkinson's Disease fall risk: > 16 sec ([email or web address], 2023). He would benefit from skilled PT services to address the above mentioned deficits.    OBJECTIVE IMPAIRMENTS: Abnormal gait, decreased balance, decreased coordination, decreased endurance, decreased knowledge of condition, decreased knowledge of use of DME, decreased ROM, decreased strength, impaired tone, impaired UE functional use, and postural dysfunction.   ACTIVITY LIMITATIONS: carrying, lifting, bending, standing, squatting, stairs, transfers, bed mobility, locomotion level, and caring for others  PARTICIPATION LIMITATIONS: meal prep, cleaning, interpersonal relationship, driving, shopping, community activity, occupation, and yard work  PERSONAL FACTORS: Age, Fitness, Past/current experiences,  Time since onset of injury/illness/exacerbation, Transportation, and 3+ comorbidities: see above are also affecting patient's functional outcome.   REHAB POTENTIAL: Fair time since onset  CLINICAL DECISION MAKING: Stable/uncomplicated  EVALUATION COMPLEXITY: Low  PLAN:  PT FREQUENCY: 2x/week  PT DURATION: other: 7 weeks  PLANNED INTERVENTIONS: [id number]- PT Re-evaluation, 97750- Physical Performance Testing, 97110-Therapeutic exercises, 97530- Therapeutic activity, [id number]- Neuromuscular re-education, 97535- Self Care, [id number]- Manual therapy, [id number]- Gait training, [phone]- Orthotic Initial, [phone]- Orthotic/Prosthetic subsequent, [phone]- Aquatic Therapy, [phone] (1-2 muscles), [id number] (3+ muscles)- Dry Needling, Patient/Family education, Balance training, Stair training, Taping, Vestibular training, Visual/preceptual remediation/compensation, DME instructions, and Wheelchair mobility training  PLAN FOR NEXT SESSION: HIGT! HEP for proximal L LE strength and addressing gait deviations   Delon DELENA Pop, PT Delon DELENA Pop, PT, DPT, CBIS  05/23/2024, 9:45 AM

## 2024-05-24 ENCOUNTER — Inpatient Hospital Stay: Admitting: Neurology

## 2024-05-28 ENCOUNTER — Ambulatory Visit

## 2024-05-28 VITALS — BP 117/73 | HR 67

## 2024-05-28 DIAGNOSIS — M6281 Muscle weakness (generalized): Secondary | ICD-10-CM

## 2024-05-28 DIAGNOSIS — R293 Abnormal posture: Secondary | ICD-10-CM

## 2024-05-28 DIAGNOSIS — R2689 Other abnormalities of gait and mobility: Secondary | ICD-10-CM

## 2024-05-28 DIAGNOSIS — R262 Difficulty in walking, not elsewhere classified: Secondary | ICD-10-CM

## 2024-05-28 NOTE — Therapy (Signed)
 OUTPATIENT PHYSICAL THERAPY NEURO EVALUATION   Patient Name: Richard [name] MRN: [id number] DOB:11/19/1955, 68 y.o., male Today's Date: 05/28/2024   PCP: Ezekiel Charleston, MD REFERRING PROVIDER: Wynne Flatten, MD  END OF SESSION:  PT End of Session - 05/28/24 0847     Visit Number 2    Number of Visits 15    Date for Recertification  07/13/24    Authorization Type [place]    Authorization Time Period 05/13/24-09/10/24    Authorization - Number of Visits 15    Progress Note Due on Visit 10    PT Start Time 0846    PT Stop Time 0928    PT Time Calculation (min) 42 min    Equipment Utilized During Treatment Gait belt    Activity Tolerance Patient tolerated treatment well    Behavior During Therapy [facility] for tasks assessed/performed          Past Medical History:  Diagnosis Date   Coronary artery disease    a. cath 04/02/2014 3v dx 80-90% in D1, 80-90% mid LAD, 95% apical LAD, 95% distal inferior LV branch of LCx, total occlusion of prox RCA  b). Cath 04/03/2014 DES to mid LAD, DES x3 to prox D1, DAPT indefinitely. LCx lesion untreated   GERD (gastroesophageal reflux disease)    HIV (human immunodeficiency virus infection) (HCC)    Hypertension    Ischemic cardiomyopathy 08/09/2013   EF 20-25 percent by echo in 04/2014, s/p [employer] [place] model [id number] serial number [id number]   Stroke [facility])    Past Surgical History:  Procedure Laterality Date   CARDIAC CATHETERIZATION  04/02/2014   DR BURNARD   ESOPHAGOGASTRODUODENOSCOPY (EGD) WITH PROPOFOL  N/A 09/08/2018   Procedure: ESOPHAGOGASTRODUODENOSCOPY (EGD) WITH PROPOFOL ;  Surgeon: Rollin Dover, MD;  Location: [facility];  Service: Endoscopy;  Laterality: N/A;   IMPLANTABLE CARDIOVERTER DEFIBRILLATOR IMPLANT N/A 09/10/2014   Procedure: IMPLANTABLE CARDIOVERTER DEFIBRILLATOR IMPLANT;  Surgeon: Jerel Balding, MD;  [facility] [place] model [phone] serial number [id number]   IR CT HEAD LTD  12/01/2023   IR PERCUTANEOUS ART  THROMBECTOMY/INFUSION INTRACRANIAL INC DIAG ANGIO  12/01/2023   IR US  GUIDE VASC ACCESS RIGHT  12/01/2023   LEFT AND RIGHT HEART CATHETERIZATION WITH CORONARY ANGIOGRAM N/A 04/02/2014   Procedure: LEFT AND RIGHT HEART CATHETERIZATION WITH CORONARY ANGIOGRAM;  Surgeon: Debby DELENA BURNARD, MD;  Location: [facility] CATH LAB;  Service: Cardiovascular;  Laterality: N/A;   MEDIAL COLLATERAL LIGAMENT REPAIR, KNEE Right 08/25/2015   Procedure: RIGHT THUMB RADICAL COLLATERAL LIGAMENT REPAIR;  Surgeon: Donnice Robinsons, MD;  Location: [facility] OR;  Service: Orthopedics;  Laterality: Right;   PERCUTANEOUS CORONARY STENT INTERVENTION (PCI-S) N/A 04/03/2014   Procedure: PERCUTANEOUS CORONARY STENT INTERVENTION (PCI-S);  Surgeon: Peter M Swaziland, MD;  Location: [facility] CATH LAB;  Service: Cardiovascular;  Laterality: N/A;   RADIOLOGY WITH ANESTHESIA N/A 12/01/2023   Procedure: RADIOLOGY WITH ANESTHESIA;  Surgeon: Radiologist, Medication, MD;  Location: [facility] OR;  Service: Radiology;  Laterality: N/A;  BETHANY BRADFORD DILATION N/A 09/08/2018   Procedure: SAVORY DILATION;  Surgeon: Rollin Dover, MD;  [facility];  Service: Endoscopy;  Laterality: N/A;   Patient Active Problem List   Diagnosis Date Noted   ICD (implantable cardioverter-defibrillator) discharge 03/23/2024   Hyperlipidemia 01/02/2024   CKD stage 3a, GFR 45-59 ml/min (HCC) 01/02/2024   Depression with anxiety 12/27/2023   Chronic systolic congestive heart failure (HCC) 12/23/2023   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   Acute ischemic right MCA stroke (  HCC) 12/01/2023   Schizophrenia, unspecified (HCC) 03/07/2023   Recurrent major depression 03/07/2023   Pseudophakia of left eye 03/07/2023   Acid reflux 03/07/2023   Dyspepsia 03/07/2023   Anxiety state 03/07/2023   Cortical age-related cataract of right eye 03/07/2023   Psychotic disorder (HCC) 03/07/2023   CAD (coronary artery disease) 03/07/2023   Abdominal mass 07/12/2021   Peripheral  vascular disease 02/04/2021   Overweight 01/21/2020   Dysphagia 12/04/2018   Hiatal hernia 04/19/2018   Gastroesophageal reflux disease 04/03/2018   Ischemic congestive cardiomyopathy (HCC) 10/17/2017   Generalized anxiety disorder 10/17/2017   Prediabetes 10/16/2017   Acquired thrombocytopenia 10/16/2017   Chronic kidney disease, stage 3 (HCC) 10/16/2017   Primary erectile dysfunction 10/13/2017   Chronic combined systolic and diastolic heart failure (HCC) 12/30/2015   Traumatic rupture of collateral ligament of other finger at metacarpophalangeal and interphalangeal joint, subsequent encounter 10/30/2015   Traumatic rupture of collateral ligament of other finger at metacarpophalangeal and interphalangeal joint, initial encounter 08/12/2015   VT (ventricular tachycardia) (HCC) 12/31/2014   ICD (implantable cardioverter-defibrillator) in place 09/10/2014   At risk for sudden cardiac death 09-08-14   Systolic heart failure (HCC) 08/09/2014   Coronary artery disease involving native coronary artery of native heart without angina pectoris 04/04/2014   Unstable angina (HCC) 04/03/2014   Cardiomyopathy, ischemic 03/22/2014   History of CVA (cerebrovascular accident) 02/20/2014   Human immunodeficiency virus infection (HCC) 02/20/2014   Essential hypertension 02/20/2014   Hyperlipidemia LDL goal <70 02/20/2014   Depression 02/20/2014   Cerebral infarction (HCC) 08/09/2006    ONSET DATE: 05/15/24 referral    REFERRING DIAG: I63.9 (ICD-10-CM) - Cerebral infarction, unspecified   THERAPY DIAG:  Abnormal posture  Difficulty in walking, not elsewhere classified  Muscle weakness (generalized)  Other abnormalities of gait and mobility  Rationale for Evaluation and Treatment: Rehabilitation  SUBJECTIVE:                                                                                                                                                                                              SUBJECTIVE STATEMENT: Patient arrives to clinic in standard wheelchair with wife. He had a [facility] CVA in April. Went to CIR and then received [facility] therapy. Walks around his house with [facility] and supervision. Does have a L AFO, which he received in CIR. Only wears it when leaving the house- otherwise walks with no shoes and no brace in the house.  Pt accompanied by: significant other  PERTINENT HISTORY: CAD, GERD, HIV, HTN, ischemic cardiomyopathy, CVA  PAIN:  Are you having pain? No  PRECAUTIONS: Fall and ICD/Pacemaker;  L hemi   WEIGHT BEARING RESTRICTIONS: No  FALLS: Has patient fallen in last 6 months? Yes. Number of falls 4; slid off sofa, fell off bed, nearly fell off commode into tub   LIVING ENVIRONMENT: Lives with: lives with their spouse Lives in: House/apartment Stairs: Yes: External: 1 steps; on right going up Has following equipment at home: Hemi walker, Wheelchair (manual), Shower bench, bed side commode, and Grab bars  PLOF: Independent  PATIENT GOALS: to walk and use my hand   OBJECTIVE:  Note: Objective measures were completed at Evaluation unless otherwise noted.  DIAGNOSTIC FINDINGS: 12/01/23 head CT IMPRESSION: 1. Subtle hypoattenuation involving the right caudate and right lentiform nuclei which may reflect acute infarct. 2. No acute intracranial hemorrhage. 3. Remote infarct involving the right frontal operculum, insula, and superior right temporal lobe. 4. Asymmetric density of the right M1 segment. Recommend correlation with CTA. 5. ASPECTS is 8   GAIT: Findings: Gait Characteristics: step to pattern, decreased arm swing- Left, decreased step length- Right, decreased stance time- Left, decreased stride length, decreased hip/knee flexion- Left, decreased ankle dorsiflexion- Left, circumduction- Left, Left hip hike, trunk rotated posterior- Left, trunk flexed, narrow BOS, and poor foot clearance- Left, Assistive device utilized:Hemi walker, and Level of  assistance: CGA and Min A                                                                                                             TREATMENT  Gait:  -HIGT on treadmill:  -0.16mph for 2'30 with harness for fall precautions   -MinA-> MaxA to break L LE extensor tone   -6-8/10 RPE  -overground gait x54ft with HW + MinA   -emphasis on increased R LE step length and wider BOS  PATIENT EDUCATION: Education details: continue gait with HW + assist PRN at home  Person educated: Patient and Spouse Education method: Explanation Education comprehension: verbalized understanding and needs further education  HOME EXERCISE PROGRAM: To be provided  GOALS: Goals reviewed with patient? Yes  SHORT TERM GOALS: Target date: 06/22/24  Pt will be independent with initial HEP for improved functional strength and balance  Baseline: to be provided  Goal status: INITIAL  2.  Pt will improve gait speed to >/= .55m/s to demonstrate improved community ambulation  Baseline: .65m/s with HW + CGA Goal status: INITIAL  3.  Pt will improve TUG to </= 70 secs to demonstrated reduced fall risk  Baseline: 98s with HW + MinA Goal status: INITIAL  4.  Pt will improve 5x STS to </= 22 sec to demo improved functional LE strength and balance   Baseline: 30.81 with CGA + R UE Goal status: INITIAL   LONG TERM GOALS: Target date: 07/13/24 (to match cert date)   Pt will be independent with final HEP for improved functional strength and balance  Baseline: to be provided  Goal status: INITIAL  Pt will improve gait speed to >/= .76m/s to demonstrate improved community ambulation  Baseline: .40m/s with HW + CGA Goal status: INITIAL  Pt will  improve TUG to </= 50 secs to demonstrated reduced fall risk  Baseline: 98s with HW + MinA Goal status: INITIAL  Pt will improve 5x STS to </= 15 sec to demo improved functional LE strength and balance   Baseline: 30.81 with CGA + R UE Goal status:  INITIAL  ASSESSMENT:  CLINICAL IMPRESSION: Patient seen for skilled PT session with emphasis on high intensity gait training. BP slightly soft at beginning of session, but increased appropriately with gait on treadmill. He required MOdA of 2 to safely step onto the treadmill safely. With increasing time/distance ambulated, increasing L LE extensor tone noted requiring PT to apply more force to posterior L knee to break tone. Good carryover noted to overground gait with HW, but AD might by impacting patients ability to take a longer R step length and may benefit from a trial of  LBQC. Continue POC.    OBJECTIVE IMPAIRMENTS: Abnormal gait, decreased balance, decreased coordination, decreased endurance, decreased knowledge of condition, decreased knowledge of use of DME, decreased ROM, decreased strength, impaired tone, impaired UE functional use, and postural dysfunction.   ACTIVITY LIMITATIONS: carrying, lifting, bending, standing, squatting, stairs, transfers, bed mobility, locomotion level, and caring for others  PARTICIPATION LIMITATIONS: meal prep, cleaning, interpersonal relationship, driving, shopping, community activity, occupation, and yard work  PERSONAL FACTORS: Age, Fitness, Past/current experiences, Time since onset of injury/illness/exacerbation, Transportation, and 3+ comorbidities: see above are also affecting patient's functional outcome.   REHAB POTENTIAL: Fair time since onset  CLINICAL DECISION MAKING: Stable/uncomplicated  EVALUATION COMPLEXITY: Low  PLAN:  PT FREQUENCY: 2x/week  PT DURATION: other: 7 weeks  PLANNED INTERVENTIONS: 97164- PT Re-evaluation, 97750- Physical Performance Testing, 97110-Therapeutic exercises, 97530- Therapeutic activity, [id number]- Neuromuscular re-education, 97535- Self Care, 02859- Manual therapy, [id number]- Gait training, [phone]- Orthotic Initial, [phone]- Orthotic/Prosthetic subsequent, [phone]- Aquatic Therapy, [phone] (1-2 muscles), [phone] (3+  muscles)- Dry Needling, Patient/Family education, Balance training, Stair training, Taping, Vestibular training, Visual/preceptual remediation/compensation, DME instructions, and Wheelchair mobility training  PLAN FOR NEXT SESSION: HIGT! HEP for proximal L LE strength and addressing gait deviations; progress to quad cane to allow for more reciprocal gait?   Delon DELENA Pop, PT Delon DELENA Pop, PT, DPT, CBIS  05/28/2024, 10:10 AM

## 2024-05-29 ENCOUNTER — Ambulatory Visit
Admission: RE | Admit: 2024-05-29 | Discharge: 2024-05-29 | Disposition: A | Discharge status: Home / Self Care | Source: Ambulatory Visit | Attending: General Surgery | Admitting: General Surgery

## 2024-05-29 DIAGNOSIS — K689 Other disorders of retroperitoneum: Secondary | ICD-10-CM | POA: Diagnosis not present

## 2024-05-29 DIAGNOSIS — R1900 Intra-abdominal and pelvic swelling, mass and lump, unspecified site: Secondary | ICD-10-CM

## 2024-05-30 ENCOUNTER — Ambulatory Visit

## 2024-05-30 VITALS — BP 112/61 | HR 58

## 2024-05-30 DIAGNOSIS — R293 Abnormal posture: Secondary | ICD-10-CM

## 2024-05-30 DIAGNOSIS — M6281 Muscle weakness (generalized): Secondary | ICD-10-CM

## 2024-05-30 DIAGNOSIS — R262 Difficulty in walking, not elsewhere classified: Secondary | ICD-10-CM

## 2024-05-30 DIAGNOSIS — R2689 Other abnormalities of gait and mobility: Secondary | ICD-10-CM

## 2024-05-30 NOTE — Therapy (Signed)
 OUTPATIENT PHYSICAL THERAPY NEURO TREATMENT   Patient Name: Richard [name] MRN: [id number] DOB:Jul 22, 1956, 68 y.o., male Today's Date: 05/30/2024   PCP: Ezekiel Charleston, MD REFERRING PROVIDER: Wynne Flatten, MD  END OF SESSION:  PT End of Session - 05/30/24 0848     Visit Number 3    Number of Visits 15    Date for Recertification  07/13/24    Authorization Type [place]    Authorization Time Period 05/13/24-09/10/24    Authorization - Number of Visits 15    Progress Note Due on Visit 10    PT Start Time 0846    PT Stop Time 0930    PT Time Calculation (min) 44 min    Equipment Utilized During Treatment Gait belt    Activity Tolerance Patient tolerated treatment well    Behavior During Therapy WFL for tasks assessed/performed          Past Medical History:  Diagnosis Date   Coronary artery disease    a. cath 04/02/2014 3v dx 80-90% in D1, 80-90% mid LAD, 95% apical LAD, 95% distal inferior LV branch of LCx, total occlusion of prox RCA  b). Cath 04/03/2014 DES to mid LAD, DES x3 to prox D1, DAPT indefinitely. LCx lesion untreated   GERD (gastroesophageal reflux disease)    HIV (human immunodeficiency virus infection) (HCC)    Hypertension    Ischemic cardiomyopathy 08/09/2013   EF 20-25 percent by echo in 04/2014, s/p [employer] Jude [place] model [id number] serial number [id number]   Stroke [facility])    Past Surgical History:  Procedure Laterality Date   CARDIAC CATHETERIZATION  04/02/2014   DR BURNARD   ESOPHAGOGASTRODUODENOSCOPY (EGD) WITH PROPOFOL  N/A 09/08/2018   Procedure: ESOPHAGOGASTRODUODENOSCOPY (EGD) WITH PROPOFOL ;  Surgeon: Rollin Dover, MD;  [facility];  Service: Endoscopy;  Laterality: N/A;   IMPLANTABLE CARDIOVERTER DEFIBRILLATOR IMPLANT N/A 09/10/2014   Procedure: IMPLANTABLE CARDIOVERTER DEFIBRILLATOR IMPLANT;  Surgeon: Jerel Balding, MD;  [facility] [place] model [phone] serial number [id number]   IR CT HEAD LTD  12/01/2023   IR PERCUTANEOUS ART  THROMBECTOMY/INFUSION INTRACRANIAL INC DIAG ANGIO  12/01/2023   IR US  GUIDE VASC ACCESS RIGHT  12/01/2023   LEFT AND RIGHT HEART CATHETERIZATION WITH CORONARY ANGIOGRAM N/A 04/02/2014   Procedure: LEFT AND RIGHT HEART CATHETERIZATION WITH CORONARY ANGIOGRAM;  Surgeon: Debby DELENA BURNARD, MD;  Location[facility] CATH LAB;  Service: Cardiovascular;  Laterality: N/A;   MEDIAL COLLATERAL LIGAMENT REPAIR, KNEE Right 08/25/2015   Procedure: RIGHT THUMB RADICAL COLLATERAL LIGAMENT REPAIR;  Surgeon: Donnice Robinsons, MD;  [facility]: [facility] OR;  Service: Orthopedics;  Laterality: Right;   PERCUTANEOUS CORONARY STENT INTERVENTION (PCI-S) N/A 04/03/2014   Procedure: PERCUTANEOUS CORONARY STENT INTERVENTION (PCI-S);  Surgeon: Peter M Swaziland, MD;  Location: [facility] CATH LAB;  Service: Cardiovascular;  Laterality: N/A;   RADIOLOGY WITH ANESTHESIA N/A 12/01/2023   Procedure: RADIOLOGY WITH ANESTHESIA;  Surgeon: Radiologist, Medication, MD;  Location: [facility] OR;  Service: Radiology;  Laterality: N/A;  BETHANY BRADFORD DILATION N/A 09/08/2018   Procedure: SAVORY DILATION;  Surgeon: Rollin Dover, MD;  [facility]: [facility];  Service: Endoscopy;  Laterality: N/A;   Patient Active Problem List   Diagnosis Date Noted   ICD (implantable cardioverter-defibrillator) discharge 03/23/2024   Hyperlipidemia 01/02/2024   CKD stage 3a, GFR 45-59 ml/min (HCC) 01/02/2024   Depression with anxiety 12/27/2023   Chronic systolic congestive heart failure (HCC) 12/23/2023   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   Acute ischemic right MCA stroke (  HCC) 12/01/2023   Schizophrenia, unspecified (HCC) 03/07/2023   Recurrent major depression 03/07/2023   Pseudophakia of left eye 03/07/2023   Acid reflux 03/07/2023   Dyspepsia 03/07/2023   Anxiety state 03/07/2023   Cortical age-related cataract of right eye 03/07/2023   Psychotic disorder (HCC) 03/07/2023   CAD (coronary artery disease) 03/07/2023   Abdominal mass 07/12/2021   Peripheral  vascular disease 02/04/2021   Overweight 01/21/2020   Dysphagia 12/04/2018   Hiatal hernia 04/19/2018   Gastroesophageal reflux disease 04/03/2018   Ischemic congestive cardiomyopathy (HCC) 10/17/2017   Generalized anxiety disorder 10/17/2017   Prediabetes 10/16/2017   Acquired thrombocytopenia 10/16/2017   Chronic kidney disease, stage 3 (HCC) 10/16/2017   Primary erectile dysfunction 10/13/2017   Chronic combined systolic and diastolic heart failure (HCC) 12/30/2015   Traumatic rupture of collateral ligament of other finger at metacarpophalangeal and interphalangeal joint, subsequent encounter 10/30/2015   Traumatic rupture of collateral ligament of other finger at metacarpophalangeal and interphalangeal joint, initial encounter 08/12/2015   VT (ventricular tachycardia) (HCC) 12/31/2014   ICD (implantable cardioverter-defibrillator) in place 09/10/2014   At risk for sudden cardiac death 08-26-2014   Systolic heart failure (HCC) 08/09/2014   Coronary artery disease involving native coronary artery of native heart without angina pectoris 04/04/2014   Unstable angina (HCC) 04/03/2014   Cardiomyopathy, ischemic 03/22/2014   History of CVA (cerebrovascular accident) 02/20/2014   Human immunodeficiency virus infection (HCC) 02/20/2014   Essential hypertension 02/20/2014   Hyperlipidemia LDL goal <70 02/20/2014   Depression 02/20/2014   Cerebral infarction (HCC) 08/09/2006    ONSET DATE: 05/15/24 referral    REFERRING DIAG: I63.9 (ICD-10-CM) - Cerebral infarction, unspecified   THERAPY DIAG:  Abnormal posture  Difficulty in walking, not elsewhere classified  Muscle weakness (generalized)  Other abnormalities of gait and mobility  Rationale for Evaluation and Treatment: Rehabilitation  SUBJECTIVE:                                                                                                                                                                                              SUBJECTIVE STATEMENT: Patient arrives to clinic in standard wc. Denies falls. Legs felt good after last session.  Pt accompanied by: significant other  PERTINENT HISTORY: CAD, GERD, HIV, HTN, ischemic cardiomyopathy, CVA  PAIN:  Are you having pain? No  PRECAUTIONS: Fall and ICD/Pacemaker; L hemi   WEIGHT BEARING RESTRICTIONS: No  FALLS: Has patient fallen in last 6 months? Yes. Number of falls 4; slid off sofa, fell off bed, nearly fell off commode into tub   LIVING ENVIRONMENT: Lives with: lives with their spouse Lives  in: House/apartment Stairs: Yes: External: 1 steps; on right going up Has following equipment at home: Hemi walker, Wheelchair (manual), Shower bench, bed side commode, and Grab bars  PLOF: Independent  PATIENT GOALS: to walk and use my hand   OBJECTIVE:  Note: Objective measures were completed at Evaluation unless otherwise noted.  DIAGNOSTIC FINDINGS: 12/01/23 head CT IMPRESSION: 1. Subtle hypoattenuation involving the right caudate and right lentiform nuclei which may reflect acute infarct. 2. No acute intracranial hemorrhage. 3. Remote infarct involving the right frontal operculum, insula, and superior right temporal lobe. 4. Asymmetric density of the right M1 segment. Recommend correlation with CTA. 5. ASPECTS is 8   GAIT: Findings: Gait Characteristics: step to pattern, decreased arm swing- Left, decreased step length- Right, decreased stance time- Left, decreased stride length, decreased hip/knee flexion- Left, decreased ankle dorsiflexion- Left, circumduction- Left, Left hip hike, trunk rotated posterior- Left, trunk flexed, narrow BOS, and poor foot clearance- Left, Assistive device utilized:Hemi walker, and Level of assistance: CGA and Min A                                                                                                             TREATMENT  NMR:  -scifit hills level 3x8 mins B LE + R UE for neural priming  -standing L  LE march over   -had to remove physical barrier d/t decreased foot clearance with adduction due to extensor tone -stagger sit <> stand biasing L LE   Gait:  -82ft, 34ft LBQC   -MinA provided   -improved reciprocal gait pattern noted, remained slightly more narrow  -with increasing fatigue, noted increased vaulting on R LE   PATIENT EDUCATION: Education details: continue gait with HW + assist PRN at home  Person educated: Patient and Spouse Education method: Explanation Education comprehension: verbalized understanding and needs further education  HOME EXERCISE PROGRAM: To be provided  GOALS: Goals reviewed with patient? Yes  SHORT TERM GOALS: Target date: 06/22/24  Pt will be independent with initial HEP for improved functional strength and balance  Baseline: to be provided  Goal status: INITIAL  2.  Pt will improve gait speed to >/= .36m/s to demonstrate improved community ambulation  Baseline: .74m/s with HW + CGA Goal status: INITIAL  3.  Pt will improve TUG to </= 70 secs to demonstrated reduced fall risk  Baseline: 98s with HW + MinA Goal status: INITIAL  4.  Pt will improve 5x STS to </= 22 sec to demo improved functional LE strength and balance   Baseline: 30.81 with CGA + R UE Goal status: INITIAL   LONG TERM GOALS: Target date: 07/13/24 (to match cert date)   Pt will be independent with final HEP for improved functional strength and balance  Baseline: to be provided  Goal status: INITIAL  Pt will improve gait speed to >/= .57m/s to demonstrate improved community ambulation  Baseline: .11m/s with HW + CGA Goal status: INITIAL  Pt will improve TUG to </= 50 secs to demonstrated reduced fall risk  Baseline: 98s with  HW + MinA Goal status: INITIAL  Pt will improve 5x STS to </= 15 sec to demo improved functional LE strength and balance   Baseline: 30.81 with CGA + R UE Goal status: INITIAL  ASSESSMENT:  CLINICAL IMPRESSION: Patient seen for  skilled PT session with emphasis on gait training with LBQC + L LE NMR. He remains safest ambulating with a HW at home, discussed with patient and wife and they verbalized understanding. LBQC allowed for more reciprocal and normalized gait pattern. It did challenge his balance further resulting in increased fatigue, but overall tolerated well. Continue POC.    OBJECTIVE IMPAIRMENTS: Abnormal gait, decreased balance, decreased coordination, decreased endurance, decreased knowledge of condition, decreased knowledge of use of DME, decreased ROM, decreased strength, impaired tone, impaired UE functional use, and postural dysfunction.   ACTIVITY LIMITATIONS: carrying, lifting, bending, standing, squatting, stairs, transfers, bed mobility, locomotion level, and caring for others  PARTICIPATION LIMITATIONS: meal prep, cleaning, interpersonal relationship, driving, shopping, community activity, occupation, and yard work  PERSONAL FACTORS: Age, Fitness, Past/current experiences, Time since onset of injury/illness/exacerbation, Transportation, and 3+ comorbidities: see above are also affecting patient's functional outcome.   REHAB POTENTIAL: Fair time since onset  CLINICAL DECISION MAKING: Stable/uncomplicated  EVALUATION COMPLEXITY: Low  PLAN:  PT FREQUENCY: 2x/week  PT DURATION: other: 7 weeks  PLANNED INTERVENTIONS: 97164- PT Re-evaluation, 97750- Physical Performance Testing, 97110-Therapeutic exercises, 97530- Therapeutic activity, [id number]- Neuromuscular re-education, 97535- Self Care, 02859- Manual therapy, [id number]- Gait training, [phone]- Orthotic Initial, [phone]- Orthotic/Prosthetic subsequent, [phone]- Aquatic Therapy, [phone] (1-2 muscles), [phone] (3+ muscles)- Dry Needling, Patient/Family education, Balance training, Stair training, Taping, Vestibular training, Visual/preceptual remediation/compensation, DME instructions, and Wheelchair mobility training  PLAN FOR NEXT SESSION: HIGT! HEP for proximal L  LE strength and addressing gait deviations; progress to quad cane to allow for more reciprocal gait?   Delon DELENA Pop, PT Delon DELENA Pop, PT, DPT, CBIS  05/30/2024, 10:21 AM

## 2024-06-04 ENCOUNTER — Ambulatory Visit

## 2024-06-04 VITALS — BP 106/64 | HR 55

## 2024-06-04 DIAGNOSIS — M6281 Muscle weakness (generalized): Secondary | ICD-10-CM

## 2024-06-04 DIAGNOSIS — R293 Abnormal posture: Secondary | ICD-10-CM | POA: Diagnosis not present

## 2024-06-04 DIAGNOSIS — R262 Difficulty in walking, not elsewhere classified: Secondary | ICD-10-CM

## 2024-06-04 NOTE — Therapy (Signed)
 OUTPATIENT PHYSICAL THERAPY NEURO TREATMENT   Patient Name: Richard [name] MRN: [id number] DOB:1956/04/03, 68 y.o., male Today's Date: 06/04/2024   PCP: Ezekiel Charleston, MD REFERRING PROVIDER: Wynne Flatten, MD  END OF SESSION:  PT End of Session - 06/04/24 1202     Visit Number 4    Number of Visits 15    Date for Recertification  07/13/24    Authorization Type [place]    Authorization Time Period 05/13/24-09/10/24    Authorization - Number of Visits 15    Progress Note Due on Visit 10    PT Start Time 0846    PT Stop Time 0929    PT Time Calculation (min) 43 min    Equipment Utilized During Treatment Gait belt    Activity Tolerance Patient tolerated treatment well    Behavior During Therapy WFL for tasks assessed/performed           Past Medical History:  Diagnosis Date   Coronary artery disease    a. cath 04/02/2014 3v dx 80-90% in D1, 80-90% mid LAD, 95% apical LAD, 95% distal inferior LV branch of LCx, total occlusion of prox RCA  b). Cath 04/03/2014 DES to mid LAD, DES x3 to prox D1, DAPT indefinitely. LCx lesion untreated   GERD (gastroesophageal reflux disease)    HIV (human immunodeficiency virus infection) (HCC)    Hypertension    Ischemic cardiomyopathy 08/09/2013   EF 20-25 percent by echo in 04/2014, s/p [employer] [place] model [id number] serial number [id number]   Stroke [facility])    Past Surgical History:  Procedure Laterality Date   CARDIAC CATHETERIZATION  04/02/2014   DR BURNARD   ESOPHAGOGASTRODUODENOSCOPY (EGD) WITH PROPOFOL  N/A 09/08/2018   Procedure: ESOPHAGOGASTRODUODENOSCOPY (EGD) WITH PROPOFOL ;  Surgeon: Rollin Dover, MD;  Location: [facility];  Service: Endoscopy;  Laterality: N/A;   IMPLANTABLE CARDIOVERTER DEFIBRILLATOR IMPLANT N/A 09/10/2014   Procedure: IMPLANTABLE CARDIOVERTER DEFIBRILLATOR IMPLANT;  Surgeon: Jerel Balding, MD;  [facility] model [id number] serial number [id number]   IR CT HEAD LTD  12/01/2023   IR PERCUTANEOUS ART  THROMBECTOMY/INFUSION INTRACRANIAL INC DIAG ANGIO  12/01/2023   IR US  GUIDE VASC ACCESS RIGHT  12/01/2023   LEFT AND RIGHT HEART CATHETERIZATION WITH CORONARY ANGIOGRAM N/A 04/02/2014   Procedure: LEFT AND RIGHT HEART CATHETERIZATION WITH CORONARY ANGIOGRAM;  Surgeon: Debby DELENA Burnard, MD;  Location: [facility] CATH LAB;  Service: Cardiovascular;  Laterality: N/A;   MEDIAL COLLATERAL LIGAMENT REPAIR, KNEE Right 08/25/2015   Procedure: RIGHT THUMB RADICAL COLLATERAL LIGAMENT REPAIR;  Surgeon: Donnice Robinsons, MD;  Location: [facility] OR;  Service: Orthopedics;  Laterality: Right;   PERCUTANEOUS CORONARY STENT INTERVENTION (PCI-S) N/A 04/03/2014   Procedure: PERCUTANEOUS CORONARY STENT INTERVENTION (PCI-S);  Surgeon: Peter M Jordan, MD;  Location: [facility] CATH LAB;  Service: Cardiovascular;  Laterality: N/A;   RADIOLOGY WITH ANESTHESIA N/A 12/01/2023   Procedure: RADIOLOGY WITH ANESTHESIA;  Surgeon: Radiologist, Medication, MD;  Location: [facility] OR;  Service: Radiology;  Laterality: N/A;  BETHANY BRADFORD DILATION N/A 09/08/2018   Procedure: SAVORY DILATION;  Surgeon: Rollin Dover, MD;  Location: [facility];  Service: Endoscopy;  Laterality: N/A;   Patient Active Problem List   Diagnosis Date Noted   ICD (implantable cardioverter-defibrillator) discharge 03/23/2024   Hyperlipidemia 01/02/2024   CKD stage 3a, GFR 45-59 ml/min (HCC) 01/02/2024   Depression with anxiety 12/27/2023   Chronic systolic congestive heart failure (HCC) 12/23/2023   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   Acute ischemic right MCA  stroke (HCC) 12/01/2023   Schizophrenia, unspecified (HCC) 03/07/2023   Recurrent major depression 03/07/2023   Pseudophakia of left eye 03/07/2023   Acid reflux 03/07/2023   Dyspepsia 03/07/2023   Anxiety state 03/07/2023   Cortical age-related cataract of right eye 03/07/2023   Psychotic disorder (HCC) 03/07/2023   CAD (coronary artery disease) 03/07/2023   Abdominal mass 07/12/2021   Peripheral  vascular disease 02/04/2021   Overweight 01/21/2020   Dysphagia 12/04/2018   Hiatal hernia 04/19/2018   Gastroesophageal reflux disease 04/03/2018   Ischemic congestive cardiomyopathy (HCC) 10/17/2017   Generalized anxiety disorder 10/17/2017   Prediabetes 10/16/2017   Acquired thrombocytopenia 10/16/2017   Chronic kidney disease, stage 3 (HCC) 10/16/2017   Primary erectile dysfunction 10/13/2017   Chronic combined systolic and diastolic heart failure (HCC) 12/30/2015   Traumatic rupture of collateral ligament of other finger at metacarpophalangeal and interphalangeal joint, subsequent encounter 10/30/2015   Traumatic rupture of collateral ligament of other finger at metacarpophalangeal and interphalangeal joint, initial encounter 08/12/2015   VT (ventricular tachycardia) (HCC) 12/31/2014   ICD (implantable cardioverter-defibrillator) in place 09/10/2014   At risk for sudden cardiac death 08/24/2014   Systolic heart failure (HCC) 08/09/2014   Coronary artery disease involving native coronary artery of native heart without angina pectoris 04/04/2014   Unstable angina (HCC) 04/03/2014   Cardiomyopathy, ischemic 03/22/2014   History of CVA (cerebrovascular accident) 02/20/2014   Human immunodeficiency virus infection (HCC) 02/20/2014   Essential hypertension 02/20/2014   Hyperlipidemia LDL goal <70 02/20/2014   Depression 02/20/2014   Cerebral infarction (HCC) 08/09/2006    ONSET DATE: 05/15/24 referral    REFERRING DIAG: I63.9 (ICD-10-CM) - Cerebral infarction, unspecified   THERAPY DIAG:  Abnormal posture  Difficulty in walking, not elsewhere classified  Muscle weakness (generalized)  Rationale for Evaluation and Treatment: Rehabilitation  SUBJECTIVE:                                                                                                                                                                                             SUBJECTIVE STATEMENT: No changes since  last time, no falls since last visit. Patient states that they went home and took a nap after session but no soreness present, just tired. Patient denies pain today. Pt accompanied by: significant other  PERTINENT HISTORY: CAD, GERD, HIV, HTN, ischemic cardiomyopathy, CVA  PAIN:  Are you having pain? No  PRECAUTIONS: Fall and ICD/Pacemaker; L hemi   WEIGHT BEARING RESTRICTIONS: No  FALLS: Has patient fallen in last 6 months? Yes. Number of falls 4; slid off sofa, fell off bed, nearly fell off commode into tub  LIVING ENVIRONMENT: Lives with: lives with their spouse Lives in: House/apartment Stairs: Yes: External: 1 steps; on right going up Has following equipment at home: Hemi walker, Wheelchair (manual), Shower bench, bed side commode, and Grab bars  PLOF: Independent  PATIENT GOALS: to walk and use my hand   OBJECTIVE:  Note: Objective measures were completed at Evaluation unless otherwise noted.  DIAGNOSTIC FINDINGS: 12/01/23 head CT IMPRESSION: 1. Subtle hypoattenuation involving the right caudate and right lentiform nuclei which may reflect acute infarct. 2. No acute intracranial hemorrhage. 3. Remote infarct involving the right frontal operculum, insula, and superior right temporal lobe. 4. Asymmetric density of the right M1 segment. Recommend correlation with CTA. 5. ASPECTS is 8   GAIT: Findings: Gait Characteristics: step to pattern, decreased arm swing- Left, decreased step length- Right, decreased stance time- Left, decreased stride length, decreased hip/knee flexion- Left, decreased ankle dorsiflexion- Left, circumduction- Left, Left hip hike, trunk rotated posterior- Left, trunk flexed, narrow BOS, and poor foot clearance- Left, Assistive device utilized:Hemi walker, and Level of assistance: CGA and Min A                                                                                                             TREATMENT  NMR:  - Sit to stand x 5 with  emphasis on weight shifting to L LE - Sit to stand with large ball toss, 1 set x 10 and 1 set x 5 w/L LE stagger, emphasis on weight shifting to L LE, quad and abductor activation, eccentric return to sitting, use to L UE during ball throws *Memory and dual tasks used during ball throw to recall throw and sitting sequence  Therapeutic activity: -SciFit, hills level 3, 6 mins B LE + R UE, reciprocal patterning for gait, neural priming  - Gait with quad cane x 115 feet (1 lap on track), min assist, cueing used for increased step length, improved reciprocal gait pattern *Rest breaks throughout due to fatigue   PATIENT EDUCATION: Education details: education on safe gait at home with HW and assistance, sit to stand should be using L LE/proper positioning w/sit to stand Person educated: Patient and Spouse Education method: Explanation Education comprehension: verbalized understanding and needs further education  HOME EXERCISE PROGRAM: To be provided  GOALS: Goals reviewed with patient? Yes  SHORT TERM GOALS: Target date: 06/22/24  Pt will be independent with initial HEP for improved functional strength and balance  Baseline: to be provided  Goal status: INITIAL  2.  Pt will improve gait speed to >/= .62m/s to demonstrate improved community ambulation  Baseline: .75m/s with HW + CGA Goal status: INITIAL  3.  Pt will improve TUG to </= 70 secs to demonstrated reduced fall risk  Baseline: 98s with HW + MinA Goal status: INITIAL  4.  Pt will improve 5x STS to </= 22 sec to demo improved functional LE strength and balance   Baseline: 30.81 with CGA + R UE Goal status: INITIAL   LONG TERM GOALS: Target date: 07/13/24 (to match cert  date)   Pt will be independent with final HEP for improved functional strength and balance  Baseline: to be provided  Goal status: INITIAL  Pt will improve gait speed to >/= .15m/s to demonstrate improved community ambulation  Baseline: .81m/s with  HW + CGA Goal status: INITIAL  Pt will improve TUG to </= 50 secs to demonstrated reduced fall risk  Baseline: 98s with HW + MinA Goal status: INITIAL  Pt will improve 5x STS to </= 15 sec to demo improved functional LE strength and balance   Baseline: 30.81 with CGA + R UE Goal status: INITIAL  ASSESSMENT:  CLINICAL IMPRESSION: Patient tolerated session well with fatigue noted throughout session at about 6-7/10. Seated rest needed during session due to fatigue. Session continued to focus on gait for improved endurance and safe ambulation with cuing for step length and weight shifting to L LE throughout. LBQC and contact guard assist.Continue ambulating at home with HW. Patient continues to benefit from skilled physical therapy in order to improved deficits as listed above. Continue POC.    OBJECTIVE IMPAIRMENTS: Abnormal gait, decreased balance, decreased coordination, decreased endurance, decreased knowledge of condition, decreased knowledge of use of DME, decreased ROM, decreased strength, impaired tone, impaired UE functional use, and postural dysfunction.   ACTIVITY LIMITATIONS: carrying, lifting, bending, standing, squatting, stairs, transfers, bed mobility, locomotion level, and caring for others  PARTICIPATION LIMITATIONS: meal prep, cleaning, interpersonal relationship, driving, shopping, community activity, occupation, and yard work  PERSONAL FACTORS: Age, Fitness, Past/current experiences, Time since onset of injury/illness/exacerbation, Transportation, and 3+ comorbidities: see above are also affecting patient's functional outcome.   REHAB POTENTIAL: Fair time since onset  CLINICAL DECISION MAKING: Stable/uncomplicated  EVALUATION COMPLEXITY: Low  PLAN:  PT FREQUENCY: 2x/week  PT DURATION: other: 7 weeks  PLANNED INTERVENTIONS: 97164- PT Re-evaluation, 97750- Physical Performance Testing, 97110-Therapeutic exercises, 97530- Therapeutic activity, [id number]- Neuromuscular  re-education, 97535- Self Care, [id number]- Manual therapy, [id number]- Gait training, [phone]- Orthotic Initial, [phone]- Orthotic/Prosthetic subsequent, [phone]- Aquatic Therapy, [phone] (1-2 muscles), [id number] (3+ muscles)- Dry Needling, Patient/Family education, Balance training, Stair training, Taping, Vestibular training, Visual/preceptual remediation/compensation, DME instructions, and Wheelchair mobility training  PLAN FOR NEXT SESSION: HIGT! HEP for proximal L LE strength and addressing gait deviations; progress to quad cane to allow for more reciprocal gait? Continue use of L LE, weight shifting in bars with toe taps, improve L hip flexion strength.   Emmalene Sherry, Student-PT Delon DELENA Pop, PT, DPT, CBIS  06/04/2024, 12:04 PM

## 2024-06-05 ENCOUNTER — Other Ambulatory Visit

## 2024-06-06 ENCOUNTER — Ambulatory Visit

## 2024-06-06 VITALS — BP 109/66 | HR 52

## 2024-06-06 DIAGNOSIS — R293 Abnormal posture: Secondary | ICD-10-CM | POA: Diagnosis not present

## 2024-06-06 DIAGNOSIS — R262 Difficulty in walking, not elsewhere classified: Secondary | ICD-10-CM

## 2024-06-06 DIAGNOSIS — R2689 Other abnormalities of gait and mobility: Secondary | ICD-10-CM

## 2024-06-06 DIAGNOSIS — M6281 Muscle weakness (generalized): Secondary | ICD-10-CM

## 2024-06-06 NOTE — Therapy (Signed)
 OUTPATIENT PHYSICAL THERAPY NEURO TREATMENT   Patient Name: Richard [name] MRN: [id number] DOB:[birth year], 68 y.o., male Today's Date: 06/06/2024   PCP: Ezekiel Charleston, MD REFERRING PROVIDER: Wynne Flatten, MD  END OF SESSION:  PT End of Session - 06/06/24 0828     Visit Number 5    Number of Visits 15    Date for Recertification  07/13/24    Authorization Type [place]    Authorization Time Period 05/13/24-09/10/24    Authorization - Number of Visits 15    Progress Note Due on Visit 10    PT Start Time 0845    PT Stop Time 0927    PT Time Calculation (min) 42 min    Equipment Utilized During Treatment Gait belt    Activity Tolerance Patient tolerated treatment well    Behavior During Therapy WFL for tasks assessed/performed           Past Medical History:  Diagnosis Date   Coronary artery disease    a. cath 04/02/2014 3v dx 80-90% in D1, 80-90% mid LAD, 95% apical LAD, 95% distal inferior LV branch of LCx, total occlusion of prox RCA  b). Cath 04/03/2014 DES to mid LAD, DES x3 to prox D1, DAPT indefinitely. LCx lesion untreated   GERD (gastroesophageal reflux disease)    HIV (human immunodeficiency virus infection) (HCC)    Hypertension    Ischemic cardiomyopathy 08/09/2013   EF 20-25 percent by echo in 04/2014, s/p [employer] [place] model [id number] serial number [id number]   Stroke [facility])    Past Surgical History:  Procedure Laterality Date   CARDIAC CATHETERIZATION  04/02/2014   DR BURNARD   ESOPHAGOGASTRODUODENOSCOPY (EGD) WITH PROPOFOL  N/A 09/08/2018   Procedure: ESOPHAGOGASTRODUODENOSCOPY (EGD) WITH PROPOFOL ;  Surgeon: Rollin Dover, MD;  Location: [facility] ENDOSCOPY;  Service: Endoscopy;  Laterality: N/A;   IMPLANTABLE CARDIOVERTER DEFIBRILLATOR IMPLANT N/A 09/10/2014   Procedure: IMPLANTABLE CARDIOVERTER DEFIBRILLATOR IMPLANT;  Surgeon: Jerel Balding, MD;  [facility] model [id number] serial number [id number]   IR CT HEAD LTD  12/01/2023   IR PERCUTANEOUS ART  THROMBECTOMY/INFUSION INTRACRANIAL INC DIAG ANGIO  12/01/2023   IR US  GUIDE VASC ACCESS RIGHT  12/01/2023   LEFT AND RIGHT HEART CATHETERIZATION WITH CORONARY ANGIOGRAM N/A 04/02/2014   Procedure: LEFT AND RIGHT HEART CATHETERIZATION WITH CORONARY ANGIOGRAM;  Surgeon: Debby DELENA Burnard, MD;  Location: [facility] CATH LAB;  Service: Cardiovascular;  Laterality: N/A;   MEDIAL COLLATERAL LIGAMENT REPAIR, KNEE Right 08/25/2015   Procedure: RIGHT THUMB RADICAL COLLATERAL LIGAMENT REPAIR;  Surgeon: Donnice Robinsons, MD;  Location: [facility] OR;  Service: Orthopedics;  Laterality: Right;   PERCUTANEOUS CORONARY STENT INTERVENTION (PCI-S) N/A 04/03/2014   Procedure: PERCUTANEOUS CORONARY STENT INTERVENTION (PCI-S);  Surgeon: Peter M Jordan, MD;  Location[facility] CATH LAB;  Service: Cardiovascular;  Laterality: N/A;   RADIOLOGY WITH ANESTHESIA N/A 12/01/2023   Procedure: RADIOLOGY WITH ANESTHESIA;  Surgeon: Radiologist, Medication, MD;  Location: [facility] OR;  Service: Radiology;  Laterality: N/A;  BETHANY BRADFORD DILATION N/A 09/08/2018   Procedure: SAVORY DILATION;  Surgeon: Rollin Dover, MD;  Location: [facility] ENDOSCOPY;  Service: Endoscopy;  Laterality: N/A;   Patient Active Problem List   Diagnosis Date Noted   ICD (implantable cardioverter-defibrillator) discharge 03/23/2024   Hyperlipidemia 01/02/2024   CKD stage 3a, GFR 45-59 ml/min (HCC) 01/02/2024   Depression with anxiety 12/27/2023   Chronic systolic congestive heart failure (HCC) 12/23/2023   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   Acute ischemic right MCA  stroke (HCC) 12/01/2023   Schizophrenia, unspecified (HCC) 03/07/2023   Recurrent major depression 03/07/2023   Pseudophakia of left eye 03/07/2023   Acid reflux 03/07/2023   Dyspepsia 03/07/2023   Anxiety state 03/07/2023   Cortical age-related cataract of right eye 03/07/2023   Psychotic disorder (HCC) 03/07/2023   CAD (coronary artery disease) 03/07/2023   Abdominal mass 07/12/2021   Peripheral  vascular disease 02/04/2021   Overweight 01/21/2020   Dysphagia 12/04/2018   Hiatal hernia 04/19/2018   Gastroesophageal reflux disease 04/03/2018   Ischemic congestive cardiomyopathy (HCC) 10/17/2017   Generalized anxiety disorder 10/17/2017   Prediabetes 10/16/2017   Acquired thrombocytopenia 10/16/2017   Chronic kidney disease, stage 3 (HCC) 10/16/2017   Primary erectile dysfunction 10/13/2017   Chronic combined systolic and diastolic heart failure (HCC) 12/30/2015   Traumatic rupture of collateral ligament of other finger at metacarpophalangeal and interphalangeal joint, subsequent encounter 10/30/2015   Traumatic rupture of collateral ligament of other finger at metacarpophalangeal and interphalangeal joint, initial encounter 08/12/2015   VT (ventricular tachycardia) (HCC) 12/31/2014   ICD (implantable cardioverter-defibrillator) in place 09/10/2014   At risk for sudden cardiac death September 16, 2014   Systolic heart failure (HCC) 08/09/2014   Coronary artery disease involving native coronary artery of native heart without angina pectoris 04/04/2014   Unstable angina (HCC) 04/03/2014   Cardiomyopathy, ischemic 03/22/2014   History of CVA (cerebrovascular accident) 02/20/2014   Human immunodeficiency virus infection (HCC) 02/20/2014   Essential hypertension 02/20/2014   Hyperlipidemia LDL goal <70 02/20/2014   Depression 02/20/2014   Cerebral infarction (HCC) 08/09/2006    ONSET DATE: 05/15/24 referral    REFERRING DIAG: I63.9 (ICD-10-CM) - Cerebral infarction, unspecified   THERAPY DIAG:  Abnormal posture  Difficulty in walking, not elsewhere classified  Muscle weakness (generalized)  Other abnormalities of gait and mobility  Rationale for Evaluation and Treatment: Rehabilitation  SUBJECTIVE:                                                                                                                                                                                              SUBJECTIVE STATEMENT: Patient arrives to clinic with [place], in [place]. Denies falls. Was tired after last session, but no other issues.  Pt accompanied by: significant other  PERTINENT HISTORY: CAD, GERD, HIV, HTN, ischemic cardiomyopathy, CVA  PAIN:  Are you having pain? No  PRECAUTIONS: Fall and ICD/Pacemaker; L hemi   PATIENT GOALS: to walk and use my hand   OBJECTIVE:  Note: Objective measures were completed at Evaluation unless otherwise noted.  DIAGNOSTIC FINDINGS: 12/01/23 head CT IMPRESSION: 1. Subtle hypoattenuation involving the right caudate  and right lentiform nuclei which may reflect acute infarct. 2. No acute intracranial hemorrhage. 3. Remote infarct involving the right frontal operculum, insula, and superior right temporal lobe. 4. Asymmetric density of the right M1 segment. Recommend correlation with CTA. 5. ASPECTS is 8   GAIT: Findings: Gait Characteristics: step to pattern, decreased arm swing- Left, decreased step length- Right, decreased stance time- Left, decreased stride length, decreased hip/knee flexion- Left, decreased ankle dorsiflexion- Left, circumduction- Left, Left hip hike, trunk rotated posterior- Left, trunk flexed, narrow BOS, and poor foot clearance- Left, Assistive device utilized:Hemi walker, and Level of assistance: CGA and Min A                                                                                                             TREATMENT  NMR:  -scifit hills level 3 x8 mins for neural priming  -initial HEP (see below)  -ambulating to/from scifit (~45ft) with LBQC + CGA   PATIENT EDUCATION: Education details: initial HEP, PM&R phone number for CVA f/u Person educated: Patient and Spouse Education method: Explanation Education comprehension: verbalized understanding and needs further education  HOME EXERCISE PROGRAM: Access Code: [email or web address] URL: [email or web address] Date: 06/06/2024 Prepared by: Delon Pop  Exercises - Supine Bridge  - 1 x daily - 7 x weekly - 3 sets - 10 reps - Supine March with Posterior Pelvic Tilt  - 1 x daily - 7 x weekly - 3 sets - 10 reps - Mini Squat with Counter Support  - 1 x daily - 7 x weekly - 3 sets - 8-10 reps - Standing Knee Flexion with Counter Support  - 1 x daily - 7 x weekly - 3 sets - 6-8 reps - Standing March with Counter Support  - 1 x daily - 7 x weekly - 3 sets - 8-10 reps  GOALS: Goals reviewed with patient? Yes  SHORT TERM GOALS: Target date: 06/22/24  Pt will be independent with initial HEP for improved functional strength and balance  Baseline: to be provided  Goal status: INITIAL  2.  Pt will improve gait speed to >/= .20m/s to demonstrate improved community ambulation  Baseline: .17m/s with HW + CGA Goal status: INITIAL  3.  Pt will improve TUG to </= 70 secs to demonstrated reduced fall risk  Baseline: 98s with HW + MinA Goal status: INITIAL  4.  Pt will improve 5x STS to </= 22 sec to demo improved functional LE strength and balance   Baseline: 30.81 with CGA + R UE Goal status: INITIAL   LONG TERM GOALS: Target date: 07/13/24 (to match cert date)   Pt will be independent with final HEP for improved functional strength and balance  Baseline: to be provided  Goal status: INITIAL  Pt will improve gait speed to >/= .64m/s to demonstrate improved community ambulation  Baseline: .64m/s with HW + CGA Goal status: INITIAL  Pt will improve TUG to </= 50 secs to demonstrated reduced fall risk  Baseline: 98s with HW + MinA Goal status: INITIAL  Pt  will improve 5x STS to </= 15 sec to demo improved functional LE strength and balance   Baseline: 30.81 with CGA + R UE Goal status: INITIAL  ASSESSMENT:  CLINICAL IMPRESSION: Patient seen for skilled PT session with emphasis on initiating HEP for global strengthening and L NMR. Prioritizing quality movement over quantity and minimizing compensatory strategies as possible. L  UE continues to demonstrates increasing levels of tone. PT provided patient with PM&R phone number- unsure if Botox is an appropriate option. Continue POC.    OBJECTIVE IMPAIRMENTS: Abnormal gait, decreased balance, decreased coordination, decreased endurance, decreased knowledge of condition, decreased knowledge of use of DME, decreased ROM, decreased strength, impaired tone, impaired UE functional use, and postural dysfunction.   ACTIVITY LIMITATIONS: carrying, lifting, bending, standing, squatting, stairs, transfers, bed mobility, locomotion level, and caring for others  PARTICIPATION LIMITATIONS: meal prep, cleaning, interpersonal relationship, driving, shopping, community activity, occupation, and yard work  PERSONAL FACTORS: Age, Fitness, Past/current experiences, Time since onset of injury/illness/exacerbation, Transportation, and 3+ comorbidities: see above are also affecting patient's functional outcome.   REHAB POTENTIAL: Fair time since onset  CLINICAL DECISION MAKING: Stable/uncomplicated  EVALUATION COMPLEXITY: Low  PLAN:  PT FREQUENCY: 2x/week  PT DURATION: other: 7 weeks  PLANNED INTERVENTIONS: [id number]- PT Re-evaluation, 97750- Physical Performance Testing, 97110-Therapeutic exercises, 97530- Therapeutic activity, [id number]- Neuromuscular re-education, 97535- Self Care, [id number]- Manual therapy, [id number]- Gait training, [phone]- Orthotic Initial, [phone]- Orthotic/Prosthetic subsequent, [phone]- Aquatic Therapy, [phone] (1-2 muscles), [id number] (3+ muscles)- Dry Needling, Patient/Family education, Balance training, Stair training, Taping, Vestibular training, Visual/preceptual remediation/compensation, DME instructions, and Wheelchair mobility training  PLAN FOR NEXT SESSION: HIGT! [facility] working with [facility] Continue use of L LE, weight shifting in bars with toe taps, improve L hip flexion strength.   Delon DELENA Pop, PT Delon DELENA Pop, PT, DPT, CBIS  06/06/2024, 9:33 AM

## 2024-06-11 ENCOUNTER — Ambulatory Visit: Attending: Internal Medicine

## 2024-06-11 VITALS — BP 106/63 | HR 54

## 2024-06-11 DIAGNOSIS — R2689 Other abnormalities of gait and mobility: Secondary | ICD-10-CM | POA: Diagnosis present

## 2024-06-11 DIAGNOSIS — R293 Abnormal posture: Secondary | ICD-10-CM | POA: Diagnosis present

## 2024-06-11 DIAGNOSIS — R262 Difficulty in walking, not elsewhere classified: Secondary | ICD-10-CM | POA: Diagnosis present

## 2024-06-11 DIAGNOSIS — M6281 Muscle weakness (generalized): Secondary | ICD-10-CM | POA: Diagnosis present

## 2024-06-11 NOTE — Therapy (Signed)
 OUTPATIENT PHYSICAL THERAPY NEURO TREATMENT   Patient Name: Richard [name] MRN: [id number] DOB:01/30/1956, 68 y.o., male Today's Date: 06/11/2024   PCP: Ezekiel Charleston, MD REFERRING PROVIDER: Wynne Flatten, MD  END OF SESSION:  PT End of Session - 06/11/24 0938     Visit Number 6    Number of Visits 15    Date for Recertification  07/13/24    Authorization Type [place]    Authorization Time Period 05/13/24-09/10/24    Authorization - Number of Visits 15    Progress Note Due on Visit 10    PT Start Time 0846    PT Stop Time 0930    PT Time Calculation (min) 44 min    Equipment Utilized During Treatment Gait belt    Activity Tolerance Patient tolerated treatment well    Behavior During Therapy WFL for tasks assessed/performed            Past Medical History:  Diagnosis Date   Coronary artery disease    a. cath 04/02/2014 3v dx 80-90% in D1, 80-90% mid LAD, 95% apical LAD, 95% distal inferior LV branch of LCx, total occlusion of prox RCA  b). Cath 04/03/2014 DES to mid LAD, DES x3 to prox D1, DAPT indefinitely. LCx lesion untreated   GERD (gastroesophageal reflux disease)    HIV (human immunodeficiency virus infection) (HCC)    Hypertension    Ischemic cardiomyopathy 08/09/2013   EF 20-25 percent by echo in 04/2014, s/p [employer] model [id number] serial number [id number]   Stroke [facility])    Past Surgical History:  Procedure Laterality Date   CARDIAC CATHETERIZATION  04/02/2014   DR BURNARD   ESOPHAGOGASTRODUODENOSCOPY (EGD) WITH PROPOFOL  N/A 09/08/2018   Procedure: ESOPHAGOGASTRODUODENOSCOPY (EGD) WITH PROPOFOL ;  Surgeon: Rollin Dover, MD;  Location: [facility] ENDOSCOPY;  Service: Endoscopy;  Laterality: N/A;   IMPLANTABLE CARDIOVERTER DEFIBRILLATOR IMPLANT N/A 09/10/2014   Procedure: IMPLANTABLE CARDIOVERTER DEFIBRILLATOR IMPLANT;  Surgeon: Jerel Balding, MD;  [facility] model [id number] serial number [id number]   IR CT HEAD LTD  12/01/2023   IR PERCUTANEOUS ART  THROMBECTOMY/INFUSION INTRACRANIAL INC DIAG ANGIO  12/01/2023   IR US  GUIDE VASC ACCESS RIGHT  12/01/2023   LEFT AND RIGHT HEART CATHETERIZATION WITH CORONARY ANGIOGRAM N/A 04/02/2014   Procedure: LEFT AND RIGHT HEART CATHETERIZATION WITH CORONARY ANGIOGRAM;  Surgeon: Debby DELENA Burnard, MD;  Location[facility] C[facility] LAB;  Service: Cardiovascular;  Laterality: N/A;   MEDIAL COLLATERAL LIGAMENT REPAIR, KNEE Right 08/25/2015   Procedure: RIGHT THUMB RADICAL COLLATERAL LIGAMENT REPAIR;  Surgeon: Donnice Robinsons, MD;  Location: [facility] OR;  Service: Orthopedics;  Laterality: Right;   PERCUTANEOUS CORONARY STENT INTERVENTION (PCI-S) N/A 04/03/2014   Procedure: PERCUTANEOUS CORONARY STENT INTERVENTION (PCI-S);  Surgeon: Peter M Jordan, MD;  Location: [facility] CATH LAB;  Service: Cardiovascular;  Laterality: N/A;   RADIOLOGY WITH ANESTHESIA N/A 12/01/2023   Procedure: RADIOLOGY WITH ANESTHESIA;  Surgeon: Radiologist, Medication, MD;  Location: [facility] OR;  Service: Radiology;  Laterality: N/A;  BETHANY BRADFORD DILATION N/A 09/08/2018   Procedure: SAVORY DILATION;  Surgeon: Rollin Dover, MD;  Location: [facility] ENDOSCOPY;  Service: Endoscopy;  Laterality: N/A;   Patient Active Problem List   Diagnosis Date Noted   ICD (implantable cardioverter-defibrillator) discharge 03/23/2024   Hyperlipidemia 01/02/2024   CKD stage 3a, GFR 45-59 ml/min (HCC) 01/02/2024   Depression with anxiety 12/27/2023   Chronic systolic congestive heart failure (HCC) 12/23/2023   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   Acute ischemic right  MCA stroke (HCC) 12/01/2023   Schizophrenia, unspecified (HCC) 03/07/2023   Recurrent major depression 03/07/2023   Pseudophakia of left eye 03/07/2023   Acid reflux 03/07/2023   Dyspepsia 03/07/2023   Anxiety state 03/07/2023   Cortical age-related cataract of right eye 03/07/2023   Psychotic disorder (HCC) 03/07/2023   CAD (coronary artery disease) 03/07/2023   Abdominal mass 07/12/2021   Peripheral  vascular disease 02/04/2021   Overweight 01/21/2020   Dysphagia 12/04/2018   Hiatal hernia 04/19/2018   Gastroesophageal reflux disease 04/03/2018   Ischemic congestive cardiomyopathy (HCC) 10/17/2017   Generalized anxiety disorder 10/17/2017   Prediabetes 10/16/2017   Acquired thrombocytopenia 10/16/2017   Chronic kidney disease, stage 3 (HCC) 10/16/2017   Primary erectile dysfunction 10/13/2017   Chronic combined systolic and diastolic heart failure (HCC) 12/30/2015   Traumatic rupture of collateral ligament of other finger at metacarpophalangeal and interphalangeal joint, subsequent encounter 10/30/2015   Traumatic rupture of collateral ligament of other finger at metacarpophalangeal and interphalangeal joint, initial encounter 08/12/2015   VT (ventricular tachycardia) (HCC) 12/31/2014   ICD (implantable cardioverter-defibrillator) in place 09/10/2014   At risk for sudden cardiac death 09-11-14   Systolic heart failure (HCC) 08/09/2014   Coronary artery disease involving native coronary artery of native heart without angina pectoris 04/04/2014   Unstable angina (HCC) 04/03/2014   Cardiomyopathy, ischemic 03/22/2014   History of CVA (cerebrovascular accident) 02/20/2014   Human immunodeficiency virus infection (HCC) 02/20/2014   Essential hypertension 02/20/2014   Hyperlipidemia LDL goal <70 02/20/2014   Depression 02/20/2014   Cerebral infarction (HCC) 08/09/2006    ONSET DATE: 05/15/24 referral    REFERRING DIAG: I63.9 (ICD-10-CM) - Cerebral infarction, unspecified   THERAPY DIAG:  Abnormal posture  Difficulty in walking, not elsewhere classified  Muscle weakness (generalized)  Rationale for Evaluation and Treatment: Rehabilitation  SUBJECTIVE:                                                                                                                                                                                             SUBJECTIVE STATEMENT:  Patient  presents to clinic with wife in [facility] Has been completing HEP and sometimes his back hurts because he states doing a lot of repetitions of HEP at once without rest. His left shoulder hurts a little more today and usually sleeps on his left side so that bothers it. Denies falls. Pt accompanied by: significant other  PERTINENT HISTORY: CAD, GERD, HIV, HTN, ischemic cardiomyopathy, CVA  PAIN:  Are you having pain? No  PRECAUTIONS: Fall and ICD/Pacemaker; L hemi   PATIENT GOALS: to walk and use my hand  OBJECTIVE:  Note: Objective measures were completed at Evaluation unless otherwise noted.  DIAGNOSTIC FINDINGS: 12/01/23 head CT IMPRESSION: 1. Subtle hypoattenuation involving the right caudate and right lentiform nuclei which may reflect acute infarct. 2. No acute intracranial hemorrhage. 3. Remote infarct involving the right frontal operculum, insula, and superior right temporal lobe. 4. Asymmetric density of the right M1 segment. Recommend correlation with CTA. 5. ASPECTS is 8   GAIT: Findings: Gait Characteristics: step to pattern, decreased arm swing- Left, decreased step length- Right, decreased stance time- Left, decreased stride length, decreased hip/knee flexion- Left, decreased ankle dorsiflexion- Left, circumduction- Left, Left hip hike, trunk rotated posterior- Left, trunk flexed, narrow BOS, and poor foot clearance- Left, Assistive device utilized:Hemi walker, and Level of assistance: CGA and Min A                                                                                                             TREATMENT   Gait training, cueing for hip extension, hip flexion, knee flexion and chest positioning:  - Gait 85 feet with 4 pound ankle weight, LBQC, CGA and RPE 7/10 at end *Seated rest after ankle weighted gait training due to fatigue - Gait 25 feet x 2, LBQC, CGA  NMR, cueing for weight shifting through L LE, weight bearing through L UE, hip and knee flexion with  toe taps: In parallel bars: - Weight shifting from L to R UE and LE x 12 total shifts - Toe taps to bubble x 10 each side with weight shifting in each UE and LE *R UE was removed half way through toe taps due to discomfort  *Rest needed throughout due to fatigue  PATIENT EDUCATION: Education details: verbally reviewed HEP, taking breaks between eat set for HEP, discussed needed referral to PM&R and OT Person educated: Patient and Spouse Education method: Explanation Education comprehension: verbalized understanding and needs further education  HOME EXERCISE PROGRAM: Access Code: [email or web address] URL: [email or web address] Date: 06/06/2024 Prepared by: Delon Pop  Exercises - Supine Bridge  - 1 x daily - 7 x weekly - 3 sets - 10 reps - Supine March with Posterior Pelvic Tilt  - 1 x daily - 7 x weekly - 3 sets - 10 reps - Mini Squat with Counter Support  - 1 x daily - 7 x weekly - 3 sets - 8-10 reps - Standing Knee Flexion with Counter Support  - 1 x daily - 7 x weekly - 3 sets - 6-8 reps - Standing March with Counter Support  - 1 x daily - 7 x weekly - 3 sets - 8-10 reps  GOALS: Goals reviewed with patient? Yes  SHORT TERM GOALS: Target date: 06/22/24  Pt will be independent with initial HEP for improved functional strength and balance  Baseline: to be provided  Goal status: MET  2.  Pt will improve gait speed to >/= .18m/s to demonstrate improved community ambulation  Baseline: .46m/s with HW + CGA Goal status: INITIAL  3.  Pt will improve TUG to </= 70 secs to  demonstrated reduced fall risk  Baseline: 98s with HW + MinA Goal status: INITIAL  4.  Pt will improve 5x STS to </= 22 sec to demo improved functional LE strength and balance   Baseline: 30.81 with CGA + R UE Goal status: INITIAL   LONG TERM GOALS: Target date: 07/13/24 (to match cert date)   Pt will be independent with final HEP for improved functional strength and balance  Baseline: to be  provided  Goal status: INITIAL  Pt will improve gait speed to >/= .68m/s to demonstrate improved community ambulation  Baseline: .2m/s with HW + CGA Goal status: INITIAL  Pt will improve TUG to </= 50 secs to demonstrated reduced fall risk  Baseline: 98s with HW + MinA Goal status: INITIAL  Pt will improve 5x STS to </= 15 sec to demo improved functional LE strength and balance   Baseline: 30.81 with CGA + R UE Goal status: INITIAL  ASSESSMENT:  CLINICAL IMPRESSION: Patient seen for skilled PT session with emphasis on higher intensity gait training and weight shifting through upper and lower extremities. Ankle weighted gait training was well tolerated with increased fatigue at end of ambulation and cueing needed throughout. Recovery time was good with seated recovery needed after ambulation. Weight shifting and toe taps in parallel bars were helpful in weight bearing throughout both LUE and LLE. L UE was less tolerated with weight bearing and should continued as tolerated. Patient still demonstrates increased levels of tone. Continue POC.    OBJECTIVE IMPAIRMENTS: Abnormal gait, decreased balance, decreased coordination, decreased endurance, decreased knowledge of condition, decreased knowledge of use of DME, decreased ROM, decreased strength, impaired tone, impaired UE functional use, and postural dysfunction.   ACTIVITY LIMITATIONS: carrying, lifting, bending, standing, squatting, stairs, transfers, bed mobility, locomotion level, and caring for others  PARTICIPATION LIMITATIONS: meal prep, cleaning, interpersonal relationship, driving, shopping, community activity, occupation, and yard work  PERSONAL FACTORS: Age, Fitness, Past/current experiences, Time since onset of injury/illness/exacerbation, Transportation, and 3+ comorbidities: see above are also affecting patient's functional outcome.   REHAB POTENTIAL: Fair time since onset  CLINICAL DECISION MAKING:  Stable/uncomplicated  EVALUATION COMPLEXITY: Low  PLAN:  PT FREQUENCY: 2x/week  PT DURATION: other: 7 weeks  PLANNED INTERVENTIONS: [id number]- PT Re-evaluation, 97750- Physical Performance Testing, 97110-Therapeutic exercises, 97530- Therapeutic activity, [id number]- Neuromuscular re-education, 97535- Self Care, [id number]- Manual therapy, [id number]- Gait training, [phone]- Orthotic Initial, [phone]- Orthotic/Prosthetic subsequent, [phone]- Aquatic Therapy, [phone] (1-2 muscles), [id number] (3+ muscles)- Dry Needling, Patient/Family education, Balance training, Stair training, Taping, Vestibular training, Visual/preceptual remediation/compensation, DME instructions, and Wheelchair mobility training  PLAN FOR NEXT SESSION: HIGT! Continue working with [facility] Continue use of L LE, weight shifting in bars with toe taps, improve L hip flexion strength. Progress toe taps to over object and progress to weighted as tolerated. Weighted dowel sit to stand with LUE use, weight bearing throughout LUE   Emmalene Sherry, Student-PT  06/11/2024, 10:46 AM

## 2024-06-12 ENCOUNTER — Encounter: Payer: Self-pay | Admitting: Registered Nurse

## 2024-06-13 ENCOUNTER — Ambulatory Visit

## 2024-06-13 VITALS — BP 97/59 | HR 55

## 2024-06-13 DIAGNOSIS — R293 Abnormal posture: Secondary | ICD-10-CM

## 2024-06-13 DIAGNOSIS — M6281 Muscle weakness (generalized): Secondary | ICD-10-CM

## 2024-06-13 DIAGNOSIS — R262 Difficulty in walking, not elsewhere classified: Secondary | ICD-10-CM

## 2024-06-13 NOTE — Therapy (Signed)
 OUTPATIENT PHYSICAL THERAPY NEURO TREATMENT   Patient Name: GIANLUCAS EVENSON MRN: [id number] DOB:1956/04/05, 68 y.o., male Today's Date: 06/13/2024   PCP: Ezekiel Charleston, MD REFERRING PROVIDER: Wynne Flatten, MD  END OF SESSION:  PT End of Session - 06/13/24 0949     Visit Number 7    Number of Visits 15    Date for Recertification  07/13/24    Authorization Type [facility]    Authorization Time Period 05/13/24-09/10/24    Authorization - Number of Visits 15    Progress Note Due on Visit 10    PT Start Time 0846    PT Stop Time 0932    PT Time Calculation (min) 46 min    Equipment Utilized During Treatment Gait belt    Activity Tolerance Patient tolerated treatment well    Behavior During Therapy WFL for tasks assessed/performed             Past Medical History:  Diagnosis Date   Coronary artery disease    a. cath 04/02/2014 3v dx 80-90% in D1, 80-90% mid LAD, 95% apical LAD, 95% distal inferior LV branch of LCx, total occlusion of prox RCA  b). Cath 04/03/2014 DES to mid LAD, DES x3 to prox D1, DAPT indefinitely. LCx lesion untreated   GERD (gastroesophageal reflux disease)    HIV (human immunodeficiency virus infection) (HCC)    Hypertension    Ischemic cardiomyopathy 08/09/2013   EF 20-25 percent by echo in 04/2014, s/p [employer] model [id number] serial number [id number]   Stroke [facility])    Past Surgical History:  Procedure Laterality Date   CARDIAC CATHETERIZATION  04/02/2014   DR BURNARD   ESOPHAGOGASTRODUODENOSCOPY (EGD) WITH PROPOFOL  N/A 09/08/2018   Procedure: ESOPHAGOGASTRODUODENOSCOPY (EGD) WITH PROPOFOL ;  Surgeon: Rollin Dover, MD;  Location: [facility] ENDOSCOPY;  Service: Endoscopy;  Laterality: N/A;   IMPLANTABLE CARDIOVERTER DEFIBRILLATOR IMPLANT N/A 09/10/2014   Procedure: IMPLANTABLE CARDIOVERTER DEFIBRILLATOR IMPLANT;  Surgeon: Jerel Balding, MD;  [facility] model [phone] serial number [id number]   IR CT HEAD LTD  12/01/2023   IR PERCUTANEOUS ART  THROMBECTOMY/INFUSION INTRACRANIAL INC DIAG ANGIO  12/01/2023   IR US  GUIDE VASC ACCESS RIGHT  12/01/2023   LEFT AND RIGHT HEART CATHETERIZATION WITH CORONARY ANGIOGRAM N/A 04/02/2014   Procedure: LEFT AND RIGHT HEART CATHETERIZATION WITH CORONARY ANGIOGRAM;  Surgeon: Debby DELENA Burnard, MD;  Location: [facility] CATH LAB;  Service: Cardiovascular;  Laterality: N/A;   MEDIAL COLLATERAL LIGAMENT REPAIR, KNEE Right 08/25/2015   Procedure: RIGHT THUMB RADICAL COLLATERAL LIGAMENT REPAIR;  Surgeon: Donnice Robinsons, MD;  Location: [facility] OR;  Service: Orthopedics;  Laterality: Right;   PERCUTANEOUS CORONARY STENT INTERVENTION (PCI-S) N/A 04/03/2014   Procedure: PERCUTANEOUS CORONARY STENT INTERVENTION (PCI-S);  Surgeon: Peter M Jordan, MD;  Location: [facility] CATH LAB;  Service: Cardiovascular;  Laterality: N/A;   RADIOLOGY WITH ANESTHESIA N/A 12/01/2023   Procedure: RADIOLOGY WITH ANESTHESIA;  Surgeon: Radiologist, Medication, MD;  Location: [facility] OR;  Service: Radiology;  Laterality: N/A;  BETHANY BRADFORD DILATION N/A 09/08/2018   Procedure: SAVORY DILATION;  Surgeon: Rollin Dover, MD;  Location: [facility] ENDOSCOPY;  Service: Endoscopy;  Laterality: N/A;   Patient Active Problem List   Diagnosis Date Noted   ICD (implantable cardioverter-defibrillator) discharge 03/23/2024   Hyperlipidemia 01/02/2024   CKD stage 3a, GFR 45-59 ml/min (HCC) 01/02/2024   Depression with anxiety 12/27/2023   Chronic systolic congestive heart failure (HCC) 12/23/2023   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   Acute ischemic  right MCA stroke (HCC) 12/01/2023   Schizophrenia, unspecified (HCC) 03/07/2023   Recurrent major depression 03/07/2023   Pseudophakia of left eye 03/07/2023   Acid reflux 03/07/2023   Dyspepsia 03/07/2023   Anxiety state 03/07/2023   Cortical age-related cataract of right eye 03/07/2023   Psychotic disorder (HCC) 03/07/2023   CAD (coronary artery disease) 03/07/2023   Abdominal mass 07/12/2021   Peripheral  vascular disease 02/04/2021   Overweight 01/21/2020   Dysphagia 12/04/2018   Hiatal hernia 04/19/2018   Gastroesophageal reflux disease 04/03/2018   Ischemic congestive cardiomyopathy (HCC) 10/17/2017   Generalized anxiety disorder 10/17/2017   Prediabetes 10/16/2017   Acquired thrombocytopenia 10/16/2017   Chronic kidney disease, stage 3 (HCC) 10/16/2017   Primary erectile dysfunction 10/13/2017   Chronic combined systolic and diastolic heart failure (HCC) 12/30/2015   Traumatic rupture of collateral ligament of other finger at metacarpophalangeal and interphalangeal joint, subsequent encounter 10/30/2015   Traumatic rupture of collateral ligament of other finger at metacarpophalangeal and interphalangeal joint, initial encounter 08/12/2015   VT (ventricular tachycardia) (HCC) 12/31/2014   ICD (implantable cardioverter-defibrillator) in place 09/10/2014   At risk for sudden cardiac death 08-31-14   Systolic heart failure (HCC) 08/09/2014   Coronary artery disease involving native coronary artery of native heart without angina pectoris 04/04/2014   Unstable angina (HCC) 04/03/2014   Cardiomyopathy, ischemic 03/22/2014   History of CVA (cerebrovascular accident) 02/20/2014   Human immunodeficiency virus infection (HCC) 02/20/2014   Essential hypertension 02/20/2014   Hyperlipidemia LDL goal <70 02/20/2014   Depression 02/20/2014   Cerebral infarction (HCC) 08/09/2006    ONSET DATE: 05/15/24 referral    REFERRING DIAG: I63.9 (ICD-10-CM) - Cerebral infarction, unspecified   THERAPY DIAG:  Abnormal posture  Difficulty in walking, not elsewhere classified  Muscle weakness (generalized)  Rationale for Evaluation and Treatment: Rehabilitation  SUBJECTIVE:                                                                                                                                                                                             SUBJECTIVE STATEMENT:  Patient  entered clinic in [facility] and wife accompanying. Patient states he had a near fall, fell into chair when backing up to sit down last night.  Pt accompanied by: significant other  PERTINENT HISTORY: CAD, GERD, HIV, HTN, ischemic cardiomyopathy, CVA  PAIN:  Are you having pain? No  PRECAUTIONS: Fall and ICD/Pacemaker; L hemi   PATIENT GOALS: to walk and use my hand   OBJECTIVE:  Note: Objective measures were completed at Evaluation unless otherwise noted.  DIAGNOSTIC FINDINGS: 12/01/23 head CT IMPRESSION: 1. Subtle hypoattenuation involving  the right caudate and right lentiform nuclei which may reflect acute infarct. 2. No acute intracranial hemorrhage. 3. Remote infarct involving the right frontal operculum, insula, and superior right temporal lobe. 4. Asymmetric density of the right M1 segment. Recommend correlation with CTA. 5. ASPECTS is 8   GAIT: Findings: Gait Characteristics: step to pattern, decreased arm swing- Left, decreased step length- Right, decreased stance time- Left, decreased stride length, decreased hip/knee flexion- Left, decreased ankle dorsiflexion- Left, circumduction- Left, Left hip hike, trunk rotated posterior- Left, trunk flexed, narrow BOS, and poor foot clearance- Left, Assistive device utilized:Hemi walker, and Level of assistance: CGA and Min A                                                                                                             TREATMENT   Therapeutic Activity: Education of safe transferring, especially with feeling stronger and more confident, need to continue ambulating and transferring safely  Ambulation: cueing for hip extension, hip flexion, knee flexion and chest positioning:  - Gait x 115 ft with 1 seated break half way due to fatigue LBQC, CGA, tactile cueing needed for hip extension   NMR:  Powderboard with mirror, L LE, blocked practice of gait cycle with segmented hip and knee flexion into knee and hip extension,  verbal, demonstration and tactile cues/taps to muscle belly of L hamstrings and quads according to position of gait cycle x appr.10 reps  Powderboard with mirror, L LE, progressed to full gait cycle, verbal, demonstration and tactile cues/taps to muscle belly of L hamstrings and quads according to position of gait cycle x appr.10 reps  Powderboard with mirror, L LE, progressed to manual resistance in full gait cycle, verbal, demonstration and tactile cues/taps to muscle belly of L hamstrings and quads according to position of gait cycle x appr.10 reps  *Rest needed throughout due to fatigue  PATIENT EDUCATION: Education details: Educated/reviewed safe transferring and gait at home Person educated: Patient and Spouse Education method: Explanation Education comprehension: verbalized understanding and needs further education  HOME EXERCISE PROGRAM: Access Code: [email or web address] URL: [email or web address] Date: 06/06/2024 Prepared by: Delon Pop  Exercises - Supine Bridge  - 1 x daily - 7 x weekly - 3 sets - 10 reps - Supine March with Posterior Pelvic Tilt  - 1 x daily - 7 x weekly - 3 sets - 10 reps - Mini Squat with Counter Support  - 1 x daily - 7 x weekly - 3 sets - 8-10 reps - Standing Knee Flexion with Counter Support  - 1 x daily - 7 x weekly - 3 sets - 6-8 reps - Standing March with Counter Support  - 1 x daily - 7 x weekly - 3 sets - 8-10 reps  GOALS: Goals reviewed with patient? Yes  SHORT TERM GOALS: Target date: 06/22/24  Pt will be independent with initial HEP for improved functional strength and balance  Baseline: to be provided  Goal status: MET  2.  Pt will improve gait speed to >/= .66m/s to demonstrate improved  community ambulation  Baseline: .72m/s with HW + CGA Goal status: INITIAL  3.  Pt will improve TUG to </= 70 secs to demonstrated reduced fall risk  Baseline: 98s with HW + MinA Goal status: INITIAL  4.  Pt will improve 5x STS to </= 22 sec  to demo improved functional LE strength and balance   Baseline: 30.81 with CGA + R UE Goal status: INITIAL   LONG TERM GOALS: Target date: 07/13/24 (to match cert date)   Pt will be independent with final HEP for improved functional strength and balance  Baseline: to be provided  Goal status: INITIAL  Pt will improve gait speed to >/= .97m/s to demonstrate improved community ambulation  Baseline: .97m/s with HW + CGA Goal status: INITIAL  Pt will improve TUG to </= 50 secs to demonstrated reduced fall risk  Baseline: 98s with HW + MinA Goal status: INITIAL  Pt will improve 5x STS to </= 15 sec to demo improved functional LE strength and balance   Baseline: 30.81 with CGA + R UE Goal status: INITIAL  ASSESSMENT:  CLINICAL IMPRESSION: Patient seen for skilled PT session with emphasis on NMR with use of powderboard and block practice for improved reciprocal patterning in gait. Treatment was well tolerated with noted fatigue after powderboard and needed rest break during 167ft ambulation. Verbal, demonstration and tactile cues/taps to muscle belly of L hamstrings and quads according to position of gait cycle was used to facilitate proper musculature activation during powderboard. Recovery time was good with seated recovery needed during all tasks today. Patient still demonstrates increased levels of tone. Continue POC.    OBJECTIVE IMPAIRMENTS: Abnormal gait, decreased balance, decreased coordination, decreased endurance, decreased knowledge of condition, decreased knowledge of use of DME, decreased ROM, decreased strength, impaired tone, impaired UE functional use, and postural dysfunction.   ACTIVITY LIMITATIONS: carrying, lifting, bending, standing, squatting, stairs, transfers, bed mobility, locomotion level, and caring for others  PARTICIPATION LIMITATIONS: meal prep, cleaning, interpersonal relationship, driving, shopping, community activity, occupation, and yard work  PERSONAL  FACTORS: Age, Fitness, Past/current experiences, Time since onset of injury/illness/exacerbation, Transportation, and 3+ comorbidities: see above are also affecting patient's functional outcome.   REHAB POTENTIAL: Fair time since onset  CLINICAL DECISION MAKING: Stable/uncomplicated  EVALUATION COMPLEXITY: Low  PLAN:  PT FREQUENCY: 2x/week  PT DURATION: other: 7 weeks  PLANNED INTERVENTIONS: 97164- PT Re-evaluation, 97750- Physical Performance Testing, 97110-Therapeutic exercises, 97530- Therapeutic activity, [id number]- Neuromuscular re-education, 97535- Self Care, [id number]- Manual therapy, [id number]- Gait training, [phone]- Orthotic Initial, [phone]- Orthotic/Prosthetic subsequent, [phone]- Aquatic Therapy, [phone] (1-2 muscles), [id number] (3+ muscles)- Dry Needling, Patient/Family education, Balance training, Stair training, Taping, Vestibular training, Visual/preceptual remediation/compensation, DME instructions, and Wheelchair mobility training  PLAN FOR NEXT SESSION: HIGT! Continue working with [facility] Continue use of L LE, weight shifting in bars with toe taps, improve L hip flexion strength. Progress toe taps to over object and progress to weighted as tolerated. Weighted dowel sit to stand with LUE use, weight bearing throughout LUE   Emmalene Sherry, Student-PT  06/13/2024, 11:50 AM

## 2024-06-18 ENCOUNTER — Ambulatory Visit

## 2024-06-18 VITALS — BP 107/68 | HR 53

## 2024-06-18 DIAGNOSIS — R293 Abnormal posture: Secondary | ICD-10-CM

## 2024-06-18 DIAGNOSIS — R262 Difficulty in walking, not elsewhere classified: Secondary | ICD-10-CM

## 2024-06-18 DIAGNOSIS — M6281 Muscle weakness (generalized): Secondary | ICD-10-CM

## 2024-06-18 NOTE — Therapy (Signed)
 OUTPATIENT PHYSICAL THERAPY NEURO TREATMENT   Patient Name: Richard [name] MRN: [id number] DOB:1955/11/27, 68 y.o., male Today's Date: 06/18/2024   PCP: Ezekiel Charleston, MD REFERRING PROVIDER: Wynne Flatten, MD  END OF SESSION:  PT End of Session - 06/18/24 0841     Visit Number 8    Number of Visits 15    Date for Recertification  07/13/24    Authorization Type [place]    Authorization Time Period 05/13/24-09/10/24    Authorization - Number of Visits 15    Progress Note Due on Visit 10    PT Start Time 0845    PT Stop Time 0929    PT Time Calculation (min) 44 min    Equipment Utilized During Treatment Gait belt    Activity Tolerance Patient tolerated treatment well    Behavior During Therapy WFL for tasks assessed/performed              Past Medical History:  Diagnosis Date   Coronary artery disease    a. cath 04/02/2014 3v dx 80-90% in D1, 80-90% mid LAD, 95% apical LAD, 95% distal inferior LV branch of LCx, total occlusion of prox RCA  b). Cath 04/03/2014 DES to mid LAD, DES x3 to prox D1, DAPT indefinitely. LCx lesion untreated   GERD (gastroesophageal reflux disease)    HIV (human immunodeficiency virus infection) (HCC)    Hypertension    Ischemic cardiomyopathy 08/09/2013   EF 20-25 percent by echo in 04/2014, s/p [employer] [place] model [id number] serial number [id number]   Stroke [facility])    Past Surgical History:  Procedure Laterality Date   CARDIAC CATHETERIZATION  04/02/2014   DR BURNARD   ESOPHAGOGASTRODUODENOSCOPY (EGD) WITH PROPOFOL  N/A 09/08/2018   Procedure: ESOPHAGOGASTRODUODENOSCOPY (EGD) WITH PROPOFOL ;  Surgeon: Rollin Dover, MD;  Location: [facility] ENDOSCOPY;  Service: Endoscopy;  Laterality: N/A;   IMPLANTABLE CARDIOVERTER DEFIBRILLATOR IMPLANT N/A 09/10/2014   Procedure: IMPLANTABLE CARDIOVERTER DEFIBRILLATOR IMPLANT;  Surgeon: Jerel Balding, MD;  [facility] model [id number] serial number [id number]   IR CT HEAD LTD  12/01/2023   IR PERCUTANEOUS  ART THROMBECTOMY/INFUSION INTRACRANIAL INC DIAG ANGIO  12/01/2023   IR US  GUIDE VASC ACCESS RIGHT  12/01/2023   LEFT AND RIGHT HEART CATHETERIZATION WITH CORONARY ANGIOGRAM N/A 04/02/2014   Procedure: LEFT AND RIGHT HEART CATHETERIZATION WITH CORONARY ANGIOGRAM;  Surgeon: Debby DELENA Burnard, MD;  Location: [facility] CATH LAB;  Service: Cardiovascular;  Laterality: N/A;   MEDIAL COLLATERAL LIGAMENT REPAIR, KNEE Right 08/25/2015   Procedure: RIGHT THUMB RADICAL COLLATERAL LIGAMENT REPAIR;  Surgeon: Donnice Robinsons, MD;  Location: [facility] OR;  Service: Orthopedics;  Laterality: Right;   PERCUTANEOUS CORONARY STENT INTERVENTION (PCI-S) N/A 04/03/2014   Procedure: PERCUTANEOUS CORONARY STENT INTERVENTION (PCI-S);  Surgeon: Peter M Jordan, MD;  Location: [facility] CATH LAB;  Service: Cardiovascular;  Laterality: N/A;   RADIOLOGY WITH ANESTHESIA N/A 12/01/2023   Procedure: RADIOLOGY WITH ANESTHESIA;  Surgeon: Radiologist, Medication, MD;  Location: [facility] OR;  Service: Radiology;  Laterality: N/A;  BETHANY BRADFORD DILATION N/A 09/08/2018   Procedure: SAVORY DILATION;  Surgeon: Rollin Dover, MD;  Location: [facility] ENDOS[facility];  Service: Endoscopy;  Laterality: N/A;   Patient Active Problem List   Diagnosis Date Noted   ICD (implantable cardioverter-defibrillator) discharge 03/23/2024   Hyperlipidemia 01/02/2024   CKD stage 3a, GFR 45-59 ml/min (HCC) 01/02/2024   Depression with anxiety 12/27/2023   Chronic systolic congestive heart failure (HCC) 12/23/2023   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   Acute  ischemic right MCA stroke (HCC) 12/01/2023   Schizophrenia, unspecified (HCC) 03/07/2023   Recurrent major depression 03/07/2023   Pseudophakia of left eye 03/07/2023   Acid reflux 03/07/2023   Dyspepsia 03/07/2023   Anxiety state 03/07/2023   Cortical age-related cataract of right eye 03/07/2023   Psychotic disorder (HCC) 03/07/2023   CAD (coronary artery disease) 03/07/2023   Abdominal mass 07/12/2021    Peripheral vascular disease 02/04/2021   Overweight 01/21/2020   Dysphagia 12/04/2018   Hiatal hernia 04/19/2018   Gastroesophageal reflux disease 04/03/2018   Ischemic congestive cardiomyopathy (HCC) 10/17/2017   Generalized anxiety disorder 10/17/2017   Prediabetes 10/16/2017   Acquired thrombocytopenia 10/16/2017   Chronic kidney disease, stage 3 (HCC) 10/16/2017   Primary erectile dysfunction 10/13/2017   Chronic combined systolic and diastolic heart failure (HCC) 12/30/2015   Traumatic rupture of collateral ligament of other finger at metacarpophalangeal and interphalangeal joint, subsequent encounter 10/30/2015   Traumatic rupture of collateral ligament of other finger at metacarpophalangeal and interphalangeal joint, initial encounter 08/12/2015   VT (ventricular tachycardia) (HCC) 12/31/2014   ICD (implantable cardioverter-defibrillator) in place 09/10/2014   At risk for sudden cardiac death Sep 08, 2014   Systolic heart failure (HCC) 08/09/2014   Coronary artery disease involving native coronary artery of native heart without angina pectoris 04/04/2014   Unstable angina (HCC) 04/03/2014   Cardiomyopathy, ischemic 03/22/2014   History of CVA (cerebrovascular accident) 02/20/2014   Human immunodeficiency virus infection (HCC) 02/20/2014   Essential hypertension 02/20/2014   Hyperlipidemia LDL goal <70 02/20/2014   Depression 02/20/2014   Cerebral infarction (HCC) 08/09/2006    ONSET DATE: 05/15/24 referral    REFERRING DIAG: I63.9 (ICD-10-CM) - Cerebral infarction, unspecified   THERAPY DIAG:  Abnormal posture  Difficulty in walking, not elsewhere classified  Muscle weakness (generalized)  Rationale for Evaluation and Treatment: Rehabilitation  SUBJECTIVE:                                                                                                                                                                                             SUBJECTIVE  STATEMENT: Patient enters clinic in wheel chair and wife accompanying him. Patient states that he went to church and uses the wheelchair as the hemiwalker was too exahusting when he tried using it a while back. Patient denies falls. Pt accompanied by: significant other  PERTINENT HISTORY: CAD, GERD, HIV, HTN, ischemic cardiomyopathy, CVA  PAIN:  Are you having pain? No  PRECAUTIONS: Fall and ICD/Pacemaker; L hemi   PATIENT GOALS: to walk and use my hand   OBJECTIVE:  Note: Objective measures were completed at Evaluation unless otherwise noted.  DIAGNOSTIC FINDINGS: 12/01/23 head CT IMPRESSION: 1. Subtle hypoattenuation involving the right caudate and right lentiform nuclei which may reflect acute infarct. 2. No acute intracranial hemorrhage. 3. Remote infarct involving the right frontal operculum, insula, and superior right temporal lobe. 4. Asymmetric density of the right M1 segment. Recommend correlation with CTA. 5. ASPECTS is 8   GAIT: Findings: Gait Characteristics: step to pattern, decreased arm swing- Left, decreased step length- Right, decreased stance time- Left, decreased stride length, decreased hip/knee flexion- Left, decreased ankle dorsiflexion- Left, circumduction- Left, Left hip hike, trunk rotated posterior- Left, trunk flexed, narrow BOS, and poor foot clearance- Left, Assistive device utilized:Hemi walker, and Level of assistance: CGA and Min A                                                                                                             TREATMENT   Gait Training, cueing for hip extension, hip flexion, knee flexion and chest positioning:  - Ambulation with 5 pound ankle weight 115 ft (1 lap), RPE 7 at end of ambulation  - Completed 184ft without break, seated rest break with water after completion - LBQC, CGA, min assist for postural corrections, cues for chest up, no loss of balance   - Ambulation (not weighted) x 50 ft  - No rest break  needed after 50 ft - LBQC, CGA, min assist for postural corrections, cues for chest up, no loss of balance  NMR, emphasis on weight shifting to L LE with eccentric and concentric parts of movement, tactile cueing through L knee for weight shifting:  - Sit to stand x 5 reps, No UE, emphasis on weight shifting to L LE with eccentric and concentric parts of movement, tactile cueing through L knee for weight shifting - Sit to stand, no UE, with mirror for left weight shifting, 1 set x 6 reps - Sit to stand with yellow dowel, help with B UE, with mirror x 10 reps, last 3 reps difficult to hold with L LE but no discomfort present - SciFit, hills, level 3 x 4 minutes for reciprocal patterning for gait and endurance *Rest needed throughout due to fatigue  PATIENT EDUCATION: Education details: Educated on correct completion of HEP at home, LE weight shifting to left Person educated: Patient and Spouse Education method: Explanation Education comprehension: verbalized understanding and needs further education  HOME EXERCISE PROGRAM: Access Code: [email or web address] URL: [email or web address] Date: 06/06/2024 Prepared by: Delon Pop  Exercises - Supine Bridge  - 1 x daily - 7 x weekly - 3 sets - 10 reps - Supine March with Posterior Pelvic Tilt  - 1 x daily - 7 x weekly - 3 sets - 10 reps - Mini Squat with Counter Support  - 1 x daily - 7 x weekly - 3 sets - 8-10 reps - Standing Knee Flexion with Counter Support  - 1 x daily - 7 x weekly - 3 sets - 6-8 reps - Standing March with Counter Support  - 1 x daily - 7 x weekly - 3 sets -  8-10 reps  GOALS: Goals reviewed with patient? Yes  SHORT TERM GOALS: Target date: 06/22/24  Pt will be independent with initial HEP for improved functional strength and balance  Baseline: to be provided  Goal status: MET  2.  Pt will improve gait speed to >/= .54m/s to demonstrate improved community ambulation  Baseline: .7m/s with HW + CGA Goal  status: INITIAL  3.  Pt will improve TUG to </= 70 secs to demonstrated reduced fall risk  Baseline: 98s with HW + MinA Goal status: INITIAL  4.  Pt will improve 5x STS to </= 22 sec to demo improved functional LE strength and balance   Baseline: 30.81 with CGA + R UE Goal status: INITIAL   LONG TERM GOALS: Target date: 07/13/24 (to match cert date)   Pt will be independent with final HEP for improved functional strength and balance  Baseline: to be provided  Goal status: INITIAL  Pt will improve gait speed to >/= .37m/s to demonstrate improved community ambulation  Baseline: .33m/s with HW + CGA Goal status: INITIAL  Pt will improve TUG to </= 50 secs to demonstrated reduced fall risk  Baseline: 98s with HW + MinA Goal status: INITIAL  Pt will improve 5x STS to </= 15 sec to demo improved functional LE strength and balance   Baseline: 30.81 with CGA + R UE Goal status: INITIAL  ASSESSMENT:  CLINICAL IMPRESSION: Patient seen for skilled PT session today with emphasis on gait training and NMR with continued progression of gait through use of overloading. Patient tolerated over ground gait training with increased intensity and use of 5 pound ankle weight. Patient completed 141ft gait training without rest needed, as this is an improvement from previous sessions. Patient continues to complete HEP and was cued to ensure weight shifting to L LE during exercises through use of mirror in sitting and sit to stands. Good carry over with L LE weight shifting but cueing is still needed throughout. Recovery time was good with seated recovery needed during all tasks today. Patient still demonstrates increased levels of tone. Continue POC.    OBJECTIVE IMPAIRMENTS: Abnormal gait, decreased balance, decreased coordination, decreased endurance, decreased knowledge of condition, decreased knowledge of use of DME, decreased ROM, decreased strength, impaired tone, impaired UE functional use, and  postural dysfunction.   ACTIVITY LIMITATIONS: carrying, lifting, bending, standing, squatting, stairs, transfers, bed mobility, locomotion level, and caring for others  PARTICIPATION LIMITATIONS: meal prep, cleaning, interpersonal relationship, driving, shopping, community activity, occupation, and yard work  PERSONAL FACTORS: Age, Fitness, Past/current experiences, Time since onset of injury/illness/exacerbation, Transportation, and 3+ comorbidities: see above are also affecting patient's functional outcome.   REHAB POTENTIAL: Fair time since onset  CLINICAL DECISION MAKING: Stable/uncomplicated  EVALUATION COMPLEXITY: Low  PLAN:  PT FREQUENCY: 2x/week  PT DURATION: other: 7 weeks  PLANNED INTERVENTIONS: 97164- PT Re-evaluation, 97750- Physical Performance Testing, 97110-Therapeutic exercises, 97530- Therapeutic activity, [id number]- Neuromuscular re-education, 97535- Self Care, [id number]- Manual therapy, [id number]- Gait training, [phone]- Orthotic Initial, [phone]- Orthotic/Prosthetic subsequent, [phone]- Aquatic Therapy, [phone] (1-2 muscles), [phone] (3+ muscles)- Dry Needling, Patient/Family education, Balance training, Stair training, Taping, Vestibular training, Visual/preceptual remediation/compensation, DME instructions, and Wheelchair mobility training  PLAN FOR NEXT SESSION: HIGT! Continue working with [facility] Continue use of L LE, weight shifting in bars with toe taps, improve L hip flexion strength. Progress toe taps to over object and progress to weighted as tolerated. Weighted dowel sit to stand with LUE use, weight bearing throughout LUE   Emmalene  Amori Cooperman, Student-PT  06/18/2024, 10:56 AM

## 2024-06-20 ENCOUNTER — Ambulatory Visit

## 2024-06-20 VITALS — BP 114/68 | HR 55

## 2024-06-20 DIAGNOSIS — R293 Abnormal posture: Secondary | ICD-10-CM

## 2024-06-20 DIAGNOSIS — M6281 Muscle weakness (generalized): Secondary | ICD-10-CM

## 2024-06-20 DIAGNOSIS — R262 Difficulty in walking, not elsewhere classified: Secondary | ICD-10-CM

## 2024-06-20 DIAGNOSIS — R2689 Other abnormalities of gait and mobility: Secondary | ICD-10-CM

## 2024-06-20 DIAGNOSIS — R1900 Intra-abdominal and pelvic swelling, mass and lump, unspecified site: Secondary | ICD-10-CM | POA: Diagnosis not present

## 2024-06-20 NOTE — Therapy (Signed)
 OUTPATIENT PHYSICAL THERAPY NEURO TREATMENT   Patient Name: EARNIE BECHARD MRN: [id number] DOB:1956/05/21, 68 y.o., male Today's Date: 06/20/2024   PCP: Ezekiel Charleston, MD REFERRING PROVIDER: Wynne Flatten, MD  END OF SESSION:  PT End of Session - 06/20/24 0848     Visit Number 9    Number of Visits 15    Date for Recertification  07/13/24    Authorization Type [facility]    Authorization Time Period 05/13/24-09/10/24    Authorization - Number of Visits 15    Progress Note Due on Visit 10    PT Start Time 0846    PT Stop Time 0928    PT Time Calculation (min) 42 min    Equipment Utilized During Treatment Gait belt    Activity Tolerance Patient tolerated treatment well    Behavior During Therapy WFL for tasks assessed/performed              Past Medical History:  Diagnosis Date   Coronary artery disease    a. cath 04/02/2014 3v dx 80-90% in D1, 80-90% mid LAD, 95% apical LAD, 95% distal inferior LV branch of LCx, total occlusion of prox RCA  b). Cath 04/03/2014 DES to mid LAD, DES x3 to prox D1, DAPT indefinitely. LCx lesion untreated   GERD (gastroesophageal reflux disease)    HIV (human immunodeficiency virus infection) (HCC)    Hypertension    Ischemic cardiomyopathy 08/09/2013   EF 20-25 percent by echo in 04/2014, s/p [employer] model [id number] serial number [id number]   Stroke [facility])    Past Surgical History:  Procedure Laterality Date   CARDIAC CATHETERIZATION  04/02/2014   DR BURNARD   ESOPHAGOGASTRODUODENOSCOPY (EGD) WITH PROPOFOL  N/A 09/08/2018   Procedure: ESOPHAGOGASTRODUODENOSCOPY (EGD) WITH PROPOFOL ;  Surgeon: Rollin Dover, MD;  Location: [facility];  Service: Endoscopy;  Laterality: N/A;   IMPLANTABLE CARDIOVERTER DEFIBRILLATOR IMPLANT N/A 09/10/2014   Procedure: IMPLANTABLE CARDIOVERTER DEFIBRILLATOR IMPLANT;  Surgeon: Jerel Balding, MD;  [facility] model [id number] serial number [id number]   IR CT HEAD LTD  12/01/2023   IR PERCUTANEOUS  ART THROMBECTOMY/INFUSION INTRACRANIAL INC DIAG ANGIO  12/01/2023   IR US  GUIDE VASC ACCESS RIGHT  12/01/2023   LEFT AND RIGHT HEART CATHETERIZATION WITH CORONARY ANGIOGRAM N/A 04/02/2014   Procedure: LEFT AND RIGHT HEART CATHETERIZATION WITH CORONARY ANGIOGRAM;  Surgeon: Debby DELENA Burnard, MD;  Location: [facility] CATH LAB;  Service: Cardiovascular;  Laterality: N/A;   MEDIAL COLLATERAL LIGAMENT REPAIR, KNEE Right 08/25/2015   Procedure: RIGHT THUMB RADICAL COLLATERAL LIGAMENT REPAIR;  Surgeon: Donnice Robinsons, MD;  Location: [facility] OR;  Service: Orthopedics;  Laterality: Right;   PERCUTANEOUS CORONARY STENT INTERVENTION (PCI-S) N/A 04/03/2014   Procedure: PERCUTANEOUS CORONARY STENT INTERVENTION (PCI-S);  Surgeon: Peter M Jordan, MD;  Location: [facility] CATH LAB;  Service: Cardiovascular;  Laterality: N/A;   RADIOLOGY WITH ANESTHESIA N/A 12/01/2023   Procedure: RADIOLOGY WITH ANESTHESIA;  Surgeon: Radiologist, Medication, MD;  Location: [facility] OR;  Service: Radiology;  Laterality: N/A;  BETHANY BRADFORD DILATION N/A 09/08/2018   Procedure: SAVORY DILATION;  Surgeon: Rollin Dover, MD;  Location: [facility];  Service: Endoscopy;  Laterality: N/A;   Patient Active Problem List   Diagnosis Date Noted   ICD (implantable cardioverter-defibrillator) discharge 03/23/2024   Hyperlipidemia 01/02/2024   CKD stage 3a, GFR 45-59 ml/min (HCC) 01/02/2024   Depression with anxiety 12/27/2023   Chronic systolic congestive heart failure (HCC) 12/23/2023   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   Acute  ischemic right MCA stroke (HCC) 12/01/2023   Schizophrenia, unspecified (HCC) 03/07/2023   Recurrent major depression 03/07/2023   Pseudophakia of left eye 03/07/2023   Acid reflux 03/07/2023   Dyspepsia 03/07/2023   Anxiety state 03/07/2023   Cortical age-related cataract of right eye 03/07/2023   Psychotic disorder (HCC) 03/07/2023   CAD (coronary artery disease) 03/07/2023   Abdominal mass 07/12/2021    Peripheral vascular disease 02/04/2021   Overweight 01/21/2020   Dysphagia 12/04/2018   Hiatal hernia 04/19/2018   Gastroesophageal reflux disease 04/03/2018   Ischemic congestive cardiomyopathy (HCC) 10/17/2017   Generalized anxiety disorder 10/17/2017   Prediabetes 10/16/2017   Acquired thrombocytopenia 10/16/2017   Chronic kidney disease, stage 3 (HCC) 10/16/2017   Primary erectile dysfunction 10/13/2017   Chronic combined systolic and diastolic heart failure (HCC) 12/30/2015   Traumatic rupture of collateral ligament of other finger at metacarpophalangeal and interphalangeal joint, subsequent encounter 10/30/2015   Traumatic rupture of collateral ligament of other finger at metacarpophalangeal and interphalangeal joint, initial encounter 08/12/2015   VT (ventricular tachycardia) (HCC) 12/31/2014   ICD (implantable cardioverter-defibrillator) in place 09/10/2014   At risk for sudden cardiac death 2014-09-08   Systolic heart failure (HCC) 08/09/2014   Coronary artery disease involving native coronary artery of native heart without angina pectoris 04/04/2014   Unstable angina (HCC) 04/03/2014   Cardiomyopathy, ischemic 03/22/2014   History of CVA (cerebrovascular accident) 02/20/2014   Human immunodeficiency virus infection (HCC) 02/20/2014   Essential hypertension 02/20/2014   Hyperlipidemia LDL goal <70 02/20/2014   Depression 02/20/2014   Cerebral infarction (HCC) 08/09/2006    ONSET DATE: 05/15/24 referral    REFERRING DIAG: I63.9 (ICD-10-CM) - Cerebral infarction, unspecified   THERAPY DIAG:  Abnormal posture  Difficulty in walking, not elsewhere classified  Muscle weakness (generalized)  Other abnormalities of gait and mobility  Rationale for Evaluation and Treatment: Rehabilitation  SUBJECTIVE:                                                                                                                                                                                              SUBJECTIVE STATEMENT: [place]. Denies falls. Exercises going well. Per wife, has been napping less.  Pt accompanied by: significant other  PERTINENT HISTORY: CAD, GERD, HIV, HTN, ischemic cardiomyopathy, CVA  PAIN:  Are you having pain? No  PRECAUTIONS: Fall and ICD/Pacemaker; L hemi   PATIENT GOALS: to walk and use my hand   OBJECTIVE:  Note: Objective measures were completed at Evaluation unless otherwise noted.  DIAGNOSTIC FINDINGS: 12/01/23 head CT IMPRESSION: 1. Subtle hypoattenuation involving  the right caudate and right lentiform nuclei which may reflect acute infarct. 2. No acute intracranial hemorrhage. 3. Remote infarct involving the right frontal operculum, insula, and superior right temporal lobe. 4. Asymmetric density of the right M1 segment. Recommend correlation with CTA. 5. ASPECTS is 8   GAIT: Findings: Gait Characteristics: step to pattern, decreased arm swing- Left, decreased step length- Right, decreased stance time- Left, decreased stride length, decreased hip/knee flexion- Left, decreased ankle dorsiflexion- Left, circumduction- Left, Left hip hike, trunk rotated posterior- Left, trunk flexed, narrow BOS, and poor foot clearance- Left, Assistive device utilized:Hemi walker, and Level of assistance: CGA and Min A                                                                                                             TREATMENT  Theract:  [facility] PT Assessment - 06/20/24 0001       Standardized Balance Assessment   Five times sit to stand comments  24.5s no UE + CGA    10 Meter Walk .63m/s LBQC + CGA      Timed Up and Go Test   Normal TUG (seconds) 79   LBQC + CGA        NMR: -wc self propulsion hemi technique x75 ft -x2 laps in // bars with MinA and no UE support on bars   -verbal and tactile cues to facilitate greater weight shift L during stance  -scifit level 3 x8 mins B LE + RUE for large  amplitude reciprocal coordination  PATIENT EDUCATION: Education details: goal assessment outcome, continue HEP, use HW as much as is safe at home, use L LE in [place] propulsion as much as possible  Person educated: Patient and Spouse Education method: Explanation Education comprehension: verbalized understanding and needs further education  HOME EXERCISE PROGRAM: Access Code: [email or web address] URL: [email or web address] Date: 06/06/2024 Prepared by: Delon Pop  Exercises - Supine Bridge  - 1 x daily - 7 x weekly - 3 sets - 10 reps - Supine March with Posterior Pelvic Tilt  - 1 x daily - 7 x weekly - 3 sets - 10 reps - Mini Squat with Counter Support  - 1 x daily - 7 x weekly - 3 sets - 8-10 reps - Standing Knee Flexion with Counter Support  - 1 x daily - 7 x weekly - 3 sets - 6-8 reps - Standing March with Counter Support  - 1 x daily - 7 x weekly - 3 sets - 8-10 reps  GOALS: Goals reviewed with patient? Yes  SHORT TERM GOALS: Target date: 06/22/24  Pt will be independent with initial HEP for improved functional strength and balance  Baseline: to be provided  Goal status: MET  2.  Pt will improve gait speed to >/= .62m/s to demonstrate improved community ambulation  Baseline: .30m/s with HW + CGA; .66m/s with LBQC + CGA Goal status: MET  3.  Pt will improve TUG to </= 70 secs to demonstrated reduced fall risk  Baseline: 98s with HW + MinA; 79s LBQC +  CGA Goal status: IN PROGRESS  4.  Pt will improve 5x STS to </= 22 sec to demo improved functional LE strength and balance   Baseline: 30.81 with CGA + R UE; 24/5s no UE + CGA Goal status: IN PRPOGRESS   LONG TERM GOALS: Target date: 07/13/24 (to match cert date)   Pt will be independent with final HEP for improved functional strength and balance  Baseline: to be provided  Goal status: INITIAL  Pt will improve gait speed to >/= .75m/s to demonstrate improved community ambulation  Baseline: .29m/s with HW +  CGA Goal status: INITIAL  Pt will improve TUG to </= 50 secs to demonstrated reduced fall risk  Baseline: 98s with HW + MinA Goal status: INITIAL  Pt will improve 5x STS to </= 15 sec to demo improved functional LE strength and balance   Baseline: 30.81 with CGA + R UE Goal status: INITIAL  ASSESSMENT:  CLINICAL IMPRESSION: Patient seen for skilled PT session with emphasis on STG assessment. He met 2/4 STG, but improved toward the remaining goals. Patient completed the Timed Up and Go test (TUG) in 79 seconds.  Geriatrics: need for further assessment of fall risk: >= 12 sec; Recurrent falls: > 15 sec; Vestibular Disorders fall risk: > 15 sec; Parkinson's Disease fall risk: > 16 sec ([email or web address], 2023). Five times Sit to Stand Test (FTSS) Method: Use a straight back chair with a solid seat that is 17-18" high. Ask participant to sit on the chair with arms folded across their chest.   Instructions: "Stand up and sit down as quickly as possible 5 times, keeping your arms folded across your chest."   Measurement: Stop timing when the participant touches the chair in sitting the 5th time.  TIME: 24.5 sec  Cut off scores indicative of increased fall risk: >12 sec CVA, >16 sec PD, >13 sec vestibular (ANPTA Core Set of Outcome Measures for Adults with Neurologic Conditions, 2018). 10 Meter Walk Test: Patient instructed to walk 10 meters (32.8 ft) as quickly and as safely as possible at their normal speed x2 and at a fast speed x2. Time measured from 2 meter mark to 8 meter mark to accommodate ramp-up and ramp-down.  Normal speed: .71m/s Cut off scores: <0.4 m/s = household Ambulator, 0.4-0.8 m/s = limited community Ambulator, >0.8 m/s = community Ambulator, >1.2 m/s = crossing a street, <1.0 = increased fall risk MCID 0.05 m/s (small), 0.13 m/s (moderate), 0.06 m/s (significant)  (ANPTA Core Set of Outcome Measures for Adults with Neurologic Conditions, 2018). Patient continues to have a  strong postural bias toward R lateral lean and very minimal weight shift L during stance phase. Continue POC.   OBJECTIVE IMPAIRMENTS: Abnormal gait, decreased balance, decreased coordination, decreased endurance, decreased knowledge of condition, decreased knowledge of use of DME, decreased ROM, decreased strength, impaired tone, impaired UE functional use, and postural dysfunction.   ACTIVITY LIMITATIONS: carrying, lifting, bending, standing, squatting, stairs, transfers, bed mobility, locomotion level, and caring for others  PARTICIPATION LIMITATIONS: meal prep, cleaning, interpersonal relationship, driving, shopping, community activity, occupation, and yard work  PERSONAL FACTORS: Age, Fitness, Past/current experiences, Time since onset of injury/illness/exacerbation, Transportation, and 3+ comorbidities: see above are also affecting patient's functional outcome.   REHAB POTENTIAL: Fair time since onset  CLINICAL DECISION MAKING: Stable/uncomplicated  EVALUATION COMPLEXITY: Low  PLAN:  PT FREQUENCY: 2x/week  PT DURATION: other: 7 weeks  PLANNED INTERVENTIONS: 97164- PT Re-evaluation, 97750- Physical Performance Testing, 97110-Therapeutic exercises, 97530- Therapeutic activity, [id number]- Neuromuscular  re-education, [phone]- Self Care, [id number]- Manual therapy, [phone]- Gait training, [phone]- Orthotic Initial, [phone]- Orthotic/Prosthetic subsequent, [id number]- Aquatic Therapy, [phone] (1-2 muscles), [id number] (3+ muscles)- Dry Needling, Patient/Family education, Balance training, Stair training, Taping, Vestibular training, Visual/preceptual remediation/compensation, DME instructions, and Wheelchair mobility training  PLAN FOR NEXT SESSION: HIGT! Continue working with [facility] Continue use of L LE, weight shifting in bars with toe taps, improve L hip flexion strength. Progress toe taps to over object and progress to weighted as tolerated. Weighted dowel sit to stand with LUE use, weight bearing throughout  LUE   Delon DELENA Pop, PT Delon DELENA Pop, PT, DPT, CBIS  06/20/2024, 9:37 AM

## 2024-06-25 ENCOUNTER — Ambulatory Visit

## 2024-06-25 VITALS — BP 105/60 | HR 54

## 2024-06-25 DIAGNOSIS — R293 Abnormal posture: Secondary | ICD-10-CM

## 2024-06-25 DIAGNOSIS — M6281 Muscle weakness (generalized): Secondary | ICD-10-CM

## 2024-06-25 DIAGNOSIS — R262 Difficulty in walking, not elsewhere classified: Secondary | ICD-10-CM

## 2024-06-25 NOTE — Therapy (Addendum)
 OUTPATIENT PHYSICAL THERAPY NEURO TREATMENT/ 10th VISIT PROGRESS NOTE   Patient Name: Richard [name] MRN: [id number] DOB:[birth year], 68 y.o., male Today's Date: 06/25/2024   Physical Therapy Progress Note   Dates of Reporting Period:05/23/24-06/25/24  See Note below for Objective Data and Assessment of Progress/Goals.  Thank you for the referral of this patient. Delon DELENA Pop, PT, DPT, CBIS, CFVRS  PCP: Ezekiel Charleston, MD REFERRING PROVIDER: Wynne Flatten, MD  END OF SESSION:  PT End of Session - 06/25/24 0936     Visit Number 10    Number of Visits 15    Date for Recertification  07/13/24    Authorization Type [place]    Authorization Time Period 05/13/24-09/10/24    Authorization - Number of Visits 15    Progress Note Due on Visit 10    PT Start Time 0847    PT Stop Time 0930    PT Time Calculation (min) 43 min    Equipment Utilized During Treatment Gait belt    Activity Tolerance Patient tolerated treatment well    Behavior During Therapy WFL for tasks assessed/performed               Past Medical History:  Diagnosis Date   Coronary artery disease    a. cath 04/02/2014 3v dx 80-90% in D1, 80-90% mid LAD, 95% apical LAD, 95% distal inferior LV branch of LCx, total occlusion of prox RCA  b). Cath 04/03/2014 DES to mid LAD, DES x3 to prox D1, DAPT indefinitely. LCx lesion untreated   GERD (gastroesophageal reflux disease)    HIV (human immunodeficiency virus infection) (HCC)    Hypertension    Ischemic cardiomyopathy 08/09/2013   EF 20-25 percent by echo in 04/2014, s/p [employer] model [id number] serial number [id number]   Stroke [facility])    Past Surgical History:  Procedure Laterality Date   CARDIAC CATHETERIZATION  04/02/2014   DR BURNARD   ESOPHAGOGASTRODUODENOSCOPY (EGD) WITH PROPOFOL  N/A 09/08/2018   Procedure: ESOPHAGOGASTRODUODENOSCOPY (EGD) WITH PROPOFOL ;  Surgeon: Rollin Dover, MD;  Location: [facility] END[facility]COPY;  Service: Endoscopy;  Laterality:  N/A;   IMPLANTABLE CARDIOVERTER DEFIBRILLATOR IMPLANT N/A 09/10/2014   Procedure: IMPLANTABLE CARDIOVERTER DEFIBRILLATOR IMPLANT;  Surgeon: Jerel Balding, MD;  [facility] [place] model [id number] serial number [id number]   IR CT HEAD LTD  12/01/2023   IR PERCUTANEOUS ART THROMBECTOMY/INFUSION INTRACRANIAL INC DIAG ANGIO  12/01/2023   IR US  GUIDE VASC ACCESS RIGHT  12/01/2023   LEFT AND RIGHT HEART CATHETERIZATION WITH CORONARY ANGIOGRAM N/A 04/02/2014   Procedure: LEFT AND RIGHT HEART CATHETERIZATION WITH CORONARY ANGIOGRAM;  Surgeon: Debby DELENA Burnard, MD;  Location: [facility] CATH LAB;  Service: Cardiovascular;  Laterality: N/A;   MEDIAL COLLATERAL LIGAMENT REPAIR, KNEE Right 08/25/2015   Procedure: RIGHT THUMB RADICAL COLLATERAL LIGAMENT REPAIR;  Surgeon: Donnice Robinsons, MD;  Location: [facility] OR;  Service: Orthopedics;  Laterality: Right;   PERCUTANEOUS CORONARY STENT INTERVENTION (PCI-S) N/A 04/03/2014   Procedure: PERCUTANEOUS CORONARY STENT INTERVENTION (PCI-S);  Surgeon: Peter M Jordan, MD;  Location: [facility] CATH LAB;  Service: Cardiovascular;  Laterality: N/A;   RADIOLOGY WITH ANESTHESIA N/A 12/01/2023   Procedure: RADIOLOGY WITH ANESTHESIA;  Surgeon: Radiologist, Medication, MD;  Location: [facility] OR;  Service: Radiology;  Laterality: N/A;  BETHANY BRADFORD DILATION N/A 09/08/2018   Procedure: SAVORY DILATION;  Surgeon: Rollin Dover, MD;  Location: [facility] ENDOSCOPY;  Service: Endoscopy;  Laterality: N/A;   Patient Active Problem List   Diagnosis Date Noted   ICD (implantable cardioverter-defibrillator)  discharge 03/23/2024   Hyperlipidemia 01/02/2024   CKD stage 3a, GFR 45-59 ml/min (HCC) 01/02/2024   Depression with anxiety 12/27/2023   Chronic systolic congestive heart failure (HCC) 12/23/2023   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   Acute ischemic right MCA stroke (HCC) 12/01/2023   Schizophrenia, unspecified (HCC) 03/07/2023   Recurrent major depression 03/07/2023   Pseudophakia of left  eye 03/07/2023   Acid reflux 03/07/2023   Dyspepsia 03/07/2023   Anxiety state 03/07/2023   Cortical age-related cataract of right eye 03/07/2023   Psychotic disorder (HCC) 03/07/2023   CAD (coronary artery disease) 03/07/2023   Abdominal mass 07/12/2021   Peripheral vascular disease 02/04/2021   Overweight 01/21/2020   Dysphagia 12/04/2018   Hiatal hernia 04/19/2018   Gastroesophageal reflux disease 04/03/2018   Ischemic congestive cardiomyopathy (HCC) 10/17/2017   Generalized anxiety disorder 10/17/2017   Prediabetes 10/16/2017   Acquired thrombocytopenia 10/16/2017   Chronic kidney disease, stage 3 (HCC) 10/16/2017   Primary erectile dysfunction 10/13/2017   Chronic combined systolic and diastolic heart failure (HCC) 12/30/2015   Traumatic rupture of collateral ligament of other finger at metacarpophalangeal and interphalangeal joint, subsequent encounter 10/30/2015   Traumatic rupture of collateral ligament of other finger at metacarpophalangeal and interphalangeal joint, initial encounter 08/12/2015   VT (ventricular tachycardia) (HCC) 12/31/2014   ICD (implantable cardioverter-defibrillator) in place 09/10/2014   At risk for sudden cardiac death 04-Sep-2014   Systolic heart failure (HCC) 08/09/2014   Coronary artery disease involving native coronary artery of native heart without angina pectoris 04/04/2014   Unstable angina (HCC) 04/03/2014   Cardiomyopathy, ischemic 03/22/2014   History of CVA (cerebrovascular accident) 02/20/2014   Human immunodeficiency virus infection (HCC) 02/20/2014   Essential hypertension 02/20/2014   Hyperlipidemia LDL goal <70 02/20/2014   Depression 02/20/2014   Cerebral infarction (HCC) 08/09/2006    ONSET DATE: 05/15/24 referral    REFERRING DIAG: I63.9 (ICD-10-CM) - Cerebral infarction, unspecified   THERAPY DIAG:  Abnormal posture  Difficulty in walking, not elsewhere classified  Muscle weakness (generalized)  Rationale for  Evaluation and Treatment: Rehabilitation  SUBJECTIVE:                                                                                                                                                                                             SUBJECTIVE STATEMENT: [place] to clinic in [place]. Pt attended church yesterday in [facility]. Patient denies falls. Pt accompanied by: significant other  PERTINENT HISTORY: CAD, GERD, HIV, HTN, ischemic cardiomyopathy, CVA  PAIN:  Are you having pain? No  PRECAUTIONS: Fall and ICD/Pacemaker; L hemi  PATIENT GOALS: to walk and use my hand   OBJECTIVE:  Note: Objective measures were completed at Evaluation unless otherwise noted.  DIAGNOSTIC FINDINGS: 12/01/23 head CT IMPRESSION: 1. Subtle hypoattenuation involving the right caudate and right lentiform nuclei which may reflect acute infarct. 2. No acute intracranial hemorrhage. 3. Remote infarct involving the right frontal operculum, insula, and superior right temporal lobe. 4. Asymmetric density of the right M1 segment. Recommend correlation with CTA. 5. ASPECTS is 8   GAIT: Findings: Gait Characteristics: step to pattern, decreased arm swing- Left, decreased step length- Right, decreased stance time- Left, decreased stride length, decreased hip/knee flexion- Left, decreased ankle dorsiflexion- Left, circumduction- Left, Left hip hike, trunk rotated posterior- Left, trunk flexed, narrow BOS, and poor foot clearance- Left, Assistive device utilized:Hemi walker, and Level of assistance: CGA and Min A                                                                                                             TREATMENT  Theract: - Education on reducing tripping with use of toe cap through [facility] and continuation of HEP - SciFit level 3 x 6 minutes, reciprocal patterning for gait and musculature warm up - Mat transfers from sitting on table to kneeling on mat and quadruped  -  Tall kneeling at table was well tolerated but had decreased weight bearing through left UE due to increased tone, wrist extension and finger extension but tolerated weight bearing through fist - Quadruped:  - When transferring to floor mat, patient should move slowly as quick transfers elicit UE and LE spasticity - Transfer was not well tolerated do to decreased wt bearing ability and spasticity in UE and LE, but quadruped was hold for a short period of time and terminated due to fatigue.  - Should be progressed overtime and encouraged to be completed with appropriate steps for proper completion - Sit to stand transfers x 3 reps, No UE, emphasis on weight shifting to L LE - Ambulation (not weighted) x 50 ft, LBQC, CGA, min assist for postural corrections, cues for chest up, no loss of balance  PATIENT EDUCATION: Education details: see above Person educated: Patient and Spouse Education method: Explanation Education comprehension: verbalized understanding and needs further education  HOME EXERCISE PROGRAM: Access Code: [email or web address] URL: [email or web address] Date: 06/06/2024 Prepared by: Delon Pop  Exercises - Supine Bridge  - 1 x daily - 7 x weekly - 3 sets - 10 reps - Supine March with Posterior Pelvic Tilt  - 1 x daily - 7 x weekly - 3 sets - 10 reps - Mini Squat with Counter Support  - 1 x daily - 7 x weekly - 3 sets - 8-10 reps - Standing Knee Flexion with Counter Support  - 1 x daily - 7 x weekly - 3 sets - 6-8 reps - Standing March with Counter Support  - 1 x daily - 7 x weekly - 3 sets - 8-10 reps  GOALS: Goals reviewed with patient? Yes  SHORT TERM GOALS: Target date: 06/22/24  Pt will be independent with initial HEP for improved functional strength and balance  Baseline: to be provided  Goal status: MET  2.  Pt will improve gait speed to >/= .72m/s to demonstrate improved community ambulation  Baseline: .1m/s with HW + CGA; .52m/s with LBQC + CGA Goal  status: MET  3.  Pt will improve TUG to </= 70 secs to demonstrated reduced fall risk  Baseline: 98s with HW + MinA; 79s LBQC + CGA Goal status: IN PROGRESS  4.  Pt will improve 5x STS to </= 22 sec to demo improved functional LE strength and balance   Baseline: 30.81 with CGA + R UE; 24/5s no UE + CGA Goal status: IN PRPOGRESS   LONG TERM GOALS: Target date: 07/13/24 (to match cert date)   Pt will be independent with final HEP for improved functional strength and balance  Baseline: to be provided  Goal status: INITIAL  Pt will improve gait speed to >/= .78m/s to demonstrate improved community ambulation  Baseline: .42m/s with HW + CGA Goal status: INITIAL  Pt will improve TUG to </= 50 secs to demonstrated reduced fall risk  Baseline: 98s with HW + MinA Goal status: INITIAL  Pt will improve 5x STS to </= 15 sec to demo improved functional LE strength and balance   Baseline: 30.81 with CGA + R UE Goal status: INITIAL  ASSESSMENT:  CLINICAL IMPRESSION: Patient seen for skilled PT session with emphasis on mat transfers, and floor mat quadruped for joint approximation and weight bearing through left UE and LE. Patient tolerated treatment well but had significant fatigue with mat transfers from sitting on table to kneeling on mat and quadruped. Tall kneeling at table was well tolerated but pt had decreased weight bearing through left UE due to increased tone, wrist extension and finger extension but tolerated weight bearing through fist. When transferring to floor mat patient should move slowly as quick transfers elicit UE and LE spasticity. Transfer and quadruped on floor mat was not well tolerated do to decreased wt bearing ability and spasticity in UE and LE but quadruped was hold for a short period of time and terminated due to fatigue. This transfers should be progressed overtime and encouraged to be completed with appropriate steps for proper completion. Ambulation after  transfers was difficult due to fatigue. Recovery time was good with seated recovery needed during all tasks today. Patient still demonstrates increased levels of tone and has a PM&R appointment coming up to also discuss this. Continue POC.   OBJECTIVE IMPAIRMENTS: Abnormal gait, decreased balance, decreased coordination, decreased endurance, decreased knowledge of condition, decreased knowledge of use of DME, decreased ROM, decreased strength, impaired tone, impaired UE functional use, and postural dysfunction.   ACTIVITY LIMITATIONS: carrying, lifting, bending, standing, squatting, stairs, transfers, bed mobility, locomotion level, and caring for others  PARTICIPATION LIMITATIONS: meal prep, cleaning, interpersonal relationship, driving, shopping, community activity, occupation, and yard work  PERSONAL FACTORS: Age, Fitness, Past/current experiences, Time since onset of injury/illness/exacerbation, Transportation, and 3+ comorbidities: see above are also affecting patient's functional outcome.   REHAB POTENTIAL: Fair time since onset  CLINICAL DECISION MAKING: Stable/uncomplicated  EVALUATION COMPLEXITY: Low  PLAN:  PT FREQUENCY: 2x/week  PT DURATION: other: 7 weeks  PLANNED INTERVENTIONS: [id number]- PT Re-evaluation, 97750- Physical Performance Testing, 97110-Therapeutic exercises, 97530- Therapeutic activity, [id number]- Neuromuscular re-education, 97535- Self Care, [id number]- Manual therapy, [id number]- Gait training, [id number]- Orthotic Initial, [id number]- Orthotic/Prosthetic subsequent, [id number]- Aquatic Therapy, [phone] (1-2 muscles), [id number] (3+ muscles)- Dry Needling, Patient/Family  education, Location manager, Stair training, Taping, Vestibular training, Visual/preceptual remediation/compensation, DME instructions, and Wheelchair mobility training  PLAN FOR NEXT SESSION: HIGT! Continue working with [facility] Continue use of L LE, weight shifting in bars with toe taps, improve L hip flexion strength. Progress toe taps  to over object and progress to weighted as tolerated. Weighted dowel sit to stand with LUE use, weight bearing throughout LUE  Facilitate left weight shifting during ambulation   [employer], Student-PT  06/25/2024, 10:07 AM

## 2024-06-27 ENCOUNTER — Ambulatory Visit

## 2024-06-27 VITALS — BP 102/61 | HR 54

## 2024-06-27 DIAGNOSIS — R262 Difficulty in walking, not elsewhere classified: Secondary | ICD-10-CM

## 2024-06-27 DIAGNOSIS — R293 Abnormal posture: Secondary | ICD-10-CM | POA: Diagnosis not present

## 2024-06-27 DIAGNOSIS — R2689 Other abnormalities of gait and mobility: Secondary | ICD-10-CM

## 2024-06-27 DIAGNOSIS — M6281 Muscle weakness (generalized): Secondary | ICD-10-CM

## 2024-06-27 NOTE — Therapy (Unsigned)
 OUTPATIENT PHYSICAL THERAPY NEURO TREATMENT   Patient Name: Richard [name] MRN: [id number] DOB:August 06, 1956, 68 y.o., male Today's Date: 06/28/2024  PCP: Ezekiel Charleston, MD REFERRING PROVIDER: Wynne Flatten, MD  END OF SESSION:  PT End of Session - 06/27/24 0842     Visit Number 11    Number of Visits 15    Date for Recertification  07/13/24    Authorization Type [place]    Authorization Time Period 05/13/24-09/10/24    Authorization - Number of Visits 15    Progress Note Due on Visit 10    PT Start Time 0847    PT Stop Time 0928    PT Time Calculation (min) 41 min    Equipment Utilized During Treatment Gait belt    Activity Tolerance Patient tolerated treatment well    Behavior During Therapy [facility] for tasks assessed/performed               Past Medical History:  Diagnosis Date   Coronary artery disease    a. cath 04/02/2014 3v dx 80-90% in D1, 80-90% mid LAD, 95% apical LAD, 95% distal inferior LV branch of LCx, total occlusion of prox RCA  b). Cath 04/03/2014 DES to mid LAD, DES x3 to prox D1, DAPT indefinitely. LCx lesion untreated   GERD (gastroesophageal reflux disease)    HIV (human immunodeficiency virus infection) (HCC)    Hypertension    Ischemic cardiomyopathy 08/09/2013   EF 20-25 percent by echo in 04/2014, s/p [employer] [place] model [id number] serial number [id number]   Stroke [facility])    Past Surgical History:  Procedure Laterality Date   CARDIAC CATHETERIZATION  04/02/2014   DR BURNARD   ESOPHAGOGASTRODUODENOSCOPY (EGD) WITH PROPOFOL  N/A 09/08/2018   Procedure: ESOPHAGOGASTRODUODENOSCOPY (EGD) WITH PROPOFOL ;  Surgeon: Rollin Dover, MD;  Location: [facility];  Service: Endoscopy;  Laterality: N/A;   IMPLANTABLE CARDIOVERTER DEFIBRILLATOR IMPLANT N/A 09/10/2014   Procedure: IMPLANTABLE CARDIOVERTER DEFIBRILLATOR IMPLANT;  Surgeon: Jerel Balding, MD;  [facility] model [id number] serial number [id number]   IR CT HEAD LTD  12/01/2023   IR PERCUTANEOUS  ART THROMBECTOMY/INFUSION INTRACRANIAL INC DIAG ANGIO  12/01/2023   IR US  GUIDE VASC ACCESS RIGHT  12/01/2023   LEFT AND RIGHT HEART CATHETERIZATION WITH CORONARY ANGIOGRAM N/A 04/02/2014   Procedure: LEFT AND RIGHT HEART CATHETERIZATION WITH CORONARY ANGIOGRAM;  Surgeon: Debby DELENA Burnard, MD;  Location: [facility] CATH LAB;  Service: Cardiovascular;  Laterality: N/A;   MEDIAL COLLATERAL LIGAMENT REPAIR, KNEE Right 08/25/2015   Procedure: RIGHT THUMB RADICAL COLLATERAL LIGAMENT REPAIR;  Surgeon: Donnice Robinsons, MD;  Location: [facility] OR;  Service: Orthopedics;  Laterality: Right;   PERCUTANEOUS CORONARY STENT INTERVENTION (PCI-S) N/A 04/03/2014   Procedure: PERCUTANEOUS CORONARY STENT INTERVENTION (PCI-S);  Surgeon: Peter M Jordan, MD;  Location: [facility] C[facility] LAB;  Service: Cardiovascular;  Laterality: N/A;   RADIOLOGY WITH ANESTHESIA N/A 12/01/2023   Procedure: RADIOLOGY WITH ANESTHESIA;  Surgeon: Radiologist, Medication, MD;  Location: [facility] OR;  Service: Radiology;  Laterality: N/A;  BETHANY BRADFORD DILATION N/A 09/08/2018   Procedure: SAVORY DILATION;  Surgeon: Rollin Dover, MD;  Location: [facility];  Service: Endoscopy;  Laterality: N/A;   Patient Active Problem List   Diagnosis Date Noted   ICD (implantable cardioverter-defibrillator) discharge 03/23/2024   Hyperlipidemia 01/02/2024   CKD stage 3a, GFR 45-59 ml/min (HCC) 01/02/2024   Depression with anxiety 12/27/2023   Chronic systolic congestive heart failure (HCC) 12/23/2023   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   Acute  ischemic right MCA stroke (HCC) 12/01/2023   Schizophrenia, unspecified (HCC) 03/07/2023   Recurrent major depression 03/07/2023   Pseudophakia of left eye 03/07/2023   Acid reflux 03/07/2023   Dyspepsia 03/07/2023   Anxiety state 03/07/2023   Cortical age-related cataract of right eye 03/07/2023   Psychotic disorder (HCC) 03/07/2023   CAD (coronary artery disease) 03/07/2023   Abdominal mass 07/12/2021    Peripheral vascular disease 02/04/2021   Overweight 01/21/2020   Dysphagia 12/04/2018   Hiatal hernia 04/19/2018   Gastroesophageal reflux disease 04/03/2018   Ischemic congestive cardiomyopathy (HCC) 10/17/2017   Generalized anxiety disorder 10/17/2017   Prediabetes 10/16/2017   Acquired thrombocytopenia 10/16/2017   Chronic kidney disease, stage 3 (HCC) 10/16/2017   Primary erectile dysfunction 10/13/2017   Chronic combined systolic and diastolic heart failure (HCC) 12/30/2015   Traumatic rupture of collateral ligament of other finger at metacarpophalangeal and interphalangeal joint, subsequent encounter 10/30/2015   Traumatic rupture of collateral ligament of other finger at metacarpophalangeal and interphalangeal joint, initial encounter 08/12/2015   VT (ventricular tachycardia) (HCC) 12/31/2014   ICD (implantable cardioverter-defibrillator) in place 09/10/2014   At risk for sudden cardiac death 09-05-14   Systolic heart failure (HCC) 08/09/2014   Coronary artery disease involving native coronary artery of native heart without angina pectoris 04/04/2014   Unstable angina (HCC) 04/03/2014   Cardiomyopathy, ischemic 03/22/2014   History of CVA (cerebrovascular accident) 02/20/2014   Human immunodeficiency virus infection (HCC) 02/20/2014   Essential hypertension 02/20/2014   Hyperlipidemia LDL goal <70 02/20/2014   Depression 02/20/2014   Cerebral infarction (HCC) 08/09/2006    ONSET DATE: 05/15/24 referral    REFERRING DIAG: I63.9 (ICD-10-CM) - Cerebral infarction, unspecified   THERAPY DIAG:  Abnormal posture  Difficulty in walking, not elsewhere classified  Muscle weakness (generalized)  Other abnormalities of gait and mobility  Rationale for Evaluation and Treatment: Rehabilitation  SUBJECTIVE:                                                                                                                                                                                              SUBJECTIVE STATEMENT: [place]. Initial questions re: hosp f/u appt next week. Provided patient and wife with info. Denies falls.  Pt accompanied by: significant other  PERTINENT HISTORY: CAD, GERD, HIV, HTN, ischemic cardiomyopathy, CVA  PAIN:  Are you having pain? No  PRECAUTIONS: Fall and ICD/Pacemaker; L hemi   PATIENT GOALS: to walk and use my hand   OBJECTIVE:  Note: Objective measures were completed at Evaluation unless otherwise noted.  DIAGNOSTIC FINDINGS: 12/01/23 head CT IMPRESSION: 1.  Subtle hypoattenuation involving the right caudate and right lentiform nuclei which may reflect acute infarct. 2. No acute intracranial hemorrhage. 3. Remote infarct involving the right frontal operculum, insula, and superior right temporal lobe. 4. Asymmetric density of the right M1 segment. Recommend correlation with CTA. 5. ASPECTS is 8   GAIT: Findings: Gait Characteristics: step to pattern, decreased arm swing- Left, decreased step length- Right, decreased stance time- Left, decreased stride length, decreased hip/knee flexion- Left, decreased ankle dorsiflexion- Left, circumduction- Left, Left hip hike, trunk rotated posterior- Left, trunk flexed, narrow BOS, and poor foot clearance- Left, Assistive device utilized:Hemi walker, and Level of assistance: CGA and Min A                                                                                                             TREATMENT  Gait: -115' gait with LBQC   -manual facilitation L weight shift during L stance phase   -tactile facilitation of R glute med during R stance   -tactile facilitation at posterior pelvis to achieve neutral hip positioning during midstance B  -sidelying gait cycle L LE on powederboard using rhythmic initiation with noted good carryover to overground gait -17ftx2 mirror feedback on posture with good carryover of improved midline and incr appropriate L lateral weight  shift   NMR: -prone HSC with tapping for initiation and appropriate muscle contraction  ~1+ to 2-/5 muscle contraction noted -sidelying powderboard knee flex/extend   -blocked practice with repeated contractions, tactile cuing needed for hamstring activation   PATIENT EDUCATION: Education details: provided patient and spouse with address for hosp f/u visit next week, continue HEP Person educated: Patient and Spouse Education method: Explanation Education comprehension: verbalized understanding and needs further education  HOME EXERCISE PROGRAM: Access Code: [email or web address] URL: [email or web address] Date: 06/06/2024 Prepared by: Delon Pop  Exercises - Supine Bridge  - 1 x daily - 7 x weekly - 3 sets - 10 reps - Supine March with Posterior Pelvic Tilt  - 1 x daily - 7 x weekly - 3 sets - 10 reps - Mini Squat with Counter Support  - 1 x daily - 7 x weekly - 3 sets - 8-10 reps - Standing Knee Flexion with Counter Support  - 1 x daily - 7 x weekly - 3 sets - 6-8 reps - Standing March with Counter Support  - 1 x daily - 7 x weekly - 3 sets - 8-10 reps  GOALS: Goals reviewed with patient? Yes  SHORT TERM GOALS: Target date: 06/22/24  Pt will be independent with initial HEP for improved functional strength and balance  Baseline: to be provided  Goal status: MET  2.  Pt will improve gait speed to >/= .48m/s to demonstrate improved community ambulation  Baseline: .64m/s with HW + CGA; .83m/s with LBQC + CGA Goal status: MET  3.  Pt will improve TUG to </= 70 secs to demonstrated reduced fall risk  Baseline: 98s with HW + MinA; 79s LBQC + CGA Goal status: IN PROGRESS  4.  Pt will improve  5x STS to </= 22 sec to demo improved functional LE strength and balance   Baseline: 30.81 with CGA + R UE; 24/5s no UE + CGA Goal status: IN PRPOGRESS   LONG TERM GOALS: Target date: 07/13/24 (to match cert date)   Pt will be independent with final HEP for improved functional  strength and balance  Baseline: to be provided  Goal status: INITIAL  Pt will improve gait speed to >/= .21m/s to demonstrate improved community ambulation  Baseline: .22m/s with HW + CGA Goal status: INITIAL  Pt will improve TUG to </= 50 secs to demonstrated reduced fall risk  Baseline: 98s with HW + MinA Goal status: INITIAL  Pt will improve 5x STS to </= 15 sec to demo improved functional LE strength and balance   Baseline: 30.81 with CGA + R UE Goal status: INITIAL  ASSESSMENT:  CLINICAL IMPRESSION: Patient seen for skilled PT session with emphasis on L hemi NMR and gait retraining. Continues to demonstrate rather significant R lateral lean with minimal weight shift L during gait cycle. He did respond well to prolonged powderboard work with noted improvement in both midline and L lateral weight shift with facilitation after these interventions. Continued practice with this would be beneficial to minimize sequelae associated with prolonged R lateral lean in standing. Continue POC.   OBJECTIVE IMPAIRMENTS: Abnormal gait, decreased balance, decreased coordination, decreased endurance, decreased knowledge of condition, decreased knowledge of use of DME, decreased ROM, decreased strength, impaired tone, impaired UE functional use, and postural dysfunction.   ACTIVITY LIMITATIONS: carrying, lifting, bending, standing, squatting, stairs, transfers, bed mobility, locomotion level, and caring for others  PARTICIPATION LIMITATIONS: meal prep, cleaning, interpersonal relationship, driving, shopping, community activity, occupation, and yard work  PERSONAL FACTORS: Age, Fitness, Past/current experiences, Time since onset of injury/illness/exacerbation, Transportation, and 3+ comorbidities: see above are also affecting patient's functional outcome.   REHAB POTENTIAL: Fair time since onset  CLINICAL DECISION MAKING: Stable/uncomplicated  EVALUATION COMPLEXITY: Low  PLAN:  PT FREQUENCY:  2x/week  PT DURATION: other: 7 weeks  PLANNED INTERVENTIONS: 97164- PT Re-evaluation, 97750- Physical Performance Testing, 97110-Therapeutic exercises, 97530- Therapeutic activity, [id number]- Neuromuscular re-education, 97535- Self Care, [id number]- Manual therapy, [id number]- Gait training, [phone]- Orthotic Initial, [phone]- Orthotic/Prosthetic subsequent, [phone]- Aquatic Therapy, [phone] (1-2 muscles), [id number] (3+ muscles)- Dry Needling, Patient/Family education, Balance training, Stair training, Taping, Vestibular training, Visual/preceptual remediation/compensation, DME instructions, and Wheelchair mobility training  PLAN FOR NEXT SESSION: HIGT! Continue working with [facility] Continue use of L LE, weight shifting in bars with toe taps, improve L hip flexion strength. Facilitate left weight shifting during ambulation/ literally anything to get more weight on the left leg (modified SLS, R LE step ups so he has to spend more time on L, etc)  If they ask about POC: I see them the last visit scheduled and we can discuss re-cert vs d/c. [place] is [place], so it's a bit of a process, but I will handle it when I get back!    Delon DELENA Pop, PT Delon DELENA Pop, PT, DPT, CBIS, CFVRS 06/28/2024, 7:24 AM

## 2024-07-02 ENCOUNTER — Encounter: Payer: Self-pay | Admitting: Registered Nurse

## 2024-07-02 ENCOUNTER — Ambulatory Visit: Admitting: Physical Therapy

## 2024-07-02 ENCOUNTER — Encounter: Payer: Medicare (Managed Care) | Attending: Registered Nurse | Admitting: Registered Nurse

## 2024-07-02 VITALS — BP 100/63 | HR 58 | Ht 70.0 in | Wt 164.1 lb

## 2024-07-02 DIAGNOSIS — I10 Essential (primary) hypertension: Secondary | ICD-10-CM | POA: Insufficient documentation

## 2024-07-02 DIAGNOSIS — R2689 Other abnormalities of gait and mobility: Secondary | ICD-10-CM

## 2024-07-02 DIAGNOSIS — I63511 Cerebral infarction due to unspecified occlusion or stenosis of right middle cerebral artery: Secondary | ICD-10-CM | POA: Insufficient documentation

## 2024-07-02 DIAGNOSIS — M6281 Muscle weakness (generalized): Secondary | ICD-10-CM

## 2024-07-02 DIAGNOSIS — R293 Abnormal posture: Secondary | ICD-10-CM

## 2024-07-02 DIAGNOSIS — R262 Difficulty in walking, not elsewhere classified: Secondary | ICD-10-CM

## 2024-07-02 NOTE — Therapy (Signed)
 OUTPATIENT OCCUPATIONAL THERAPY NEURO EVALUATION  Patient Name: Richard [name] MRN: [id number] DOB:January 14, 1956, 68 y.o., male Today's Date: 07/09/2024  PCP: [name], MD REFERRING PROVIDER: [name], MD  END OF SESSION:  OT End of Session - 07/09/24 1321     Visit Number 1    Number of Visits 15    Date for Recertification  09/08/24    Authorization Type VA approved 15 visits from 06/07/24 - 10/05/24    OT Start Time 1225    OT Stop Time 1315    OT Time Calculation (min) 50 min    Activity Tolerance Patient tolerated treatment well    Behavior During Therapy [facility] for tasks assessed/performed          Past Medical History:  Diagnosis Date   Coronary artery disease    a. cath 04/02/2014 3v dx 80-90% in D1, 80-90% mid LAD, 95% apical LAD, 95% distal inferior LV branch of LCx, total occlusion of prox RCA  b). Cath 04/03/2014 DES to mid LAD, DES x3 to prox D1, DAPT indefinitely. LCx lesion untreated   GERD (gastroesophageal reflux disease)    HIV (human immunodeficiency virus infection) (HCC)    Hypertension    Ischemic cardiomyopathy 08/09/2013   EF 20-25 percent by echo in 04/2014, s/p [employer] model [id number] serial number [id number]   Stroke [facility])    Past Surgical History:  Procedure Laterality Date   CARDIAC CATHETERIZATION  04/02/2014   DR BURNARD   ESOPHAGOGASTRODUODENOSCOPY (EGD) WITH PROPOFOL  N/A 09/08/2018   Procedure: ESOPHAGOGASTRODUODENOSCOPY (EGD) WITH PROPOFOL ;  Surgeon: Rollin Dover, MD;  Location: [facility] ENDOSCOPY;  Service: Endoscopy;  Laterality: N/A;   IMPLANTABLE CARDIOVERTER DEFIBRILLATOR IMPLANT N/A 09/10/2014   Procedure: IMPLANTABLE CARDIOVERTER DEFIBRILLATOR IMPLANT;  Surgeon: Jerel Balding, MD;  [facility] model [phone] serial number [id number]   IR CT HEAD LTD  12/01/2023   IR PERCUTANEOUS ART THROMBECTOMY/INFUSION INTRACRANIAL INC DIAG ANGIO  12/01/2023   IR US  GUIDE VASC ACCESS RIGHT  12/01/2023   LEFT AND RIGHT  HEART CATHETERIZATION WITH CORONARY ANGIOGRAM N/A 04/02/2014   Procedure: LEFT AND RIGHT HEART CATHETERIZATION WITH CORONARY ANGIOGRAM;  Surgeon: Debby DELENA Burnard, MD;  Location: [facility] CATH LAB;  Service: Cardiovascular;  Laterality: N/A;   MEDIAL COLLATERAL LIGAMENT REPAIR, KNEE Right 08/25/2015   Procedure: RIGHT THUMB RADICAL COLLATERAL LIGAMENT REPAIR;  Surgeon: Donnice Robinsons, MD;  Location: [facility] OR;  Service: Orthopedics;  Laterality: Right;   PERCUTANEOUS CORONARY STENT INTERVENTION (PCI-S) N/A 04/03/2014   Procedure: PERCUTANEOUS CORONARY STENT INTERVENTION (PCI-S);  Surgeon: Peter M Jordan, MD;  Location: [facility] CATH LAB;  Service: Cardiovascular;  Laterality: N/A;   RADIOLOGY WITH ANESTHESIA N/A 12/01/2023   Procedure: RADIOLOGY WITH ANESTHESIA;  Surgeon: Radiologist, Medication, MD;  Location: [facility] OR;  Service: Radiology;  Laterality: N/A;  BETHANY BRADFORD DILATION N/A 09/08/2018   Procedure: SAVORY DILATION;  Surgeon: Rollin Dover, MD;  Location: [facility] ENDOSCOPY;  Service: Endoscopy;  Laterality: N/A;   Patient Active Problem List   Diagnosis Date Noted   ICD (implantable cardioverter-defibrillator) discharge 03/23/2024   Hyperlipidemia 01/02/2024   CKD stage 3a, GFR 45-59 ml/min (HCC) 01/02/2024   Depression with anxiety 12/27/2023   Chronic systolic congestive heart failure (HCC) 12/23/2023   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   Acute ischemic right MCA stroke (HCC) 12/01/2023   Schizophrenia, unspecified (HCC) 03/07/2023   Recurrent major depression 03/07/2023   Pseudophakia of left eye 03/07/2023   Acid reflux 03/07/2023  Dyspepsia 03/07/2023   Anxiety state 03/07/2023   Cortical age-related cataract of right eye 03/07/2023   Psychotic disorder (HCC) 03/07/2023   CAD (coronary artery disease) 03/07/2023   Abdominal mass 07/12/2021   Peripheral vascular disease 02/04/2021   Overweight 01/21/2020   Dysphagia 12/04/2018   Hiatal hernia 04/19/2018   Gastroesophageal  reflux disease 04/03/2018   Ischemic congestive cardiomyopathy (HCC) 10/17/2017   Generalized anxiety disorder 10/17/2017   Prediabetes 10/16/2017   Acquired thrombocytopenia 10/16/2017   Chronic kidney disease, stage 3 (HCC) 10/16/2017   Primary erectile dysfunction 10/13/2017   Chronic combined systolic and diastolic heart failure (HCC) 12/30/2015   Traumatic rupture of collateral ligament of other finger at metacarpophalangeal and interphalangeal joint, subsequent encounter 10/30/2015   Traumatic rupture of collateral ligament of other finger at metacarpophalangeal and interphalangeal joint, initial encounter 08/12/2015   VT (ventricular tachycardia) (HCC) 12/31/2014   ICD (implantable cardioverter-defibrillator) in place 09/10/2014   At risk for sudden cardiac death 08-29-2014   Systolic heart failure (HCC) 08/09/2014   Coronary artery disease involving native coronary artery of native heart without angina pectoris 04/04/2014   Unstable angina (HCC) 04/03/2014   Cardiomyopathy, ischemic 03/22/2014   History of CVA (cerebrovascular accident) 02/20/2014   Human immunodeficiency virus infection (HCC) 02/20/2014   Essential hypertension 02/20/2014   Hyperlipidemia LDL goal <70 02/20/2014   Depression 02/20/2014   Cerebral infarction (HCC) 08/09/2006    ONSET DATE: 06/19/2024  REFERRING DIAG: I63.9 (ICD-10-CM) - Cerebral infarction, unspecified  Note:  [employer] Auth# [id number] 06/07/24 - 10/05/24 15 OT visits  THERAPY DIAG:  Hemiplegia and hemiparesis following cerebral infarction affecting left non-dominant side (HCC)  Spasticity as late effect of cerebrovascular accident (CVA)  Unsteadiness on feet  Pain in left hand  Rationale for Evaluation and Treatment: Rehabilitation  SUBJECTIVE:   SUBJECTIVE STATEMENT: Pt reports pain Lt hand Pt accompanied by: significant other (wife)   PERTINENT HISTORY: presented 12/01/23 with L-sided weakness and slurred speech. CTA showed  occlusion of the right M1 segment, proximal M2 branch of the right MCA with intraluminal thrombus. S/p mechanical thrombectomy 4/24. CT showed subtle hypoattenuation involving the right caudate and right lentiform nuclei which may reflect acute infarct. MRI pending. PMH: CAD s/p CABG x3 2015, ischemic cardiomyopathy s/p ICD placement, HTN, HLD, HIV, GERD, CVA in 2008 without residual deficits  PRECAUTIONS: Fall and ICD/Pacemaker, no driving  WEIGHT BEARING RESTRICTIONS: No  PAIN:  Are you having pain? Lt hand fluctuates 5/10  FALLS: Has patient fallen in last 6 months? Yes. Number of falls 4; slid off sofa, fell off bed, nearly fell off commode into tub    LIVING ENVIRONMENT: Lives with: lives with their spouse Lives in: one level home Stairs: Yes: External: 1 steps; on right going up Has following equipment at home: Hemi walker, Wheelchair (manual), Shower bench, bed side commode, and Grab bars   PLOF: Independent  PATIENT GOALS: get out of this wheelchair  OBJECTIVE:  Note: Objective measures were completed at Evaluation unless otherwise noted.  HAND DOMINANCE: Right  ADLs:  Eating: assist to cut food Grooming: set up for brushing gums and implants, wife brushes dentures and shaves him (wife always shaved pt even prior to CVA) UB Dressing: mod to max assist LB Dressing: max assist Toileting: mod I  Bathing: mod assist - mostly seated Tub Shower transfers: min assist for leg management on Lt  Equipment: Transfer tub bench, Grab bars, and hand held shower  IADLs: dependent for all IADLS Handwriting: No changes  MOBILITY STATUS: uses w/c mostly, uses hemi walker some at home   UPPER EXTREMITY ROM:  RUE AROM WNL's  LUE AROM dominated by synergy pattern (goes into some sh abduction, elbow flex, pronation but no isolated movement) No active movement in hand Pt can only tolerate approx 75% passive shoulder flexion   UPPER EXTREMITY MMT:   NT d/t spasticity   HAND  FUNCTION: No functional use LT hand  COORDINATION: No functional use Lt hand  SENSATION: WFL  EDEMA: mild Lt hand  MUSCLE TONE: LUE: Moderate, Hypertonic, and Modifed Ashworth Scale 3 = Considerable increase in muscle tone, passive movement difficult at elbow, 2/4 wrist and fingers  COGNITION: Overall cognitive status: Impaired, decreased memory Wife reports he sometimes doesn't turn the sink off after washing hands  VISION: Subjective report: about the same to me. Got new glasses Baseline vision: Wears glasses all the time but not wearing at evaluation Visual history: cataract sx OU  VISION ASSESSMENT: Not tested    PERCEPTION: Not tested  PRAXIS: Not tested  OBSERVATIONS: dysarthria of speech, reports getting choked especially on water and saliva                                                                                                                             TREATMENT DATE: 07/09/24  Discussed O.T. findings and goals/POC  Issued initial HEP for LUE - see pt instructions for details. Pt will need assist from wife for 2 ex's and then can do the rest I'ly       PATIENT EDUCATION: Education details: initial LUE HEP  Person educated: Patient and Spouse Education method: Explanation, Demonstration, Verbal cues, and Handouts Education comprehension: verbalized understanding, verbal cues required, and needs further education  HOME EXERCISE PROGRAM: 07/09/24: Initial LUE HEP    GOALS: Goals reviewed with patient? Yes  SHORT TERM GOALS: Target date: 08/09/24  Independent with HEP for LUE Baseline: Goal status: IN PROGRESS  2.  Independent with splint wear and care for Lt hand/wrist (resting hand splint) Baseline:  Goal status: INITIAL  3.  Pt/family to verbalize understanding with hemi dressing/bathing techniques and A/E to increase independence with ADLS Baseline:  Goal status: INITIAL  4.  Pt to verbalize understanding with memory compensation  strategies  Baseline:  Goal status: INITIAL   LONG TERM GOALS: Target date: 09/08/24  Independent with updated HEP  Baseline:  Goal status: INITIAL  2.  Pt to consistently perform dressing and bathing with no more than min assist Baseline:  Goal status: INITIAL  3.  Pt to use LUE as stabilizer at least 25% of the time for ADLS/bilateral tasks Baseline:  Goal status: INITIAL  4.  Pt to cut food with A/E (rocker knife)  Baseline:  Goal status: INITIAL  5.  Pt to id 3 pain management strategies for Lt hand I'ly Baseline:  Goal status: INITIAL   ASSESSMENT:  CLINICAL IMPRESSION: Patient is a 68 y.o. male who was seen  today for occupational therapy evaluation for CVA w/ Lt hemiparesis in April 2025. Hx includes CAD s/p CABG x3 2015, ischemic cardiomyopathy s/p ICD placement, HTN, HLD, HIV, GERD, CVA in 2008 without residual deficits. Patient currently presents below baseline level of functioning demonstrating functional deficits and impairments as noted below. Pt would benefit from skilled OT services in the outpatient setting to work on impairments as noted below to help pt return to PLOF as able.   SABRA   PERFORMANCE DEFICITS: in functional skills including ADLs, IADLs, coordination, edema, tone, ROM, strength, pain, Fine motor control, Gross motor control, mobility, body mechanics, decreased knowledge of precautions, decreased knowledge of use of DME, and UE functional use, cognitive skills including memory.   IMPAIRMENTS: are limiting patient from ADLs, IADLs, rest and sleep, leisure, and social participation.   CO-MORBIDITIES: may have co-morbidities  that affects occupational performance. Patient will benefit from skilled OT to address above impairments and improve overall function.  MODIFICATION OR ASSISTANCE TO COMPLETE EVALUATION: No modification of tasks or assist necessary to complete an evaluation.  OT OCCUPATIONAL PROFILE AND HISTORY: Detailed assessment: Review of  records and additional review of physical, cognitive, psychosocial history related to current functional performance.  CLINICAL DECISION MAKING: Moderate - several treatment options, min-mod task modification necessary  REHAB POTENTIAL: Good  EVALUATION COMPLEXITY: Moderate    PLAN:  OT FREQUENCY: 2x/week  OT DURATION: 8 weeks  PLANNED INTERVENTIONS: 97535 self care/ADL training, 02889 therapeutic exercise, 97530 therapeutic activity, 97112 neuromuscular re-education, 97140 manual therapy, 97113 aquatic therapy, 97018 paraffin, 02960 fluidotherapy, 97010 moist heat, 97010 cryotherapy, 97760 Orthotic Initial, 97763 Orthotic/Prosthetic subsequent, passive range of motion, functional mobility training, visual/perceptual remediation/compensation, energy conservation, patient/family education, and DME and/or AE instructions  RECOMMENDED OTHER SERVICES: none at this time  CONSULTED AND AGREED WITH PLAN OF CARE: Patient and family member/caregiver  PLAN FOR NEXT SESSION: fabricate resting hand splint   NO estim d/t defibrillator   Burnard JINNY Roads, OT 07/09/2024, 1:22 PM

## 2024-07-02 NOTE — Therapy (Signed)
 OUTPATIENT PHYSICAL THERAPY NEURO TREATMENT   Patient Name: Richard [name] MRN: [id number] DOB:1956/05/03, 68 y.o., male Today's Date: 07/02/2024  PCP: Ezekiel Charleston, MD REFERRING PROVIDER: Wynne Flatten, MD  END OF SESSION:  PT End of Session - 07/02/24 0851     Visit Number 12    Number of Visits 15    Date for Recertification  07/13/24    Authorization Type [place]    Authorization Time Period 05/13/24-09/10/24    Authorization - Number of Visits 15    Progress Note Due on Visit 10    PT Start Time 0847    PT Stop Time 0930    PT Time Calculation (min) 43 min    Equipment Utilized During Treatment Gait belt    Activity Tolerance Patient tolerated treatment well    Behavior During Therapy WFL for tasks assessed/performed                Past Medical History:  Diagnosis Date   Coronary artery disease    a. cath 04/02/2014 3v dx 80-90% in D1, 80-90% mid LAD, 95% apical LAD, 95% distal inferior LV branch of LCx, total occlusion of prox RCA  b). Cath 04/03/2014 DES to mid LAD, DES x3 to prox D1, DAPT indefinitely. LCx lesion untreated   GERD (gastroesophageal reflux disease)    HIV (human immunodeficiency virus infection) (HCC)    Hypertension    Ischemic cardiomyopathy 08/09/2013   EF 20-25 percent by echo in 04/2014, s/p St. Jude [place] model [id number] serial number [id number]   Stroke [facility])    Past Surgical History:  Procedure Laterality Date   CARDIAC CATHETERIZATION  04/02/2014   DR BURNARD   ESOPHAGOGASTRODUODENOSCOPY (EGD) WITH PROPOFOL  N/A 09/08/2018   Procedure: ESOPHAGOGASTRODUODENOSCOPY (EGD) WITH PROPOFOL ;  Surgeon: Rollin Dover, MD;  [facility];  Service: Endoscopy;  Laterality: N/A;   IMPLANTABLE CARDIOVERTER DEFIBRILLATOR IMPLANT N/A 09/10/2014   Procedure: IMPLANTABLE CARDIOVERTER DEFIBRILLATOR IMPLANT;  Surgeon: Jerel Balding, MD;  [facility] [place] model [id number] serial number [id number]   IR CT HEAD LTD  12/01/2023   IR  PERCUTANEOUS ART THROMBECTOMY/INFUSION INTRACRANIAL INC DIAG ANGIO  12/01/2023   IR US  GUIDE VASC ACCESS RIGHT  12/01/2023   LEFT AND RIGHT HEART CATHETERIZATION WITH CORONARY ANGIOGRAM N/A 04/02/2014   Procedure: LEFT AND RIGHT HEART CATHETERIZATION WITH CORONARY ANGIOGRAM;  Surgeon: Debby DELENA Burnard, MD;  Location: [facility] C[facility] LAB;  Service: Cardiovascular;  Laterality: N/A;   MEDIAL COLLATERAL LIGAMENT REPAIR, KNEE Right 08/25/2015   Procedure: RIGHT THUMB RADICAL COLLATERAL LIGAMENT REPAIR;  Surgeon: Donnice Robinsons, MD;  [facility] OR;  Service: Orthopedics;  Laterality: Right;   PERCUTANEOUS CORONARY STENT INTERVENTION (PCI-S) N/A 04/03/2014   Procedure: PERCUTANEOUS CORONARY STENT INTERVENTION (PCI-S);  Surgeon: Peter M Jordan, MD;  Location: [facility] CATH LAB;  Service: Cardiovascular;  Laterality: N/A;   RADIOLOGY WITH ANESTHESIA N/A 12/01/2023   Procedure: RADIOLOGY WITH ANESTHESIA;  Surgeon: Radiologist, Medication, MD;  [facility] OR;  Service: Radiology;  Laterality: N/A;  BETHANY BRADFORD DILATION N/A 09/08/2018   Procedure: SAVORY DILATION;  Surgeon: Rollin Dover, MD;  [facility];  Service: Endoscopy;  Laterality: N/A;   Patient Active Problem List   Diagnosis Date Noted   ICD (implantable cardioverter-defibrillator) discharge 03/23/2024   Hyperlipidemia 01/02/2024   CKD stage 3a, GFR 45-59 ml/min (HCC) 01/02/2024   Depression with anxiety 12/27/2023   Chronic systolic congestive heart failure (HCC) 12/23/2023   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  Acute ischemic right MCA stroke (HCC) 12/01/2023   Schizophrenia, unspecified (HCC) 03/07/2023   Recurrent major depression 03/07/2023   Pseudophakia of left eye 03/07/2023   Acid reflux 03/07/2023   Dyspepsia 03/07/2023   Anxiety state 03/07/2023   Cortical age-related cataract of right eye 03/07/2023   Psychotic disorder (HCC) 03/07/2023   CAD (coronary artery disease) 03/07/2023   Abdominal mass  07/12/2021   Peripheral vascular disease 02/04/2021   Overweight 01/21/2020   Dysphagia 12/04/2018   Hiatal hernia 04/19/2018   Gastroesophageal reflux disease 04/03/2018   Ischemic congestive cardiomyopathy (HCC) 10/17/2017   Generalized anxiety disorder 10/17/2017   Prediabetes 10/16/2017   Acquired thrombocytopenia 10/16/2017   Chronic kidney disease, stage 3 (HCC) 10/16/2017   Primary erectile dysfunction 10/13/2017   Chronic combined systolic and diastolic heart failure (HCC) 12/30/2015   Traumatic rupture of collateral ligament of other finger at metacarpophalangeal and interphalangeal joint, subsequent encounter 10/30/2015   Traumatic rupture of collateral ligament of other finger at metacarpophalangeal and interphalangeal joint, initial encounter 08/12/2015   VT (ventricular tachycardia) (HCC) 12/31/2014   ICD (implantable cardioverter-defibrillator) in place 09/10/2014   At risk for sudden cardiac death 2014/08/31   Systolic heart failure (HCC) 08/09/2014   Coronary artery disease involving native coronary artery of native heart without angina pectoris 04/04/2014   Unstable angina (HCC) 04/03/2014   Cardiomyopathy, ischemic 03/22/2014   History of CVA (cerebrovascular accident) 02/20/2014   Human immunodeficiency virus infection (HCC) 02/20/2014   Essential hypertension 02/20/2014   Hyperlipidemia LDL goal <70 02/20/2014   Depression 02/20/2014   Cerebral infarction (HCC) 08/09/2006    ONSET DATE: 05/15/24 referral    REFERRING DIAG: I63.9 (ICD-10-CM) - Cerebral infarction, unspecified   THERAPY DIAG:  Abnormal posture  Difficulty in walking, not elsewhere classified  Muscle weakness (generalized)  Other abnormalities of gait and mobility  Rationale for Evaluation and Treatment: Rehabilitation  SUBJECTIVE:                                                                                                                                                                                              SUBJECTIVE STATEMENT:  Pt received seated in w/c in lobby. Pt denies any falls or acute changes since last visit.  Pt accompanied by: significant other, wife Veronica  PERTINENT HISTORY: CAD, GERD, HIV, HTN, ischemic cardiomyopathy, CVA  PAIN:  Are you having pain? No  PRECAUTIONS: Fall and ICD/Pacemaker; L hemi   PATIENT GOALS: to walk and use my hand   OBJECTIVE:  Note: Objective measures were completed at Evaluation unless otherwise noted.  DIAGNOSTIC FINDINGS: 12/01/23 head CT IMPRESSION: 1. Subtle  hypoattenuation involving the right caudate and right lentiform nuclei which may reflect acute infarct. 2. No acute intracranial hemorrhage. 3. Remote infarct involving the right frontal operculum, insula, and superior right temporal lobe. 4. Asymmetric density of the right M1 segment. Recommend correlation with CTA. 5. ASPECTS is 8   GAIT: Findings: Gait Characteristics: step to pattern, decreased arm swing- Left, decreased step length- Right, decreased stance time- Left, decreased stride length, decreased hip/knee flexion- Left, decreased ankle dorsiflexion- Left, circumduction- Left, Left hip hike, trunk rotated posterior- Left, trunk flexed, narrow BOS, and poor foot clearance- Left, Assistive device utilized:Hemi walker, and Level of assistance: CGA and Min A                                                                                                             TREATMENT   Gait: Gait pattern: decreased step length- Right, decreased stance time- Left, decreased hip/knee flexion- Left, lateral lean- Right, and narrow BOS Distance walked: 115 ft Assistive device utilized: Quad cane large base Level of assistance: Min A Comments: with L AFO, path deviation to the R, spasticity LLE; manual assist to weight shift to the L in LLE stance phase    NMR: At ballet bar to work on LLE AUTOMATIC DATA with RUE support Forwards/backwards steps with RLE with  manual assist to weight shift onto LLE, L knee blocked 3 x 10 reps Forwards/backwards steps with LLE with some manual assist to bring LE back Cues to decrease LLE step length due to imbalance 3 x 10 reps From EOM to [facility] Staggered sit to stand with LLE back/RLE forwards x 5 reps Tends to bring his RLE back with transfer, improves as task progresses    PATIENT EDUCATION: Education details: continue HEP, purpose of tasks done this therapy session Person educated: Patient and Spouse Education method: Explanation, Demonstration, Tactile cues, and Verbal cues Education comprehension: verbalized understanding, returned demonstration, verbal cues required, tactile cues required, and needs further education  HOME EXERCISE PROGRAM: Access Code: [email or web address] URL: [email or web address] Date: 06/06/2024 Prepared by: Delon Pop  Exercises - Supine Bridge  - 1 x daily - 7 x weekly - 3 sets - 10 reps - Supine March with Posterior Pelvic Tilt  - 1 x daily - 7 x weekly - 3 sets - 10 reps - Mini Squat with Counter Support  - 1 x daily - 7 x weekly - 3 sets - 8-10 reps - Standing Knee Flexion with Counter Support  - 1 x daily - 7 x weekly - 3 sets - 6-8 reps - Standing March with Counter Support  - 1 x daily - 7 x weekly - 3 sets - 8-10 reps  GOALS: Goals reviewed with patient? Yes  SHORT TERM GOALS: Target date: 06/22/24  Pt will be independent with initial HEP for improved functional strength and balance  Baseline: to be provided  Goal status: MET  2.  Pt will improve gait speed to >/= .26m/s to demonstrate improved community ambulation  Baseline: .38m/s with HW + CGA; .61m/s with LBQC +  CGA Goal status: MET  3.  Pt will improve TUG to </= 70 secs to demonstrated reduced fall risk  Baseline: 98s with HW + MinA; 79s LBQC + CGA Goal status: IN PROGRESS  4.  Pt will improve 5x STS to </= 22 sec to demo improved functional LE strength and balance   Baseline: 30.81 with CGA  + R UE; 24/5s no UE + CGA Goal status: IN PRPOGRESS   LONG TERM GOALS: Target date: 07/13/24 (to match cert date)   Pt will be independent with final HEP for improved functional strength and balance  Baseline: to be provided  Goal status: INITIAL  Pt will improve gait speed to >/= .51m/s to demonstrate improved community ambulation  Baseline: .66m/s with HW + CGA Goal status: INITIAL  Pt will improve TUG to </= 50 secs to demonstrated reduced fall risk  Baseline: 98s with HW + MinA Goal status: INITIAL  Pt will improve 5x STS to </= 15 sec to demo improved functional LE strength and balance   Baseline: 30.81 with CGA + R UE Goal status: INITIAL  ASSESSMENT:  CLINICAL IMPRESSION: Emphasis of skilled PT session on assessing gait with LBQC and working on LLE NMR. Pt exhibits above-noted gait deviations during gait with LBQC with cues needed to widen BOS, decrease path deviation to the R, and for placement of LBQC with RUE. He also exhibits increased spasticity in his LLE during gait making it difficulty to take consistent-length steps with this LE. He also exhibits decreased comfortability with stance on his LLE due to fear of this limb buckling. With blocked practice of stance on LLE he exhibits improved ability to weight shift to the L and increased tolerance for stance on LLE. He continues to benefit from skilled practice of this to work on increased safety and independence with functional mobility and decreased fall risk. Continue POC.   OBJECTIVE IMPAIRMENTS: Abnormal gait, decreased balance, decreased coordination, decreased endurance, decreased knowledge of condition, decreased knowledge of use of DME, decreased ROM, decreased strength, impaired tone, impaired UE functional use, and postural dysfunction.   ACTIVITY LIMITATIONS: carrying, lifting, bending, standing, squatting, stairs, transfers, bed mobility, locomotion level, and caring for others  PARTICIPATION LIMITATIONS: meal  prep, cleaning, interpersonal relationship, driving, shopping, community activity, occupation, and yard work  PERSONAL FACTORS: Age, Fitness, Past/current experiences, Time since onset of injury/illness/exacerbation, Transportation, and 3+ comorbidities: see above are also affecting patient's functional outcome.   REHAB POTENTIAL: Fair time since onset  CLINICAL DECISION MAKING: Stable/uncomplicated  EVALUATION COMPLEXITY: Low  PLAN:  PT FREQUENCY: 2x/week  PT DURATION: other: 7 weeks  PLANNED INTERVENTIONS: 97164- PT Re-evaluation, 97750- Physical Performance Testing, 97110-Therapeutic exercises, 97530- Therapeutic activity, [id number]- Neuromuscular re-education, 97535- Self Care, [id number]- Manual therapy, [id number]- Gait training, [phone]- Orthotic Initial, [phone]- Orthotic/Prosthetic subsequent, [phone]- Aquatic Therapy, [phone] (1-2 muscles), [id number] (3+ muscles)- Dry Needling, Patient/Family education, Balance training, Stair training, Taping, Vestibular training, Visual/preceptual remediation/compensation, DME instructions, and Wheelchair mobility training  PLAN FOR NEXT SESSION: HIGT! Continue working with [facility] Continue use of L LE, weight shifting in bars with toe taps, improve L hip flexion strength. Facilitate left weight shifting during ambulation/ literally anything to get more weight on the left leg (modified SLS, R LE step ups so he has to spend more time on L, etc); staggered sit to stands  If they ask about POC: I see them the last visit scheduled and we can discuss re-cert vs d/c. He is [facility], so it's a bit of a process,  but I will handle it when I get back!   Navjot Pilgrim, PT [place], PT, DPT, CSRS  07/02/2024, 9:30 AM

## 2024-07-02 NOTE — Progress Notes (Signed)
 Subjective:    Patient ID: Richard [name], male    DOB: 1955-11-21, 68 y.o.   MRN: [id number]  HPI: Richard [name] is a 68 y.o. male who is here for HFU appointment for F/U of his  Right Middle Cerebral artery stoke and Essential Hypertension. He presented to [facility] on 12/01/2023 via EMS with left sided weakness with dysarthria.   Dr Matthews H&P: 12/01/2023 Richard [name] is a 68 y.o. 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  Patient was last seen in his usual state of health last night at 6 PM when his wife came to the hospital to see a family member.  This morning, she called her husband and he initially did not answer but when she called again, she noticed that his speech was slurred and he asked her to come home because something was not right.  When his wife got home, she found the patient on the floor with left-sided weakness, left-sided facial droop, and slurred speech with complaints of a severe headache prompting EMS activation.  On EMS arrival, patient's symptoms were persistent and a code stroke was activated.   CT Head: WO Contrast: IMPRESSION: 1. Subtle hypoattenuation involving the right caudate and right lentiform nuclei which may reflect acute infarct. 2. No acute intracranial hemorrhage. 3. Remote infarct involving the right frontal operculum, insula, and superior right temporal lobe. 4. Asymmetric density of the right M1 segment. Recommend correlation with CTA. 5. ASPECTS is 8  CTA:  MPRESSION: Occlusion of the right M1 segment as well as a proximal M2 branch of the right MCA with intraluminal thrombus noted. Reconstitution of multiple M2 branches of the right MCA. Several M2  inferior division branches demonstrate diminished intraluminal contrast.   Diffuse region of elevated T-max throughout the right MCA territory concerning for ischemic changes without evidence of core infarct.   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 Reconstitution of the supraclinoid left ICA as well as the left ACA and left MCA via the circle-of-Willis.   Small right pleural effusion.   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 Matthews At 10:57 am on 12/01/2023 by text page via the [facility] messaging system.   He underwent revascularization:   He was admitted to inpatient rehabilitation on 12/06/2023 and discharged home on 01/05/2024, He received Home Health via [facility].  He was admitted on 03/23/2024 and discharged on 03/24/2024: ICD firing: Cardiology following.   Family reports he is walking with hemi walker in the home, he arrived in wheelchair.   Also reports he has a good appetite.    Pain Inventory Average Pain 5 Pain Right Now 0 My pain is aching  In the last 24 hours, has pain interfered with the following? General activity 0 Relation with others 0 Enjoyment of life 0 What TIME of day is your pain at its worst? morning  Sleep (in general) Good  Pain is worse with: some activites Pain improves with: medication Relief from Meds: 10  use a cane use a walker how many minutes can you walk? 5 minutes ability to climb steps?  yes do you  drive?  no use a wheelchair transfers alone  retired  weakness tremor trouble walking confusion depression anxiety  Any changes since last visit?  yes  Primary care Richard Drape, MD Neurologist [facility]    Family History  Problem Relation Age of Onset   Heart disease Mother    Esophageal cancer Neg Hx    Colon cancer Neg Hx    Social History    Socioeconomic History   Marital status: Married    Spouse name: Not on file   Number of children: 1   Years of education: Not on file   Highest education level: Not on file  Occupational History   Occupation: disable Vet  Tobacco Use   Smoking status: Never   Smokeless tobacco: Never  Vaping Use   Vaping status: Never Used  Substance and Sexual Activity   Alcohol use: No   Drug use: No   Sexual activity: Not Currently  Other Topics Concern   Not on file  Social History Narrative   He lives with his wife.  He was in the [employer] and gets his meds from the [place].  He has one child and two grands.    Social Drivers of Corporate Investment Banker Strain: Not on file  Food Insecurity: No Food Insecurity (03/24/2024)   Hunger Vital Sign    Worried About Running Out of Food in the Last Year: Never true    Ran Out of Food in the Last Year: Never true  Transportation Needs: No Transportation Needs (03/24/2024)   PRAPARE - Administrator, Civil Service (Medical): No    Lack of Transportation (Non-Medical): No  Physical Activity: Not on file  Stress: Not on file  Social Connections: Socially Isolated (03/24/2024)   Social Connection and Isolation Panel    Frequency of Communication with Friends and Family: Never    Frequency of Social Gatherings with Friends and Family: Never    Attends Religious Services: Never    Database Administrator or Organizations: No    Attends Banker Meetings: Never    Marital Status: Married   Past Surgical History:  Procedure Laterality Date   CARDIAC CATHETERIZATION  04/02/2014   DR BURNARD   ESOPHAGOGASTRODUODENOSCOPY (EGD) WITH PROPOFOL  N/A 09/08/2018   Procedure: ESOPHAGOGASTRODUODENOSCOPY (EGD) WITH PROPOFOL ;  Surgeon: Rollin Dover, MD;  [facility];  Service: Endoscopy;  Laterality: N/A;   IMPLANTABLE CARDIOVERTER DEFIBRILLATOR IMPLANT N/A 09/10/2014   Procedure: IMPLANTABLE CARDIOVERTER DEFIBRILLATOR IMPLANT;   Surgeon: Jerel Balding, MD;  [facility] model [phone] serial number [id number]   IR CT HEAD LTD  12/01/2023   IR PERCUTANEOUS ART THROMBECTOMY/INFUSION INTRACRANIAL INC DIAG ANGIO  12/01/2023   IR US  GUIDE VASC ACCESS RIGHT  12/01/2023   LEFT AND RIGHT HEART CATHETERIZATION WITH CORONARY ANGIOGRAM N/A 04/02/2014   Procedure: LEFT AND RIGHT HEART CATHETERIZATION WITH CORONARY ANGIOGRAM;  Surgeon: Debby DELENA Burnard, MD;  Location: [facility] CATH LAB;  Service: Cardiovascular;  Laterality: N/A;   MEDIAL COLLATERAL LIGAMENT REPAIR, KNEE Right 08/25/2015   Procedure: RIGHT THUMB RADICAL COLLATERAL LIGAMENT REPAIR;  Surgeon: Donnice Robinsons, MD;  Location: [facility] OR;  Service: Orthopedics;  Laterality: Right;   PERCUTANEOUS CORONARY STENT INTERVENTION (PCI-S) N/A 04/03/2014   Procedure: PERCUTANEOUS CORONARY STENT INTERVENTION (PCI-S);  Surgeon: Peter M Jordan, MD;  Location: [facility] CATH LAB;  Service: Cardiovascular;  Laterality: N/A;   RADIOLOGY WITH ANESTHESIA N/A 12/01/2023   Procedure: RADIOLOGY WITH ANESTHESIA;  Surgeon: Radiologist, Medication, MD;  [facility]: [facility] OR;  Service: Radiology;  Laterality: N/A;  BETHANY BRADFORD DILATION N/A 09/08/2018   Procedure: SAVORY DILATION;  Surgeon: Rollin Dover, MD;  [facility];  Service: Endoscopy;  Laterality: N/A;   Past Medical History:  Diagnosis Date   Coronary artery disease    a. cath 04/02/2014 3v dx 80-90% in D1, 80-90% mid LAD, 95% apical LAD, 95% distal inferior LV branch of LCx, total occlusion of prox RCA  b). Cath 04/03/2014 DES to mid LAD, DES x3 to prox D1, DAPT indefinitely. LCx lesion untreated   GERD (gastroesophageal reflux disease)    HIV (human immunodeficiency virus infection) (HCC)    Hypertension    Ischemic cardiomyopathy 08/09/2013   EF 20-25 percent by echo in 04/2014, s/p St. Jude [employer] [place] model [id number] serial number [id number]   Stroke (HCC)    BP 100/63 (BP Location: Right Arm, Patient Position: Sitting, Cuff  Size: Normal) Comment (BP Location): patient preference  Pulse (!) 55   Ht 5' 10 (1.778 m)   Wt 164 lb 1.6 oz (74.4 kg) Comment: last documented weight  SpO2 97%   BMI 23.55 kg/m   Opioid Risk Score:   Fall Risk Score:  `1  Depression screen [facility] 2/9     07/02/2024   10:58 AM 07/03/2014   10:24 AM 06/10/2014    9:39 AM  Depression screen PHQ 2/9  Decreased Interest 1 0 0  Down, Depressed, Hopeless 0 0 1  PHQ - 2 Score 1 0 1  Altered sleeping 0    Tired, decreased energy 1    Change in appetite 1    Feeling bad or failure about yourself  0    Trouble concentrating 1    Moving slowly or fidgety/restless 1    Suicidal thoughts 0    PHQ-9 Score 5    Difficult doing work/chores Somewhat difficult        Review of Systems  Constitutional:  Positive for chills and fever.  Respiratory:  Positive for cough.   Cardiovascular:        Coronary Artery Disease, CHF  Gastrointestinal:  Positive for constipation.  Endocrine: Positive for cold intolerance.  Musculoskeletal:  Positive for arthralgias, back pain and gait problem.       Low back pain, bilateral knee pain  Neurological:  Positive for tremors, speech difficulty and weakness.       Left arm and left sided weakness  Psychiatric/Behavioral:  Positive for confusion.        Depression, anxiety  All other systems reviewed and are negative.      Objective:   Physical Exam Vitals and nursing note reviewed.  Constitutional:      Appearance: Normal appearance.  Cardiovascular:     Rate and Rhythm: Regular rhythm. Bradycardia present.     Pulses: Normal pulses.     Heart sounds: Normal heart sounds.  Pulmonary:     Effort: Pulmonary effort is normal.     Breath sounds: Normal breath sounds.  Musculoskeletal:     Comments: Normal Muscle Bulk and Muscle Testing Reveals:  Upper Extremities: Right: Full ROM and Muscle Strength  5/5 Left Upper Extremity: Paralysis   Lower Extremities : Right: Full ROM and Muscle Strength  5/5 Left Lower Extremity: Paralysis Wearing Left AFO Arrived in wheelchair     Skin:    General: Skin is warm and dry.  Neurological:     Mental Status: He is alert and oriented to person, place, and time.  Psychiatric:        Mood and Affect: Mood normal.        Behavior: Behavior normal.          Assessment & Plan:  Right Middle Cerebral artery stoke: Continue current medication regimen. Continue Outpatient Therapy. Neurology Following. Continue to Monitor.   Essential Hypertension:  PCP following. Continue to monitor.  Bradycardia: Apical Pulse checked: H Cardiology following. Continue to monitor.   Family reports he is compliant with his medications. Continue to monitor.   F/U with Dr Carilyn in 4- 6 weeks

## 2024-07-04 ENCOUNTER — Ambulatory Visit: Admitting: Physical Therapy

## 2024-07-04 ENCOUNTER — Ambulatory Visit

## 2024-07-04 VITALS — BP 102/61 | HR 55

## 2024-07-04 DIAGNOSIS — R262 Difficulty in walking, not elsewhere classified: Secondary | ICD-10-CM

## 2024-07-04 DIAGNOSIS — R293 Abnormal posture: Secondary | ICD-10-CM | POA: Diagnosis not present

## 2024-07-04 DIAGNOSIS — R2689 Other abnormalities of gait and mobility: Secondary | ICD-10-CM

## 2024-07-04 DIAGNOSIS — M6281 Muscle weakness (generalized): Secondary | ICD-10-CM

## 2024-07-04 NOTE — Therapy (Signed)
 OUTPATIENT PHYSICAL THERAPY NEURO TREATMENT   Patient Name: Richard [name] MRN: [id number] DOB:[birth year], 68 y.o., male Today's Date: 07/04/2024  PCP: Ezekiel Charleston, MD REFERRING PROVIDER: Wynne Flatten, MD  END OF SESSION:  PT End of Session - 07/04/24 0801     Visit Number 13    Number of Visits 15    Date for Recertification  07/13/24    Authorization Type [place]    Authorization Time Period 05/13/24-09/10/24    Authorization - Number of Visits 15    Progress Note Due on Visit 10    PT Start Time 0800    PT Stop Time 0845    PT Time Calculation (min) 45 min    Equipment Utilized During Treatment Gait belt    Activity Tolerance Patient tolerated treatment well    Behavior During Therapy WFL for tasks assessed/performed                 Past Medical History:  Diagnosis Date   Coronary artery disease    a. cath 04/02/2014 3v dx 80-90% in D1, 80-90% mid LAD, 95% apical LAD, 95% distal inferior LV branch of LCx, total occlusion of prox RCA  b). Cath 04/03/2014 DES to mid LAD, DES x3 to prox D1, DAPT indefinitely. LCx lesion untreated   GERD (gastroesophageal reflux disease)    HIV (human immunodeficiency virus infection) (HCC)    Hypertension    Ischemic cardiomyopathy 08/09/2013   EF 20-25 percent by echo in 04/2014, s/p [employer] model [id number] serial number [id number]   Stroke [facility])    Past Surgical History:  Procedure Laterality Date   CARDIAC CATHETERIZATION  04/02/2014   DR BURNARD   ESOPHAGOGASTRODUODENOSCOPY (EGD) WITH PROPOFOL  N/A 09/08/2018   Procedure: ESOPHAGOGASTRODUODENOSCOPY (EGD) WITH PROPOFOL ;  Surgeon: Rollin Dover, MD;  Location: [facility] END[facility];  Service: Endoscopy;  Laterality: N/A;   IMPLANTABLE CARDIOVERTER DEFIBRILLATOR IMPLANT N/A 09/10/2014   Procedure: IMPLANTABLE CARDIOVERTER DEFIBRILLATOR IMPLANT;  Surgeon: Jerel Balding, MD;  [facility] model [id number] serial number [id number]   IR CT HEAD LTD  12/01/2023   IR  PERCUTANEOUS ART THROMBECTOMY/INFUSION INTRACRANIAL INC DIAG ANGIO  12/01/2023   IR US  GUIDE VASC ACCESS RIGHT  12/01/2023   LEFT AND RIGHT HEART CATHETERIZATION WITH CORONARY ANGIOGRAM N/A 04/02/2014   Procedure: LEFT AND RIGHT HEART CATHETERIZATION WITH CORONARY ANGIOGRAM;  Surgeon: Debby DELENA Burnard, MD;  Location: [facility] CATH LAB;  Service: Cardiovascular;  Laterality: N/A;   MEDIAL COLLATERAL LIGAMENT REPAIR, KNEE Right 08/25/2015   Procedure: RIGHT THUMB RADICAL COLLATERAL LIGAMENT REPAIR;  Surgeon: Donnice Robinsons, MD;  Location: [facility] OR;  Service: Orthopedics;  Laterality: Right;   PERCUTANEOUS CORONARY STENT INTERVENTION (PCI-S) N/A 04/03/2014   Procedure: PERCUTANEOUS CORONARY STENT INTERVENTION (PCI-S);  Surgeon: Peter M Jordan, MD;  Location: [facility] CATH LAB;  Service: Cardiovascular;  Laterality: N/A;   RADIOLOGY WITH ANESTHESIA N/A 12/01/2023   Procedure: RADIOLOGY WITH ANESTHESIA;  Surgeon: Radiologist, Medication, MD;  Location: [facility] OR;  Service: Radiology;  Laterality: N/A;  BETHANY BRADFORD DILATION N/A 09/08/2018   Procedure: SAVORY DILATION;  Surgeon: Rollin Dover, MD;  Location: [facility];  Service: Endoscopy;  Laterality: N/A;   Patient Active Problem List   Diagnosis Date Noted   ICD (implantable cardioverter-defibrillator) discharge 03/23/2024   Hyperlipidemia 01/02/2024   CKD stage 3a, GFR 45-59 ml/min (HCC) 01/02/2024   Depression with anxiety 12/27/2023   Chronic systolic congestive heart failure (HCC) 12/23/2023   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  Acute ischemic right MCA stroke (HCC) 12/01/2023   Schizophrenia, unspecified (HCC) 03/07/2023   Recurrent major depression 03/07/2023   Pseudophakia of left eye 03/07/2023   Acid reflux 03/07/2023   Dyspepsia 03/07/2023   Anxiety state 03/07/2023   Cortical age-related cataract of right eye 03/07/2023   Psychotic disorder (HCC) 03/07/2023   CAD (coronary artery disease) 03/07/2023   Abdominal mass  07/12/2021   Peripheral vascular disease 02/04/2021   Overweight 01/21/2020   Dysphagia 12/04/2018   Hiatal hernia 04/19/2018   Gastroesophageal reflux disease 04/03/2018   Ischemic congestive cardiomyopathy (HCC) 10/17/2017   Generalized anxiety disorder 10/17/2017   Prediabetes 10/16/2017   Acquired thrombocytopenia 10/16/2017   Chronic kidney disease, stage 3 (HCC) 10/16/2017   Primary erectile dysfunction 10/13/2017   Chronic combined systolic and diastolic heart failure (HCC) 12/30/2015   Traumatic rupture of collateral ligament of other finger at metacarpophalangeal and interphalangeal joint, subsequent encounter 10/30/2015   Traumatic rupture of collateral ligament of other finger at metacarpophalangeal and interphalangeal joint, initial encounter 08/12/2015   VT (ventricular tachycardia) (HCC) 12/31/2014   ICD (implantable cardioverter-defibrillator) in place 09/10/2014   At risk for sudden cardiac death 2014-08-30   Systolic heart failure (HCC) 08/09/2014   Coronary artery disease involving native coronary artery of native heart without angina pectoris 04/04/2014   Unstable angina (HCC) 04/03/2014   Cardiomyopathy, ischemic 03/22/2014   History of CVA (cerebrovascular accident) 02/20/2014   Human immunodeficiency virus infection (HCC) 02/20/2014   Essential hypertension 02/20/2014   Hyperlipidemia LDL goal <70 02/20/2014   Depression 02/20/2014   Cerebral infarction (HCC) 08/09/2006    ONSET DATE: 05/15/24 referral    REFERRING DIAG: I63.9 (ICD-10-CM) - Cerebral infarction, unspecified   THERAPY DIAG:  Abnormal posture  Difficulty in walking, not elsewhere classified  Muscle weakness (generalized)  Other abnormalities of gait and mobility  Rationale for Evaluation and Treatment: Rehabilitation  SUBJECTIVE:                                                                                                                                                                                              SUBJECTIVE STATEMENT:  Pt received seated in w/c in lobby. Pt denies any falls or acute changes since last visit.  Pt accompanied by: significant other, wife Veronica  PERTINENT HISTORY: CAD, GERD, HIV, HTN, ischemic cardiomyopathy, CVA  PAIN:  Are you having pain? No  PRECAUTIONS: Fall and ICD/Pacemaker; L hemi   PATIENT GOALS: to walk and use my hand   OBJECTIVE:  Note: Objective measures were completed at Evaluation unless otherwise noted.  DIAGNOSTIC FINDINGS: 12/01/23 head CT IMPRESSION: 1. Subtle  hypoattenuation involving the right caudate and right lentiform nuclei which may reflect acute infarct. 2. No acute intracranial hemorrhage. 3. Remote infarct involving the right frontal operculum, insula, and superior right temporal lobe. 4. Asymmetric density of the right M1 segment. Recommend correlation with CTA. 5. ASPECTS is 8   GAIT: Findings: Gait Characteristics: step to pattern, decreased arm swing- Left, decreased step length- Right, decreased stance time- Left, decreased stride length, decreased hip/knee flexion- Left, decreased ankle dorsiflexion- Left, circumduction- Left, Left hip hike, trunk rotated posterior- Left, trunk flexed, narrow BOS, and poor foot clearance- Left, Assistive device utilized:Hemi walker, and Level of assistance: CGA and Min A                                                                                                             TREATMENT   Self-Care/Home Management Vitals:   07/04/24 0805  BP: 102/61  Pulse: (!) 55     Gait: Gait pattern: decreased step length- Right, decreased stance time- Left, decreased hip/knee flexion- Left, lateral lean- Right, and narrow BOS Distance walked: various clinic distances Assistive device utilized: Quad cane large base Level of assistance: Min A Comments: with L AFO, path deviation to the R, spasticity LLE; manual assist to weight shift to the L in LLE stance  phase    NMR: At ballet bar to work on LLE AUTOMATIC DATA with RUE support Alt L/R 2 step taps with manual assist to weight shift onto L side, L knee blocked 4 x 10 reps BLE From EOM to [facility] Staggered sit to stand with RLE on 2 step 2 x 5 reps Use of mirror for visual feedback Mod manual assist to maintain weight shift in midline vs just using RLE Staggered stance with RLE on 2 step performing R hand volleyball hits on sunshine ball x 30 reps Min A for balance, several LOB but able to correct himself and remain standing     PATIENT EDUCATION: Education details: continue HEP, purpose of tasks done this therapy session Person educated: Patient and Spouse Education method: Explanation, Demonstration, Tactile cues, and Verbal cues Education comprehension: verbalized understanding, returned demonstration, verbal cues required, tactile cues required, and needs further education  HOME EXERCISE PROGRAM: Access Code: [email or web address] URL: [email or web address] Date: 06/06/2024 Prepared by: Delon Pop  Exercises - Supine Bridge  - 1 x daily - 7 x weekly - 3 sets - 10 reps - Supine March with Posterior Pelvic Tilt  - 1 x daily - 7 x weekly - 3 sets - 10 reps - Mini Squat with Counter Support  - 1 x daily - 7 x weekly - 3 sets - 8-10 reps - Standing Knee Flexion with Counter Support  - 1 x daily - 7 x weekly - 3 sets - 6-8 reps - Standing March with Counter Support  - 1 x daily - 7 x weekly - 3 sets - 8-10 reps  GOALS: Goals reviewed with patient? Yes  SHORT TERM GOALS: Target date: 06/22/24  Pt will be independent with initial HEP for improved functional  strength and balance  Baseline: to be provided  Goal status: MET  2.  Pt will improve gait speed to >/= .21m/s to demonstrate improved community ambulation  Baseline: .60m/s with HW + CGA; .55m/s with LBQC + CGA Goal status: MET  3.  Pt will improve TUG to </= 70 secs to demonstrated reduced fall risk  Baseline: 98s with  HW + MinA; 79s LBQC + CGA Goal status: IN PROGRESS  4.  Pt will improve 5x STS to </= 22 sec to demo improved functional LE strength and balance   Baseline: 30.81 with CGA + R UE; 24/5s no UE + CGA Goal status: IN PRPOGRESS   LONG TERM GOALS: Target date: 07/13/24 (to match cert date)   Pt will be independent with final HEP for improved functional strength and balance  Baseline: to be provided  Goal status: INITIAL  Pt will improve gait speed to >/= .33m/s to demonstrate improved community ambulation  Baseline: .67m/s with HW + CGA Goal status: INITIAL  Pt will improve TUG to </= 50 secs to demonstrated reduced fall risk  Baseline: 98s with HW + MinA Goal status: INITIAL  Pt will improve 5x STS to </= 15 sec to demo improved functional LE strength and balance   Baseline: 30.81 with CGA + R UE Goal status: INITIAL  ASSESSMENT:  CLINICAL IMPRESSION: Emphasis of skilled PT session on continuing to work on LLE NMR. He continues to exhibit decreased comfort with SLS on his LLE but improved from previous session. He also continues to exhibit decreased weight shift onto his LLE with sit to stand transfers and in standing. With blocked practice of stance on LLE he exhibits improved ability to weight shift to the L and increased tolerance for stance on LLE. He continues to benefit from skilled practice of this to work on increased safety and independence with functional mobility and decreased fall risk. Continue POC.   OBJECTIVE IMPAIRMENTS: Abnormal gait, decreased balance, decreased coordination, decreased endurance, decreased knowledge of condition, decreased knowledge of use of DME, decreased ROM, decreased strength, impaired tone, impaired UE functional use, and postural dysfunction.   ACTIVITY LIMITATIONS: carrying, lifting, bending, standing, squatting, stairs, transfers, bed mobility, locomotion level, and caring for others  PARTICIPATION LIMITATIONS: meal prep, cleaning,  interpersonal relationship, driving, shopping, community activity, occupation, and yard work  PERSONAL FACTORS: Age, Fitness, Past/current experiences, Time since onset of injury/illness/exacerbation, Transportation, and 3+ comorbidities: see above are also affecting patient's functional outcome.   REHAB POTENTIAL: Fair time since onset  CLINICAL DECISION MAKING: Stable/uncomplicated  EVALUATION COMPLEXITY: Low  PLAN:  PT FREQUENCY: 2x/week  PT DURATION: other: 7 weeks  PLANNED INTERVENTIONS: [id number]- PT Re-evaluation, 97750- Physical Performance Testing, 97110-Therapeutic exercises, 97530- Therapeutic activity, [id number]- Neuromuscular re-education, 97535- Self Care, [id number]- Manual therapy, [id number]- Gait training, [phone]- Orthotic Initial, [phone]- Orthotic/Prosthetic subsequent, [phone]- Aquatic Therapy, [phone] (1-2 muscles), [phone] (3+ muscles)- Dry Needling, Patient/Family education, Balance training, Stair training, Taping, Vestibular training, Visual/preceptual remediation/compensation, DME instructions, and Wheelchair mobility training  PLAN FOR NEXT SESSION: HIGT! Continue working with [facility] Continue use of L LE, weight shifting in bars with toe taps, improve L hip flexion strength. Facilitate left weight shifting during ambulation/ literally anything to get more weight on the left leg (modified SLS, R LE step ups so he has to spend more time on L, etc); staggered sit to stands  If they ask about POC: I see them the last visit scheduled and we can discuss re-cert vs d/c. [place] is [place], so  it's a bit of a process, but I will handle it when I get back!   Jsean Taussig, PT [place], PT, DPT, CSRS  07/04/2024, 8:47 AM

## 2024-07-09 ENCOUNTER — Ambulatory Visit: Payer: Medicare (Managed Care)

## 2024-07-09 ENCOUNTER — Ambulatory Visit: Admitting: Occupational Therapy

## 2024-07-09 DIAGNOSIS — R471 Dysarthria and anarthria: Secondary | ICD-10-CM | POA: Insufficient documentation

## 2024-07-09 DIAGNOSIS — R2681 Unsteadiness on feet: Secondary | ICD-10-CM | POA: Diagnosis present

## 2024-07-09 DIAGNOSIS — I69354 Hemiplegia and hemiparesis following cerebral infarction affecting left non-dominant side: Secondary | ICD-10-CM | POA: Diagnosis present

## 2024-07-09 DIAGNOSIS — M6281 Muscle weakness (generalized): Secondary | ICD-10-CM | POA: Diagnosis present

## 2024-07-09 DIAGNOSIS — R262 Difficulty in walking, not elsewhere classified: Secondary | ICD-10-CM | POA: Insufficient documentation

## 2024-07-09 DIAGNOSIS — R2689 Other abnormalities of gait and mobility: Secondary | ICD-10-CM | POA: Insufficient documentation

## 2024-07-09 DIAGNOSIS — R41841 Cognitive communication deficit: Secondary | ICD-10-CM | POA: Diagnosis present

## 2024-07-09 DIAGNOSIS — M79642 Pain in left hand: Secondary | ICD-10-CM | POA: Diagnosis present

## 2024-07-09 DIAGNOSIS — I69398 Other sequelae of cerebral infarction: Secondary | ICD-10-CM | POA: Insufficient documentation

## 2024-07-09 DIAGNOSIS — I472 Ventricular tachycardia, unspecified: Secondary | ICD-10-CM

## 2024-07-09 DIAGNOSIS — R293 Abnormal posture: Secondary | ICD-10-CM | POA: Insufficient documentation

## 2024-07-09 DIAGNOSIS — R1310 Dysphagia, unspecified: Secondary | ICD-10-CM | POA: Insufficient documentation

## 2024-07-09 DIAGNOSIS — R252 Cramp and spasm: Secondary | ICD-10-CM | POA: Insufficient documentation

## 2024-07-09 NOTE — Patient Instructions (Signed)
 Flexion (Passive)    Sitting upright, slide BOTH forearms forward along table (folded towel underneath), bending from the waist until a stretch is felt. Hold __3__ seconds. Repeat _10___ times. Can then do big circles counterclockwise x 10 reps.  Do __2-3__ sessions per day.  Flexion (Assistive)    Clasp hands together or hold wrist with Rt hand, and raise arms slowly above head (thumb side up), keeping elbows as straight as possible. Can be done sitting or lying. Need assist from spouse Repeat _10___ times. Do __3__ sessions per day.  Flexion (Passive)    Use other hand to bend elbow, with thumb toward same shoulder. Do NOT force this motion, then STRAIGHTEN elbow and hold _10__ seconds. Repeat __5__ times. Do __2__ sessions per day.   Supination (Passive)    Keep elbow bent at right angle and held firmly at side. Use other hand to turn forearm until palm faces upward. Do NOT lean to side.  Hold __10__ seconds. Repeat _5___ times. Do __3__ sessions per day.   Extension (Passive)    Using other hand to hold weak hand in palm, lift hand at wrist as far as possible. Hold __10__ seconds. Repeat __5__ times. Do __3__ sessions per day. Make sure to bring wrist straight up in alignment with forearm (not to one side more)   SELF ASSISTED: Finger Extension    Use one hand to open fingers on other hand. Do not overstretch or hyperextend big knuckles. Hold 10 seconds. Repeat _5__ reps per set, _3__ sets per day. Need assist from spouse

## 2024-07-10 LAB — CUP PACEART REMOTE DEVICE CHECK
Battery Remaining Longevity: 31 mo
Battery Remaining Percentage: 28 %
Battery Voltage: 2.86 V
Brady Statistic RV Percent Paced: 1 %
Date Time Interrogation Session: 20251201020018
HighPow Impedance: 64 Ohm
HighPow Impedance: 64 Ohm
Implantable Lead Connection Status: 753985
Implantable Lead Implant Date: 20160202
Implantable Lead Location: 753860
Implantable Pulse Generator Implant Date: 20160202
Lead Channel Impedance Value: 350 Ohm
Lead Channel Pacing Threshold Amplitude: 1 V
Lead Channel Pacing Threshold Pulse Width: 1 ms
Lead Channel Sensing Intrinsic Amplitude: 9.6 mV
Lead Channel Setting Pacing Amplitude: 2.5 V
Lead Channel Setting Pacing Pulse Width: 1 ms
Lead Channel Setting Sensing Sensitivity: 0.5 mV
Pulse Gen Serial Number: 7229147

## 2024-07-11 ENCOUNTER — Ambulatory Visit

## 2024-07-11 VITALS — BP 101/61 | HR 72

## 2024-07-11 DIAGNOSIS — I69354 Hemiplegia and hemiparesis following cerebral infarction affecting left non-dominant side: Secondary | ICD-10-CM | POA: Diagnosis not present

## 2024-07-11 DIAGNOSIS — R2689 Other abnormalities of gait and mobility: Secondary | ICD-10-CM

## 2024-07-11 DIAGNOSIS — R41841 Cognitive communication deficit: Secondary | ICD-10-CM

## 2024-07-11 DIAGNOSIS — R262 Difficulty in walking, not elsewhere classified: Secondary | ICD-10-CM

## 2024-07-11 DIAGNOSIS — R293 Abnormal posture: Secondary | ICD-10-CM

## 2024-07-11 DIAGNOSIS — R2681 Unsteadiness on feet: Secondary | ICD-10-CM

## 2024-07-11 DIAGNOSIS — R471 Dysarthria and anarthria: Secondary | ICD-10-CM

## 2024-07-11 DIAGNOSIS — M6281 Muscle weakness (generalized): Secondary | ICD-10-CM

## 2024-07-11 DIAGNOSIS — R1310 Dysphagia, unspecified: Secondary | ICD-10-CM

## 2024-07-11 NOTE — Therapy (Signed)
 OUTPATIENT SPEECH LANGUAGE PATHOLOGY EVALUATION   Patient Name: Richard [name] MRN: [id number] DOB:04-21-1956, 68 y.o., male Today's Date: 07/11/2024  PCP: [name], MD REFERRING PROVIDER: [name], MD  END OF SESSION:  End of Session - 07/11/24 0951     Visit Number 1    Number of Visits 25    Date for Recertification  10/03/24    SLP Start Time 0845    SLP Stop Time  0930    SLP Time Calculation (min) 45 min    Activity Tolerance Patient tolerated treatment well          Past Medical History:  Diagnosis Date   Coronary artery disease    a. cath 04/02/2014 3v dx 80-90% in D1, 80-90% mid LAD, 95% apical LAD, 95% distal inferior LV branch of LCx, total occlusion of prox RCA  b). Cath 04/03/2014 DES to mid LAD, DES x3 to prox D1, DAPT indefinitely. LCx lesion untreated   GERD (gastroesophageal reflux disease)    HIV (human immunodeficiency virus infection) (HCC)    Hypertension    Ischemic cardiomyopathy 08/09/2013   EF 20-25 percent by echo in 04/2014, s/p [employer] [place] model [id number] serial number [id number]   Stroke [facility])    Past Surgical History:  Procedure Laterality Date   CARDIAC CATHETERIZATION  04/02/2014   DR BURNARD   ESOPHAGOGASTRODUODENOSCOPY (EGD) WITH PROPOFOL  N/A 09/08/2018   Procedure: ESOPHAGOGASTRODUODENOSCOPY (EGD) WITH PROPOFOL ;  Surgeon: Rollin Dover, MD;  Location: [facility];  Service: Endoscopy;  Laterality: N/A;   IMPLANTABLE CARDIOVERTER DEFIBRILLATOR IMPLANT N/A 09/10/2014   Procedure: IMPLANTABLE CARDIOVERTER DEFIBRILLATOR IMPLANT;  Surgeon: Jerel Balding, MD;  [facility] [place] model [id number] serial number [id number]   IR CT HEAD LTD  12/01/2023   IR PERCUTANEOUS ART THROMBECTOMY/INFUSION INTRACRANIAL INC DIAG ANGIO  12/01/2023   IR US  GUIDE VASC ACCESS RIGHT  12/01/2023   LEFT AND RIGHT HEART CATHETERIZATION WITH CORONARY ANGIOGRAM N/A 04/02/2014   Procedure: LEFT AND RIGHT HEART CATHETERIZATION WITH CORONARY  ANGIOGRAM;  Surgeon: Debby DELENA Burnard, MD;  Location: [facility] CATH LAB;  Service: Cardiovascular;  Laterality: N/A;   MEDIAL COLLATERAL LIGAMENT REPAIR, KNEE Right 08/25/2015   Procedure: RIGHT THUMB RADICAL COLLATERAL LIGAMENT REPAIR;  Surgeon: Donnice Robinsons, MD;  Location: [facility] OR;  Service: Orthopedics;  Laterality: Right;   PERCUTANEOUS CORONARY STENT INTERVENTION (PCI-S) N/A 04/03/2014   Procedure: PERCUTANEOUS CORONARY STENT INTERVENTION (PCI-S);  Surgeon: Peter M Jordan, MD;  Location: [facility] CATH LAB;  Service: Cardiovascular;  Laterality: N/A;   RADIOLOGY WITH ANESTHESIA N/A 12/01/2023   Procedure: RADIOLOGY WITH ANESTHESIA;  Surgeon: Radiologist, Medication, MD;  Location: [facility] OR;  Service: Radiology;  Laterality: N/A;  BETHANY BRADFORD DILATION N/A 09/08/2018   Procedure: SAVORY DILATION;  Surgeon: Rollin Dover, MD;  Location: [facility];  Service: Endoscopy;  Laterality: N/A;   Patient Active Problem List   Diagnosis Date Noted   ICD (implantable cardioverter-defibrillator) discharge 03/23/2024   Hyperlipidemia 01/02/2024   CKD stage 3a, GFR 45-59 ml/min (HCC) 01/02/2024   Depression with anxiety 12/27/2023   Chronic systolic congestive heart failure (HCC) 12/23/2023   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   Acute ischemic right MCA stroke (HCC) 12/01/2023   Schizophrenia, unspecified (HCC) 03/07/2023   Recurrent major depression 03/07/2023   Pseudophakia of left eye 03/07/2023   Acid reflux 03/07/2023   Dyspepsia 03/07/2023   Anxiety state 03/07/2023   Cortical age-related cataract of right eye 03/07/2023   Psychotic disorder (HCC) 03/07/2023  CAD (coronary artery disease) 03/07/2023   Abdominal mass 07/12/2021   Peripheral vascular disease 02/04/2021   Overweight 01/21/2020   Dysphagia 12/04/2018   Hiatal hernia 04/19/2018   Gastroesophageal reflux disease 04/03/2018   Ischemic congestive cardiomyopathy (HCC) 10/17/2017   Generalized anxiety disorder 10/17/2017    Prediabetes 10/16/2017   Acquired thrombocytopenia 10/16/2017   Chronic kidney disease, stage 3 (HCC) 10/16/2017   Primary erectile dysfunction 10/13/2017   Chronic combined systolic and diastolic heart failure (HCC) 12/30/2015   Traumatic rupture of collateral ligament of other finger at metacarpophalangeal and interphalangeal joint, subsequent encounter 10/30/2015   Traumatic rupture of collateral ligament of other finger at metacarpophalangeal and interphalangeal joint, initial encounter 08/12/2015   VT (ventricular tachycardia) (HCC) 12/31/2014   ICD (implantable cardioverter-defibrillator) in place 09/10/2014   At risk for sudden cardiac death 09-05-14   Systolic heart failure (HCC) 08/09/2014   Coronary artery disease involving native coronary artery of native heart without angina pectoris 04/04/2014   Unstable angina (HCC) 04/03/2014   Cardiomyopathy, ischemic 03/22/2014   History of CVA (cerebrovascular accident) 02/20/2014   Human immunodeficiency virus infection (HCC) 02/20/2014   Essential hypertension 02/20/2014   Hyperlipidemia LDL goal <70 02/20/2014   Depression 02/20/2014   Cerebral infarction (HCC) 08/09/2006    ONSET DATE: 11/2023  REFERRING DIAG: I63.9 (ICD-10-CM) - Cerebral infarction, unspecified  THERAPY DIAG:  Dysarthria and anarthria  Cognitive communication deficit  Dysphagia, unspecified type  Rationale for Evaluation and Treatment: Rehabilitation  SUBJECTIVE:   SUBJECTIVE STATEMENT: It irritates something here in my throat, and I start coughing. Pt accompanied by: self and significant other  PERTINENT HISTORY: Pt is a 68 y.o. male who has a recent hx of CVA back in April, 2025. Pt noted residual deficits include dysarthria, cognitive-communication deficits and dysphagia. Pt's wife was present and provided additional hx. Pt's wife notices that pt's speech is low in volume during conversations. Pt recently received HH ST, primarily targeting  dysarthria at sentence and conversation levels. Pt also received ST in CIR 11/2023-12/2023, targeting swallowing, dysarthria, and cognition. Pt currently consumes regular/thin-liquid diet. Pt endorses coughing and choking on secretions on occasion. Pt's wife handles household responsibilities (finances, medication management, etc.). Pt's wife states that pt tends to lose focus during longer tasks. Pt enjoys watching sports at home. PMHx: CAD, GERD, HIV, HTN, ischemic cardiomyopathy, CVA.  PAIN:  Are you having pain? No  FALLS: Has patient fallen in last 6 months?  See PT evaluation for details  LIVING ENVIRONMENT: Lives with: lives with their family and lives with their spouse Lives in: House/apartment  PLOF:  Level of assistance: Comment: Independent with driving and medication management; wife still did finances  Employment: On disability  PATIENT GOALS: To improve thinking skills, swallowing, and speech.  OBJECTIVE:  Note: Objective measures were completed at Evaluation unless otherwise noted.  DIAGNOSTIC FINDINGS: MBSS from 12/08/23 Patient presents with mild oropharyngeal dysphagia. Oral phase is characterized by reduced lingual movement and labial seal resulting in anterior and posterior spillage. Patient independently utilized multiple swallows and digital sweep when necessary to clear oral residuals. Pharyngeal phase is remarkable for reduced epiglottic inversion and pharyngeal stripping wave resulting in mild pharyngeal residuals, mainly present in the vallecula and pyriform sinuses. Penetration present x2 with thin liquids (x1 via straw, x1 during liquid wash), though likely due to premature spillage and mistiming of epiglottic inversion and laryngeal vestibular closure. Pill consumed in puree, requiring additional bites of PO due to suspected esophageal retention. Recommend continuation of D2/thin  liquid diet with small, single sips of liquids. Patient should continue to utilize  digital sweep to clear buccal pocketing of solids. Recommend medications administered whole in puree with additional bites to promote esophogeal clearance.    DIGEST Swallow Severity Rating*             Safety: 1             Efficiency: 0             Overall Pharyngeal Swallow Severity: 1 mild 1: mild; 2: moderate; 3: severe; 4: profound  *The Dynamic Imaging Grade of Swallowing Toxicity is standardized for the head and neck cancer population, however, demonstrates promising clinical applications across populations to standardize the clinical rating of pharyngeal swallow safety and severity. Factors that may increase risk of adverse event in presence of aspiration Noe & Lianne 2021): Factors that may increase risk of adverse event in presence of aspiration Noe & Lianne 2021): Limited mobility  COGNITION: Overall cognitive status: Impaired Areas of impairment:  Attention: Impaired: Selective Memory: Impaired: Immediate Working Short term Psychologist, Educational function: Impaired: Impulse control, Organization, Planning, and Error awareness Behavior: Impulsive Functional deficits: Medication management, time management.  AUDITORY COMPREHENSION: Overall auditory comprehension: Appears intact   EXPRESSION: verbal    MOTOR SPEECH: Overall motor speech: impaired Level of impairment: Conversation Respiration: thoracic breathing and diaphragmatic/abdominal breathing Phonation: normal and low vocal intensity Resonance: WFL Articulation: Impaired: conversation Intelligibility: Intelligibility reduced Motor planning: Appears intact Motor speech errors: aware and consistent Interfering components: N/A Effective technique: increased vocal intensity and pacing  ORAL MOTOR EXAMINATION: Overall status: Impaired:   Lingual: Left (Symmetry and Strength) Comments: N/A  RECOMMENDATIONS FROM OBJECTIVE SWALLOW STUDY (MBSS/FEES):  Check diagnostics above.    CLINICAL SWALLOW ASSESSMENT:    Current diet: regular and thin liquids Dentition: Both top and bottom dentures. Patient directly observed with POs: Yes: regular and thin liquids  Feeding: able to feed self Liquids provided by: cup Oral phase signs and symptoms: anterior loss/spillage and prolonged mastication Pharyngeal phase signs and symptoms: complaints of residue Comments:   STANDARDIZED ASSESSMENTS: SLUMS: 20/30  PATIENT REPORTED OUTCOME MEASURES (PROM): EAT-10: 20/36 and Communication Effectiveness Survey: 11/24                                                                                                                            TREATMENT DATE:   07/11/24: Evaluation completed. No tx was conducted on this date.    PATIENT EDUCATION: Education details: Evaluation results/goal setting. Person educated: Patient and Spouse Education method: Explanation Education comprehension: verbalized understanding   GOALS: Goals reviewed with patient? Yes  SHORT TERM GOALS: Target date: 08/15/2024  Pt will achieve average >70 dB during 8 minute conversation given occasional min A. Baseline: Goal status: INITIAL  2.  Pt will perform pharyngeal and lingual strengthening exercises with 90% consistency when given rare min A. Baseline:  Goal status: INITIAL  3.  Pt and pt's family will utilize at  least 1 external memory strategy for maximizing attention during medication management given occasional min A.  Baseline:  Goal status: INITIAL  4.  Pt will display use of aspiration precautions during PO trials of regular/thin-liquids in 90% of opportunities given rare min A Baseline:  Goal status: INITIAL  5.  Pt will achieve 95-97% speech intelligibility during 8 minute conversation with familiar listeners given occasional min A.  Baseline:  Goal status: INITIAL   LONG TERM GOALS: Target date: 10/03/2024  Pt will achieve average >70 dB during a 12 minute conversation given rare min A.  Baseline:  Goal  status: INITIAL  2.  Pt will reduce score on EAT-10 PROM, indicating improved self-perception of swallowing function.  Baseline: 20/36 Goal status: INITIAL  3.  Pt and pt's family will describe and demonstrate at least 2 memory strategies for optimizing pt independence with certain ADL tasks (medication management) given rare min A. Baseline:  Goal status: INITIAL  4.  Pt will improve score on Communicative Effectiveness Survey, indicating speech is optimal for successful communication in different contexts.  Baseline: 11/24 Goal status: INITIAL   ASSESSMENT:  CLINICAL IMPRESSION: Patient is a 68 y.o. male who was seen today for evaluation of motor speech, dysphagia, and cognition. Pt presents with mild-moderate dysarthria characterized by consistent, imprecise articulation and reduced vocal loudness at the conversational level. Pt also presents with mild-moderate cognitive communication deficits primarily characterized by short-term memory impairment, executive function deficits, and impulsive/inattentive behavior. Pt has hx of oropharyngeal dysphagia and currently still experience difficulty with swallowing. According to EAT-10, pt notes that he experiences difficulty with both solids and liquids during swallowing. Recommend ST targeting conversation-level dysarthria tx and cognitive-communication tx focused on establishing external memory aids for optimizing pt communication effectiveness, safety, and independence. In addition, recommend swallow tx focused on pharyngeal strengthening exercises and aspiration precaution strategies to improve pt perception of swallowing difficulties.   OBJECTIVE IMPAIRMENTS: include attention, memory, executive functioning, dysarthria, and dysphagia. These impairments are limiting patient from managing medications, ADLs/IADLs, effectively communicating at home and in community, and safety when swallowing. Factors affecting potential to achieve goals and  functional outcome are N/A. Patient will benefit from skilled SLP services to address above impairments and improve overall function.  REHAB POTENTIAL: Good  PLAN:  SLP FREQUENCY: 2x/week  SLP DURATION: 12 weeks  PLANNED INTERVENTIONS: Aspiration precaution training, Pharyngeal strengthening exercises, Internal/external aids, Functional tasks, SLP instruction and feedback, Patient/family education, [phone] Treatment of speech (30 or 45 min) , and [id number] Treatment of swallowing function    [employer], CF-SLP 07/11/2024, 10:45 PM

## 2024-07-11 NOTE — Therapy (Signed)
 OUTPATIENT PHYSICAL THERAPY NEURO TREATMENT/ RE-CERT   Patient Name: Richard [name] MRN: [id number] DOB:December 05, 1955, 68 y.o., male Today's Date: 07/11/2024  PCP: Ezekiel Charleston, MD REFERRING PROVIDER: Wynne Flatten, MD  END OF SESSION:  PT End of Session - 07/11/24 0925     Visit Number 14    Number of Visits 27   re-cert   Date for Recertification  98/69/73   re-cert; due to potential delay in scheduling   Authorization Type [facility]    Authorization Time Period 05/13/24-09/10/24    Authorization - Number of Visits 15    Progress Note Due on Visit 10    PT Start Time 0928    PT Stop Time 1018    PT Time Calculation (min) 50 min    Equipment Utilized During Treatment Gait belt    Activity Tolerance Patient tolerated treatment well    Behavior During Therapy WFL for tasks assessed/performed          Past Medical History:  Diagnosis Date   Coronary artery disease    a. cath 04/02/2014 3v dx 80-90% in D1, 80-90% mid LAD, 95% apical LAD, 95% distal inferior LV branch of LCx, total occlusion of prox RCA  b). Cath 04/03/2014 DES to mid LAD, DES x3 to prox D1, DAPT indefinitely. LCx lesion untreated   GERD (gastroesophageal reflux disease)    HIV (human immunodeficiency virus infection) (HCC)    Hypertension    Ischemic cardiomyopathy 08/09/2013   EF 20-25 percent by echo in 04/2014, s/p [employer] [place] model [id number] serial number [id number]   Stroke [facility])    Past Surgical History:  Procedure Laterality Date   CARDIAC CATHETERIZATION  04/02/2014   DR BURNARD   ESOPHAGOGASTRODUODENOSCOPY (EGD) WITH PROPOFOL  N/A 09/08/2018   Procedure: ESOPHAGOGASTRODUODENOSCOPY (EGD) WITH PROPOFOL ;  Surgeon: Rollin Dover, MD;  Location: [facility] ENDOSCOPY;  Service: Endoscopy;  Laterality: N/A;   IMPLANTABLE CARDIOVERTER DEFIBRILLATOR IMPLANT N/A 09/10/2014   Procedure: IMPLANTABLE CARDIOVERTER DEFIBRILLATOR IMPLANT;  Surgeon: Jerel Balding, MD;  [employer] [place] model [id number] serial number  [id number]   IR CT HEAD LTD  12/01/2023   IR PERCUTANEOUS ART THROMBECTOMY/INFUSION INTRACRANIAL INC DIAG ANGIO  12/01/2023   IR US  GUIDE VASC ACCESS RIGHT  12/01/2023   LEFT AND RIGHT HEART CATHETERIZATION WITH CORONARY ANGIOGRAM N/A 04/02/2014   Procedure: LEFT AND RIGHT HEART CATHETERIZATION WITH CORONARY ANGIOGRAM;  Surgeon: Debby DELENA Burnard, MD;  Location: [facility] CATH LAB;  Service: Cardiovascular;  Laterality: N/A;   MEDIAL COLLATERAL LIGAMENT REPAIR, KNEE Right 08/25/2015   Procedure: RIGHT THUMB RADICAL COLLATERAL LIGAMENT REPAIR;  Surgeon: Donnice Robinsons, MD;  Location: [facility] OR;  Service: Orthopedics;  Laterality: Right;   PERCUTANEOUS CORONARY STENT INTERVENTION (PCI-S) N/A 04/03/2014   Procedure: PERCUTANEOUS CORONARY STENT INTERVENTION (PCI-S);  Surgeon: Peter M Jordan, MD;  Location: [facility] CATH LAB;  Service: Cardiovascular;  Laterality: N/A;   RADIOLOGY WITH ANESTHESIA N/A 12/01/2023   Procedure: RADIOLOGY WITH ANESTHESIA;  Surgeon: Radiologist, Medication, MD;  Location: [facility] OR;  Service: Radiology;  Laterality: N/A;  BETHANY BRADFORD DILATION N/A 09/08/2018   Procedure: SAVORY DILATION;  Surgeon: Rollin Dover, MD;  Location: [facility] ENDOSCOPY;  Service: Endoscopy;  Laterality: N/A;   Patient Active Problem List   Diagnosis Date Noted   ICD (implantable cardioverter-defibrillator) discharge 03/23/2024   Hyperlipidemia 01/02/2024   CKD stage 3a, GFR 45-59 ml/min (HCC) 01/02/2024   Depression with anxiety 12/27/2023   Chronic systolic congestive heart failure (HCC) 12/23/2023   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   Acute ischemic right MCA stroke (HCC) 12/01/2023   Schizophrenia, unspecified (HCC) 03/07/2023   Recurrent major depression 03/07/2023   Pseudophakia of left eye 03/07/2023   Acid reflux 03/07/2023   Dyspepsia 03/07/2023   Anxiety state 03/07/2023   Cortical age-related cataract of right eye 03/07/2023   Psychotic disorder (HCC) 03/07/2023   CAD (coronary artery  disease) 03/07/2023   Abdominal mass 07/12/2021   Peripheral vascular disease 02/04/2021   Overweight 01/21/2020   Dysphagia 12/04/2018   Hiatal hernia 04/19/2018   Gastroesophageal reflux disease 04/03/2018   Ischemic congestive cardiomyopathy (HCC) 10/17/2017   Generalized anxiety disorder 10/17/2017   Prediabetes 10/16/2017   Acquired thrombocytopenia 10/16/2017   Chronic kidney disease, stage 3 (HCC) 10/16/2017   Primary erectile dysfunction 10/13/2017   Chronic combined systolic and diastolic heart failure (HCC) 12/30/2015   Traumatic rupture of collateral ligament of other finger at metacarpophalangeal and interphalangeal joint, subsequent encounter 10/30/2015   Traumatic rupture of collateral ligament of other finger at metacarpophalangeal and interphalangeal joint, initial encounter 08/12/2015   VT (ventricular tachycardia) (HCC) 12/31/2014   ICD (implantable cardioverter-defibrillator) in place 09/10/2014   At risk for sudden cardiac death 2014-09-17   Systolic heart failure (HCC) 08/09/2014   Coronary artery disease involving native coronary artery of native heart without angina pectoris 04/04/2014   Unstable angina (HCC) 04/03/2014   Cardiomyopathy, ischemic 03/22/2014   History of CVA (cerebrovascular accident) 02/20/2014   Human immunodeficiency virus infection (HCC) 02/20/2014   Essential hypertension 02/20/2014   Hyperlipidemia LDL goal <70 02/20/2014   Depression 02/20/2014   Cerebral infarction (HCC) 08/09/2006    ONSET DATE: 05/15/24 referral    REFERRING DIAG: I63.9 (ICD-10-CM) - Cerebral infarction, unspecified   THERAPY DIAG:  Unsteadiness on feet - Plan: PT plan of care cert/re-cert  Abnormal posture - Plan: PT plan of care cert/re-cert  Difficulty in walking, not elsewhere classified - Plan: PT plan of care cert/re-cert  Muscle weakness (generalized) - Plan: PT plan of care cert/re-cert  Other abnormalities of gait and mobility - Plan: PT plan of care  cert/re-cert  Rationale for Evaluation and Treatment: Rehabilitation  SUBJECTIVE:                                                                                                                                                                                             SUBJECTIVE STATEMENT: Patient received from ST. Agreeable to re-cert. Denies falls.   Pt accompanied by: significant other, wife Veronica  PERTINENT HISTORY: CAD, GERD, HIV, HTN, ischemic cardiomyopathy, CVA  PAIN:  Are you having pain? No  PRECAUTIONS: Fall and  ICD/Pacemaker; L hemi   PATIENT GOALS: to walk and use my hand   OBJECTIVE:  Note: Objective measures were completed at Evaluation unless otherwise noted.  DIAGNOSTIC FINDINGS: 12/01/23 head CT IMPRESSION: 1. Subtle hypoattenuation involving the right caudate and right lentiform nuclei which may reflect acute infarct. 2. No acute intracranial hemorrhage. 3. Remote infarct involving the right frontal operculum, insula, and superior right temporal lobe. 4. Asymmetric density of the right M1 segment. Recommend correlation with CTA. 5. ASPECTS is 8   GAIT: Findings: Gait Characteristics: step to pattern, decreased arm swing- Left, decreased step length- Right, decreased stance time- Left, decreased stride length, decreased hip/knee flexion- Left, decreased ankle dorsiflexion- Left, circumduction- Left, Left hip hike, trunk rotated posterior- Left, trunk flexed, narrow BOS, and poor foot clearance- Left, Assistive device utilized:Hemi walker, and Level of assistance: CGA and Min A                                                                                                             TREATMENT   Theract: Vitals:   07/11/24 0932  BP: 101/61  Pulse: 72    [facility] PT Assessment - 07/11/24 0001       Standardized Balance Assessment   Five times sit to stand comments  24s R UE + CGA    10 Meter Walk .39m/s LBQC + CGA      Timed Up and Go Test    Normal TUG (seconds) 66   LBQC + CGA         NMR: -scifit hills level 5 x8 mins B LE, R UE for neural priming -sit <> stand with L LE biased on 2 box to reach for squigz placed on high mirror   -noted increased R compensatory weight shift, so switched box under R LE to encourage greater weight shift L   PATIENT EDUCATION: Education details: goal assessment,  PT POC, continue HEP Person educated: Patient and Spouse Education method: Explanation, Demonstration, Tactile cues, and Verbal cues Education comprehension: verbalized understanding, returned demonstration, verbal cues required, tactile cues required, and needs further education  HOME EXERCISE PROGRAM: Access Code: [email or web address] URL: [email or web address] Date: 06/06/2024 Prepared by: Delon Pop  Exercises - Supine Bridge  - 1 x daily - 7 x weekly - 3 sets - 10 reps - Supine March with Posterior Pelvic Tilt  - 1 x daily - 7 x weekly - 3 sets - 10 reps - Mini Squat with Counter Support  - 1 x daily - 7 x weekly - 3 sets - 8-10 reps - Standing Knee Flexion with Counter Support  - 1 x daily - 7 x weekly - 3 sets - 6-8 reps - Standing March with Counter Support  - 1 x daily - 7 x weekly - 3 sets - 8-10 reps  GOALS: Goals reviewed with patient? Yes  SHORT TERM GOALS: Target date: 06/22/24  Pt will be independent with initial HEP for improved functional strength and balance  Baseline: to be provided  Goal status: MET  2.  Pt will  improve gait speed to >/= .62m/s to demonstrate improved community ambulation  Baseline: .43m/s with HW + CGA; .39m/s with LBQC + CGA Goal status: MET  3.  Pt will improve TUG to </= 70 secs to demonstrated reduced fall risk  Baseline: 98s with HW + MinA; 79s LBQC + CGA Goal status: IN PROGRESS  4.  Pt will improve 5x STS to </= 22 sec to demo improved functional LE strength and balance   Baseline: 30.81 with CGA + R UE; 24/5s no UE + CGA Goal status: IN PRPOGRESS   LONG  TERM GOALS: Target date: 07/13/24 (to match cert date)   Pt will be independent with final HEP for improved functional strength and balance  Baseline: to be provided; provided Goal status: MET  Pt will improve gait speed to >/= .6m/s to demonstrate improved community ambulation  Baseline: .13m/s with HW + CGA; .90m/s LBQC + CGA Goal status: IN PROGRESS  Pt will improve TUG to </= 50 secs to demonstrated reduced fall risk  Baseline: 98s with HW + MinA; 66s LBQC + CGA Goal status: IN PROGRESS  Pt will improve 5x STS to </= 15 sec to demo improved functional LE strength and balance   Baseline: 30.81 with CGA + R UE; 24s R UE + CGA Goal status: IN PROGRESS  NEW SHORT TERM GOALS: 08/10/24 Pt will be compliant with updated HEP for improved functional strength and balance   Baseline: to be updated  Goal status: NEW   2. Pt will improve gait speed to >/= .25m/s to demonstrate improved community ambulation  Baseline: .21m/s LBQC + CGA Goal status: NEW  3. Pt will improve TUG to </= 50 secs to demonstrated reduced fall risk  Baseline: 66s LBQC + CGA Goal status: NEW  4. Pt will improve 5x STS to </= 20 sec to demo improved functional LE strength and balance   Baseline:  24s R UE + CGA Goal status: NEW   NEW LONG TERM GOALS: 09/07/24  Pt will be compliant with updated final HEP for improved functional strength and balance   Baseline: to be updated  Goal status: NEW  2. Pt will improve gait speed to >/= .10m/s to demonstrate improved community ambulation  Baseline: .1m/s with HW + CGA Goal status: NEW  3. Pt will improve TUG to </= 40 secs to demonstrated reduced fall risk  Baseline: 66s LBQC + CGA Goal status: NEW  4. Pt will improve 5x STS to </= 15 sec to demo improved functional LE strength and balance   Baseline: 24s R UE + CGA Goal status: NEW   ASSESSMENT:  CLINICAL IMPRESSION: Patient seen for skilled PT session with emphasis on goal assessment and re-cert. He  met 1/4 LTG, but is progressing well toward remaining goals. Five times Sit to Stand Test (FTSS) Method: Use a straight back chair with a solid seat that is 17-18" high. Ask participant to sit on the chair with arms folded across their chest.   Instructions: "Stand up and sit down as quickly as possible 5 times, keeping your arms folded across your chest."   Measurement: Stop timing when the participant touches the chair in sitting the 5th time.  TIME: 24 sec  Cut off scores indicative of increased fall risk: >12 sec CVA, >16 sec PD, >13 sec vestibular (ANPTA Core Set of Outcome Measures for Adults with Neurologic Conditions, 2018). 10 Meter Walk Test: Patient instructed to walk 10 meters (32.8 ft) as quickly and as safely as  possible at their normal speed x2 and at a fast speed x2. Time measured from 2 meter mark to 8 meter mark to accommodate ramp-up and ramp-down.  Normal speed: .105m/s Cut off scores: <0.4 m/s = household Ambulator, 0.4-0.8 m/s = limited community Ambulator, >0.8 m/s = community Ambulator, >1.2 m/s = crossing a street, <1.0 = increased fall risk MCID 0.05 m/s (small), 0.13 m/s (moderate), 0.06 m/s (significant)  (ANPTA Core Set of Outcome Measures for Adults with Neurologic Conditions, 2018). Patient completed the Timed Up and Go test (TUG) in 66 seconds.  Geriatrics: need for further assessment of fall risk: >= 12 sec; Recurrent falls: > 15 sec; Vestibular Disorders fall risk: > 15 sec; Parkinson's Disease fall risk: > 16 sec ([email or web address], 2023). He does continue to demonstrate a compensatory R lateral lean with minimal weight shift L, though this is improving. Remainder of exercises targeted increasing midline posture and L lateral weight shift as appropriate. He continues to benefit from skilled PT services to address the above mentioned deficits.    OBJECTIVE IMPAIRMENTS: Abnormal gait, decreased balance, decreased coordination, decreased endurance, decreased knowledge of  condition, decreased knowledge of use of DME, decreased ROM, decreased strength, impaired tone, impaired UE functional use, and postural dysfunction.   ACTIVITY LIMITATIONS: carrying, lifting, bending, standing, squatting, stairs, transfers, bed mobility, locomotion level, and caring for others  PARTICIPATION LIMITATIONS: meal prep, cleaning, interpersonal relationship, driving, shopping, community activity, occupation, and yard work  PERSONAL FACTORS: Age, Fitness, Past/current experiences, Time since onset of injury/illness/exacerbation, Transportation, and 3+ comorbidities: see above are also affecting patient's functional outcome.   REHAB POTENTIAL: Fair time since onset  CLINICAL DECISION MAKING: Stable/uncomplicated  EVALUATION COMPLEXITY: Low  PLAN:  PT FREQUENCY: 2x/week  PT DURATION: other: 7 weeks  PLANNED INTERVENTIONS: [id number]- PT Re-evaluation, 97750- Physical Performance Testing, 97110-Therapeutic exercises, 97530- Therapeutic activity, [id number]- Neuromuscular re-education, 97535- Self Care, [id number]- Manual therapy, [id number]- Gait training, [phone]- Orthotic Initial, [phone]- Orthotic/Prosthetic subsequent, [phone]- Aquatic Therapy, [phone] (1-2 muscles), [id number] (3+ muscles)- Dry Needling, Patient/Family education, Balance training, Stair training, Taping, Vestibular training, Visual/preceptual remediation/compensation, DME instructions, and Wheelchair mobility training  PLAN FOR NEXT SESSION: HIGT! Continue working with [facility] Continue use of L LE, weight shifting in bars with toe taps, improve L hip flexion strength. Facilitate left weight shifting during ambulation/ literally anything to get more weight on the left leg (modified SLS, R LE step ups so he has to spend more time on L, etc); staggered sit to stands   Delon DELENA Pop, PT Delon DELENA Pop, PT, DPT, CBIS, CFVRS  07/11/2024, 10:32 AM

## 2024-07-12 ENCOUNTER — Ambulatory Visit: Payer: Self-pay | Admitting: Cardiovascular Disease

## 2024-07-13 ENCOUNTER — Ambulatory Visit: Payer: Medicare Other

## 2024-07-13 NOTE — Progress Notes (Signed)
 Remote ICD Transmission

## 2024-07-16 ENCOUNTER — Ambulatory Visit: Admitting: Speech Pathology

## 2024-07-16 ENCOUNTER — Encounter: Payer: Self-pay | Admitting: Occupational Therapy

## 2024-07-16 ENCOUNTER — Ambulatory Visit: Admitting: Occupational Therapy

## 2024-07-16 ENCOUNTER — Other Ambulatory Visit: Payer: Self-pay | Admitting: Physician Assistant

## 2024-07-16 DIAGNOSIS — I69354 Hemiplegia and hemiparesis following cerebral infarction affecting left non-dominant side: Secondary | ICD-10-CM | POA: Diagnosis not present

## 2024-07-16 DIAGNOSIS — R471 Dysarthria and anarthria: Secondary | ICD-10-CM

## 2024-07-16 DIAGNOSIS — R1310 Dysphagia, unspecified: Secondary | ICD-10-CM

## 2024-07-16 DIAGNOSIS — R41841 Cognitive communication deficit: Secondary | ICD-10-CM

## 2024-07-16 DIAGNOSIS — R252 Cramp and spasm: Secondary | ICD-10-CM

## 2024-07-16 NOTE — Therapy (Signed)
 OUTPATIENT OCCUPATIONAL THERAPY NEURO TREATMENT  Patient Name: Richard [name] MRN: [id number] DOB:1956-04-29, 68 y.o., male Today's Date: 07/16/2024  PCP: [name], MD REFERRING PROVIDER: [name], MD  END OF SESSION:  OT End of Session - 07/16/24 1014     Visit Number 2    Number of Visits 15    Date for Recertification  09/08/24    Authorization Type VA approved 15 visits from 06/07/24 - 10/05/24    OT Start Time 1012    OT Stop Time 1100    OT Time Calculation (min) 48 min    Activity Tolerance Patient tolerated treatment well    Behavior During Therapy [facility] for tasks assessed/performed          Past Medical History:  Diagnosis Date   Coronary artery disease    a. cath 04/02/2014 3v dx 80-90% in D1, 80-90% mid LAD, 95% apical LAD, 95% distal inferior LV branch of LCx, total occlusion of prox RCA  b). Cath 04/03/2014 DES to mid LAD, DES x3 to prox D1, DAPT indefinitely. LCx lesion untreated   GERD (gastroesophageal reflux disease)    HIV (human immunodeficiency virus infection) (HCC)    Hypertension    Ischemic cardiomyopathy 08/09/2013   EF 20-25 percent by echo in 04/2014, s/p [employer] [place] model [id number] serial number [id number]   Stroke [facility])    Past Surgical History:  Procedure Laterality Date   CARDIAC CATHETERIZATION  04/02/2014   DR BURNARD   ESOPHAGOGASTRODUODENOSCOPY (EGD) WITH PROPOFOL  N/A 09/08/2018   Procedure: ESOPHAGOGASTRODUODENOSCOPY (EGD) WITH PROPOFOL ;  Surgeon: Rollin Dover, MD;  Location: [facility] END[facility]COPY;  Service: Endoscopy;  Laterality: N/A;   IMPLANTABLE CARDIOVERTER DEFIBRILLATOR IMPLANT N/A 09/10/2014   Procedure: IMPLANTABLE CARDIOVERTER DEFIBRILLATOR IMPLANT;  Surgeon: Jerel Balding, MD;  [facility] model [id number] serial number [id number]   IR CT HEAD LTD  12/01/2023   IR PERCUTANEOUS ART THROMBECTOMY/INFUSION INTRACRANIAL INC DIAG ANGIO  12/01/2023   IR US  GUIDE VASC ACCESS RIGHT  12/01/2023   LEFT AND RIGHT  HEART CATHETERIZATION WITH CORONARY ANGIOGRAM N/A 04/02/2014   Procedure: LEFT AND RIGHT HEART CATHETERIZATION WITH CORONARY ANGIOGRAM;  Surgeon: Debby DELENA Burnard, MD;  Location: [facility] CATH LAB;  Service: Cardiovascular;  Laterality: N/A;   MEDIAL COLLATERAL LIGAMENT REPAIR, KNEE Right 08/25/2015   Procedure: RIGHT THUMB RADICAL COLLATERAL LIGAMENT REPAIR;  Surgeon: Donnice Robinsons, MD;  Location: [facility] OR;  Service: Orthopedics;  Laterality: Right;   PERCUTANEOUS CORONARY STENT INTERVENTION (PCI-S) N/A 04/03/2014   Procedure: PERCUTANEOUS CORONARY STENT INTERVENTION (PCI-S);  Surgeon: Peter M Jordan, MD;  Location: [facility] CATH LAB;  Service: Cardiovascular;  Laterality: N/A;   RADIOLOGY WITH ANESTHESIA N/A 12/01/2023   Procedure: RADIOLOGY WITH ANESTHESIA;  Surgeon: Radiologist, Medication, MD;  Location: [facility] OR;  Service: Radiology;  Laterality: N/A;  BETHANY BRADFORD DILATION N/A 09/08/2018   Procedure: SAVORY DILATION;  Surgeon: Rollin Dover, MD;  Location: [facility];  Service: Endoscopy;  Laterality: N/A;   Patient Active Problem List   Diagnosis Date Noted   ICD (implantable cardioverter-defibrillator) discharge 03/23/2024   Hyperlipidemia 01/02/2024   CKD stage 3a, GFR 45-59 ml/min (HCC) 01/02/2024   Depression with anxiety 12/27/2023   Chronic systolic congestive heart failure (HCC) 12/23/2023   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   Acute ischemic right MCA stroke (HCC) 12/01/2023   Schizophrenia, unspecified (HCC) 03/07/2023   Recurrent major depression 03/07/2023   Pseudophakia of left eye 03/07/2023   Acid reflux 03/07/2023  Dyspepsia 03/07/2023   Anxiety state 03/07/2023   Cortical age-related cataract of right eye 03/07/2023   Psychotic disorder (HCC) 03/07/2023   CAD (coronary artery disease) 03/07/2023   Abdominal mass 07/12/2021   Peripheral vascular disease 02/04/2021   Overweight 01/21/2020   Dysphagia 12/04/2018   Hiatal hernia 04/19/2018   Gastroesophageal  reflux disease 04/03/2018   Ischemic congestive cardiomyopathy (HCC) 10/17/2017   Generalized anxiety disorder 10/17/2017   Prediabetes 10/16/2017   Acquired thrombocytopenia 10/16/2017   Chronic kidney disease, stage 3 (HCC) 10/16/2017   Primary erectile dysfunction 10/13/2017   Chronic combined systolic and diastolic heart failure (HCC) 12/30/2015   Traumatic rupture of collateral ligament of other finger at metacarpophalangeal and interphalangeal joint, subsequent encounter 10/30/2015   Traumatic rupture of collateral ligament of other finger at metacarpophalangeal and interphalangeal joint, initial encounter 08/12/2015   VT (ventricular tachycardia) (HCC) 12/31/2014   ICD (implantable cardioverter-defibrillator) in place 09/10/2014   At risk for sudden cardiac death 09-20-14   Systolic heart failure (HCC) 08/09/2014   Coronary artery disease involving native coronary artery of native heart without angina pectoris 04/04/2014   Unstable angina (HCC) 04/03/2014   Cardiomyopathy, ischemic 03/22/2014   History of CVA (cerebrovascular accident) 02/20/2014   Human immunodeficiency virus infection (HCC) 02/20/2014   Essential hypertension 02/20/2014   Hyperlipidemia LDL goal <70 02/20/2014   Depression 02/20/2014   Cerebral infarction (HCC) 08/09/2006    ONSET DATE: 06/19/2024  REFERRING DIAG: I63.9 (ICD-10-CM) - Cerebral infarction, unspecified  Note:  VA Auth# [id number] 06/07/24 - 10/05/24 15 OT visits  THERAPY DIAG:  Hemiplegia 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 CTA showed occlusion of the right M1 segment,  proximal M2 branch of the right MCA with intraluminal thrombus. S/p mechanical thrombectomy 4/24. CT showed subtle hypoattenuation involving the right caudate and right lentiform nuclei which may reflect acute infarct. MRI pending. PMH: CAD s/p CABG x3 2015, ischemic cardiomyopathy s/p ICD placement, HTN, HLD, HIV, GERD, CVA in 2008 without residual deficits  PRECAUTIONS: Fall and ICD/Pacemaker, no driving  WEIGHT BEARING RESTRICTIONS: No  PAIN:  Are you having pain? Lt hand fluctuates 5/10  FALLS: Has patient fallen in last 6 months? Yes. Number of falls 4; slid off sofa, fell off bed, nearly fell off commode into tub    LIVING ENVIRONMENT: Lives with: lives with their spouse Lives in: one level home Stairs: Yes: External: 1 steps; on right going up Has following equipment at home: Hemi walker, Wheelchair (manual), Shower bench, bed side commode, and Grab bars   PLOF: Independent  PATIENT GOALS: get out of this wheelchair  OBJECTIVE:  Note: Objective measures were completed at Evaluation unless otherwise noted.  HAND DOMINANCE: Right  ADLs:  Eating: assist to cut food Grooming: set up for brushing gums and implants, wife brushes dentures and shaves him (wife always shaved pt even prior to CVA) UB Dressing: mod to max assist LB Dressing: max assist Toileting: mod I  Bathing: mod assist - mostly seated Tub Shower transfers: min assist for leg management on Lt  Equipment: Transfer tub bench, Grab bars, and hand held shower  IADLs: dependent for all IADLS Handwriting: No changes  MOBILITY STATUS: uses w/c mostly, uses  hemi walker some at home   UPPER EXTREMITY ROM:  RUE AROM WNL's  LUE AROM dominated by synergy pattern (goes into some sh abduction, elbow flex, pronation but no isolated movement) No active movement in hand Pt can only tolerate approx 75% passive shoulder flexion   UPPER EXTREMITY MMT:   NT d/t spasticity   HAND FUNCTION: No functional use LT  hand  COORDINATION: No functional use Lt hand  SENSATION: WFL  EDEMA: mild Lt hand  MUSCLE TONE: LUE: Moderate, Hypertonic, and Modifed Ashworth Scale 3 = Considerable increase in muscle tone, passive movement difficult at elbow, 2/4 wrist and fingers  COGNITION: Overall cognitive status: Impaired, decreased memory Wife reports he sometimes doesn't turn the sink off after washing hands  VISION: Subjective report: about the same to me. Got new glasses Baseline vision: Wears glasses all the time but not wearing at evaluation Visual history: cataract sx OU  VISION ASSESSMENT: Not tested    PERCEPTION: Not tested  PRAXIS: Not tested  OBSERVATIONS: dysarthria of speech, reports getting choked especially on water and saliva                                                                                                                             TREATMENT DATE: 07/16/24  Fabricated and fitted resting hand splint and issued. Reviewed splint wear and care with pt and wife  Reviewed initial HEP for LUE including proper hand placement for self ROM ex's and caregiver assisted ex's     PATIENT EDUCATION: Education details: initial LUE HEP review, splint wear and care  Person educated: Patient and Spouse Education method: Explanation, Demonstration, and Verbal cues Education comprehension: verbalized understanding, verbal cues required, and needs further education  HOME EXERCISE PROGRAM: 07/09/24: Initial LUE HEP    GOALS: Goals reviewed with patient? Yes  SHORT TERM GOALS: Target date: 08/09/24  Independent with HEP for LUE Baseline: Goal status: IN PROGRESS  2.  Independent with splint wear and care for Lt hand/wrist (resting hand splint) Baseline:  Goal status: IN PROGRESS  3.  Pt/family to verbalize understanding with hemi dressing/bathing techniques and A/E to increase independence with ADLS Baseline:  Goal status: INITIAL  4.  Pt to verbalize understanding  with memory compensation strategies  Baseline:  Goal status: INITIAL   LONG TERM GOALS: Target date: 09/08/24  Independent with updated HEP  Baseline:  Goal status: INITIAL  2.  Pt to consistently perform dressing and bathing with no more than min assist Baseline:  Goal status: INITIAL  3.  Pt to use LUE as stabilizer at least 25% of the time for ADLS/bilateral tasks Baseline:  Goal status: INITIAL  4.  Pt to cut food with A/E (rocker knife)  Baseline:  Goal status: INITIAL  5.  Pt to id 3 pain management strategies for Lt hand I'ly Baseline:  Goal status: INITIAL   ASSESSMENT:  CLINICAL IMPRESSION: Patient is a 68 y.o. male who was seen today  for occupational therapy treatment for CVA w/ Lt hemiparesis in April 2025. Pt issued splint today. Hx includes CAD s/p CABG x3 2015, ischemic cardiomyopathy s/p ICD placement, HTN, HLD, HIV, GERD, CVA in 2008 without residual deficits. Patient currently presents below baseline level of functioning demonstrating functional deficits and impairments as noted below. Pt would benefit from skilled OT services in the outpatient setting to work on impairments as noted below to help pt return to PLOF as able.   SABRA   PERFORMANCE DEFICITS: in functional skills including ADLs, IADLs, coordination, edema, tone, ROM, strength, pain, Fine motor control, Gross motor control, mobility, body mechanics, decreased knowledge of precautions, decreased knowledge of use of DME, and UE functional use, cognitive skills including memory.   IMPAIRMENTS: are limiting patient from ADLs, IADLs, rest and sleep, leisure, and social participation.   CO-MORBIDITIES: may have co-morbidities  that affects occupational performance. Patient will benefit from skilled OT to address above impairments and improve overall function.  MODIFICATION OR ASSISTANCE TO COMPLETE EVALUATION: No modification of tasks or assist necessary to complete an evaluation.  OT OCCUPATIONAL PROFILE  AND HISTORY: Detailed assessment: Review of records and additional review of physical, cognitive, psychosocial history related to current functional performance.  CLINICAL DECISION MAKING: Moderate - several treatment options, min-mod task modification necessary  REHAB POTENTIAL: Good  EVALUATION COMPLEXITY: Moderate    PLAN:  OT FREQUENCY: 2x/week  OT DURATION: 8 weeks  PLANNED INTERVENTIONS: 97535 self care/ADL training, 02889 therapeutic exercise, 97530 therapeutic activity, 97112 neuromuscular re-education, 97140 manual therapy, 97113 aquatic therapy, 97018 paraffin, 02960 fluidotherapy, 97010 moist heat, 97010 cryotherapy, 97760 Orthotic Initial, 97763 Orthotic/Prosthetic subsequent, passive range of motion, functional mobility training, visual/perceptual remediation/compensation, energy conservation, patient/family education, and DME and/or AE instructions  RECOMMENDED OTHER SERVICES: none at this time  CONSULTED AND AGREED WITH PLAN OF CARE: Patient and family member/caregiver  PLAN FOR NEXT SESSION: Splint adjustments prn, ADLS (practice dressing with hemi techniques) and A/E recommendation, if time - memory compensation strategies  *NO estim d/t defibrillator   Burnard JINNY Roads, OT 07/16/2024, 10:15 AM

## 2024-07-16 NOTE — Patient Instructions (Signed)
     Oral exercises/Swallow exercises  Use the mirror - Do 15x each twice a day   Stick out your tongue and curl tongue up to your nose  Scrape the roof of your mouth from your teeth to your throat   Move the tongue around your top and bottom teeth in a circle, clockwise, then counter clockwise   Push out each cheek with your tongue, alternate side to side - push in with your finger to add resistance  Hard swallows  Tongue out and swallow   Quran Biehn  September 28, 2057  Practice you address with your clear voice  Your social  Address  Cell number  I'll have the ribs with fries and iced tea please  Some fried chicken please  Some meatloaf with mac and cheese please  I'd like some banana pudding  Say each word 3x then put it in a sentence

## 2024-07-16 NOTE — Therapy (Signed)
 OUTPATIENT SPEECH LANGUAGE PATHOLOGY TREATMENT   Patient Name: Richard [name] MRN: [id number] DOB:[birth year], 68 y.o., male Today's Date: 07/16/2024  PCP: [name], MD REFERRING PROVIDER: [name], MD  END OF SESSION:  End of Session - 07/16/24 1146     Visit Number 2    Number of Visits 25    Date for Recertification  10/03/24    SLP Start Time 1100    SLP Stop Time  1145    SLP Time Calculation (min) 45 min    Activity Tolerance Patient tolerated treatment well           Past Medical History:  Diagnosis Date   Coronary artery disease    a. cath 04/02/2014 3v dx 80-90% in D1, 80-90% mid LAD, 95% apical LAD, 95% distal inferior LV branch of LCx, total occlusion of prox RCA  b). Cath 04/03/2014 DES to mid LAD, DES x3 to prox D1, DAPT indefinitely. LCx lesion untreated   GERD (gastroesophageal reflux disease)    HIV (human immunodeficiency virus infection) (HCC)    Hypertension    Ischemic cardiomyopathy 08/09/2013   EF 20-25 percent by echo in 04/2014, s/p [employer] [place] model [id number] serial number [id number]   Stroke [facility])    Past Surgical History:  Procedure Laterality Date   CARDIAC CATHETERIZATION  04/02/2014   DR BURNARD   ESOPHAGOGASTRODUODENOSCOPY (EGD) WITH PROPOFOL  N/A 09/08/2018   Procedure: ESOPHAGOGASTRODUODENOSCOPY (EGD) WITH PROPOFOL ;  Surgeon: Rollin Dover, MD;  Location: [facility] ENDOSCOPY;  Service: Endoscopy;  Laterality: N/A;   IMPLANTABLE CARDIOVERTER DEFIBRILLATOR IMPLANT N/A 09/10/2014   Procedure: IMPLANTABLE CARDIOVERTER DEFIBRILLATOR IMPLANT;  Surgeon: Jerel Balding, MD;  [facility] [place] model [id number] serial number [id number]   IR CT HEAD LTD  12/01/2023   IR PERCUTANEOUS ART THROMBECTOMY/INFUSION INTRACRANIAL INC DIAG ANGIO  12/01/2023   IR US  GUIDE VASC ACCESS RIGHT  12/01/2023   LEFT AND RIGHT HEART CATHETERIZATION WITH CORONARY ANGIOGRAM N/A 04/02/2014   Procedure: LEFT AND RIGHT HEART CATHETERIZATION WITH CORONARY  ANGIOGRAM;  Surgeon: Debby DELENA Burnard, MD;  Location: [facility] CATH LAB;  Service: Cardiovascular;  Laterality: N/A;   MEDIAL COLLATERAL LIGAMENT REPAIR, KNEE Right 08/25/2015   Procedure: RIGHT THUMB RADICAL COLLATERAL LIGAMENT REPAIR;  Surgeon: Donnice Robinsons, MD;  Location: [facility] OR;  Service: Orthopedics;  Laterality: Right;   PERCUTANEOUS CORONARY STENT INTERVENTION (PCI-S) N/A 04/03/2014   Procedure: PERCUTANEOUS CORONARY STENT INTERVENTION (PCI-S);  Surgeon: Peter M Jordan, MD;  Location: [facility] CATH LAB;  Service: Cardiovascular;  Laterality: N/A;   RADIOLOGY WITH ANESTHESIA N/A 12/01/2023   Procedure: RADIOLOGY WITH ANESTHESIA;  Surgeon: Radiologist, Medication, MD;  Location: [facility] OR;  Service: Radiology;  Laterality: N/A;  BETHANY BRADFORD DILATION N/A 09/08/2018   Procedure: SAVORY DILATION;  Surgeon: Rollin Dover, MD;  Location: [facility] ENDOS[facility];  Service: Endoscopy;  Laterality: N/A;   Patient Active Problem List   Diagnosis Date Noted   ICD (implantable cardioverter-defibrillator) discharge 03/23/2024   Hyperlipidemia 01/02/2024   CKD stage 3a, GFR 45-59 ml/min (HCC) 01/02/2024   Depression with anxiety 12/27/2023   Chronic systolic congestive heart failure (HCC) 12/23/2023   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   Acute ischemic right MCA stroke (HCC) 12/01/2023   Schizophrenia, unspecified (HCC) 03/07/2023   Recurrent major depression 03/07/2023   Pseudophakia of left eye 03/07/2023   Acid reflux 03/07/2023   Dyspepsia 03/07/2023   Anxiety state 03/07/2023   Cortical age-related cataract of right eye 03/07/2023   Psychotic disorder (HCC)  03/07/2023   CAD (coronary artery disease) 03/07/2023   Abdominal mass 07/12/2021   Peripheral vascular disease 02/04/2021   Overweight 01/21/2020   Dysphagia 12/04/2018   Hiatal hernia 04/19/2018   Gastroesophageal reflux disease 04/03/2018   Ischemic congestive cardiomyopathy (HCC) 10/17/2017   Generalized anxiety disorder 10/17/2017    Prediabetes 10/16/2017   Acquired thrombocytopenia 10/16/2017   Chronic kidney disease, stage 3 (HCC) 10/16/2017   Primary erectile dysfunction 10/13/2017   Chronic combined systolic and diastolic heart failure (HCC) 12/30/2015   Traumatic rupture of collateral ligament of other finger at metacarpophalangeal and interphalangeal joint, subsequent encounter 10/30/2015   Traumatic rupture of collateral ligament of other finger at metacarpophalangeal and interphalangeal joint, initial encounter 08/12/2015   VT (ventricular tachycardia) (HCC) 12/31/2014   ICD (implantable cardioverter-defibrillator) in place 09/10/2014   At risk for sudden cardiac death 09-11-2014   Systolic heart failure (HCC) 08/09/2014   Coronary artery disease involving native coronary artery of native heart without angina pectoris 04/04/2014   Unstable angina (HCC) 04/03/2014   Cardiomyopathy, ischemic 03/22/2014   History of CVA (cerebrovascular accident) 02/20/2014   Human immunodeficiency virus infection (HCC) 02/20/2014   Essential hypertension 02/20/2014   Hyperlipidemia LDL goal <70 02/20/2014   Depression 02/20/2014   Cerebral infarction (HCC) 08/09/2006    ONSET DATE: 11/2023  REFERRING DIAG: I63.9 (ICD-10-CM) - Cerebral infarction, unspecified  THERAPY DIAG:  Dysarthria and anarthria  Dysphagia, unspecified type  Cognitive communication deficit  Rationale for Evaluation and Treatment: Rehabilitation  SUBJECTIVE:   SUBJECTIVE STATEMENT: It irritates something here in my throat, and I start coughing. Pt accompanied by: self and significant other  PERTINENT HISTORY: Pt is a 68 y.o. male who has a recent hx of CVA back in April, 2025. Pt noted residual deficits include dysarthria, cognitive-communication deficits and dysphagia. Pt's wife was present and provided additional hx. Pt's wife notices that pt's speech is low in volume during conversations. Pt recently received HH ST, primarily targeting  dysarthria at sentence and conversation levels. Pt also received ST in CIR 11/2023-12/2023, targeting swallowing, dysarthria, and cognition. Pt currently consumes regular/thin-liquid diet. Pt endorses coughing and choking on secretions on occasion. Pt's wife handles household responsibilities (finances, medication management, etc.). Pt's wife states that pt tends to lose focus during longer tasks. Pt enjoys watching sports at home. PMHx: CAD, GERD, HIV, HTN, ischemic cardiomyopathy, CVA.  PAIN:  Are you having pain? No  FALLS: Has patient fallen in last 6 months?  See PT evaluation for details  LIVING ENVIRONMENT: Lives with: lives with their family and lives with their spouse Lives in: House/apartment  PLOF:  Level of assistance: Comment: Independent with driving and medication management; wife still did finances  Employment: On disability  PATIENT GOALS: To improve thinking skills, swallowing, and speech.  OBJECTIVE:  Note: Objective measures were completed at Evaluation unless otherwise noted.  DIAGNOSTIC FINDINGS: MBSS from 12/08/23 Patient presents with mild oropharyngeal dysphagia. Oral phase is characterized by reduced lingual movement and labial seal resulting in anterior and posterior spillage. Patient independently utilized multiple swallows and digital sweep when necessary to clear oral residuals. Pharyngeal phase is remarkable for reduced epiglottic inversion and pharyngeal stripping wave resulting in mild pharyngeal residuals, mainly present in the vallecula and pyriform sinuses. Penetration present x2 with thin liquids (x1 via straw, x1 during liquid wash), though likely due to premature spillage and mistiming of epiglottic inversion and laryngeal vestibular closure. Pill consumed in puree, requiring additional bites of PO due to suspected esophageal retention. Recommend  continuation of D2/thin liquid diet with small, single sips of liquids. Patient should continue to utilize  digital sweep to clear buccal pocketing of solids. Recommend medications administered whole in puree with additional bites to promote esophogeal clearance.     COGNITION: Overall cognitive status: Impaired Areas of impairment:  Attention: Impaired: Selective Memory: Impaired: Immediate Working Short term Psychologist, Educational function: Impaired: Impulse control, Organization, Planning, and Error awareness Behavior: Impulsive Functional deficits: Medication management, time management.     MOTOR SPEECH: Overall motor speech: impaired Level of impairment: Conversation Respiration: thoracic breathing and diaphragmatic/abdominal breathing Phonation: normal and low vocal intensity Resonance: WFL Articulation: Impaired: conversation Intelligibility: Intelligibility reduced Motor planning: Appears intact Motor speech errors: aware and consistent Interfering components: N/A Effective technique: increased vocal intensity and pacing  ORAL MOTOR EXAMINATION: Overall status: Impaired:   Lingual: Left (Symmetry and Strength) Comments: N/A   STANDARDIZED ASSESSMENTS: SLUMS: 20/30  PATIENT REPORTED OUTCOME MEASURES (PROM): EAT-10: 20/36 and Communication Effectiveness Survey: 11/24                                                                                                                            TREATMENT DATE:   07/16/24: Dysphagia: Initiated HEP for dysphagia and lingual weakness. See Patient Instructions. Lingual elevation and lateralization with resistance exercises completed 15/15x with occasional min modeling and verbal cues. Effortful swallows and [place] completed 10/10x with occasional min A. Targeted more frequent swallowing and swallowing prior to speaking to reduce choking on saliva. With usual verbal cues, Richard [name] demonstrated swallow prior to speaking on structured speech task. He required frequent cues to swallow prior to speaking in conversation. Introduced concept of swallowing  with intent, paying attention when eating and drinking  Dysarthria: Targeted compensations for dysarthria in structured speech task generating 3 sentence descriptions - Volume remained WNL with rare min A. Slow rate, over articulation and separating syllables required occasional min modeling and verbal cues. Richard [name] required cueing to ID and correct slurred/unintelligible utterances 3/3x initially. Targeted 3 syllable words - using separate syllables at slow rate to compensate for dysarthria generating sentences with these words -After initial modeling and instruction, Richard [name] was intelligible in sentences with 3 syllable words 12/12x. Generated list of personally relevant phrases to practice including biographical information he uses with doctors' offices, and most frequent meal orders. At the end of session, Richard [name] Id'd and self correct imprecise speech in conversation with spouse with mod I.  07/11/24: Evaluation completed. No tx was conducted on this date.    PATIENT EDUCATION: Education details: Evaluation results/goal setting. Person educated: Patient and Spouse Education method: Explanation Education comprehension: verbalized understanding   GOALS: Goals reviewed with patient? Yes  SHORT TERM GOALS: Target date: 08/15/2024  Pt will achieve average >70 dB during 8 minute conversation given occasional min A. Baseline: Goal status: ONGOING  2.  Pt will perform pharyngeal and lingual strengthening exercises with 90% consistency when given rare min A. Baseline:  Goal status: ONGOING  3.  Pt and pt's family will utilize at least 1 external memory strategy for maximizing attention during medication management given occasional min A.  Baseline:  Goal status: ONGOING  4.  Pt will display use of aspiration precautions during PO trials of regular/thin-liquids in 90% of opportunities given rare min A Baseline:  Goal status: ONGOING  5.  Pt will achieve 95-97% speech intelligibility during  8 minute conversation with familiar listeners given occasional min A.  Baseline:  Goal status: ONGOING   LONG TERM GOALS: Target date: 10/03/2024  Pt will achieve average >70 dB during a 12 minute conversation given rare min A.  Baseline:  Goal status: ONGOING  2.  Pt will reduce score on EAT-10 PROM, indicating improved self-perception of swallowing function.  Baseline: 20/36 Goal status: ONGOING  3.  Pt and pt's family will describe and demonstrate at least 2 memory strategies for optimizing pt independence with certain ADL tasks (medication management) given rare min A. Baseline:  Goal status: ONGOING  4.  Pt will improve score on Communicative Effectiveness Survey, indicating speech is optimal for successful communication in different contexts.  Baseline: 11/24 Goal status: ONGOING   ASSESSMENT:  CLINICAL IMPRESSION: Patient is a 68 y.o. male who was seen today for evaluation of motor speech, dysphagia, and cognition. Pt presents with mild-moderate dysarthria characterized by consistent, imprecise articulation and reduced vocal loudness at the conversational level. Pt also presents with mild-moderate cognitive communication deficits primarily characterized by short-term memory impairment, executive function deficits, and impulsive/inattentive behavior. Pt has hx of oropharyngeal dysphagia and currently still experience difficulty with swallowing. According to EAT-10, pt notes that he experiences difficulty with both solids and liquids during swallowing. Recommend ST targeting conversation-level dysarthria tx and cognitive-communication tx focused on establishing external memory aids for optimizing pt communication effectiveness, safety, and independence. In addition, recommend swallow tx focused on pharyngeal strengthening exercises and aspiration precaution strategies to improve pt perception of swallowing difficulties.   OBJECTIVE IMPAIRMENTS: include attention, memory, executive  functioning, dysarthria, and dysphagia. These impairments are limiting patient from managing medications, ADLs/IADLs, effectively communicating at home and in community, and safety when swallowing. Factors affecting potential to achieve goals and functional outcome are N/A. Patient will benefit from skilled SLP services to address above impairments and improve overall function.  REHAB POTENTIAL: Good  PLAN:  SLP FREQUENCY: 2x/week  SLP DURATION: 12 weeks  PLANNED INTERVENTIONS: Aspiration precaution training, Pharyngeal strengthening exercises, Internal/external aids, Functional tasks, SLP instruction and feedback, Patient/family education, [phone] Treatment of speech (30 or 45 min) , and [id number] Treatment of swallowing function    Richard [name] Music, CF-SLP 07/16/2024, 12:05 PM

## 2024-07-18 ENCOUNTER — Ambulatory Visit: Admitting: Occupational Therapy

## 2024-07-18 ENCOUNTER — Ambulatory Visit

## 2024-07-18 ENCOUNTER — Encounter: Payer: Self-pay | Admitting: Occupational Therapy

## 2024-07-18 DIAGNOSIS — I69354 Hemiplegia and hemiparesis following cerebral infarction affecting left non-dominant side: Secondary | ICD-10-CM | POA: Diagnosis not present

## 2024-07-18 DIAGNOSIS — I69398 Other sequelae of cerebral infarction: Secondary | ICD-10-CM

## 2024-07-18 NOTE — Therapy (Signed)
 OUTPATIENT OCCUPATIONAL THERAPY NEURO TREATMENT  Patient Name: Richard [name] MRN: [id number] DOB:August 15, 1955, 68 y.o., male Today's Date: 07/18/2024  PCP: [name], MD REFERRING PROVIDER: [name], MD  END OF SESSION:  OT End of Session - 07/18/24 1025     Visit Number 3    Number of Visits 15    Date for Recertification  09/08/24    Authorization Type VA approved 15 visits from 06/07/24 - 10/05/24    OT Start Time 0935    OT Stop Time 1020    OT Time Calculation (min) 45 min    Activity Tolerance Patient tolerated treatment well    Behavior During Therapy [facility] for tasks assessed/performed          Past Medical History:  Diagnosis Date   Coronary artery disease    a. cath 04/02/2014 3v dx 80-90% in D1, 80-90% mid LAD, 95% apical LAD, 95% distal inferior LV branch of LCx, total occlusion of prox RCA  b). Cath 04/03/2014 DES to mid LAD, DES x3 to prox D1, DAPT indefinitely. LCx lesion untreated   GERD (gastroesophageal reflux disease)    HIV (human immunodeficiency virus infection) (HCC)    Hypertension    Ischemic cardiomyopathy 08/09/2013   EF 20-25 percent by echo in 04/2014, s/p [employer] model [id number] serial number [id number]   Stroke [facility])    Past Surgical History:  Procedure Laterality Date   CARDIAC CATHETERIZATION  04/02/2014   DR BURNARD   ESOPHAGOGASTRODUODENOSCOPY (EGD) WITH PROPOFOL  N/A 09/08/2018   Procedure: ESOPHAGOGASTRODUODENOSCOPY (EGD) WITH PROPOFOL ;  Surgeon: Rollin Dover, MD;  Location: [facility] ENDOSCOPY;  Service: Endoscopy;  Laterality: N/A;   IMPLANTABLE CARDIOVERTER DEFIBRILLATOR IMPLANT N/A 09/10/2014   Procedure: IMPLANTABLE CARDIOVERTER DEFIBRILLATOR IMPLANT;  Surgeon: Jerel Balding, MD;  [facility] model [id number] serial number [id number]   IR CT HEAD LTD  12/01/2023   IR PERCUTANEOUS ART THROMBECTOMY/INFUSION INTRACRANIAL INC DIAG ANGIO  12/01/2023   IR US  GUIDE VASC ACCESS RIGHT  12/01/2023   LEFT AND RIGHT  HEART CATHETERIZATION WITH CORONARY ANGIOGRAM N/A 04/02/2014   Procedure: LEFT AND RIGHT HEART CATHETERIZATION WITH CORONARY ANGIOGRAM;  Surgeon: Debby DELENA Burnard, MD;  Location: [facility] CATH LAB;  Service: Cardiovascular;  Laterality: N/A;   MEDIAL COLLATERAL LIGAMENT REPAIR, KNEE Right 08/25/2015   Procedure: RIGHT THUMB RADICAL COLLATERAL LIGAMENT REPAIR;  Surgeon: Donnice Robinsons, MD;  Location: [facility] OR;  Service: Orthopedics;  Laterality: Right;   PERCUTANEOUS CORONARY STENT INTERVENTION (PCI-S) N/A 04/03/2014   Procedure: PERCUTANEOUS CORONARY STENT INTERVENTION (PCI-S);  Surgeon: Peter M Jordan, MD;  Location: [facility] CATH LAB;  Service: Cardiovascular;  Laterality: N/A;   RADIOLOGY WITH ANESTHESIA N/A 12/01/2023   Procedure: RADIOLOGY WITH ANESTHESIA;  Surgeon: Radiologist, Medication, MD;  Location: [facility] OR;  Service: Radiology;  Laterality: N/A;  BETHANY BRADFORD DILATION N/A 09/08/2018   Procedure: SAVORY DILATION;  Surgeon: Rollin Dover, MD;  Location: [facility] ENDOSCOPY;  Service: Endoscopy;  Laterality: N/A;   Patient Active Problem List   Diagnosis Date Noted   ICD (implantable cardioverter-defibrillator) discharge 03/23/2024   Hyperlipidemia 01/02/2024   CKD stage 3a, GFR 45-59 ml/min (HCC) 01/02/2024   Depression with anxiety 12/27/2023   Chronic systolic congestive heart failure (HCC) 12/23/2023   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   Acute ischemic right MCA stroke (HCC) 12/01/2023   Schizophrenia, unspecified (HCC) 03/07/2023   Recurrent major depression 03/07/2023   Pseudophakia of left eye 03/07/2023   Acid reflux 03/07/2023  Dyspepsia 03/07/2023   Anxiety state 03/07/2023   Cortical age-related cataract of right eye 03/07/2023   Psychotic disorder (HCC) 03/07/2023   CAD (coronary artery disease) 03/07/2023   Abdominal mass 07/12/2021   Peripheral vascular disease 02/04/2021   Overweight 01/21/2020   Dysphagia 12/04/2018   Hiatal hernia 04/19/2018   Gastroesophageal  reflux disease 04/03/2018   Ischemic congestive cardiomyopathy (HCC) 10/17/2017   Generalized anxiety disorder 10/17/2017   Prediabetes 10/16/2017   Acquired thrombocytopenia 10/16/2017   Chronic kidney disease, stage 3 (HCC) 10/16/2017   Primary erectile dysfunction 10/13/2017   Chronic combined systolic and diastolic heart failure (HCC) 12/30/2015   Traumatic rupture of collateral ligament of other finger at metacarpophalangeal and interphalangeal joint, subsequent encounter 10/30/2015   Traumatic rupture of collateral ligament of other finger at metacarpophalangeal and interphalangeal joint, initial encounter 08/12/2015   VT (ventricular tachycardia) (HCC) 12/31/2014   ICD (implantable cardioverter-defibrillator) in place 09/10/2014   At risk for sudden cardiac death 09/21/14   Systolic heart failure (HCC) 08/09/2014   Coronary artery disease involving native coronary artery of native heart without angina pectoris 04/04/2014   Unstable angina (HCC) 04/03/2014   Cardiomyopathy, ischemic 03/22/2014   History of CVA (cerebrovascular accident) 02/20/2014   Human immunodeficiency virus infection (HCC) 02/20/2014   Essential hypertension 02/20/2014   Hyperlipidemia LDL goal <70 02/20/2014   Depression 02/20/2014   Cerebral infarction (HCC) 08/09/2006    ONSET DATE: 06/19/2024  REFERRING DIAG: I63.9 (ICD-10-CM) - Cerebral infarction, unspecified  Note:  [employer] Auth# [id number] 06/07/24 - 10/05/24 15 OT visits  THERAPY DIAG:  Hemiplegia and hemiparesis following cerebral infarction affecting left non-dominant side (HCC)  Spasticity as late effect of cerebrovascular accident (CVA)  Rationale for Evaluation and Treatment: Rehabilitation  SUBJECTIVE:   SUBJECTIVE STATEMENT: Pt reports pain Lt hand - the same. I wore all night last night (splint)  Pt accompanied by: significant other (wife)   PERTINENT HISTORY: presented 12/01/23 with L-sided weakness and slurred speech. CTA  showed occlusion of the right M1 segment, proximal M2 branch of the right MCA with intraluminal thrombus. S/p mechanical thrombectomy 4/24. CT showed subtle hypoattenuation involving the right caudate and right lentiform nuclei which may reflect acute infarct. MRI pending. PMH: CAD s/p CABG x3 2015, ischemic cardiomyopathy s/p ICD placement, HTN, HLD, HIV, GERD, CVA in 2008 without residual deficits  PRECAUTIONS: Fall and ICD/Pacemaker, no driving  WEIGHT BEARING RESTRICTIONS: No  PAIN:  Are you having pain? Lt hand fluctuates 5/10  FALLS: Has patient fallen in last 6 months? Yes. Number of falls 4; slid off sofa, fell off bed, nearly fell off commode into tub    LIVING ENVIRONMENT: Lives with: lives with their spouse Lives in: one level home Stairs: Yes: External: 1 steps; on right going up Has following equipment at home: Hemi walker, Wheelchair (manual), Shower bench, bed side commode, and Grab bars   PLOF: Independent  PATIENT GOALS: get out of this wheelchair  OBJECTIVE:  Note: Objective measures were completed at Evaluation unless otherwise noted.  HAND DOMINANCE: Right  ADLs:  Eating: assist to cut food Grooming: set up for brushing gums and implants, wife brushes dentures and shaves him (wife always shaved pt even prior to CVA) UB Dressing: mod to max assist LB Dressing: max assist Toileting: mod I  Bathing: mod assist - mostly seated Tub Shower transfers: min assist for leg management on Lt  Equipment: Transfer tub bench, Grab bars, and hand held shower  IADLs: dependent for all IADLS Handwriting: No  changes  MOBILITY STATUS: uses w/c mostly, uses hemi walker some at home   UPPER EXTREMITY ROM:  RUE AROM WNL's  LUE AROM dominated by synergy pattern (goes into some sh abduction, elbow flex, pronation but no isolated movement) No active movement in hand Pt can only tolerate approx 75% passive shoulder flexion   UPPER EXTREMITY MMT:   NT d/t spasticity   HAND  FUNCTION: No functional use LT hand  COORDINATION: No functional use Lt hand  SENSATION: WFL  EDEMA: mild Lt hand  MUSCLE TONE: LUE: Moderate, Hypertonic, and Modifed Ashworth Scale 3 = Considerable increase in muscle tone, passive movement difficult at elbow, 2/4 wrist and fingers  COGNITION: Overall cognitive status: Impaired, decreased memory Wife reports he sometimes doesn't turn the sink off after washing hands  VISION: Subjective report: about the same to me. Got new glasses Baseline vision: Wears glasses all the time but not wearing at evaluation Visual history: cataract sx OU  VISION ASSESSMENT: Not tested    PERCEPTION: Not tested  PRAXIS: Not tested  OBSERVATIONS: dysarthria of speech, reports getting choked especially on water and saliva                                                                                                                             TREATMENT DATE: 07/18/24  Splint doing well and pt was able to wear last night without problems/concerns.  Cut down splint slightly at fingers for better fit  Pt shown hemi techniques for donning/doffing shirt - pt able to do with mod cueing and min assist to don shirt, and mod cues to doff shirt Pt/wife also shown hemi techniques to don/doff shoes and socks and shown A/E including: foot stool, shoe buttons, and use of LH shoe horn. Wife reports they have a shoe horn. Pt with max cues and practice required to don sock one handed but did eventually complete on Rt side. Pt will need some assist to don Lt shoe and sock but can doff I'ly.   Pt provided handouts on shoe lace options including: shoe buttons and rocker knife (to practice latter next session)      PATIENT EDUCATION: Education details: initial LUE HEP review, splint wear and care  Person educated: Patient and Spouse Education method: Explanation, Demonstration, and Verbal cues Education comprehension: verbalized understanding, verbal cues  required, and needs further education  HOME EXERCISE PROGRAM: 07/09/24: Initial LUE HEP    GOALS: Goals reviewed with patient? Yes  SHORT TERM GOALS: Target date: 08/09/24  Independent with HEP for LUE Baseline: Goal status: IN PROGRESS  2.  Independent with splint wear and care for Lt hand/wrist (resting hand splint) Baseline:  Goal status: IN PROGRESS  3.  Pt/family to verbalize understanding with hemi dressing/bathing techniques and A/E to increase independence with ADLS Baseline:  Goal status: IN PROGRESS  4.  Pt to verbalize understanding with memory compensation strategies  Baseline:  Goal status: INITIAL   LONG TERM  GOALS: Target date: 09/08/24  Independent with updated HEP  Baseline:  Goal status: INITIAL  2.  Pt to consistently perform dressing and bathing with no more than min assist Baseline:  Goal status: INITIAL  3.  Pt to use LUE as stabilizer at least 25% of the time for ADLS/bilateral tasks Baseline:  Goal status: INITIAL  4.  Pt to cut food with A/E (rocker knife)  Baseline:  Goal status: INITIAL  5.  Pt to id 3 pain management strategies for Lt hand I'ly Baseline:  Goal status: INITIAL   ASSESSMENT:  CLINICAL IMPRESSION: Patient is a 68 y.o. male who was seen today for occupational therapy treatment for CVA w/ Lt hemiparesis in April 2025. Pt/family with greater understanding of hemi dressing techniques. Hx includes CAD s/p CABG x3 2015, ischemic cardiomyopathy s/p ICD placement, HTN, HLD, HIV, GERD, CVA in 2008 without residual deficits. Patient currently presents below baseline level of functioning demonstrating functional deficits and impairments as noted below. Pt would benefit from skilled OT services in the outpatient setting to work on impairments as noted below to help pt return to PLOF as able.   SABRA   PERFORMANCE DEFICITS: in functional skills including ADLs, IADLs, coordination, edema, tone, ROM, strength, pain, Fine motor control, Gross  motor control, mobility, body mechanics, decreased knowledge of precautions, decreased knowledge of use of DME, and UE functional use, cognitive skills including memory.   IMPAIRMENTS: are limiting patient from ADLs, IADLs, rest and sleep, leisure, and social participation.   CO-MORBIDITIES: may have co-morbidities  that affects occupational performance. Patient will benefit from skilled OT to address above impairments and improve overall function.  MODIFICATION OR ASSISTANCE TO COMPLETE EVALUATION: No modification of tasks or assist necessary to complete an evaluation.  OT OCCUPATIONAL PROFILE AND HISTORY: Detailed assessment: Review of records and additional review of physical, cognitive, psychosocial history related to current functional performance.  CLINICAL DECISION MAKING: Moderate - several treatment options, min-mod task modification necessary  REHAB POTENTIAL: Good  EVALUATION COMPLEXITY: Moderate    PLAN:  OT FREQUENCY: 2x/week  OT DURATION: 8 weeks  PLANNED INTERVENTIONS: 97535 self care/ADL training, 02889 therapeutic exercise, 97530 therapeutic activity, 97112 neuromuscular re-education, 97140 manual therapy, 97113 aquatic therapy, 97018 paraffin, 02960 fluidotherapy, 97010 moist heat, 97010 cryotherapy, 97760 Orthotic Initial, 97763 Orthotic/Prosthetic subsequent, passive range of motion, functional mobility training, visual/perceptual remediation/compensation, energy conservation, patient/family education, and DME and/or AE instructions  RECOMMENDED OTHER SERVICES: none at this time  CONSULTED AND AGREED WITH PLAN OF CARE: Patient and family member/caregiver  PLAN FOR NEXT SESSION: Practice use of rocker knife, memory compensation strategies  *NO estim d/t defibrillator   Burnard JINNY Roads, OT 07/18/2024, 10:26 AM

## 2024-07-24 ENCOUNTER — Encounter: Payer: Self-pay | Admitting: Occupational Therapy

## 2024-07-24 ENCOUNTER — Other Ambulatory Visit (HOSPITAL_COMMUNITY): Payer: Self-pay

## 2024-07-24 ENCOUNTER — Ambulatory Visit: Admitting: Occupational Therapy

## 2024-07-24 ENCOUNTER — Ambulatory Visit: Admitting: Physical Therapy

## 2024-07-24 ENCOUNTER — Ambulatory Visit

## 2024-07-24 VITALS — BP 108/67 | HR 53

## 2024-07-24 DIAGNOSIS — R2681 Unsteadiness on feet: Secondary | ICD-10-CM

## 2024-07-24 DIAGNOSIS — R2689 Other abnormalities of gait and mobility: Secondary | ICD-10-CM

## 2024-07-24 DIAGNOSIS — I69398 Other sequelae of cerebral infarction: Secondary | ICD-10-CM

## 2024-07-24 DIAGNOSIS — R293 Abnormal posture: Secondary | ICD-10-CM

## 2024-07-24 DIAGNOSIS — I69354 Hemiplegia and hemiparesis following cerebral infarction affecting left non-dominant side: Secondary | ICD-10-CM | POA: Diagnosis not present

## 2024-07-24 DIAGNOSIS — M6281 Muscle weakness (generalized): Secondary | ICD-10-CM

## 2024-07-24 DIAGNOSIS — R262 Difficulty in walking, not elsewhere classified: Secondary | ICD-10-CM

## 2024-07-24 DIAGNOSIS — R471 Dysarthria and anarthria: Secondary | ICD-10-CM

## 2024-07-24 DIAGNOSIS — R1310 Dysphagia, unspecified: Secondary | ICD-10-CM

## 2024-07-24 NOTE — Therapy (Signed)
 OUTPATIENT SPEECH LANGUAGE PATHOLOGY TREATMENT   Patient Name: Richard [name] MRN: [id number] DOB:[birth year], 68 y.o., male Today's Date: 07/24/2024  PCP: [name], MD REFERRING PROVIDER: [name], MD  END OF SESSION:  End of Session - 07/24/24 1145     Visit Number 3    Number of Visits 25    Date for Recertification  10/03/24    SLP Start Time 1105    SLP Stop Time  1146    SLP Time Calculation (min) 41 min    Activity Tolerance Patient tolerated treatment well            Past Medical History:  Diagnosis Date   Coronary artery disease    a. cath 04/02/2014 3v dx 80-90% in D1, 80-90% mid LAD, 95% apical LAD, 95% distal inferior LV branch of LCx, total occlusion of prox RCA  b). Cath 04/03/2014 DES to mid LAD, DES x3 to prox D1, DAPT indefinitely. LCx lesion untreated   GERD (gastroesophageal reflux disease)    HIV (human immunodeficiency virus infection) (HCC)    Hypertension    Ischemic cardiomyopathy 08/09/2013   EF 20-25 percent by echo in 04/2014, s/p [employer] [place] model [id number] serial number [id number]   Stroke [facility])    Past Surgical History:  Procedure Laterality Date   CARDIAC CATHETERIZATION  04/02/2014   DR BURNARD   ESOPHAGOGASTRODUODENOSCOPY (EGD) WITH PROPOFOL  N/A 09/08/2018   Procedure: ESOPHAGOGASTRODUODENOSCOPY (EGD) WITH PROPOFOL ;  Surgeon: Rollin Dover, MD;  Location: [facility] END[facility]COPY;  Service: Endoscopy;  Laterality: N/A;   IMPLANTABLE CARDIOVERTER DEFIBRILLATOR IMPLANT N/A 09/10/2014   Procedure: IMPLANTABLE CARDIOVERTER DEFIBRILLATOR IMPLANT;  Surgeon: Jerel Balding, MD;  [facility] model [id number] serial number [id number]   IR CT HEAD LTD  12/01/2023   IR PERCUTANEOUS ART THROMBECTOMY/INFUSION INTRACRANIAL INC DIAG ANGIO  12/01/2023   IR US  GUIDE VASC ACCESS RIGHT  12/01/2023   LEFT AND RIGHT HEART CATHETERIZATION WITH CORONARY ANGIOGRAM N/A 04/02/2014   Procedure: LEFT AND RIGHT HEART CATHETERIZATION WITH CORONARY  ANGIOGRAM;  Surgeon: Debby DELENA Burnard, MD;  Location: [facility] CATH LAB;  Service: Cardiovascular;  Laterality: N/A;   MEDIAL COLLATERAL LIGAMENT REPAIR, KNEE Right 08/25/2015   Procedure: RIGHT THUMB RADICAL COLLATERAL LIGAMENT REPAIR;  Surgeon: Donnice Robinsons, MD;  Location: [facility] OR;  Service: Orthopedics;  Laterality: Right;   PERCUTANEOUS CORONARY STENT INTERVENTION (PCI-S) N/A 04/03/2014   Procedure: PERCUTANEOUS CORONARY STENT INTERVENTION (PCI-S);  Surgeon: Peter M Jordan, MD;  Location: [facility] CATH LAB;  Service: Cardiovascular;  Laterality: N/A;   RADIOLOGY WITH ANESTHESIA N/A 12/01/2023   Procedure: RADIOLOGY WITH ANESTHESIA;  Surgeon: Radiologist, Medication, MD;  Location: [facility] OR;  Service: Radiology;  Laterality: N/A;  BETHANY BRADFORD DILATION N/A 09/08/2018   Procedure: SAVORY DILATION;  Surgeon: Rollin Dover, MD;  Location: [facility] ENDOSCOPY;  Service: Endoscopy;  Laterality: N/A;   Patient Active Problem List   Diagnosis Date Noted   ICD (implantable cardioverter-defibrillator) discharge 03/23/2024   Hyperlipidemia 01/02/2024   CKD stage 3a, GFR 45-59 ml/min (HCC) 01/02/2024   Depression with anxiety 12/27/2023   Chronic systolic congestive heart failure (HCC) 12/23/2023   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   Acute ischemic right MCA stroke (HCC) 12/01/2023   Schizophrenia, unspecified (HCC) 03/07/2023   Recurrent major depression 03/07/2023   Pseudophakia of left eye 03/07/2023   Acid reflux 03/07/2023   Dyspepsia 03/07/2023   Anxiety state 03/07/2023   Cortical age-related cataract of right eye 03/07/2023   Psychotic disorder (  HCC) 03/07/2023   CAD (coronary artery disease) 03/07/2023   Abdominal mass 07/12/2021   Peripheral vascular disease 02/04/2021   Overweight 01/21/2020   Dysphagia 12/04/2018   Hiatal hernia 04/19/2018   Gastroesophageal reflux disease 04/03/2018   Ischemic congestive cardiomyopathy (HCC) 10/17/2017   Generalized anxiety disorder 10/17/2017    Prediabetes 10/16/2017   Acquired thrombocytopenia 10/16/2017   Chronic kidney disease, stage 3 (HCC) 10/16/2017   Primary erectile dysfunction 10/13/2017   Chronic combined systolic and diastolic heart failure (HCC) 12/30/2015   Traumatic rupture of collateral ligament of other finger at metacarpophalangeal and interphalangeal joint, subsequent encounter 10/30/2015   Traumatic rupture of collateral ligament of other finger at metacarpophalangeal and interphalangeal joint, initial encounter 08/12/2015   VT (ventricular tachycardia) (HCC) 12/31/2014   ICD (implantable cardioverter-defibrillator) in place 09/10/2014   At risk for sudden cardiac death 09-03-14   Systolic heart failure (HCC) 08/09/2014   Coronary artery disease involving native coronary artery of native heart without angina pectoris 04/04/2014   Unstable angina (HCC) 04/03/2014   Cardiomyopathy, ischemic 03/22/2014   History of CVA (cerebrovascular accident) 02/20/2014   Human immunodeficiency virus infection (HCC) 02/20/2014   Essential hypertension 02/20/2014   Hyperlipidemia LDL goal <70 02/20/2014   Depression 02/20/2014   Cerebral infarction (HCC) 08/09/2006    ONSET DATE: 11/2023  REFERRING DIAG: I63.9 (ICD-10-CM) - Cerebral infarction, unspecified  THERAPY DIAG:  Dysphagia, unspecified type  Dysarthria and anarthria  Rationale for Evaluation and Treatment: Rehabilitation  SUBJECTIVE:   SUBJECTIVE STATEMENT: It irritates something here in my throat, and I start coughing. Pt accompanied by: self and significant other  PERTINENT HISTORY: Pt is a 68 y.o. male who has a recent hx of CVA back in April, 2025. Pt noted residual deficits include dysarthria, cognitive-communication deficits and dysphagia. Pt's wife was present and provided additional hx. Pt's wife notices that pt's speech is low in volume during conversations. Pt recently received HH ST, primarily targeting dysarthria at sentence and  conversation levels. Pt also received ST in CIR 11/2023-12/2023, targeting swallowing, dysarthria, and cognition. Pt currently consumes regular/thin-liquid diet. Pt endorses coughing and choking on secretions on occasion. Pt's wife handles household responsibilities (finances, medication management, etc.). Pt's wife states that pt tends to lose focus during longer tasks. Pt enjoys watching sports at home. PMHx: CAD, GERD, HIV, HTN, ischemic cardiomyopathy, CVA.  PAIN:  Are you having pain? No  FALLS: Has patient fallen in last 6 months?  See PT evaluation for details  LIVING ENVIRONMENT: Lives with: lives with their family and lives with their spouse Lives in: House/apartment  PLOF:  Level of assistance: Comment: Independent with driving and medication management; wife still did finances  Employment: On disability  PATIENT GOALS: To improve thinking skills, swallowing, and speech.  OBJECTIVE:  Note: Objective measures were completed at Evaluation unless otherwise noted.  DIAGNOSTIC FINDINGS: MBSS from 12/08/23 Patient presents with mild oropharyngeal dysphagia. Oral phase is characterized by reduced lingual movement and labial seal resulting in anterior and posterior spillage. Patient independently utilized multiple swallows and digital sweep when necessary to clear oral residuals. Pharyngeal phase is remarkable for reduced epiglottic inversion and pharyngeal stripping wave resulting in mild pharyngeal residuals, mainly present in the vallecula and pyriform sinuses. Penetration present x2 with thin liquids (x1 via straw, x1 during liquid wash), though likely due to premature spillage and mistiming of epiglottic inversion and laryngeal vestibular closure. Pill consumed in puree, requiring additional bites of PO due to suspected esophageal retention. Recommend continuation of D2/thin  liquid diet with small, single sips of liquids. Patient should continue to utilize digital sweep to clear buccal  pocketing of solids. Recommend medications administered whole in puree with additional bites to promote esophogeal clearance.     COGNITION: Overall cognitive status: Impaired Areas of impairment:  Attention: Impaired: Selective Memory: Impaired: Immediate Working Short term Psychologist, Educational function: Impaired: Impulse control, Organization, Planning, and Error awareness Behavior: Impulsive Functional deficits: Medication management, time management.     MOTOR SPEECH: Overall motor speech: impaired Level of impairment: Conversation Respiration: thoracic breathing and diaphragmatic/abdominal breathing Phonation: normal and low vocal intensity Resonance: WFL Articulation: Impaired: conversation Intelligibility: Intelligibility reduced Motor planning: Appears intact Motor speech errors: aware and consistent Interfering components: N/A Effective technique: increased vocal intensity and pacing  ORAL MOTOR EXAMINATION: Overall status: Impaired:   Lingual: Left (Symmetry and Strength) Comments: N/A   STANDARDIZED ASSESSMENTS: SLUMS: 20/30  PATIENT REPORTED OUTCOME MEASURES (PROM): EAT-10: 20/36 and Communication Effectiveness Survey: 11/24                                                                                                                            TREATMENT DATE:   07/24/24: Dysphagia: Pt has been practicing HEP regularly at home. SLP reviewed lingual resistance exercises and pharyngeal strengthening exercises for optimizing pt's swallow safety and efficiency. Lingual medialization with resistance completed 10/10x with occasional min modeling and verbal cues. Lingual elevation and lateralization with resistance were each completed 5/5x with occasional min A for modeling. Effortful swallows and Masako each completed 10/10x with occasional min A. Pt notes that he continues to experiencing increased coughing with saliva at night-time. SLP educated pt about practicing swallow  HEP at night before bed to improve secretion management for saliva and reduce coughing. Pt is agreeable andwill try practicing HEP at night. Plan is to continue lingual and pharyngeal strengthening exercises for optimizing swallow safety and efficiency for QOL. Dysarthria: Targeted compensations for dysarthria (over-articulation, increased loudness) in sentence readings. SLP encouraged pt to utilize swallows before speaking to reduce choking on saliva during speech. Pt utilize a swallow prior to speaking in 90% of opportunities given occasional min verbal A. During 5-7 word sentence readings, pt utilized dysarthria compensations in 90% of opportunities given occasional min verbal A. SLP challenged pt to utilize dysarthria compensations during relevant phrases (personal information). Pt continued to utilize compensations with 90% consistency given occasional min A. Pt is making progress with speech and swallow goals. SLP provided pt homework focused on /s/ sound production in multi-syllabic words and functional phrases; SLP encouraged pt to continue utilizing over-articulation during HEP for maximizing speech intelligibility. Plan is to continue sentence readings and functional phrase readings for improving pt use of dysarthria compensations.   07/16/24: Dysphagia: Initiated HEP for dysphagia and lingual weakness. See Patient Instructions. Lingual elevation and lateralization with resistance exercises completed 15/15x with occasional min modeling and verbal cues. Effortful swallows and Masako completed 10/10x with occasional min A. Targeted more frequent swallowing and  swallowing prior to speaking to reduce choking on saliva. With usual verbal cues, Willman demonstrated swallow prior to speaking on structured speech task. He required frequent cues to swallow prior to speaking in conversation. Introduced concept of swallowing with intent, paying attention when eating and drinking  Dysarthria: Targeted compensations  for dysarthria in structured speech task generating 3 sentence descriptions - Volume remained WNL with rare min A. Slow rate, over articulation and separating syllables required occasional min modeling and verbal cues. Joakim required cueing to ID and correct slurred/unintelligible utterances 3/3x initially. Targeted 3 syllable words - using separate syllables at slow rate to compensate for dysarthria generating sentences with these words -After initial modeling and instruction, Abdinasir was intelligible in sentences with 3 syllable words 12/12x. Generated list of personally relevant phrases to practice including biographical information he uses with doctors' offices, and most frequent meal orders. At the end of session, Primo Id'd and self correct imprecise speech in conversation with spouse with mod I.  07/11/24: Evaluation completed. No tx was conducted on this date.    PATIENT EDUCATION: Education details: Evaluation results/goal setting. Person educated: Patient and Spouse Education method: Explanation Education comprehension: verbalized understanding   GOALS: Goals reviewed with patient? Yes  SHORT TERM GOALS: Target date: 08/15/2024  Pt will achieve average >70 dB during 8 minute conversation given occasional min A. Baseline: Goal status: ONGOING  2.  Pt will perform pharyngeal and lingual strengthening exercises with 90% consistency when given rare min A. Baseline:  Goal status: ONGOING  3.  Pt and pt's family will utilize at least 1 external memory strategy for maximizing attention during medication management given occasional min A.  Baseline:  Goal status: ONGOING  4.  Pt will display use of aspiration precautions during PO trials of regular/thin-liquids in 90% of opportunities given rare min A Baseline:  Goal status: ONGOING  5.  Pt will achieve 95-97% speech intelligibility during 8 minute conversation with familiar listeners given occasional min A.  Baseline:  Goal  status: ONGOING   LONG TERM GOALS: Target date: 10/03/2024  Pt will achieve average >70 dB during a 12 minute conversation given rare min A.  Baseline:  Goal status: ONGOING  2.  Pt will reduce score on EAT-10 PROM, indicating improved self-perception of swallowing function.  Baseline: 20/36 Goal status: ONGOING  3.  Pt and pt's family will describe and demonstrate at least 2 memory strategies for optimizing pt independence with certain ADL tasks (medication management) given rare min A. Baseline:  Goal status: ONGOING  4.  Pt will improve score on Communicative Effectiveness Survey, indicating speech is optimal for successful communication in different contexts.  Baseline: 11/24 Goal status: ONGOING   ASSESSMENT:  CLINICAL IMPRESSION: Patient is a 68 y.o. male who was seen today for evaluation of motor speech, dysphagia, and cognition. Pt presents with mild-moderate dysarthria characterized by consistent, imprecise articulation and reduced vocal loudness at the conversational level. Pt also presents with mild-moderate cognitive communication deficits primarily characterized by short-term memory impairment, executive function deficits, and impulsive/inattentive behavior. Pt has hx of oropharyngeal dysphagia and currently still experience difficulty with swallowing. According to EAT-10, pt notes that he experiences difficulty with both solids and liquids during swallowing. Recommend ST targeting conversation-level dysarthria tx and cognitive-communication tx focused on establishing external memory aids for optimizing pt communication effectiveness, safety, and independence. In addition, recommend swallow tx focused on pharyngeal strengthening exercises and aspiration precaution strategies to improve pt perception of swallowing difficulties.   OBJECTIVE IMPAIRMENTS: include  attention, memory, executive functioning, dysarthria, and dysphagia. These impairments are limiting patient from  managing medications, ADLs/IADLs, effectively communicating at home and in community, and safety when swallowing. Factors affecting potential to achieve goals and functional outcome are N/A. Patient will benefit from skilled SLP services to address above impairments and improve overall function.  REHAB POTENTIAL: Good  PLAN:  SLP FREQUENCY: 2x/week  SLP DURATION: 12 weeks  PLANNED INTERVENTIONS: Aspiration precaution training, Pharyngeal strengthening exercises, Internal/external aids, Functional tasks, SLP instruction and feedback, Patient/family education, [phone] Treatment of speech (30 or 45 min) , and [id number] Treatment of swallowing function    W[employer]ell [employer], CF-SLP 07/24/2024, 11:54 AM

## 2024-07-24 NOTE — Patient Instructions (Signed)

## 2024-07-24 NOTE — Therapy (Signed)
 OUTPATIENT OCCUPATIONAL THERAPY NEURO TREATMENT  Patient Name: Richard [name] MRN: [id number] DOB:08/22/1955, 68 y.o., male Today's Date: 07/24/2024  PCP: [name], MD REFERRING PROVIDER: [name], MD  END OF SESSION:  OT End of Session - 07/24/24 1229     Visit Number 4    Number of Visits 15    Date for Recertification  09/08/24    Authorization Type VA approved 15 visits from 06/07/24 - 10/05/24    OT Start Time 1230    OT Stop Time 1315    OT Time Calculation (min) 45 min    Activity Tolerance Patient tolerated treatment well    Behavior During Therapy [facility] for tasks assessed/performed          Past Medical History:  Diagnosis Date   Coronary artery disease    a. cath 04/02/2014 3v dx 80-90% in D1, 80-90% mid LAD, 95% apical LAD, 95% distal inferior LV branch of LCx, total occlusion of prox RCA  b). Cath 04/03/2014 DES to mid LAD, DES x3 to prox D1, DAPT indefinitely. LCx lesion untreated   GERD (gastroesophageal reflux disease)    HIV (human immunodeficiency virus infection) (HCC)    Hypertension    Ischemic cardiomyopathy 08/09/2013   EF 20-25 percent by echo in 04/2014, s/p [employer] model [id number] serial number [id number]   Stroke [facility])    Past Surgical History:  Procedure Laterality Date   CARDIAC CATHETERIZATION  04/02/2014   DR BURNARD   ESOPHAGOGASTRODUODENOSCOPY (EGD) WITH PROPOFOL  N/A 09/08/2018   Procedure: ESOPHAGOGASTRODUODENOSCOPY (EGD) WITH PROPOFOL ;  Surgeon: Rollin Dover, MD;  Location: [facility] ENDOSCOPY;  Service: Endoscopy;  Laterality: N/A;   IMPLANTABLE CARDIOVERTER DEFIBRILLATOR IMPLANT N/A 09/10/2014   Procedure: IMPLANTABLE CARDIOVERTER DEFIBRILLATOR IMPLANT;  Surgeon: Jerel Balding, MD;  [employer] model [phone] serial number [id number]   IR CT HEAD LTD  12/01/2023   IR PERCUTANEOUS ART THROMBECTOMY/INFUSION INTRACRANIAL INC DIAG ANGIO  12/01/2023   IR US  GUIDE VASC ACCESS RIGHT  12/01/2023   LEFT AND RIGHT  HEART CATHETERIZATION WITH CORONARY ANGIOGRAM N/A 04/02/2014   Procedure: LEFT AND RIGHT HEART CATHETERIZATION WITH CORONARY ANGIOGRAM;  Surgeon: Debby DELENA Burnard, MD;  Location: [facility] CATH LAB;  Service: Cardiovascular;  Laterality: N/A;   MEDIAL COLLATERAL LIGAMENT REPAIR, KNEE Right 08/25/2015   Procedure: RIGHT THUMB RADICAL COLLATERAL LIGAMENT REPAIR;  Surgeon: Donnice Robinsons, MD;  Location: [facility] OR;  Service: Orthopedics;  Laterality: Right;   PERCUTANEOUS CORONARY STENT INTERVENTION (PCI-S) N/A 04/03/2014   Procedure: PERCUTANEOUS CORONARY STENT INTERVENTION (PCI-S);  Surgeon: Peter M Jordan, MD;  Location: [facility] CATH LAB;  Service: Cardiovascular;  Laterality: N/A;   RADIOLOGY WITH ANESTHESIA N/A 12/01/2023   Procedure: RADIOLOGY WITH ANESTHESIA;  Surgeon: Radiologist, Medication, MD;  Location: [facility] OR;  Service: Radiology;  Laterality: N/A;  BETHANY BRADFORD DILATION N/A 09/08/2018   Procedure: SAVORY DILATION;  Surgeon: Rollin Dover, MD;  Location: [facility] ENDOS[facility];  Service: Endoscopy;  Laterality: N/A;   Patient Active Problem List   Diagnosis Date Noted   ICD (implantable cardioverter-defibrillator) discharge 03/23/2024   Hyperlipidemia 01/02/2024   CKD stage 3a, GFR 45-59 ml/min (HCC) 01/02/2024   Depression with anxiety 12/27/2023   Chronic systolic congestive heart failure (HCC) 12/23/2023   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   Acute ischemic right MCA stroke (HCC) 12/01/2023   Schizophrenia, unspecified (HCC) 03/07/2023   Recurrent major depression 03/07/2023   Pseudophakia of left eye 03/07/2023   Acid reflux 03/07/2023  Dyspepsia 03/07/2023   Anxiety state 03/07/2023   Cortical age-related cataract of right eye 03/07/2023   Psychotic disorder (HCC) 03/07/2023   CAD (coronary artery disease) 03/07/2023   Abdominal mass 07/12/2021   Peripheral vascular disease 02/04/2021   Overweight 01/21/2020   Dysphagia 12/04/2018   Hiatal hernia 04/19/2018   Gastroesophageal  reflux disease 04/03/2018   Ischemic congestive cardiomyopathy (HCC) 10/17/2017   Generalized anxiety disorder 10/17/2017   Prediabetes 10/16/2017   Acquired thrombocytopenia 10/16/2017   Chronic kidney disease, stage 3 (HCC) 10/16/2017   Primary erectile dysfunction 10/13/2017   Chronic combined systolic and diastolic heart failure (HCC) 12/30/2015   Traumatic rupture of collateral ligament of other finger at metacarpophalangeal and interphalangeal joint, subsequent encounter 10/30/2015   Traumatic rupture of collateral ligament of other finger at metacarpophalangeal and interphalangeal joint, initial encounter 08/12/2015   VT (ventricular tachycardia) (HCC) 12/31/2014   ICD (implantable cardioverter-defibrillator) in place 09/10/2014   At risk for sudden cardiac death September 09, 2014   Systolic heart failure (HCC) 08/09/2014   Coronary artery disease involving native coronary artery of native heart without angina pectoris 04/04/2014   Unstable angina (HCC) 04/03/2014   Cardiomyopathy, ischemic 03/22/2014   History of CVA (cerebrovascular accident) 02/20/2014   Human immunodeficiency virus infection (HCC) 02/20/2014   Essential hypertension 02/20/2014   Hyperlipidemia LDL goal <70 02/20/2014   Depression 02/20/2014   Cerebral infarction (HCC) 08/09/2006    ONSET DATE: 06/19/2024  REFERRING DIAG: I63.9 (ICD-10-CM) - Cerebral infarction, unspecified  Note:  [employer] Auth# [id number] 06/07/24 - 10/05/24 15 OT visits  THERAPY DIAG:  Hemiplegia 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 Splint going well Pt accompanied by: significant other (wife)   PERTINENT HISTORY: presented 12/01/23 with L-sided weakness and slurred speech. CTA  showed occlusion of the right M1 segment, proximal M2 branch of the right MCA with intraluminal thrombus. S/p mechanical thrombectomy 4/24. CT showed subtle hypoattenuation involving the right caudate and right lentiform nuclei which may reflect acute infarct. MRI pending. PMH: CAD s/p CABG x3 2015, ischemic cardiomyopathy s/p ICD placement, HTN, HLD, HIV, GERD, CVA in 2008 without residual deficits  PRECAUTIONS: Fall and ICD/Pacemaker, no driving  WEIGHT BEARING RESTRICTIONS: No  PAIN:  Are you having pain? Lt hand fluctuates 5/10  FALLS: Has patient fallen in last 6 months? Yes. Number of falls 4; slid off sofa, fell off bed, nearly fell off commode into tub    LIVING ENVIRONMENT: Lives with: lives with their spouse Lives in: one level home Stairs: Yes: External: 1 steps; on right going up Has following equipment at home: Hemi walker, Wheelchair (manual), Shower bench, bed side commode, and Grab bars   PLOF: Independent  PATIENT GOALS: get out of this wheelchair  OBJECTIVE:  Note: Objective measures were completed at Evaluation unless otherwise noted.  HAND DOMINANCE: Right  ADLs:  Eating: assist to cut food Grooming: set up for brushing gums and implants, wife brushes dentures and shaves him (wife always shaved pt even prior to CVA) UB Dressing: mod to max assist LB Dressing: max assist Toileting: mod I  Bathing: mod assist - mostly seated Tub Shower transfers: min assist for leg management on Lt  Equipment: Transfer tub bench, Grab bars, and hand held shower  IADLs: dependent for all IADLS Handwriting: No changes  MOBILITY STATUS: uses w/c mostly, uses hemi walker some at home   UPPER EXTREMITY ROM:  RUE AROM WNL's  LUE AROM dominated by synergy pattern (goes into some sh abduction, elbow flex, pronation but no isolated movement) No active movement in hand Pt can only tolerate approx 75% passive shoulder flexion   UPPER EXTREMITY MMT:   NT d/t spasticity   HAND  FUNCTION: No functional use LT hand  COORDINATION: No functional use Lt hand  SENSATION: WFL  EDEMA: mild Lt hand  MUSCLE TONE: LUE: Moderate, Hypertonic, and Modifed Ashworth Scale 3 = Considerable increase in muscle tone, passive movement difficult at elbow, 2/4 wrist and fingers  COGNITION: Overall cognitive status: Impaired, decreased memory Wife reports he sometimes doesn't turn the sink off after washing hands  VISION: Subjective report: about the same to me. Got new glasses Baseline vision: Wears glasses all the time but not wearing at evaluation Visual history: cataract sx OU  VISION ASSESSMENT: Not tested    PERCEPTION: Not tested  PRAXIS: Not tested  OBSERVATIONS: dysarthria of speech, reports getting choked especially on water and saliva                                                                                                                             TREATMENT DATE: 07/24/24  Pt shown rocker knife and demo how to use, then pt return demo with great success. Recommended dycem or shelf liner under plate to keep from sliding. Handouts on A/E reviewed including: rocker knife, shoe buttons or spyrolaces  Pt issued memory compensation strategies and reviewed with pt/wife how to implement for tasks including medication management. Recommended pill box for medications. Pt will still need assistance but cued to give questioning cues for reminders if needed  Wt bearing over BUE's on table while in standing with A/P wt shifts, side to side wt shifts, modified push ups, and then progressing to disengaging RUE to increase weight over Lt hemiparetic side w/ mod assist to support Lt hand and elbow.  Pt/wife shown how to perform at home with assistance - pt cannot do I'ly     PATIENT EDUCATION: Education details: see above Person educated: Patient and Spouse Education method: Explanation, Demonstration, and Verbal cues Education comprehension: verbalized  understanding, verbal cues required, and needs further education  HOME EXERCISE PROGRAM: 07/09/24: Initial LUE HEP    GOALS: Goals reviewed with patient? Yes  SHORT TERM GOALS: Target date: 08/09/24  Independent with HEP for LUE Baseline: Goal status: IN PROGRESS  2.  Independent with splint wear and care for Lt hand/wrist (resting hand splint) Baseline:  Goal status: IN PROGRESS  3.  Pt/family to verbalize understanding with hemi dressing/bathing techniques and A/E to increase independence with ADLS Baseline:  Goal status: IN PROGRESS  4.  Pt to verbalize understanding with memory compensation strategies  Baseline:  Goal status: IN PROGRESS   LONG TERM GOALS: Target date: 09/08/24  Independent with updated HEP  Baseline:  Goal status: INITIAL  2.  Pt to consistently perform dressing and bathing with no more than min assist Baseline:  Goal status: INITIAL  3.  Pt to use LUE as stabilizer at least 25% of the time for ADLS/bilateral tasks Baseline:  Goal status: INITIAL  4.  Pt to cut food with A/E (rocker knife)  Baseline:  Goal status: INITIAL  5.  Pt to id 3 pain management strategies for Lt hand I'ly Baseline:  Goal status: INITIAL   ASSESSMENT:  CLINICAL IMPRESSION: Patient is a 68 y.o. male who was seen today for occupational therapy treatment for CVA w/ Lt hemiparesis in April 2025. Pt/family with greater understanding of hemi dressing techniques AND progressing towards all STG's. Hx includes CAD s/p CABG x3 2015, ischemic cardiomyopathy s/p ICD placement, HTN, HLD, HIV, GERD, CVA in 2008 without residual deficits. Patient currently presents below baseline level of functioning demonstrating functional deficits and impairments as noted below. Pt would benefit from skilled OT services in the outpatient setting to work on impairments as noted below to help pt return to PLOF as able.   SABRA   PERFORMANCE DEFICITS: in functional skills including ADLs, IADLs,  coordination, edema, tone, ROM, strength, pain, Fine motor control, Gross motor control, mobility, body mechanics, decreased knowledge of precautions, decreased knowledge of use of DME, and UE functional use, cognitive skills including memory.   IMPAIRMENTS: are limiting patient from ADLs, IADLs, rest and sleep, leisure, and social participation.   CO-MORBIDITIES: may have co-morbidities  that affects occupational performance. Patient will benefit from skilled OT to address above impairments and improve overall function.  MODIFICATION OR ASSISTANCE TO COMPLETE EVALUATION: No modification of tasks or assist necessary to complete an evaluation.  OT OCCUPATIONAL PROFILE AND HISTORY: Detailed assessment: Review of records and additional review of physical, cognitive, psychosocial history related to current functional performance.  CLINICAL DECISION MAKING: Moderate - several treatment options, min-mod task modification necessary  REHAB POTENTIAL: Good  EVALUATION COMPLEXITY: Moderate    PLAN:  OT FREQUENCY: 2x/week  OT DURATION: 8 weeks  PLANNED INTERVENTIONS: 97535 self care/ADL training, 02889 therapeutic exercise, 97530 therapeutic activity, 97112 neuromuscular re-education, 97140 manual therapy, 97113 aquatic therapy, 97018 paraffin, 02960 fluidotherapy, 97010 moist heat, 97010 cryotherapy, 97760 Orthotic Initial, 97763 Orthotic/Prosthetic subsequent, passive range of motion, functional mobility training, visual/perceptual remediation/compensation, energy conservation, patient/family education, and DME and/or AE instructions  RECOMMENDED OTHER SERVICES: none at this time  CONSULTED AND AGREED WITH PLAN OF CARE: Patient and family member/caregiver  PLAN FOR NEXT SESSION: consider padding edging of resting hand splint distally at fingertips in case pt brings hand to face at night, wt bearing over LUE seated at mat, AA/ROM using barstool or UE Ranger  *NO estim d/t  defibrillator   Burnard JINNY Roads, OT 07/24/2024, 12:29 PM

## 2024-07-24 NOTE — Therapy (Signed)
 OUTPATIENT PHYSICAL THERAPY NEURO TREATMENT   Patient Name: Richard [name] MRN: [id number] DOB:Dec 17, 1955, 68 y.o., male Today's Date: 07/24/2024  PCP: Ezekiel Charleston, MD REFERRING PROVIDER: Wynne Flatten, MD  END OF SESSION:  PT End of Session - 07/24/24 1150     Visit Number 15    Number of Visits 27   re-cert   Date for Recertification  98/69/73   re-cert; due to potential delay in scheduling   Authorization Type [facility]    Authorization Time Period 05/13/24-09/10/24    Authorization - Number of Visits 15    Progress Note Due on Visit 10    PT Start Time 1150    PT Stop Time 1230    PT Time Calculation (min) 40 min    Equipment Utilized During Treatment Gait belt    Activity Tolerance Patient tolerated treatment well    Behavior During Therapy WFL for tasks assessed/performed           Past Medical History:  Diagnosis Date   Coronary artery disease    a. cath 04/02/2014 3v dx 80-90% in D1, 80-90% mid LAD, 95% apical LAD, 95% distal inferior LV branch of LCx, total occlusion of prox RCA  b). Cath 04/03/2014 DES to mid LAD, DES x3 to prox D1, DAPT indefinitely. LCx lesion untreated   GERD (gastroesophageal reflux disease)    HIV (human immunodeficiency virus infection) (HCC)    Hypertension    Ischemic cardiomyopathy 08/09/2013   EF 20-25 percent by echo in 04/2014, s/p [employer] [place] model [id number] serial number [id number]   Stroke [facility])    Past Surgical History:  Procedure Laterality Date   CARDIAC CATHETERIZATION  04/02/2014   DR BURNARD   ESOPHAGOGASTRODUODENOSCOPY (EGD) WITH PROPOFOL  N/A 09/08/2018   Procedure: ESOPHAGOGASTRODUODENOSCOPY (EGD) WITH PROPOFOL ;  Surgeon: Rollin Dover, MD;  Location: [facility];  Service: Endoscopy;  Laterality: N/A;   IMPLANTABLE CARDIOVERTER DEFIBRILLATOR IMPLANT N/A 09/10/2014   Procedure: IMPLANTABLE CARDIOVERTER DEFIBRILLATOR IMPLANT;  Surgeon: Jerel Balding, MD;  [facility] [place] model [phone] serial number [id number]    IR CT HEAD LTD  12/01/2023   IR PERCUTANEOUS ART THROMBECTOMY/INFUSION INTRACRANIAL INC DIAG ANGIO  12/01/2023   IR US  GUIDE VASC ACCESS RIGHT  12/01/2023   LEFT AND RIGHT HEART CATHETERIZATION WITH CORONARY ANGIOGRAM N/A 04/02/2014   Procedure: LEFT AND RIGHT HEART CATHETERIZATION WITH CORONARY ANGIOGRAM;  Surgeon: Debby DELENA Burnard, MD;  Location: [facility] CATH LAB;  Service: Cardiovascular;  Laterality: N/A;   MEDIAL COLLATERAL LIGAMENT REPAIR, KNEE Right 08/25/2015   Procedure: RIGHT THUMB RADICAL COLLATERAL LIGAMENT REPAIR;  Surgeon: Donnice Robinsons, MD;  Location: [facility] OR;  Service: Orthopedics;  Laterality: Right;   PERCUTANEOUS CORONARY STENT INTERVENTION (PCI-S) N/A 04/03/2014   Procedure: PERCUTANEOUS CORONARY STENT INTERVENTION (PCI-S);  Surgeon: Peter M Jordan, MD;  Location: [facility] CATH LAB;  Service: Cardiovascular;  Laterality: N/A;   RADIOLOGY WITH ANESTHESIA N/A 12/01/2023   Procedure: RADIOLOGY WITH ANESTHESIA;  Surgeon: Radiologist, Medication, MD;  Location: [facility] OR;  Service: Radiology;  Laterality: N/A;  BETHANY BRADFORD DILATION N/A 09/08/2018   Procedure: SAVORY DILATION;  Surgeon: Rollin Dover, MD;  Location: [facility];  Service: Endoscopy;  Laterality: N/A;   Patient Active Problem List   Diagnosis Date Noted   ICD (implantable cardioverter-defibrillator) discharge 03/23/2024   Hyperlipidemia 01/02/2024   CKD stage 3a, GFR 45-59 ml/min (HCC) 01/02/2024   Depression with anxiety 12/27/2023   Chronic systolic congestive heart failure (HCC) 12/23/2023   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   Acute ischemic right MCA stroke (HCC) 12/01/2023   Schizophrenia, unspecified (HCC) 03/07/2023   Recurrent major depression 03/07/2023   Pseudophakia of left eye 03/07/2023   Acid reflux 03/07/2023   Dyspepsia 03/07/2023   Anxiety state 03/07/2023   Cortical age-related cataract of right eye 03/07/2023   Psychotic disorder (HCC) 03/07/2023   CAD (coronary artery disease)  03/07/2023   Abdominal mass 07/12/2021   Peripheral vascular disease 02/04/2021   Overweight 01/21/2020   Dysphagia 12/04/2018   Hiatal hernia 04/19/2018   Gastroesophageal reflux disease 04/03/2018   Ischemic congestive cardiomyopathy (HCC) 10/17/2017   Generalized anxiety disorder 10/17/2017   Prediabetes 10/16/2017   Acquired thrombocytopenia 10/16/2017   Chronic kidney disease, stage 3 (HCC) 10/16/2017   Primary erectile dysfunction 10/13/2017   Chronic combined systolic and diastolic heart failure (HCC) 12/30/2015   Traumatic rupture of collateral ligament of other finger at metacarpophalangeal and interphalangeal joint, subsequent encounter 10/30/2015   Traumatic rupture of collateral ligament of other finger at metacarpophalangeal and interphalangeal joint, initial encounter 08/12/2015   VT (ventricular tachycardia) (HCC) 12/31/2014   ICD (implantable cardioverter-defibrillator) in place 09/10/2014   At risk for sudden cardiac death 2014/09/05   Systolic heart failure (HCC) 08/09/2014   Coronary artery disease involving native coronary artery of native heart without angina pectoris 04/04/2014   Unstable angina (HCC) 04/03/2014   Cardiomyopathy, ischemic 03/22/2014   History of CVA (cerebrovascular accident) 02/20/2014   Human immunodeficiency virus infection (HCC) 02/20/2014   Essential hypertension 02/20/2014   Hyperlipidemia LDL goal <70 02/20/2014   Depression 02/20/2014   Cerebral infarction (HCC) 08/09/2006    ONSET DATE: 05/15/24 referral    REFERRING DIAG: I63.9 (ICD-10-CM) - Cerebral infarction, unspecified   THERAPY DIAG:  Hemiplegia and hemiparesis following cerebral infarction affecting left non-dominant side (HCC)  Unsteadiness on feet  Abnormal posture  Difficulty in walking, not elsewhere classified  Muscle weakness (generalized)  Other abnormalities of gait and mobility  Rationale for Evaluation and Treatment: Rehabilitation  SUBJECTIVE:                                                                                                                                                                                              SUBJECTIVE STATEMENT:  Pt denies any acute changes since last visit, no falls, no pain today.  Pt accompanied by: significant other, wife Veronica  PERTINENT HISTORY: CAD, GERD, HIV, HTN, ischemic cardiomyopathy, CVA  PAIN:  Are you having pain? No  PRECAUTIONS: Fall and ICD/Pacemaker; L hemi   PATIENT GOALS: to walk and use my hand   OBJECTIVE:  Note: Objective  measures were completed at Evaluation unless otherwise noted.  DIAGNOSTIC FINDINGS: 12/01/23 head CT IMPRESSION: 1. Subtle hypoattenuation involving the right caudate and right lentiform nuclei which may reflect acute infarct. 2. No acute intracranial hemorrhage. 3. Remote infarct involving the right frontal operculum, insula, and superior right temporal lobe. 4. Asymmetric density of the right M1 segment. Recommend correlation with CTA. 5. ASPECTS is 8   GAIT: Findings: Gait Characteristics: step to pattern, decreased arm swing- Left, decreased step length- Right, decreased stance time- Left, decreased stride length, decreased hip/knee flexion- Left, decreased ankle dorsiflexion- Left, circumduction- Left, Left hip hike, trunk rotated posterior- Left, trunk flexed, narrow BOS, and poor foot clearance- Left, Assistive device utilized:Hemi walker, and Level of assistance: CGA and Min A                                                                                                             TREATMENT   Self-Care Vitals:   07/24/24 1152  BP: 108/67  Pulse: (!) 53   BP assessed in RUE in sitting at rest, per wife pt's BP tends to run low. No complaints of dizziness or lightheadedness during session.   NMR:  Gait pattern: decreased hip/knee flexion- Left, decreased ankle dorsiflexion- Left, circumduction- Left, scissoring, and lateral  lean- Right Distance walked: 115 ft, 58 ft x 2 Assistive device utilized: Quad cane large base Level of assistance: Min A Comments: with L AFO  Discussed not purchasing a LBQC yet as he may progress to LRAD (currently has HW at home).  In // bars with RUE support: RLE 4 foam beam step overs forwards/back with cues for L quad activation and weight shift to the L (use of mirror) 3 x 10 reps LLE pole step overs with visual target on floor to prevent scissoring 3 x 10 reps Static stance with RLE on green dynadisc with tactile cues for LLE activation 3 x 30 sec   PATIENT EDUCATION: Education details: continue HEP, hold off on getting a LBQC yet Person educated: Patient and Spouse Education method: Explanation, Demonstration, Tactile cues, and Verbal cues Education comprehension: verbalized understanding, returned demonstration, verbal cues required, tactile cues required, and needs further education  HOME EXERCISE PROGRAM: Access Code: [email or web address] URL: [email or web address] Date: 06/06/2024 Prepared by: Delon Pop  Exercises - Supine Bridge  - 1 x daily - 7 x weekly - 3 sets - 10 reps - Supine March with Posterior Pelvic Tilt  - 1 x daily - 7 x weekly - 3 sets - 10 reps - Mini Squat with Counter Support  - 1 x daily - 7 x weekly - 3 sets - 8-10 reps - Standing Knee Flexion with Counter Support  - 1 x daily - 7 x weekly - 3 sets - 6-8 reps - Standing March with Counter Support  - 1 x daily - 7 x weekly - 3 sets - 8-10 reps  GOALS: Goals reviewed with patient? Yes  SHORT TERM GOALS: Target date: 06/22/24  Pt will be independent with initial HEP for improved functional strength  and balance  Baseline: to be provided  Goal status: MET  2.  Pt will improve gait speed to >/= .59m/s to demonstrate improved community ambulation  Baseline: .56m/s with HW + CGA; .34m/s with LBQC + CGA Goal status: MET  3.  Pt will improve TUG to </= 70 secs to demonstrated reduced  fall risk  Baseline: 98s with HW + MinA; 79s LBQC + CGA Goal status: IN PROGRESS  4.  Pt will improve 5x STS to </= 22 sec to demo improved functional LE strength and balance   Baseline: 30.81 with CGA + R UE; 24/5s no UE + CGA Goal status: IN PRPOGRESS   LONG TERM GOALS: Target date: 07/13/24 (to match cert date)   Pt will be independent with final HEP for improved functional strength and balance  Baseline: to be provided; provided Goal status: MET  Pt will improve gait speed to >/= .57m/s to demonstrate improved community ambulation  Baseline: .79m/s with HW + CGA; .19m/s LBQC + CGA Goal status: IN PROGRESS  Pt will improve TUG to </= 50 secs to demonstrated reduced fall risk  Baseline: 98s with HW + MinA; 66s LBQC + CGA Goal status: IN PROGRESS  Pt will improve 5x STS to </= 15 sec to demo improved functional LE strength and balance   Baseline: 30.81 with CGA + R UE; 24s R UE + CGA Goal status: IN PROGRESS  NEW SHORT TERM GOALS: 08/10/24 Pt will be compliant with updated HEP for improved functional strength and balance   Baseline: to be updated  Goal status: NEW   2. Pt will improve gait speed to >/= .22m/s to demonstrate improved community ambulation  Baseline: .33m/s LBQC + CGA Goal status: NEW  3. Pt will improve TUG to </= 50 secs to demonstrated reduced fall risk  Baseline: 66s LBQC + CGA Goal status: NEW  4. Pt will improve 5x STS to </= 20 sec to demo improved functional LE strength and balance   Baseline:  24s R UE + CGA Goal status: NEW   NEW LONG TERM GOALS: 09/07/24  Pt will be compliant with updated final HEP for improved functional strength and balance   Baseline: to be updated  Goal status: NEW  2. Pt will improve gait speed to >/= .56m/s to demonstrate improved community ambulation  Baseline: .28m/s with HW + CGA Goal status: NEW  3. Pt will improve TUG to </= 40 secs to demonstrated reduced fall risk  Baseline: 66s LBQC + CGA Goal status:  NEW  4. Pt will improve 5x STS to </= 15 sec to demo improved functional LE strength and balance   Baseline: 24s R UE + CGA Goal status: NEW   ASSESSMENT:  CLINICAL IMPRESSION: Emphasis of skilled PT session on continuing to assess vitals, working on gait training with [facility], and working on LLE NMR and increased WB on limb. Pt with ongoing gait deviations as noted above. He initially has increased difficulty with SLS on his LLE but with increased practice he becomes more comfortable with it and with lateral weight shift. He also initially has increased scissoring of his LLE when performing pole-stepovers, but with visual target on the ground he exhibits improved accuracy of aim with his LE and improved placement when taking a step, decreased scissoring noted. He continues to benefit from skilled PT services to work towards increased safety and independence with his functional mobility. Continue POC.    OBJECTIVE IMPAIRMENTS: Abnormal gait, decreased balance, decreased coordination, decreased endurance,  decreased knowledge of condition, decreased knowledge of use of DME, decreased ROM, decreased strength, impaired tone, impaired UE functional use, and postural dysfunction.   ACTIVITY LIMITATIONS: carrying, lifting, bending, standing, squatting, stairs, transfers, bed mobility, locomotion level, and caring for others  PARTICIPATION LIMITATIONS: meal prep, cleaning, interpersonal relationship, driving, shopping, community activity, occupation, and yard work  PERSONAL FACTORS: Age, Fitness, Past/current experiences, Time since onset of injury/illness/exacerbation, Transportation, and 3+ comorbidities: see above are also affecting patient's functional outcome.   REHAB POTENTIAL: Fair time since onset  CLINICAL DECISION MAKING: Stable/uncomplicated  EVALUATION COMPLEXITY: Low  PLAN:  PT FREQUENCY: 2x/week  PT DURATION: other: 7 weeks  PLANNED INTERVENTIONS: [id number]- PT Re-evaluation, 97750-  Physical Performance Testing, 97110-Therapeutic exercises, 97530- Therapeutic activity, [id number]- Neuromuscular re-education, 97535- Self Care, [id number]- Manual therapy, [id number]- Gait training, [phone]- Orthotic Initial, [phone]- Orthotic/Prosthetic subsequent, [phone]- Aquatic Therapy, [phone] (1-2 muscles), [id number] (3+ muscles)- Dry Needling, Patient/Family education, Balance training, Stair training, Taping, Vestibular training, Visual/preceptual remediation/compensation, DME instructions, and Wheelchair mobility training  PLAN FOR NEXT SESSION: HIGT! Continue working with [facility] Continue use of L LE, weight shifting in bars with toe taps, improve L hip flexion strength. Facilitate left weight shifting during ambulation/ literally anything to get more weight on the left leg (modified SLS, R LE step ups so he has to spend more time on L, etc); staggered sit to stands, L hip abd strengthening  Update patient and his wife when he should get a LBQC or LRAD   Waddell Southgate, PT Waddell Southgate, PT, DPT, CSRS   07/24/2024, 2:37 PM

## 2024-07-26 ENCOUNTER — Ambulatory Visit: Admitting: Physical Therapy

## 2024-07-26 ENCOUNTER — Ambulatory Visit: Admitting: Occupational Therapy

## 2024-07-26 ENCOUNTER — Encounter: Payer: Self-pay | Admitting: Occupational Therapy

## 2024-07-26 VITALS — BP 117/70 | HR 63

## 2024-07-26 DIAGNOSIS — R2689 Other abnormalities of gait and mobility: Secondary | ICD-10-CM

## 2024-07-26 DIAGNOSIS — R2681 Unsteadiness on feet: Secondary | ICD-10-CM

## 2024-07-26 DIAGNOSIS — I69354 Hemiplegia and hemiparesis following cerebral infarction affecting left non-dominant side: Secondary | ICD-10-CM

## 2024-07-26 DIAGNOSIS — R262 Difficulty in walking, not elsewhere classified: Secondary | ICD-10-CM

## 2024-07-26 DIAGNOSIS — R293 Abnormal posture: Secondary | ICD-10-CM

## 2024-07-26 DIAGNOSIS — M6281 Muscle weakness (generalized): Secondary | ICD-10-CM

## 2024-07-26 NOTE — Therapy (Signed)
 OUTPATIENT PHYSICAL THERAPY NEURO TREATMENT   Patient Name: Richard [name] MRN: [id number] DOB:02-10-1956, 67 y.o., male Today's Date: 07/26/2024  PCP: Ezekiel Charleston, MD REFERRING PROVIDER: Wynne Flatten, MD  END OF SESSION:  PT End of Session - 07/26/24 1100     Visit Number 16    Number of Visits 27   re-cert   Date for Recertification  98/69/73   re-cert; due to potential delay in scheduling   Authorization Type [facility]    Authorization Time Period 05/13/24-09/10/24    Authorization - Number of Visits 15    Progress Note Due on Visit 10    PT Start Time 1100    PT Stop Time 1140    PT Time Calculation (min) 40 min    Equipment Utilized During Treatment Gait belt    Activity Tolerance Patient tolerated treatment well    Behavior During Therapy WFL for tasks assessed/performed            Past Medical History:  Diagnosis Date   Coronary artery disease    a. cath 04/02/2014 3v dx 80-90% in D1, 80-90% mid LAD, 95% apical LAD, 95% distal inferior LV branch of LCx, total occlusion of prox RCA  b). Cath 04/03/2014 DES to mid LAD, DES x3 to prox D1, DAPT indefinitely. LCx lesion untreated   GERD (gastroesophageal reflux disease)    HIV (human immunodeficiency virus infection) (HCC)    Hypertension    Ischemic cardiomyopathy 08/09/2013   EF 20-25 percent by echo in 04/2014, s/p [employer] [place] model [id number] serial number [id number]   Stroke [facility])    Past Surgical History:  Procedure Laterality Date   CARDIAC CATHETERIZATION  04/02/2014   DR BURNARD   ESOPHAGOGASTRODUODENOSCOPY (EGD) WITH PROPOFOL  N/A 09/08/2018   Procedure: ESOPHAGOGASTRODUODENOSCOPY (EGD) WITH PROPOFOL ;  Surgeon: Rollin Dover, MD;  Location: [facility] ENDOSCOPY;  Service: Endoscopy;  Laterality: N/A;   IMPLANTABLE CARDIOVERTER DEFIBRILLATOR IMPLANT N/A 09/10/2014   Procedure: IMPLANTABLE CARDIOVERTER DEFIBRILLATOR IMPLANT;  Surgeon: Jerel Balding, MD;  [facility] model [id number] serial number  [id number]   IR CT HEAD LTD  12/01/2023   IR PERCUTANEOUS ART THROMBECTOMY/INFUSION INTRACRANIAL INC DIAG ANGIO  12/01/2023   IR US  GUIDE VASC ACCESS RIGHT  12/01/2023   LEFT AND RIGHT HEART CATHETERIZATION WITH CORONARY ANGIOGRAM N/A 04/02/2014   Procedure: LEFT AND RIGHT HEART CATHETERIZATION WITH CORONARY ANGIOGRAM;  Surgeon: Debby DELENA Burnard, MD;  Location: [facility] CATH LAB;  Service: Cardiovascular;  Laterality: N/A;   MEDIAL COLLATERAL LIGAMENT REPAIR, KNEE Right 08/25/2015   Procedure: RIGHT THUMB RADICAL COLLATERAL LIGAMENT REPAIR;  Surgeon: Donnice Robinsons, MD;  Location: [facility] OR;  Service: Orthopedics;  Laterality: Right;   PERCUTANEOUS CORONARY STENT INTERVENTION (PCI-S) N/A 04/03/2014   Procedure: PERCUTANEOUS CORONARY STENT INTERVENTION (PCI-S);  Surgeon: Peter M Jordan, MD;  Location: [facility] CATH LAB;  Service: Cardiovascular;  Laterality: N/A;   RADIOLOGY WITH ANESTHESIA N/A 12/01/2023   Procedure: RADIOLOGY WITH ANESTHESIA;  Surgeon: Radiologist, Medication, MD;  Location: [facility] OR;  Service: Radiology;  Laterality: N/A;  BETHANY BRADFORD DILATION N/A 09/08/2018   Procedure: SAVORY DILATION;  Surgeon: Rollin Dover, MD;  Location: [facility] ENDOSCOPY;  Service: Endoscopy;  Laterality: N/A;   Patient Active Problem List   Diagnosis Date Noted   ICD (implantable cardioverter-defibrillator) discharge 03/23/2024   Hyperlipidemia 01/02/2024   CKD stage 3a, GFR 45-59 ml/min (HCC) 01/02/2024   Depression with anxiety 12/27/2023   Chronic systolic congestive heart failure (HCC) 12/23/2023   Right middle cerebral  artery stroke (HCC) 12/06/2023   Acute ischemic right MCA stroke (HCC) 12/01/2023   Schizophrenia, unspecified (HCC) 03/07/2023   Recurrent major depression 03/07/2023   Pseudophakia of left eye 03/07/2023   Acid reflux 03/07/2023   Dyspepsia 03/07/2023   Anxiety state 03/07/2023   Cortical age-related cataract of right eye 03/07/2023   Psychotic disorder (HCC) 03/07/2023   CAD (coronary artery  disease) 03/07/2023   Abdominal mass 07/12/2021   Peripheral vascular disease 02/04/2021   Overweight 01/21/2020   Dysphagia 12/04/2018   Hiatal hernia 04/19/2018   Gastroesophageal reflux disease 04/03/2018   Ischemic congestive cardiomyopathy (HCC) 10/17/2017   Generalized anxiety disorder 10/17/2017   Prediabetes 10/16/2017   Acquired thrombocytopenia 10/16/2017   Chronic kidney disease, stage 3 (HCC) 10/16/2017   Primary erectile dysfunction 10/13/2017   Chronic combined systolic and diastolic heart failure (HCC) 12/30/2015   Traumatic rupture of collateral ligament of other finger at metacarpophalangeal and interphalangeal joint, subsequent encounter 10/30/2015   Traumatic rupture of collateral ligament of other finger at metacarpophalangeal and interphalangeal joint, initial encounter 08/12/2015   VT (ventricular tachycardia) (HCC) 12/31/2014   ICD (implantable cardioverter-defibrillator) in place 09/10/2014   At risk for sudden cardiac death Sep 14, 2014   Systolic heart failure (HCC) 08/09/2014   Coronary artery disease involving native coronary artery of native heart without angina pectoris 04/04/2014   Unstable angina (HCC) 04/03/2014   Cardiomyopathy, ischemic 03/22/2014   History of CVA (cerebrovascular accident) 02/20/2014   Human immunodeficiency virus infection (HCC) 02/20/2014   Essential hypertension 02/20/2014   Hyperlipidemia LDL goal <70 02/20/2014   Depression 02/20/2014   Cerebral infarction (HCC) 08/09/2006    ONSET DATE: 05/15/24 referral    REFERRING DIAG: I63.9 (ICD-10-CM) - Cerebral infarction, unspecified   THERAPY DIAG:  Hemiplegia and hemiparesis following cerebral infarction affecting left non-dominant side (HCC)  Unsteadiness on feet  Abnormal posture  Muscle weakness (generalized)  Difficulty in walking, not elsewhere classified  Other abnormalities of gait and mobility  Rationale for Evaluation and Treatment: Rehabilitation  SUBJECTIVE:                                                                                                                                                                                              SUBJECTIVE STATEMENT:  Pt denies any acute changes since last visit, no falls, no pain today. He felt good after last visit.  Pt accompanied by: significant other, wife Veronica  PERTINENT HISTORY: CAD, GERD, HIV, HTN, ischemic cardiomyopathy, CVA  PAIN:  Are you having pain? No  PRECAUTIONS: Fall and ICD/Pacemaker; L hemi   PATIENT GOALS: to walk and use my  hand   OBJECTIVE:  Note: Objective measures were completed at Evaluation unless otherwise noted.  DIAGNOSTIC FINDINGS: 12/01/23 head CT IMPRESSION: 1. Subtle hypoattenuation involving the right caudate and right lentiform nuclei which may reflect acute infarct. 2. No acute intracranial hemorrhage. 3. Remote infarct involving the right frontal operculum, insula, and superior right temporal lobe. 4. Asymmetric density of the right M1 segment. Recommend correlation with CTA. 5. ASPECTS is 8   GAIT: Findings: Gait Characteristics: step to pattern, decreased arm swing- Left, decreased step length- Right, decreased stance time- Left, decreased stride length, decreased hip/knee flexion- Left, decreased ankle dorsiflexion- Left, circumduction- Left, Left hip hike, trunk rotated posterior- Left, trunk flexed, narrow BOS, and poor foot clearance- Left, Assistive device utilized:Hemi walker, and Level of assistance: CGA and Min A                                                                                                             TREATMENT   Self-Care Vitals:   07/26/24 1102  BP: 117/70  Pulse: 63   BP assessed in RUE in sitting at rest, WNL.    NMR:  Gait pattern: decreased hip/knee flexion- Left, decreased ankle dorsiflexion- Left, circumduction- Left, scissoring, and lateral lean- Right Distance walked: 115 ft with LBQC, 100 ft  with SBQC Assistive device utilized: Counselling psychologist and Quad cane large base Level of assistance: Min A Comments: with L AFO, manual assist to increase weight shift onto LLE; decreased stability with SBQC as compared to [facility]  At ballet bar with fingertip RUE support: Static stance with RLE on green dynadisc with tactile cues for LLE activation 3 x 30 sec Manual assist to weight shift onto LLE  From EOM to [facility]: Sit to stands with min A x 10 reps Mirror for visual feedback, manual assist to maintain weight shift in midline and WB on LLE   PATIENT EDUCATION: Education details: continue HEP, continue to try to stay in midline with sit to stands Person educated: Patient and Spouse Education method: Explanation, Demonstration, Tactile cues, and Verbal cues Education comprehension: verbalized understanding, returned demonstration, verbal cues required, tactile cues required, and needs further education  HOME EXERCISE PROGRAM: Access Code: [email or web address] URL: [email or web address] Date: 06/06/2024 Prepared by: Delon Pop  Exercises - Supine Bridge  - 1 x daily - 7 x weekly - 3 sets - 10 reps - Supine March with Posterior Pelvic Tilt  - 1 x daily - 7 x weekly - 3 sets - 10 reps - Mini Squat with Counter Support  - 1 x daily - 7 x weekly - 3 sets - 8-10 reps - Standing Knee Flexion with Counter Support  - 1 x daily - 7 x weekly - 3 sets - 6-8 reps - Standing March with Counter Support  - 1 x daily - 7 x weekly - 3 sets - 8-10 reps  GOALS: Goals reviewed with patient? Yes  SHORT TERM GOALS: Target date: 06/22/24  Pt will be independent with initial HEP for improved functional strength and balance  Baseline: to be provided  Goal status: MET  2.  Pt will improve gait speed to >/= .44m/s to demonstrate improved community ambulation  Baseline: .20m/s with HW + CGA; .40m/s with LBQC + CGA Goal status: MET  3.  Pt will improve TUG to </= 70 secs to demonstrated  reduced fall risk  Baseline: 98s with HW + MinA; 79s LBQC + CGA Goal status: IN PROGRESS  4.  Pt will improve 5x STS to </= 22 sec to demo improved functional LE strength and balance   Baseline: 30.81 with CGA + R UE; 24/5s no UE + CGA Goal status: IN PRPOGRESS   LONG TERM GOALS: Target date: 07/13/24 (to match cert date)   Pt will be independent with final HEP for improved functional strength and balance  Baseline: to be provided; provided Goal status: MET  Pt will improve gait speed to >/= .21m/s to demonstrate improved community ambulation  Baseline: .48m/s with HW + CGA; .42m/s LBQC + CGA Goal status: IN PROGRESS  Pt will improve TUG to </= 50 secs to demonstrated reduced fall risk  Baseline: 98s with HW + MinA; 66s LBQC + CGA Goal status: IN PROGRESS  Pt will improve 5x STS to </= 15 sec to demo improved functional LE strength and balance   Baseline: 30.81 with CGA + R UE; 24s R UE + CGA Goal status: IN PROGRESS  NEW SHORT TERM GOALS: 08/10/24 Pt will be compliant with updated HEP for improved functional strength and balance   Baseline: to be updated  Goal status: NEW   2. Pt will improve gait speed to >/= .24m/s to demonstrate improved community ambulation  Baseline: .48m/s LBQC + CGA Goal status: NEW  3. Pt will improve TUG to </= 50 secs to demonstrated reduced fall risk  Baseline: 66s LBQC + CGA Goal status: NEW  4. Pt will improve 5x STS to </= 20 sec to demo improved functional LE strength and balance   Baseline:  24s R UE + CGA Goal status: NEW   NEW LONG TERM GOALS: 09/07/24  Pt will be compliant with updated final HEP for improved functional strength and balance   Baseline: to be updated  Goal status: NEW  2. Pt will improve gait speed to >/= .28m/s to demonstrate improved community ambulation  Baseline: .31m/s with HW + CGA Goal status: NEW  3. Pt will improve TUG to </= 40 secs to demonstrated reduced fall risk  Baseline: 66s LBQC + CGA Goal  status: NEW  4. Pt will improve 5x STS to </= 15 sec to demo improved functional LE strength and balance   Baseline: 24s R UE + CGA Goal status: NEW   ASSESSMENT:  CLINICAL IMPRESSION: Emphasis of skilled PT session on continuing to assess vitals, working on gait training with [facility] with progression to [facility], and working on LLE NMR and increased WB on limb. Pt with ongoing gait deviations as noted above. He does exhibit decreased stability with use of SBQC as compared to [facility], benefits from continued use of LBQC. He continues to exhibit decreased WB on L side with gait and during sit to stand transfers, continues to need manual assist to increase WB on L side. He continues to benefit from skilled PT services to work towards increased safety and independence with his functional mobility. Continue POC.    OBJECTIVE IMPAIRMENTS: Abnormal gait, decreased balance, decreased coordination, decreased endurance, decreased knowledge of condition, decreased knowledge of use of DME, decreased ROM, decreased strength, impaired  tone, impaired UE functional use, and postural dysfunction.   ACTIVITY LIMITATIONS: carrying, lifting, bending, standing, squatting, stairs, transfers, bed mobility, locomotion level, and caring for others  PARTICIPATION LIMITATIONS: meal prep, cleaning, interpersonal relationship, driving, shopping, community activity, occupation, and yard work  PERSONAL FACTORS: Age, Fitness, Past/current experiences, Time since onset of injury/illness/exacerbation, Transportation, and 3+ comorbidities: see above are also affecting patient's functional outcome.   REHAB POTENTIAL: Fair time since onset  CLINICAL DECISION MAKING: Stable/uncomplicated  EVALUATION COMPLEXITY: Low  PLAN:  PT FREQUENCY: 2x/week  PT DURATION: other: 7 weeks  PLANNED INTERVENTIONS: [id number]- PT Re-evaluation, 97750- Physical Performance Testing, 97110-Therapeutic exercises, 97530- Therapeutic activity, [id number]-  Neuromuscular re-education, 97535- Self Care, [id number]- Manual therapy, [id number]- Gait training, [phone]- Orthotic Initial, [phone]- Orthotic/Prosthetic subsequent, [phone]- Aquatic Therapy, [phone] (1-2 muscles), [id number] (3+ muscles)- Dry Needling, Patient/Family education, Balance training, Stair training, Taping, Vestibular training, Visual/preceptual remediation/compensation, DME instructions, and Wheelchair mobility training  PLAN FOR NEXT SESSION: HIGT! Continue working with [facility], continue use of L LE, weight shifting in bars with toe taps, improve L hip flexion strength. Facilitate left weight shifting during ambulation/ literally anything to get more weight on the left leg (modified SLS, R LE step ups so he has to spend more time on L, etc); staggered sit to stands, L hip abd strengthening (resisted dot taps)  Update patient and his wife when he should get a LBQC or LRAD   Waddell Southgate, PT Waddell Southgate, PT, DPT, CSRS   07/26/2024, 11:43 AM

## 2024-07-26 NOTE — Therapy (Signed)
 OUTPATIENT OCCUPATIONAL THERAPY NEURO TREATMENT  Patient Name: Richard [name] MRN: [id number] DOB:Feb 21, 1956, 68 y.o., male Today's Date: 07/26/2024  PCP: Richard Ezekiel NOVAK, MD REFERRING PROVIDER: [name], MD  END OF SESSION:  OT End of Session - 07/26/24 1010     Visit Number 5    Number of Visits 15    Date for Recertification  09/08/24    Authorization Type VA approved 15 visits from 06/07/24 - 10/05/24    OT Start Time 1008    OT Stop Time 1055    OT Time Calculation (min) 47 min    Activity Tolerance Patient tolerated treatment well    Behavior During Therapy [facility] for tasks assessed/performed          Past Medical History:  Diagnosis Date   Coronary artery disease    a. cath 04/02/2014 3v dx 80-90% in D1, 80-90% mid LAD, 95% apical LAD, 95% distal inferior LV branch of LCx, total occlusion of prox RCA  b). Cath 04/03/2014 DES to mid LAD, DES x3 to prox D1, DAPT indefinitely. LCx lesion untreated   GERD (gastroesophageal reflux disease)    HIV (human immunodeficiency virus infection) (HCC)    Hypertension    Ischemic cardiomyopathy 08/09/2013   EF 20-25 percent by echo in 04/2014, s/p [employer] [place] model [id number] serial number [id number]   Stroke [facility])    Past Surgical History:  Procedure Laterality Date   CARDIAC CATHETERIZATION  04/02/2014   DR [name]   ESOPHAGOGASTRODUODENOSCOPY (EGD) WITH PROPOFOL  N/A 09/08/2018   Procedure: ESOPHAGOGASTRODUODENOSCOPY (EGD) WITH PROPOFOL ;  Surgeon: Richard Dover, MD;  Location: [facility] ENDOSCOPY;  Service: Endoscopy;  Laterality: N/A;   IMPLANTABLE CARDIOVERTER DEFIBRILLATOR IMPLANT N/A 09/10/2014   Procedure: IMPLANTABLE CARDIOVERTER DEFIBRILLATOR IMPLANT;  Surgeon: Richard Balding, MD;  [facility] model [phone] serial number [id number]   IR CT HEAD LTD  12/01/2023   IR PERCUTANEOUS ART THROMBECTOMY/INFUSION INTRACRANIAL INC DIAG ANGIO  12/01/2023   IR US  GUIDE VASC ACCESS RIGHT  12/01/2023   LEFT AND RIGHT  HEART CATHETERIZATION WITH CORONARY ANGIOGRAM N/A 04/02/2014   Procedure: LEFT AND RIGHT HEART CATHETERIZATION WITH CORONARY ANGIOGRAM;  Surgeon: Richard DELENA Burnard, MD;  Location: [facility] CATH LAB;  Service: Cardiovascular;  Laterality: N/A;   MEDIAL COLLATERAL LIGAMENT REPAIR, KNEE Right 08/25/2015   Procedure: RIGHT THUMB RADICAL COLLATERAL LIGAMENT REPAIR;  Surgeon: Richard Robinsons, MD;  Location: [facility] OR;  Service: Orthopedics;  Laterality: Right;   PERCUTANEOUS CORONARY STENT INTERVENTION (PCI-S) N/A 04/03/2014   Procedure: PERCUTANEOUS CORONARY STENT INTERVENTION (PCI-S);  Surgeon: Richard M Jordan, MD;  Location: [facility] CATH LAB;  Service: Cardiovascular;  Laterality: N/A;   RADIOLOGY WITH ANESTHESIA N/A 12/01/2023   Procedure: RADIOLOGY WITH ANESTHESIA;  Surgeon: Radiologist, Medication, MD;  Location: [facility] OR;  Service: Radiology;  Laterality: N/A;  BETHANY BRADFORD DILATION N/A 09/08/2018   Procedure: SAVORY DILATION;  Surgeon: Richard Dover, MD;  Location: [facility] ENDOSCOPY;  Service: Endoscopy;  Laterality: N/A;   Patient Active Problem List   Diagnosis Date Noted   ICD (implantable cardioverter-defibrillator) discharge 03/23/2024   Hyperlipidemia 01/02/2024   CKD stage 3a, GFR 45-59 ml/min (HCC) 01/02/2024   Depression with anxiety 12/27/2023   Chronic systolic congestive heart failure (HCC) 12/23/2023   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   Acute ischemic right MCA stroke (HCC) 12/01/2023   Schizophrenia, unspecified (HCC) 03/07/2023   Recurrent major depression 03/07/2023   Pseudophakia of left eye 03/07/2023   Acid reflux 03/07/2023  Dyspepsia 03/07/2023   Anxiety state 03/07/2023   Cortical age-related cataract of right eye 03/07/2023   Psychotic disorder (HCC) 03/07/2023   CAD (coronary artery disease) 03/07/2023   Abdominal mass 07/12/2021   Peripheral vascular disease 02/04/2021   Overweight 01/21/2020   Dysphagia 12/04/2018   Hiatal hernia 04/19/2018   Gastroesophageal  reflux disease 04/03/2018   Ischemic congestive cardiomyopathy (HCC) 10/17/2017   Generalized anxiety disorder 10/17/2017   Prediabetes 10/16/2017   Acquired thrombocytopenia 10/16/2017   Chronic kidney disease, stage 3 (HCC) 10/16/2017   Primary erectile dysfunction 10/13/2017   Chronic combined systolic and diastolic heart failure (HCC) 12/30/2015   Traumatic rupture of collateral ligament of other finger at metacarpophalangeal and interphalangeal joint, subsequent encounter 10/30/2015   Traumatic rupture of collateral ligament of other finger at metacarpophalangeal and interphalangeal joint, initial encounter 08/12/2015   VT (ventricular tachycardia) (HCC) 12/31/2014   ICD (implantable cardioverter-defibrillator) in place 09/10/2014   At risk for sudden cardiac death 09/17/2014   Systolic heart failure (HCC) 08/09/2014   Coronary artery disease involving native coronary artery of native heart without angina pectoris 04/04/2014   Unstable angina (HCC) 04/03/2014   Cardiomyopathy, ischemic 03/22/2014   History of CVA (cerebrovascular accident) 02/20/2014   Human immunodeficiency virus infection (HCC) 02/20/2014   Essential hypertension 02/20/2014   Hyperlipidemia LDL goal <70 02/20/2014   Depression 02/20/2014   Cerebral infarction (HCC) 08/09/2006    ONSET DATE: 06/19/2024  REFERRING DIAG: I63.9 (ICD-10-CM) - Cerebral infarction, unspecified  Note:  [facility] Auth# [id number] 06/07/24 - 10/05/24 15 OT visits  THERAPY DIAG:  Hemiplegia and hemiparesis following cerebral infarction affecting left non-dominant side (HCC)  Unsteadiness on feet  Abnormal posture  Muscle weakness (generalized)  Rationale for Evaluation and Treatment: Rehabilitation  SUBJECTIVE:   SUBJECTIVE STATEMENT: Brought in splint for padding Pt accompanied by: significant other (wife)   PERTINENT HISTORY: presented 12/01/23 with L-sided weakness and slurred speech. CTA showed occlusion of the right M1  segment, proximal M2 branch of the right MCA with intraluminal thrombus. S/p mechanical thrombectomy 4/24. CT showed subtle hypoattenuation involving the right caudate and right lentiform nuclei which may reflect acute infarct. MRI pending. PMH: CAD s/p CABG x3 2015, ischemic cardiomyopathy s/p ICD placement, HTN, HLD, HIV, GERD, CVA in 2008 without residual deficits  PRECAUTIONS: Fall and ICD/Pacemaker, no driving  WEIGHT BEARING RESTRICTIONS: No  PAIN:  Are you having pain? Lt hand fluctuates 5/10  FALLS: Has patient fallen in last 6 months? Yes. Number of falls 4; slid off sofa, fell off bed, nearly fell off commode into tub    LIVING ENVIRONMENT: Lives with: lives with their spouse Lives in: one level home Stairs: Yes: External: 1 steps; on right going up Has following equipment at home: Hemi walker, Wheelchair (manual), Shower bench, bed side commode, and Grab bars   PLOF: Independent  PATIENT GOALS: get out of this wheelchair  OBJECTIVE:  Note: Objective measures were completed at Evaluation unless otherwise noted.  HAND DOMINANCE: Right  ADLs:  Eating: assist to cut food Grooming: set up for brushing gums and implants, wife brushes dentures and shaves him (wife always shaved pt even prior to CVA) UB Dressing: mod to max assist LB Dressing: max assist Toileting: mod I  Bathing: mod assist - mostly seated Tub Shower transfers: min assist for leg management on Lt  Equipment: Transfer tub bench, Grab bars, and hand held shower  IADLs: dependent for all IADLS Handwriting: No changes  MOBILITY STATUS: uses w/c mostly, uses hemi  walker some at home   UPPER EXTREMITY ROM:  RUE AROM WNL's  LUE AROM dominated by synergy pattern (goes into some sh abduction, elbow flex, pronation but no isolated movement) No active movement in hand Pt can only tolerate approx 75% passive shoulder flexion   UPPER EXTREMITY MMT:   NT d/t spasticity   HAND FUNCTION: No functional use LT  hand  COORDINATION: No functional use Lt hand  SENSATION: WFL  EDEMA: mild Lt hand  MUSCLE TONE: LUE: Moderate, Hypertonic, and Modifed Ashworth Scale 3 = Considerable increase in muscle tone, passive movement difficult at elbow, 2/4 wrist and fingers  COGNITION: Overall cognitive status: Impaired, decreased memory Wife reports he sometimes doesn't turn the sink off after washing hands  VISION: Subjective report: about the same to me. Got new glasses Baseline vision: Wears glasses all the time but not wearing at evaluation Visual history: cataract sx OU  VISION ASSESSMENT: Not tested    PERCEPTION: Not tested  PRAXIS: Not tested  OBSERVATIONS: dysarthria of speech, reports getting choked especially on water and saliva                                                                                                                             TREATMENT DATE: 07/26/24  Pt brought in resting hand splint for padding distally at edge to cushion in case patient brings up to head while sleeping. Therapist adjusted and added cushioning/padding along edging of distal part of splint  Seated at mat: worked on various wt bearing activities over LUE with body on arm movements (trunk rotation bilaterally, lateral trunk flexion w/ high level ipsilateral reaching RUE, cross body reaching for cones RUE. Progressed to reaching tasks wt bearing over Lt elbow.  Seated to squat position: wt bearing BUE's over chair w/ A/P wt shifts, side to side wt shifts, disengaging RUE to increase wt over LUE, and modified push ups  Seated: low level BUE AA/ROM (for neuro re-educ to LUE) into gravity holding boomwhacker reaching to floor and back to lap w/ cues for posture and scapula retraction as pt brings dowel from lap towards belly button.   Seated: AA/ROM LUE using bar stool for low level closed chain shoulder flex and elbow extension, shoulder ext with elbow flexion.     PATIENT  EDUCATION: Education details: see above Person educated: Patient and Spouse Education method: Explanation, Demonstration, and Verbal cues Education comprehension: verbalized understanding, verbal cues required, and needs further education  HOME EXERCISE PROGRAM: 07/09/24: Initial LUE HEP  07/24/24: memory compensation strategies    GOALS: Goals reviewed with patient? Yes  SHORT TERM GOALS: Target date: 08/09/24  Independent with HEP for LUE Baseline: Goal status: IN PROGRESS  2.  Independent with splint wear and care for Lt hand/wrist (resting hand splint) Baseline:  Goal status: IN PROGRESS  3.  Pt/family to verbalize understanding with hemi dressing/bathing techniques and A/E to increase independence with ADLS Baseline:  Goal status: IN PROGRESS  4.  Pt to verbalize understanding with memory compensation strategies  Baseline:  Goal status: IN PROGRESS   LONG TERM GOALS: Target date: 09/08/24  Independent with updated HEP  Baseline:  Goal status: INITIAL  2.  Pt to consistently perform dressing and bathing with no more than min assist Baseline:  Goal status: INITIAL  3.  Pt to use LUE as stabilizer at least 25% of the time for ADLS/bilateral tasks Baseline:  Goal status: INITIAL  4.  Pt to cut food with A/E (rocker knife)  Baseline:  Goal status: INITIAL  5.  Pt to id 3 pain management strategies for Lt hand I'ly Baseline:  Goal status: INITIAL   ASSESSMENT:  CLINICAL IMPRESSION: Patient is a 68 y.o. male who was seen today for occupational therapy treatment for CVA w/ Lt hemiparesis in April 2025. Pt worked hard today activating Lt side through wt bearing activities and AA/ROM. Hx includes CAD s/p CABG x3 2015, ischemic cardiomyopathy s/p ICD placement, HTN, HLD, HIV, GERD, CVA in 2008 without residual deficits. Patient currently presents below baseline level of functioning demonstrating functional deficits and impairments as noted below. Pt would benefit  from skilled OT services in the outpatient setting to work on impairments as noted below to help pt return to PLOF as able.   SABRA   PERFORMANCE DEFICITS: in functional skills including ADLs, IADLs, coordination, edema, tone, ROM, strength, pain, Fine motor control, Gross motor control, mobility, body mechanics, decreased knowledge of precautions, decreased knowledge of use of DME, and UE functional use, cognitive skills including memory.   IMPAIRMENTS: are limiting patient from ADLs, IADLs, rest and sleep, leisure, and social participation.   CO-MORBIDITIES: may have co-morbidities  that affects occupational performance. Patient will benefit from skilled OT to address above impairments and improve overall function.  MODIFICATION OR ASSISTANCE TO COMPLETE EVALUATION: No modification of tasks or assist necessary to complete an evaluation.  OT OCCUPATIONAL PROFILE AND HISTORY: Detailed assessment: Review of records and additional review of physical, cognitive, psychosocial history related to current functional performance.  CLINICAL DECISION MAKING: Moderate - several treatment options, min-mod task modification necessary  REHAB POTENTIAL: Good  EVALUATION COMPLEXITY: Moderate    PLAN:  OT FREQUENCY: 2x/week  OT DURATION: 8 weeks  PLANNED INTERVENTIONS: 97535 self care/ADL training, 02889 therapeutic exercise, 97530 therapeutic activity, 97112 neuromuscular re-education, 97140 manual therapy, 97113 aquatic therapy, 97018 paraffin, 02960 fluidotherapy, 97010 moist heat, 97010 cryotherapy, 97760 Orthotic Initial, 97763 Orthotic/Prosthetic subsequent, passive range of motion, functional mobility training, visual/perceptual remediation/compensation, energy conservation, patient/family education, and DME and/or AE instructions  RECOMMENDED OTHER SERVICES: none at this time  CONSULTED AND AGREED WITH PLAN OF CARE: Patient and family member/caregiver  PLAN FOR NEXT SESSION: Continue wt bearing  over LUE seated at mat, AA/ROM using barstool or UE Ranger  *NO estim d/t defibrillator   [name] JINNY Roads, OT 07/26/2024, 10:11 AM

## 2024-07-30 ENCOUNTER — Ambulatory Visit: Admitting: Occupational Therapy

## 2024-07-30 ENCOUNTER — Encounter: Payer: Self-pay | Admitting: Speech Pathology

## 2024-07-30 ENCOUNTER — Encounter: Payer: Self-pay | Admitting: Occupational Therapy

## 2024-07-30 ENCOUNTER — Ambulatory Visit: Admitting: Speech Pathology

## 2024-07-30 DIAGNOSIS — I69354 Hemiplegia and hemiparesis following cerebral infarction affecting left non-dominant side: Secondary | ICD-10-CM

## 2024-07-30 DIAGNOSIS — R41841 Cognitive communication deficit: Secondary | ICD-10-CM

## 2024-07-30 DIAGNOSIS — M6281 Muscle weakness (generalized): Secondary | ICD-10-CM

## 2024-07-30 DIAGNOSIS — R1310 Dysphagia, unspecified: Secondary | ICD-10-CM

## 2024-07-30 DIAGNOSIS — R2681 Unsteadiness on feet: Secondary | ICD-10-CM

## 2024-07-30 DIAGNOSIS — R293 Abnormal posture: Secondary | ICD-10-CM

## 2024-07-30 DIAGNOSIS — R471 Dysarthria and anarthria: Secondary | ICD-10-CM

## 2024-07-30 NOTE — Patient Instructions (Signed)
" °  Speak and swallow with INENT  Checkers Chess Connect 4 Uno  Card games Jig saw puzzles Easy cross words Memory match Board games [employer]ino[employer]ong Learn a new game!  Listen to and discuss Ted Talks or Podcasts Read and discuss short articles of interest to you- Take notes on these if memory is a challenge Discuss social media posts Look and discuss photo albums  The best activities to improve cognition are functional, real life activities that are important to you:  Plan a menu Participate in household chores and decisions (with supervision) Participate in managing finances Plan a party, trip or tailgate with all of the details (even if you aren't really going to carry it out) Participate in your hobby as you are able with assistance Manage your texts, emails with supervision if needed. Google search for items (even if you're not really going to buy anything) and compare prices and features Socialize -  however, too many visitors can be overwhelming, so set limits My doctor said I should only visit (or talk) for 20 minutes or I do better when I visit with just 1-2 people at a time for 20 minutes    It's good to use real in-person games, not just apps    "

## 2024-07-30 NOTE — Therapy (Signed)
 " OUTPATIENT SPEECH LANGUAGE PATHOLOGY TREATMENT   Patient Name: Richard [name] MRN: [id number] DOB:[birth year], 68 y.o., male Today's Date: 07/30/2024  PCP: [name], MD REFERRING PROVIDER: [name], MD  END OF SESSION:  End of Session - 07/30/24 0911     Visit Number 4    Number of Visits 25    Date for Recertification  10/03/24    SLP Start Time 0900   fire trucks checking gas   SLP Stop Time  0930    SLP Time Calculation (min) 30 min    Activity Tolerance Patient tolerated treatment well            Past Medical History:  Diagnosis Date   Coronary artery disease    a. cath 04/02/2014 3v dx 80-90% in D1, 80-90% mid LAD, 95% apical LAD, 95% distal inferior LV branch of LCx, total occlusion of prox RCA  b). Cath 04/03/2014 DES to mid LAD, DES x3 to prox D1, DAPT indefinitely. LCx lesion untreated   GERD (gastroesophageal reflux disease)    HIV (human immunodeficiency virus infection) (HCC)    Hypertension    Ischemic cardiomyopathy 08/09/2013   EF 20-25 percent by echo in 04/2014, s/p [employer] [place] model [id number] serial number [id number]   Stroke [facility])    Past Surgical History:  Procedure Laterality Date   CARDIAC CATHETERIZATION  04/02/2014   DR BURNARD   ESOPHAGOGASTRODUODENOSCOPY (EGD) WITH PROPOFOL  N/A 09/08/2018   Procedure: ESOPHAGOGASTRODUODENOSCOPY (EGD) WITH PROPOFOL ;  Surgeon: Rollin Dover, MD;  Location: [facility];  Service: Endoscopy;  Laterality: N/A;   IMPLANTABLE CARDIOVERTER DEFIBRILLATOR IMPLANT N/A 09/10/2014   Procedure: IMPLANTABLE CARDIOVERTER DEFIBRILLATOR IMPLANT;  Surgeon: Jerel Balding, MD;  [facility] model [id number] serial number [id number]   IR CT HEAD LTD  12/01/2023   IR PERCUTANEOUS ART THROMBECTOMY/INFUSION INTRACRANIAL INC DIAG ANGIO  12/01/2023   IR US  GUIDE VASC ACCESS RIGHT  12/01/2023   LEFT AND RIGHT HEART CATHETERIZATION WITH CORONARY ANGIOGRAM N/A 04/02/2014   Procedure: LEFT AND RIGHT HEART  CATHETERIZATION WITH CORONARY ANGIOGRAM;  Surgeon: Debby DELENA Burnard, MD;  Location: [facility] CATH LAB;  Service: Cardiovascular;  Laterality: N/A;   MEDIAL COLLATERAL LIGAMENT REPAIR, KNEE Right 08/25/2015   Procedure: RIGHT THUMB RADICAL COLLATERAL LIGAMENT REPAIR;  Surgeon: Donnice Robinsons, MD;  Location: [facility] OR;  Service: Orthopedics;  Laterality: Right;   PERCUTANEOUS CORONARY STENT INTERVENTION (PCI-S) N/A 04/03/2014   Procedure: PERCUTANEOUS CORONARY STENT INTERVENTION (PCI-S);  Surgeon: Peter M Jordan, MD;  Location: [facility] CATH LAB;  Service: Cardiovascular;  Laterality: N/A;   RADIOLOGY WITH ANESTHESIA N/A 12/01/2023   Procedure: RADIOLOGY WITH ANESTHESIA;  Surgeon: Radiologist, Medication, MD;  Location: [facility] OR;  Service: Radiology;  Laterality: N/A;  BETHANY BRADFORD DILATION N/A 09/08/2018   Procedure: SAVORY DILATION;  Surgeon: Rollin Dover, MD;  Location: [facility];  Service: Endoscopy;  Laterality: N/A;   Patient Active Problem List   Diagnosis Date Noted   ICD (implantable cardioverter-defibrillator) discharge 03/23/2024   Hyperlipidemia 01/02/2024   CKD stage 3a, GFR 45-59 ml/min (HCC) 01/02/2024   Depression with anxiety 12/27/2023   Chronic systolic congestive heart failure (HCC) 12/23/2023   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   Acute ischemic right MCA stroke (HCC) 12/01/2023   Schizophrenia, unspecified (HCC) 03/07/2023   Recurrent major depression 03/07/2023   Pseudophakia of left eye 03/07/2023   Acid reflux 03/07/2023   Dyspepsia 03/07/2023   Anxiety state 03/07/2023   Cortical age-related cataract of right  eye 03/07/2023   Psychotic disorder (HCC) 03/07/2023   CAD (coronary artery disease) 03/07/2023   Abdominal mass 07/12/2021   Peripheral vascular disease 02/04/2021   Overweight 01/21/2020   Dysphagia 12/04/2018   Hiatal hernia 04/19/2018   Gastroesophageal reflux disease 04/03/2018   Ischemic congestive cardiomyopathy (HCC) 10/17/2017    Generalized anxiety disorder 10/17/2017   Prediabetes 10/16/2017   Acquired thrombocytopenia 10/16/2017   Chronic kidney disease, stage 3 (HCC) 10/16/2017   Primary erectile dysfunction 10/13/2017   Chronic combined systolic and diastolic heart failure (HCC) 12/30/2015   Traumatic rupture of collateral ligament of other finger at metacarpophalangeal and interphalangeal joint, subsequent encounter 10/30/2015   Traumatic rupture of collateral ligament of other finger at metacarpophalangeal and interphalangeal joint, initial encounter 08/12/2015   VT (ventricular tachycardia) (HCC) 12/31/2014   ICD (implantable cardioverter-defibrillator) in place 09/10/2014   At risk for sudden cardiac death Sep 10, 2014   Systolic heart failure (HCC) 08/09/2014   Coronary artery disease involving native coronary artery of native heart without angina pectoris 04/04/2014   Unstable angina (HCC) 04/03/2014   Cardiomyopathy, ischemic 03/22/2014   History of CVA (cerebrovascular accident) 02/20/2014   Human immunodeficiency virus infection (HCC) 02/20/2014   Essential hypertension 02/20/2014   Hyperlipidemia LDL goal <70 02/20/2014   Depression 02/20/2014   Cerebral infarction (HCC) 08/09/2006    ONSET DATE: 11/2023  REFERRING DIAG: I63.9 (ICD-10-CM) - Cerebral infarction, unspecified  THERAPY DIAG:  Dysarthria and anarthria  Cognitive communication deficit  Dysphagia, unspecified type  Rationale for Evaluation and Treatment: Rehabilitation  SUBJECTIVE:   SUBJECT.I got choked this morning Pt accompanied by: self and significant other  PERTINENT HISTORY: Pt is a 68 y.o. male who has a recent hx of CVA back in April, 2025. Pt noted residual deficits include dysarthria, cognitive-communication deficits and dysphagia. Pt's wife was present and provided additional hx. Pt's wife notices that pt's speech is low in volume during conversations. Pt recently received HH ST, primarily targeting dysarthria at  sentence and conversation levels. Pt also received ST in CIR 11/2023-12/2023, targeting swallowing, dysarthria, and cognition. Pt currently consumes regular/thin-liquid diet. Pt endorses coughing and choking on secretions on occasion. Pt's wife handles household responsibilities (finances, medication management, etc.). Pt's wife states that pt tends to lose focus during longer tasks. Pt enjoys watching sports at home. PMHx: CAD, GERD, HIV, HTN, ischemic cardiomyopathy, CVA.  PAIN:  Are you having pain? No  FALLS: Has patient fallen in last 6 months?  See PT evaluation for details  LIVING ENVIRONMENT: Lives with: lives with their family and lives with their spouse Lives in: House/apartment  PLOF:  Level of assistance: Comment: Independent with driving and medication management; wife still did finances  Employment: On disability  PATIENT GOALS: To improve thinking skills, swallowing, and speech.  OBJECTIVE:  Note: Objective measures were completed at Evaluation unless otherwise noted.  DIAGNOSTIC FINDINGS: MBSS from 12/08/23 Patient presents with mild oropharyngeal dysphagia. Oral phase is characterized by reduced lingual movement and labial seal resulting in anterior and posterior spillage. Patient independently utilized multiple swallows and digital sweep when necessary to clear oral residuals. Pharyngeal phase is remarkable for reduced epiglottic inversion and pharyngeal stripping wave resulting in mild pharyngeal residuals, mainly present in the vallecula and pyriform sinuses. Penetration present x2 with thin liquids (x1 via straw, x1 during liquid wash), though likely due to premature spillage and mistiming of epiglottic inversion and laryngeal vestibular closure. Pill consumed in puree, requiring additional bites of PO due to suspected esophageal retention. Recommend continuation  of D2/thin liquid diet with small, single sips of liquids. Patient should continue to utilize digital sweep to  clear buccal pocketing of solids. Recommend medications administered whole in puree with additional bites to promote esophogeal clearance.     COGNITION: Overall cognitive status: Impaired Areas of impairment:  Attention: Impaired: Selective Memory: Impaired: Immediate Working Short term Psychologist, Educational function: Impaired: Impulse control, Organization, Planning, and Error awareness Behavior: Impulsive Functional deficits: Medication management, time management.     MOTOR SPEECH: Overall motor speech: impaired Level of impairment: Conversation Respiration: thoracic breathing and diaphragmatic/abdominal breathing Phonation: normal and low vocal intensity Resonance: WFL Articulation: Impaired: conversation Intelligibility: Intelligibility reduced Motor planning: Appears intact Motor speech errors: aware and consistent Interfering components: N/A Effective technique: increased vocal intensity and pacing  ORAL MOTOR EXAMINATION: Overall status: Impaired:   Lingual: Left (Symmetry and Strength) Comments: N/A   STANDARDIZED ASSESSMENTS: SLUMS: 20/30  PATIENT REPORTED OUTCOME MEASURES (PROM): EAT-10: 20/36 and Communication Effectiveness Survey: 11/24                                                                                                                            TREATMENT DATE:   07/24/24: Dysphagia: Pt has been practicing HEP regularly at home. Today he completed lingual resistance exercises and pharyngeal strengthening exercises for optimizing pt's swallow safety and efficiency. Lingual medialization with resistance completed 10/10x with occasional min modeling and verbal cues. Lingual elevation and lateralization with resistance were each completed 10/10x with occasional min A for modeling. Effortful swallows and Masako each completed 10/10x with occasional min A with breaks given every 3 reps of Masako. Trained in strategy of educating family not to talk to you when you  are eating over the holidays, as well as using intent to chew and swallow even with guests or group meals. Spouse verbalizes she will cue him for this as well. Dysarthria: Richard [name] enters without carryover of strategies for intelligible speech. Reviewed these, targeted carryover repeating and generating sentences with 4-5 syllable words - Jash required usual mod verbal cues and modeling initially for over articulation and saying each syllable. In structured description task, Richard [name] carried over slow rate, volume and over articulation 8/10 phrases with occasional min A. Initiated training in speaking and swallowing (and walking) with intent - Richard [name] and his spouse verbalized understanding. Spouse gave an example of when she interrupted Richard [name] as he was getting up from the table and he had a near fall as he lost attention and intent when standing.    07/24/24: Dysphagia: Pt has been practicing HEP regularly at home. SLP reviewed lingual resistance exercises and pharyngeal strengthening exercises for optimizing pt's swallow safety and efficiency. Lingual medialization with resistance completed 10/10x with occasional min modeling and verbal cues. Lingual elevation and lateralization with resistance were each completed 5/5x with occasional min A for modeling. Effortful swallows and Masako each completed 10/10x with occasional min A. Pt notes that he continues to experiencing increased coughing with saliva at night-time.  SLP educated pt about practicing swallow HEP at night before bed to improve secretion management for saliva and reduce coughing. Pt is agreeable andwill try practicing HEP at night. Plan is to continue lingual and pharyngeal strengthening exercises for optimizing swallow safety and efficiency for QOL. Dysarthria: Targeted compensations for dysarthria (over-articulation, increased loudness) in sentence readings. SLP encouraged pt to utilize swallows before speaking to reduce choking on saliva during  speech. Pt utilize a swallow prior to speaking in 90% of opportunities given occasional min verbal A. During 5-7 word sentence readings, pt utilized dysarthria compensations in 90% of opportunities given occasional min verbal A. SLP challenged pt to utilize dysarthria compensations during relevant phrases (personal information). Pt continued to utilize compensations with 90% consistency given occasional min A. Pt is making progress with speech and swallow goals. SLP provided pt homework focused on /s/ sound production in multi-syllabic words and functional phrases; SLP encouraged pt to continue utilizing over-articulation during HEP for maximizing speech intelligibility. Plan is to continue sentence readings and functional phrase readings for improving pt use of dysarthria compensations.   07/16/24: Dysphagia: Initiated HEP for dysphagia and lingual weakness. See Patient Instructions. Lingual elevation and lateralization with resistance exercises completed 15/15x with occasional min modeling and verbal cues. Effortful swallows and Masako completed 10/10x with occasional min A. Targeted more frequent swallowing and swallowing prior to speaking to reduce choking on saliva. With usual verbal cues, Richard [name] demonstrated swallow prior to speaking on structured speech task. He required frequent cues to swallow prior to speaking in conversation. Introduced concept of swallowing with intent, paying attention when eating and drinking  Dysarthria: Targeted compensations for dysarthria in structured speech task generating 3 sentence descriptions - Volume remained WNL with rare min A. Slow rate, over articulation and separating syllables required occasional min modeling and verbal cues. Richard [name] required cueing to ID and correct slurred/unintelligible utterances 3/3x initially. Targeted 3 syllable words - using separate syllables at slow rate to compensate for dysarthria generating sentences with these words -After initial  modeling and instruction, Richard [name] was intelligible in sentences with 3 syllable words 12/12x. Generated list of personally relevant phrases to practice including biographical information he uses with doctors' offices, and most frequent meal orders. At the end of session, Richard [name] Id'd and self correct imprecise speech in conversation with spouse with mod I.  07/11/24: Evaluation completed. No tx was conducted on this date.    PATIENT EDUCATION: Education details: Evaluation results/goal setting. Person educated: Patient and Spouse Education method: Explanation Education comprehension: verbalized understanding   GOALS: Goals reviewed with patient? Yes  SHORT TERM GOALS: Target date: 08/15/2024  Pt will achieve average >70 dB during 8 minute conversation given occasional min A. Baseline: Goal status: ONGOING  2.  Pt will perform pharyngeal and lingual strengthening exercises with 90% consistency when given rare min A. Baseline:  Goal status: ONGOING  3.  Pt and pt's family will utilize at least 1 external memory strategy for maximizing attention during medication management given occasional min A.  Baseline:  Goal status: ONGOING  4.  Pt will display use of aspiration precautions during PO trials of regular/thin-liquids in 90% of opportunities given rare min A Baseline:  Goal status: ONGOING  5.  Pt will achieve 95-97% speech intelligibility during 8 minute conversation with familiar listeners given occasional min A.  Baseline:  Goal status: ONGOING   LONG TERM GOALS: Target date: 10/03/2024  Pt will achieve average >70 dB during a 12 minute conversation given rare min A.  Baseline:  Goal status: ONGOING  2.  Pt will reduce score on EAT-10 PROM, indicating improved self-perception of swallowing function.  Baseline: 20/36 Goal status: ONGOING  3.  Pt and pt's family will describe and demonstrate at least 2 memory strategies for optimizing pt independence with certain ADL  tasks (medication management) given rare min A. Baseline:  Goal status: ONGOING  4.  Pt will improve score on Communicative Effectiveness Survey, indicating speech is optimal for successful communication in different contexts.  Baseline: 11/24 Goal status: ONGOING   ASSESSMENT:  CLINICAL IMPRESSION: Patient is a 68 y.o. male who was seen today for evaluation of motor speech, dysphagia, and cognition. Pt presents with mild-moderate dysarthria characterized by consistent, imprecise articulation and reduced vocal loudness at the conversational level. Pt also presents with mild-moderate cognitive communication deficits primarily characterized by short-term memory impairment, executive function deficits, and impulsive/inattentive behavior. Pt has hx of oropharyngeal dysphagia and currently still experience difficulty with swallowing. According to EAT-10, pt notes that he experiences difficulty with both solids and liquids during swallowing. Recommend ST targeting conversation-level dysarthria tx and cognitive-communication tx focused on establishing external memory aids for optimizing pt communication effectiveness, safety, and independence. In addition, recommend swallow tx focused on pharyngeal strengthening exercises and aspiration precaution strategies to improve pt perception of swallowing difficulties.   OBJECTIVE IMPAIRMENTS: include attention, memory, executive functioning, dysarthria, and dysphagia. These impairments are limiting patient from managing medications, ADLs/IADLs, effectively communicating at home and in community, and safety when swallowing. Factors affecting potential to achieve goals and functional outcome are N/A. Patient will benefit from skilled SLP services to address above impairments and improve overall function.  REHAB POTENTIAL: Good  PLAN:  SLP FREQUENCY: 2x/week  SLP DURATION: 12 weeks  PLANNED INTERVENTIONS: Aspiration precaution training, Pharyngeal  strengthening exercises, Internal/external aids, Functional tasks, SLP instruction and feedback, Patient/family education, [phone] Treatment of speech (30 or 45 min) , and [id number] Treatment of swallowing function    Leita Hoehn MS, CCC-SLP 07/30/2024, 10:41 AM      "

## 2024-07-30 NOTE — Therapy (Signed)
 " OUTPATIENT OCCUPATIONAL THERAPY NEURO TREATMENT  Patient Name: Richard [name] MRN: [id number] DOB:Jan 02, 1956, 68 y.o., male Today's Date: 07/30/2024  PCP: [name], MD REFERRING PROVIDER: [name], MD  END OF SESSION:  OT End of Session - 07/30/24 0935     Visit Number 6    Number of Visits 15    Date for Recertification  09/08/24    Authorization Type VA approved 15 visits from 06/07/24 - 10/05/24    OT Start Time 0933    OT Stop Time 1015    OT Time Calculation (min) 42 min    Activity Tolerance Patient tolerated treatment well    Behavior During Therapy [facility] for tasks assessed/performed          Past Medical History:  Diagnosis Date   Coronary artery disease    a. cath 04/02/2014 3v dx 80-90% in D1, 80-90% mid LAD, 95% apical LAD, 95% distal inferior LV branch of LCx, total occlusion of prox RCA  b). Cath 04/03/2014 DES to mid LAD, DES x3 to prox D1, DAPT indefinitely. LCx lesion untreated   GERD (gastroesophageal reflux disease)    HIV (human immunodeficiency virus infection) (HCC)    Hypertension    Ischemic cardiomyopathy 08/09/2013   EF 20-25 percent by echo in 04/2014, s/p [employer] model [id number] serial number [id number]   Stroke [facility])    Past Surgical History:  Procedure Laterality Date   CARDIAC CATHETERIZATION  04/02/2014   DR BURNARD   ESOPHAGOGASTRODUODENOSCOPY (EGD) WITH PROPOFOL  N/A 09/08/2018   Procedure: ESOPHAGOGASTRODUODENOSCOPY (EGD) WITH PROPOFOL ;  Surgeon: Rollin Dover, MD;  Location: [facility] ENDOSCOPY;  Service: Endoscopy;  Laterality: N/A;   IMPLANTABLE CARDIOVERTER DEFIBRILLATOR IMPLANT N/A 09/10/2014   Procedure: IMPLANTABLE CARDIOVERTER DEFIBRILLATOR IMPLANT;  Surgeon: Jerel Balding, MD;  [facility] [place] model [id number] serial number [id number]   IR CT HEAD LTD  12/01/2023   IR PERCUTANEOUS ART THROMBECTOMY/INFUSION INTRACRANIAL INC DIAG ANGIO  12/01/2023   IR US  GUIDE VASC ACCESS RIGHT  12/01/2023   LEFT AND RIGHT  HEART CATHETERIZATION WITH CORONARY ANGIOGRAM N/A 04/02/2014   Procedure: LEFT AND RIGHT HEART CATHETERIZATION WITH CORONARY ANGIOGRAM;  Surgeon: Debby DELENA Burnard, MD;  Location: [facility] CATH LAB;  Service: Cardiovascular;  Laterality: N/A;   MEDIAL COLLATERAL LIGAMENT REPAIR, KNEE Right 08/25/2015   Procedure: RIGHT THUMB RADICAL COLLATERAL LIGAMENT REPAIR;  Surgeon: Donnice Robinsons, MD;  Location: [facility] OR;  Service: Orthopedics;  Laterality: Right;   PERCUTANEOUS CORONARY STENT INTERVENTION (PCI-S) N/A 04/03/2014   Procedure: PERCUTANEOUS CORONARY STENT INTERVENTION (PCI-S);  Surgeon: Peter M Jordan, MD;  Location: [facility] CATH LAB;  Service: Cardiovascular;  Laterality: N/A;   RADIOLOGY WITH ANESTHESIA N/A 12/01/2023   Procedure: RADIOLOGY WITH ANESTHESIA;  Surgeon: Radiologist, Medication, MD;  Location: [facility] OR;  Service: Radiology;  Laterality: N/A;  BETHANY BRADFORD DILATION N/A 09/08/2018   Procedure: SAVORY DILATION;  Surgeon: Rollin Dover, MD;  Location: [facility] ENDOSCOPY;  Service: Endoscopy;  Laterality: N/A;   Patient Active Problem List   Diagnosis Date Noted   ICD (implantable cardioverter-defibrillator) discharge 03/23/2024   Hyperlipidemia 01/02/2024   CKD stage 3a, GFR 45-59 ml/min (HCC) 01/02/2024   Depression with anxiety 12/27/2023   Chronic systolic congestive heart failure (HCC) 12/23/2023   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   Acute ischemic right MCA stroke (HCC) 12/01/2023   Schizophrenia, unspecified (HCC) 03/07/2023   Recurrent major depression 03/07/2023   Pseudophakia of left eye 03/07/2023   Acid reflux 03/07/2023  Dyspepsia 03/07/2023   Anxiety state 03/07/2023   Cortical age-related cataract of right eye 03/07/2023   Psychotic disorder (HCC) 03/07/2023   CAD (coronary artery disease) 03/07/2023   Abdominal mass 07/12/2021   Peripheral vascular disease 02/04/2021   Overweight 01/21/2020   Dysphagia 12/04/2018   Hiatal hernia 04/19/2018   Gastroesophageal  reflux disease 04/03/2018   Ischemic congestive cardiomyopathy (HCC) 10/17/2017   Generalized anxiety disorder 10/17/2017   Prediabetes 10/16/2017   Acquired thrombocytopenia 10/16/2017   Chronic kidney disease, stage 3 (HCC) 10/16/2017   Primary erectile dysfunction 10/13/2017   Chronic combined systolic and diastolic heart failure (HCC) 12/30/2015   Traumatic rupture of collateral ligament of other finger at metacarpophalangeal and interphalangeal joint, subsequent encounter 10/30/2015   Traumatic rupture of collateral ligament of other finger at metacarpophalangeal and interphalangeal joint, initial encounter 08/12/2015   VT (ventricular tachycardia) (HCC) 12/31/2014   ICD (implantable cardioverter-defibrillator) in place 09/10/2014   At risk for sudden cardiac death 2014/09/05   Systolic heart failure (HCC) 08/09/2014   Coronary artery disease involving native coronary artery of native heart without angina pectoris 04/04/2014   Unstable angina (HCC) 04/03/2014   Cardiomyopathy, ischemic 03/22/2014   History of CVA (cerebrovascular accident) 02/20/2014   Human immunodeficiency virus infection (HCC) 02/20/2014   Essential hypertension 02/20/2014   Hyperlipidemia LDL goal <70 02/20/2014   Depression 02/20/2014   Cerebral infarction (HCC) 08/09/2006    ONSET DATE: 06/19/2024  REFERRING DIAG: I63.9 (ICD-10-CM) - Cerebral infarction, unspecified  Note:  [employer] Auth# [id number] 06/07/24 - 10/05/24 15 OT visits  THERAPY DIAG:  Hemiplegia and hemiparesis following cerebral infarction affecting left non-dominant side (HCC)  Unsteadiness on feet  Abnormal posture  Muscle weakness (generalized)  Rationale for Evaluation and Treatment: Rehabilitation  SUBJECTIVE:   SUBJECTIVE STATEMENT: Wife reports the padding came off resting hand splint - pt asked to bring splint next time Pt accompanied by: significant other (wife)   PERTINENT HISTORY: presented 12/01/23 with L-sided  weakness and slurred speech. CTA showed occlusion of the right M1 segment, proximal M2 branch of the right MCA with intraluminal thrombus. S/p mechanical thrombectomy 4/24. CT showed subtle hypoattenuation involving the right caudate and right lentiform nuclei which may reflect acute infarct. MRI pending. PMH: CAD s/p CABG x3 2015, ischemic cardiomyopathy s/p ICD placement, HTN, HLD, HIV, GERD, CVA in 2008 without residual deficits  PRECAUTIONS: Fall and ICD/Pacemaker, no driving  WEIGHT BEARING RESTRICTIONS: No  PAIN:  Are you having pain? Lt hand fluctuates 5/10  FALLS: Has patient fallen in last 6 months? Yes. Number of falls 4; slid off sofa, fell off bed, nearly fell off commode into tub    LIVING ENVIRONMENT: Lives with: lives with their spouse Lives in: one level home Stairs: Yes: External: 1 steps; on right going up Has following equipment at home: Hemi walker, Wheelchair (manual), Shower bench, bed side commode, and Grab bars   PLOF: Independent  PATIENT GOALS: get out of this wheelchair  OBJECTIVE:  Note: Objective measures were completed at Evaluation unless otherwise noted.  HAND DOMINANCE: Right  ADLs:  Eating: assist to cut food Grooming: set up for brushing gums and implants, wife brushes dentures and shaves him (wife always shaved pt even prior to CVA) UB Dressing: mod to max assist LB Dressing: max assist Toileting: mod I  Bathing: mod assist - mostly seated Tub Shower transfers: min assist for leg management on Lt  Equipment: Transfer tub bench, Grab bars, and hand held shower  IADLs: dependent for all  IADLS Handwriting: No changes  MOBILITY STATUS: uses w/c mostly, uses hemi walker some at home   UPPER EXTREMITY ROM:  RUE AROM WNL's  LUE AROM dominated by synergy pattern (goes into some sh abduction, elbow flex, pronation but no isolated movement) No active movement in hand Pt can only tolerate approx 75% passive shoulder flexion   UPPER EXTREMITY  MMT:   NT d/t spasticity   HAND FUNCTION: No functional use LT hand  COORDINATION: No functional use Lt hand  SENSATION: WFL  EDEMA: mild Lt hand  MUSCLE TONE: LUE: Moderate, Hypertonic, and Modifed Ashworth Scale 3 = Considerable increase in muscle tone, passive movement difficult at elbow, 2/4 wrist and fingers  COGNITION: Overall cognitive status: Impaired, decreased memory Wife reports he sometimes doesn't turn the sink off after washing hands  VISION: Subjective report: about the same to me. Got new glasses Baseline vision: Wears glasses all the time but not wearing at evaluation Visual history: cataract sx OU  VISION ASSESSMENT: Not tested    PERCEPTION: Not tested  PRAXIS: Not tested  OBSERVATIONS: dysarthria of speech, reports getting choked especially on water and saliva                                                                                                                             TREATMENT DATE: 07/30/24  Seated at mat: worked on various wt bearing activities over LUE with body on arm movements (trunk rotation bilaterally, lateral trunk flexion w/ high level ipsilateral reaching RUE, cross body reaching for cones RUE.   Seated: low level BUE AA/ROM (for neuro re-educ to LUE) into gravity holding boomwhacker reaching to floor and back to lap w/ cues for posture and scapula retraction as pt brings dowel from lap towards belly button.   Seated: AA/ROM LUE using bar stool for low level closed chain shoulder flex and elbow extension, shoulder ext with elbow flexion. AA/ROM working in low range scaption ranges w/ cues to prevent Lt shoulder IR and allow trunk to elongate w/ scapula upward rotation  Recommended discussing botox to RUE (biceps, wrist and finger flexors) for spasticity management at next MD appointment on 08/10/24    PATIENT EDUCATION: Education details: see above Person educated: Patient and Spouse Education method: Explanation,  Demonstration, and Verbal cues Education comprehension: verbalized understanding, verbal cues required, and needs further education  HOME EXERCISE PROGRAM: 07/09/24: Initial LUE HEP  07/24/24: memory compensation strategies    GOALS: Goals reviewed with patient? Yes  SHORT TERM GOALS: Target date: 08/09/24  Independent with HEP for LUE Baseline: Goal status: IN PROGRESS  2.  Independent with splint wear and care for Lt hand/wrist (resting hand splint) Baseline:  Goal status: IN PROGRESS  3.  Pt/family to verbalize understanding with hemi dressing/bathing techniques and A/E to increase independence with ADLS Baseline:  Goal status: IN PROGRESS  4.  Pt to verbalize understanding with memory compensation strategies  Baseline:  Goal status: IN PROGRESS  LONG TERM GOALS: Target date: 09/08/24  Independent with updated HEP  Baseline:  Goal status: INITIAL  2.  Pt to consistently perform dressing and bathing with no more than min assist Baseline:  Goal status: INITIAL  3.  Pt to use LUE as stabilizer at least 25% of the time for ADLS/bilateral tasks Baseline:  Goal status: INITIAL  4.  Pt to cut food with A/E (rocker knife)  Baseline:  Goal status: INITIAL  5.  Pt to id 3 pain management strategies for Lt hand I'ly Baseline:  Goal status: INITIAL   ASSESSMENT:  CLINICAL IMPRESSION: Patient is a 68 y.o. male who was seen today for occupational therapy treatment for CVA w/ Lt hemiparesis in April 2025. Pt worked hard today activating Lt side through wt bearing activities and AA/ROM. Hx includes CAD s/p CABG x3 2015, ischemic cardiomyopathy s/p ICD placement, HTN, HLD, HIV, GERD, CVA in 2008 without residual deficits. Patient currently presents below baseline level of functioning demonstrating functional deficits and impairments as noted below. Pt would benefit from skilled OT services in the outpatient setting to work on impairments as noted below to help pt return to  PLOF as able.   SABRA   PERFORMANCE DEFICITS: in functional skills including ADLs, IADLs, coordination, edema, tone, ROM, strength, pain, Fine motor control, Gross motor control, mobility, body mechanics, decreased knowledge of precautions, decreased knowledge of use of DME, and UE functional use, cognitive skills including memory.   IMPAIRMENTS: are limiting patient from ADLs, IADLs, rest and sleep, leisure, and social participation.   CO-MORBIDITIES: may have co-morbidities  that affects occupational performance. Patient will benefit from skilled OT to address above impairments and improve overall function.  MODIFICATION OR ASSISTANCE TO COMPLETE EVALUATION: No modification of tasks or assist necessary to complete an evaluation.  OT OCCUPATIONAL PROFILE AND HISTORY: Detailed assessment: Review of records and additional review of physical, cognitive, psychosocial history related to current functional performance.  CLINICAL DECISION MAKING: Moderate - several treatment options, min-mod task modification necessary  REHAB POTENTIAL: Good  EVALUATION COMPLEXITY: Moderate    PLAN:  OT FREQUENCY: 2x/week  OT DURATION: 8 weeks  PLANNED INTERVENTIONS: 97535 self care/ADL training, 02889 therapeutic exercise, 97530 therapeutic activity, 97112 neuromuscular re-education, 97140 manual therapy, 97113 aquatic therapy, 97018 paraffin, 02960 fluidotherapy, 97010 moist heat, 97010 cryotherapy, 97760 Orthotic Initial, 97763 Orthotic/Prosthetic subsequent, passive range of motion, functional mobility training, visual/perceptual remediation/compensation, energy conservation, patient/family education, and DME and/or AE instructions  RECOMMENDED OTHER SERVICES: none at this time  CONSULTED AND AGREED WITH PLAN OF CARE: Patient and family member/caregiver  PLAN FOR NEXT SESSION:  re-apply padding to edge of splint (may need to heat tacky side to stick better) or use moleskin, ADLS, try UBE w/ Lt hand wrapped  - however if too much compensations or painful then stop UBE  *NO estim d/t defibrillator   Burnard JINNY Roads, OT 07/30/2024, 9:36 AM           "

## 2024-08-01 ENCOUNTER — Ambulatory Visit: Admitting: Occupational Therapy

## 2024-08-01 ENCOUNTER — Ambulatory Visit: Admitting: Physical Therapy

## 2024-08-01 VITALS — BP 111/60 | HR 54

## 2024-08-01 DIAGNOSIS — M6281 Muscle weakness (generalized): Secondary | ICD-10-CM

## 2024-08-01 DIAGNOSIS — M79642 Pain in left hand: Secondary | ICD-10-CM

## 2024-08-01 DIAGNOSIS — R293 Abnormal posture: Secondary | ICD-10-CM

## 2024-08-01 DIAGNOSIS — I69354 Hemiplegia and hemiparesis following cerebral infarction affecting left non-dominant side: Secondary | ICD-10-CM

## 2024-08-01 DIAGNOSIS — I69398 Other sequelae of cerebral infarction: Secondary | ICD-10-CM

## 2024-08-01 DIAGNOSIS — R2689 Other abnormalities of gait and mobility: Secondary | ICD-10-CM

## 2024-08-01 DIAGNOSIS — R262 Difficulty in walking, not elsewhere classified: Secondary | ICD-10-CM

## 2024-08-01 DIAGNOSIS — R2681 Unsteadiness on feet: Secondary | ICD-10-CM

## 2024-08-01 NOTE — Therapy (Signed)
 " OUTPATIENT PHYSICAL THERAPY NEURO TREATMENT   Patient Name: Richard [name] MRN: [id number] DOB:01-16-1956, 68 y.o., male Today's Date: 08/01/2024  PCP: Ezekiel Charleston, MD REFERRING PROVIDER: Wynne Flatten, MD  END OF SESSION:  PT End of Session - 08/01/24 0801     Visit Number 17    Number of Visits 27   re-cert   Date for Recertification  98/69/73   re-cert; due to potential delay in scheduling   Authorization Type [facility]    Authorization Time Period 08/01/24-11/29/24    Authorization - Visit Number 1    Authorization - Number of Visits 15    Progress Note Due on Visit 20    PT Start Time 0800    PT Stop Time 0845    PT Time Calculation (min) 45 min    Equipment Utilized During Treatment Gait belt    Activity Tolerance Patient tolerated treatment well    Behavior During Therapy WFL for tasks assessed/performed             Past Medical History:  Diagnosis Date   Coronary artery disease    a. cath 04/02/2014 3v dx 80-90% in D1, 80-90% mid LAD, 95% apical LAD, 95% distal inferior LV branch of LCx, total occlusion of prox RCA  b). Cath 04/03/2014 DES to mid LAD, DES x3 to prox D1, DAPT indefinitely. LCx lesion untreated   GERD (gastroesophageal reflux disease)    HIV (human immunodeficiency virus infection) (HCC)    Hypertension    Ischemic cardiomyopathy 08/09/2013   EF 20-25 percent by echo in 04/2014, s/p [employer] [place] model [id number] serial number [id number]   Stroke [facility])    Past Surgical History:  Procedure Laterality Date   CARDIAC CATHETERIZATION  04/02/2014   DR BURNARD   ESOPHAGOGASTRODUODENOSCOPY (EGD) WITH PROPOFOL  N/A 09/08/2018   Procedure: ESOPHAGOGASTRODUODENOSCOPY (EGD) WITH PROPOFOL ;  Surgeon: Rollin Dover, MD;  Location: [facility];  Service: Endoscopy;  Laterality: N/A;   IMPLANTABLE CARDIOVERTER DEFIBRILLATOR IMPLANT N/A 09/10/2014   Procedure: IMPLANTABLE CARDIOVERTER DEFIBRILLATOR IMPLANT;  Surgeon: Jerel Balding, MD;  [facility] [place] model [id number] serial number [id number]   IR CT HEAD LTD  12/01/2023   IR PERCUTANEOUS ART THROMBECTOMY/INFUSION INTRACRANIAL INC DIAG ANGIO  12/01/2023   IR US  GUIDE VASC ACCESS RIGHT  12/01/2023   LEFT AND RIGHT HEART CATHETERIZATION WITH CORONARY ANGIOGRAM N/A 04/02/2014   Procedure: LEFT AND RIGHT HEART CATHETERIZATION WITH CORONARY ANGIOGRAM;  Surgeon: Debby DELENA Burnard, MD;  Location: [facility] CATH LAB;  Service: Cardiovascular;  Laterality: N/A;   MEDIAL COLLATERAL LIGAMENT REPAIR, KNEE Right 08/25/2015   Procedure: RIGHT THUMB RADICAL COLLATERAL LIGAMENT REPAIR;  Surgeon: Donnice Robinsons, MD;  Location: [facility] OR;  Service: Orthopedics;  Laterality: Right;   PERCUTANEOUS CORONARY STENT INTERVENTION (PCI-S) N/A 04/03/2014   Procedure: PERCUTANEOUS CORONARY STENT INTERVENTION (PCI-S);  Surgeon: Peter M Jordan, MD;  Location: [facility] CATH LAB;  Service: Cardiovascular;  Laterality: N/A;   RADIOLOGY WITH ANESTHESIA N/A 12/01/2023   Procedure: RADIOLOGY WITH ANESTHESIA;  Surgeon: Radiologist, Medication, MD;  Location: [facility] OR;  Service: Radiology;  Laterality: N/A;  BETHANY BRADFORD DILATION N/A 09/08/2018   Procedure: SAVORY DILATION;  Surgeon: Rollin Dover, MD;  Location: [facility];  Service: Endoscopy;  Laterality: N/A;   Patient Active Problem List   Diagnosis Date Noted   ICD (implantable cardioverter-defibrillator) discharge 03/23/2024   Hyperlipidemia 01/02/2024   CKD stage 3a, GFR 45-59 ml/min (HCC) 01/02/2024   Depression with anxiety 12/27/2023   Chronic systolic  congestive heart failure (HCC) 12/23/2023   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   Acute ischemic right MCA stroke (HCC) 12/01/2023   Schizophrenia, unspecified (HCC) 03/07/2023   Recurrent major depression 03/07/2023   Pseudophakia of left eye 03/07/2023   Acid reflux 03/07/2023   Dyspepsia 03/07/2023   Anxiety state 03/07/2023   Cortical age-related cataract of right eye 03/07/2023   Psychotic disorder (HCC)  03/07/2023   CAD (coronary artery disease) 03/07/2023   Abdominal mass 07/12/2021   Peripheral vascular disease 02/04/2021   Overweight 01/21/2020   Dysphagia 12/04/2018   Hiatal hernia 04/19/2018   Gastroesophageal reflux disease 04/03/2018   Ischemic congestive cardiomyopathy (HCC) 10/17/2017   Generalized anxiety disorder 10/17/2017   Prediabetes 10/16/2017   Acquired thrombocytopenia 10/16/2017   Chronic kidney disease, stage 3 (HCC) 10/16/2017   Primary erectile dysfunction 10/13/2017   Chronic combined systolic and diastolic heart failure (HCC) 12/30/2015   Traumatic rupture of collateral ligament of other finger at metacarpophalangeal and interphalangeal joint, subsequent encounter 10/30/2015   Traumatic rupture of collateral ligament of other finger at metacarpophalangeal and interphalangeal joint, initial encounter 08/12/2015   VT (ventricular tachycardia) (HCC) 12/31/2014   ICD (implantable cardioverter-defibrillator) in place 09/10/2014   At risk for sudden cardiac death September 18, 2014   Systolic heart failure (HCC) 08/09/2014   Coronary artery disease involving native coronary artery of native heart without angina pectoris 04/04/2014   Unstable angina (HCC) 04/03/2014   Cardiomyopathy, ischemic 03/22/2014   History of CVA (cerebrovascular accident) 02/20/2014   Human immunodeficiency virus infection (HCC) 02/20/2014   Essential hypertension 02/20/2014   Hyperlipidemia LDL goal <70 02/20/2014   Depression 02/20/2014   Cerebral infarction (HCC) 08/09/2006    ONSET DATE: 05/15/24 referral    REFERRING DIAG: I63.9 (ICD-10-CM) - Cerebral infarction, unspecified   THERAPY DIAG:  Hemiplegia and hemiparesis following cerebral infarction affecting left non-dominant side (HCC)  Unsteadiness on feet  Abnormal posture  Muscle weakness (generalized)  Difficulty in walking, not elsewhere classified  Other abnormalities of gait and mobility  Rationale for Evaluation and  Treatment: Rehabilitation  SUBJECTIVE:                                                                                                                                                                                             SUBJECTIVE STATEMENT:  Pt denies any acute changes since last visit, no falls, no pain today. Walking is going better at home.  Pt accompanied by: significant other, wife Richard [name]  PERTINENT HISTORY: CAD, GERD, HIV, HTN, ischemic cardiomyopathy, CVA  PAIN:  Are you having pain? No  PRECAUTIONS: Fall and ICD/Pacemaker; L  hemi   PATIENT GOALS: to walk and use my hand   OBJECTIVE:  Note: Objective measures were completed at Evaluation unless otherwise noted.  DIAGNOSTIC FINDINGS: 12/01/23 head CT IMPRESSION: 1. Subtle hypoattenuation involving the right caudate and right lentiform nuclei which may reflect acute infarct. 2. No acute intracranial hemorrhage. 3. Remote infarct involving the right frontal operculum, insula, and superior right temporal lobe. 4. Asymmetric density of the right M1 segment. Recommend correlation with CTA. 5. ASPECTS is 8   GAIT: Findings: Gait Characteristics: step to pattern, decreased arm swing- Left, decreased step length- Right, decreased stance time- Left, decreased stride length, decreased hip/knee flexion- Left, decreased ankle dorsiflexion- Left, circumduction- Left, Left hip hike, trunk rotated posterior- Left, trunk flexed, narrow BOS, and poor foot clearance- Left, Assistive device utilized:Hemi walker, and Level of assistance: CGA and Min A                                                                                                             TREATMENT   Self-Care Vitals:   08/01/24 0805  BP: 111/60  Pulse: (!) 54    BP assessed in RUE in sitting at rest, WNL.   NMR:  Gait pattern: decreased hip/knee flexion- Left, decreased ankle dorsiflexion- Left, circumduction- Left, scissoring, and lateral lean-  Right Distance walked: 130 ft, 75 ft Assistive device utilized: Quad cane small base and Quad cane large base Level of assistance: Min A Comments: with L AFO, manual assist to increase weight shift onto LLE  At ballet bar with RUE support: 6 Blaze pods on random setting in a straight line at ballet bar with RUE support for improved hip strengthening, SLS.  Performed on 2 minute intervals with 30 rest periods.  Pt requires min A guarding. Round 1:  4 hits. Round 2:  7 hits. Round 3:  7 (almost 8) hits. Notable errors/deficits:  decreased stability with SLS on LLE, increased time needed for lateral sidestepping 6 Blaze pods on random setting with 4 in semi-circle on the floor and 2 on the mirror for improved visual scanning, dual tasking, and SLS stability/balance.  Performed on 2 minute intervals with 30 rest periods.  Pt requires min A guarding. Round 1:  15 hits. Round 2:  16 hits. Round 3:  19 hits. Notable errors/deficits:  decreased stability with SLS on LLE, increased time needed for visual scanning to find pods placed on mirror    PATIENT EDUCATION: Education details: continue HEP Person educated: Patient and Spouse Education method: Explanation, Demonstration, Tactile cues, and Verbal cues Education comprehension: verbalized understanding, returned demonstration, verbal cues required, tactile cues required, and needs further education  HOME EXERCISE PROGRAM: Access Code: [email or web address] URL: [email or web address] Date: 06/06/2024 Prepared by: Delon Pop  Exercises - Supine Bridge  - 1 x daily - 7 x weekly - 3 sets - 10 reps - Supine March with Posterior Pelvic Tilt  - 1 x daily - 7 x weekly - 3 sets - 10 reps - Mini Squat with Counter Support  - 1 x daily -  7 x weekly - 3 sets - 8-10 reps - Standing Knee Flexion with Counter Support  - 1 x daily - 7 x weekly - 3 sets - 6-8 reps - Standing March with Counter Support  - 1 x daily - 7 x weekly - 3 sets - 8-10  reps  GOALS: Goals reviewed with patient? Yes  SHORT TERM GOALS: Target date: 06/22/24  Pt will be independent with initial HEP for improved functional strength and balance  Baseline: to be provided  Goal status: MET  2.  Pt will improve gait speed to >/= .38m/s to demonstrate improved community ambulation  Baseline: .96m/s with HW + CGA; .64m/s with LBQC + CGA Goal status: MET  3.  Pt will improve TUG to </= 70 secs to demonstrated reduced fall risk  Baseline: 98s with HW + MinA; 79s LBQC + CGA Goal status: IN PROGRESS  4.  Pt will improve 5x STS to </= 22 sec to demo improved functional LE strength and balance   Baseline: 30.81 with CGA + R UE; 24/5s no UE + CGA Goal status: IN PRPOGRESS   LONG TERM GOALS: Target date: 07/13/24 (to match cert date)   Pt will be independent with final HEP for improved functional strength and balance  Baseline: to be provided; provided Goal status: MET  Pt will improve gait speed to >/= .68m/s to demonstrate improved community ambulation  Baseline: .74m/s with HW + CGA; .73m/s LBQC + CGA Goal status: IN PROGRESS  Pt will improve TUG to </= 50 secs to demonstrated reduced fall risk  Baseline: 98s with HW + MinA; 66s LBQC + CGA Goal status: IN PROGRESS  Pt will improve 5x STS to </= 15 sec to demo improved functional LE strength and balance   Baseline: 30.81 with CGA + R UE; 24s R UE + CGA Goal status: IN PROGRESS  NEW SHORT TERM GOALS: 08/10/24 Pt will be compliant with updated HEP for improved functional strength and balance   Baseline: to be updated  Goal status: NEW   2. Pt will improve gait speed to >/= .29m/s to demonstrate improved community ambulation  Baseline: .24m/s LBQC + CGA Goal status: NEW  3. Pt will improve TUG to </= 50 secs to demonstrated reduced fall risk  Baseline: 66s LBQC + CGA Goal status: NEW  4. Pt will improve 5x STS to </= 20 sec to demo improved functional LE strength and balance   Baseline:  24s  R UE + CGA Goal status: NEW   NEW LONG TERM GOALS: 09/07/24  Pt will be compliant with updated final HEP for improved functional strength and balance   Baseline: to be updated  Goal status: NEW  2. Pt will improve gait speed to >/= .36m/s to demonstrate improved community ambulation  Baseline: .65m/s with HW + CGA Goal status: NEW  3. Pt will improve TUG to </= 40 secs to demonstrated reduced fall risk  Baseline: 66s LBQC + CGA Goal status: NEW  4. Pt will improve 5x STS to </= 15 sec to demo improved functional LE strength and balance   Baseline: 24s R UE + CGA Goal status: NEW   ASSESSMENT:  CLINICAL IMPRESSION: Emphasis of skilled PT session on continuing to assess vitals, working on gait training with [facility], and working on LLE NMR and increased WB on limb with SLS tasks with Blaze pods. Pt with ongoing gait deviations as noted above. He does exhibit decreased stability with SLS on his LLE as compared to  his RLE and benefits from continued practice of this. He continues to benefit from skilled PT services to work towards increased safety and independence with his functional mobility. Continue POC.    OBJECTIVE IMPAIRMENTS: Abnormal gait, decreased balance, decreased coordination, decreased endurance, decreased knowledge of condition, decreased knowledge of use of DME, decreased ROM, decreased strength, impaired tone, impaired UE functional use, and postural dysfunction.   ACTIVITY LIMITATIONS: carrying, lifting, bending, standing, squatting, stairs, transfers, bed mobility, locomotion level, and caring for others  PARTICIPATION LIMITATIONS: meal prep, cleaning, interpersonal relationship, driving, shopping, community activity, occupation, and yard work  PERSONAL FACTORS: Age, Fitness, Past/current experiences, Time since onset of injury/illness/exacerbation, Transportation, and 3+ comorbidities: see above are also affecting patient's functional outcome.   REHAB POTENTIAL: Fair  time since onset  CLINICAL DECISION MAKING: Stable/uncomplicated  EVALUATION COMPLEXITY: Low  PLAN:  PT FREQUENCY: 2x/week  PT DURATION: other: 7 weeks  PLANNED INTERVENTIONS: [id number]- PT Re-evaluation, 97750- Physical Performance Testing, 97110-Therapeutic exercises, 97530- Therapeutic activity, [id number]- Neuromuscular re-education, 97535- Self Care, [id number]- Manual therapy, [id number]- Gait training, [id number]- Orthotic Initial, [phone]- Orthotic/Prosthetic subsequent, [phone]- Aquatic Therapy, [phone] (1-2 muscles), [id number] (3+ muscles)- Dry Needling, Patient/Family education, Balance training, Stair training, Taping, Vestibular training, Visual/preceptual remediation/compensation, DME instructions, and Wheelchair mobility training  PLAN FOR NEXT SESSION: HIGT! Continue working with [facility], continue use of L LE, weight shifting in bars with toe taps, improve L hip flexion strength. Facilitate left weight shifting during ambulation/ literally anything to get more weight on the left leg (modified SLS, R LE step ups so he has to spend more time on L, etc); staggered sit to stands, L hip abd strengthening (resisted dot taps)  Update patient and his wife when he should get a LBQC or LRAD   Waddell Southgate, PT Waddell Southgate, PT, DPT, CSRS   08/01/2024, 8:46 AM        "

## 2024-08-01 NOTE — Therapy (Signed)
 " OUTPATIENT OCCUPATIONAL THERAPY NEURO TREATMENT  Patient Name: Richard [name] MRN: [id number] DOB:November 16, 1955, 68 y.o., male Today's Date: 08/01/2024  PCP: [name], MD REFERRING PROVIDER: [name], MD  END OF SESSION:  OT End of Session - 08/01/24 0848     Visit Number 7    Number of Visits 15    Date for Recertification  09/08/24    Authorization Type VA approved 15 visits from 06/07/24 - 10/05/24    OT Start Time 0848    OT Stop Time 0928    OT Time Calculation (min) 40 min    Activity Tolerance Patient tolerated treatment well    Behavior During Therapy [facility] for tasks assessed/performed          Past Medical History:  Diagnosis Date   Coronary artery disease    a. cath 04/02/2014 3v dx 80-90% in D1, 80-90% mid LAD, 95% apical LAD, 95% distal inferior LV branch of LCx, total occlusion of prox RCA  b). Cath 04/03/2014 DES to mid LAD, DES x3 to prox D1, DAPT indefinitely. LCx lesion untreated   GERD (gastroesophageal reflux disease)    HIV (human immunodeficiency virus infection) (HCC)    Hypertension    Ischemic cardiomyopathy 08/09/2013   EF 20-25 percent by echo in 04/2014, s/p [employer] model [id number] serial number [id number]   Stroke [facility])    Past Surgical History:  Procedure Laterality Date   CARDIAC CATHETERIZATION  04/02/2014   DR BURNARD   ESOPHAGOGASTRODUODENOSCOPY (EGD) WITH PROPOFOL  N/A 09/08/2018   Procedure: ESOPHAGOGASTRODUODENOSCOPY (EGD) WITH PROPOFOL ;  Surgeon: Rollin Dover, MD;  Location: [facility] ENDOSCOPY;  Service: Endoscopy;  Laterality: N/A;   IMPLANTABLE CARDIOVERTER DEFIBRILLATOR IMPLANT N/A 09/10/2014   Procedure: IMPLANTABLE CARDIOVERTER DEFIBRILLATOR IMPLANT;  Surgeon: Jerel Balding, MD;  [facility] model [phone] serial number [id number]   IR CT HEAD LTD  12/01/2023   IR PERCUTANEOUS ART THROMBECTOMY/INFUSION INTRACRANIAL INC DIAG ANGIO  12/01/2023   IR US  GUIDE VASC ACCESS RIGHT  12/01/2023   LEFT AND RIGHT  HEART CATHETERIZATION WITH CORONARY ANGIOGRAM N/A 04/02/2014   Procedure: LEFT AND RIGHT HEART CATHETERIZATION WITH CORONARY ANGIOGRAM;  Surgeon: Debby DELENA Burnard, MD;  Location: [facility] CATH LAB;  Service: Cardiovascular;  Laterality: N/A;   MEDIAL COLLATERAL LIGAMENT REPAIR, KNEE Right 08/25/2015   Procedure: RIGHT THUMB RADICAL COLLATERAL LIGAMENT REPAIR;  Surgeon: Donnice Robinsons, MD;  Location: [facility] OR;  Service: Orthopedics;  Laterality: Right;   PERCUTANEOUS CORONARY STENT INTERVENTION (PCI-S) N/A 04/03/2014   Procedure: PERCUTANEOUS CORONARY STENT INTERVENTION (PCI-S);  Surgeon: Peter M Jordan, MD;  Location: [facility] CATH LAB;  Service: Cardiovascular;  Laterality: N/A;   RADIOLOGY WITH ANESTHESIA N/A 12/01/2023   Procedure: RADIOLOGY WITH ANESTHESIA;  Surgeon: Radiologist, Medication, MD;  Location: [facility] OR;  Service: Radiology;  Laterality: N/A;  BETHANY BRADFORD DILATION N/A 09/08/2018   Procedure: SAVORY DILATION;  Surgeon: Rollin Dover, MD;  Location: [facility] ENDOSCOPY;  Service: Endoscopy;  Laterality: N/A;   Patient Active Problem List   Diagnosis Date Noted   ICD (implantable cardioverter-defibrillator) discharge 03/23/2024   Hyperlipidemia 01/02/2024   CKD stage 3a, GFR 45-59 ml/min (HCC) 01/02/2024   Depression with anxiety 12/27/2023   Chronic systolic congestive heart failure (HCC) 12/23/2023   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   Acute ischemic right MCA stroke (HCC) 12/01/2023   Schizophrenia, unspecified (HCC) 03/07/2023   Recurrent major depression 03/07/2023   Pseudophakia of left eye 03/07/2023   Acid reflux 03/07/2023  Dyspepsia 03/07/2023   Anxiety state 03/07/2023   Cortical age-related cataract of right eye 03/07/2023   Psychotic disorder (HCC) 03/07/2023   CAD (coronary artery disease) 03/07/2023   Abdominal mass 07/12/2021   Peripheral vascular disease 02/04/2021   Overweight 01/21/2020   Dysphagia 12/04/2018   Hiatal hernia 04/19/2018   Gastroesophageal  reflux disease 04/03/2018   Ischemic congestive cardiomyopathy (HCC) 10/17/2017   Generalized anxiety disorder 10/17/2017   Prediabetes 10/16/2017   Acquired thrombocytopenia 10/16/2017   Chronic kidney disease, stage 3 (HCC) 10/16/2017   Primary erectile dysfunction 10/13/2017   Chronic combined systolic and diastolic heart failure (HCC) 12/30/2015   Traumatic rupture of collateral ligament of other finger at metacarpophalangeal and interphalangeal joint, subsequent encounter 10/30/2015   Traumatic rupture of collateral ligament of other finger at metacarpophalangeal and interphalangeal joint, initial encounter 08/12/2015   VT (ventricular tachycardia) (HCC) 12/31/2014   ICD (implantable cardioverter-defibrillator) in place 09/10/2014   At risk for sudden cardiac death 08/26/14   Systolic heart failure (HCC) 08/09/2014   Coronary artery disease involving native coronary artery of native heart without angina pectoris 04/04/2014   Unstable angina (HCC) 04/03/2014   Cardiomyopathy, ischemic 03/22/2014   History of CVA (cerebrovascular accident) 02/20/2014   Human immunodeficiency virus infection (HCC) 02/20/2014   Essential hypertension 02/20/2014   Hyperlipidemia LDL goal <70 02/20/2014   Depression 02/20/2014   Cerebral infarction (HCC) 08/09/2006    ONSET DATE: 06/19/2024  REFERRING DIAG: I63.9 (ICD-10-CM) - Cerebral infarction, unspecified  Note:  [employer] Auth# [id number] 06/07/24 - 10/05/24 15 OT visits  THERAPY DIAG:  Hemiplegia and hemiparesis following cerebral infarction affecting left non-dominant side (HCC)  Muscle weakness (generalized)  Pain in left hand  Spasticity as late effect of cerebrovascular accident (CVA)  Rationale for Evaluation and Treatment: Rehabilitation  SUBJECTIVE:   SUBJECTIVE STATEMENT: Wife reports the pt has hit himself in the head with his resting hand splint and requests padding at the distal edge.   Pt accompanied by: significant  other (wife)   PERTINENT HISTORY: presented 12/01/23 with L-sided weakness and slurred speech. CTA showed occlusion of the right M1 segment, proximal M2 branch of the right MCA with intraluminal thrombus. S/p mechanical thrombectomy 4/24. CT showed subtle hypoattenuation involving the right caudate and right lentiform nuclei which may reflect acute infarct. MRI pending. PMH: CAD s/p CABG x3 2015, ischemic cardiomyopathy s/p ICD placement, HTN, HLD, HIV, GERD, CVA in 2008 without residual deficits  PRECAUTIONS: Fall and ICD/Pacemaker, no driving  WEIGHT BEARING RESTRICTIONS: No  PAIN:  Are you having pain? Lt hand fluctuates 5/10  FALLS: Has patient fallen in last 6 months? Yes. Number of falls 4; slid off sofa, fell off bed, nearly fell off commode into tub    LIVING ENVIRONMENT: Lives with: lives with their spouse Lives in: one level home Stairs: Yes: External: 1 steps; on right going up Has following equipment at home: Hemi walker, Wheelchair (manual), Shower bench, bed side commode, and Grab bars   PLOF: Independent  PATIENT GOALS: get out of this wheelchair  OBJECTIVE:  Note: Objective measures were completed at Evaluation unless otherwise noted.  HAND DOMINANCE: Right  ADLs:  Eating: assist to cut food Grooming: set up for brushing gums and implants, wife brushes dentures and shaves him (wife always shaved pt even prior to CVA) UB Dressing: mod to max assist LB Dressing: max assist Toileting: mod I  Bathing: mod assist - mostly seated Tub Shower transfers: min assist for leg management on Lt  Equipment:  Transfer tub bench, Grab bars, and hand held shower  IADLs: dependent for all IADLS Handwriting: No changes  MOBILITY STATUS: uses w/c mostly, uses hemi walker some at home   UPPER EXTREMITY ROM:  RUE AROM WNL's  LUE AROM dominated by synergy pattern (goes into some sh abduction, elbow flex, pronation but no isolated movement) No active movement in hand Pt can only  tolerate approx 75% passive shoulder flexion   UPPER EXTREMITY MMT:   NT d/t spasticity   HAND FUNCTION: No functional use LT hand  COORDINATION: No functional use Lt hand  SENSATION: WFL  EDEMA: mild Lt hand  MUSCLE TONE: LUE: Moderate, Hypertonic, and Modifed Ashworth Scale 3 = Considerable increase in muscle tone, passive movement difficult at elbow, 2/4 wrist and fingers  COGNITION: Overall cognitive status: Impaired, decreased memory Wife reports he sometimes doesn't turn the sink off after washing hands  VISION: Subjective report: about the same to me. Got new glasses Baseline vision: Wears glasses all the time but not wearing at evaluation Visual history: cataract sx OU  VISION ASSESSMENT: Not tested    PERCEPTION: Not tested  PRAXIS: Not tested  OBSERVATIONS: dysarthria of speech, reports getting choked especially on water and saliva                                                                                                                           TREATMENT: - Therapeutic activities completed for duration as noted below including:  Pt completed LUE table slides with towel to encourage AROM, proprioception, and functional use.   OT educated pt and spouse on use of fabricated brake extender for wheelchair to promote functional use and grasp assumption with LUE. Pt able to lock and unlock brakes with increased time. He was encouraged to continue use at home.   - subsequent orthotic fit  L resting hand splint modified to include wider thumb strap, thick (tan) foam in finger pan, cotton foam in thumb pan, and moleskin applied to distal aspect of splint to limit injury during sleep secondary to pt ataxia.   PATIENT EDUCATION: Education details: see above Person educated: Patient and Spouse Education method: Explanation, Demonstration, and Verbal cues Education comprehension: verbalized understanding, verbal cues required, and needs further  education  HOME EXERCISE PROGRAM: 07/09/24: Initial LUE HEP  07/24/24: memory compensation strategies   GOALS: Goals reviewed with patient? Yes  SHORT TERM GOALS: Target date: 08/09/24  Independent with HEP for LUE Baseline: Goal status: IN PROGRESS  2.  Independent with splint wear and care for Lt hand/wrist (resting hand splint) Baseline:  Goal status: IN PROGRESS  3.  Pt/family to verbalize understanding with hemi dressing/bathing techniques and A/E to increase independence with ADLS Baseline:  Goal status: IN PROGRESS  4.  Pt to verbalize understanding with memory compensation strategies  Baseline:  Goal status: IN PROGRESS   LONG TERM GOALS: Target date: 09/08/24  Independent with updated HEP  Baseline:  Goal status: INITIAL  2.  Pt to consistently perform dressing and bathing with no more than min assist Baseline:  Goal status: INITIAL  3.  Pt to use LUE as stabilizer at least 25% of the time for ADLS/bilateral tasks Baseline:  Goal status: INITIAL  4.  Pt to cut food with A/E (rocker knife)  Baseline:  Goal status: INITIAL  5.  Pt to id 3 pain management strategies for Lt hand I'ly Baseline:  Goal status: INITIAL   ASSESSMENT:  CLINICAL IMPRESSION: Patient is a 68 y.o. male who was seen today for occupational therapy treatment for CVA w/ Lt hemiparesis in April 2025. As pt is now considered a chronic stroke and given his ataxia, would consider prefabricated resting hand splint in addition to fabricated hand splint. Pt encouraged to include LUE more consistently in daily activities to promote improved ROM and overall level of independence.  PERFORMANCE DEFICITS: in functional skills including ADLs, IADLs, coordination, edema, tone, ROM, strength, pain, Fine motor control, Gross motor control, mobility, body mechanics, decreased knowledge of precautions, decreased knowledge of use of DME, and UE functional use, cognitive skills including memory.    IMPAIRMENTS: are limiting patient from ADLs, IADLs, rest and sleep, leisure, and social participation.   CO-MORBIDITIES: may have co-morbidities  that affects occupational performance. Patient will benefit from skilled OT to address above impairments and improve overall function.  REHAB POTENTIAL: Good  PLAN:  OT FREQUENCY: 2x/week  OT DURATION: 8 weeks  PLANNED INTERVENTIONS: 97535 self care/ADL training, 02889 therapeutic exercise, 97530 therapeutic activity, 97112 neuromuscular re-education, 97140 manual therapy, 97113 aquatic therapy, 97018 paraffin, 02960 fluidotherapy, 97010 moist heat, 97010 cryotherapy, 97760 Orthotic Initial, 97763 Orthotic/Prosthetic subsequent, passive range of motion, functional mobility training, visual/perceptual remediation/compensation, energy conservation, patient/family education, and DME and/or AE instructions  RECOMMENDED OTHER SERVICES: none at this time  CONSULTED AND AGREED WITH PLAN OF CARE: Patient and family member/caregiver  PLAN FOR NEXT SESSION:  Consider prefab if pt still having issues with fit and hitting self in head; review use of brake extender on w/c using LUE  ADLS, try UBE w/ Lt hand wrapped - however if too much compensations or painful then stop UBE  *NO estim d/t defibrillator   Jocelyn CHRISTELLA Bottom, OT 08/01/2024, 8:49 AM           "

## 2024-08-06 ENCOUNTER — Ambulatory Visit: Admitting: Occupational Therapy

## 2024-08-06 ENCOUNTER — Ambulatory Visit: Admitting: Physical Therapy

## 2024-08-06 VITALS — BP 107/58 | HR 57

## 2024-08-06 DIAGNOSIS — R7303 Prediabetes: Secondary | ICD-10-CM | POA: Diagnosis not present

## 2024-08-06 DIAGNOSIS — K219 Gastro-esophageal reflux disease without esophagitis: Secondary | ICD-10-CM | POA: Diagnosis not present

## 2024-08-06 DIAGNOSIS — R262 Difficulty in walking, not elsewhere classified: Secondary | ICD-10-CM

## 2024-08-06 DIAGNOSIS — R634 Abnormal weight loss: Secondary | ICD-10-CM | POA: Diagnosis not present

## 2024-08-06 DIAGNOSIS — E785 Hyperlipidemia, unspecified: Secondary | ICD-10-CM | POA: Diagnosis not present

## 2024-08-06 DIAGNOSIS — R1900 Intra-abdominal and pelvic swelling, mass and lump, unspecified site: Secondary | ICD-10-CM | POA: Diagnosis not present

## 2024-08-06 DIAGNOSIS — R2681 Unsteadiness on feet: Secondary | ICD-10-CM

## 2024-08-06 DIAGNOSIS — D696 Thrombocytopenia, unspecified: Secondary | ICD-10-CM | POA: Diagnosis not present

## 2024-08-06 DIAGNOSIS — I69354 Hemiplegia and hemiparesis following cerebral infarction affecting left non-dominant side: Secondary | ICD-10-CM | POA: Diagnosis not present

## 2024-08-06 DIAGNOSIS — B20 Human immunodeficiency virus [HIV] disease: Secondary | ICD-10-CM | POA: Diagnosis not present

## 2024-08-06 DIAGNOSIS — F329 Major depressive disorder, single episode, unspecified: Secondary | ICD-10-CM | POA: Diagnosis not present

## 2024-08-06 DIAGNOSIS — I5022 Chronic systolic (congestive) heart failure: Secondary | ICD-10-CM | POA: Diagnosis not present

## 2024-08-06 DIAGNOSIS — R2689 Other abnormalities of gait and mobility: Secondary | ICD-10-CM

## 2024-08-06 DIAGNOSIS — R293 Abnormal posture: Secondary | ICD-10-CM

## 2024-08-06 DIAGNOSIS — R1319 Other dysphagia: Secondary | ICD-10-CM | POA: Diagnosis not present

## 2024-08-06 DIAGNOSIS — I10 Essential (primary) hypertension: Secondary | ICD-10-CM | POA: Diagnosis not present

## 2024-08-06 DIAGNOSIS — G8194 Hemiplegia, unspecified affecting left nondominant side: Secondary | ICD-10-CM | POA: Diagnosis not present

## 2024-08-06 DIAGNOSIS — M6281 Muscle weakness (generalized): Secondary | ICD-10-CM

## 2024-08-06 NOTE — Therapy (Signed)
 " OUTPATIENT PHYSICAL THERAPY NEURO TREATMENT   Patient Name: Richard [name] MRN: [id number] DOB:[birth year], 68 y.o., male Today's Date: 08/06/2024  PCP: Ezekiel Charleston, MD REFERRING PROVIDER: Wynne Flatten, MD  END OF SESSION:  PT End of Session - 08/06/24 1102     Visit Number 18    Number of Visits 27   re-cert   Date for Recertification  98/69/73   re-cert; due to potential delay in scheduling   Authorization Type [facility]    Authorization Time Period 08/01/24-11/29/24    Authorization - Number of Visits 15    Progress Note Due on Visit 20    PT Start Time 1100    PT Stop Time 1145    PT Time Calculation (min) 45 min    Equipment Utilized During Treatment Gait belt    Activity Tolerance Patient tolerated treatment well    Behavior During Therapy WFL for tasks assessed/performed              Past Medical History:  Diagnosis Date   Coronary artery disease    a. cath 04/02/2014 3v dx 80-90% in D1, 80-90% mid LAD, 95% apical LAD, 95% distal inferior LV branch of LCx, total occlusion of prox RCA  b). Cath 04/03/2014 DES to mid LAD, DES x3 to prox D1, DAPT indefinitely. LCx lesion untreated   GERD (gastroesophageal reflux disease)    HIV (human immunodeficiency virus infection) (HCC)    Hypertension    Ischemic cardiomyopathy 08/09/2013   EF 20-25 percent by echo in 04/2014, s/p [employer] [place] model [id number] serial number [id number]   Stroke [facility])    Past Surgical History:  Procedure Laterality Date   CARDIAC CATHETERIZATION  04/02/2014   DR BURNARD   ESOPHAGOGASTRODUODENOSCOPY (EGD) WITH PROPOFOL  N/A 09/08/2018   Procedure: ESOPHAGOGASTRODUODENOSCOPY (EGD) WITH PROPOFOL ;  Surgeon: Rollin Dover, MD;  Location: [facility];  Service: Endoscopy;  Laterality: N/A;   IMPLANTABLE CARDIOVERTER DEFIBRILLATOR IMPLANT N/A 09/10/2014   Procedure: IMPLANTABLE CARDIOVERTER DEFIBRILLATOR IMPLANT;  Surgeon: Jerel Balding, MD;  [facility] Bluejacket model [id number] serial number  [id number]   IR CT HEAD LTD  12/01/2023   IR PERCUTANEOUS ART THROMBECTOMY/INFUSION INTRACRANIAL INC DIAG ANGIO  12/01/2023   IR US  GUIDE VASC ACCESS RIGHT  12/01/2023   LEFT AND RIGHT HEART CATHETERIZATION WITH CORONARY ANGIOGRAM N/A 04/02/2014   Procedure: LEFT AND RIGHT HEART CATHETERIZATION WITH CORONARY ANGIOGRAM;  Surgeon: Debby DELENA Burnard, MD;  Location[facility] CATH LAB;  Service: Cardiovascular;  Laterality: N/A;   MEDIAL COLLATERAL LIGAMENT REPAIR, KNEE Right 08/25/2015   Procedure: RIGHT THUMB RADICAL COLLATERAL LIGAMENT REPAIR;  Surgeon: Donnice Robinsons, MD;  Location: [facility] OR;  Service: Orthopedics;  Laterality: Right;   PERCUTANEOUS CORONARY STENT INTERVENTION (PCI-S) N/A 04/03/2014   Procedure: PERCUTANEOUS CORONARY STENT INTERVENTION (PCI-S);  Surgeon: Peter M Jordan, MD;  Location: [facility] CATH LAB;  Service: Cardiovascular;  Laterality: N/A;   RADIOLOGY WITH ANESTHESIA N/A 12/01/2023   Procedure: RADIOLOGY WITH ANESTHESIA;  Surgeon: Radiologist, Medication, MD;  Location: [facility] OR;  Service: Radiology;  Laterality: N/A;  BETHANY BRADFORD DILATION N/A 09/08/2018   Procedure: SAVORY DILATION;  Surgeon: Rollin Dover, MD;  Location: [facility];  Service: Endoscopy;  Laterality: N/A;   Patient Active Problem List   Diagnosis Date Noted   ICD (implantable cardioverter-defibrillator) discharge 03/23/2024   Hyperlipidemia 01/02/2024   CKD stage 3a, GFR 45-59 ml/min (HCC) 01/02/2024   Depression with anxiety 12/27/2023   Chronic systolic congestive heart failure (HCC) 12/23/2023  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   Acute ischemic right MCA stroke (HCC) 12/01/2023   Schizophrenia, unspecified (HCC) 03/07/2023   Recurrent major depression 03/07/2023   Pseudophakia of left eye 03/07/2023   Acid reflux 03/07/2023   Dyspepsia 03/07/2023   Anxiety state 03/07/2023   Cortical age-related cataract of right eye 03/07/2023   Psychotic disorder (HCC) 03/07/2023   CAD (coronary artery  disease) 03/07/2023   Abdominal mass 07/12/2021   Peripheral vascular disease 02/04/2021   Overweight 01/21/2020   Dysphagia 12/04/2018   Hiatal hernia 04/19/2018   Gastroesophageal reflux disease 04/03/2018   Ischemic congestive cardiomyopathy (HCC) 10/17/2017   Generalized anxiety disorder 10/17/2017   Prediabetes 10/16/2017   Acquired thrombocytopenia 10/16/2017   Chronic kidney disease, stage 3 (HCC) 10/16/2017   Primary erectile dysfunction 10/13/2017   Chronic combined systolic and diastolic heart failure (HCC) 12/30/2015   Traumatic rupture of collateral ligament of other finger at metacarpophalangeal and interphalangeal joint, subsequent encounter 10/30/2015   Traumatic rupture of collateral ligament of other finger at metacarpophalangeal and interphalangeal joint, initial encounter 08/12/2015   VT (ventricular tachycardia) (HCC) 12/31/2014   ICD (implantable cardioverter-defibrillator) in place 09/10/2014   At risk for sudden cardiac death 2014/08/23   Systolic heart failure (HCC) 08/09/2014   Coronary artery disease involving native coronary artery of native heart without angina pectoris 04/04/2014   Unstable angina (HCC) 04/03/2014   Cardiomyopathy, ischemic 03/22/2014   History of CVA (cerebrovascular accident) 02/20/2014   Human immunodeficiency virus infection (HCC) 02/20/2014   Essential hypertension 02/20/2014   Hyperlipidemia LDL goal <70 02/20/2014   Depression 02/20/2014   Cerebral infarction (HCC) 08/09/2006    ONSET DATE: 05/15/24 referral    REFERRING DIAG: I63.9 (ICD-10-CM) - Cerebral infarction, unspecified   THERAPY DIAG:  Hemiplegia and hemiparesis following cerebral infarction affecting left non-dominant side (HCC)  Unsteadiness on feet  Abnormal posture  Muscle weakness (generalized)  Difficulty in walking, not elsewhere classified  Other abnormalities of gait and mobility  Rationale for Evaluation and Treatment: Rehabilitation  SUBJECTIVE:                                                                                                                                                                                              SUBJECTIVE STATEMENT:  Pt denies any acute changes since last visit, no falls, no pain today. Pt reports he was tired after last visit.  Pt accompanied by: significant other, wife Veronica  PERTINENT HISTORY: CAD, GERD, HIV, HTN, ischemic cardiomyopathy, CVA  PAIN:  Are you having pain? No  PRECAUTIONS: Fall and ICD/Pacemaker; L hemi   PATIENT GOALS:  to walk and use my hand   OBJECTIVE:  Note: Objective measures were completed at Evaluation unless otherwise noted.  DIAGNOSTIC FINDINGS: 12/01/23 head CT IMPRESSION: 1. Subtle hypoattenuation involving the right caudate and right lentiform nuclei which may reflect acute infarct. 2. No acute intracranial hemorrhage. 3. Remote infarct involving the right frontal operculum, insula, and superior right temporal lobe. 4. Asymmetric density of the right M1 segment. Recommend correlation with CTA. 5. ASPECTS is 8   GAIT: Findings: Gait Characteristics: step to pattern, decreased arm swing- Left, decreased step length- Right, decreased stance time- Left, decreased stride length, decreased hip/knee flexion- Left, decreased ankle dorsiflexion- Left, circumduction- Left, Left hip hike, trunk rotated posterior- Left, trunk flexed, narrow BOS, and poor foot clearance- Left, Assistive device utilized:Hemi walker, and Level of assistance: CGA and Min A                                                                                                             TREATMENT   Self-Care Vitals:   08/06/24 1105  BP: (!) 107/58  Pulse: (!) 57   BP assessed in RUE in sitting at rest, WNL for patient.    NMR:  Gait pattern: decreased hip/knee flexion- Left, decreased ankle dorsiflexion- Left, circumduction- Left, and lateral lean- Right Distance walked: 115  ft Assistive device utilized: Quad cane large base Level of assistance: CGA Comments: with L AFO; unsafe when turning to sit  Gait pattern: decreased hip/knee flexion- Left, decreased ankle dorsiflexion- Left, circumduction- Left, and lateral lean- Right Distance walked: 115 ft Assistive device utilized: Quad cane small base Level of assistance: CGA Comments: decreased gait speed, decreased step length; pt still prefers LBQC due to stability   Provided handout on where to purchase [facility] online, encouraged Supervision to CGA with this device at home until patient's spouse feels more comfortable with him using the device around the house.   To work on functional LE strengthening exercises in order to add to his HEP: From elevated mat table: Staggered sit to stand with LLE pulled back x 10 reps At countertop with RUE support: Lateral sidestepping L/R 2 x 10 ft each direction before onset of fatigue Mini-squats x 10 reps Manual assist to maintain midline   Added to HEP, see bolded below   PATIENT EDUCATION: Education details: continue HEP, added to HEP Person educated: Patient and Spouse Education method: Explanation, Demonstration, Tactile cues, Verbal cues, and Handouts Education comprehension: verbalized understanding, returned demonstration, verbal cues required, tactile cues required, and needs further education  HOME EXERCISE PROGRAM: Access Code: [email or web address] URL: [email or web address] Date: 06/06/2024 Prepared by: Delon Pop  Exercises - Supine Bridge  - 1 x daily - 7 x weekly - 3 sets - 10 reps - Supine March with Posterior Pelvic Tilt  - 1 x daily - 7 x weekly - 3 sets - 10 reps - Mini Squat with Counter Support  - 1 x daily - 7 x weekly - 3 sets - 8-10 reps - Standing Knee Flexion with Counter Support  - 1 x  daily - 7 x weekly - 3 sets - 6-8 reps - Standing March with Counter Support  - 1 x daily - 7 x weekly - 3 sets - 8-10 reps - Staggered  Sit-to-Stand  - 1 x daily - 7 x weekly - 3 sets - 10 reps - Side Stepping with Counter Support  - 1 x daily - 7 x weekly - 3 sets - 10 reps - Mini Squat with Counter Support  - 1 x daily - 7 x weekly - 3 sets - 10 reps  GOALS: Goals reviewed with patient? Yes  SHORT TERM GOALS: Target date: 06/22/24  Pt will be independent with initial HEP for improved functional strength and balance  Baseline: to be provided  Goal status: MET  2.  Pt will improve gait speed to >/= .48m/s to demonstrate improved community ambulation  Baseline: .59m/s with HW + CGA; .79m/s with LBQC + CGA Goal status: MET  3.  Pt will improve TUG to </= 70 secs to demonstrated reduced fall risk  Baseline: 98s with HW + MinA; 79s LBQC + CGA Goal status: IN PROGRESS  4.  Pt will improve 5x STS to </= 22 sec to demo improved functional LE strength and balance   Baseline: 30.81 with CGA + R UE; 24/5s no UE + CGA Goal status: IN PRPOGRESS   LONG TERM GOALS: Target date: 07/13/24 (to match cert date)   Pt will be independent with final HEP for improved functional strength and balance  Baseline: to be provided; provided Goal status: MET  Pt will improve gait speed to >/= .44m/s to demonstrate improved community ambulation  Baseline: .48m/s with HW + CGA; .65m/s LBQC + CGA Goal status: IN PROGRESS  Pt will improve TUG to </= 50 secs to demonstrated reduced fall risk  Baseline: 98s with HW + MinA; 66s LBQC + CGA Goal status: IN PROGRESS  Pt will improve 5x STS to </= 15 sec to demo improved functional LE strength and balance   Baseline: 30.81 with CGA + R UE; 24s R UE + CGA Goal status: IN PROGRESS  NEW SHORT TERM GOALS: 08/10/24 Pt will be compliant with updated HEP for improved functional strength and balance   Baseline: to be updated  Goal status: NEW   2. Pt will improve gait speed to >/= .31m/s to demonstrate improved community ambulation  Baseline: .58m/s LBQC + CGA Goal status: NEW  3. Pt will  improve TUG to </= 50 secs to demonstrated reduced fall risk  Baseline: 66s LBQC + CGA Goal status: NEW  4. Pt will improve 5x STS to </= 20 sec to demo improved functional LE strength and balance   Baseline:  24s R UE + CGA Goal status: NEW   NEW LONG TERM GOALS: 09/07/24  Pt will be compliant with updated final HEP for improved functional strength and balance   Baseline: to be updated  Goal status: NEW  2. Pt will improve gait speed to >/= .24m/s to demonstrate improved community ambulation  Baseline: .55m/s with HW + CGA Goal status: NEW  3. Pt will improve TUG to </= 40 secs to demonstrated reduced fall risk  Baseline: 66s LBQC + CGA Goal status: NEW  4. Pt will improve 5x STS to </= 15 sec to demo improved functional LE strength and balance   Baseline: 24s R UE + CGA Goal status: NEW   ASSESSMENT:  CLINICAL IMPRESSION: Emphasis of skilled PT session on continuing to assess vitals, working on gait  training with [facility] compared to [facility], and working on LLE NMR and functional strengthening exercises to add to his HEP. Pt's BP remains low which is normal for him. He continues to exhibit improved confidence and stability with use of LBQC as compared to [facility], encouraged his spouse to purchase this device for use at home but to provide close Supervision to CGA initially until they become more comfortable with it. He continues to benefit from skilled PT services to work on LLE NMR as well as to work towards increased safety and independence with his functional mobility. Continue POC.    OBJECTIVE IMPAIRMENTS: Abnormal gait, decreased balance, decreased coordination, decreased endurance, decreased knowledge of condition, decreased knowledge of use of DME, decreased ROM, decreased strength, impaired tone, impaired UE functional use, and postural dysfunction.   ACTIVITY LIMITATIONS: carrying, lifting, bending, standing, squatting, stairs, transfers, bed mobility, locomotion level, and  caring for others  PARTICIPATION LIMITATIONS: meal prep, cleaning, interpersonal relationship, driving, shopping, community activity, occupation, and yard work  PERSONAL FACTORS: Age, Fitness, Past/current experiences, Time since onset of injury/illness/exacerbation, Transportation, and 3+ comorbidities: see above are also affecting patient's functional outcome.   REHAB POTENTIAL: Fair time since onset  CLINICAL DECISION MAKING: Stable/uncomplicated  EVALUATION COMPLEXITY: Low  PLAN:  PT FREQUENCY: 2x/week  PT DURATION: other: 7 weeks  PLANNED INTERVENTIONS: [id number]- PT Re-evaluation, 97750- Physical Performance Testing, 97110-Therapeutic exercises, 97530- Therapeutic activity, [id number]- Neuromuscular re-education, 97535- Self Care, [id number]- Manual therapy, [id number]- Gait training, [phone]- Orthotic Initial, [phone]- Orthotic/Prosthetic subsequent, [phone]- Aquatic Therapy, [phone] (1-2 muscles), [id number] (3+ muscles)- Dry Needling, Patient/Family education, Balance training, Stair training, Taping, Vestibular training, Visual/preceptual remediation/compensation, DME instructions, and Wheelchair mobility training  PLAN FOR NEXT SESSION: check STG, HIGT! Continue working with [facility], continue use of L LE, weight shifting in bars with toe taps, improve L hip flexion strength. Facilitate left weight shifting during ambulation/ literally anything to get more weight on the left leg (modified SLS, R LE step ups so he has to spend more time on L, etc); staggered sit to stands, L hip abd strengthening (resisted dot taps), did they get [facility]?    Waddell Southgate, PT Waddell Southgate, PT, DPT, CSRS   08/06/2024, 11:50 AM        "

## 2024-08-06 NOTE — Therapy (Signed)
 " OUTPATIENT OCCUPATIONAL THERAPY NEURO TREATMENT  Patient Name: Richard [name] MRN: [id number] DOB:[birth year], 68 y.o., male Today's Date: 08/06/2024  PCP: [name], MD REFERRING PROVIDER: [name], MD  END OF SESSION:  OT End of Session - 08/06/24 1000     Visit Number 8    Number of Visits 15    Date for Recertification  09/08/24    Authorization Type VA approved 15 visits from 06/07/24 - 10/05/24    OT Start Time 0845    OT Stop Time 0925    OT Time Calculation (min) 40 min    Activity Tolerance Patient tolerated treatment well    Behavior During Therapy [facility] for tasks assessed/performed          Past Medical History:  Diagnosis Date   Coronary artery disease    a. cath 04/02/2014 3v dx 80-90% in D1, 80-90% mid LAD, 95% apical LAD, 95% distal inferior LV branch of LCx, total occlusion of prox RCA  b). Cath 04/03/2014 DES to mid LAD, DES x3 to prox D1, DAPT indefinitely. LCx lesion untreated   GERD (gastroesophageal reflux disease)    HIV (human immunodeficiency virus infection) (HCC)    Hypertension    Ischemic cardiomyopathy 08/09/2013   EF 20-25 percent by echo in 04/2014, s/p [employer] [place] model [id number] serial number [id number]   Stroke [facility])    Past Surgical History:  Procedure Laterality Date   CARDIAC CATHETERIZATION  04/02/2014   DR BURNARD   ESOPHAGOGASTRODUODENOSCOPY (EGD) WITH PROPOFOL  N/A 09/08/2018   Procedure: ESOPHAGOGASTRODUODENOSCOPY (EGD) WITH PROPOFOL ;  Surgeon: Rollin Dover, MD;  Location: [facility] END[facility];  Service: Endoscopy;  Laterality: N/A;   IMPLANTABLE CARDIOVERTER DEFIBRILLATOR IMPLANT N/A 09/10/2014   Procedure: IMPLANTABLE CARDIOVERTER DEFIBRILLATOR IMPLANT;  Surgeon: Jerel Balding, MD;  [facility] model [phone] serial number [id number]   IR CT HEAD LTD  12/01/2023   IR PERCUTANEOUS ART THROMBECTOMY/INFUSION INTRACRANIAL INC DIAG ANGIO  12/01/2023   IR US  GUIDE VASC ACCESS RIGHT  12/01/2023   LEFT AND RIGHT  HEART CATHETERIZATION WITH CORONARY ANGIOGRAM N/A 04/02/2014   Procedure: LEFT AND RIGHT HEART CATHETERIZATION WITH CORONARY ANGIOGRAM;  Surgeon: Debby DELENA Burnard, MD;  Location: [facility] CATH LAB;  Service: Cardiovascular;  Laterality: N/A;   MEDIAL COLLATERAL LIGAMENT REPAIR, KNEE Right 08/25/2015   Procedure: RIGHT THUMB RADICAL COLLATERAL LIGAMENT REPAIR;  Surgeon: Donnice Robinsons, MD;  Location: [facility] OR;  Service: Orthopedics;  Laterality: Right;   PERCUTANEOUS CORONARY STENT INTERVENTION (PCI-S) N/A 04/03/2014   Procedure: PERCUTANEOUS CORONARY STENT INTERVENTION (PCI-S);  Surgeon: Peter M Jordan, MD;  Location[facility] CATH LAB;  Service: Cardiovascular;  Laterality: N/A;   RADIOLOGY WITH ANESTHESIA N/A 12/01/2023   Procedure: RADIOLOGY WITH ANESTHESIA;  Surgeon: Radiologist, Medication, MD;  Location: [facility] OR;  Service: Radiology;  Laterality: N/A;  BETHANY BRADFORD DILATION N/A 09/08/2018   Procedure: SAVORY DILATION;  Surgeon: Rollin Dover, MD;  Location: [facility];  Service: Endoscopy;  Laterality: N/A;   Patient Active Problem List   Diagnosis Date Noted   ICD (implantable cardioverter-defibrillator) discharge 03/23/2024   Hyperlipidemia 01/02/2024   CKD stage 3a, GFR 45-59 ml/min (HCC) 01/02/2024   Depression with anxiety 12/27/2023   Chronic systolic congestive heart failure (HCC) 12/23/2023   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   Acute ischemic right MCA stroke (HCC) 12/01/2023   Schizophrenia, unspecified (HCC) 03/07/2023   Recurrent major depression 03/07/2023   Pseudophakia of left eye 03/07/2023   Acid reflux 03/07/2023  Dyspepsia 03/07/2023   Anxiety state 03/07/2023   Cortical age-related cataract of right eye 03/07/2023   Psychotic disorder (HCC) 03/07/2023   CAD (coronary artery disease) 03/07/2023   Abdominal mass 07/12/2021   Peripheral vascular disease 02/04/2021   Overweight 01/21/2020   Dysphagia 12/04/2018   Hiatal hernia 04/19/2018   Gastroesophageal  reflux disease 04/03/2018   Ischemic congestive cardiomyopathy (HCC) 10/17/2017   Generalized anxiety disorder 10/17/2017   Prediabetes 10/16/2017   Acquired thrombocytopenia 10/16/2017   Chronic kidney disease, stage 3 (HCC) 10/16/2017   Primary erectile dysfunction 10/13/2017   Chronic combined systolic and diastolic heart failure (HCC) 12/30/2015   Traumatic rupture of collateral ligament of other finger at metacarpophalangeal and interphalangeal joint, subsequent encounter 10/30/2015   Traumatic rupture of collateral ligament of other finger at metacarpophalangeal and interphalangeal joint, initial encounter 08/12/2015   VT (ventricular tachycardia) (HCC) 12/31/2014   ICD (implantable cardioverter-defibrillator) in place 09/10/2014   At risk for sudden cardiac death Sep 18, 2014   Systolic heart failure (HCC) 08/09/2014   Coronary artery disease involving native coronary artery of native heart without angina pectoris 04/04/2014   Unstable angina (HCC) 04/03/2014   Cardiomyopathy, ischemic 03/22/2014   History of CVA (cerebrovascular accident) 02/20/2014   Human immunodeficiency virus infection (HCC) 02/20/2014   Essential hypertension 02/20/2014   Hyperlipidemia LDL goal <70 02/20/2014   Depression 02/20/2014   Cerebral infarction (HCC) 08/09/2006    ONSET DATE: 06/19/2024  REFERRING DIAG: I63.9 (ICD-10-CM) - Cerebral infarction, unspecified  Note:  [place] Auth# [id number] 06/07/24 - 10/05/24 15 OT visits  THERAPY DIAG:  Hemiplegia and hemiparesis following cerebral infarction affecting left non-dominant side (HCC)  Rationale for Evaluation and Treatment: Rehabilitation  SUBJECTIVE:   SUBJECTIVE STATEMENT: Wife/pt reports he slid out of splint the last few nights  Pt accompanied by: significant other (wife)   PERTINENT HISTORY: presented 12/01/23 with L-sided weakness and slurred speech. CTA showed occlusion of the right M1 segment, proximal M2 branch of the right MCA  with intraluminal thrombus. S/p mechanical thrombectomy 4/24. CT showed subtle hypoattenuation involving the right caudate and right lentiform nuclei which may reflect acute infarct. MRI pending. PMH: CAD s/p CABG x3 2015, ischemic cardiomyopathy s/p ICD placement, HTN, HLD, HIV, GERD, CVA in 2008 without residual deficits  PRECAUTIONS: Fall and ICD/Pacemaker, no driving  WEIGHT BEARING RESTRICTIONS: No  PAIN:  Are you having pain? Lt hand fluctuates 5/10  FALLS: Has patient fallen in last 6 months? Yes. Number of falls 4; slid off sofa, fell off bed, nearly fell off commode into tub    LIVING ENVIRONMENT: Lives with: lives with their spouse Lives in: one level home Stairs: Yes: External: 1 steps; on right going up Has following equipment at home: Hemi walker, Wheelchair (manual), Shower bench, bed side commode, and Grab bars   PLOF: Independent  PATIENT GOALS: get out of this wheelchair  OBJECTIVE:  Note: Objective measures were completed at Evaluation unless otherwise noted.  HAND DOMINANCE: Right  ADLs:  Eating: assist to cut food Grooming: set up for brushing gums and implants, wife brushes dentures and shaves him (wife always shaved pt even prior to CVA) UB Dressing: mod to max assist LB Dressing: max assist Toileting: mod I  Bathing: mod assist - mostly seated Tub Shower transfers: min assist for leg management on Lt  Equipment: Transfer tub bench, Grab bars, and hand held shower  IADLs: dependent for all IADLS Handwriting: No changes  MOBILITY STATUS: uses w/c mostly, uses hemi walker some at home  UPPER EXTREMITY ROM:  RUE AROM WNL's  LUE AROM dominated by synergy pattern (goes into some sh abduction, elbow flex, pronation but no isolated movement) No active movement in hand Pt can only tolerate approx 75% passive shoulder flexion   UPPER EXTREMITY MMT:   NT d/t spasticity   HAND FUNCTION: No functional use LT hand  COORDINATION: No functional use Lt  hand  SENSATION: WFL  EDEMA: mild Lt hand  MUSCLE TONE: LUE: Moderate, Hypertonic, and Modifed Ashworth Scale 3 = Considerable increase in muscle tone, passive movement difficult at elbow, 2/4 wrist and fingers  COGNITION: Overall cognitive status: Impaired, decreased memory Wife reports he sometimes doesn't turn the sink off after washing hands  VISION: Subjective report: about the same to me. Got new glasses Baseline vision: Wears glasses all the time but not wearing at evaluation Visual history: cataract sx OU  VISION ASSESSMENT: Not tested    PERCEPTION: Not tested  PRAXIS: Not tested  OBSERVATIONS: dysarthria of speech, reports getting choked especially on water and saliva                                                                                                                           TREATMENT:  Adjusted resting hand splint by changing wrist and finger straps to firmer straps with padding to decrease potential to slide out of splint. Pt also had reddened area at forearm radial side, therefore added moleskin padding to radial side of splint. Overall, much better fit, however cautioned pt/wife to watch for any pooling edema between straps - if so, may need to take padding off wrist strap and add neoprene padding instead. Will monitor closely Set up for UBE w/ min assist to transfer to seat and then therapist assisted in using wrap for Lt hand. Pt tolerated UBE x 5 min, level 1 with hand wrapped, pillow behind back, and going backwards majority of time   PATIENT EDUCATION: Education details: see above Person educated: Patient and Spouse Education method: Explanation, Demonstration, and Verbal cues Education comprehension: verbalized understanding, verbal cues required, and needs further education  HOME EXERCISE PROGRAM: 07/09/24: Initial LUE HEP  07/24/24: memory compensation strategies   GOALS: Goals reviewed with patient? Yes  SHORT TERM GOALS: Target date:  08/09/24  Independent with HEP for LUE Baseline: Goal status: IN PROGRESS  2.  Independent with splint wear and care for Lt hand/wrist (resting hand splint) Baseline:  Goal status: IN PROGRESS  3.  Pt/family to verbalize understanding with hemi dressing/bathing techniques and A/E to increase independence with ADLS Baseline:  Goal status: IN PROGRESS  4.  Pt to verbalize understanding with memory compensation strategies  Baseline:  Goal status: IN PROGRESS   LONG TERM GOALS: Target date: 09/08/24  Independent with updated HEP  Baseline:  Goal status: INITIAL  2.  Pt to consistently perform dressing and bathing with no more than min assist Baseline:  Goal status: INITIAL  3.  Pt to use LUE as  stabilizer at least 25% of the time for ADLS/bilateral tasks Baseline:  Goal status: INITIAL  4.  Pt to cut food with A/E (rocker knife)  Baseline:  Goal status: INITIAL  5.  Pt to id 3 pain management strategies for Lt hand I'ly Baseline:  Goal status: INITIAL   ASSESSMENT:  CLINICAL IMPRESSION: Patient is a 68 y.o. male who was seen today for occupational therapy treatment for CVA w/ Lt hemiparesis in April 2025. As pt is now considered a chronic stroke and given his ataxia, would consider prefabricated resting hand splint in addition to fabricated hand splint. Pt encouraged to include LUE more consistently in daily activities to promote improved ROM and overall level of independence.  PERFORMANCE DEFICITS: in functional skills including ADLs, IADLs, coordination, edema, tone, ROM, strength, pain, Fine motor control, Gross motor control, mobility, body mechanics, decreased knowledge of precautions, decreased knowledge of use of DME, and UE functional use, cognitive skills including memory.   IMPAIRMENTS: are limiting patient from ADLs, IADLs, rest and sleep, leisure, and social participation.   CO-MORBIDITIES: may have co-morbidities  that affects occupational performance. Patient  will benefit from skilled OT to address above impairments and improve overall function.  REHAB POTENTIAL: Good  PLAN:  OT FREQUENCY: 2x/week  OT DURATION: 8 weeks  PLANNED INTERVENTIONS: 97535 self care/ADL training, 02889 therapeutic exercise, 97530 therapeutic activity, 97112 neuromuscular re-education, 97140 manual therapy, 97113 aquatic therapy, 97018 paraffin, 02960 fluidotherapy, 97010 moist heat, 97010 cryotherapy, 97760 Orthotic Initial, 97763 Orthotic/Prosthetic subsequent, passive range of motion, functional mobility training, visual/perceptual remediation/compensation, energy conservation, patient/family education, and DME and/or AE instructions  RECOMMENDED OTHER SERVICES: none at this time  CONSULTED AND AGREED WITH PLAN OF CARE: Patient and family member/caregiver  PLAN FOR NEXT SESSION:  Consider prefab if pt still having issues with fit and hitting self in head;  ADLS, NMR, UBE  *NO estim d/t defibrillator   Burnard JINNY Roads, OT 08/06/2024, 10:00 AM           "

## 2024-08-10 ENCOUNTER — Encounter: Payer: Medicare (Managed Care) | Attending: Registered Nurse | Admitting: Physical Medicine & Rehabilitation

## 2024-08-10 ENCOUNTER — Encounter: Payer: Self-pay | Admitting: Physical Medicine & Rehabilitation

## 2024-08-10 VITALS — BP 110/70 | HR 56

## 2024-08-10 DIAGNOSIS — G8110 Spastic hemiplegia affecting unspecified side: Secondary | ICD-10-CM | POA: Insufficient documentation

## 2024-08-10 DIAGNOSIS — G8112 Spastic hemiplegia affecting left dominant side: Secondary | ICD-10-CM

## 2024-08-10 NOTE — Patient Instructions (Signed)
" °  VISIT SUMMARY: You visited us  today to discuss the persistent spasticity in your left arm and leg following your stroke. We talked about the tightness and difficulty you're experiencing, especially in your left arm, and the potential benefits of Botox injections to help manage these symptoms.  YOUR PLAN: SPASTICITY AND FUNCTIONAL DEFICITS: You have ongoing spasticity in your left arm and leg due to your previous stroke, which is affecting your movement and strength. -We recommend Botox injections for your left arm, specifically targeting the elbow, wrist, and finger flexors. -You will need 8-12 injections into different muscle groups. -We have started the process to schedule your Botox injections and will check with your insurance for authorization.  PAIN MANAGEMENT FOR POST-STROKE SPASTICITY: You are experiencing discomfort and tightness in your left arm and leg due to increased muscle tone. -We discussed that you might experience mild soreness and bruising after the Botox injections. -The effects of Botox should start in about one week and last for approximately three months.                      Contains text generated by [employer].                                 Contains text generated by [employer].   "

## 2024-08-10 NOTE — Progress Notes (Signed)
 "  Subjective:    Patient ID: Richard [name], male    DOB: 1955/10/28, 69 y.o.   MRN: [id number] 69 y.o. right-handed male with history significant for HIV maintained on Dovato , CKD stage III with baseline creatinine 1.3-1.5, CAD/MI/CABG 2015 followed by Dr. Jordan, severe ischemic cardiomyopathy, compensated chronic systolic and diastolic congestive heart failure status post ICD, history of NSVT, hyperlipidemia hypertension.  Per chart review lives with spouse independent prior to admission 1 level home 2 steps to entry.  Presented 12/01/2023 with left-sided weakness and dysarthria of unclear duration.  CT of the head showed subtle hypoattenuation involving the right caudate and right lentiform nuclei.  No acute intracranial hemorrhage.  Remote infarct involving the right frontal operculum insula and superior right temporal lobe.  CTA of the head and neck showed occlusion of the right M1 segment as well as proximal M2 branch of the right MCA and intraluminal thrombus noted.  Reconstitution of multiple M2 branches of the right MCA.  Several M2 inferior division branches demonstrated diminished intraluminal contrast.  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  Reconstitution of the supraclinoid left ICA as well as left ACA and left MCA via the circle of Willis.  Admission chemistries unremarkable except creatinine 1.33 alcohol negative SARS coronavirus negative.  Patient underwent revascularization of right MCA occlusion per interventional radiology.  MRI follow-up revealed moderate-sized acute infarct in the right MCA distribution.  Echocardiogram with ejection fraction of 20 to 25%.  Left ventricle demonstrating global hypokinesis.  Grade 1 diastolic dysfunction.  Neurology follow-up initially maintained on low-dose aspirin  81 mg daily and Plavix  75 mg daily for CVA prophylaxis and transition to Eliquis  continuing aspirin  with Plavix   discontinued.    Admit date: 12/06/2023 Discharge date: 01/05/2024 HPI Discussed the use of AI scribe software for clinical note transcription with the patient, who gave verbal consent to proceed.  History of Present Illness Richard [name] is a 69 year old male with left-sided monoplegia following cerebral infarction who presents for evaluation of persistent spasticity in the left arm and left leg.  He reports that his left arm and left leg are bothering him, with more issues in the left arm. He reports tightness in his left arm and difficulty moving his hand. He reports that his left leg, especially his left knee, is also affected.  He has recently participated in physical therapy; however, therapy notes did not specifically address muscle tone in the affected limbs. He has been informed about the potential use of botulinum toxin injections for spasticity management, with emphasis on the left arm.    Pain Inventory Average Pain 3 Pain Right Now 0 My pain is aching  In the last 24 hours, has pain interfered with the following? General activity 0 Relation with others 0 Enjoyment of life 0 What TIME of day is your pain at its worst? evening Sleep (in general) Fair  Pain is worse with: walking Pain improves with: heat/ice and medication Relief from Meds: .  Family History  Problem Relation Age of Onset   Heart disease Mother    Esophageal cancer Neg Hx    Colon cancer Neg Hx    Social History   Socioeconomic History   Marital status: Married    Spouse name: Not on file   Number of children: 1   Years of education: Not on file   Highest education level: Not on file  Occupational History  Occupation: disable Vet  Tobacco Use   Smoking status: Never   Smokeless tobacco: Never  Vaping Use   Vaping status: Never Used  Substance and Sexual Activity   Alcohol use: No   Drug use: No   Sexual activity: Not Currently  Other Topics Concern   Not on file  Social  History Narrative   He lives with his wife.  He was in the [employer] and gets his meds from the [place].  He has one child and two grands.    Social Drivers of Health   Tobacco Use: Low Risk (07/30/2024)   Patient History    Smoking Tobacco Use: Never    Smokeless Tobacco Use: Never    Passive Exposure: Not on file  Recent Concern: Tobacco Use - Medium Risk (06/20/2024)   Received from [facility]   Patient History    Smoking Tobacco Use: Former    Smokeless Tobacco Use: Never    Passive Exposure: Not on file  Financial Resource Strain: Not on file  Food Insecurity: No Food Insecurity (03/24/2024)   Epic    Worried About Programme Researcher, Broadcasting/film/video in the Last Year: Never true    Ran Out of Food in the Last Year: Never true  Transportation Needs: No Transportation Needs (03/24/2024)   Epic    Lack of Transportation (Medical): No    Lack of Transportation (Non-Medical): No  Physical Activity: Not on file  Stress: Not on file  Social Connections: Socially Isolated (03/24/2024)   Social Connection and Isolation Panel    Frequency of Communication with Friends and Family: Never    Frequency of Social Gatherings with Friends and Family: Never    Attends Religious Services: Never    Database Administrator or Organizations: No    Attends Banker Meetings: Never    Marital Status: Married  Depression (PHQ2-9): Medium Risk (07/02/2024)   Depression (PHQ2-9)    PHQ-2 Score: 5  Alcohol Screen: Not on file  Housing: Unknown (05/17/2024)   Received from [facility]   Epic    Unable to Pay for Housing in the Last Year: Not on file    Number of Times Moved in the Last Year: Not on file    At any time in the past 12 months, were you homeless or living in a shelter (including now)?: No  Utilities: Not At Risk (03/24/2024)   Epic    Threatened with loss of utilities: No  Health Literacy: Not on file   Past Surgical History:  Procedure Laterality Date    CARDIAC CATHETERIZATION  04/02/2014   DR BURNARD   ESOPHAGOGASTRODUODENOSCOPY (EGD) WITH PROPOFOL  N/A 09/08/2018   Procedure: ESOPHAGOGASTRODUODENOSCOPY (EGD) WITH PROPOFOL ;  Surgeon: Rollin Dover, MD;  Location: [facility];  Service: Endoscopy;  Laterality: N/A;   IMPLANTABLE CARDIOVERTER DEFIBRILLATOR IMPLANT N/A 09/10/2014   Procedure: IMPLANTABLE CARDIOVERTER DEFIBRILLATOR IMPLANT;  Surgeon: Jerel Balding, MD;  [facility] [place] model [id number] serial number [id number]   IR CT HEAD LTD  12/01/2023   IR PERCUTANEOUS ART THROMBECTOMY/INFUSION INTRACRANIAL INC DIAG ANGIO  12/01/2023   IR US  GUIDE VASC ACCESS RIGHT  12/01/2023   LEFT AND RIGHT HEART CATHETERIZATION WITH CORONARY ANGIOGRAM N/A 04/02/2014   Procedure: LEFT AND RIGHT HEART CATHETERIZATION WITH CORONARY ANGIOGRAM;  Surgeon: Debby DELENA Burnard, MD;  Location: [facility] CATH LAB;  Service: Cardiovascular;  Laterality: N/A;   MEDIAL COLLATERAL LIGAMENT REPAIR, KNEE Right 08/25/2015   Procedure: RIGHT THUMB RADICAL COLLATERAL LIGAMENT  REPAIR;  Surgeon: Donnice Robinsons, MD;  Location: [facility] OR;  Service: Orthopedics;  Laterality: Right;   PERCUTANEOUS CORONARY STENT INTERVENTION (PCI-S) N/A 04/03/2014   Procedure: PERCUTANEOUS CORONARY STENT INTERVENTION (PCI-S);  Surgeon: Peter M Jordan, MD;  Location: [facility] CATH LAB;  Service: Cardiovascular;  Laterality: N/A;   RADIOLOGY WITH ANESTHESIA N/A 12/01/2023   Procedure: RADIOLOGY WITH ANESTHESIA;  Surgeon: Radiologist, Medication, MD;  Location: [facility] OR;  Service: Radiology;  Laterality: N/A;  BETHANY BRADFORD DILATION N/A 09/08/2018   Procedure: SAVORY DILATION;  Surgeon: Rollin Dover, MD;  Location: [facility];  Service: Endoscopy;  Laterality: N/A;   Past Surgical History:  Procedure Laterality Date   CARDIAC CATHETERIZATION  04/02/2014   DR BURNARD   ESOPHAGOGASTRODUODENOSCOPY (EGD) WITH PROPOFOL  N/A 09/08/2018   Procedure: ESOPHAGOGASTRODUODENOSCOPY (EGD) WITH PROPOFOL ;  Surgeon: Rollin Dover, MD;   Location: [facility];  Service: Endoscopy;  Laterality: N/A;   IMPLANTABLE CARDIOVERTER DEFIBRILLATOR IMPLANT N/A 09/10/2014   Procedure: IMPLANTABLE CARDIOVERTER DEFIBRILLATOR IMPLANT;  Surgeon: Jerel Balding, MD;  [facility] [place] model [id number] serial number [id number]   IR CT HEAD LTD  12/01/2023   IR PERCUTANEOUS ART THROMBECTOMY/INFUSION INTRACRANIAL INC DIAG ANGIO  12/01/2023   IR US  GUIDE VASC ACCESS RIGHT  12/01/2023   LEFT AND RIGHT HEART CATHETERIZATION WITH CORONARY ANGIOGRAM N/A 04/02/2014   Procedure: LEFT AND RIGHT HEART CATHETERIZATION WITH CORONARY ANGIOGRAM;  Surgeon: Debby DELENA Burnard, MD;  Location[facility] CATH LAB;  Service: Cardiovascular;  Laterality: N/A;   MEDIAL COLLATERAL LIGAMENT REPAIR, KNEE Right 08/25/2015   Procedure: RIGHT THUMB RADICAL COLLATERAL LIGAMENT REPAIR;  Surgeon: Donnice Robinsons, MD;  Location: [facility] OR;  Service: Orthopedics;  Laterality: Right;   PERCUTANEOUS CORONARY STENT INTERVENTION (PCI-S) N/A 04/03/2014   Procedure: PERCUTANEOUS CORONARY STENT INTERVENTION (PCI-S);  Surgeon: Peter M Jordan, MD;  Location: [facility] CATH LAB;  Service: Cardiovascular;  Laterality: N/A;   RADIOLOGY WITH ANESTHESIA N/A 12/01/2023   Procedure: RADIOLOGY WITH ANESTHESIA;  Surgeon: Radiologist, Medication, MD;  Location: [facility] OR;  Service: Radiology;  Laterality: N/A;  BETHANY BRADFORD DILATION N/A 09/08/2018   Procedure: SAVORY DILATION;  Surgeon: Rollin Dover, MD;  Location: [facility];  Service: Endoscopy;  Laterality: N/A;   Past Medical History:  Diagnosis Date   Coronary artery disease    a. cath 04/02/2014 3v dx 80-90% in D1, 80-90% mid LAD, 95% apical LAD, 95% distal inferior LV branch of LCx, total occlusion of prox RCA  b). Cath 04/03/2014 DES to mid LAD, DES x3 to prox D1, DAPT indefinitely. LCx lesion untreated   GERD (gastroesophageal reflux disease)    HIV (human immunodeficiency virus infection) (HCC)    Hypertension    Ischemic cardiomyopathy 08/09/2013   EF  20-25 percent by echo in 04/2014, s/p [employer] model [id number] serial number [id number]   Stroke (HCC)    BP 110/70   Pulse (!) 56   SpO2 96%   Opioid Risk Score:   Fall Risk Score:  `1  Depression screen [facility] 2/9     07/02/2024   10:58 AM 07/03/2014   10:24 AM 06/10/2014    9:39 AM  Depression screen PHQ 2/9  Decreased Interest 1 0 0  Down, Depressed, Hopeless 0 0 1  PHQ - 2 Score 1 0 1  Altered sleeping 0    Tired, decreased energy 1    Change in appetite 1    Feeling bad or failure about yourself  0    Trouble concentrating 1  Moving slowly or fidgety/restless 1    Suicidal thoughts 0    PHQ-9 Score 5    Difficult doing work/chores Somewhat difficult        Review of Systems  Musculoskeletal:        Left shoulder pain  All other systems reviewed and are negative.      Objective:   Physical Exam Motor strength is 3 - at the left deltoid and bicep to minus at the extensors of the elbow, trace finger flexors 0 finger extensors 0 wrist extensors Tone MAS 3 at the elbow flexors MAS 3 at the wrist flexors with clonus MAS 2/3 at the finger flexors       Assessment & Plan:  Assessment and Plan Assessment & Plan Spasticity and functional deficits secondary to right MCA territory infarction Persistent spasticity in left arm and leg, impairing movement and strength. Botox indicated for targeted muscle groups to reduce spasticity and improve function. - Recommended Botox injections for left arm elbow, wrist, and finger flexors. - Explained need for 8-12 injections into different muscle groups. - Initiated scheduling for Botox injections. - Planned to check insurance authorization for Botox therapy.  Pain management for post-stroke spasticity Ongoing discomfort and tightness in left arm and leg due to increased muscle tone. - Provided education on mild soreness and bruising post-Botox. - Reviewed expected onset (one week) and duration (three months)  of Botox efficacy.    Botox 300U Biceps 75U Brachialis 75U FCR 50 U FDS 50 U FDP 50U "

## 2024-08-13 ENCOUNTER — Ambulatory Visit: Admitting: Occupational Therapy

## 2024-08-13 ENCOUNTER — Ambulatory Visit: Attending: Internal Medicine

## 2024-08-13 ENCOUNTER — Ambulatory Visit: Admitting: Physical Therapy

## 2024-08-13 VITALS — BP 103/58 | HR 52

## 2024-08-13 DIAGNOSIS — R471 Dysarthria and anarthria: Secondary | ICD-10-CM | POA: Insufficient documentation

## 2024-08-13 DIAGNOSIS — I69354 Hemiplegia and hemiparesis following cerebral infarction affecting left non-dominant side: Secondary | ICD-10-CM | POA: Insufficient documentation

## 2024-08-13 DIAGNOSIS — R2689 Other abnormalities of gait and mobility: Secondary | ICD-10-CM

## 2024-08-13 DIAGNOSIS — R41842 Visuospatial deficit: Secondary | ICD-10-CM | POA: Insufficient documentation

## 2024-08-13 DIAGNOSIS — R2681 Unsteadiness on feet: Secondary | ICD-10-CM | POA: Insufficient documentation

## 2024-08-13 DIAGNOSIS — R41840 Attention and concentration deficit: Secondary | ICD-10-CM | POA: Insufficient documentation

## 2024-08-13 DIAGNOSIS — R1312 Dysphagia, oropharyngeal phase: Secondary | ICD-10-CM | POA: Insufficient documentation

## 2024-08-13 DIAGNOSIS — R262 Difficulty in walking, not elsewhere classified: Secondary | ICD-10-CM

## 2024-08-13 DIAGNOSIS — R278 Other lack of coordination: Secondary | ICD-10-CM | POA: Insufficient documentation

## 2024-08-13 DIAGNOSIS — R293 Abnormal posture: Secondary | ICD-10-CM

## 2024-08-13 DIAGNOSIS — M6281 Muscle weakness (generalized): Secondary | ICD-10-CM | POA: Insufficient documentation

## 2024-08-13 DIAGNOSIS — R41841 Cognitive communication deficit: Secondary | ICD-10-CM | POA: Insufficient documentation

## 2024-08-13 DIAGNOSIS — R29818 Other symptoms and signs involving the nervous system: Secondary | ICD-10-CM | POA: Insufficient documentation

## 2024-08-13 DIAGNOSIS — M79642 Pain in left hand: Secondary | ICD-10-CM | POA: Insufficient documentation

## 2024-08-13 DIAGNOSIS — R1310 Dysphagia, unspecified: Secondary | ICD-10-CM | POA: Insufficient documentation

## 2024-08-13 NOTE — Therapy (Signed)
 " OUTPATIENT SPEECH LANGUAGE PATHOLOGY TREATMENT   Patient Name: Richard [name] MRN: [id number] DOB:[birth year], 69 y.o., male Today's Date: 08/13/2024  PCP: [name], MD REFERRING PROVIDER: [name], MD  END OF SESSION:  End of Session - 08/13/24 0933     Visit Number 5    Number of Visits 25    Date for Recertification  10/03/24    SLP Start Time 0848    SLP Stop Time  0931    SLP Time Calculation (min) 43 min    Activity Tolerance Patient tolerated treatment well             Past Medical History:  Diagnosis Date   Coronary artery disease    a. cath 04/02/2014 3v dx 80-90% in D1, 80-90% mid LAD, 95% apical LAD, 95% distal inferior LV branch of LCx, total occlusion of prox RCA  b). Cath 04/03/2014 DES to mid LAD, DES x3 to prox D1, DAPT indefinitely. LCx lesion untreated   GERD (gastroesophageal reflux disease)    HIV (human immunodeficiency virus infection) (HCC)    Hypertension    Ischemic cardiomyopathy 08/09/2013   EF 20-25 percent by echo in 04/2014, s/p [employer] [place] model [id number] serial number [id number]   Stroke [facility])    Past Surgical History:  Procedure Laterality Date   CARDIAC CATHETERIZATION  04/02/2014   DR BURNARD   ESOPHAGOGASTRODUODENOSCOPY (EGD) WITH PROPOFOL  N/A 09/08/2018   Procedure: ESOPHAGOGASTRODUODENOSCOPY (EGD) WITH PROPOFOL ;  Surgeon: Rollin Dover, MD;  Location: [facility] ENDOSCOPY;  Service: Endoscopy;  Laterality: N/A;   IMPLANTABLE CARDIOVERTER DEFIBRILLATOR IMPLANT N/A 09/10/2014   Procedure: IMPLANTABLE CARDIOVERTER DEFIBRILLATOR IMPLANT;  Surgeon: Jerel Balding, MD;  [facility] [place] model [id number] serial number [id number]   IR CT HEAD LTD  12/01/2023   IR PERCUTANEOUS ART THROMBECTOMY/INFUSION INTRACRANIAL INC DIAG ANGIO  12/01/2023   IR US  GUIDE VASC ACCESS RIGHT  12/01/2023   LEFT AND RIGHT HEART CATHETERIZATION WITH CORONARY ANGIOGRAM N/A 04/02/2014   Procedure: LEFT AND RIGHT HEART CATHETERIZATION WITH CORONARY  ANGIOGRAM;  Surgeon: Debby DELENA Burnard, MD;  Location: [facility] CATH LAB;  Service: Cardiovascular;  Laterality: N/A;   MEDIAL COLLATERAL LIGAMENT REPAIR, KNEE Right 08/25/2015   Procedure: RIGHT THUMB RADICAL COLLATERAL LIGAMENT REPAIR;  Surgeon: Donnice Robinsons, MD;  Location: [facility] OR;  Service: Orthopedics;  Laterality: Right;   PERCUTANEOUS CORONARY STENT INTERVENTION (PCI-S) N/A 04/03/2014   Procedure: PERCUTANEOUS CORONARY STENT INTERVENTION (PCI-S);  Surgeon: Peter M Jordan, MD;  Location: [facility] CATH LAB;  Service: Cardiovascular;  Laterality: N/A;   RADIOLOGY WITH ANESTHESIA N/A 12/01/2023   Procedure: RADIOLOGY WITH ANESTHESIA;  Surgeon: Radiologist, Medication, MD;  Location: [facility] OR;  Service: Radiology;  Laterality: N/A;  BETHANY BRADFORD DILATION N/A 09/08/2018   Procedure: SAVORY DILATION;  Surgeon: Rollin Dover, MD;  Location: [facility] ENDOSCOPY;  Service: Endoscopy;  Laterality: N/A;   Patient Active Problem List   Diagnosis Date Noted   ICD (implantable cardioverter-defibrillator) discharge 03/23/2024   Hyperlipidemia 01/02/2024   CKD stage 3a, GFR 45-59 ml/min (HCC) 01/02/2024   Depression with anxiety 12/27/2023   Chronic systolic congestive heart failure (HCC) 12/23/2023   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   Acute ischemic right MCA stroke (HCC) 12/01/2023   Schizophrenia, unspecified (HCC) 03/07/2023   Recurrent major depression 03/07/2023   Pseudophakia of left eye 03/07/2023   Acid reflux 03/07/2023   Dyspepsia 03/07/2023   Anxiety state 03/07/2023   Cortical age-related cataract of right eye 03/07/2023  Psychotic disorder (HCC) 03/07/2023   CAD (coronary artery disease) 03/07/2023   Abdominal mass 07/12/2021   Peripheral vascular disease 02/04/2021   Overweight 01/21/2020   Dysphagia 12/04/2018   Hiatal hernia 04/19/2018   Gastroesophageal reflux disease 04/03/2018   Ischemic congestive cardiomyopathy (HCC) 10/17/2017   Generalized anxiety disorder 10/17/2017    Prediabetes 10/16/2017   Acquired thrombocytopenia 10/16/2017   Chronic kidney disease, stage 3 (HCC) 10/16/2017   Primary erectile dysfunction 10/13/2017   Chronic combined systolic and diastolic heart failure (HCC) 12/30/2015   Traumatic rupture of collateral ligament of other finger at metacarpophalangeal and interphalangeal joint, subsequent encounter 10/30/2015   Traumatic rupture of collateral ligament of other finger at metacarpophalangeal and interphalangeal joint, initial encounter 08/12/2015   VT (ventricular tachycardia) (HCC) 12/31/2014   ICD (implantable cardioverter-defibrillator) in place 09/10/2014   At risk for sudden cardiac death 09/17/2014   Systolic heart failure (HCC) 08/09/2014   Coronary artery disease involving native coronary artery of native heart without angina pectoris 04/04/2014   Unstable angina (HCC) 04/03/2014   Cardiomyopathy, ischemic 03/22/2014   History of CVA (cerebrovascular accident) 02/20/2014   Human immunodeficiency virus infection (HCC) 02/20/2014   Essential hypertension 02/20/2014   Hyperlipidemia LDL goal <70 02/20/2014   Depression 02/20/2014   Cerebral infarction (HCC) 08/09/2006    ONSET DATE: 11/2023  REFERRING DIAG: I63.9 (ICD-10-CM) - Cerebral infarction, unspecified  THERAPY DIAG:  Dysarthria and anarthria  Dysphagia, unspecified type  Cognitive communication deficit  Rationale for Evaluation and Treatment: Rehabilitation  SUBJECTIVE:   SUBJECT.I got choked this morning Pt accompanied by: self and significant other  PERTINENT HISTORY: Pt is a 69 y.o. male who has a recent hx of CVA back in April, 2025. Pt noted residual deficits include dysarthria, cognitive-communication deficits and dysphagia. Pt's wife was present and provided additional hx. Pt's wife notices that pt's speech is low in volume during conversations. Pt recently received HH ST, primarily targeting dysarthria at sentence and conversation levels. Pt also  received ST in CIR 11/2023-12/2023, targeting swallowing, dysarthria, and cognition. Pt currently consumes regular/thin-liquid diet. Pt endorses coughing and choking on secretions on occasion. Pt's wife handles household responsibilities (finances, medication management, etc.). Pt's wife states that pt tends to lose focus during longer tasks. Pt enjoys watching sports at home. PMHx: CAD, GERD, HIV, HTN, ischemic cardiomyopathy, CVA.  PAIN:  Are you having pain? No  FALLS: Has patient fallen in last 6 months?  See PT evaluation for details  LIVING ENVIRONMENT: Lives with: lives with their family and lives with their spouse Lives in: House/apartment  PLOF:  Level of assistance: Comment: Independent with driving and medication management; wife still did finances  Employment: On disability  PATIENT GOALS: To improve thinking skills, swallowing, and speech.  OBJECTIVE:  Note: Objective measures were completed at Evaluation unless otherwise noted.  DIAGNOSTIC FINDINGS: MBSS from 12/08/23 Patient presents with mild oropharyngeal dysphagia. Oral phase is characterized by reduced lingual movement and labial seal resulting in anterior and posterior spillage. Patient independently utilized multiple swallows and digital sweep when necessary to clear oral residuals. Pharyngeal phase is remarkable for reduced epiglottic inversion and pharyngeal stripping wave resulting in mild pharyngeal residuals, mainly present in the vallecula and pyriform sinuses. Penetration present x2 with thin liquids (x1 via straw, x1 during liquid wash), though likely due to premature spillage and mistiming of epiglottic inversion and laryngeal vestibular closure. Pill consumed in puree, requiring additional bites of PO due to suspected esophageal retention. Recommend continuation of D2/thin liquid diet  with small, single sips of liquids. Patient should continue to utilize digital sweep to clear buccal pocketing of solids. Recommend  medications administered whole in puree with additional bites to promote esophogeal clearance.     COGNITION: Overall cognitive status: Impaired Areas of impairment:  Attention: Impaired: Selective Memory: Impaired: Immediate Working Short term Psychologist, Educational function: Impaired: Impulse control, Organization, Planning, and Error awareness Behavior: Impulsive Functional deficits: Medication management, time management.     MOTOR SPEECH: Overall motor speech: impaired Level of impairment: Conversation Respiration: thoracic breathing and diaphragmatic/abdominal breathing Phonation: normal and low vocal intensity Resonance: WFL Articulation: Impaired: conversation Intelligibility: Intelligibility reduced Motor planning: Appears intact Motor speech errors: aware and consistent Interfering components: N/A Effective technique: increased vocal intensity and pacing  ORAL MOTOR EXAMINATION: Overall status: Impaired:   Lingual: Left (Symmetry and Strength) Comments: N/A   STANDARDIZED ASSESSMENTS: SLUMS: 20/30  PATIENT REPORTED OUTCOME MEASURES (PROM): EAT-10: 20/36 and Communication Effectiveness Survey: 11/24                                                                                                                            TREATMENT DATE:  08/14/23: Dysphagia: Pt has been practicing HEP regularly at home. Today he completed pharyngeal strengthening exercises for optimizing pt's swallow safety and efficiency. Effortful swallows and Masako were completed with 100% consistency given rare min A. Pt completed effortful swallows 20x and Masako 10x. Pt notes that his throat feels more irritated after performing pharyngeal strengthening exercises. Pt's wife states that their doctor wanted to inform SLP about getting an Endoscopy exam performed for checking throat health/swallowing. SLP educated family about importance of objective instrumental testing for examining throat health and  swallow function. Plan is to continue pharyngeal swallow exercises and SLP to f/u with pt's PCP about pursuing an objective examination of swallowing, specifically FEES.   Dysarthria: Pt enters with some carryover of dysarthria strategies (over-articulation and increased volume). Pt engaged in structured description task, where he was required to use dysarthria strategies for responses. Pt utilized dysarthria strategies suring this task in 90% of opportunities given occasional min A. SLP engaged pt in structured conversation, while utilizing dual-task training for challenging pt's selective attention to using dysarthria strategies. Pt used dysarthria strategies with 85% consistency during structured conversation given occasional min A. Pt displayed adequate use of dysarthria strategies during dual-task challenge. Pt is making progress with speech goals. Plan is to continue structured conversation exercises for generalizing dysarthria strategies into conversation-level speech production.   07/30/24: Dysphagia: Pt has been practicing HEP regularly at home. Today he completed lingual resistance exercises and pharyngeal strengthening exercises for optimizing pt's swallow safety and efficiency. Lingual medialization with resistance completed 10/10x with occasional min modeling and verbal cues. Lingual elevation and lateralization with resistance were each completed 10/10x with occasional min A for modeling. Effortful swallows and Masako each completed 10/10x with occasional min A with breaks given every 3 reps of Masako. Trained in strategy of educating family not  to talk to you when you are eating over the holidays, as well as using intent to chew and swallow even with guests or group meals. Spouse verbalizes she will cue him for this as well. Dysarthria: Revel enters without carryover of strategies for intelligible speech. Reviewed these, targeted carryover repeating and generating sentences with 4-5 syllable  words - Reyden required usual mod verbal cues and modeling initially for over articulation and saying each syllable. In structured description task, Seven carried over slow rate, volume and over articulation 8/10 phrases with occasional min A. Initiated training in speaking and swallowing (and walking) with intent - Kadeen and his spouse verbalized understanding. Spouse gave an example of when she interrupted Ercel as he was getting up from the table and he had a near fall as he lost attention and intent when standing.    07/24/24: Dysphagia: Pt has been practicing HEP regularly at home. SLP reviewed lingual resistance exercises and pharyngeal strengthening exercises for optimizing pt's swallow safety and efficiency. Lingual medialization with resistance completed 10/10x with occasional min modeling and verbal cues. Lingual elevation and lateralization with resistance were each completed 5/5x with occasional min A for modeling. Effortful swallows and Masako each completed 10/10x with occasional min A. Pt notes that he continues to experiencing increased coughing with saliva at night-time. SLP educated pt about practicing swallow HEP at night before bed to improve secretion management for saliva and reduce coughing. Pt is agreeable andwill try practicing HEP at night. Plan is to continue lingual and pharyngeal strengthening exercises for optimizing swallow safety and efficiency for QOL. Dysarthria: Targeted compensations for dysarthria (over-articulation, increased loudness) in sentence readings. SLP encouraged pt to utilize swallows before speaking to reduce choking on saliva during speech. Pt utilize a swallow prior to speaking in 90% of opportunities given occasional min verbal A. During 5-7 word sentence readings, pt utilized dysarthria compensations in 90% of opportunities given occasional min verbal A. SLP challenged pt to utilize dysarthria compensations during relevant phrases (personal information). Pt  continued to utilize compensations with 90% consistency given occasional min A. Pt is making progress with speech and swallow goals. SLP provided pt homework focused on /s/ sound production in multi-syllabic words and functional phrases; SLP encouraged pt to continue utilizing over-articulation during HEP for maximizing speech intelligibility. Plan is to continue sentence readings and functional phrase readings for improving pt use of dysarthria compensations.   07/16/24: Dysphagia: Initiated HEP for dysphagia and lingual weakness. See Patient Instructions. Lingual elevation and lateralization with resistance exercises completed 15/15x with occasional min modeling and verbal cues. Effortful swallows and Masako completed 10/10x with occasional min A. Targeted more frequent swallowing and swallowing prior to speaking to reduce choking on saliva. With usual verbal cues, Enrigue demonstrated swallow prior to speaking on structured speech task. He required frequent cues to swallow prior to speaking in conversation. Introduced concept of swallowing with intent, paying attention when eating and drinking  Dysarthria: Targeted compensations for dysarthria in structured speech task generating 3 sentence descriptions - Volume remained WNL with rare min A. Slow rate, over articulation and separating syllables required occasional min modeling and verbal cues. Ancel required cueing to ID and correct slurred/unintelligible utterances 3/3x initially. Targeted 3 syllable words - using separate syllables at slow rate to compensate for dysarthria generating sentences with these words -After initial modeling and instruction, Tayshawn was intelligible in sentences with 3 syllable words 12/12x. Generated list of personally relevant phrases to practice including biographical information he uses with doctors' offices, and  most frequent meal orders. At the end of session, Andru Id'd and self correct imprecise speech in conversation with  spouse with mod I.  07/11/24: Evaluation completed. No tx was conducted on this date.    PATIENT EDUCATION: Education details: Evaluation results/goal setting. Person educated: Patient and Spouse Education method: Explanation Education comprehension: verbalized understanding   GOALS: Goals reviewed with patient? Yes  SHORT TERM GOALS: Target date: 08/15/2024  Pt will achieve average >70 dB during 8 minute conversation given occasional min A. Baseline: Goal status: ONGOING  2.  Pt will perform pharyngeal and lingual strengthening exercises with 90% consistency when given rare min A. Baseline:  Goal status: ONGOING  3.  Pt and pt's family will utilize at least 1 external memory strategy for maximizing attention during medication management given occasional min A.  Baseline:  Goal status: ONGOING  4.  Pt will display use of aspiration precautions during PO trials of regular/thin-liquids in 90% of opportunities given rare min A Baseline:  Goal status: ONGOING  5.  Pt will achieve 95-97% speech intelligibility during 8 minute conversation with familiar listeners given occasional min A.  Baseline:  Goal status: ONGOING   LONG TERM GOALS: Target date: 10/03/2024  Pt will achieve average >70 dB during a 12 minute conversation given rare min A.  Baseline:  Goal status: ONGOING  2.  Pt will reduce score on EAT-10 PROM, indicating improved self-perception of swallowing function.  Baseline: 20/36 Goal status: ONGOING  3.  Pt and pt's family will describe and demonstrate at least 2 memory strategies for optimizing pt independence with certain ADL tasks (medication management) given rare min A. Baseline:  Goal status: ONGOING  4.  Pt will improve score on Communicative Effectiveness Survey, indicating speech is optimal for successful communication in different contexts.  Baseline: 11/24 Goal status: ONGOING   ASSESSMENT:  CLINICAL IMPRESSION: Patient is a 69 y.o. male  who was seen today for evaluation of motor speech, dysphagia, and cognition. Pt presents with mild-moderate dysarthria characterized by consistent, imprecise articulation and reduced vocal loudness at the conversational level. Pt also presents with mild-moderate cognitive communication deficits primarily characterized by short-term memory impairment, executive function deficits, and impulsive/inattentive behavior. Pt has hx of oropharyngeal dysphagia and currently still experience difficulty with swallowing. According to EAT-10, pt notes that he experiences difficulty with both solids and liquids during swallowing. Recommend ST targeting conversation-level dysarthria tx and cognitive-communication tx focused on establishing external memory aids for optimizing pt communication effectiveness, safety, and independence. In addition, recommend swallow tx focused on pharyngeal strengthening exercises and aspiration precaution strategies to improve pt perception of swallowing difficulties.   OBJECTIVE IMPAIRMENTS: include attention, memory, executive functioning, dysarthria, and dysphagia. These impairments are limiting patient from managing medications, ADLs/IADLs, effectively communicating at home and in community, and safety when swallowing. Factors affecting potential to achieve goals and functional outcome are N/A. Patient will benefit from skilled SLP services to address above impairments and improve overall function.  REHAB POTENTIAL: Good  PLAN:  SLP FREQUENCY: 2x/week  SLP DURATION: 12 weeks  PLANNED INTERVENTIONS: Aspiration precaution training, Pharyngeal strengthening exercises, Internal/external aids, Functional tasks, SLP instruction and feedback, Patient/family education, [phone] Treatment of speech (30 or 45 min) , and [id number] Treatment of swallowing function    Waddell Music, M.A., CF-SLP 08/13/2024, 11:09 AM      "

## 2024-08-13 NOTE — Therapy (Signed)
 " OUTPATIENT PHYSICAL THERAPY NEURO TREATMENT   Patient Name: Richard [name] MRN: [id number] DOB:12-21-1955, 69 y.o., male Today's Date: 08/13/2024  PCP: Ezekiel Charleston, MD REFERRING PROVIDER: Wynne Flatten, MD  END OF SESSION:  PT End of Session - 08/13/24 1011     Visit Number 19    Number of Visits 27   re-cert   Date for Recertification  98/69/73   re-cert; due to potential delay in scheduling   Authorization Type [facility]    Authorization Time Period 08/01/24-11/29/24    Authorization - Number of Visits 15    Progress Note Due on Visit 20    PT Start Time 1010    PT Stop Time 1100    PT Time Calculation (min) 50 min    Equipment Utilized During Treatment Gait belt    Activity Tolerance Patient tolerated treatment well    Behavior During Therapy WFL for tasks assessed/performed               Past Medical History:  Diagnosis Date   Coronary artery disease    a. cath 04/02/2014 3v dx 80-90% in D1, 80-90% mid LAD, 95% apical LAD, 95% distal inferior LV branch of LCx, total occlusion of prox RCA  b). Cath 04/03/2014 DES to mid LAD, DES x3 to prox D1, DAPT indefinitely. LCx lesion untreated   GERD (gastroesophageal reflux disease)    HIV (human immunodeficiency virus infection) (HCC)    Hypertension    Ischemic cardiomyopathy 08/09/2013   EF 20-25 percent by echo in 04/2014, s/p [employer] [place] model [id number] serial number [id number]   Stroke [facility])    Past Surgical History:  Procedure Laterality Date   CARDIAC CATHETERIZATION  04/02/2014   DR BURNARD   ESOPHAGOGASTRODUODENOSCOPY (EGD) WITH PROPOFOL  N/A 09/08/2018   Procedure: ESOPHAGOGASTRODUODENOSCOPY (EGD) WITH PROPOFOL ;  Surgeon: Rollin Dover, MD;  Location: [facility];  Service: Endoscopy;  Laterality: N/A;   IMPLANTABLE CARDIOVERTER DEFIBRILLATOR IMPLANT N/A 09/10/2014   Procedure: IMPLANTABLE CARDIOVERTER DEFIBRILLATOR IMPLANT;  Surgeon: Jerel Balding, MD;  [facility] [place] model [id number] serial number  [id number]   IR CT HEAD LTD  12/01/2023   IR PERCUTANEOUS ART THROMBECTOMY/INFUSION INTRACRANIAL INC DIAG ANGIO  12/01/2023   IR US  GUIDE VASC ACCESS RIGHT  12/01/2023   LEFT AND RIGHT HEART CATHETERIZATION WITH CORONARY ANGIOGRAM N/A 04/02/2014   Procedure: LEFT AND RIGHT HEART CATHETERIZATION WITH CORONARY ANGIOGRAM;  Surgeon: Debby DELENA Burnard, MD;  Location: [facility] CATH LAB;  Service: Cardiovascular;  Laterality: N/A;   MEDIAL COLLATERAL LIGAMENT REPAIR, KNEE Right 08/25/2015   Procedure: RIGHT THUMB RADICAL COLLATERAL LIGAMENT REPAIR;  Surgeon: Donnice Robinsons, MD;  Location: [facility] OR;  Service: Orthopedics;  Laterality: Right;   PERCUTANEOUS CORONARY STENT INTERVENTION (PCI-S) N/A 04/03/2014   Procedure: PERCUTANEOUS CORONARY STENT INTERVENTION (PCI-S);  Surgeon: Peter M Jordan, MD;  Location: [facility] CATH LAB;  Service: Cardiovascular;  Laterality: N/A;   RADIOLOGY WITH ANESTHESIA N/A 12/01/2023   Procedure: RADIOLOGY WITH ANESTHESIA;  Surgeon: Radiologist, Medication, MD;  Location: [facility] OR;  Service: Radiology;  Laterality: N/A;  BETHANY BRADFORD DILATION N/A 09/08/2018   Procedure: SAVORY DILATION;  Surgeon: Rollin Dover, MD;  Location: [facility];  Service: Endoscopy;  Laterality: N/A;   Patient Active Problem List   Diagnosis Date Noted   ICD (implantable cardioverter-defibrillator) discharge 03/23/2024   Hyperlipidemia 01/02/2024   CKD stage 3a, GFR 45-59 ml/min (HCC) 01/02/2024   Depression with anxiety 12/27/2023   Chronic systolic congestive heart failure (HCC) 12/23/2023  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   Acute ischemic right MCA stroke (HCC) 12/01/2023   Schizophrenia, unspecified (HCC) 03/07/2023   Recurrent major depression 03/07/2023   Pseudophakia of left eye 03/07/2023   Acid reflux 03/07/2023   Dyspepsia 03/07/2023   Anxiety state 03/07/2023   Cortical age-related cataract of right eye 03/07/2023   Psychotic disorder (HCC) 03/07/2023   CAD (coronary artery  disease) 03/07/2023   Abdominal mass 07/12/2021   Peripheral vascular disease 02/04/2021   Overweight 01/21/2020   Dysphagia 12/04/2018   Hiatal hernia 04/19/2018   Gastroesophageal reflux disease 04/03/2018   Ischemic congestive cardiomyopathy (HCC) 10/17/2017   Generalized anxiety disorder 10/17/2017   Prediabetes 10/16/2017   Acquired thrombocytopenia 10/16/2017   Chronic kidney disease, stage 3 (HCC) 10/16/2017   Primary erectile dysfunction 10/13/2017   Chronic combined systolic and diastolic heart failure (HCC) 12/30/2015   Traumatic rupture of collateral ligament of other finger at metacarpophalangeal and interphalangeal joint, subsequent encounter 10/30/2015   Traumatic rupture of collateral ligament of other finger at metacarpophalangeal and interphalangeal joint, initial encounter 08/12/2015   VT (ventricular tachycardia) (HCC) 12/31/2014   ICD (implantable cardioverter-defibrillator) in place 09/10/2014   At risk for sudden cardiac death 09/07/2014   Systolic heart failure (HCC) 08/09/2014   Coronary artery disease involving native coronary artery of native heart without angina pectoris 04/04/2014   Unstable angina (HCC) 04/03/2014   Cardiomyopathy, ischemic 03/22/2014   History of CVA (cerebrovascular accident) 02/20/2014   Human immunodeficiency virus infection (HCC) 02/20/2014   Essential hypertension 02/20/2014   Hyperlipidemia LDL goal <70 02/20/2014   Depression 02/20/2014   Cerebral infarction (HCC) 08/09/2006    ONSET DATE: 05/15/24 referral    REFERRING DIAG: I63.9 (ICD-10-CM) - Cerebral infarction, unspecified   THERAPY DIAG:  Unsteadiness on feet  Abnormal posture  Muscle weakness (generalized)  Difficulty in walking, not elsewhere classified  Other abnormalities of gait and mobility  Rationale for Evaluation and Treatment: Rehabilitation  SUBJECTIVE:                                                                                                                                                                                              SUBJECTIVE STATEMENT:  Pt reports some pain in his lower back today that started a few days ago, he reports that his back pain comes and goes. Pt saw Dr. MARLA last week, going to get Botox scheduled for 1/29 in his L hand. No other acute changes or falls since last visit.  Pt accompanied by: significant other, wife Richard [name]  PERTINENT HISTORY: CAD, GERD, HIV, HTN, ischemic cardiomyopathy, CVA  PAIN:  Are you having pain? No  PRECAUTIONS: Fall and ICD/Pacemaker; L hemi   PATIENT GOALS: to walk and use my hand   OBJECTIVE:  Note: Objective measures were completed at Evaluation unless otherwise noted.  DIAGNOSTIC FINDINGS: 12/01/23 head CT IMPRESSION: 1. Subtle hypoattenuation involving the right caudate and right lentiform nuclei which may reflect acute infarct. 2. No acute intracranial hemorrhage. 3. Remote infarct involving the right frontal operculum, insula, and superior right temporal lobe. 4. Asymmetric density of the right M1 segment. Recommend correlation with CTA. 5. ASPECTS is 8   GAIT: Findings: Gait Characteristics: step to pattern, decreased arm swing- Left, decreased step length- Right, decreased stance time- Left, decreased stride length, decreased hip/knee flexion- Left, decreased ankle dorsiflexion- Left, circumduction- Left, Left hip hike, trunk rotated posterior- Left, trunk flexed, narrow BOS, and poor foot clearance- Left, Assistive device utilized:Hemi walker, and Level of assistance: CGA and Min A                                                                                                             TREATMENT   Self-Care Vitals:   08/13/24 1015  BP: (!) 103/58  Pulse: (!) 52   BP assessed in RUE in sitting at rest, WNL for patient.    NMR:  Gait pattern: decreased hip/knee flexion- Left, decreased ankle dorsiflexion- Left, circumduction- Left, and lateral  lean- Right Distance walked: 230 ft to the R around track, 115 ft to the L around track before onset of fatigue, 115 ft to the L with no ankle weight Assistive device utilized: Quad cane large base Level of assistance: CGA Comments: with L AFO; with 4# L ankle weight - decreased speed; fatigues quicker during 2nd set of gait with 4# ankle weight; min A to weight shift onto LLE in stance during final lap    Physical Performance For STG assessment:  [facility] PT Assessment - 08/13/24 1027       Ambulation/Gait   Gait velocity 32.8 ft over 72 sec = 0.46 ft/sec   with [facility]     Standardized Balance Assessment   Standardized Balance Assessment Timed Up and Go Test;Five Times Sit to Stand    Five times sit to stand comments  21 sec   RUE     Timed Up and Go Test   TUG Normal TUG    Normal TUG (seconds) 56.91   with [facility]          PATIENT EDUCATION: Education details: continue HEP, results of OM and functional implications Person educated: Patient and Spouse Education method: Explanation, Demonstration, Tactile cues, and Verbal cues Education comprehension: verbalized understanding, returned demonstration, verbal cues required, tactile cues required, and needs further education  HOME EXERCISE PROGRAM: Access Code: [email or web address] URL: [email or web address] Date: 06/06/2024 Prepared by: Delon Pop  Exercises - Supine Bridge  - 1 x daily - 7 x weekly - 3 sets - 10 reps - Supine March with Posterior Pelvic Tilt  - 1 x daily - 7 x weekly - 3 sets - 10 reps - Mini  Squat with Counter Support  - 1 x daily - 7 x weekly - 3 sets - 8-10 reps - Standing Knee Flexion with Counter Support  - 1 x daily - 7 x weekly - 3 sets - 6-8 reps - Standing March with Counter Support  - 1 x daily - 7 x weekly - 3 sets - 8-10 reps - Staggered Sit-to-Stand  - 1 x daily - 7 x weekly - 3 sets - 10 reps - Side Stepping with Counter Support  - 1 x daily - 7 x weekly - 3 sets - 10 reps - Mini Squat with  Counter Support  - 1 x daily - 7 x weekly - 3 sets - 10 reps  GOALS: Goals reviewed with patient? Yes  SHORT TERM GOALS: Target date: 06/22/24  Pt will be independent with initial HEP for improved functional strength and balance  Baseline: to be provided  Goal status: MET  2.  Pt will improve gait speed to >/= .39m/s to demonstrate improved community ambulation  Baseline: .54m/s with HW + CGA; .21m/s with [facility] + CGA Goal status: MET  3.  Pt will improve TUG to </= 70 secs to demonstrated reduced fall risk  Baseline: 98s with HW + MinA; 79s [facility] + CGA Goal status: IN PROGRESS  4.  Pt will improve 5x STS to </= 22 sec to demo improved functional LE strength and balance   Baseline: 30.81 with CGA + R UE; 24/5s no UE + CGA Goal status: IN PRPOGRESS   LONG TERM GOALS: Target date: 07/13/24 (to match cert date)   Pt will be independent with final HEP for improved functional strength and balance  Baseline: to be provided; provided Goal status: MET  Pt will improve gait speed to >/= .32m/s to demonstrate improved community ambulation  Baseline: .42m/s with HW + CGA; .69m/s [facility] + CGA Goal status: IN PROGRESS  Pt will improve TUG to </= 50 secs to demonstrated reduced fall risk  Baseline: 98s with HW + MinA; 66s [facility] + CGA Goal status: IN PROGRESS  Pt will improve 5x STS to </= 15 sec to demo improved functional LE strength and balance   Baseline: 30.81 with CGA + R UE; 24s R UE + CGA Goal status: IN PROGRESS  NEW SHORT TERM GOALS:       08/10/24  Pt will be compliant with updated HEP for improved functional strength and balance    Baseline: to be updated   Goal status: MET  2. Pt will improve gait speed to >/= .95m/s to demonstrate improved community ambulation Baseline: .49m/s [facility] + CGA, 0.24m/s [facility] and CGA (1/5) Goal status: IN PROGRESS  3. Pt will improve TUG to </= 50 secs to demonstrated reduced fall risk Baseline: 66s [facility] + CGA, 56.91 sec [facility] and CGA  (1/5) Goal status: IN PROGRESS  4. Pt will improve 5x STS to </= 20 sec to demo improved functional LE strength and balance  Baseline:  24s R UE + CGA, 21 sec RUE and CGA (1/5) Goal status: IN PROGRESS   NEW LONG TERM GOALS: 09/07/24  Pt will be compliant with updated final HEP for improved functional strength and balance    Baseline: to be updated   Goal status: NEW  2. Pt will improve gait speed to >/= .2m/s to demonstrate improved community ambulation Baseline: .28m/s with HW + CGA, 0.31m/s [facility] and CGA (1/5) Goal status: REVISED/DOWNGRADED  3. Pt will improve TUG to </= 50 secs to demonstrated  reduced fall risk Baseline: 66s [facility] + CGA, 56.91 sec [facility] and CGA (1/5) Goal status: REVISED/DOWNGRADED  4. Pt will improve 5x STS to </= 18 sec to demo improved functional LE strength and balance  Baseline: 24s R UE + CGA, 21 sec RUE and CGA (1/5) Goal status: REVISED/DOWNGRADED   ASSESSMENT:  CLINICAL IMPRESSION: Emphasis of skilled PT session on continuing to assess vitals, assessing STG, and working on HIGT with [facility]. Pt's BP remains low which is normal for him. He has met 1/4 STG due to being compliant with his initial HEP. He is making progress towards remaining 3/4 STG as he has improved his gait speed, TUG score, and 5xSTS score as compared to previous assessments, just did not improve enough to meet STG. LTG revised due to slow progress. HIGT with focus on added weight on LLE with removal of weight by end of session. Pt does exhibit improved endurance with increased gait distance covered this visit, does fatigue with added weight. He continues to benefit from skilled PT services to work on LLE NMR as well as to work towards increased safety and independence with his functional mobility. Continue POC.    OBJECTIVE IMPAIRMENTS: Abnormal gait, decreased balance, decreased coordination, decreased endurance, decreased knowledge of condition, decreased knowledge of use of DME, decreased  ROM, decreased strength, impaired tone, impaired UE functional use, and postural dysfunction.   ACTIVITY LIMITATIONS: carrying, lifting, bending, standing, squatting, stairs, transfers, bed mobility, locomotion level, and caring for others  PARTICIPATION LIMITATIONS: meal prep, cleaning, interpersonal relationship, driving, shopping, community activity, occupation, and yard work  PERSONAL FACTORS: Age, Fitness, Past/current experiences, Time since onset of injury/illness/exacerbation, Transportation, and 3+ comorbidities: see above are also affecting patient's functional outcome.   REHAB POTENTIAL: Fair time since onset  CLINICAL DECISION MAKING: Stable/uncomplicated  EVALUATION COMPLEXITY: Low  PLAN:  PT FREQUENCY: 2x/week  PT DURATION: other: 7 weeks  PLANNED INTERVENTIONS: [id number]- PT Re-evaluation, 97750- Physical Performance Testing, 97110-Therapeutic exercises, 97530- Therapeutic activity, [id number]- Neuromuscular re-education, 97535- Self Care, [id number]- Manual therapy, [id number]- Gait training, [phone]- Orthotic Initial, [phone]- Orthotic/Prosthetic subsequent, [phone]- Aquatic Therapy, [phone] (1-2 muscles), [phone] (3+ muscles)- Dry Needling, Patient/Family education, Balance training, Stair training, Taping, Vestibular training, Visual/preceptual remediation/compensation, DME instructions, and Wheelchair mobility training  PLAN FOR NEXT SESSION: 20th PN, HIGT! Continue working with [facility], continue use of L LE, weight shifting in bars with toe taps, improve L hip flexion strength. Facilitate left weight shifting during ambulation/ literally anything to get more weight on the left leg (modified SLS, R LE step ups so he has to spend more time on L, etc); staggered sit to stands, L hip abd strengthening (resisted dot taps), did they get [facility]?, tall kneel at bench on mat table, resisted LLE forward step    Waddell Southgate, PT Waddell Southgate, PT, DPT, CSRS   08/13/2024, 11:05 AM        "

## 2024-08-15 ENCOUNTER — Ambulatory Visit: Admitting: Physical Therapy

## 2024-08-15 ENCOUNTER — Ambulatory Visit: Admitting: Occupational Therapy

## 2024-08-15 ENCOUNTER — Ambulatory Visit

## 2024-08-15 VITALS — BP 98/56 | HR 50

## 2024-08-15 DIAGNOSIS — R2689 Other abnormalities of gait and mobility: Secondary | ICD-10-CM

## 2024-08-15 DIAGNOSIS — R262 Difficulty in walking, not elsewhere classified: Secondary | ICD-10-CM

## 2024-08-15 DIAGNOSIS — M6281 Muscle weakness (generalized): Secondary | ICD-10-CM

## 2024-08-15 DIAGNOSIS — R1310 Dysphagia, unspecified: Secondary | ICD-10-CM

## 2024-08-15 DIAGNOSIS — R293 Abnormal posture: Secondary | ICD-10-CM

## 2024-08-15 DIAGNOSIS — R41841 Cognitive communication deficit: Secondary | ICD-10-CM

## 2024-08-15 DIAGNOSIS — R2681 Unsteadiness on feet: Secondary | ICD-10-CM

## 2024-08-15 NOTE — Therapy (Signed)
 " OUTPATIENT SPEECH LANGUAGE PATHOLOGY TREATMENT   Patient Name: Richard [name] MRN: [id number] DOB:05-27-1956, 69 y.o., male Today's Date: 08/15/2024  PCP: [name], MD REFERRING PROVIDER: [name], MD  END OF SESSION:  End of Session - 08/15/24 0932     Visit Number 6    Number of Visits 25    Date for Recertification  10/03/24    SLP Start Time 0848    SLP Stop Time  0931    SLP Time Calculation (min) 43 min    Activity Tolerance Patient tolerated treatment well              Past Medical History:  Diagnosis Date   Coronary artery disease    a. cath 04/02/2014 3v dx 80-90% in D1, 80-90% mid LAD, 95% apical LAD, 95% distal inferior LV branch of LCx, total occlusion of prox RCA  b). Cath 04/03/2014 DES to mid LAD, DES x3 to prox D1, DAPT indefinitely. LCx lesion untreated   GERD (gastroesophageal reflux disease)    HIV (human immunodeficiency virus infection) (HCC)    Hypertension    Ischemic cardiomyopathy 08/09/2013   EF 20-25 percent by echo in 04/2014, s/p [employer] [place] model [id number] serial number [id number]   [facility])    Past Surgical History:  Procedure Laterality Date   CARDIAC CATHETERIZATION  04/02/2014   DR BURNARD   ESOPHAGOGASTRODUODENOSCOPY (EGD) WITH PROPOFOL  N/A 09/08/2018   Procedure: ESOPHAGOGASTRODUODENOSCOPY (EGD) WITH PROPOFOL ;  Surgeon: Rollin Dover, MD;  Location: [facility] ENDOSCOPY;  Service: Endoscopy;  Laterality: N/A;   IMPLANTABLE CARDIOVERTER DEFIBRILLATOR IMPLANT N/A 09/10/2014   Procedure: IMPLANTABLE CARDIOVERTER DEFIBRILLATOR IMPLANT;  Surgeon: Jerel Balding, MD;  [facility] [place] model [id number] serial number [id number]   IR CT HEAD LTD  12/01/2023   IR PERCUTANEOUS ART THROMBECTOMY/INFUSION INTRACRANIAL INC DIAG ANGIO  12/01/2023   IR US  GUIDE VASC ACCESS RIGHT  12/01/2023   LEFT AND RIGHT HEART CATHETERIZATION WITH CORONARY ANGIOGRAM N/A 04/02/2014   Procedure: LEFT AND RIGHT HEART CATHETERIZATION WITH CORONARY  ANGIOGRAM;  Surgeon: Debby DELENA Burnard, MD;  Location: [facility] CATH LAB;  Service: Cardiovascular;  Laterality: N/A;   MEDIAL COLLATERAL LIGAMENT REPAIR, KNEE Right 08/25/2015   Procedure: RIGHT THUMB RADICAL COLLATERAL LIGAMENT REPAIR;  Surgeon: Donnice Robinsons, MD;  Location: [facility] OR;  Service: Orthopedics;  Laterality: Right;   PERCUTANEOUS CORONARY STENT INTERVENTION (PCI-S) N/A 04/03/2014   Procedure: PERCUTANEOUS CORONARY STENT INTERVENTION (PCI-S);  Surgeon: Peter M Jordan, MD;  Location: [facility] CATH LAB;  Service: Cardiovascular;  Laterality: N/A;   RADIOLOGY WITH ANESTHESIA N/A 12/01/2023   Procedure: RADIOLOGY WITH ANESTHESIA;  Surgeon: Radiologist, Medication, MD;  Location: [facility] OR;  Service: Radiology;  Laterality: N/A;  BETHANY BRADFORD DILATION N/A 09/08/2018   Procedure: SAVORY DILATION;  Surgeon: Rollin Dover, MD;  Location: [facility]OS[facility];  Service: Endoscopy;  Laterality: N/A;   Patient Active Problem List   Diagnosis Date Noted   ICD (implantable cardioverter-defibrillator) discharge 03/23/2024   Hyperlipidemia 01/02/2024   CKD stage 3a, GFR 45-59 ml/min (HCC) 01/02/2024   Depression with anxiety 12/27/2023   Chronic systolic congestive heart failure (HCC) 12/23/2023   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   Acute ischemic right MCA stroke (HCC) 12/01/2023   Schizophrenia, unspecified (HCC) 03/07/2023   Recurrent major depression 03/07/2023   Pseudophakia of left eye 03/07/2023   Acid reflux 03/07/2023   Dyspepsia 03/07/2023   Anxiety state 03/07/2023   Cortical age-related cataract of right eye 03/07/2023  Psychotic disorder (HCC) 03/07/2023   CAD (coronary artery disease) 03/07/2023   Abdominal mass 07/12/2021   Peripheral vascular disease 02/04/2021   Overweight 01/21/2020   Dysphagia 12/04/2018   Hiatal hernia 04/19/2018   Gastroesophageal reflux disease 04/03/2018   Ischemic congestive cardiomyopathy (HCC) 10/17/2017   Generalized anxiety disorder 10/17/2017    Prediabetes 10/16/2017   Acquired thrombocytopenia 10/16/2017   Chronic kidney disease, stage 3 (HCC) 10/16/2017   Primary erectile dysfunction 10/13/2017   Chronic combined systolic and diastolic heart failure (HCC) 12/30/2015   Traumatic rupture of collateral ligament of other finger at metacarpophalangeal and interphalangeal joint, subsequent encounter 10/30/2015   Traumatic rupture of collateral ligament of other finger at metacarpophalangeal and interphalangeal joint, initial encounter 08/12/2015   VT (ventricular tachycardia) (HCC) 12/31/2014   ICD (implantable cardioverter-defibrillator) in place 09/10/2014   At risk for sudden cardiac death 04-Sep-2014   Systolic heart failure (HCC) 08/09/2014   Coronary artery disease involving native coronary artery of native heart without angina pectoris 04/04/2014   Unstable angina (HCC) 04/03/2014   Cardiomyopathy, ischemic 03/22/2014   History of CVA (cerebrovascular accident) 02/20/2014   Human immunodeficiency virus infection (HCC) 02/20/2014   Essential hypertension 02/20/2014   Hyperlipidemia LDL goal <70 02/20/2014   Depression 02/20/2014   Cerebral infarction (HCC) 08/09/2006    ONSET DATE: 11/2023  REFERRING DIAG: I63.9 (ICD-10-CM) - Cerebral infarction, unspecified  THERAPY DIAG:  Dysarthria and anarthria  Dysphagia, unspecified type  Cognitive communication deficit  Rationale for Evaluation and Treatment: Rehabilitation  SUBJECTIVE:   SUBJECT.I got choked this morning Pt accompanied by: self and significant other  PERTINENT HISTORY: Pt is a 69 y.o. male who has a recent hx of CVA back in April, 2025. Pt noted residual deficits include dysarthria, cognitive-communication deficits and dysphagia. Pt's wife was present and provided additional hx. Pt's wife notices that pt's speech is low in volume during conversations. Pt recently received HH ST, primarily targeting dysarthria at sentence and conversation levels. Pt also  received ST in CIR 11/2023-12/2023, targeting swallowing, dysarthria, and cognition. Pt currently consumes regular/thin-liquid diet. Pt endorses coughing and choking on secretions on occasion. Pt's wife handles household responsibilities (finances, medication management, etc.). Pt's wife states that pt tends to lose focus during longer tasks. Pt enjoys watching sports at home. PMHx: CAD, GERD, HIV, HTN, ischemic cardiomyopathy, CVA.  PAIN:  Are you having pain? No  FALLS: Has patient fallen in last 6 months?  See PT evaluation for details  LIVING ENVIRONMENT: Lives with: lives with their family and lives with their spouse Lives in: House/apartment  PLOF:  Level of assistance: Comment: Independent with driving and medication management; wife still did finances  Employment: On disability  PATIENT GOALS: To improve thinking skills, swallowing, and speech.  OBJECTIVE:  Note: Objective measures were completed at Evaluation unless otherwise noted.  DIAGNOSTIC FINDINGS: MBSS from 12/08/23 Patient presents with mild oropharyngeal dysphagia. Oral phase is characterized by reduced lingual movement and labial seal resulting in anterior and posterior spillage. Patient independently utilized multiple swallows and digital sweep when necessary to clear oral residuals. Pharyngeal phase is remarkable for reduced epiglottic inversion and pharyngeal stripping wave resulting in mild pharyngeal residuals, mainly present in the vallecula and pyriform sinuses. Penetration present x2 with thin liquids (x1 via straw, x1 during liquid wash), though likely due to premature spillage and mistiming of epiglottic inversion and laryngeal vestibular closure. Pill consumed in puree, requiring additional bites of PO due to suspected esophageal retention. Recommend continuation of D2/thin liquid diet  with small, single sips of liquids. Patient should continue to utilize digital sweep to clear buccal pocketing of solids. Recommend  medications administered whole in puree with additional bites to promote esophogeal clearance.     COGNITION: Overall cognitive status: Impaired Areas of impairment:  Attention: Impaired: Selective Memory: Impaired: Immediate Working Short term Psychologist, Educational function: Impaired: Impulse control, Organization, Planning, and Error awareness Behavior: Impulsive Functional deficits: Medication management, time management.     MOTOR SPEECH: Overall motor speech: impaired Level of impairment: Conversation Respiration: thoracic breathing and diaphragmatic/abdominal breathing Phonation: normal and low vocal intensity Resonance: WFL Articulation: Impaired: conversation Intelligibility: Intelligibility reduced Motor planning: Appears intact Motor speech errors: aware and consistent Interfering components: N/A Effective technique: increased vocal intensity and pacing  ORAL MOTOR EXAMINATION: Overall status: Impaired:   Lingual: Left (Symmetry and Strength) Comments: N/A   STANDARDIZED ASSESSMENTS: SLUMS: 20/30  PATIENT REPORTED OUTCOME MEASURES (PROM): EAT-10: 20/36 and Communication Effectiveness Survey: 11/24                                                                                                                            TREATMENT DATE:  08/16/23: Dysphagia: Pt has been practicing HEP regularly at home. Today he completed pharyngeal strengthening exercises for optimizing pt's swallow safety and efficiency. Effortful swallows and Masako were completed with 100% consistency given rare min A. Pt completed effortful swallows 20x and Masako 5x. SLP challenged pt to complete lingual strengthening (isometric) exercises in the medial and right/left lateral positions. Pt performed medial and lateral lingual exercises with 100% consistency and completed 8 reps/5x for each position.  Plan is to continue pharyngeal/lingual swallow exercises.  Cognition: Pt enters with some carryover of  dysarthria strategies (over-articulation and increased volume) requiring occasional min A during structured tasks. SLP introduced memory compensations for optimizing pt's short-term memory recall at home. Pt notes that he forgets to turn the sink and lights off at home. SLP educated pt and pt's wife in using visual cues for improving pt's short-term memory recall and attention to objects in his environment. Pt states that he has forgotten his walker in the bathroom twice recently. SLP encouraged pt to stop, look at his surroundings before proceeding to leave the bathroom to support attention for remembering his walker to optimize safety awareness. SLP inquired pt about his current use of a calendar and other external memory supports at home. Pt's wife notes that they have a new 2026 calendar that has not been put up yet. SLP educated pt in using external memory supports for increasing pt independence with his short-term memory recall, reducing caregiver burden. SLP provided pt a temporary January, 2026 calendar and had pt identify the current date and an upcoming date for pt's favorite sports teams. Pt was successful with identifying the current date but required min verbal A to identify an upcoming sports game due to pt's selective attention/slight impulsivity. Pt is challenged to use calendar at home to write down pertinent appointments/information for  optimizing short-term memory recall. SLP provided pt memory compensations sheet to utilize for incorporating more short-term memory strategies at home. Pt and pt's wife are agreeable to trying new memory compensations. Plan is to continue dysarthria tx and building cognitive-communication support systems. Pt was asked to bring 2 of his current medications to practice medication management in upcoming session.   08/14/23: Dysphagia: Pt has been practicing HEP regularly at home. Today he completed pharyngeal strengthening exercises for optimizing pt's  swallow safety and efficiency. Effortful swallows and Masako were completed with 100% consistency given rare min A. Pt completed effortful swallows 20x and Masako 10x. Pt notes that his throat feels more irritated after performing pharyngeal strengthening exercises. Pt's wife states that their doctor wanted to inform SLP about getting an Endoscopy exam performed for checking throat health/swallowing. SLP educated family about importance of objective instrumental testing for examining throat health and swallow function. Plan is to continue pharyngeal swallow exercises and SLP to f/u with pt's PCP about pursuing an objective examination of swallowing, specifically FEES.   Dysarthria: Pt enters with some carryover of dysarthria strategies (over-articulation and increased volume). Pt engaged in structured description task, where he was required to use dysarthria strategies for responses. Pt utilized dysarthria strategies suring this task in 90% of opportunities given occasional min A. SLP engaged pt in structured conversation, while utilizing dual-task training for challenging pt's selective attention to using dysarthria strategies. Pt used dysarthria strategies with 85% consistency during structured conversation given occasional min A. Pt displayed adequate use of dysarthria strategies during dual-task challenge. Pt is making progress with speech goals. Plan is to continue structured conversation exercises for generalizing dysarthria strategies into conversation-level speech production.   07/30/24: Dysphagia: Pt has been practicing HEP regularly at home. Today he completed lingual resistance exercises and pharyngeal strengthening exercises for optimizing pt's swallow safety and efficiency. Lingual medialization with resistance completed 10/10x with occasional min modeling and verbal cues. Lingual elevation and lateralization with resistance were each completed 10/10x with occasional min A for modeling. Effortful  swallows and Masako each completed 10/10x with occasional min A with breaks given every 3 reps of Masako. Trained in strategy of educating family not to talk to you when you are eating over the holidays, as well as using intent to chew and swallow even with guests or group meals. Spouse verbalizes she will cue him for this as well. Dysarthria: Vahan enters without carryover of strategies for intelligible speech. Reviewed these, targeted carryover repeating and generating sentences with 4-5 syllable words - Gil required usual mod verbal cues and modeling initially for over articulation and saying each syllable. In structured description task, Oden carried over slow rate, volume and over articulation 8/10 phrases with occasional min A. Initiated training in speaking and swallowing (and walking) with intent - Izaah and his spouse verbalized understanding. Spouse gave an example of when she interrupted Alon as he was getting up from the table and he had a near fall as he lost attention and intent when standing.    07/24/24: Dysphagia: Pt has been practicing HEP regularly at home. SLP reviewed lingual resistance exercises and pharyngeal strengthening exercises for optimizing pt's swallow safety and efficiency. Lingual medialization with resistance completed 10/10x with occasional min modeling and verbal cues. Lingual elevation and lateralization with resistance were each completed 5/5x with occasional min A for modeling. Effortful swallows and Masako each completed 10/10x with occasional min A. Pt notes that he continues to experiencing increased coughing with saliva at night-time. SLP educated  pt about practicing swallow HEP at night before bed to improve secretion management for saliva and reduce coughing. Pt is agreeable andwill try practicing HEP at night. Plan is to continue lingual and pharyngeal strengthening exercises for optimizing swallow safety and efficiency for QOL. Dysarthria: Targeted  compensations for dysarthria (over-articulation, increased loudness) in sentence readings. SLP encouraged pt to utilize swallows before speaking to reduce choking on saliva during speech. Pt utilize a swallow prior to speaking in 90% of opportunities given occasional min verbal A. During 5-7 word sentence readings, pt utilized dysarthria compensations in 90% of opportunities given occasional min verbal A. SLP challenged pt to utilize dysarthria compensations during relevant phrases (personal information). Pt continued to utilize compensations with 90% consistency given occasional min A. Pt is making progress with speech and swallow goals. SLP provided pt homework focused on /s/ sound production in multi-syllabic words and functional phrases; SLP encouraged pt to continue utilizing over-articulation during HEP for maximizing speech intelligibility. Plan is to continue sentence readings and functional phrase readings for improving pt use of dysarthria compensations.   07/16/24: Dysphagia: Initiated HEP for dysphagia and lingual weakness. See Patient Instructions. Lingual elevation and lateralization with resistance exercises completed 15/15x with occasional min modeling and verbal cues. Effortful swallows and Masako completed 10/10x with occasional min A. Targeted more frequent swallowing and swallowing prior to speaking to reduce choking on saliva. With usual verbal cues, Edd demonstrated swallow prior to speaking on structured speech task. He required frequent cues to swallow prior to speaking in conversation. Introduced concept of swallowing with intent, paying attention when eating and drinking  Dysarthria: Targeted compensations for dysarthria in structured speech task generating 3 sentence descriptions - Volume remained WNL with rare min A. Slow rate, over articulation and separating syllables required occasional min modeling and verbal cues. Caldwell required cueing to ID and correct slurred/unintelligible  utterances 3/3x initially. Targeted 3 syllable words - using separate syllables at slow rate to compensate for dysarthria generating sentences with these words -After initial modeling and instruction, Kwasi was intelligible in sentences with 3 syllable words 12/12x. Generated list of personally relevant phrases to practice including biographical information he uses with doctors' offices, and most frequent meal orders. At the end of session, Jacinto Id'd and self correct imprecise speech in conversation with spouse with mod I.  07/11/24: Evaluation completed. No tx was conducted on this date.    PATIENT EDUCATION: Education details: Evaluation results/goal setting. Person educated: Patient and Spouse Education method: Explanation Education comprehension: verbalized understanding   GOALS: Goals reviewed with patient? Yes  SHORT TERM GOALS: Target date: 08/15/2024  Pt will achieve average >70 dB during 8 minute conversation given occasional min A. Baseline: Goal status: ONGOING  2.  Pt will perform pharyngeal and lingual strengthening exercises with 90% consistency when given rare min A. Baseline:  Goal status: ONGOING  3.  Pt and pt's family will utilize at least 1 external memory strategy for maximizing attention during medication management given occasional min A.  Baseline:  Goal status: ONGOING  4.  Pt will display use of aspiration precautions during PO trials of regular/thin-liquids in 90% of opportunities given rare min A Baseline:  Goal status: ONGOING  5.  Pt will achieve 95-97% speech intelligibility during 8 minute conversation with familiar listeners given occasional min A.  Baseline:  Goal status: ONGOING   LONG TERM GOALS: Target date: 10/03/2024  Pt will achieve average >70 dB during a 12 minute conversation given rare min A.  Baseline:  Goal status: ONGOING  2.  Pt will reduce score on EAT-10 PROM, indicating improved self-perception of swallowing function.   Baseline: 20/36 Goal status: ONGOING  3.  Pt and pt's family will describe and demonstrate at least 2 memory strategies for optimizing pt independence with certain ADL tasks (medication management) given rare min A. Baseline:  Goal status: ONGOING  4.  Pt will improve score on Communicative Effectiveness Survey, indicating speech is optimal for successful communication in different contexts.  Baseline: 11/24 Goal status: ONGOING   ASSESSMENT:  CLINICAL IMPRESSION: Patient is a 69 y.o. male who was seen today for evaluation of motor speech, dysphagia, and cognition. Pt presents with mild-moderate dysarthria characterized by consistent, imprecise articulation and reduced vocal loudness at the conversational level. Pt also presents with mild-moderate cognitive communication deficits primarily characterized by short-term memory impairment, executive function deficits, and impulsive/inattentive behavior. Pt has hx of oropharyngeal dysphagia and currently still experience difficulty with swallowing. According to EAT-10, pt notes that he experiences difficulty with both solids and liquids during swallowing. Recommend ST targeting conversation-level dysarthria tx and cognitive-communication tx focused on establishing external memory aids for optimizing pt communication effectiveness, safety, and independence. In addition, recommend swallow tx focused on pharyngeal strengthening exercises and aspiration precaution strategies to improve pt perception of swallowing difficulties.   OBJECTIVE IMPAIRMENTS: include attention, memory, executive functioning, dysarthria, and dysphagia. These impairments are limiting patient from managing medications, ADLs/IADLs, effectively communicating at home and in community, and safety when swallowing. Factors affecting potential to achieve goals and functional outcome are N/A. Patient will benefit from skilled SLP services to address above impairments and improve overall  function.  REHAB POTENTIAL: Good  PLAN:  SLP FREQUENCY: 2x/week  SLP DURATION: 12 weeks  PLANNED INTERVENTIONS: Aspiration precaution training, Pharyngeal strengthening exercises, Internal/external aids, Functional tasks, SLP instruction and feedback, Patient/family education, [phone] Treatment of speech (30 or 45 min) , and [id number] Treatment of swallowing function    Waddell Music, M.A., CF-SLP 08/15/2024, 10:45 AM      "

## 2024-08-15 NOTE — Patient Instructions (Signed)
 Use visual cues to remind you to turn things off.  Before you leave the bathroom:  Stop Check your surroundings Grab walker Leave bathroom  Memory Compensation Strategies  Use WARM strategy. W= write it down A=  associate it R=  repeat it M=  make a mental picture  You can keep a Memory Notebook. Use a 3-ring notebook with sections for the following:  calendar, important names and phone numbers, medications, doctors names/phone numbers, to do list/reminders, and a section to journal what you did each day  Use a calendar to write appointments down.  Write yourself a schedule for the day.  This can be placed on the calendar or in a separate section of the Memory Notebook.  Keeping a regular schedule can help memory.  Use medication organizer with sections for each day or morning/evening pills  You may need help loading it  Keep a basket, or pegboard by the door.   Place items that you need to take out with you in the basket or on the pegboard.  You may also want to include a message board for reminders.  Use sticky notes. Place sticky notes with reminders in a place where the task is performed.  For example:  turn off the stove placed by the stove, lock the door placed on the door at eye level, take your medications on the bathroom mirror or by the place where you normally take your medications  Use alarms, timers, and/or a reminder app. Use while cooking to remind yourself to check on food or as a reminder to take your medicine, or as a reminder to make a call, or as a reminder to perform another task, etc.  Use a voice recorder app or small tape recorder to record important information and notes for yourself. Go back at the end of the day and listen to these.

## 2024-08-15 NOTE — Therapy (Signed)
 " OUTPATIENT PHYSICAL THERAPY NEURO TREATMENT - 20th PN   Patient Name: Richard [name] MRN: [id number] DOB:12/06/1955, 69 y.o., male Today's Date: 08/15/2024  PCP: Ezekiel Charleston, MD REFERRING PROVIDER: Wynne Flatten, MD  Physical Therapy Progress Note   Dates of Reporting Period: 06/25/2024 - 08/15/2024  See Note from 08/13/24 for STG assessment and objective outcome measures.  Thank you for the referral of this patient. Waddell Southgate, PT, DPT, CSRS   END OF SESSION:  PT End of Session - 08/15/24 1014     Visit Number 20    Number of Visits 27   re-cert   Date for Recertification  98/69/73   re-cert; due to potential delay in scheduling   Authorization Type [facility]    Authorization Time Period 08/01/24-11/29/24    Authorization - Number of Visits 15    Progress Note Due on Visit 20    PT Start Time 1013    PT Stop Time 1056    PT Time Calculation (min) 43 min    Equipment Utilized During Treatment Gait belt    Activity Tolerance Patient tolerated treatment well    Behavior During Therapy WFL for tasks assessed/performed                Past Medical History:  Diagnosis Date   Coronary artery disease    a. cath 04/02/2014 3v dx 80-90% in D1, 80-90% mid LAD, 95% apical LAD, 95% distal inferior LV branch of LCx, total occlusion of prox RCA  b). Cath 04/03/2014 DES to mid LAD, DES x3 to prox D1, DAPT indefinitely. LCx lesion untreated   GERD (gastroesophageal reflux disease)    HIV (human immunodeficiency virus infection) (HCC)    Hypertension    Ischemic cardiomyopathy 08/09/2013   EF 20-25 percent by echo in 04/2014, s/p [employer] model [id number] serial number [id number]   Stroke [facility])    Past Surgical History:  Procedure Laterality Date   CARDIAC CATHETERIZATION  04/02/2014   DR BURNARD   ESOPHAGOGASTRODUODENOSCOPY (EGD) WITH PROPOFOL  N/A 09/08/2018   Procedure: ESOPHAGOGASTRODUODENOSCOPY (EGD) WITH PROPOFOL ;  Surgeon: Rollin Dover, MD;  Location: [facility]  ENDOSCOPY;  Service: Endoscopy;  Laterality: N/A;   IMPLANTABLE CARDIOVERTER DEFIBRILLATOR IMPLANT N/A 09/10/2014   Procedure: IMPLANTABLE CARDIOVERTER DEFIBRILLATOR IMPLANT;  Surgeon: Jerel Balding, MD;  [facility] [employer] model [id number] serial number [id number]   IR CT HEAD LTD  12/01/2023   IR PERCUTANEOUS ART THROMBECTOMY/INFUSION INTRACRANIAL INC DIAG ANGIO  12/01/2023   IR US  GUIDE VASC ACCESS RIGHT  12/01/2023   LEFT AND RIGHT HEART CATHETERIZATION WITH CORONARY ANGIOGRAM N/A 04/02/2014   Procedure: LEFT AND RIGHT HEART CATHETERIZATION WITH CORONARY ANGIOGRAM;  Surgeon: Debby DELENA Burnard, MD;  Location: [facility] CATH LAB;  Service: Cardiovascular;  Laterality: N/A;   MEDIAL COLLATERAL LIGAMENT REPAIR, KNEE Right 08/25/2015   Procedure: RIGHT THUMB RADICAL COLLATERAL LIGAMENT REPAIR;  Surgeon: Donnice Robinsons, MD;  Location: [facility] OR;  Service: Orthopedics;  Laterality: Right;   PERCUTANEOUS CORONARY STENT INTERVENTION (PCI-S) N/A 04/03/2014   Procedure: PERCUTANEOUS CORONARY STENT INTERVENTION (PCI-S);  Surgeon: Peter M Jordan, MD;  Location: [facility] CATH LAB;  Service: Cardiovascular;  Laterality: N/A;   RADIOLOGY WITH ANESTHESIA N/A 12/01/2023   Procedure: RADIOLOGY WITH ANESTHESIA;  Surgeon: Radiologist, Medication, MD;  Location: [facility] OR;  Service: Radiology;  Laterality: N/A;  BETHANY BRADFORD DILATION N/A 09/08/2018   Procedure: SAVORY DILATION;  Surgeon: Rollin Dover, MD;  Location: [facility] ENDOSCOPY;  Service: Endoscopy;  Laterality: N/A;   Patient  Active Problem List   Diagnosis Date Noted   ICD (implantable cardioverter-defibrillator) discharge 03/23/2024   Hyperlipidemia 01/02/2024   CKD stage 3a, GFR 45-59 ml/min (HCC) 01/02/2024   Depression with anxiety 12/27/2023   Chronic systolic congestive heart failure (HCC) 12/23/2023   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   Acute ischemic right MCA stroke (HCC) 12/01/2023   Schizophrenia, unspecified (HCC) 03/07/2023   Recurrent major  depression 03/07/2023   Pseudophakia of left eye 03/07/2023   Acid reflux 03/07/2023   Dyspepsia 03/07/2023   Anxiety state 03/07/2023   Cortical age-related cataract of right eye 03/07/2023   Psychotic disorder (HCC) 03/07/2023   CAD (coronary artery disease) 03/07/2023   Abdominal mass 07/12/2021   Peripheral vascular disease 02/04/2021   Overweight 01/21/2020   Dysphagia 12/04/2018   Hiatal hernia 04/19/2018   Gastroesophageal reflux disease 04/03/2018   Ischemic congestive cardiomyopathy (HCC) 10/17/2017   Generalized anxiety disorder 10/17/2017   Prediabetes 10/16/2017   Acquired thrombocytopenia 10/16/2017   Chronic kidney disease, stage 3 (HCC) 10/16/2017   Primary erectile dysfunction 10/13/2017   Chronic combined systolic and diastolic heart failure (HCC) 12/30/2015   Traumatic rupture of collateral ligament of other finger at metacarpophalangeal and interphalangeal joint, subsequent encounter 10/30/2015   Traumatic rupture of collateral ligament of other finger at metacarpophalangeal and interphalangeal joint, initial encounter 08/12/2015   VT (ventricular tachycardia) (HCC) 12/31/2014   ICD (implantable cardioverter-defibrillator) in place 09/10/2014   At risk for sudden cardiac death 09/17/2014   Systolic heart failure (HCC) 08/09/2014   Coronary artery disease involving native coronary artery of native heart without angina pectoris 04/04/2014   Unstable angina (HCC) 04/03/2014   Cardiomyopathy, ischemic 03/22/2014   History of CVA (cerebrovascular accident) 02/20/2014   Human immunodeficiency virus infection (HCC) 02/20/2014   Essential hypertension 02/20/2014   Hyperlipidemia LDL goal <70 02/20/2014   Depression 02/20/2014   Cerebral infarction (HCC) 08/09/2006    ONSET DATE: 05/15/24 referral    REFERRING DIAG: I63.9 (ICD-10-CM) - Cerebral infarction, unspecified   THERAPY DIAG:  Unsteadiness on feet  Abnormal posture  Muscle weakness  (generalized)  Difficulty in walking, not elsewhere classified  Other abnormalities of gait and mobility  Rationale for Evaluation and Treatment: Rehabilitation  SUBJECTIVE:                                                                                                                                                                                             SUBJECTIVE STATEMENT:  Pt denies any acute changes since last visit.  Pt accompanied by: significant other, wife Veronica  PERTINENT HISTORY: CAD, GERD, HIV, HTN, ischemic  cardiomyopathy, CVA  PAIN:  Are you having pain? No  PRECAUTIONS: Fall and ICD/Pacemaker; L hemi   PATIENT GOALS: to walk and use my hand   OBJECTIVE:  Note: Objective measures were completed at Evaluation unless otherwise noted.  DIAGNOSTIC FINDINGS: 12/01/23 head CT IMPRESSION: 1. Subtle hypoattenuation involving the right caudate and right lentiform nuclei which may reflect acute infarct. 2. No acute intracranial hemorrhage. 3. Remote infarct involving the right frontal operculum, insula, and superior right temporal lobe. 4. Asymmetric density of the right M1 segment. Recommend correlation with CTA. 5. ASPECTS is 8   GAIT: Findings: Gait Characteristics: step to pattern, decreased arm swing- Left, decreased step length- Right, decreased stance time- Left, decreased stride length, decreased hip/knee flexion- Left, decreased ankle dorsiflexion- Left, circumduction- Left, Left hip hike, trunk rotated posterior- Left, trunk flexed, narrow BOS, and poor foot clearance- Left, Assistive device utilized:Hemi walker, and Level of assistance: CGA and Min A                                                                                                             TREATMENT   Self-Care Vitals:   08/15/24 1018  BP: (!) 98/56  Pulse: (!) 50    BP assessed in RUE in sitting at rest, WNL for patient. Pt denies any lightheadedness during  session.   NMR: Gait pattern: decreased hip/knee flexion- Left, decreased ankle dorsiflexion- Left, circumduction- Left, and lateral lean- Right Distance walked: 230 ft to the L around track Assistive device utilized: Quad cane large base Level of assistance: CGA Comments: with L AFO; fatigues during 2nd lap with decreased gait speed, some scissoring with LLE, narrow BOS, decreased step length LLE  In tall kneeling on mat table with bench: +2 to get into tall kneeling position LLE goes into extensor tone so pt needs total A to get limb back onto mat table and in flexed knee position on mat table LUE spasticity, unable to fully extend fingers but able to extend them slightly to grip edge of bench with LUE Mod A provided to prevent LLE from extending off of mat table, to assist with LUE WB on bench, and to maintain weight shift to the L for equal WB through BLE Verbal cues for L lateral weight shift, cues for glute activation for hip extension for upright posture, cues for trunk extension for upright posture Retrieving squigz with RUE Pt has to have squigz placed close to bench as he loses balance without RUE support for an extended period of time and falls forwards Bench removed and pt transitions to sidelying on mat to sitting EOM with mod A  *AFO removed during tall-kneel tasks to avoid limited ROM     PATIENT EDUCATION: Education details: continue HEP Person educated: Patient and Spouse Education method: Explanation, Demonstration, Tactile cues, and Verbal cues Education comprehension: verbalized understanding, returned demonstration, verbal cues required, tactile cues required, and needs further education  HOME EXERCISE PROGRAM: Access Code: [email or web address] URL: [email or web address] Date: 06/06/2024 Prepared by: Delon Pop  Exercises - Supine Bridge  -  1 x daily - 7 x weekly - 3 sets - 10 reps - Supine March with Posterior Pelvic Tilt  - 1 x daily - 7 x weekly -  3 sets - 10 reps - Mini Squat with Counter Support  - 1 x daily - 7 x weekly - 3 sets - 8-10 reps - Standing Knee Flexion with Counter Support  - 1 x daily - 7 x weekly - 3 sets - 6-8 reps - Standing March with Counter Support  - 1 x daily - 7 x weekly - 3 sets - 8-10 reps - Staggered Sit-to-Stand  - 1 x daily - 7 x weekly - 3 sets - 10 reps - Side Stepping with Counter Support  - 1 x daily - 7 x weekly - 3 sets - 10 reps - Mini Squat with Counter Support  - 1 x daily - 7 x weekly - 3 sets - 10 reps  GOALS: Goals reviewed with patient? Yes  SHORT TERM GOALS: Target date: 06/22/24  Pt will be independent with initial HEP for improved functional strength and balance  Baseline: to be provided  Goal status: MET  2.  Pt will improve gait speed to >/= .25m/s to demonstrate improved community ambulation  Baseline: .56m/s with HW + CGA; .71m/s with LBQC + CGA Goal status: MET  3.  Pt will improve TUG to </= 70 secs to demonstrated reduced fall risk  Baseline: 98s with HW + MinA; 79s LBQC + CGA Goal status: IN PROGRESS  4.  Pt will improve 5x STS to </= 22 sec to demo improved functional LE strength and balance   Baseline: 30.81 with CGA + R UE; 24/5s no UE + CGA Goal status: IN PRPOGRESS   LONG TERM GOALS: Target date: 07/13/24 (to match cert date)   Pt will be independent with final HEP for improved functional strength and balance  Baseline: to be provided; provided Goal status: MET  Pt will improve gait speed to >/= .22m/s to demonstrate improved community ambulation  Baseline: .10m/s with HW + CGA; .42m/s LBQC + CGA Goal status: IN PROGRESS  Pt will improve TUG to </= 50 secs to demonstrated reduced fall risk  Baseline: 98s with HW + MinA; 66s LBQC + CGA Goal status: IN PROGRESS  Pt will improve 5x STS to </= 15 sec to demo improved functional LE strength and balance   Baseline: 30.81 with CGA + R UE; 24s R UE + CGA Goal status: IN PROGRESS  NEW SHORT TERM GOALS:        08/10/24  Pt will be compliant with updated HEP for improved functional strength and balance    Baseline: to be updated   Goal status: MET  2. Pt will improve gait speed to >/= .41m/s to demonstrate improved community ambulation Baseline: .51m/s LBQC + CGA, 0.66m/s LBQC and CGA (1/5) Goal status: IN PROGRESS  3. Pt will improve TUG to </= 50 secs to demonstrated reduced fall risk Baseline: 66s LBQC + CGA, 56.91 sec LBQC and CGA (1/5) Goal status: IN PROGRESS  4. Pt will improve 5x STS to </= 20 sec to demo improved functional LE strength and balance  Baseline:  24s R UE + CGA, 21 sec RUE and CGA (1/5) Goal status: IN PROGRESS   NEW LONG TERM GOALS: 09/07/24  Pt will be compliant with updated final HEP for improved functional strength and balance    Baseline: to be updated   Goal status: NEW  2. Pt will  improve gait speed to >/= .49m/s to demonstrate improved community ambulation Baseline: .32m/s with HW + CGA, 0.22m/s LBQC and CGA (1/5) Goal status: REVISED/DOWNGRADED  3. Pt will improve TUG to </= 50 secs to demonstrated reduced fall risk Baseline: 66s LBQC + CGA, 56.91 sec LBQC and CGA (1/5) Goal status: REVISED/DOWNGRADED  4. Pt will improve 5x STS to </= 18 sec to demo improved functional LE strength and balance  Baseline: 24s R UE + CGA, 21 sec RUE and CGA (1/5) Goal status: REVISED/DOWNGRADED   ASSESSMENT:  CLINICAL IMPRESSION: Emphasis of skilled PT session on continuing to work on gait training with [facility] followed by introduction of tall-kneeling tasks with bench on mat table. Pt continues to exhibit above-mentioned gait impairments. He initially struggles to get into tall-kneel position due to LLE extensor tone and spasticity in his LUE. Pt is able to maintain tall kneel position on mat table with bench with mod A as noted above. He does benefit from proximal LE strengthening and WB in this position. He fatigues quickly with this but can benefit from further practice of  this in future PT sessions. He continues to benefit from skilled PT services to work on LLE NMR as well as to work towards increased safety and independence with his functional mobility. Continue POC.    OBJECTIVE IMPAIRMENTS: Abnormal gait, decreased balance, decreased coordination, decreased endurance, decreased knowledge of condition, decreased knowledge of use of DME, decreased ROM, decreased strength, impaired tone, impaired UE functional use, and postural dysfunction.   ACTIVITY LIMITATIONS: carrying, lifting, bending, standing, squatting, stairs, transfers, bed mobility, locomotion level, and caring for others  PARTICIPATION LIMITATIONS: meal prep, cleaning, interpersonal relationship, driving, shopping, community activity, occupation, and yard work  PERSONAL FACTORS: Age, Fitness, Past/current experiences, Time since onset of injury/illness/exacerbation, Transportation, and 3+ comorbidities: see above are also affecting patient's functional outcome.   REHAB POTENTIAL: Fair time since onset  CLINICAL DECISION MAKING: Stable/uncomplicated  EVALUATION COMPLEXITY: Low  PLAN:  PT FREQUENCY: 2x/week  PT DURATION: other: 7 weeks  PLANNED INTERVENTIONS: [id number]- PT Re-evaluation, 97750- Physical Performance Testing, 97110-Therapeutic exercises, 97530- Therapeutic activity, [id number]- Neuromuscular re-education, 97535- Self Care, [id number]- Manual therapy, [id number]- Gait training, [phone]- Orthotic Initial, [phone]- Orthotic/Prosthetic subsequent, [phone]- Aquatic Therapy, [phone] (1-2 muscles), [phone] (3+ muscles)- Dry Needling, Patient/Family education, Balance training, Stair training, Taping, Vestibular training, Visual/preceptual remediation/compensation, DME instructions, and Wheelchair mobility training  PLAN FOR NEXT SESSION: HIGT! Continue working with [facility], continue use of L LE, weight shifting in bars with toe taps, improve L hip flexion strength. Facilitate left weight shifting during ambulation/  literally anything to get more weight on the left leg (modified SLS, R LE step ups so he has to spend more time on L, etc); staggered sit to stands, L hip abd strengthening (resisted dot taps), did they get [facility]?, tall kneel at bench on mat table, resisted LLE forward step    Waddell Southgate, PT Waddell Southgate, PT, DPT, CSRS   08/15/2024, 10:56 AM        "

## 2024-08-19 NOTE — Therapy (Signed)
 " OUTPATIENT OCCUPATIONAL THERAPY NEURO TREATMENT  Patient Name: Richard [name] MRN: [id number] DOB:05-26-1956, 69 y.o., male Today's Date: 08/20/2024  PCP: [name], MD REFERRING PROVIDER: [name], MD  END OF SESSION:  OT End of Session - 08/20/24 0937     Visit Number 9    Number of Visits 15    Date for Recertification  09/08/24    Authorization Type VA approved 15 visits from 06/07/24 - 10/05/24    Authorization - Visit Number 9    Progress Note Due on Visit 10    OT Start Time [phone]    OT Stop Time 1015    OT Time Calculation (min) 38 min    Activity Tolerance Patient tolerated treatment well    Behavior During Therapy [facility] for tasks assessed/performed           Past Medical History:  Diagnosis Date   Coronary artery disease    a. cath 04/02/2014 3v dx 80-90% in D1, 80-90% mid LAD, 95% apical LAD, 95% distal inferior LV branch of LCx, total occlusion of prox RCA  b). Cath 04/03/2014 DES to mid LAD, DES x3 to prox D1, DAPT indefinitely. LCx lesion untreated   GERD (gastroesophageal reflux disease)    HIV (human immunodeficiency virus infection) (HCC)    Hypertension    Ischemic cardiomyopathy 08/09/2013   EF 20-25 percent by echo in 04/2014, s/p [employer] [place] model [id number] serial number [id number]   Stroke [facility])    Past Surgical History:  Procedure Laterality Date   CARDIAC CATHETERIZATION  04/02/2014   DR BURNARD   ESOPHAGOGASTRODUODENOSCOPY (EGD) WITH PROPOFOL  N/A 09/08/2018   Procedure: ESOPHAGOGASTRODUODENOSCOPY (EGD) WITH PROPOFOL ;  Surgeon: Rollin Dover, MD;  Location: [facility] ENDOSCOPY;  Service: Endoscopy;  Laterality: N/A;   IMPLANTABLE CARDIOVERTER DEFIBRILLATOR IMPLANT N/A 09/10/2014   Procedure: IMPLANTABLE CARDIOVERTER DEFIBRILLATOR IMPLANT;  Surgeon: Jerel Balding, MD;  [facility] model [id number] serial number [id number]   IR CT HEAD LTD  12/01/2023   IR PERCUTANEOUS ART THROMBECTOMY/INFUSION INTRACRANIAL INC DIAG ANGIO   12/01/2023   IR US  GUIDE VASC ACCESS RIGHT  12/01/2023   LEFT AND RIGHT HEART CATHETERIZATION WITH CORONARY ANGIOGRAM N/A 04/02/2014   Procedure: LEFT AND RIGHT HEART CATHETERIZATION WITH CORONARY ANGIOGRAM;  Surgeon: Debby DELENA Burnard, MD;  Location: [facility] CATH LAB;  Service: Cardiovascular;  Laterality: N/A;   MEDIAL COLLATERAL LIGAMENT REPAIR, KNEE Right 08/25/2015   Procedure: RIGHT THUMB RADICAL COLLATERAL LIGAMENT REPAIR;  Surgeon: Donnice Robinsons, MD;  Location: [facility] OR;  Service: Orthopedics;  Laterality: Right;   PERCUTANEOUS CORONARY STENT INTERVENTION (PCI-S) N/A 04/03/2014   Procedure: PERCUTANEOUS CORONARY STENT INTERVENTION (PCI-S);  Surgeon: Peter M Jordan, MD;  Location: [facility] CATH LAB;  Service: Cardiovascular;  Laterality: N/A;   RADIOLOGY WITH ANESTHESIA N/A 12/01/2023   Procedure: RADIOLOGY WITH ANESTHESIA;  Surgeon: Radiologist, Medication, MD;  Location: [facility] OR;  Service: Radiology;  Laterality: N/A;  BETHANY BRADFORD DILATION N/A 09/08/2018   Procedure: SAVORY DILATION;  Surgeon: Rollin Dover, MD;  Location: [facility] ENDOSCOPY;  Service: Endoscopy;  Laterality: N/A;   Patient Active Problem List   Diagnosis Date Noted   ICD (implantable cardioverter-defibrillator) discharge 03/23/2024   Hyperlipidemia 01/02/2024   CKD stage 3a, GFR 45-59 ml/min (HCC) 01/02/2024   Depression with anxiety 12/27/2023   Chronic systolic congestive heart failure (HCC) 12/23/2023   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   Acute ischemic right MCA stroke (HCC) 12/01/2023   Schizophrenia, unspecified (HCC) 03/07/2023  Recurrent major depression 03/07/2023   Pseudophakia of left eye 03/07/2023   Acid reflux 03/07/2023   Dyspepsia 03/07/2023   Anxiety state 03/07/2023   Cortical age-related cataract of right eye 03/07/2023   Psychotic disorder (HCC) 03/07/2023   CAD (coronary artery disease) 03/07/2023   Abdominal mass 07/12/2021   Peripheral vascular disease 02/04/2021   Overweight 01/21/2020    Dysphagia 12/04/2018   Hiatal hernia 04/19/2018   Gastroesophageal reflux disease 04/03/2018   Ischemic congestive cardiomyopathy (HCC) 10/17/2017   Generalized anxiety disorder 10/17/2017   Prediabetes 10/16/2017   Acquired thrombocytopenia 10/16/2017   Chronic kidney disease, stage 3 (HCC) 10/16/2017   Primary erectile dysfunction 10/13/2017   Chronic combined systolic and diastolic heart failure (HCC) 12/30/2015   Traumatic rupture of collateral ligament of other finger at metacarpophalangeal and interphalangeal joint, subsequent encounter 10/30/2015   Traumatic rupture of collateral ligament of other finger at metacarpophalangeal and interphalangeal joint, initial encounter 08/12/2015   VT (ventricular tachycardia) (HCC) 12/31/2014   ICD (implantable cardioverter-defibrillator) in place 09/10/2014   At risk for sudden cardiac death 09/04/2014   Systolic heart failure (HCC) 08/09/2014   Coronary artery disease involving native coronary artery of native heart without angina pectoris 04/04/2014   Unstable angina (HCC) 04/03/2014   Cardiomyopathy, ischemic 03/22/2014   History of CVA (cerebrovascular accident) 02/20/2014   Human immunodeficiency virus infection (HCC) 02/20/2014   Essential hypertension 02/20/2014   Hyperlipidemia LDL goal <70 02/20/2014   Depression 02/20/2014   Cerebral infarction (HCC) 08/09/2006    ONSET DATE: 06/19/2024  REFERRING DIAG: I63.9 (ICD-10-CM) - Cerebral infarction, unspecified  Note:  [employer] Auth# [id number] 06/07/24 - 10/05/24 15 OT visits  THERAPY DIAG:  Hemiplegia and hemiparesis following cerebral infarction affecting left non-dominant side (HCC)  Pain in left hand  Abnormal posture  Muscle weakness (generalized)  Unsteadiness on feet  Rationale for Evaluation and Treatment: Rehabilitation  SUBJECTIVE:   SUBJECTIVE STATEMENT: Wife/pt reports that the splint is fitting better after last adjustments  Pt accompanied by:  significant other (wife)   PERTINENT HISTORY: presented 12/01/23 with L-sided weakness and slurred speech. CTA showed occlusion of the right M1 segment, proximal M2 branch of the right MCA with intraluminal thrombus. S/p mechanical thrombectomy 4/24. CT showed subtle hypoattenuation involving the right caudate and right lentiform nuclei which may reflect acute infarct. MRI pending. PMH: CAD s/p CABG x3 2015, ischemic cardiomyopathy s/p ICD placement, HTN, HLD, HIV, GERD, CVA in 2008 without residual deficits  PRECAUTIONS: Fall and ICD/Pacemaker, no driving  WEIGHT BEARING RESTRICTIONS: No  PAIN:  Are you having pain? Lt hand fluctuates 5/10  FALLS: Has patient fallen in last 6 months? Yes. Number of falls 4; slid off sofa, fell off bed, nearly fell off commode into tub    LIVING ENVIRONMENT: Lives with: lives with their spouse Lives in: one level home Stairs: Yes: External: 1 steps; on right going up Has following equipment at home: Hemi walker, Wheelchair (manual), Shower bench, bed side commode, and Grab bars   PLOF: Independent  PATIENT GOALS: get out of this wheelchair  OBJECTIVE:  Note: Objective measures were completed at Evaluation unless otherwise noted.  HAND DOMINANCE: Right  ADLs:  Eating: assist to cut food Grooming: set up for brushing gums and implants, wife brushes dentures and shaves him (wife always shaved pt even prior to CVA) UB Dressing: mod to max assist LB Dressing: max assist Toileting: mod I  Bathing: mod assist - mostly seated Tub Shower transfers: min assist for leg management  on Lt  Equipment: Transfer tub bench, Grab bars, and hand held shower  IADLs: dependent for all IADLS Handwriting: No changes  MOBILITY STATUS: uses w/c mostly, uses hemi walker some at home   UPPER EXTREMITY ROM:  RUE AROM WNL's  LUE AROM dominated by synergy pattern (goes into some sh abduction, elbow flex, pronation but no isolated movement) No active movement in  hand Pt can only tolerate approx 75% passive shoulder flexion   UPPER EXTREMITY MMT:   NT d/t spasticity   HAND FUNCTION: No functional use LT hand  COORDINATION: No functional use Lt hand  SENSATION: WFL  EDEMA: mild Lt hand  MUSCLE TONE: LUE: Moderate, Hypertonic, and Modifed Ashworth Scale 3 = Considerable increase in muscle tone, passive movement difficult at elbow, 2/4 wrist and fingers  COGNITION: Overall cognitive status: Impaired, decreased memory Wife reports he sometimes doesn't turn the sink off after washing hands  VISION: Subjective report: about the same to me. Got new glasses Baseline vision: Wears glasses all the time but not wearing at evaluation Visual history: cataract sx OU  VISION ASSESSMENT: Not tested    PERCEPTION: Not tested  PRAXIS: Not tested  OBSERVATIONS: dysarthria of speech, reports getting choked especially on water and saliva                                                                                                                           TREATMENT:  08/21/23:  Began checking goals and discussing progress.--see goal section below.  Per pt/wife report Able to doff clothes with supervision.   Pt able to perform UB dressing with min A and LB dressing with assist for L shoe/brace and min A for donning pants.  Wife reports pt is bathing with min-mod A.   Practiced simulated donning/doffing pants with theraband loop and pt able to don/doff with close supervision.  Pt shown sock aide as option but reports that he is able to don L sock by crossing legs with difficulty.  Wife alsoreports that she is going to order step stool to assist with LB dressing.   Reviewed HEP.  Pt returned demo each with min cueing.   PATIENT EDUCATION: Education details: Reviewed HEP; see above Person educated: Patient and Spouse Education method: Explanation, Demonstration, and Verbal cues Education comprehension: verbalized understanding and returned  demonstration  HOME EXERCISE PROGRAM: 07/09/24: Initial LUE HEP  07/24/24: memory compensation strategies   GOALS: Goals reviewed with patient? Yes  SHORT TERM GOALS: Target date: 08/09/24  Independent with HEP for LUE Baseline: Goal status: 08/20/24:  Met.  2.  Independent with splint wear and care for Lt hand/wrist (resting hand splint) Baseline:  Goal status: 08/20/24:  partially met--will continue to monitor for fit, but improved ability to wear since last adjustment.  3.  Pt/family to verbalize understanding with hemi dressing/bathing techniques and A/E to increase independence with ADLS Baseline:  Goal status: IN PROGRESS 08/21/23  4.  Pt to verbalize understanding with memory  compensation strategies  Baseline:  Goal status: IN PROGRESS   LONG TERM GOALS: Target date: 09/08/24  Independent with updated HEP  Baseline:  Goal status: INITIAL  2.  Pt to consistently perform dressing and bathing with no more than min assist Baseline:  Goal status: INITIAL  3.  Pt to use LUE as stabilizer at least 25% of the time for ADLS/bilateral tasks Baseline:  Goal status: INITIAL  4.  Pt to cut food with A/E (rocker knife)  Baseline:  Goal status: INITIAL  5.  Pt to id 3 pain management strategies for Lt hand I'ly Baseline:  Goal status: INITIAL   ASSESSMENT:  CLINICAL IMPRESSION: Pt is progressing towards goals with improved ADL performance per pt/wife, improved ability to wear splint, and able to return demo/verbalize understanding of HEP.  PERFORMANCE DEFICITS: in functional skills including ADLs, IADLs, coordination, edema, tone, ROM, strength, pain, Fine motor control, Gross motor control, mobility, body mechanics, decreased knowledge of precautions, decreased knowledge of use of DME, and UE functional use, cognitive skills including memory.   IMPAIRMENTS: are limiting patient from ADLs, IADLs, rest and sleep, leisure, and social participation.   CO-MORBIDITIES: may  have co-morbidities  that affects occupational performance. Patient will benefit from skilled OT to address above impairments and improve overall function.  REHAB POTENTIAL: Good  PLAN:  OT FREQUENCY: 2x/week  OT DURATION: 8 weeks  PLANNED INTERVENTIONS: 97535 self care/ADL training, 02889 therapeutic exercise, 97530 therapeutic activity, 97112 neuromuscular re-education, 97140 manual therapy, 97113 aquatic therapy, 97018 paraffin, 02960 fluidotherapy, 97010 moist heat, 97010 cryotherapy, 97760 Orthotic Initial, 97763 Orthotic/Prosthetic subsequent, passive range of motion, functional mobility training, visual/perceptual remediation/compensation, energy conservation, patient/family education, and DME and/or AE instructions  RECOMMENDED OTHER SERVICES: none at this time  CONSULTED AND AGREED WITH PLAN OF CARE: Patient and family member/caregiver  PLAN FOR NEXT SESSION:   Review memory compensation strategies/check STG; ADLS, NMR, UBE  *NO estim d/t defibrillator   Sebastian Dzik, OT 08/20/2024, 1:04 PM           "

## 2024-08-20 ENCOUNTER — Encounter: Payer: Self-pay | Admitting: Speech Pathology

## 2024-08-20 ENCOUNTER — Ambulatory Visit: Admitting: Speech Pathology

## 2024-08-20 ENCOUNTER — Ambulatory Visit: Admitting: Occupational Therapy

## 2024-08-20 ENCOUNTER — Ambulatory Visit: Admitting: Physical Therapy

## 2024-08-20 ENCOUNTER — Encounter: Payer: Self-pay | Admitting: Occupational Therapy

## 2024-08-20 VITALS — BP 101/61 | HR 55

## 2024-08-20 DIAGNOSIS — I69354 Hemiplegia and hemiparesis following cerebral infarction affecting left non-dominant side: Secondary | ICD-10-CM

## 2024-08-20 DIAGNOSIS — R293 Abnormal posture: Secondary | ICD-10-CM

## 2024-08-20 DIAGNOSIS — R2689 Other abnormalities of gait and mobility: Secondary | ICD-10-CM

## 2024-08-20 DIAGNOSIS — M79642 Pain in left hand: Secondary | ICD-10-CM

## 2024-08-20 DIAGNOSIS — R2681 Unsteadiness on feet: Secondary | ICD-10-CM

## 2024-08-20 DIAGNOSIS — M6281 Muscle weakness (generalized): Secondary | ICD-10-CM

## 2024-08-20 DIAGNOSIS — R262 Difficulty in walking, not elsewhere classified: Secondary | ICD-10-CM

## 2024-08-20 DIAGNOSIS — R471 Dysarthria and anarthria: Secondary | ICD-10-CM

## 2024-08-20 DIAGNOSIS — R1310 Dysphagia, unspecified: Secondary | ICD-10-CM

## 2024-08-20 DIAGNOSIS — R41841 Cognitive communication deficit: Secondary | ICD-10-CM

## 2024-08-20 NOTE — Therapy (Signed)
 " OUTPATIENT SPEECH LANGUAGE PATHOLOGY TREATMENT   Patient Name: Richard [name] MRN: [id number] DOB:June 08, 1956, 69 y.o., male Today's Date: 08/20/2024  PCP: [name], MD REFERRING PROVIDER: [name], MD  END OF SESSION:  End of Session - 08/20/24 0851     Visit Number 7    Number of Visits 25    Date for Recertification  10/03/24    SLP Start Time 0847    SLP Stop Time  0930    SLP Time Calculation (min) 43 min    Activity Tolerance Patient tolerated treatment well              Past Medical History:  Diagnosis Date   Coronary artery disease    a. cath 04/02/2014 3v dx 80-90% in D1, 80-90% mid LAD, 95% apical LAD, 95% distal inferior LV branch of LCx, total occlusion of prox RCA  b). Cath 04/03/2014 DES to mid LAD, DES x3 to prox D1, DAPT indefinitely. LCx lesion untreated   GERD (gastroesophageal reflux disease)    HIV (human immunodeficiency virus infection) (HCC)    Hypertension    Ischemic cardiomyopathy 08/09/2013   EF 20-25 percent by echo in 04/2014, s/p [employer] [place] model [id number] serial number [id number]   Stroke [facility])    Past Surgical History:  Procedure Laterality Date   CARDIAC CATHETERIZATION  04/02/2014   DR BURNARD   ESOPHAGOGASTRODUODENOSCOPY (EGD) WITH PROPOFOL  N/A 09/08/2018   Procedure: ESOPHAGOGASTRODUODENOSCOPY (EGD) WITH PROPOFOL ;  Surgeon: Rollin Dover, MD;  Location: [facility] ENDOSCOPY;  Service: Endoscopy;  Laterality: N/A;   IMPLANTABLE CARDIOVERTER DEFIBRILLATOR IMPLANT N/A 09/10/2014   Procedure: IMPLANTABLE CARDIOVERTER DEFIBRILLATOR IMPLANT;  Surgeon: Jerel Balding, MD;  [facility] [place] model [id number] serial number [id number]   IR CT HEAD LTD  12/01/2023   IR PERCUTANEOUS ART THROMBECTOMY/INFUSION INTRACRANIAL INC DIAG ANGIO  12/01/2023   IR US  GUIDE VASC ACCESS RIGHT  12/01/2023   LEFT AND RIGHT HEART CATHETERIZATION WITH CORONARY ANGIOGRAM N/A 04/02/2014   Procedure: LEFT AND RIGHT HEART CATHETERIZATION WITH CORONARY  ANGIOGRAM;  Surgeon: Debby DELENA Burnard, MD;  Location: [facility] CATH LAB;  Service: Cardiovascular;  Laterality: N/A;   MEDIAL COLLATERAL LIGAMENT REPAIR, KNEE Right 08/25/2015   Procedure: RIGHT THUMB RADICAL COLLATERAL LIGAMENT REPAIR;  Surgeon: Donnice Robinsons, MD;  Location: [facility] OR;  Service: Orthopedics;  Laterality: Right;   PERCUTANEOUS CORONARY STENT INTERVENTION (PCI-S) N/A 04/03/2014   Procedure: PERCUTANEOUS CORONARY STENT INTERVENTION (PCI-S);  Surgeon: Peter M Jordan, MD;  Location: [facility] CATH LAB;  Service: Cardiovascular;  Laterality: N/A;   RADIOLOGY WITH ANESTHESIA N/A 12/01/2023   Procedure: RADIOLOGY WITH ANESTHESIA;  Surgeon: Radiologist, Medication, MD;  Location: [facility] OR;  Service: Radiology;  Laterality: N/A;  BETHANY BRADFORD DILATION N/A 09/08/2018   Procedure: SAVORY DILATION;  Surgeon: Rollin Dover, MD;  Location: [facility] ENDOSCOPY;  Service: Endoscopy;  Laterality: N/A;   Patient Active Problem List   Diagnosis Date Noted   ICD (implantable cardioverter-defibrillator) discharge 03/23/2024   Hyperlipidemia 01/02/2024   CKD stage 3a, GFR 45-59 ml/min (HCC) 01/02/2024   Depression with anxiety 12/27/2023   Chronic systolic congestive heart failure (HCC) 12/23/2023   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   Acute ischemic right MCA stroke (HCC) 12/01/2023   Schizophrenia, unspecified (HCC) 03/07/2023   Recurrent major depression 03/07/2023   Pseudophakia of left eye 03/07/2023   Acid reflux 03/07/2023   Dyspepsia 03/07/2023   Anxiety state 03/07/2023   Cortical age-related cataract of right eye 03/07/2023  Psychotic disorder (HCC) 03/07/2023   CAD (coronary artery disease) 03/07/2023   Abdominal mass 07/12/2021   Peripheral vascular disease 02/04/2021   Overweight 01/21/2020   Dysphagia 12/04/2018   Hiatal hernia 04/19/2018   Gastroesophageal reflux disease 04/03/2018   Ischemic congestive cardiomyopathy (HCC) 10/17/2017   Generalized anxiety disorder 10/17/2017    Prediabetes 10/16/2017   Acquired thrombocytopenia 10/16/2017   Chronic kidney disease, stage 3 (HCC) 10/16/2017   Primary erectile dysfunction 10/13/2017   Chronic combined systolic and diastolic heart failure (HCC) 12/30/2015   Traumatic rupture of collateral ligament of other finger at metacarpophalangeal and interphalangeal joint, subsequent encounter 10/30/2015   Traumatic rupture of collateral ligament of other finger at metacarpophalangeal and interphalangeal joint, initial encounter 08/12/2015   VT (ventricular tachycardia) (HCC) 12/31/2014   ICD (implantable cardioverter-defibrillator) in place 09/10/2014   At risk for sudden cardiac death Sep 17, 2014   Systolic heart failure (HCC) 08/09/2014   Coronary artery disease involving native coronary artery of native heart without angina pectoris 04/04/2014   Unstable angina (HCC) 04/03/2014   Cardiomyopathy, ischemic 03/22/2014   History of CVA (cerebrovascular accident) 02/20/2014   Human immunodeficiency virus infection (HCC) 02/20/2014   Essential hypertension 02/20/2014   Hyperlipidemia LDL goal <70 02/20/2014   Depression 02/20/2014   Cerebral infarction (HCC) 08/09/2006    ONSET DATE: 11/2023  REFERRING DIAG: I63.9 (ICD-10-CM) - Cerebral infarction, unspecified  THERAPY DIAG:  Cognitive communication deficit  Dysphagia, unspecified type  Dysarthria and anarthria  Rationale for Evaluation and Treatment: Rehabilitation  SUBJECTIVE:   SUBJECT.I got choked this morning Pt accompanied by: self and significant other  PERTINENT HISTORY: Pt is a 69 y.o. male who has a recent hx of CVA back in April, 2025. Pt noted residual deficits include dysarthria, cognitive-communication deficits and dysphagia. Pt's wife was present and provided additional hx. Pt's wife notices that pt's speech is low in volume during conversations. Pt recently received HH ST, primarily targeting dysarthria at sentence and conversation levels. Pt also  received ST in CIR 11/2023-12/2023, targeting swallowing, dysarthria, and cognition. Pt currently consumes regular/thin-liquid diet. Pt endorses coughing and choking on secretions on occasion. Pt's wife handles household responsibilities (finances, medication management, etc.). Pt's wife states that pt tends to lose focus during longer tasks. Pt enjoys watching sports at home. PMHx: CAD, GERD, HIV, HTN, ischemic cardiomyopathy, CVA.  PAIN:  Are you having pain? No  FALLS: Has patient fallen in last 6 months?  See PT evaluation for details  LIVING ENVIRONMENT: Lives with: lives with their family and lives with their spouse Lives in: House/apartment  PLOF:  Level of assistance: Comment: Independent with driving and medication management; wife still did finances  Employment: On disability  PATIENT GOALS: To improve thinking skills, swallowing, and speech.  OBJECTIVE:  Note: Objective measures were completed at Evaluation unless otherwise noted.  DIAGNOSTIC FINDINGS: MBSS from 12/08/23 Patient presents with mild oropharyngeal dysphagia. Oral phase is characterized by reduced lingual movement and labial seal resulting in anterior and posterior spillage. Patient independently utilized multiple swallows and digital sweep when necessary to clear oral residuals. Pharyngeal phase is remarkable for reduced epiglottic inversion and pharyngeal stripping wave resulting in mild pharyngeal residuals, mainly present in the vallecula and pyriform sinuses. Penetration present x2 with thin liquids (x1 via straw, x1 during liquid wash), though likely due to premature spillage and mistiming of epiglottic inversion and laryngeal vestibular closure. Pill consumed in puree, requiring additional bites of PO due to suspected esophageal retention. Recommend continuation of D2/thin liquid diet  with small, single sips of liquids. Patient should continue to utilize digital sweep to clear buccal pocketing of solids. Recommend  medications administered whole in puree with additional bites to promote esophogeal clearance.     COGNITION: Overall cognitive status: Impaired Areas of impairment:  Attention: Impaired: Selective Memory: Impaired: Immediate Working Short term Psychologist, Educational function: Impaired: Impulse control, Organization, Planning, and Error awareness Behavior: Impulsive Functional deficits: Medication management, time management.     MOTOR SPEECH: Overall motor speech: impaired Level of impairment: Conversation Respiration: thoracic breathing and diaphragmatic/abdominal breathing Phonation: normal and low vocal intensity Resonance: WFL Articulation: Impaired: conversation Intelligibility: Intelligibility reduced Motor planning: Appears intact Motor speech errors: aware and consistent Interfering components: N/A Effective technique: increased vocal intensity and pacing  ORAL MOTOR EXAMINATION: Overall status: Impaired:   Lingual: Left (Symmetry and Strength) Comments: N/A   STANDARDIZED ASSESSMENTS: SLUMS: 20/30  PATIENT REPORTED OUTCOME MEASURES (PROM): EAT-10: 20/36 and Communication Effectiveness Survey: 11/24                                                                                                                            TREATMENT DATE:   08/20/24: Dysphagia: Pt reports inconsistent practice since last visit. Today he completed Masako and effortful swallows 10x wit hmod I. Isometric lingual strengthening with resistance completed 10x each medial and R/L lateral positions with 3 second hold each. Provided tongue depressor to complete at home, however also educated them that the can use a spoon or hand as well. Ongoing education to swallow more frequently, especially when laying down to reduce choking on saliva. Instructed them to request referral to ENT for throat discomfort and dysphagia.   Cognition/Dysarthria: Spouse has placed sign to help Trung remember to turn off the  water in the bathroom. He has not forgotten since the sign has been placed. They have not posted a new calendar for January as of yet - will work on this this week. Targeted recall of meds and use of pill organizer. Their pill organizer is large for 4x daily. With verbal cues, Branko identified and opened days and times with rare min A. He will need to be able to turn box over to retrieve pills - Spouse generated strategy of getting 2 pill boxes, 1 for day and 1 for night as he only takes meds twice a day the 4x a day pill box is harder to manage.   08/16/23: Dysphagia: Pt has been practicing HEP regularly at home. Today he completed pharyngeal strengthening exercises for optimizing pt's swallow safety and efficiency. Effortful swallows and Masako were completed with 100% consistency given rare min A. Pt completed effortful swallows 20x and Masako 5x. SLP challenged pt to complete lingual strengthening (isometric) exercises in the medial and right/left lateral positions. Pt performed medial and lateral lingual exercises with 100% consistency and completed 8 reps/5x for each position.  Plan is to continue pharyngeal/lingual swallow exercises.  Cognition: Pt enters with some carryover of dysarthria strategies (over-articulation  and increased volume) requiring occasional min A during structured tasks. SLP introduced memory compensations for optimizing pt's short-term memory recall at home. Pt notes that he forgets to turn the sink and lights off at home. SLP educated pt and pt's wife in using visual cues for improving pt's short-term memory recall and attention to objects in his environment. Pt states that he has forgotten his walker in the bathroom twice recently. SLP encouraged pt to stop, look at his surroundings before proceeding to leave the bathroom to support attention for remembering his walker to optimize safety awareness. SLP inquired pt about his current use of a calendar and other external memory  supports at home. Pt's wife notes that they have a new 2026 calendar that has not been put up yet. SLP educated pt in using external memory supports for increasing pt independence with his short-term memory recall, reducing caregiver burden. SLP provided pt a temporary January, 2026 calendar and had pt identify the current date and an upcoming date for pt's favorite sports teams. Pt was successful with identifying the current date but required min verbal A to identify an upcoming sports game due to pt's selective attention/slight impulsivity. Pt is challenged to use calendar at home to write down pertinent appointments/information for optimizing short-term memory recall. SLP provided pt memory compensations sheet to utilize for incorporating more short-term memory strategies at home. Pt and pt's wife are agreeable to trying new memory compensations. Plan is to continue dysarthria tx and building cognitive-communication support systems. Pt was asked to bring 2 of his current medications to practice medication management in upcoming session.   08/14/23: Dysphagia: Pt has been practicing HEP regularly at home. Today he completed pharyngeal strengthening exercises for optimizing pt's swallow safety and efficiency. Effortful swallows and Masako were completed with 100% consistency given rare min A. Pt completed effortful swallows 20x and Masako 10x. Pt notes that his throat feels more irritated after performing pharyngeal strengthening exercises. Pt's wife states that their doctor wanted to inform SLP about getting an Endoscopy exam performed for checking throat health/swallowing. SLP educated family about importance of objective instrumental testing for examining throat health and swallow function. Plan is to continue pharyngeal swallow exercises and SLP to f/u with pt's PCP about pursuing an objective examination of swallowing, specifically FEES.   Dysarthria: Pt enters with some carryover of dysarthria  strategies (over-articulation and increased volume). Pt engaged in structured description task, where he was required to use dysarthria strategies for responses. Pt utilized dysarthria strategies suring this task in 90% of opportunities given occasional min A. SLP engaged pt in structured conversation, while utilizing dual-task training for challenging pt's selective attention to using dysarthria strategies. Pt used dysarthria strategies with 85% consistency during structured conversation given occasional min A. Pt displayed adequate use of dysarthria strategies during dual-task challenge. Pt is making progress with speech goals. Plan is to continue structured conversation exercises for generalizing dysarthria strategies into conversation-level speech production.   07/30/24: Dysphagia: Pt has been practicing HEP regularly at home. Today he completed lingual resistance exercises and pharyngeal strengthening exercises for optimizing pt's swallow safety and efficiency. Lingual medialization with resistance completed 10/10x with occasional min modeling and verbal cues. Lingual elevation and lateralization with resistance were each completed 10/10x with occasional min A for modeling. Effortful swallows and Masako each completed 10/10x with occasional min A with breaks given every 3 reps of Masako. Trained in strategy of educating family not to talk to you when you are eating over the holidays,  as well as using intent to chew and swallow even with guests or group meals. Spouse verbalizes she will cue him for this as well. Dysarthria: Bosten enters without carryover of strategies for intelligible speech. Reviewed these, targeted carryover repeating and generating sentences with 4-5 syllable words - Durand required usual mod verbal cues and modeling initially for over articulation and saying each syllable. In structured description task, Eleuterio carried over slow rate, volume and over articulation 8/10 phrases with  occasional min A. Initiated training in speaking and swallowing (and walking) with intent - Sakari and his spouse verbalized understanding. Spouse gave an example of when she interrupted Mavryk as he was getting up from the table and he had a near fall as he lost attention and intent when standing.    07/24/24: Dysphagia: Pt has been practicing HEP regularly at home. SLP reviewed lingual resistance exercises and pharyngeal strengthening exercises for optimizing pt's swallow safety and efficiency. Lingual medialization with resistance completed 10/10x with occasional min modeling and verbal cues. Lingual elevation and lateralization with resistance were each completed 5/5x with occasional min A for modeling. Effortful swallows and Masako each completed 10/10x with occasional min A. Pt notes that he continues to experiencing increased coughing with saliva at night-time. SLP educated pt about practicing swallow HEP at night before bed to improve secretion management for saliva and reduce coughing. Pt is agreeable andwill try practicing HEP at night. Plan is to continue lingual and pharyngeal strengthening exercises for optimizing swallow safety and efficiency for QOL. Dysarthria: Targeted compensations for dysarthria (over-articulation, increased loudness) in sentence readings. SLP encouraged pt to utilize swallows before speaking to reduce choking on saliva during speech. Pt utilize a swallow prior to speaking in 90% of opportunities given occasional min verbal A. During 5-7 word sentence readings, pt utilized dysarthria compensations in 90% of opportunities given occasional min verbal A. SLP challenged pt to utilize dysarthria compensations during relevant phrases (personal information). Pt continued to utilize compensations with 90% consistency given occasional min A. Pt is making progress with speech and swallow goals. SLP provided pt homework focused on /s/ sound production in multi-syllabic words and  functional phrases; SLP encouraged pt to continue utilizing over-articulation during HEP for maximizing speech intelligibility. Plan is to continue sentence readings and functional phrase readings for improving pt use of dysarthria compensations.   07/16/24: Dysphagia: Initiated HEP for dysphagia and lingual weakness. See Patient Instructions. Lingual elevation and lateralization with resistance exercises completed 15/15x with occasional min modeling and verbal cues. Effortful swallows and Masako completed 10/10x with occasional min A. Targeted more frequent swallowing and swallowing prior to speaking to reduce choking on saliva. With usual verbal cues, Praneeth demonstrated swallow prior to speaking on structured speech task. He required frequent cues to swallow prior to speaking in conversation. Introduced concept of swallowing with intent, paying attention when eating and drinking  Dysarthria: Targeted compensations for dysarthria in structured speech task generating 3 sentence descriptions - Volume remained WNL with rare min A. Slow rate, over articulation and separating syllables required occasional min modeling and verbal cues. Hart required cueing to ID and correct slurred/unintelligible utterances 3/3x initially. Targeted 3 syllable words - using separate syllables at slow rate to compensate for dysarthria generating sentences with these words -After initial modeling and instruction, Haaris was intelligible in sentences with 3 syllable words 12/12x. Generated list of personally relevant phrases to practice including biographical information he uses with doctors' offices, and most frequent meal orders. At the end of session, Graysin Id'd  and self correct imprecise speech in conversation with spouse with mod I.  07/11/24: Evaluation completed. No tx was conducted on this date.    PATIENT EDUCATION: Education details: Evaluation results/goal setting. Person educated: Patient and Spouse Education  method: Explanation Education comprehension: verbalized understanding   GOALS: Goals reviewed with patient? Yes  SHORT TERM GOALS: Target date: 08/15/2024  Pt will achieve average >70 dB during 8 minute conversation given occasional min A. Baseline: Goal status: ONGOING  2.  Pt will perform pharyngeal and lingual strengthening exercises with 90% consistency when given rare min A. Baseline:  Goal status: ONGOING  3.  Pt and pt's family will utilize at least 1 external memory strategy for maximizing attention during medication management given occasional min A.  Baseline:  Goal status: ONGOING  4.  Pt will display use of aspiration precautions during PO trials of regular/thin-liquids in 90% of opportunities given rare min A Baseline:  Goal status: ONGOING  5.  Pt will achieve 95-97% speech intelligibility during 8 minute conversation with familiar listeners given occasional min A.  Baseline:  Goal status: ONGOING   LONG TERM GOALS: Target date: 10/03/2024  Pt will achieve average >70 dB during a 12 minute conversation given rare min A.  Baseline:  Goal status: ONGOING  2.  Pt will reduce score on EAT-10 PROM, indicating improved self-perception of swallowing function.  Baseline: 20/36 Goal status: ONGOING  3.  Pt and pt's family will describe and demonstrate at least 2 memory strategies for optimizing pt independence with certain ADL tasks (medication management) given rare min A. Baseline:  Goal status: ONGOING  4.  Pt will improve score on Communicative Effectiveness Survey, indicating speech is optimal for successful communication in different contexts.  Baseline: 11/24 Goal status: ONGOING   ASSESSMENT:  CLINICAL IMPRESSION: Patient is a 69 y.o. male who was seen today for evaluation of motor speech, dysphagia, and cognition. Pt presents with mild-moderate dysarthria characterized by consistent, imprecise articulation and reduced vocal loudness at the  conversational level. Pt also presents with mild-moderate cognitive communication deficits primarily characterized by short-term memory impairment, executive function deficits, and impulsive/inattentive behavior. Pt has hx of oropharyngeal dysphagia and currently still experience difficulty with swallowing. According to EAT-10, pt notes that he experiences difficulty with both solids and liquids during swallowing. Recommend ST targeting conversation-level dysarthria tx and cognitive-communication tx focused on establishing external memory aids for optimizing pt communication effectiveness, safety, and independence. In addition, recommend swallow tx focused on pharyngeal strengthening exercises and aspiration precaution strategies to improve pt perception of swallowing difficulties.   OBJECTIVE IMPAIRMENTS: include attention, memory, executive functioning, dysarthria, and dysphagia. These impairments are limiting patient from managing medications, ADLs/IADLs, effectively communicating at home and in community, and safety when swallowing. Factors affecting potential to achieve goals and functional outcome are N/A. Patient will benefit from skilled SLP services to address above impairments and improve overall function.  REHAB POTENTIAL: Good  PLAN:  SLP FREQUENCY: 2x/week  SLP DURATION: 12 weeks  PLANNED INTERVENTIONS: Aspiration precaution training, Pharyngeal strengthening exercises, Internal/external aids, Functional tasks, SLP instruction and feedback, Patient/family education, [phone] Treatment of speech (30 or 45 min) , and [id number] Treatment of swallowing function   Leita Hoehn Ms, CCC-SLP 08/20/2024, 8:51 AM      "

## 2024-08-20 NOTE — Patient Instructions (Addendum)
" ° °  Call Dr. Roanna and request an referral for ENT Dr. Eldora Blanch with [facility] [phone]  Start some folding of laundry to help out  This week, get calendar and add your appointments   Start using pill organizer - cue Jorge initially to help him remember his medications  Great job using a sign to help you remember to turn off the water "

## 2024-08-20 NOTE — Therapy (Addendum)
 " OUTPATIENT PHYSICAL THERAPY NEURO TREATMENT   Patient Name: ZEBULEN SIMONIS MRN: [id number] DOB:Jan 11, 1956, 69 y.o., male Today's Date: 08/20/2024  PCP: Ezekiel Charleston, MD REFERRING PROVIDER: Wynne Flatten, MD    END OF SESSION:  PT End of Session - 08/20/24 1019     Visit Number 21    Number of Visits 27   re-cert   Date for Recertification  98/69/73   re-cert; due to potential delay in scheduling   Authorization Type [facility]    Authorization Time Period 08/01/24-11/29/24    Authorization - Visit Number 6    Authorization - Number of Visits 15    Progress Note Due on Visit 20    PT Start Time 1020   from OT session   PT Stop Time 1100    PT Time Calculation (min) 40 min    Equipment Utilized During Treatment Gait belt    Activity Tolerance Patient tolerated treatment well    Behavior During Therapy WFL for tasks assessed/performed                 Past Medical History:  Diagnosis Date   Coronary artery disease    a. cath 04/02/2014 3v dx 80-90% in D1, 80-90% mid LAD, 95% apical LAD, 95% distal inferior LV branch of LCx, total occlusion of prox RCA  b). Cath 04/03/2014 DES to mid LAD, DES x3 to prox D1, DAPT indefinitely. LCx lesion untreated   GERD (gastroesophageal reflux disease)    HIV (human immunodeficiency virus infection) (HCC)    Hypertension    Ischemic cardiomyopathy 08/09/2013   EF 20-25 percent by echo in 04/2014, s/p [employer] [place] model [id number] serial number [id number]   Stroke [facility])    Past Surgical History:  Procedure Laterality Date   CARDIAC CATHETERIZATION  04/02/2014   DR BURNARD   ESOPHAGOGASTRODUODENOSCOPY (EGD) WITH PROPOFOL  N/A 09/08/2018   Procedure: ESOPHAGOGASTRODUODENOSCOPY (EGD) WITH PROPOFOL ;  Surgeon: Rollin Dover, MD;  Location: [facility] ENDOSCOPY;  Service: Endoscopy;  Laterality: N/A;   IMPLANTABLE CARDIOVERTER DEFIBRILLATOR IMPLANT N/A 09/10/2014   Procedure: IMPLANTABLE CARDIOVERTER DEFIBRILLATOR IMPLANT;  Surgeon: Jerel Balding, MD;  [facility] [place] model [id number] serial number [id number]   IR CT HEAD LTD  12/01/2023   IR PERCUTANEOUS ART THROMBECTOMY/INFUSION INTRACRANIAL INC DIAG ANGIO  12/01/2023   IR US  GUIDE VASC ACCESS RIGHT  12/01/2023   LEFT AND RIGHT HEART CATHETERIZATION WITH CORONARY ANGIOGRAM N/A 04/02/2014   Procedure: LEFT AND RIGHT HEART CATHETERIZATION WITH CORONARY ANGIOGRAM;  Surgeon: Debby DELENA Burnard, MD;  Location: [facility] CATH LAB;  Service: Cardiovascular;  Laterality: N/A;   MEDIAL COLLATERAL LIGAMENT REPAIR, KNEE Right 08/25/2015   Procedure: RIGHT THUMB RADICAL COLLATERAL LIGAMENT REPAIR;  Surgeon: Donnice Robinsons, MD;  Location: [facility] OR;  Service: Orthopedics;  Laterality: Right;   PERCUTANEOUS CORONARY STENT INTERVENTION (PCI-S) N/A 04/03/2014   Procedure: PERCUTANEOUS CORONARY STENT INTERVENTION (PCI-S);  Surgeon: Peter M Jordan, MD;  Location: [facility] CATH LAB;  Service: Cardiovascular;  Laterality: N/A;   RADIOLOGY WITH ANESTHESIA N/A 12/01/2023   Procedure: RADIOLOGY WITH ANESTHESIA;  Surgeon: Radiologist, Medication, MD;  Location: [facility] OR;  Service: Radiology;  Laterality: N/A;  BETHANY BRADFORD DILATION N/A 09/08/2018   Procedure: SAVORY DILATION;  Surgeon: Rollin Dover, MD;  Location: [facility] ENDOSCOPY;  Service: Endoscopy;  Laterality: N/A;   Patient Active Problem List   Diagnosis Date Noted   ICD (implantable cardioverter-defibrillator) discharge 03/23/2024   Hyperlipidemia 01/02/2024   CKD stage 3a, GFR 45-59 ml/min (HCC) 01/02/2024  Depression with anxiety 12/27/2023   Chronic systolic congestive heart failure (HCC) 12/23/2023   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   Acute ischemic right MCA stroke (HCC) 12/01/2023   Schizophrenia, unspecified (HCC) 03/07/2023   Recurrent major depression 03/07/2023   Pseudophakia of left eye 03/07/2023   Acid reflux 03/07/2023   Dyspepsia 03/07/2023   Anxiety state 03/07/2023   Cortical age-related cataract of right eye  03/07/2023   Psychotic disorder (HCC) 03/07/2023   CAD (coronary artery disease) 03/07/2023   Abdominal mass 07/12/2021   Peripheral vascular disease 02/04/2021   Overweight 01/21/2020   Dysphagia 12/04/2018   Hiatal hernia 04/19/2018   Gastroesophageal reflux disease 04/03/2018   Ischemic congestive cardiomyopathy (HCC) 10/17/2017   Generalized anxiety disorder 10/17/2017   Prediabetes 10/16/2017   Acquired thrombocytopenia 10/16/2017   Chronic kidney disease, stage 3 (HCC) 10/16/2017   Primary erectile dysfunction 10/13/2017   Chronic combined systolic and diastolic heart failure (HCC) 12/30/2015   Traumatic rupture of collateral ligament of other finger at metacarpophalangeal and interphalangeal joint, subsequent encounter 10/30/2015   Traumatic rupture of collateral ligament of other finger at metacarpophalangeal and interphalangeal joint, initial encounter 08/12/2015   VT (ventricular tachycardia) (HCC) 12/31/2014   ICD (implantable cardioverter-defibrillator) in place 09/10/2014   At risk for sudden cardiac death 09/17/14   Systolic heart failure (HCC) 08/09/2014   Coronary artery disease involving native coronary artery of native heart without angina pectoris 04/04/2014   Unstable angina (HCC) 04/03/2014   Cardiomyopathy, ischemic 03/22/2014   History of CVA (cerebrovascular accident) 02/20/2014   Human immunodeficiency virus infection (HCC) 02/20/2014   Essential hypertension 02/20/2014   Hyperlipidemia LDL goal <70 02/20/2014   Depression 02/20/2014   Cerebral infarction (HCC) 08/09/2006    ONSET DATE: 05/15/24 referral    REFERRING DIAG: I63.9 (ICD-10-CM) - Cerebral infarction, unspecified   THERAPY DIAG:  Hemiplegia and hemiparesis following cerebral infarction affecting left non-dominant side (HCC)  Abnormal posture  Muscle weakness (generalized)  Unsteadiness on feet  Difficulty in walking, not elsewhere classified  Other abnormalities of gait and  mobility  Rationale for Evaluation and Treatment: Rehabilitation  SUBJECTIVE:                                                                                                                                                                                             SUBJECTIVE STATEMENT:  Pt denies any acute changes since last visit. Pt was tired after last visit but not hurting.  Pt accompanied by: significant other, wife Veronica  PERTINENT HISTORY: CAD, GERD, HIV, HTN, ischemic cardiomyopathy, CVA  PAIN:  Are you having pain? No  PRECAUTIONS: Fall and ICD/Pacemaker; L hemi   PATIENT GOALS: to walk and use my hand   OBJECTIVE:  Note: Objective measures were completed at Evaluation unless otherwise noted.  DIAGNOSTIC FINDINGS: 12/01/23 head CT IMPRESSION: 1. Subtle hypoattenuation involving the right caudate and right lentiform nuclei which may reflect acute infarct. 2. No acute intracranial hemorrhage. 3. Remote infarct involving the right frontal operculum, insula, and superior right temporal lobe. 4. Asymmetric density of the right M1 segment. Recommend correlation with CTA. 5. ASPECTS is 8   GAIT: Findings: Gait Characteristics: step to pattern, decreased arm swing- Left, decreased step length- Right, decreased stance time- Left, decreased stride length, decreased hip/knee flexion- Left, decreased ankle dorsiflexion- Left, circumduction- Left, Left hip hike, trunk rotated posterior- Left, trunk flexed, narrow BOS, and poor foot clearance- Left, Assistive device utilized:Hemi walker, and Level of assistance: CGA and Min A                                                                                                             TREATMENT   Self-Care Vitals:   08/20/24 1023  BP: 101/61  Pulse: (!) 55    BP assessed in RUE in sitting at rest, WNL for patient.    NMR: Gait pattern: decreased hip/knee flexion- Left, decreased ankle dorsiflexion- Left,  circumduction- Left, and lateral lean- Right Distance walked: 230 ft to the R around track, other various clinic distances Assistive device utilized: Quad cane large base Level of assistance: CGA to min A for lateral weight shift onto LLE Comments: with L AFO; fatigues during 2nd lap with decreased gait speed, some scissoring with LLE, narrow BOS, decreased step length LLE  To work on L hip flexor strengthening and WB on LLE Alt L/R 2 step taps Cues for L hip flexor activation to land on step and come off of step Manual cues to weight shift onto LLE when stepping with RLE 3 x 10 reps BLE Improved performance as task progresses     PATIENT EDUCATION: Education details: continue HEP Person educated: Patient and Spouse Education method: Explanation, Demonstration, Tactile cues, and Verbal cues Education comprehension: verbalized understanding, returned demonstration, verbal cues required, tactile cues required, and needs further education  HOME EXERCISE PROGRAM: Access Code: [email or web address] URL: [email or web address] Date: 06/06/2024 Prepared by: Delon Pop  Exercises - Supine Bridge  - 1 x daily - 7 x weekly - 3 sets - 10 reps - Supine March with Posterior Pelvic Tilt  - 1 x daily - 7 x weekly - 3 sets - 10 reps - Mini Squat with Counter Support  - 1 x daily - 7 x weekly - 3 sets - 8-10 reps - Standing Knee Flexion with Counter Support  - 1 x daily - 7 x weekly - 3 sets - 6-8 reps - Standing March with Counter Support  - 1 x daily - 7 x weekly - 3 sets - 8-10 reps - Staggered Sit-to-Stand  - 1 x daily - 7 x weekly - 3 sets - 10 reps - Side Stepping with  Counter Support  - 1 x daily - 7 x weekly - 3 sets - 10 reps - Mini Squat with Counter Support  - 1 x daily - 7 x weekly - 3 sets - 10 reps  GOALS: Goals reviewed with patient? Yes  SHORT TERM GOALS: Target date: 06/22/24  Pt will be independent with initial HEP for improved functional strength and balance   Baseline: to be provided  Goal status: MET  2.  Pt will improve gait speed to >/= .33m/s to demonstrate improved community ambulation  Baseline: .41m/s with HW + CGA; .108m/s with LBQC + CGA Goal status: MET  3.  Pt will improve TUG to </= 70 secs to demonstrated reduced fall risk  Baseline: 98s with HW + MinA; 79s LBQC + CGA Goal status: IN PROGRESS  4.  Pt will improve 5x STS to </= 22 sec to demo improved functional LE strength and balance   Baseline: 30.81 with CGA + R UE; 24/5s no UE + CGA Goal status: IN PRPOGRESS   LONG TERM GOALS: Target date: 07/13/24 (to match cert date)   Pt will be independent with final HEP for improved functional strength and balance  Baseline: to be provided; provided Goal status: MET  Pt will improve gait speed to >/= .13m/s to demonstrate improved community ambulation  Baseline: .48m/s with HW + CGA; .54m/s LBQC + CGA Goal status: IN PROGRESS  Pt will improve TUG to </= 50 secs to demonstrated reduced fall risk  Baseline: 98s with HW + MinA; 66s LBQC + CGA Goal status: IN PROGRESS  Pt will improve 5x STS to </= 15 sec to demo improved functional LE strength and balance   Baseline: 30.81 with CGA + R UE; 24s R UE + CGA Goal status: IN PROGRESS  NEW SHORT TERM GOALS:       08/10/24  Pt will be compliant with updated HEP for improved functional strength and balance    Baseline: to be updated   Goal status: MET  2. Pt will improve gait speed to >/= .13m/s to demonstrate improved community ambulation Baseline: .62m/s LBQC + CGA, 0.12m/s LBQC and CGA (1/5) Goal status: IN PROGRESS  3. Pt will improve TUG to </= 50 secs to demonstrated reduced fall risk Baseline: 66s LBQC + CGA, 56.91 sec LBQC and CGA (1/5) Goal status: IN PROGRESS  4. Pt will improve 5x STS to </= 20 sec to demo improved functional LE strength and balance  Baseline:  24s R UE + CGA, 21 sec RUE and CGA (1/5) Goal status: IN PROGRESS   NEW LONG TERM GOALS: 09/07/24  Pt  will be compliant with updated final HEP for improved functional strength and balance    Baseline: to be updated   Goal status: NEW  2. Pt will improve gait speed to >/= .89m/s to demonstrate improved community ambulation Baseline: .16m/s with HW + CGA, 0.28m/s LBQC and CGA (1/5) Goal status: REVISED/DOWNGRADED  3. Pt will improve TUG to </= 50 secs to demonstrated reduced fall risk Baseline: 66s LBQC + CGA, 56.91 sec LBQC and CGA (1/5) Goal status: REVISED/DOWNGRADED  4. Pt will improve 5x STS to </= 18 sec to demo improved functional LE strength and balance  Baseline: 24s R UE + CGA, 21 sec RUE and CGA (1/5) Goal status: REVISED/DOWNGRADED   ASSESSMENT:  CLINICAL IMPRESSION: Emphasis of skilled PT session on continuing to work on gait training with [facility] followed by blocked practice of L hip flexor strengthening and increased stance  time on LLE. Pt continues to exhibit above-mentioned gait impairments. He initially struggles to perform hip flexion onto 6 step, regressed to 2 step with improved performance as tasks progresses. Additionally, he initially needs up to mod A for lateral weight shift onto his LLE but improves as task progresses with just CGA tactile cues needed to initiate weight shift. He continues to benefit from skilled PT services to work on LLE NMR as well as to work towards increased safety and independence with his functional mobility. Continue POC.    OBJECTIVE IMPAIRMENTS: Abnormal gait, decreased balance, decreased coordination, decreased endurance, decreased knowledge of condition, decreased knowledge of use of DME, decreased ROM, decreased strength, impaired tone, impaired UE functional use, and postural dysfunction.   ACTIVITY LIMITATIONS: carrying, lifting, bending, standing, squatting, stairs, transfers, bed mobility, locomotion level, and caring for others  PARTICIPATION LIMITATIONS: meal prep, cleaning, interpersonal relationship, driving, shopping, community  activity, occupation, and yard work  PERSONAL FACTORS: Age, Fitness, Past/current experiences, Time since onset of injury/illness/exacerbation, Transportation, and 3+ comorbidities: see above are also affecting patient's functional outcome.   REHAB POTENTIAL: Fair time since onset  CLINICAL DECISION MAKING: Stable/uncomplicated  EVALUATION COMPLEXITY: Low  PLAN:  PT FREQUENCY: 2x/week  PT DURATION: other: 7 weeks  PLANNED INTERVENTIONS: [id number]- PT Re-evaluation, 97750- Physical Performance Testing, 97110-Therapeutic exercises, 97530- Therapeutic activity, [id number]- Neuromuscular re-education, 97535- Self Care, [id number]- Manual therapy, [id number]- Gait training, [phone]- Orthotic Initial, [phone]- Orthotic/Prosthetic subsequent, [phone]- Aquatic Therapy, [phone] (1-2 muscles), [id number] (3+ muscles)- Dry Needling, Patient/Family education, Balance training, Stair training, Taping, Vestibular training, Visual/preceptual remediation/compensation, DME instructions, and Wheelchair mobility training  PLAN FOR NEXT SESSION: HIGT! Continue working with [facility], continue use of L LE, weight shifting in bars with toe taps, improve L hip flexion strength. Facilitate left weight shifting during ambulation/ literally anything to get more weight on the left leg (modified SLS, R LE step ups so he has to spend more time on L, etc); staggered sit to stands, L hip abd strengthening (resisted dot taps), did they get [facility]?, tall kneel at bench on mat table, resisted LLE forward step    Waddell Southgate, PT Waddell Southgate, PT, DPT, CSRS   08/20/2024, 11:14 AM        "

## 2024-08-22 ENCOUNTER — Ambulatory Visit: Admitting: Occupational Therapy

## 2024-08-22 ENCOUNTER — Ambulatory Visit: Admitting: Speech Pathology

## 2024-08-22 ENCOUNTER — Encounter: Payer: Self-pay | Admitting: Speech Pathology

## 2024-08-22 ENCOUNTER — Ambulatory Visit: Admitting: Physical Therapy

## 2024-08-22 VITALS — BP 112/64 | HR 57

## 2024-08-22 DIAGNOSIS — R262 Difficulty in walking, not elsewhere classified: Secondary | ICD-10-CM

## 2024-08-22 DIAGNOSIS — M6281 Muscle weakness (generalized): Secondary | ICD-10-CM

## 2024-08-22 DIAGNOSIS — I69354 Hemiplegia and hemiparesis following cerebral infarction affecting left non-dominant side: Secondary | ICD-10-CM

## 2024-08-22 DIAGNOSIS — R2681 Unsteadiness on feet: Secondary | ICD-10-CM

## 2024-08-22 DIAGNOSIS — R471 Dysarthria and anarthria: Secondary | ICD-10-CM

## 2024-08-22 DIAGNOSIS — R41841 Cognitive communication deficit: Secondary | ICD-10-CM

## 2024-08-22 DIAGNOSIS — R2689 Other abnormalities of gait and mobility: Secondary | ICD-10-CM

## 2024-08-22 DIAGNOSIS — R293 Abnormal posture: Secondary | ICD-10-CM

## 2024-08-22 DIAGNOSIS — R1310 Dysphagia, unspecified: Secondary | ICD-10-CM

## 2024-08-22 NOTE — Patient Instructions (Signed)
 SABRA

## 2024-08-22 NOTE — Therapy (Signed)
 " OUTPATIENT PHYSICAL THERAPY NEURO TREATMENT   Patient Name: Richard [name] MRN: [id number] DOB:[birth year], 69 y.o., male Today's Date: 08/22/2024  PCP: Ezekiel Charleston, MD REFERRING PROVIDER: Wynne Flatten, MD    END OF SESSION:  PT End of Session - 08/22/24 0849     Visit Number 22    Number of Visits 27   re-cert   Date for Recertification  98/69/73   re-cert; due to potential delay in scheduling   Authorization Type [facility]    Authorization Time Period 08/01/24-11/29/24    Authorization - Visit Number 7    Authorization - Number of Visits 15    Progress Note Due on Visit 20    PT Start Time 0845    PT Stop Time 0928    PT Time Calculation (min) 43 min    Equipment Utilized During Treatment Gait belt    Activity Tolerance Patient tolerated treatment well    Behavior During Therapy WFL for tasks assessed/performed                  Past Medical History:  Diagnosis Date   Coronary artery disease    a. cath 04/02/2014 3v dx 80-90% in D1, 80-90% mid LAD, 95% apical LAD, 95% distal inferior LV branch of LCx, total occlusion of prox RCA  b). Cath 04/03/2014 DES to mid LAD, DES x3 to prox D1, DAPT indefinitely. LCx lesion untreated   GERD (gastroesophageal reflux disease)    HIV (human immunodeficiency virus infection) (HCC)    Hypertension    Ischemic cardiomyopathy 08/09/2013   EF 20-25 percent by echo in 04/2014, s/p [employer] model [id number] serial number [id number]   Stroke [facility])    Past Surgical History:  Procedure Laterality Date   CARDIAC CATHETERIZATION  04/02/2014   DR BURNARD   ESOPHAGOGASTRODUODENOSCOPY (EGD) WITH PROPOFOL  N/A 09/08/2018   Procedure: ESOPHAGOGASTRODUODENOSCOPY (EGD) WITH PROPOFOL ;  Surgeon: Rollin Dover, MD;  Location: [facility];  Service: Endoscopy;  Laterality: N/A;   IMPLANTABLE CARDIOVERTER DEFIBRILLATOR IMPLANT N/A 09/10/2014   Procedure: IMPLANTABLE CARDIOVERTER DEFIBRILLATOR IMPLANT;  Surgeon: Jerel Balding, MD;  [facility]  [place] model [phone] serial number [id number]   IR CT HEAD LTD  12/01/2023   IR PERCUTANEOUS ART THROMBECTOMY/INFUSION INTRACRANIAL INC DIAG ANGIO  12/01/2023   IR US  GUIDE VASC ACCESS RIGHT  12/01/2023   LEFT AND RIGHT HEART CATHETERIZATION WITH CORONARY ANGIOGRAM N/A 04/02/2014   Procedure: LEFT AND RIGHT HEART CATHETERIZATION WITH CORONARY ANGIOGRAM;  Surgeon: Debby DELENA Burnard, MD;  Location: [facility] CATH LAB;  Service: Cardiovascular;  Laterality: N/A;   MEDIAL COLLATERAL LIGAMENT REPAIR, KNEE Right 08/25/2015   Procedure: RIGHT THUMB RADICAL COLLATERAL LIGAMENT REPAIR;  Surgeon: Donnice Robinsons, MD;  Location: [facility] OR;  Service: Orthopedics;  Laterality: Right;   PERCUTANEOUS CORONARY STENT INTERVENTION (PCI-S) N/A 04/03/2014   Procedure: PERCUTANEOUS CORONARY STENT INTERVENTION (PCI-S);  Surgeon: Peter M Jordan, MD;  Location: [facility] CATH LAB;  Service: Cardiovascular;  Laterality: N/A;   RADIOLOGY WITH ANESTHESIA N/A 12/01/2023   Procedure: RADIOLOGY WITH ANESTHESIA;  Surgeon: Radiologist, Medication, MD;  Location: [facility] OR;  Service: Radiology;  Laterality: N/A;  BETHANY BRADFORD DILATION N/A 09/08/2018   Procedure: SAVORY DILATION;  Surgeon: Rollin Dover, MD;  Location: [facility];  Service: Endoscopy;  Laterality: N/A;   Patient Active Problem List   Diagnosis Date Noted   ICD (implantable cardioverter-defibrillator) discharge 03/23/2024   Hyperlipidemia 01/02/2024   CKD stage 3a, GFR 45-59 ml/min (HCC) 01/02/2024   Depression  with anxiety 12/27/2023   Chronic systolic congestive heart failure (HCC) 12/23/2023   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   Acute ischemic right MCA stroke (HCC) 12/01/2023   Schizophrenia, unspecified (HCC) 03/07/2023   Recurrent major depression 03/07/2023   Pseudophakia of left eye 03/07/2023   Acid reflux 03/07/2023   Dyspepsia 03/07/2023   Anxiety state 03/07/2023   Cortical age-related cataract of right eye 03/07/2023   Psychotic disorder  (HCC) 03/07/2023   CAD (coronary artery disease) 03/07/2023   Abdominal mass 07/12/2021   Peripheral vascular disease 02/04/2021   Overweight 01/21/2020   Dysphagia 12/04/2018   Hiatal hernia 04/19/2018   Gastroesophageal reflux disease 04/03/2018   Ischemic congestive cardiomyopathy (HCC) 10/17/2017   Generalized anxiety disorder 10/17/2017   Prediabetes 10/16/2017   Acquired thrombocytopenia 10/16/2017   Chronic kidney disease, stage 3 (HCC) 10/16/2017   Primary erectile dysfunction 10/13/2017   Chronic combined systolic and diastolic heart failure (HCC) 12/30/2015   Traumatic rupture of collateral ligament of other finger at metacarpophalangeal and interphalangeal joint, subsequent encounter 10/30/2015   Traumatic rupture of collateral ligament of other finger at metacarpophalangeal and interphalangeal joint, initial encounter 08/12/2015   VT (ventricular tachycardia) (HCC) 12/31/2014   ICD (implantable cardioverter-defibrillator) in place 09/10/2014   At risk for sudden cardiac death 14-Sep-2014   Systolic heart failure (HCC) 08/09/2014   Coronary artery disease involving native coronary artery of native heart without angina pectoris 04/04/2014   Unstable angina (HCC) 04/03/2014   Cardiomyopathy, ischemic 03/22/2014   History of CVA (cerebrovascular accident) 02/20/2014   Human immunodeficiency virus infection (HCC) 02/20/2014   Essential hypertension 02/20/2014   Hyperlipidemia LDL goal <70 02/20/2014   Depression 02/20/2014   Cerebral infarction (HCC) 08/09/2006    ONSET DATE: 05/15/24 referral    REFERRING DIAG: I63.9 (ICD-10-CM) - Cerebral infarction, unspecified   THERAPY DIAG:  Hemiplegia and hemiparesis following cerebral infarction affecting left non-dominant side (HCC)  Abnormal posture  Muscle weakness (generalized)  Unsteadiness on feet  Difficulty in walking, not elsewhere classified  Other abnormalities of gait and mobility  Rationale for Evaluation and  Treatment: Rehabilitation  SUBJECTIVE:                                                                                                                                                                                             SUBJECTIVE STATEMENT:  Pt denies any acute changes since last visit. Pt's wife asking if he still needs a toe cap, pt wanting to wear different shoes. Encouraged them to bring in other shoes next session for assessment of gait with shoes without toe cap.  Pt  accompanied by: significant other, wife Veronica  PERTINENT HISTORY: CAD, GERD, HIV, HTN, ischemic cardiomyopathy, CVA  PAIN:  Are you having pain? No  PRECAUTIONS: Fall and ICD/Pacemaker; L hemi   PATIENT GOALS: to walk and use my hand   OBJECTIVE:  Note: Objective measures were completed at Evaluation unless otherwise noted.  DIAGNOSTIC FINDINGS: 12/01/23 head CT IMPRESSION: 1. Subtle hypoattenuation involving the right caudate and right lentiform nuclei which may reflect acute infarct. 2. No acute intracranial hemorrhage. 3. Remote infarct involving the right frontal operculum, insula, and superior right temporal lobe. 4. Asymmetric density of the right M1 segment. Recommend correlation with CTA. 5. ASPECTS is 8   GAIT: Findings: Gait Characteristics: step to pattern, decreased arm swing- Left, decreased step length- Right, decreased stance time- Left, decreased stride length, decreased hip/knee flexion- Left, decreased ankle dorsiflexion- Left, circumduction- Left, Left hip hike, trunk rotated posterior- Left, trunk flexed, narrow BOS, and poor foot clearance- Left, Assistive device utilized:Hemi walker, and Level of assistance: CGA and Min A                                                                                                             TREATMENT   Self-Care Vitals:   08/22/24 0852  BP: 112/64  Pulse: (!) 57   BP assessed in RUE in sitting at rest, WNL for patient.     NMR: Gait pattern: decreased hip/knee flexion- Left, decreased ankle dorsiflexion- Left, circumduction- Left, and lateral lean- Right Distance walked: 230 ft to the R around track, other various clinic distances Assistive device utilized: Quad cane large base Level of assistance: CGA to min A for lateral weight shift onto LLE Comments: with L AFO; fatigues during 2nd lap with decreased gait speed, some scissoring with LLE, narrow BOS, decreased step length LLE   To work on proximal LE strengthening and WB through L side: Tall kneel with bench on mat table +2 to get into position with assist needed for LLE management due to extensor tone of limb Mod to max A for tall kneeling balance during therapy tasks Attempt to extend fingers of L hand for WB, difficulty fully straightening fingers and keeping them straight during activity Pt able to retrieve squigz x 15 reps with several LOB onto forearms due to LLE kicking out with extensor tone Pt able to return squigz to container x 15 reps with several LOB onto forearms due to LLE kicking out with extensor tone Pt assisted back to sitting position EOM with min to mod A Pt then noted to have a skin tear on his L shin due to his LLE kicking out off of mat table from extensor tone Cleaned wound with antiseptic wide and placed a band-aid over skin tear    PATIENT EDUCATION: Education details: continue HEP, bring shoes without toe cap next visit Person educated: Patient and Spouse Education method: Explanation, Demonstration, Tactile cues, and Verbal cues Education comprehension: verbalized understanding, returned demonstration, verbal cues required, tactile cues required, and needs further education  HOME EXERCISE PROGRAM: Access Code:  [email or web address] URL: [email or web address] Date: 06/06/2024 Prepared by: Delon Pop  Exercises - Supine Bridge  - 1 x daily - 7 x weekly - 3 sets - 10 reps - Supine March with Posterior Pelvic  Tilt  - 1 x daily - 7 x weekly - 3 sets - 10 reps - Mini Squat with Counter Support  - 1 x daily - 7 x weekly - 3 sets - 8-10 reps - Standing Knee Flexion with Counter Support  - 1 x daily - 7 x weekly - 3 sets - 6-8 reps - Standing March with Counter Support  - 1 x daily - 7 x weekly - 3 sets - 8-10 reps - Staggered Sit-to-Stand  - 1 x daily - 7 x weekly - 3 sets - 10 reps - Side Stepping with Counter Support  - 1 x daily - 7 x weekly - 3 sets - 10 reps - Mini Squat with Counter Support  - 1 x daily - 7 x weekly - 3 sets - 10 reps  GOALS: Goals reviewed with patient? Yes  SHORT TERM GOALS: Target date: 06/22/24  Pt will be independent with initial HEP for improved functional strength and balance  Baseline: to be provided  Goal status: MET  2.  Pt will improve gait speed to >/= .97m/s to demonstrate improved community ambulation  Baseline: .81m/s with HW + CGA; .80m/s with LBQC + CGA Goal status: MET  3.  Pt will improve TUG to </= 70 secs to demonstrated reduced fall risk  Baseline: 98s with HW + MinA; 79s LBQC + CGA Goal status: IN PROGRESS  4.  Pt will improve 5x STS to </= 22 sec to demo improved functional LE strength and balance   Baseline: 30.81 with CGA + R UE; 24/5s no UE + CGA Goal status: IN PRPOGRESS   LONG TERM GOALS: Target date: 07/13/24 (to match cert date)   Pt will be independent with final HEP for improved functional strength and balance  Baseline: to be provided; provided Goal status: MET  Pt will improve gait speed to >/= .37m/s to demonstrate improved community ambulation  Baseline: .93m/s with HW + CGA; .67m/s LBQC + CGA Goal status: IN PROGRESS  Pt will improve TUG to </= 50 secs to demonstrated reduced fall risk  Baseline: 98s with HW + MinA; 66s LBQC + CGA Goal status: IN PROGRESS  Pt will improve 5x STS to </= 15 sec to demo improved functional LE strength and balance   Baseline: 30.81 with CGA + R UE; 24s R UE + CGA Goal status: IN  PROGRESS  NEW SHORT TERM GOALS:       08/10/24  Pt will be compliant with updated HEP for improved functional strength and balance    Baseline: to be updated   Goal status: MET  2. Pt will improve gait speed to >/= .81m/s to demonstrate improved community ambulation Baseline: .56m/s LBQC + CGA, 0.43m/s LBQC and CGA (1/5) Goal status: IN PROGRESS  3. Pt will improve TUG to </= 50 secs to demonstrated reduced fall risk Baseline: 66s LBQC + CGA, 56.91 sec LBQC and CGA (1/5) Goal status: IN PROGRESS  4. Pt will improve 5x STS to </= 20 sec to demo improved functional LE strength and balance  Baseline:  24s R UE + CGA, 21 sec RUE and CGA (1/5) Goal status: IN PROGRESS   NEW LONG TERM GOALS: 09/07/24  Pt will be compliant with updated final HEP for improved functional strength and balance  Baseline: to be updated   Goal status: NEW  2. Pt will improve gait speed to >/= .19m/s to demonstrate improved community ambulation Baseline: .55m/s with HW + CGA, 0.87m/s LBQC and CGA (1/5) Goal status: REVISED/DOWNGRADED  3. Pt will improve TUG to </= 50 secs to demonstrated reduced fall risk Baseline: 66s LBQC + CGA, 56.91 sec LBQC and CGA (1/5) Goal status: REVISED/DOWNGRADED  4. Pt will improve 5x STS to </= 18 sec to demo improved functional LE strength and balance  Baseline: 24s R UE + CGA, 21 sec RUE and CGA (1/5) Goal status: REVISED/DOWNGRADED   ASSESSMENT:  CLINICAL IMPRESSION: Emphasis of skilled PT session on continuing to work on gait training with [facility] followed by continue practice of LLE NMR in tall kneel position with bench on mat table. Pt continues to exhibit above-mentioned gait impairments. He exhibits ongoing LLE extensor tone making maintaining tall kneel position difficult at times, requires mod to max A for balance at times. He also has some flexor tone in his LUE making it difficult to maintain WB through this limb on bench. He does exhibit improved balance as compared to  last attempt of tall kneeling with improved ability to maintain upright posture with decreased UE support. He also continues to be very fatigued by tall kneel tasks. He continues to benefit from skilled PT services to work on LLE NMR as well as to work towards increased safety and independence with his functional mobility. Continue POC.    OBJECTIVE IMPAIRMENTS: Abnormal gait, decreased balance, decreased coordination, decreased endurance, decreased knowledge of condition, decreased knowledge of use of DME, decreased ROM, decreased strength, impaired tone, impaired UE functional use, and postural dysfunction.   ACTIVITY LIMITATIONS: carrying, lifting, bending, standing, squatting, stairs, transfers, bed mobility, locomotion level, and caring for others  PARTICIPATION LIMITATIONS: meal prep, cleaning, interpersonal relationship, driving, shopping, community activity, occupation, and yard work  PERSONAL FACTORS: Age, Fitness, Past/current experiences, Time since onset of injury/illness/exacerbation, Transportation, and 3+ comorbidities: see above are also affecting patient's functional outcome.   REHAB POTENTIAL: Fair time since onset  CLINICAL DECISION MAKING: Stable/uncomplicated  EVALUATION COMPLEXITY: Low  PLAN:  PT FREQUENCY: 2x/week  PT DURATION: other: 7 weeks  PLANNED INTERVENTIONS: 97164- PT Re-evaluation, 97750- Physical Performance Testing, 97110-Therapeutic exercises, 97530- Therapeutic activity, [id number]- Neuromuscular re-education, 97535- Self Care, [id number]- Manual therapy, [id number]- Gait training, [phone]- Orthotic Initial, [phone]- Orthotic/Prosthetic subsequent, [phone]- Aquatic Therapy, [phone] (1-2 muscles), [phone] (3+ muscles)- Dry Needling, Patient/Family education, Balance training, Stair training, Taping, Vestibular training, Visual/preceptual remediation/compensation, DME instructions, and Wheelchair mobility training  PLAN FOR NEXT SESSION: HIGT! Continue working with [facility], continue  use of L LE, weight shifting in bars with toe taps, improve L hip flexion strength. Facilitate left weight shifting during ambulation/ literally anything to get more weight on the left leg (modified SLS, R LE step ups so he has to spend more time on L, etc); staggered sit to stands, L hip abd strengthening (resisted dot taps), did they get [facility]?, tall kneel at bench on mat table, resisted LLE forward step, shoes without toe cap? How is skin tear on L shin?    Sugey Trevathan, PT [place], PT, DPT, CSRS   08/22/2024, 1:04 PM        "

## 2024-08-22 NOTE — Therapy (Signed)
 " OUTPATIENT SPEECH LANGUAGE PATHOLOGY TREATMENT   Patient Name: Richard [name] MRN: [id number] DOB:Aug 13, 1955, 69 y.o., male Today's Date: 08/22/2024  PCP: [name], MD REFERRING PROVIDER: [name], MD  END OF SESSION:  End of Session - 08/22/24 1004     Visit Number 8    Number of Visits 25    Date for Recertification  10/03/24    SLP Start Time 1015    SLP Stop Time  1100    SLP Time Calculation (min) 45 min    Activity Tolerance Patient tolerated treatment well              Past Medical History:  Diagnosis Date   Coronary artery disease    a. cath 04/02/2014 3v dx 80-90% in D1, 80-90% mid LAD, 95% apical LAD, 95% distal inferior LV branch of LCx, total occlusion of prox RCA  b). Cath 04/03/2014 DES to mid LAD, DES x3 to prox D1, DAPT indefinitely. LCx lesion untreated   GERD (gastroesophageal reflux disease)    HIV (human immunodeficiency virus infection) (HCC)    Hypertension    Ischemic cardiomyopathy 08/09/2013   EF 20-25 percent by echo in 04/2014, s/p [employer] [place] model [id number] serial number [id number]   Stroke [facility])    Past Surgical History:  Procedure Laterality Date   CARDIAC CATHETERIZATION  04/02/2014   DR BURNARD   ESOPHAGOGASTRODUODENOSCOPY (EGD) WITH PROPOFOL  N/A 09/08/2018   Procedure: ESOPHAGOGASTRODUODENOSCOPY (EGD) WITH PROPOFOL ;  Surgeon: Rollin Dover, MD;  Location: [facility] ENDOSCOPY;  Service: Endoscopy;  Laterality: N/A;   IMPLANTABLE CARDIOVERTER DEFIBRILLATOR IMPLANT N/A 09/10/2014   Procedure: IMPLANTABLE CARDIOVERTER DEFIBRILLATOR IMPLANT;  Surgeon: Jerel Balding, MD;  [facility] [place] model [id number] serial number [id number]   IR CT HEAD LTD  12/01/2023   IR PERCUTANEOUS ART THROMBECTOMY/INFUSION INTRACRANIAL INC DIAG ANGIO  12/01/2023   IR US  GUIDE VASC ACCESS RIGHT  12/01/2023   LEFT AND RIGHT HEART CATHETERIZATION WITH CORONARY ANGIOGRAM N/A 04/02/2014   Procedure: LEFT AND RIGHT HEART CATHETERIZATION WITH CORONARY  ANGIOGRAM;  Surgeon: Debby DELENA Burnard, MD;  Location: [facility] CATH LAB;  Service: Cardiovascular;  Laterality: N/A;   MEDIAL COLLATERAL LIGAMENT REPAIR, KNEE Right 08/25/2015   Procedure: RIGHT THUMB RADICAL COLLATERAL LIGAMENT REPAIR;  Surgeon: Donnice Robinsons, MD;  Location: [facility] OR;  Service: Orthopedics;  Laterality: Right;   PERCUTANEOUS CORONARY STENT INTERVENTION (PCI-S) N/A 04/03/2014   Procedure: PERCUTANEOUS CORONARY STENT INTERVENTION (PCI-S);  Surgeon: Peter M Jordan, MD;  Location: [facility] CATH LAB;  Service: Cardiovascular;  Laterality: N/A;   RADIOLOGY WITH ANESTHESIA N/A 12/01/2023   Procedure: RADIOLOGY WITH ANESTHESIA;  Surgeon: Radiologist, Medication, MD;  Location: [facility] OR;  Service: Radiology;  Laterality: N/A;  BETHANY BRADFORD DILATION N/A 09/08/2018   Procedure: SAVORY DILATION;  Surgeon: Rollin Dover, MD;  Location: [facility] ENDOSCOPY;  Service: Endoscopy;  Laterality: N/A;   Patient Active Problem List   Diagnosis Date Noted   ICD (implantable cardioverter-defibrillator) discharge 03/23/2024   Hyperlipidemia 01/02/2024   CKD stage 3a, GFR 45-59 ml/min (HCC) 01/02/2024   Depression with anxiety 12/27/2023   Chronic systolic congestive heart failure (HCC) 12/23/2023   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   Acute ischemic right MCA stroke (HCC) 12/01/2023   Schizophrenia, unspecified (HCC) 03/07/2023   Recurrent major depression 03/07/2023   Pseudophakia of left eye 03/07/2023   Acid reflux 03/07/2023   Dyspepsia 03/07/2023   Anxiety state 03/07/2023   Cortical age-related cataract of right eye 03/07/2023  Psychotic disorder (HCC) 03/07/2023   CAD (coronary artery disease) 03/07/2023   Abdominal mass 07/12/2021   Peripheral vascular disease 02/04/2021   Overweight 01/21/2020   Dysphagia 12/04/2018   Hiatal hernia 04/19/2018   Gastroesophageal reflux disease 04/03/2018   Ischemic congestive cardiomyopathy (HCC) 10/17/2017   Generalized anxiety disorder 10/17/2017    Prediabetes 10/16/2017   Acquired thrombocytopenia 10/16/2017   Chronic kidney disease, stage 3 (HCC) 10/16/2017   Primary erectile dysfunction 10/13/2017   Chronic combined systolic and diastolic heart failure (HCC) 12/30/2015   Traumatic rupture of collateral ligament of other finger at metacarpophalangeal and interphalangeal joint, subsequent encounter 10/30/2015   Traumatic rupture of collateral ligament of other finger at metacarpophalangeal and interphalangeal joint, initial encounter 08/12/2015   VT (ventricular tachycardia) (HCC) 12/31/2014   ICD (implantable cardioverter-defibrillator) in place 09/10/2014   At risk for sudden cardiac death September 16, 2014   Systolic heart failure (HCC) 08/09/2014   Coronary artery disease involving native coronary artery of native heart without angina pectoris 04/04/2014   Unstable angina (HCC) 04/03/2014   Cardiomyopathy, ischemic 03/22/2014   History of CVA (cerebrovascular accident) 02/20/2014   Human immunodeficiency virus infection (HCC) 02/20/2014   Essential hypertension 02/20/2014   Hyperlipidemia LDL goal <70 02/20/2014   Depression 02/20/2014   Cerebral infarction (HCC) 08/09/2006    ONSET DATE: 11/2023  REFERRING DIAG: I63.9 (ICD-10-CM) - Cerebral infarction, unspecified  THERAPY DIAG:  Cognitive communication deficit  Dysphagia, unspecified type  Dysarthria and anarthria  Rationale for Evaluation and Treatment: Rehabilitation  SUBJECTIVE:   SUBJECT.I got choked this morning Pt accompanied by: self and significant other  PERTINENT HISTORY: Pt is a 69 y.o. male who has a recent hx of CVA back in April, 2025. Pt noted residual deficits include dysarthria, cognitive-communication deficits and dysphagia. Pt's wife was present and provided additional hx. Pt's wife notices that pt's speech is low in volume during conversations. Pt recently received HH ST, primarily targeting dysarthria at sentence and conversation levels. Pt also  received ST in CIR 11/2023-12/2023, targeting swallowing, dysarthria, and cognition. Pt currently consumes regular/thin-liquid diet. Pt endorses coughing and choking on secretions on occasion. Pt's wife handles household responsibilities (finances, medication management, etc.). Pt's wife states that pt tends to lose focus during longer tasks. Pt enjoys watching sports at home. PMHx: CAD, GERD, HIV, HTN, ischemic cardiomyopathy, CVA.  PAIN:  Are you having pain? No  FALLS: Has patient fallen in last 6 months?  See PT evaluation for details  LIVING ENVIRONMENT: Lives with: lives with their family and lives with their spouse Lives in: House/apartment  PLOF:  Level of assistance: Comment: Independent with driving and medication management; wife still did finances  Employment: On disability  PATIENT GOALS: To improve thinking skills, swallowing, and speech.  OBJECTIVE:  Note: Objective measures were completed at Evaluation unless otherwise noted.  DIAGNOSTIC FINDINGS: MBSS from 12/08/23 Patient presents with mild oropharyngeal dysphagia. Oral phase is characterized by reduced lingual movement and labial seal resulting in anterior and posterior spillage. Patient independently utilized multiple swallows and digital sweep when necessary to clear oral residuals. Pharyngeal phase is remarkable for reduced epiglottic inversion and pharyngeal stripping wave resulting in mild pharyngeal residuals, mainly present in the vallecula and pyriform sinuses. Penetration present x2 with thin liquids (x1 via straw, x1 during liquid wash), though likely due to premature spillage and mistiming of epiglottic inversion and laryngeal vestibular closure. Pill consumed in puree, requiring additional bites of PO due to suspected esophageal retention. Recommend continuation of D2/thin liquid diet  with small, single sips of liquids. Patient should continue to utilize digital sweep to clear buccal pocketing of solids. Recommend  medications administered whole in puree with additional bites to promote esophogeal clearance.     COGNITION: Overall cognitive status: Impaired Areas of impairment:  Attention: Impaired: Selective Memory: Impaired: Immediate Working Short term Psychologist, Educational function: Impaired: Impulse control, Organization, Planning, and Error awareness Behavior: Impulsive Functional deficits: Medication management, time management.     MOTOR SPEECH: Overall motor speech: impaired Level of impairment: Conversation Respiration: thoracic breathing and diaphragmatic/abdominal breathing Phonation: normal and low vocal intensity Resonance: WFL Articulation: Impaired: conversation Intelligibility: Intelligibility reduced Motor planning: Appears intact Motor speech errors: aware and consistent Interfering components: N/A Effective technique: increased vocal intensity and pacing  ORAL MOTOR EXAMINATION: Overall status: Impaired:   Lingual: Left (Symmetry and Strength) Comments: N/A   STANDARDIZED ASSESSMENTS: SLUMS: 20/30  PATIENT REPORTED OUTCOME MEASURES (PROM): EAT-10: 20/36 and Communication Effectiveness Survey: 11/24                                                                                                                            TREATMENT DATE:   08/22/24: Dysphagia: Targeted consistent completion of HEP for OT/PT/ST by collaborating with pt and spouse to generate routine times to completed HEPs BID. They agreed that after breakfast and after lunch he will complete HEPs. Spouse reports that Johanthan will often say I'll do them later or give me an hour - we reviewed that he is to complete these as the agreed times. Using mirror for feedback, Leaman completing lingual strengthening exercises with and without resistance 10x each with occasional min verbal cues and modeling. He completed Masko 10x (5x then a break, then 5 more) and effortful swallow 15x with occasional min A. He  followed swallow precautions with rare min A for water sips 5/5x. They report left labial leakage with meals. Instructed him to use a mirror while eating to give feedback and help control leakage.  Cognition/Dysarthria: 1 episode of communication breakdown today due to dysarthria. Kaivon required questioning cues to Id and correct breakdown by using over articulation and ensuring he says the last sound in his words. Targeted carryover of dysarthria strategies generating sentences with multi-syllabic word with occasional min A to articulate each syllable and last sound. Volume maintained at 72-74dB with mod I. They got 2 1 week pill organizers - Hermon successfully opened the correct day and took meds this morning - he continues to require A to place pills in applesauce due to UE paresis. At conversation level, Mort is maintaining WNL volume with mod I and requires usual min verbal cues to ID and correct dysarthric errors.   08/20/24: Dysphagia: Pt reports inconsistent practice since last visit. Today he completed Masako and effortful swallows 10x wit hmod I. Isometric lingual strengthening with resistance completed 10x each medial and R/L lateral positions with 3 second hold each. Provided tongue depressor to complete at home, however also educated them that  the can use a spoon or hand as well. Ongoing education to swallow more frequently, especially when laying down to reduce choking on saliva. Instructed them to request referral to ENT for throat discomfort and dysphagia.   Cognition/Dysarthria: Spouse has placed sign to help Naeem remember to turn off the water in the bathroom. He has not forgotten since the sign has been placed. They have not posted a new calendar for January as of yet - will work on this this week. Targeted recall of meds and use of pill organizer. Their pill organizer is large for 4x daily. With verbal cues, Hasson identified and opened days and times with rare min A. He will need to  be able to turn box over to retrieve pills - Spouse generated strategy of getting 2 pill boxes, 1 for day and 1 for night as he only takes meds twice a day the 4x a day pill box is harder to manage.   08/16/23: Dysphagia: Pt has been practicing HEP regularly at home. Today he completed pharyngeal strengthening exercises for optimizing pt's swallow safety and efficiency. Effortful swallows and Masako were completed with 100% consistency given rare min A. Pt completed effortful swallows 20x and Masako 5x. SLP challenged pt to complete lingual strengthening (isometric) exercises in the medial and right/left lateral positions. Pt performed medial and lateral lingual exercises with 100% consistency and completed 8 reps/5x for each position.  Plan is to continue pharyngeal/lingual swallow exercises.  Cognition: Pt enters with some carryover of dysarthria strategies (over-articulation and increased volume) requiring occasional min A during structured tasks. SLP introduced memory compensations for optimizing pt's short-term memory recall at home. Pt notes that he forgets to turn the sink and lights off at home. SLP educated pt and pt's wife in using visual cues for improving pt's short-term memory recall and attention to objects in his environment. Pt states that he has forgotten his walker in the bathroom twice recently. SLP encouraged pt to stop, look at his surroundings before proceeding to leave the bathroom to support attention for remembering his walker to optimize safety awareness. SLP inquired pt about his current use of a calendar and other external memory supports at home. Pt's wife notes that they have a new 2026 calendar that has not been put up yet. SLP educated pt in using external memory supports for increasing pt independence with his short-term memory recall, reducing caregiver burden. SLP provided pt a temporary January, 2026 calendar and had pt identify the current date and an upcoming date for  pt's favorite sports teams. Pt was successful with identifying the current date but required min verbal A to identify an upcoming sports game due to pt's selective attention/slight impulsivity. Pt is challenged to use calendar at home to write down pertinent appointments/information for optimizing short-term memory recall. SLP provided pt memory compensations sheet to utilize for incorporating more short-term memory strategies at home. Pt and pt's wife are agreeable to trying new memory compensations. Plan is to continue dysarthria tx and building cognitive-communication support systems. Pt was asked to bring 2 of his current medications to practice medication management in upcoming session.   08/14/23: Dysphagia: Pt has been practicing HEP regularly at home. Today he completed pharyngeal strengthening exercises for optimizing pt's swallow safety and efficiency. Effortful swallows and Masako were completed with 100% consistency given rare min A. Pt completed effortful swallows 20x and Masako 10x. Pt notes that his throat feels more irritated after performing pharyngeal strengthening exercises. Pt's wife states that their  doctor wanted to inform SLP about getting an Endoscopy exam performed for checking throat health/swallowing. SLP educated family about importance of objective instrumental testing for examining throat health and swallow function. Plan is to continue pharyngeal swallow exercises and SLP to f/u with pt's PCP about pursuing an objective examination of swallowing, specifically FEES.   Dysarthria: Pt enters with some carryover of dysarthria strategies (over-articulation and increased volume). Pt engaged in structured description task, where he was required to use dysarthria strategies for responses. Pt utilized dysarthria strategies suring this task in 90% of opportunities given occasional min A. SLP engaged pt in structured conversation, while utilizing dual-task training for challenging pt's  selective attention to using dysarthria strategies. Pt used dysarthria strategies with 85% consistency during structured conversation given occasional min A. Pt displayed adequate use of dysarthria strategies during dual-task challenge. Pt is making progress with speech goals. Plan is to continue structured conversation exercises for generalizing dysarthria strategies into conversation-level speech production.   07/30/24: Dysphagia: Pt has been practicing HEP regularly at home. Today he completed lingual resistance exercises and pharyngeal strengthening exercises for optimizing pt's swallow safety and efficiency. Lingual medialization with resistance completed 10/10x with occasional min modeling and verbal cues. Lingual elevation and lateralization with resistance were each completed 10/10x with occasional min A for modeling. Effortful swallows and Masako each completed 10/10x with occasional min A with breaks given every 3 reps of Masako. Trained in strategy of educating family not to talk to you when you are eating over the holidays, as well as using intent to chew and swallow even with guests or group meals. Spouse verbalizes she will cue him for this as well. Dysarthria: Wilfred enters without carryover of strategies for intelligible speech. Reviewed these, targeted carryover repeating and generating sentences with 4-5 syllable words - Alyssa required usual mod verbal cues and modeling initially for over articulation and saying each syllable. In structured description task, Christiaan carried over slow rate, volume and over articulation 8/10 phrases with occasional min A. Initiated training in speaking and swallowing (and walking) with intent - Rivaldo and his spouse verbalized understanding. Spouse gave an example of when she interrupted Semaj as he was getting up from the table and he had a near fall as he lost attention and intent when standing.    07/24/24: Dysphagia: Pt has been practicing HEP regularly at  home. SLP reviewed lingual resistance exercises and pharyngeal strengthening exercises for optimizing pt's swallow safety and efficiency. Lingual medialization with resistance completed 10/10x with occasional min modeling and verbal cues. Lingual elevation and lateralization with resistance were each completed 5/5x with occasional min A for modeling. Effortful swallows and Masako each completed 10/10x with occasional min A. Pt notes that he continues to experiencing increased coughing with saliva at night-time. SLP educated pt about practicing swallow HEP at night before bed to improve secretion management for saliva and reduce coughing. Pt is agreeable andwill try practicing HEP at night. Plan is to continue lingual and pharyngeal strengthening exercises for optimizing swallow safety and efficiency for QOL. Dysarthria: Targeted compensations for dysarthria (over-articulation, increased loudness) in sentence readings. SLP encouraged pt to utilize swallows before speaking to reduce choking on saliva during speech. Pt utilize a swallow prior to speaking in 90% of opportunities given occasional min verbal A. During 5-7 word sentence readings, pt utilized dysarthria compensations in 90% of opportunities given occasional min verbal A. SLP challenged pt to utilize dysarthria compensations during relevant phrases (personal information). Pt continued to utilize compensations with 90%  consistency given occasional min A. Pt is making progress with speech and swallow goals. SLP provided pt homework focused on /s/ sound production in multi-syllabic words and functional phrases; SLP encouraged pt to continue utilizing over-articulation during HEP for maximizing speech intelligibility. Plan is to continue sentence readings and functional phrase readings for improving pt use of dysarthria compensations.   07/16/24: Dysphagia: Initiated HEP for dysphagia and lingual weakness. See Patient Instructions. Lingual elevation and  lateralization with resistance exercises completed 15/15x with occasional min modeling and verbal cues. Effortful swallows and Masako completed 10/10x with occasional min A. Targeted more frequent swallowing and swallowing prior to speaking to reduce choking on saliva. With usual verbal cues, Daysen demonstrated swallow prior to speaking on structured speech task. He required frequent cues to swallow prior to speaking in conversation. Introduced concept of swallowing with intent, paying attention when eating and drinking  Dysarthria: Targeted compensations for dysarthria in structured speech task generating 3 sentence descriptions - Volume remained WNL with rare min A. Slow rate, over articulation and separating syllables required occasional min modeling and verbal cues. Aldrick required cueing to ID and correct slurred/unintelligible utterances 3/3x initially. Targeted 3 syllable words - using separate syllables at slow rate to compensate for dysarthria generating sentences with these words -After initial modeling and instruction, Lenward was intelligible in sentences with 3 syllable words 12/12x. Generated list of personally relevant phrases to practice including biographical information he uses with doctors' offices, and most frequent meal orders. At the end of session, Dagan Id'd and self correct imprecise speech in conversation with spouse with mod I.  07/11/24: Evaluation completed. No tx was conducted on this date.    PATIENT EDUCATION: Education details: Evaluation results/goal setting. Person educated: Patient and Spouse Education method: Explanation Education comprehension: verbalized understanding   GOALS: Goals reviewed with patient? Yes  SHORT TERM GOALS: Target date: 08/15/2024  Pt will achieve average >70 dB during 8 minute conversation given occasional min A. Baseline: Goal status: ONGOING  2.  Pt will perform pharyngeal and lingual strengthening exercises with 90% consistency  when given rare min A. Baseline:  Goal status: ONGOING  3.  Pt and pt's family will utilize at least 1 external memory strategy for maximizing attention during medication management given occasional min A.  Baseline:  Goal status: ONGOING  4.  Pt will display use of aspiration precautions during PO trials of regular/thin-liquids in 90% of opportunities given rare min A Baseline:  Goal status: ONGOING  5.  Pt will achieve 95-97% speech intelligibility during 8 minute conversation with familiar listeners given occasional min A.  Baseline:  Goal status: ONGOING   LONG TERM GOALS: Target date: 10/03/2024  Pt will achieve average >70 dB during a 12 minute conversation given rare min A.  Baseline:  Goal status: ONGOING  2.  Pt will reduce score on EAT-10 PROM, indicating improved self-perception of swallowing function.  Baseline: 20/36 Goal status: ONGOING  3.  Pt and pt's family will describe and demonstrate at least 2 memory strategies for optimizing pt independence with certain ADL tasks (medication management) given rare min A. Baseline:  Goal status: ONGOING  4.  Pt will improve score on Communicative Effectiveness Survey, indicating speech is optimal for successful communication in different contexts.  Baseline: 11/24 Goal status: ONGOING   ASSESSMENT:  CLINICAL IMPRESSION: Patient is a 69 y.o. male who was seen today for evaluation of motor speech, dysphagia, and cognition. Pt presents with mild-moderate dysarthria characterized by consistent, imprecise articulation and reduced  vocal loudness at the conversational level. Pt also presents with mild-moderate cognitive communication deficits primarily characterized by short-term memory impairment, executive function deficits, and impulsive/inattentive behavior. Pt has hx of oropharyngeal dysphagia and currently still experience difficulty with swallowing. According to EAT-10, pt notes that he experiences difficulty with both  solids and liquids during swallowing. Recommend ST targeting conversation-level dysarthria tx and cognitive-communication tx focused on establishing external memory aids for optimizing pt communication effectiveness, safety, and independence. In addition, recommend swallow tx focused on pharyngeal strengthening exercises and aspiration precaution strategies to improve pt perception of swallowing difficulties.   OBJECTIVE IMPAIRMENTS: include attention, memory, executive functioning, dysarthria, and dysphagia. These impairments are limiting patient from managing medications, ADLs/IADLs, effectively communicating at home and in community, and safety when swallowing. Factors affecting potential to achieve goals and functional outcome are N/A. Patient will benefit from skilled SLP services to address above impairments and improve overall function.  REHAB POTENTIAL: Good  PLAN:  SLP FREQUENCY: 2x/week  SLP DURATION: 12 weeks  PLANNED INTERVENTIONS: Aspiration precaution training, Pharyngeal strengthening exercises, Internal/external aids, Functional tasks, SLP instruction and feedback, Patient/family education, [phone] Treatment of speech (30 or 45 min) , and [id number] Treatment of swallowing function   Leita Hoehn Ms, CCC-SLP 08/22/2024, 12:18 PM      "

## 2024-08-27 ENCOUNTER — Encounter: Payer: Self-pay | Admitting: Occupational Therapy

## 2024-08-27 ENCOUNTER — Ambulatory Visit: Admitting: Speech Pathology

## 2024-08-27 ENCOUNTER — Ambulatory Visit: Admitting: Physical Therapy

## 2024-08-27 ENCOUNTER — Encounter: Payer: Self-pay | Admitting: Speech Pathology

## 2024-08-27 ENCOUNTER — Ambulatory Visit: Admitting: Occupational Therapy

## 2024-08-27 DIAGNOSIS — R41841 Cognitive communication deficit: Secondary | ICD-10-CM

## 2024-08-27 DIAGNOSIS — R471 Dysarthria and anarthria: Secondary | ICD-10-CM

## 2024-08-27 DIAGNOSIS — R278 Other lack of coordination: Secondary | ICD-10-CM

## 2024-08-27 DIAGNOSIS — M6281 Muscle weakness (generalized): Secondary | ICD-10-CM

## 2024-08-27 DIAGNOSIS — R262 Difficulty in walking, not elsewhere classified: Secondary | ICD-10-CM

## 2024-08-27 DIAGNOSIS — R2689 Other abnormalities of gait and mobility: Secondary | ICD-10-CM

## 2024-08-27 DIAGNOSIS — R41840 Attention and concentration deficit: Secondary | ICD-10-CM

## 2024-08-27 DIAGNOSIS — R2681 Unsteadiness on feet: Secondary | ICD-10-CM

## 2024-08-27 DIAGNOSIS — R41842 Visuospatial deficit: Secondary | ICD-10-CM

## 2024-08-27 DIAGNOSIS — R293 Abnormal posture: Secondary | ICD-10-CM

## 2024-08-27 NOTE — Therapy (Addendum)
 " OUTPATIENT PHYSICAL THERAPY NEURO TREATMENT   Patient Name: Richard [name] MRN: [id number] DOB:[birth year], 69 y.o., male Today's Date: 08/27/2024  PCP: Ezekiel Charleston, MD REFERRING PROVIDER: Wynne Flatten, MD    END OF SESSION:  PT End of Session - 08/27/24 0933     Visit Number 23    Number of Visits 27   re-cert   Date for Recertification  98/69/73   re-cert; due to potential delay in scheduling   Authorization Type [facility]    Authorization Time Period 08/01/24-11/29/24    Authorization - Visit Number 8    Authorization - Number of Visits 15    Progress Note Due on Visit 20    PT Start Time 0933   from OT session   PT Stop Time 1015    PT Time Calculation (min) 42 min    Equipment Utilized During Treatment Gait belt    Activity Tolerance Patient tolerated treatment well    Behavior During Therapy WFL for tasks assessed/performed                   Past Medical History:  Diagnosis Date   Coronary artery disease    a. cath 04/02/2014 3v dx 80-90% in D1, 80-90% mid LAD, 95% apical LAD, 95% distal inferior LV branch of LCx, total occlusion of prox RCA  b). Cath 04/03/2014 DES to mid LAD, DES x3 to prox D1, DAPT indefinitely. LCx lesion untreated   GERD (gastroesophageal reflux disease)    HIV (human immunodeficiency virus infection) (HCC)    Hypertension    Ischemic cardiomyopathy 08/09/2013   EF 20-25 percent by echo in 04/2014, s/p [employer] model [id number] serial number [id number]   Stroke [facility])    Past Surgical History:  Procedure Laterality Date   CARDIAC CATHETERIZATION  04/02/2014   DR BURNARD   ESOPHAGOGASTRODUODENOSCOPY (EGD) WITH PROPOFOL  N/A 09/08/2018   Procedure: ESOPHAGOGASTRODUODENOSCOPY (EGD) WITH PROPOFOL ;  Surgeon: Rollin Dover, MD;  Location: [facility] END[facility];  Service: Endoscopy;  Laterality: N/A;   IMPLANTABLE CARDIOVERTER DEFIBRILLATOR IMPLANT N/A 09/10/2014   Procedure: IMPLANTABLE CARDIOVERTER DEFIBRILLATOR IMPLANT;  Surgeon: Jerel Balding, MD;  [facility] model [id number] serial number [id number]   IR CT HEAD LTD  12/01/2023   IR PERCUTANEOUS ART THROMBECTOMY/INFUSION INTRACRANIAL INC DIAG ANGIO  12/01/2023   IR US  GUIDE VASC ACCESS RIGHT  12/01/2023   LEFT AND RIGHT HEART CATHETERIZATION WITH CORONARY ANGIOGRAM N/A 04/02/2014   Procedure: LEFT AND RIGHT HEART CATHETERIZATION WITH CORONARY ANGIOGRAM;  Surgeon: Debby DELENA Burnard, MD;  Location: [facility] CATH LAB;  Service: Cardiovascular;  Laterality: N/A;   MEDIAL COLLATERAL LIGAMENT REPAIR, KNEE Right 08/25/2015   Procedure: RIGHT THUMB RADICAL COLLATERAL LIGAMENT REPAIR;  Surgeon: Donnice Robinsons, MD;  Location: [facility] OR;  Service: Orthopedics;  Laterality: Right;   PERCUTANEOUS CORONARY STENT INTERVENTION (PCI-S) N/A 04/03/2014   Procedure: PERCUTANEOUS CORONARY STENT INTERVENTION (PCI-S);  Surgeon: Peter M Jordan, MD;  Location: [facility] CATH LAB;  Service: Cardiovascular;  Laterality: N/A;   RADIOLOGY WITH ANESTHESIA N/A 12/01/2023   Procedure: RADIOLOGY WITH ANESTHESIA;  Surgeon: Radiologist, Medication, MD;  Location: [facility] OR;  Service: Radiology;  Laterality: N/A;  BETHANY BRADFORD DILATION N/A 09/08/2018   Procedure: SAVORY DILATION;  Surgeon: Rollin Dover, MD;  Location: [facility] ENDOS[facility];  Service: Endoscopy;  Laterality: N/A;   Patient Active Problem List   Diagnosis Date Noted   ICD (implantable cardioverter-defibrillator) discharge 03/23/2024   Hyperlipidemia 01/02/2024   CKD stage 3a, GFR 45-59 ml/min (  HCC) 01/02/2024   Depression with anxiety 12/27/2023   Chronic systolic congestive heart failure (HCC) 12/23/2023   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   Acute ischemic right MCA stroke (HCC) 12/01/2023   Schizophrenia, unspecified (HCC) 03/07/2023   Recurrent major depression 03/07/2023   Pseudophakia of left eye 03/07/2023   Acid reflux 03/07/2023   Dyspepsia 03/07/2023   Anxiety state 03/07/2023   Cortical age-related cataract of right eye  03/07/2023   Psychotic disorder (HCC) 03/07/2023   CAD (coronary artery disease) 03/07/2023   Abdominal mass 07/12/2021   Peripheral vascular disease 02/04/2021   Overweight 01/21/2020   Dysphagia 12/04/2018   Hiatal hernia 04/19/2018   Gastroesophageal reflux disease 04/03/2018   Ischemic congestive cardiomyopathy (HCC) 10/17/2017   Generalized anxiety disorder 10/17/2017   Prediabetes 10/16/2017   Acquired thrombocytopenia 10/16/2017   Chronic kidney disease, stage 3 (HCC) 10/16/2017   Primary erectile dysfunction 10/13/2017   Chronic combined systolic and diastolic heart failure (HCC) 12/30/2015   Traumatic rupture of collateral ligament of other finger at metacarpophalangeal and interphalangeal joint, subsequent encounter 10/30/2015   Traumatic rupture of collateral ligament of other finger at metacarpophalangeal and interphalangeal joint, initial encounter 08/12/2015   VT (ventricular tachycardia) (HCC) 12/31/2014   ICD (implantable cardioverter-defibrillator) in place 09/10/2014   At risk for sudden cardiac death 2014-09-14   Systolic heart failure (HCC) 08/09/2014   Coronary artery disease involving native coronary artery of native heart without angina pectoris 04/04/2014   Unstable angina (HCC) 04/03/2014   Cardiomyopathy, ischemic 03/22/2014   History of CVA (cerebrovascular accident) 02/20/2014   Human immunodeficiency virus infection (HCC) 02/20/2014   Essential hypertension 02/20/2014   Hyperlipidemia LDL goal <70 02/20/2014   Depression 02/20/2014   Cerebral infarction (HCC) 08/09/2006    ONSET DATE: 05/15/24 referral    REFERRING DIAG: I63.9 (ICD-10-CM) - Cerebral infarction, unspecified   THERAPY DIAG:  Muscle weakness (generalized)  Unsteadiness on feet  Abnormal posture  Difficulty in walking, not elsewhere classified  Other abnormalities of gait and mobility  Rationale for Evaluation and Treatment: Rehabilitation  SUBJECTIVE:                                                                                                                                                                                              SUBJECTIVE STATEMENT:  Pt denies any acute changes since last visit. Pt got to spend time with his grandchildren over the weekend which was enjoyable. Pt's wife brings in another pair of shoes that he has with no toe cap to try.  Pt accompanied by: significant other, wife Veronica  PERTINENT HISTORY: CAD,  GERD, HIV, HTN, ischemic cardiomyopathy, CVA  PAIN:  Are you having pain? No  PRECAUTIONS: Fall and ICD/Pacemaker; L hemi   PATIENT GOALS: to walk and use my hand   OBJECTIVE:  Note: Objective measures were completed at Evaluation unless otherwise noted.  DIAGNOSTIC FINDINGS: 12/01/23 head CT IMPRESSION: 1. Subtle hypoattenuation involving the right caudate and right lentiform nuclei which may reflect acute infarct. 2. No acute intracranial hemorrhage. 3. Remote infarct involving the right frontal operculum, insula, and superior right temporal lobe. 4. Asymmetric density of the right M1 segment. Recommend correlation with CTA. 5. ASPECTS is 8                                                                                                              TREATMENT   Self-Care There were no vitals filed for this visit.   NMR: To work on LE strengthening with weight shift to the L and increased WB through LLE: At ballet bar with RUE support RLE 4 step-taps x 15 reps Manual assist for weight shift onto LLE and for L quad activation in stance   Gait STAIRS:  Level of Assistance: Mod A  Stair Negotiation Technique: Step to Pattern with Single Rail on Right  Number of Stairs: 12   Height of Stairs: 6  Comments: pt circumducts with LLE, needs manual assist to descend with LLE to widen BOS and for foot placement and tactile cueing for L quad activation in stance  GAIT: Gait pattern: decreased hip/knee flexion-  Left, decreased ankle dorsiflexion- Left, circumduction- Left, and lateral lean- Right Distance walked: 230 ft to the L around the track + various other clinic distances during session Assistive device utilized: Quad cane large base Level of assistance: CGA with min A for lateral weight shift to the L Comments: with L AFO; fatigues during 2nd lap with decreased gait speed, some scissoring with LLE, narrow BOS, decreased step length LLE   Tried new tennis shoes, had to cut tongue to fit LLE into shoe with AFO, shoes do not have toecap, but pt does well clearing L foot and no significant increase in gait deviations as compared to his regular shoes.    PATIENT EDUCATION: Education details: continue HEP, PT POC with plan to potentially add more visits Person educated: Patient and Spouse Education method: Explanation, Demonstration, Tactile cues, and Verbal cues Education comprehension: verbalized understanding, returned demonstration, verbal cues required, tactile cues required, and needs further education  HOME EXERCISE PROGRAM: Access Code: [email or web address] URL: [email or web address] Date: 06/06/2024 Prepared by: Delon Pop  Exercises - Supine Bridge  - 1 x daily - 7 x weekly - 3 sets - 10 reps - Supine March with Posterior Pelvic Tilt  - 1 x daily - 7 x weekly - 3 sets - 10 reps - Mini Squat with Counter Support  - 1 x daily - 7 x weekly - 3 sets - 8-10 reps - Standing Knee Flexion with Counter Support  - 1 x daily - 7 x weekly - 3 sets - 6-8 reps -  Standing March with Counter Support  - 1 x daily - 7 x weekly - 3 sets - 8-10 reps - Staggered Sit-to-Stand  - 1 x daily - 7 x weekly - 3 sets - 10 reps - Side Stepping with Counter Support  - 1 x daily - 7 x weekly - 3 sets - 10 reps - Mini Squat with Counter Support  - 1 x daily - 7 x weekly - 3 sets - 10 reps  GOALS: Goals reviewed with patient? Yes  SHORT TERM GOALS: Target date: 06/22/24  Pt will be independent with  initial HEP for improved functional strength and balance  Baseline: to be provided  Goal status: MET  2.  Pt will improve gait speed to >/= .37m/s to demonstrate improved community ambulation  Baseline: .71m/s with HW + CGA; .97m/s with LBQC + CGA Goal status: MET  3.  Pt will improve TUG to </= 70 secs to demonstrated reduced fall risk  Baseline: 98s with HW + MinA; 79s LBQC + CGA Goal status: IN PROGRESS  4.  Pt will improve 5x STS to </= 22 sec to demo improved functional LE strength and balance   Baseline: 30.81 with CGA + R UE; 24/5s no UE + CGA Goal status: IN PRPOGRESS   LONG TERM GOALS: Target date: 07/13/24 (to match cert date)   Pt will be independent with final HEP for improved functional strength and balance  Baseline: to be provided; provided Goal status: MET  Pt will improve gait speed to >/= .40m/s to demonstrate improved community ambulation  Baseline: .40m/s with HW + CGA; .38m/s LBQC + CGA Goal status: IN PROGRESS  Pt will improve TUG to </= 50 secs to demonstrated reduced fall risk  Baseline: 98s with HW + MinA; 66s LBQC + CGA Goal status: IN PROGRESS  Pt will improve 5x STS to </= 15 sec to demo improved functional LE strength and balance   Baseline: 30.81 with CGA + R UE; 24s R UE + CGA Goal status: IN PROGRESS  NEW SHORT TERM GOALS:       08/10/24  Pt will be compliant with updated HEP for improved functional strength and balance    Baseline: to be updated   Goal status: MET  2. Pt will improve gait speed to >/= .17m/s to demonstrate improved community ambulation Baseline: .21m/s LBQC + CGA, 0.27m/s LBQC and CGA (1/5) Goal status: IN PROGRESS  3. Pt will improve TUG to </= 50 secs to demonstrated reduced fall risk Baseline: 66s LBQC + CGA, 56.91 sec LBQC and CGA (1/5) Goal status: IN PROGRESS  4. Pt will improve 5x STS to </= 20 sec to demo improved functional LE strength and balance  Baseline:  24s R UE + CGA, 21 sec RUE and CGA (1/5) Goal  status: IN PROGRESS   NEW LONG TERM GOALS: 09/07/24  Pt will be compliant with updated final HEP for improved functional strength and balance    Baseline: to be updated   Goal status: NEW  2. Pt will improve gait speed to >/= .74m/s to demonstrate improved community ambulation Baseline: .17m/s with HW + CGA, 0.16m/s LBQC and CGA (1/5) Goal status: REVISED/DOWNGRADED  3. Pt will improve TUG to </= 50 secs to demonstrated reduced fall risk Baseline: 66s LBQC + CGA, 56.91 sec LBQC and CGA (1/5) Goal status: REVISED/DOWNGRADED  4. Pt will improve 5x STS to </= 18 sec to demo improved functional LE strength and balance  Baseline: 24s R UE +  CGA, 21 sec RUE and CGA (1/5) Goal status: REVISED/DOWNGRADED   ASSESSMENT:  CLINICAL IMPRESSION: Emphasis of skilled PT session on continuing to work on gait training with [facility] with new shoes this visit, working on stair navigation, and continuing to work on PITNEY BOWES with increased weight shift to the L and WB on LLE. He continues to exhibit gait deviations as noted above and continues to benefit from practice of this. He does continue to need physical assist with stair navigation and for increased weight shift onto his LLE in stance. He continues to benefit from skilled PT services to work on LLE NMR as well as to work towards increased safety and independence with his functional mobility. Continue POC.    OBJECTIVE IMPAIRMENTS: Abnormal gait, decreased balance, decreased coordination, decreased endurance, decreased knowledge of condition, decreased knowledge of use of DME, decreased ROM, decreased strength, impaired tone, impaired UE functional use, and postural dysfunction.   ACTIVITY LIMITATIONS: carrying, lifting, bending, standing, squatting, stairs, transfers, bed mobility, locomotion level, and caring for others  PARTICIPATION LIMITATIONS: meal prep, cleaning, interpersonal relationship, driving, shopping, community activity, occupation, and yard  work  PERSONAL FACTORS: Age, Fitness, Past/current experiences, Time since onset of injury/illness/exacerbation, Transportation, and 3+ comorbidities: see above are also affecting patient's functional outcome.   REHAB POTENTIAL: Fair time since onset  CLINICAL DECISION MAKING: Stable/uncomplicated  EVALUATION COMPLEXITY: Low  PLAN:  PT FREQUENCY: 2x/week  PT DURATION: other: 7 weeks  PLANNED INTERVENTIONS: [id number]- PT Re-evaluation, 97750- Physical Performance Testing, 97110-Therapeutic exercises, 97530- Therapeutic activity, [id number]- Neuromuscular re-education, 97535- Self Care, [id number]- Manual therapy, [id number]- Gait training, [phone]- Orthotic Initial, [phone]- Orthotic/Prosthetic subsequent, [phone]- Aquatic Therapy, [phone] (1-2 muscles), [id number] (3+ muscles)- Dry Needling, Patient/Family education, Balance training, Stair training, Taping, Vestibular training, Visual/preceptual remediation/compensation, DME instructions, and Wheelchair mobility training  PLAN FOR NEXT SESSION: HIGT! Continue working with [facility], continue use of L LE, weight shifting in bars with toe taps, improve L hip flexion strength. Facilitate left weight shifting during ambulation/ literally anything to get more weight on the left leg (modified SLS, R LE step ups so he has to spend more time on L, etc); staggered sit to stands, L hip abd strengthening (resisted dot taps), did they get [facility]?, tall kneel at bench on mat table, resisted LLE forward step, RLE hurdles with manual assist for LLE in stance  Add more visits beyond January? (3 for SLP, 4 for PT, OT plans to d/c)    Waddell Southgate, PT Waddell Southgate, PT, DPT, CSRS   08/27/2024, 12:30 PM        "

## 2024-08-27 NOTE — Therapy (Signed)
 " OUTPATIENT OCCUPATIONAL THERAPY NEURO TREATMENT  Patient Name: Richard [name] MRN: [id number] DOB:[birth year], 69 y.o., male Today's Date: 08/27/2024  PCP: [name], MD REFERRING PROVIDER: [name], MD  END OF SESSION:  OT End of Session - 08/27/24 0852     Visit Number 10    Number of Visits 15    Date for Recertification  09/08/24    Authorization Type VA approved 15 visits from 06/07/24 - 10/05/24    Authorization - Visit Number 10    Progress Note Due on Visit 10    OT Start Time 0847    OT Stop Time 0930    OT Time Calculation (min) 43 min    Activity Tolerance Patient tolerated treatment well    Behavior During Therapy [facility] for tasks assessed/performed           Past Medical History:  Diagnosis Date   Coronary artery disease    a. cath 04/02/2014 3v dx 80-90% in D1, 80-90% mid LAD, 95% apical LAD, 95% distal inferior LV branch of LCx, total occlusion of prox RCA  b). Cath 04/03/2014 DES to mid LAD, DES x3 to prox D1, DAPT indefinitely. LCx lesion untreated   GERD (gastroesophageal reflux disease)    HIV (human immunodeficiency virus infection) (HCC)    Hypertension    Ischemic cardiomyopathy 08/09/2013   EF 20-25 percent by echo in 04/2014, s/p [employer] [place] model [id number] serial number [id number]   Stroke [facility])    Past Surgical History:  Procedure Laterality Date   CARDIAC CATHETERIZATION  04/02/2014   DR BURNARD   ESOPHAGOGASTRODUODENOSCOPY (EGD) WITH PROPOFOL  N/A 09/08/2018   Procedure: ESOPHAGOGASTRODUODENOSCOPY (EGD) WITH PROPOFOL ;  Surgeon: Rollin Dover, MD;  Location: [facility] ENDOSCOPY;  Service: Endoscopy;  Laterality: N/A;   IMPLANTABLE CARDIOVERTER DEFIBRILLATOR IMPLANT N/A 09/10/2014   Procedure: IMPLANTABLE CARDIOVERTER DEFIBRILLATOR IMPLANT;  Surgeon: Jerel Balding, MD;  [facility] model [id number] serial number [id number]   IR CT HEAD LTD  12/01/2023   IR PERCUTANEOUS ART THROMBECTOMY/INFUSION INTRACRANIAL INC DIAG ANGIO   12/01/2023   IR US  GUIDE VASC ACCESS RIGHT  12/01/2023   LEFT AND RIGHT HEART CATHETERIZATION WITH CORONARY ANGIOGRAM N/A 04/02/2014   Procedure: LEFT AND RIGHT HEART CATHETERIZATION WITH CORONARY ANGIOGRAM;  Surgeon: Debby DELENA Burnard, MD;  Location: [facility] CATH LAB;  Service: Cardiovascular;  Laterality: N/A;   MEDIAL COLLATERAL LIGAMENT REPAIR, KNEE Right 08/25/2015   Procedure: RIGHT THUMB RADICAL COLLATERAL LIGAMENT REPAIR;  Surgeon: Donnice Robinsons, MD;  Location: [facility] OR;  Service: Orthopedics;  Laterality: Right;   PERCUTANEOUS CORONARY STENT INTERVENTION (PCI-S) N/A 04/03/2014   Procedure: PERCUTANEOUS CORONARY STENT INTERVENTION (PCI-S);  Surgeon: Peter M Jordan, MD;  Location: [facility] CATH LAB;  Service: Cardiovascular;  Laterality: N/A;   RADIOLOGY WITH ANESTHESIA N/A 12/01/2023   Procedure: RADIOLOGY WITH ANESTHESIA;  Surgeon: Radiologist, Medication, MD;  Location: [facility] OR;  Service: Radiology;  Laterality: N/A;  BETHANY BRADFORD DILATION N/A 09/08/2018   Procedure: SAVORY DILATION;  Surgeon: Rollin Dover, MD;  Location: [facility] ENDOSCOPY;  Service: Endoscopy;  Laterality: N/A;   Patient Active Problem List   Diagnosis Date Noted   ICD (implantable cardioverter-defibrillator) discharge 03/23/2024   Hyperlipidemia 01/02/2024   CKD stage 3a, GFR 45-59 ml/min (HCC) 01/02/2024   Depression with anxiety 12/27/2023   Chronic systolic congestive heart failure (HCC) 12/23/2023   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   Acute ischemic right MCA stroke (HCC) 12/01/2023   Schizophrenia, unspecified (HCC) 03/07/2023  Recurrent major depression 03/07/2023   Pseudophakia of left eye 03/07/2023   Acid reflux 03/07/2023   Dyspepsia 03/07/2023   Anxiety state 03/07/2023   Cortical age-related cataract of right eye 03/07/2023   Psychotic disorder (HCC) 03/07/2023   CAD (coronary artery disease) 03/07/2023   Abdominal mass 07/12/2021   Peripheral vascular disease 02/04/2021   Overweight 01/21/2020    Dysphagia 12/04/2018   Hiatal hernia 04/19/2018   Gastroesophageal reflux disease 04/03/2018   Ischemic congestive cardiomyopathy (HCC) 10/17/2017   Generalized anxiety disorder 10/17/2017   Prediabetes 10/16/2017   Acquired thrombocytopenia 10/16/2017   Chronic kidney disease, stage 3 (HCC) 10/16/2017   Primary erectile dysfunction 10/13/2017   Chronic combined systolic and diastolic heart failure (HCC) 12/30/2015   Traumatic rupture of collateral ligament of other finger at metacarpophalangeal and interphalangeal joint, subsequent encounter 10/30/2015   Traumatic rupture of collateral ligament of other finger at metacarpophalangeal and interphalangeal joint, initial encounter 08/12/2015   VT (ventricular tachycardia) (HCC) 12/31/2014   ICD (implantable cardioverter-defibrillator) in place 09/10/2014   At risk for sudden cardiac death 09-21-2014   Systolic heart failure (HCC) 08/09/2014   Coronary artery disease involving native coronary artery of native heart without angina pectoris 04/04/2014   Unstable angina (HCC) 04/03/2014   Cardiomyopathy, ischemic 03/22/2014   History of CVA (cerebrovascular accident) 02/20/2014   Human immunodeficiency virus infection (HCC) 02/20/2014   Essential hypertension 02/20/2014   Hyperlipidemia LDL goal <70 02/20/2014   Depression 02/20/2014   Cerebral infarction (HCC) 08/09/2006    ONSET DATE: 06/19/2024  REFERRING DIAG: I63.9 (ICD-10-CM) - Cerebral infarction, unspecified  Note:  [employer] Auth# [id number] 06/07/24 - 10/05/24 15 OT visits  THERAPY DIAG:  Other lack of coordination  Muscle weakness (generalized)  Unsteadiness on feet  Visuospatial deficit  Attention and concentration deficit  Rationale for Evaluation and Treatment: Rehabilitation  SUBJECTIVE:   SUBJECTIVE STATEMENT: No pain and no falls. Splint is staying on  Pt accompanied by: significant other (wife)   PERTINENT HISTORY: presented 12/01/23 with L-sided  weakness and slurred speech. CTA showed occlusion of the right M1 segment, proximal M2 branch of the right MCA with intraluminal thrombus. S/p mechanical thrombectomy 4/24. CT showed subtle hypoattenuation involving the right caudate and right lentiform nuclei which may reflect acute infarct. MRI pending. PMH: CAD s/p CABG x3 2015, ischemic cardiomyopathy s/p ICD placement, HTN, HLD, HIV, GERD, CVA in 2008 without residual deficits  PRECAUTIONS: Fall and ICD/Pacemaker, no driving  WEIGHT BEARING RESTRICTIONS: No  PAIN:  Are you having pain? Lt hand fluctuates 5/10  FALLS: Has patient fallen in last 6 months? Yes. Number of falls 4; slid off sofa, fell off bed, nearly fell off commode into tub    LIVING ENVIRONMENT: Lives with: lives with their spouse Lives in: one level home Stairs: Yes: External: 1 steps; on right going up Has following equipment at home: Hemi walker, Wheelchair (manual), Shower bench, bed side commode, and Grab bars   PLOF: Independent  PATIENT GOALS: get out of this wheelchair  OBJECTIVE:  Note: Objective measures were completed at Evaluation unless otherwise noted.  HAND DOMINANCE: Right  ADLs:  Eating: assist to cut food Grooming: set up for brushing gums and implants, wife brushes dentures and shaves him (wife always shaved pt even prior to CVA) UB Dressing: mod to max assist LB Dressing: max assist Toileting: mod I  Bathing: mod assist - mostly seated Tub Shower transfers: min assist for leg management on Lt  Equipment: Transfer tub bench, Grab bars,  and hand held shower  IADLs: dependent for all IADLS Handwriting: No changes  MOBILITY STATUS: uses w/c mostly, uses hemi walker some at home   UPPER EXTREMITY ROM:  RUE AROM WNL's  LUE AROM dominated by synergy pattern (goes into some sh abduction, elbow flex, pronation but no isolated movement) No active movement in hand Pt can only tolerate approx 75% passive shoulder flexion   UPPER EXTREMITY  MMT:   NT d/t spasticity   HAND FUNCTION: No functional use LT hand  COORDINATION: No functional use Lt hand  SENSATION: WFL  EDEMA: mild Lt hand  MUSCLE TONE: LUE: Moderate, Hypertonic, and Modifed Ashworth Scale 3 = Considerable increase in muscle tone, passive movement difficult at elbow, 2/4 wrist and fingers  COGNITION: Overall cognitive status: Impaired, decreased memory Wife reports he sometimes doesn't turn the sink off after washing hands  VISION: Subjective report: about the same to me. Got new glasses Baseline vision: Wears glasses all the time but not wearing at evaluation Visual history: cataract sx OU  VISION ASSESSMENT: Not tested    PERCEPTION: Not tested  PRAXIS: Not tested  OBSERVATIONS: dysarthria of speech, reports getting choked especially on water and saliva                                                                                                                           TREATMENT:  Further assessed progress towards STG's and LTG's - see below Reviewed hemi dressing techniques and A/E recommendations Reviewed memory compensatory strategies Reviewed previous HEP and discussed simple wt bearing through LUE w/ assist UBE x 5 mintues, level 1 for UE strength/endurance w/ Lt hand wrapped (backwards)   PATIENT EDUCATION: Education details: Reviewed HEP; see above Person educated: Patient and Spouse Education method: Explanation, Demonstration, and Verbal cues Education comprehension: verbalized understanding and returned demonstration  HOME EXERCISE PROGRAM: 07/09/24: Initial LUE HEP  07/24/24: memory compensation strategies   GOALS: Goals reviewed with patient? Yes  SHORT TERM GOALS: Target date: 08/09/24  Independent with HEP for LUE Baseline: Goal status: 08/20/24:  MET  2.  Independent with splint wear and care for Lt hand/wrist (resting hand splint) Baseline:  Goal status: MET  3.  Pt/family to verbalize understanding with  hemi dressing/bathing techniques and A/E to increase independence with ADLS Baseline:  Goal status: MET  4.  Pt to verbalize understanding with memory compensation strategies  Baseline:  Goal status: MET   LONG TERM GOALS: Target date: 09/08/24  Independent with updated HEP  Baseline:  Goal status: INITIAL  2.  Pt to consistently perform UB dressing and bathing with no more than min assist Baseline:  Goal status: MET   3.  Pt to use LUE as stabilizer at least 25% of the time for ADLS/bilateral tasks Baseline:  Goal status: IN PROGRESS  4.  Pt to cut food with A/E (rocker knife)  Baseline:  Goal status: MET in clinic but has not yet ordered  5.  Pt  to id 3 pain management strategies for Lt hand I'ly Baseline:  Goal status: MET    ASSESSMENT:  CLINICAL IMPRESSION: Pt is progressing towards goals with improved ADL performance per pt/wife, improved ability to wear splint, and able to return demo/verbalize understanding of HEP. Pt has met all STG's and 3/5 LTG's at this time  PERFORMANCE DEFICITS: in functional skills including ADLs, IADLs, coordination, edema, tone, ROM, strength, pain, Fine motor control, Gross motor control, mobility, body mechanics, decreased knowledge of precautions, decreased knowledge of use of DME, and UE functional use, cognitive skills including memory.   IMPAIRMENTS: are limiting patient from ADLs, IADLs, rest and sleep, leisure, and social participation.   CO-MORBIDITIES: may have co-morbidities  that affects occupational performance. Patient will benefit from skilled OT to address above impairments and improve overall function.  REHAB POTENTIAL: Good  PLAN:  OT FREQUENCY: 2x/week  OT DURATION: 8 weeks  PLANNED INTERVENTIONS: 97535 self care/ADL training, 02889 therapeutic exercise, 97530 therapeutic activity, 97112 neuromuscular re-education, 97140 manual therapy, 97113 aquatic therapy, 97018 paraffin, 02960 fluidotherapy, 97010 moist heat,  97010 cryotherapy, 97760 Orthotic Initial, 97763 Orthotic/Prosthetic subsequent, passive range of motion, functional mobility training, visual/perceptual remediation/compensation, energy conservation, patient/family education, and DME and/or AE instructions  RECOMMENDED OTHER SERVICES: none at this time  CONSULTED AND AGREED WITH PLAN OF CARE: Patient and family member/caregiver  PLAN FOR NEXT SESSION:   continue wt bearing and AA/ROM LUE and issued updated HEP, anticipate d/c by end of January  *NO estim d/t defibrillator   Burnard JINNY Roads, OT 08/27/2024, 8:53 AM           "

## 2024-08-27 NOTE — Therapy (Signed)
 " OUTPATIENT SPEECH LANGUAGE PATHOLOGY TREATMENT   Patient Name: Richard [name] MRN: [id number] DOB:[birth year], 69 y.o., male Today's Date: 08/27/2024  PCP: [name], MD REFERRING PROVIDER: [name], MD  END OF SESSION:  End of Session - 08/27/24 1016     Visit Number 9    Number of Visits 25    Date for Recertification  10/03/24    SLP Start Time 1015    SLP Stop Time  1100    SLP Time Calculation (min) 45 min    Activity Tolerance Patient tolerated treatment well              Past Medical History:  Diagnosis Date   Coronary artery disease    a. cath 04/02/2014 3v dx 80-90% in D1, 80-90% mid LAD, 95% apical LAD, 95% distal inferior LV branch of LCx, total occlusion of prox RCA  b). Cath 04/03/2014 DES to mid LAD, DES x3 to prox D1, DAPT indefinitely. LCx lesion untreated   GERD (gastroesophageal reflux disease)    HIV (human immunodeficiency virus infection) (HCC)    Hypertension    Ischemic cardiomyopathy 08/09/2013   EF 20-25 percent by echo in 04/2014, s/p [employer] [place] model [id number] serial number [id number]   Stroke [facility])    Past Surgical History:  Procedure Laterality Date   CARDIAC CATHETERIZATION  04/02/2014   DR BURNARD   ESOPHAGOGASTRODUODENOSCOPY (EGD) WITH PROPOFOL  N/A 09/08/2018   Procedure: ESOPHAGOGASTRODUODENOSCOPY (EGD) WITH PROPOFOL ;  Surgeon: Rollin Dover, MD;  Location: [facility] ENDOSCOPY;  Service: Endoscopy;  Laterality: N/A;   IMPLANTABLE CARDIOVERTER DEFIBRILLATOR IMPLANT N/A 09/10/2014   Procedure: IMPLANTABLE CARDIOVERTER DEFIBRILLATOR IMPLANT;  Surgeon: Jerel Balding, MD;  [facility] [place] model [id number] serial number [id number]   IR CT HEAD LTD  12/01/2023   IR PERCUTANEOUS ART THROMBECTOMY/INFUSION INTRACRANIAL INC DIAG ANGIO  12/01/2023   IR US  GUIDE VASC ACCESS RIGHT  12/01/2023   LEFT AND RIGHT HEART CATHETERIZATION WITH CORONARY ANGIOGRAM N/A 04/02/2014   Procedure: LEFT AND RIGHT HEART CATHETERIZATION WITH CORONARY  ANGIOGRAM;  Surgeon: Debby DELENA Burnard, MD;  Location: [facility] CATH LAB;  Service: Cardiovascular;  Laterality: N/A;   MEDIAL COLLATERAL LIGAMENT REPAIR, KNEE Right 08/25/2015   Procedure: RIGHT THUMB RADICAL COLLATERAL LIGAMENT REPAIR;  Surgeon: Donnice Robinsons, MD;  Location: [facility] OR;  Service: Orthopedics;  Laterality: Right;   PERCUTANEOUS CORONARY STENT INTERVENTION (PCI-S) N/A 04/03/2014   Procedure: PERCUTANEOUS CORONARY STENT INTERVENTION (PCI-S);  Surgeon: Peter M Jordan, MD;  Location: [facility] CATH LAB;  Service: Cardiovascular;  Laterality: N/A;   RADIOLOGY WITH ANESTHESIA N/A 12/01/2023   Procedure: RADIOLOGY WITH ANESTHESIA;  Surgeon: Radiologist, Medication, MD;  Location: [facility] OR;  Service: Radiology;  Laterality: N/A;  BETHANY BRADFORD DILATION N/A 09/08/2018   Procedure: SAVORY DILATION;  Surgeon: Rollin Dover, MD;  Location: [facility] ENDOSCOPY;  Service: Endoscopy;  Laterality: N/A;   Patient Active Problem List   Diagnosis Date Noted   ICD (implantable cardioverter-defibrillator) discharge 03/23/2024   Hyperlipidemia 01/02/2024   CKD stage 3a, GFR 45-59 ml/min (HCC) 01/02/2024   Depression with anxiety 12/27/2023   Chronic systolic congestive heart failure (HCC) 12/23/2023   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   Acute ischemic right MCA stroke (HCC) 12/01/2023   Schizophrenia, unspecified (HCC) 03/07/2023   Recurrent major depression 03/07/2023   Pseudophakia of left eye 03/07/2023   Acid reflux 03/07/2023   Dyspepsia 03/07/2023   Anxiety state 03/07/2023   Cortical age-related cataract of right eye 03/07/2023  Psychotic disorder (HCC) 03/07/2023   CAD (coronary artery disease) 03/07/2023   Abdominal mass 07/12/2021   Peripheral vascular disease 02/04/2021   Overweight 01/21/2020   Dysphagia 12/04/2018   Hiatal hernia 04/19/2018   Gastroesophageal reflux disease 04/03/2018   Ischemic congestive cardiomyopathy (HCC) 10/17/2017   Generalized anxiety disorder 10/17/2017    Prediabetes 10/16/2017   Acquired thrombocytopenia 10/16/2017   Chronic kidney disease, stage 3 (HCC) 10/16/2017   Primary erectile dysfunction 10/13/2017   Chronic combined systolic and diastolic heart failure (HCC) 12/30/2015   Traumatic rupture of collateral ligament of other finger at metacarpophalangeal and interphalangeal joint, subsequent encounter 10/30/2015   Traumatic rupture of collateral ligament of other finger at metacarpophalangeal and interphalangeal joint, initial encounter 08/12/2015   VT (ventricular tachycardia) (HCC) 12/31/2014   ICD (implantable cardioverter-defibrillator) in place 09/10/2014   At risk for sudden cardiac death September 21, 2014   Systolic heart failure (HCC) 08/09/2014   Coronary artery disease involving native coronary artery of native heart without angina pectoris 04/04/2014   Unstable angina (HCC) 04/03/2014   Cardiomyopathy, ischemic 03/22/2014   History of CVA (cerebrovascular accident) 02/20/2014   Human immunodeficiency virus infection (HCC) 02/20/2014   Essential hypertension 02/20/2014   Hyperlipidemia LDL goal <70 02/20/2014   Depression 02/20/2014   Cerebral infarction (HCC) 08/09/2006    ONSET DATE: 11/2023  REFERRING DIAG: I63.9 (ICD-10-CM) - Cerebral infarction, unspecified  THERAPY DIAG:  Cognitive communication deficit  Dysarthria and anarthria  Rationale for Evaluation and Treatment: Rehabilitation  SUBJECTIVE:   SUBJECT.I got choked this morning Pt accompanied by: self and significant other  PERTINENT HISTORY: Pt is a 69 y.o. male who has a recent hx of CVA back in April, 2025. Pt noted residual deficits include dysarthria, cognitive-communication deficits and dysphagia. Pt's wife was present and provided additional hx. Pt's wife notices that pt's speech is low in volume during conversations. Pt recently received HH ST, primarily targeting dysarthria at sentence and conversation levels. Pt also received ST in CIR  11/2023-12/2023, targeting swallowing, dysarthria, and cognition. Pt currently consumes regular/thin-liquid diet. Pt endorses coughing and choking on secretions on occasion. Pt's wife handles household responsibilities (finances, medication management, etc.). Pt's wife states that pt tends to lose focus during longer tasks. Pt enjoys watching sports at home. PMHx: CAD, GERD, HIV, HTN, ischemic cardiomyopathy, CVA.  PAIN:  Are you having pain? No  FALLS: Has patient fallen in last 6 months?  See PT evaluation for details  LIVING ENVIRONMENT: Lives with: lives with their family and lives with their spouse Lives in: House/apartment  PLOF:  Level of assistance: Comment: Independent with driving and medication management; wife still did finances  Employment: On disability  PATIENT GOALS: To improve thinking skills, swallowing, and speech.  OBJECTIVE:  Note: Objective measures were completed at Evaluation unless otherwise noted.  DIAGNOSTIC FINDINGS: MBSS from 12/08/23 Patient presents with mild oropharyngeal dysphagia. Oral phase is characterized by reduced lingual movement and labial seal resulting in anterior and posterior spillage. Patient independently utilized multiple swallows and digital sweep when necessary to clear oral residuals. Pharyngeal phase is remarkable for reduced epiglottic inversion and pharyngeal stripping wave resulting in mild pharyngeal residuals, mainly present in the vallecula and pyriform sinuses. Penetration present x2 with thin liquids (x1 via straw, x1 during liquid wash), though likely due to premature spillage and mistiming of epiglottic inversion and laryngeal vestibular closure. Pill consumed in puree, requiring additional bites of PO due to suspected esophageal retention. Recommend continuation of D2/thin liquid diet with small, single sips  of liquids. Patient should continue to utilize digital sweep to clear buccal pocketing of solids. Recommend medications  administered whole in puree with additional bites to promote esophogeal clearance.     COGNITION: Overall cognitive status: Impaired Areas of impairment:  Attention: Impaired: Selective Memory: Impaired: Immediate Working Short term Psychologist, Educational function: Impaired: Impulse control, Organization, Planning, and Error awareness Behavior: Impulsive Functional deficits: Medication management, time management.     MOTOR SPEECH: Overall motor speech: impaired Level of impairment: Conversation Respiration: thoracic breathing and diaphragmatic/abdominal breathing Phonation: normal and low vocal intensity Resonance: WFL Articulation: Impaired: conversation Intelligibility: Intelligibility reduced Motor planning: Appears intact Motor speech errors: aware and consistent Interfering components: N/A Effective technique: increased vocal intensity and pacing  ORAL MOTOR EXAMINATION: Overall status: Impaired:   Lingual: Left (Symmetry and Strength) Comments: N/A   STANDARDIZED ASSESSMENTS: SLUMS: 20/30  PATIENT REPORTED OUTCOME MEASURES (PROM): EAT-10: 20/36 and Communication Effectiveness Survey: 11/24                                                                                                                            TREATMENT DATE:   09/06/24: Dysphagia: Richard [name] reports more consistent completion of swallow exercises - today he required occasional min verbal cue and modeling to complete Masako - verbal instruction required to not use water sip with Masako but to take a sip, hard swallow, then complete Masako 2-3x with out water sip. Masako and effortful swallow completed 15x each, alternating 2-3 Masako with sips using hard swallow. Richard [name] complete lingual strengthening exercises using tongue depressor as resistance, today he completed holding the tongue depressor independently, 10 reps of each exercise with rare min A. Spouse reports improve independence at home with HEP as  well Dysarthria, cognition: Richard [name] returns with external aid of HEP log to help recall and track completion of OT and PT HEP - He has been 100% consistent completing HEPs in the afternoon and 50% consistent completing them in am - much improved with use of HEP log. He is to to continue this this week and focus on more consistent am exercises. Targeted carryover of compensations for dysarthria in structured task with moderate cognitive load generating similarities and differences of personally relevant related words at sentence level - with occasional min verbal cues, he carried over volume and over articulation to be intelligible 4/4 word pairs. In conversation he averaged 72dB with rare min A - he reports he does not carry this over at home consistently as he gets relaxed    Early returns HEP  log with pm HEP  (PT/OT) completed consistently, am HEP 50% of days. He is to continue to use HEP log  08/22/24: Dysphagia: Targeted consistent completion of HEP for OT/PT/ST by collaborating with pt and spouse to generate routine times to completed HEPs BID. They agreed that after breakfast and after lunch he will complete HEPs. Spouse reports that Richard [name] will often say I'll do them later or give me an hour - we  reviewed that he is to complete these as the agreed times. Using mirror for feedback, Richard [name] completing lingual strengthening exercises with and without resistance 10x each with occasional min verbal cues and modeling. He completed Masko 10x (5x then a break, then 5 more) and effortful swallow 15x with occasional min A. He followed swallow precautions with rare min A for water sips 5/5x. They report left labial leakage with meals. Instructed him to use a mirror while eating to give feedback and help control leakage.  Cognition/Dysarthria: 1 episode of communication breakdown today due to dysarthria. Richard [name] required questioning cues to Id and correct breakdown by using over articulation and ensuring he  says the last sound in his words. Targeted carryover of dysarthria strategies generating sentences with multi-syllabic word with occasional min A to articulate each syllable and last sound. Volume maintained at 72-74dB with mod I. They got 2 1 week pill organizers - Shandon successfully opened the correct day and took meds this morning - he continues to require A to place pills in applesauce due to UE paresis. At conversation level, Richard [name] is maintaining WNL volume with mod I and requires usual min verbal cues to ID and correct dysarthric errors.   08/20/24: Dysphagia: Pt reports inconsistent practice since last visit. Today he completed Masako and effortful swallows 10x wit hmod I. Isometric lingual strengthening with resistance completed 10x each medial and R/L lateral positions with 3 second hold each. Provided tongue depressor to complete at home, however also educated them that the can use a spoon or hand as well. Ongoing education to swallow more frequently, especially when laying down to reduce choking on saliva. Instructed them to request referral to ENT for throat discomfort and dysphagia.   Cognition/Dysarthria: Spouse has placed sign to help Amiel remember to turn off the water in the bathroom. He has not forgotten since the sign has been placed. They have not posted a new calendar for January as of yet - will work on this this week. Targeted recall of meds and use of pill organizer. Their pill organizer is large for 4x daily. With verbal cues, Richard [name] identified and opened days and times with rare min A. He will need to be able to turn box over to retrieve pills - Spouse generated strategy of getting 2 pill boxes, 1 for day and 1 for night as he only takes meds twice a day the 4x a day pill box is harder to manage.   08/16/23: Dysphagia: Pt has been practicing HEP regularly at home. Today he completed pharyngeal strengthening exercises for optimizing pt's swallow safety and efficiency. Effortful  swallows and Masako were completed with 100% consistency given rare min A. Pt completed effortful swallows 20x and Masako 5x. SLP challenged pt to complete lingual strengthening (isometric) exercises in the medial and right/left lateral positions. Pt performed medial and lateral lingual exercises with 100% consistency and completed 8 reps/5x for each position.  Plan is to continue pharyngeal/lingual swallow exercises.  Cognition: Pt enters with some carryover of dysarthria strategies (over-articulation and increased volume) requiring occasional min A during structured tasks. SLP introduced memory compensations for optimizing pt's short-term memory recall at home. Pt notes that he forgets to turn the sink and lights off at home. SLP educated pt and pt's wife in using visual cues for improving pt's short-term memory recall and attention to objects in his environment. Pt states that he has forgotten his walker in the bathroom twice recently. SLP encouraged pt to stop, look at his surroundings  before proceeding to leave the bathroom to support attention for remembering his walker to optimize safety awareness. SLP inquired pt about his current use of a calendar and other external memory supports at home. Pt's wife notes that they have a new 2026 calendar that has not been put up yet. SLP educated pt in using external memory supports for increasing pt independence with his short-term memory recall, reducing caregiver burden. SLP provided pt a temporary January, 2026 calendar and had pt identify the current date and an upcoming date for pt's favorite sports teams. Pt was successful with identifying the current date but required min verbal A to identify an upcoming sports game due to pt's selective attention/slight impulsivity. Pt is challenged to use calendar at home to write down pertinent appointments/information for optimizing short-term memory recall. SLP provided pt memory compensations sheet to utilize for  incorporating more short-term memory strategies at home. Pt and pt's wife are agreeable to trying new memory compensations. Plan is to continue dysarthria tx and building cognitive-communication support systems. Pt was asked to bring 2 of his current medications to practice medication management in upcoming session.   08/14/23: Dysphagia: Pt has been practicing HEP regularly at home. Today he completed pharyngeal strengthening exercises for optimizing pt's swallow safety and efficiency. Effortful swallows and Masako were completed with 100% consistency given rare min A. Pt completed effortful swallows 20x and Masako 10x. Pt notes that his throat feels more irritated after performing pharyngeal strengthening exercises. Pt's wife states that their doctor wanted to inform SLP about getting an Endoscopy exam performed for checking throat health/swallowing. SLP educated family about importance of objective instrumental testing for examining throat health and swallow function. Plan is to continue pharyngeal swallow exercises and SLP to f/u with pt's PCP about pursuing an objective examination of swallowing, specifically FEES.   Dysarthria: Pt enters with some carryover of dysarthria strategies (over-articulation and increased volume). Pt engaged in structured description task, where he was required to use dysarthria strategies for responses. Pt utilized dysarthria strategies suring this task in 90% of opportunities given occasional min A. SLP engaged pt in structured conversation, while utilizing dual-task training for challenging pt's selective attention to using dysarthria strategies. Pt used dysarthria strategies with 85% consistency during structured conversation given occasional min A. Pt displayed adequate use of dysarthria strategies during dual-task challenge. Pt is making progress with speech goals. Plan is to continue structured conversation exercises for generalizing dysarthria strategies into  conversation-level speech production.   07/30/24: Dysphagia: Pt has been practicing HEP regularly at home. Today he completed lingual resistance exercises and pharyngeal strengthening exercises for optimizing pt's swallow safety and efficiency. Lingual medialization with resistance completed 10/10x with occasional min modeling and verbal cues. Lingual elevation and lateralization with resistance were each completed 10/10x with occasional min A for modeling. Effortful swallows and Masako each completed 10/10x with occasional min A with breaks given every 3 reps of Masako. Trained in strategy of educating family not to talk to you when you are eating over the holidays, as well as using intent to chew and swallow even with guests or group meals. Spouse verbalizes she will cue him for this as well. Dysarthria: Richard [name] enters without carryover of strategies for intelligible speech. Reviewed these, targeted carryover repeating and generating sentences with 4-5 syllable words - Addam required usual mod verbal cues and modeling initially for over articulation and saying each syllable. In structured description task, Richard [name] carried over slow rate, volume and over articulation 8/10 phrases with occasional  min A. Initiated training in speaking and swallowing (and walking) with intent - Richard [name] and his spouse verbalized understanding. Spouse gave an example of when she interrupted Nathaneal as he was getting up from the table and he had a near fall as he lost attention and intent when standing.    07/24/24: Dysphagia: Pt has been practicing HEP regularly at home. SLP reviewed lingual resistance exercises and pharyngeal strengthening exercises for optimizing pt's swallow safety and efficiency. Lingual medialization with resistance completed 10/10x with occasional min modeling and verbal cues. Lingual elevation and lateralization with resistance were each completed 5/5x with occasional min A for modeling. Effortful swallows and  Masako each completed 10/10x with occasional min A. Pt notes that he continues to experiencing increased coughing with saliva at night-time. SLP educated pt about practicing swallow HEP at night before bed to improve secretion management for saliva and reduce coughing. Pt is agreeable andwill try practicing HEP at night. Plan is to continue lingual and pharyngeal strengthening exercises for optimizing swallow safety and efficiency for QOL. Dysarthria: Targeted compensations for dysarthria (over-articulation, increased loudness) in sentence readings. SLP encouraged pt to utilize swallows before speaking to reduce choking on saliva during speech. Pt utilize a swallow prior to speaking in 90% of opportunities given occasional min verbal A. During 5-7 word sentence readings, pt utilized dysarthria compensations in 90% of opportunities given occasional min verbal A. SLP challenged pt to utilize dysarthria compensations during relevant phrases (personal information). Pt continued to utilize compensations with 90% consistency given occasional min A. Pt is making progress with speech and swallow goals. SLP provided pt homework focused on /s/ sound production in multi-syllabic words and functional phrases; SLP encouraged pt to continue utilizing over-articulation during HEP for maximizing speech intelligibility. Plan is to continue sentence readings and functional phrase readings for improving pt use of dysarthria compensations.   07/16/24: Dysphagia: Initiated HEP for dysphagia and lingual weakness. See Patient Instructions. Lingual elevation and lateralization with resistance exercises completed 15/15x with occasional min modeling and verbal cues. Effortful swallows and Masako completed 10/10x with occasional min A. Targeted more frequent swallowing and swallowing prior to speaking to reduce choking on saliva. With usual verbal cues, Richard [name] demonstrated swallow prior to speaking on structured speech task. He required  frequent cues to swallow prior to speaking in conversation. Introduced concept of swallowing with intent, paying attention when eating and drinking  Dysarthria: Targeted compensations for dysarthria in structured speech task generating 3 sentence descriptions - Volume remained WNL with rare min A. Slow rate, over articulation and separating syllables required occasional min modeling and verbal cues. Richard [name] required cueing to ID and correct slurred/unintelligible utterances 3/3x initially. Targeted 3 syllable words - using separate syllables at slow rate to compensate for dysarthria generating sentences with these words -After initial modeling and instruction, Richard [name] was intelligible in sentences with 3 syllable words 12/12x. Generated list of personally relevant phrases to practice including biographical information he uses with doctors' offices, and most frequent meal orders. At the end of session, Richard [name] Id'd and self correct imprecise speech in conversation with spouse with mod I.  07/11/24: Evaluation completed. No tx was conducted on this date.    PATIENT EDUCATION: Education details: Evaluation results/goal setting. Person educated: Patient and Spouse Education method: Explanation Education comprehension: verbalized understanding   GOALS: Goals reviewed with patient? Yes  SHORT TERM GOALS: Target date: 08/15/2024  Pt will achieve average >70 dB during 8 minute conversation given occasional min A. Baseline: Goal status: MET  2.  Pt will perform pharyngeal and lingual strengthening exercises with 90% consistency when given rare min A. Baseline:  Goal status: MET  3.  Pt and pt's family will utilize at least 1 external memory strategy for maximizing attention during medication management given occasional min A.  Baseline:  Goal status: MET  4.  Pt will display use of aspiration precautions during PO trials of regular/thin-liquids in 90% of opportunities given rare min A Baseline:   Goal status: MET  5.  Pt will achieve 95-97% speech intelligibility during 8 minute conversation with familiar listeners given occasional min A.  Baseline:  Goal status: MET   LONG TERM GOALS: Target date: 10/03/2024  Pt will achieve average >70 dB during a 12 minute conversation given rare min A.  Baseline:  Goal status: ONGOING  2.  Pt will reduce score on EAT-10 PROM, indicating improved self-perception of swallowing function.  Baseline: 20/36 Goal status: ONGOING  3.  Pt and pt's family will describe and demonstrate at least 2 memory strategies for optimizing pt independence with certain ADL tasks (medication management) given rare min A. Baseline:  Goal status: ONGOING  4.  Pt will improve score on Communicative Effectiveness Survey, indicating speech is optimal for successful communication in different contexts.  Baseline: 11/24 Goal status: ONGOING   ASSESSMENT:  CLINICAL IMPRESSION: Patient is a 69 y.o. male who was seen today for evaluation of motor speech, dysphagia, and cognition. Pt presents with mild-moderate dysarthria characterized by consistent, imprecise articulation and reduced vocal loudness at the conversational level. Pt also presents with mild-moderate cognitive communication deficits primarily characterized by short-term memory impairment, executive function deficits, and impulsive/inattentive behavior. Pt has hx of oropharyngeal dysphagia and currently still experience difficulty with swallowing. According to EAT-10, pt notes that he experiences difficulty with both solids and liquids during swallowing. Recommend ST targeting conversation-level dysarthria tx and cognitive-communication tx focused on establishing external memory aids for optimizing pt communication effectiveness, safety, and independence. In addition, recommend swallow tx focused on pharyngeal strengthening exercises and aspiration precaution strategies to improve pt perception of swallowing  difficulties.   OBJECTIVE IMPAIRMENTS: include attention, memory, executive functioning, dysarthria, and dysphagia. These impairments are limiting patient from managing medications, ADLs/IADLs, effectively communicating at home and in community, and safety when swallowing. Factors affecting potential to achieve goals and functional outcome are N/A. Patient will benefit from skilled SLP services to address above impairments and improve overall function.  REHAB POTENTIAL: Good  PLAN:  SLP FREQUENCY: 2x/week  SLP DURATION: 12 weeks  PLANNED INTERVENTIONS: Aspiration precaution training, Pharyngeal strengthening exercises, Internal/external aids, Functional tasks, SLP instruction and feedback, Patient/family education, [phone] Treatment of speech (30 or 45 min) , and [id number] Treatment of swallowing function   Iyani Dresner Ms, CCC-SLP 08/27/2024, 12:05 PM      "

## 2024-08-29 ENCOUNTER — Ambulatory Visit: Admitting: Occupational Therapy

## 2024-08-29 ENCOUNTER — Ambulatory Visit: Admitting: Speech Pathology

## 2024-08-29 ENCOUNTER — Ambulatory Visit: Admitting: Physical Therapy

## 2024-08-29 VITALS — BP 103/62 | HR 51

## 2024-08-29 DIAGNOSIS — R2681 Unsteadiness on feet: Secondary | ICD-10-CM

## 2024-08-29 DIAGNOSIS — R293 Abnormal posture: Secondary | ICD-10-CM

## 2024-08-29 DIAGNOSIS — R41842 Visuospatial deficit: Secondary | ICD-10-CM

## 2024-08-29 DIAGNOSIS — R278 Other lack of coordination: Secondary | ICD-10-CM

## 2024-08-29 DIAGNOSIS — R29818 Other symptoms and signs involving the nervous system: Secondary | ICD-10-CM

## 2024-08-29 DIAGNOSIS — R1312 Dysphagia, oropharyngeal phase: Secondary | ICD-10-CM

## 2024-08-29 DIAGNOSIS — R471 Dysarthria and anarthria: Secondary | ICD-10-CM

## 2024-08-29 DIAGNOSIS — M6281 Muscle weakness (generalized): Secondary | ICD-10-CM

## 2024-08-29 DIAGNOSIS — R2689 Other abnormalities of gait and mobility: Secondary | ICD-10-CM

## 2024-08-29 DIAGNOSIS — R262 Difficulty in walking, not elsewhere classified: Secondary | ICD-10-CM

## 2024-08-29 DIAGNOSIS — R41841 Cognitive communication deficit: Secondary | ICD-10-CM

## 2024-08-29 NOTE — Patient Instructions (Signed)
" °  Lateral Weight Shift: Upper Trunk Leading   Sit with feet flat on floor. Bring __Lt__ shoulder, head and arm toward side until forearm/elbow just touches sitting surface. Hold __10__ seconds. Return to upright position by pushing through arm Repeat _10___ times per session. Do __2__ sessions per day.   Forward Upper Body Weight Shift   Place both hands on a chair or stool in front. Lean body forward, lift bottom off seat (squat position) and rock forwards and backwards, and side to side slowly, then return to upright seated position. Try to shift more weight onto involved arm. Place shelf liner down, then upside down bowls over shelf liner - fingers allowed to curve over edge of bowl Repeat __5-10__ times. Do _2___ sessions per day.  **Can progress to shifting weight to Lt side then lifting up Rt arm forward and across to Lt side slightly for arm lifts  SITTING: Lateral Weight Shift     Sit upright, place Lt hand on sitting surface. Lean across Lt side and allow elbow to bend slightly (you can reach for cups with Rt hand, then place to far Rt and slightly back side), keep back straight. Hold _5__ seconds. _10__ reps per set, _2__ sets per day.  Place shelf liner under hand if wrist range of motion is limited.    SELF ASSISTED WITH OBJECT: Shoulder Flexion / Elbow Extension (Frame)    Keep trunk straight, bend and straighten elbow to move tilted stool backward and forward slowly. Focus on controlled movements _10__ reps per set, __2_ sets per day,  "

## 2024-08-29 NOTE — Therapy (Signed)
 " OUTPATIENT OCCUPATIONAL THERAPY NEURO TREATMENT  Patient Name: Richard [name] MRN: [id number] DOB:10-13-1955, 69 y.o., male Today's Date: 08/29/2024  PCP: [name], MD REFERRING PROVIDER: [name], MD  END OF SESSION:  OT End of Session - 08/29/24 0851     Visit Number 11    Number of Visits 15    Date for Recertification  09/08/24    Authorization Type VA approved 15 visits from 06/07/24 - 10/05/24    Progress Note Due on Visit 10    OT Start Time 0848    OT Stop Time 0930    OT Time Calculation (min) 42 min    Activity Tolerance Patient tolerated treatment well    Behavior During Therapy [facility] for tasks assessed/performed           Past Medical History:  Diagnosis Date   Coronary artery disease    a. cath 04/02/2014 3v dx 80-90% in D1, 80-90% mid LAD, 95% apical LAD, 95% distal inferior LV branch of LCx, total occlusion of prox RCA  b). Cath 04/03/2014 DES to mid LAD, DES x3 to prox D1, DAPT indefinitely. LCx lesion untreated   GERD (gastroesophageal reflux disease)    HIV (human immunodeficiency virus infection) (HCC)    Hypertension    Ischemic cardiomyopathy 08/09/2013   EF 20-25 percent by echo in 04/2014, s/p [facility] model [id number] serial number [id number]   Stroke [facility])    Past Surgical History:  Procedure Laterality Date   CARDIAC CATHETERIZATION  04/02/2014   DR BURNARD   ESOPHAGOGASTRODUODENOSCOPY (EGD) WITH PROPOFOL  N/A 09/08/2018   Procedure: ESOPHAGOGASTRODUODENOSCOPY (EGD) WITH PROPOFOL ;  Surgeon: Rollin Dover, MD;  Location: [facility] END[facility]COPY;  Service: Endoscopy;  Laterality: N/A;   IMPLANTABLE CARDIOVERTER DEFIBRILLATOR IMPLANT N/A 09/10/2014   Procedure: IMPLANTABLE CARDIOVERTER DEFIBRILLATOR IMPLANT;  Surgeon: Jerel Balding, MD;  [facility] model [id number] serial number [phone]   IR CT HEAD LTD  12/01/2023   IR PERCUTANEOUS ART THROMBECTOMY/INFUSION INTRACRANIAL INC DIAG ANGIO  12/01/2023   IR US  GUIDE VASC ACCESS  RIGHT  12/01/2023   LEFT AND RIGHT HEART CATHETERIZATION WITH CORONARY ANGIOGRAM N/A 04/02/2014   Procedure: LEFT AND RIGHT HEART CATHETERIZATION WITH CORONARY ANGIOGRAM;  Surgeon: Debby DELENA Burnard, MD;  Location: [facility] CATH LAB;  Service: Cardiovascular;  Laterality: N/A;   MEDIAL COLLATERAL LIGAMENT REPAIR, KNEE Right 08/25/2015   Procedure: RIGHT THUMB RADICAL COLLATERAL LIGAMENT REPAIR;  Surgeon: Donnice Robinsons, MD;  Location: [facility] OR;  Service: Orthopedics;  Laterality: Right;   PERCUTANEOUS CORONARY STENT INTERVENTION (PCI-S) N/A 04/03/2014   Procedure: PERCUTANEOUS CORONARY STENT INTERVENTION (PCI-S);  Surgeon: Peter M Jordan, MD;  Location: [facility] CATH LAB;  Service: Cardiovascular;  Laterality: N/A;   RADIOLOGY WITH ANESTHESIA N/A 12/01/2023   Procedure: RADIOLOGY WITH ANESTHESIA;  Surgeon: Radiologist, Medication, MD;  Location: [facility] OR;  Service: Radiology;  Laterality: N/A;  BETHANY BRADFORD DILATION N/A 09/08/2018   Procedure: SAVORY DILATION;  Surgeon: Rollin Dover, MD;  Location: [facility] ENDOSCOPY;  Service: Endoscopy;  Laterality: N/A;   Patient Active Problem List   Diagnosis Date Noted   ICD (implantable cardioverter-defibrillator) discharge 03/23/2024   Hyperlipidemia 01/02/2024   CKD stage 3a, GFR 45-59 ml/min (HCC) 01/02/2024   Depression with anxiety 12/27/2023   Chronic systolic congestive heart failure (HCC) 12/23/2023   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   Acute ischemic right MCA stroke (HCC) 12/01/2023   Schizophrenia, unspecified (HCC) 03/07/2023   Recurrent major depression 03/07/2023   Pseudophakia  of left eye 03/07/2023   Acid reflux 03/07/2023   Dyspepsia 03/07/2023   Anxiety state 03/07/2023   Cortical age-related cataract of right eye 03/07/2023   Psychotic disorder (HCC) 03/07/2023   CAD (coronary artery disease) 03/07/2023   Abdominal mass 07/12/2021   Peripheral vascular disease 02/04/2021   Overweight 01/21/2020   Dysphagia 12/04/2018   Hiatal  hernia 04/19/2018   Gastroesophageal reflux disease 04/03/2018   Ischemic congestive cardiomyopathy (HCC) 10/17/2017   Generalized anxiety disorder 10/17/2017   Prediabetes 10/16/2017   Acquired thrombocytopenia 10/16/2017   Chronic kidney disease, stage 3 (HCC) 10/16/2017   Primary erectile dysfunction 10/13/2017   Chronic combined systolic and diastolic heart failure (HCC) 12/30/2015   Traumatic rupture of collateral ligament of other finger at metacarpophalangeal and interphalangeal joint, subsequent encounter 10/30/2015   Traumatic rupture of collateral ligament of other finger at metacarpophalangeal and interphalangeal joint, initial encounter 08/12/2015   VT (ventricular tachycardia) (HCC) 12/31/2014   ICD (implantable cardioverter-defibrillator) in place 09/10/2014   At risk for sudden cardiac death September 03, 2014   Systolic heart failure (HCC) 08/09/2014   Coronary artery disease involving native coronary artery of native heart without angina pectoris 04/04/2014   Unstable angina (HCC) 04/03/2014   Cardiomyopathy, ischemic 03/22/2014   History of CVA (cerebrovascular accident) 02/20/2014   Human immunodeficiency virus infection (HCC) 02/20/2014   Essential hypertension 02/20/2014   Hyperlipidemia LDL goal <70 02/20/2014   Depression 02/20/2014   Cerebral infarction (HCC) 08/09/2006    ONSET DATE: 06/19/2024  REFERRING DIAG: I63.9 (ICD-10-CM) - Cerebral infarction, unspecified  Note:  [facility] Auth# [id number] 06/07/24 - 10/05/24 15 OT visits  THERAPY DIAG:  Unsteadiness on feet  Other lack of coordination  Visuospatial deficit  Other symptoms and signs involving the nervous system  Rationale for Evaluation and Treatment: Rehabilitation  SUBJECTIVE:   SUBJECTIVE STATEMENT: No pain and no falls.  Pt accompanied by: significant other (wife)   PERTINENT HISTORY: presented 12/01/23 with L-sided weakness and slurred speech. CTA showed occlusion of the right M1 segment,  proximal M2 branch of the right MCA with intraluminal thrombus. S/p mechanical thrombectomy 4/24. CT showed subtle hypoattenuation involving the right caudate and right lentiform nuclei which may reflect acute infarct. MRI pending. PMH: CAD s/p CABG x3 2015, ischemic cardiomyopathy s/p ICD placement, HTN, HLD, HIV, GERD, CVA in 2008 without residual deficits  PRECAUTIONS: Fall and ICD/Pacemaker, no driving  WEIGHT BEARING RESTRICTIONS: No  PAIN:  Are you having pain? Lt hand fluctuates 5/10  FALLS: Has patient fallen in last 6 months? Yes. Number of falls 4; slid off sofa, fell off bed, nearly fell off commode into tub    LIVING ENVIRONMENT: Lives with: lives with their spouse Lives in: one level home Stairs: Yes: External: 1 steps; on right going up Has following equipment at home: Hemi walker, Wheelchair (manual), Shower bench, bed side commode, and Grab bars   PLOF: Independent  PATIENT GOALS: get out of this wheelchair  OBJECTIVE:  Note: Objective measures were completed at Evaluation unless otherwise noted.  HAND DOMINANCE: Right  ADLs:  Eating: assist to cut food Grooming: set up for brushing gums and implants, wife brushes dentures and shaves him (wife always shaved pt even prior to CVA) UB Dressing: mod to max assist LB Dressing: max assist Toileting: mod I  Bathing: mod assist - mostly seated Tub Shower transfers: min assist for leg management on Lt  Equipment: Transfer tub bench, Grab bars, and hand held shower  IADLs: dependent for all IADLS Handwriting:  No changes  MOBILITY STATUS: uses w/c mostly, uses hemi walker some at home   UPPER EXTREMITY ROM:  RUE AROM WNL's  LUE AROM dominated by synergy pattern (goes into some sh abduction, elbow flex, pronation but no isolated movement) No active movement in hand Pt can only tolerate approx 75% passive shoulder flexion   UPPER EXTREMITY MMT:   NT d/t spasticity   HAND FUNCTION: No functional use LT  hand  COORDINATION: No functional use Lt hand  SENSATION: WFL  EDEMA: mild Lt hand  MUSCLE TONE: LUE: Moderate, Hypertonic, and Modifed Ashworth Scale 3 = Considerable increase in muscle tone, passive movement difficult at elbow, 2/4 wrist and fingers  COGNITION: Overall cognitive status: Impaired, decreased memory Wife reports he sometimes doesn't turn the sink off after washing hands  VISION: Subjective report: about the same to me. Got new glasses Baseline vision: Wears glasses all the time but not wearing at evaluation Visual history: cataract sx OU  VISION ASSESSMENT: Not tested    PERCEPTION: Not tested  PRAXIS: Not tested  OBSERVATIONS: dysarthria of speech, reports getting choked especially on water and saliva                                                                                                                           TREATMENT:  Pt/wife issued additional HEP for wt bearing over LUE and for AA/ROM LUE in seated position to promote neuro re-education of trunk and LUE. Reviewed extensively with patient/wife w/ cues for correct positioning and assist needed to perform safely. Pt/wife shown where and how to provided assist especially at hand, above elbow, and caregiver on Lt side of patient. See pt instructions for details   PATIENT EDUCATION: Education details: additional HEP  Person educated: Patient and Spouse Education method: Explanation, Demonstration, Tactile cues, Verbal cues, and Handouts Education comprehension: verbalized understanding, returned demonstration, verbal cues required, tactile cues required, and needs further education  HOME EXERCISE PROGRAM: 07/09/24: Initial LUE HEP  07/24/24: memory compensation strategies 08/29/24: additional HEP - assist required from caregiver (neuro re-educ through wt bearing and AA/ROM facilitation)   GOALS: Goals reviewed with patient? Yes  SHORT TERM GOALS: Target date: 08/09/24  Independent with HEP  for LUE Baseline: Goal status: 08/20/24:  MET  2.  Independent with splint wear and care for Lt hand/wrist (resting hand splint) Baseline:  Goal status: MET  3.  Pt/family to verbalize understanding with hemi dressing/bathing techniques and A/E to increase independence with ADLS Baseline:  Goal status: MET  4.  Pt to verbalize understanding with memory compensation strategies  Baseline:  Goal status: MET   LONG TERM GOALS: Target date: 09/08/24  Independent with updated HEP  Baseline:  Goal status: IN PROGRESS  2.  Pt to consistently perform UB dressing and bathing with no more than min assist Baseline:  Goal status: MET   3.  Pt to use LUE as stabilizer at least 25% of the time for ADLS/bilateral tasks  Baseline:  Goal status: IN PROGRESS  4.  Pt to cut food with A/E (rocker knife)  Baseline:  Goal status: MET in clinic but has not yet ordered  5.  Pt to id 3 pain management strategies for Lt hand I'ly Baseline:  Goal status: MET    ASSESSMENT:  CLINICAL IMPRESSION: Pt is progressing towards goals with improved ADL performance per pt/wife, improved ability to wear splint, and able to return demo/verbalize understanding of HEP. Pt has met all STG's and 3/5 LTG's at this time  PERFORMANCE DEFICITS: in functional skills including ADLs, IADLs, coordination, edema, tone, ROM, strength, pain, Fine motor control, Gross motor control, mobility, body mechanics, decreased knowledge of precautions, decreased knowledge of use of DME, and UE functional use, cognitive skills including memory.   IMPAIRMENTS: are limiting patient from ADLs, IADLs, rest and sleep, leisure, and social participation.   CO-MORBIDITIES: may have co-morbidities  that affects occupational performance. Patient will benefit from skilled OT to address above impairments and improve overall function.  REHAB POTENTIAL: Good  PLAN:  OT FREQUENCY: 2x/week  OT DURATION: 8 weeks  PLANNED INTERVENTIONS: 97535  self care/ADL training, 02889 therapeutic exercise, 97530 therapeutic activity, 97112 neuromuscular re-education, 97140 manual therapy, 97113 aquatic therapy, 97018 paraffin, 02960 fluidotherapy, 97010 moist heat, 97010 cryotherapy, 97760 Orthotic Initial, 97763 Orthotic/Prosthetic subsequent, passive range of motion, functional mobility training, visual/perceptual remediation/compensation, energy conservation, patient/family education, and DME and/or AE instructions  RECOMMENDED OTHER SERVICES: none at this time  CONSULTED AND AGREED WITH PLAN OF CARE: Patient and family member/caregiver  PLAN FOR NEXT SESSION:   continue wt bearing and AA/ROM LUE, anticipate d/c by end of January  *NO estim d/t defibrillator   Burnard JINNY Roads, OT 08/29/2024, 8:52 AM           "

## 2024-08-29 NOTE — Therapy (Signed)
 " OUTPATIENT SPEECH LANGUAGE PATHOLOGY TREATMENT   Patient Name: Richard [name] MRN: [id number] DOB:Oct 08, 1955, 69 y.o., male Today's Date: 08/29/2024  PCP: [name], MD REFERRING PROVIDER: [name], MD  END OF SESSION:  End of Session - 08/29/24 0937     Visit Number 10    Number of Visits 25    Date for Recertification  10/03/24    SLP Start Time 0930              Past Medical History:  Diagnosis Date   Coronary artery disease    a. cath 04/02/2014 3v dx 80-90% in D1, 80-90% mid LAD, 95% apical LAD, 95% distal inferior LV branch of LCx, total occlusion of prox RCA  b). Cath 04/03/2014 DES to mid LAD, DES x3 to prox D1, DAPT indefinitely. LCx lesion untreated   GERD (gastroesophageal reflux disease)    HIV (human immunodeficiency virus infection) (HCC)    Hypertension    Ischemic cardiomyopathy 08/09/2013   EF 20-25 percent by echo in 04/2014, s/p [employer] [place] model [id number] serial number [id number]   Stroke [facility])    Past Surgical History:  Procedure Laterality Date   CARDIAC CATHETERIZATION  04/02/2014   DR BURNARD   ESOPHAGOGASTRODUODENOSCOPY (EGD) WITH PROPOFOL  N/A 09/08/2018   Procedure: ESOPHAGOGASTRODUODENOSCOPY (EGD) WITH PROPOFOL ;  Surgeon: Rollin Dover, MD;  Location: [facility] ENDOSCOPY;  Service: Endoscopy;  Laterality: N/A;   IMPLANTABLE CARDIOVERTER DEFIBRILLATOR IMPLANT N/A 09/10/2014   Procedure: IMPLANTABLE CARDIOVERTER DEFIBRILLATOR IMPLANT;  Surgeon: Jerel Balding, MD;  [facility] model [phone] serial number [id number]   IR CT HEAD LTD  12/01/2023   IR PERCUTANEOUS ART THROMBECTOMY/INFUSION INTRACRANIAL INC DIAG ANGIO  12/01/2023   IR US  GUIDE VASC ACCESS RIGHT  12/01/2023   LEFT AND RIGHT HEART CATHETERIZATION WITH CORONARY ANGIOGRAM N/A 04/02/2014   Procedure: LEFT AND RIGHT HEART CATHETERIZATION WITH CORONARY ANGIOGRAM;  Surgeon: Debby DELENA Burnard, MD;  Location: [facility] CATH LAB;  Service: Cardiovascular;  Laterality: N/A;    MEDIAL COLLATERAL LIGAMENT REPAIR, KNEE Right 08/25/2015   Procedure: RIGHT THUMB RADICAL COLLATERAL LIGAMENT REPAIR;  Surgeon: Donnice Robinsons, MD;  Location: [facility] OR;  Service: Orthopedics;  Laterality: Right;   PERCUTANEOUS CORONARY STENT INTERVENTION (PCI-S) N/A 04/03/2014   Procedure: PERCUTANEOUS CORONARY STENT INTERVENTION (PCI-S);  Surgeon: Peter M Jordan, MD;  Location: [facility] CATH LAB;  Service: Cardiovascular;  Laterality: N/A;   RADIOLOGY WITH ANESTHESIA N/A 12/01/2023   Procedure: RADIOLOGY WITH ANESTHESIA;  Surgeon: Radiologist, Medication, MD;  Location: [facility] OR;  Service: Radiology;  Laterality: N/A;  BETHANY BRADFORD DILATION N/A 09/08/2018   Procedure: SAVORY DILATION;  Surgeon: Rollin Dover, MD;  Location: [facility] ENDOSCOPY;  Service: Endoscopy;  Laterality: N/A;   Patient Active Problem List   Diagnosis Date Noted   ICD (implantable cardioverter-defibrillator) discharge 03/23/2024   Hyperlipidemia 01/02/2024   CKD stage 3a, GFR 45-59 ml/min (HCC) 01/02/2024   Depression with anxiety 12/27/2023   Chronic systolic congestive heart failure (HCC) 12/23/2023   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   Acute ischemic right MCA stroke (HCC) 12/01/2023   Schizophrenia, unspecified (HCC) 03/07/2023   Recurrent major depression 03/07/2023   Pseudophakia of left eye 03/07/2023   Acid reflux 03/07/2023   Dyspepsia 03/07/2023   Anxiety state 03/07/2023   Cortical age-related cataract of right eye 03/07/2023   Psychotic disorder (HCC) 03/07/2023   CAD (coronary artery disease) 03/07/2023   Abdominal mass 07/12/2021   Peripheral vascular disease 02/04/2021   Overweight  01/21/2020   Dysphagia 12/04/2018   Hiatal hernia 04/19/2018   Gastroesophageal reflux disease 04/03/2018   Ischemic congestive cardiomyopathy (HCC) 10/17/2017   Generalized anxiety disorder 10/17/2017   Prediabetes 10/16/2017   Acquired thrombocytopenia 10/16/2017   Chronic kidney disease, stage 3 (HCC)  10/16/2017   Primary erectile dysfunction 10/13/2017   Chronic combined systolic and diastolic heart failure (HCC) 12/30/2015   Traumatic rupture of collateral ligament of other finger at metacarpophalangeal and interphalangeal joint, subsequent encounter 10/30/2015   Traumatic rupture of collateral ligament of other finger at metacarpophalangeal and interphalangeal joint, initial encounter 08/12/2015   VT (ventricular tachycardia) (HCC) 12/31/2014   ICD (implantable cardioverter-defibrillator) in place 09/10/2014   At risk for sudden cardiac death 2014-09-15   Systolic heart failure (HCC) 08/09/2014   Coronary artery disease involving native coronary artery of native heart without angina pectoris 04/04/2014   Unstable angina (HCC) 04/03/2014   Cardiomyopathy, ischemic 03/22/2014   History of CVA (cerebrovascular accident) 02/20/2014   Human immunodeficiency virus infection (HCC) 02/20/2014   Essential hypertension 02/20/2014   Hyperlipidemia LDL goal <70 02/20/2014   Depression 02/20/2014   Cerebral infarction (HCC) 08/09/2006    ONSET DATE: 11/2023  REFERRING DIAG: I63.9 (ICD-10-CM) - Cerebral infarction, unspecified  THERAPY DIAG:  Dysarthria and anarthria  Cognitive communication deficit  Dysphagia, oropharyngeal phase  Rationale for Evaluation and Treatment: Rehabilitation  SUBJECTIVE:   SUBJECT.I got choked this morning Pt accompanied by: self and significant other  PERTINENT HISTORY: Pt is a 69 y.o. male who has a recent hx of CVA back in April, 2025. Pt noted residual deficits include dysarthria, cognitive-communication deficits and dysphagia. Pt's wife was present and provided additional hx. Pt's wife notices that pt's speech is low in volume during conversations. Pt recently received HH ST, primarily targeting dysarthria at sentence and conversation levels. Pt also received ST in CIR 11/2023-12/2023, targeting swallowing, dysarthria, and cognition. Pt currently  consumes regular/thin-liquid diet. Pt endorses coughing and choking on secretions on occasion. Pt's wife handles household responsibilities (finances, medication management, etc.). Pt's wife states that pt tends to lose focus during longer tasks. Pt enjoys watching sports at home. PMHx: CAD, GERD, HIV, HTN, ischemic cardiomyopathy, CVA.  PAIN:  Are you having pain? No  FALLS: Has patient fallen in last 6 months?  See PT evaluation for details  LIVING ENVIRONMENT: Lives with: lives with their family and lives with their spouse Lives in: House/apartment  PLOF:  Level of assistance: Comment: Independent with driving and medication management; wife still did finances  Employment: On disability  PATIENT GOALS: To improve thinking skills, swallowing, and speech.  OBJECTIVE:  Note: Objective measures were completed at Evaluation unless otherwise noted.  DIAGNOSTIC FINDINGS: MBSS from 12/08/23 Patient presents with mild oropharyngeal dysphagia. Oral phase is characterized by reduced lingual movement and labial seal resulting in anterior and posterior spillage. Patient independently utilized multiple swallows and digital sweep when necessary to clear oral residuals. Pharyngeal phase is remarkable for reduced epiglottic inversion and pharyngeal stripping wave resulting in mild pharyngeal residuals, mainly present in the vallecula and pyriform sinuses. Penetration present x2 with thin liquids (x1 via straw, x1 during liquid wash), though likely due to premature spillage and mistiming of epiglottic inversion and laryngeal vestibular closure. Pill consumed in puree, requiring additional bites of PO due to suspected esophageal retention. Recommend continuation of D2/thin liquid diet with small, single sips of liquids. Patient should continue to utilize digital sweep to clear buccal pocketing of solids. Recommend medications administered whole in puree  with additional bites to promote esophogeal clearance.      COGNITION: Overall cognitive status: Impaired Areas of impairment:  Attention: Impaired: Selective Memory: Impaired: Immediate Working Short term Psychologist, Educational function: Impaired: Impulse control, Organization, Planning, and Error awareness Behavior: Impulsive Functional deficits: Medication management, time management.     MOTOR SPEECH: Overall motor speech: impaired Level of impairment: Conversation Respiration: thoracic breathing and diaphragmatic/abdominal breathing Phonation: normal and low vocal intensity Resonance: WFL Articulation: Impaired: conversation Intelligibility: Intelligibility reduced Motor planning: Appears intact Motor speech errors: aware and consistent Interfering components: N/A Effective technique: increased vocal intensity and pacing  ORAL MOTOR EXAMINATION: Overall status: Impaired:   Lingual: Left (Symmetry and Strength) Comments: N/A   STANDARDIZED ASSESSMENTS: SLUMS: 20/30  PATIENT REPORTED OUTCOME MEASURES (PROM): EAT-10: 20/36 and Communication Effectiveness Survey: 11/24                                                                                                                            TREATMENT DATE:    08/29/24: Dysphagia: Nephi and spouse report ongoing more consistent completion of HEP for dysphagia. Today, Mack completed all lingual strengthening exercises with rare min A to supervision cues. He completed Masako and effortful swallows with rare min A. One episode of talking while drinking water resulted in immediate coughing. Used this to demonstrate how distractions affect safety of swallow - pt verbalized understanding. Cognition/dysarthria: Pt continues to report remembering am meds with pill organizer, however he is not referring to his calendar as much. Today he recalled 3 upcoming appointments and today's date, indicating improvement to appointment management. Tryson entered ST not carrying over dysarthria strategies with  intelligibility at 75% and verbal cues to correct unintelligible utterances. Targeted carryover of compensatory strategies (volume, over articulation, slow rate) in structured sentence generation task with rare min A and 1 request for repetition out of 12 sentences. In structured conversation (open ended questions cards) Zakry required rare min A to carryover dysarthria strategies.   08/27/24: Dysphagia: Jerrion reports more consistent completion of swallow exercises - today he required occasional min verbal cue and modeling to complete Masako - verbal instruction required to not use water sip with Masako but to take a sip, hard swallow, then complete Masako 2-3x with out water sip. Masako and effortful swallow completed 15x each, alternating 2-3 Masako with sips using hard swallow. Elohim complete lingual strengthening exercises using tongue depressor as resistance, today he completed holding the tongue depressor independently, 10 reps of each exercise with rare min A. Spouse reports improve independence at home with HEP as well Dysarthria, cognition: Zebadiah returns with external aid of HEP log to help recall and track completion of OT and PT HEP - He has been 100% consistent completing HEPs in the afternoon and 50% consistent completing them in am - much improved with use of HEP log. He is to to continue this this week and focus on more consistent am exercises. Targeted carryover of compensations for dysarthria in structured  task with moderate cognitive load generating similarities and differences of personally relevant related words at sentence level - with occasional min verbal cues, he carried over volume and over articulation to be intelligible 4/4 word pairs. In conversation he averaged 72dB with rare min A - he reports he does not carry this over at home consistently as he gets relaxed    Hever returns HEP  log with pm HEP  (PT/OT) completed consistently, am HEP 50% of days. He is to continue to use  HEP log  08/22/24: Dysphagia: Targeted consistent completion of HEP for OT/PT/ST by collaborating with pt and spouse to generate routine times to completed HEPs BID. They agreed that after breakfast and after lunch he will complete HEPs. Spouse reports that Damascus will often say I'll do them later or give me an hour - we reviewed that he is to complete these as the agreed times. Using mirror for feedback, Jaaziel completing lingual strengthening exercises with and without resistance 10x each with occasional min verbal cues and modeling. He completed Masko 10x (5x then a break, then 5 more) and effortful swallow 15x with occasional min A. He followed swallow precautions with rare min A for water sips 5/5x. They report left labial leakage with meals. Instructed him to use a mirror while eating to give feedback and help control leakage.  Cognition/Dysarthria: 1 episode of communication breakdown today due to dysarthria. Yahsir required questioning cues to Id and correct breakdown by using over articulation and ensuring he says the last sound in his words. Targeted carryover of dysarthria strategies generating sentences with multi-syllabic word with occasional min A to articulate each syllable and last sound. Volume maintained at 72-74dB with mod I. They got 2 1 week pill organizers - Colburn successfully opened the correct day and took meds this morning - he continues to require A to place pills in applesauce due to UE paresis. At conversation level, Kemal is maintaining WNL volume with mod I and requires usual min verbal cues to ID and correct dysarthric errors.   08/20/24: Dysphagia: Pt reports inconsistent practice since last visit. Today he completed Masako and effortful swallows 10x wit hmod I. Isometric lingual strengthening with resistance completed 10x each medial and R/L lateral positions with 3 second hold each. Provided tongue depressor to complete at home, however also educated them that the can use  a spoon or hand as well. Ongoing education to swallow more frequently, especially when laying down to reduce choking on saliva. Instructed them to request referral to ENT for throat discomfort and dysphagia.   Cognition/Dysarthria: Spouse has placed sign to help Matvey remember to turn off the water in the bathroom. He has not forgotten since the sign has been placed. They have not posted a new calendar for January as of yet - will work on this this week. Targeted recall of meds and use of pill organizer. Their pill organizer is large for 4x daily. With verbal cues, Graig identified and opened days and times with rare min A. He will need to be able to turn box over to retrieve pills - Spouse generated strategy of getting 2 pill boxes, 1 for day and 1 for night as he only takes meds twice a day the 4x a day pill box is harder to manage.   08/16/23: Dysphagia: Pt has been practicing HEP regularly at home. Today he completed pharyngeal strengthening exercises for optimizing pt's swallow safety and efficiency. Effortful swallows and Masako were completed with 100% consistency given rare  min A. Pt completed effortful swallows 20x and Masako 5x. SLP challenged pt to complete lingual strengthening (isometric) exercises in the medial and right/left lateral positions. Pt performed medial and lateral lingual exercises with 100% consistency and completed 8 reps/5x for each position.  Plan is to continue pharyngeal/lingual swallow exercises.  Cognition: Pt enters with some carryover of dysarthria strategies (over-articulation and increased volume) requiring occasional min A during structured tasks. SLP introduced memory compensations for optimizing pt's short-term memory recall at home. Pt notes that he forgets to turn the sink and lights off at home. SLP educated pt and pt's wife in using visual cues for improving pt's short-term memory recall and attention to objects in his environment. Pt states that he has forgotten  his walker in the bathroom twice recently. SLP encouraged pt to stop, look at his surroundings before proceeding to leave the bathroom to support attention for remembering his walker to optimize safety awareness. SLP inquired pt about his current use of a calendar and other external memory supports at home. Pt's wife notes that they have a new 2026 calendar that has not been put up yet. SLP educated pt in using external memory supports for increasing pt independence with his short-term memory recall, reducing caregiver burden. SLP provided pt a temporary January, 2026 calendar and had pt identify the current date and an upcoming date for pt's favorite sports teams. Pt was successful with identifying the current date but required min verbal A to identify an upcoming sports game due to pt's selective attention/slight impulsivity. Pt is challenged to use calendar at home to write down pertinent appointments/information for optimizing short-term memory recall. SLP provided pt memory compensations sheet to utilize for incorporating more short-term memory strategies at home. Pt and pt's wife are agreeable to trying new memory compensations. Plan is to continue dysarthria tx and building cognitive-communication support systems. Pt was asked to bring 2 of his current medications to practice medication management in upcoming session.   08/14/23: Dysphagia: Pt has been practicing HEP regularly at home. Today he completed pharyngeal strengthening exercises for optimizing pt's swallow safety and efficiency. Effortful swallows and Masako were completed with 100% consistency given rare min A. Pt completed effortful swallows 20x and Masako 10x. Pt notes that his throat feels more irritated after performing pharyngeal strengthening exercises. Pt's wife states that their doctor wanted to inform SLP about getting an Endoscopy exam performed for checking throat health/swallowing. SLP educated family about importance of  objective instrumental testing for examining throat health and swallow function. Plan is to continue pharyngeal swallow exercises and SLP to f/u with pt's PCP about pursuing an objective examination of swallowing, specifically FEES.   Dysarthria: Pt enters with some carryover of dysarthria strategies (over-articulation and increased volume). Pt engaged in structured description task, where he was required to use dysarthria strategies for responses. Pt utilized dysarthria strategies suring this task in 90% of opportunities given occasional min A. SLP engaged pt in structured conversation, while utilizing dual-task training for challenging pt's selective attention to using dysarthria strategies. Pt used dysarthria strategies with 85% consistency during structured conversation given occasional min A. Pt displayed adequate use of dysarthria strategies during dual-task challenge. Pt is making progress with speech goals. Plan is to continue structured conversation exercises for generalizing dysarthria strategies into conversation-level speech production.   07/30/24: Dysphagia: Pt has been practicing HEP regularly at home. Today he completed lingual resistance exercises and pharyngeal strengthening exercises for optimizing pt's swallow safety and efficiency. Lingual medialization with resistance  completed 10/10x with occasional min modeling and verbal cues. Lingual elevation and lateralization with resistance were each completed 10/10x with occasional min A for modeling. Effortful swallows and Masako each completed 10/10x with occasional min A with breaks given every 3 reps of Masako. Trained in strategy of educating family not to talk to you when you are eating over the holidays, as well as using intent to chew and swallow even with guests or group meals. Spouse verbalizes she will cue him for this as well. Dysarthria: Avraham enters without carryover of strategies for intelligible speech. Reviewed these, targeted  carryover repeating and generating sentences with 4-5 syllable words - Idus required usual mod verbal cues and modeling initially for over articulation and saying each syllable. In structured description task, Lydon carried over slow rate, volume and over articulation 8/10 phrases with occasional min A. Initiated training in speaking and swallowing (and walking) with intent - Cordarryl and his spouse verbalized understanding. Spouse gave an example of when she interrupted Rigby as he was getting up from the table and he had a near fall as he lost attention and intent when standing.    07/24/24: Dysphagia: Pt has been practicing HEP regularly at home. SLP reviewed lingual resistance exercises and pharyngeal strengthening exercises for optimizing pt's swallow safety and efficiency. Lingual medialization with resistance completed 10/10x with occasional min modeling and verbal cues. Lingual elevation and lateralization with resistance were each completed 5/5x with occasional min A for modeling. Effortful swallows and Masako each completed 10/10x with occasional min A. Pt notes that he continues to experiencing increased coughing with saliva at night-time. SLP educated pt about practicing swallow HEP at night before bed to improve secretion management for saliva and reduce coughing. Pt is agreeable andwill try practicing HEP at night. Plan is to continue lingual and pharyngeal strengthening exercises for optimizing swallow safety and efficiency for QOL. Dysarthria: Targeted compensations for dysarthria (over-articulation, increased loudness) in sentence readings. SLP encouraged pt to utilize swallows before speaking to reduce choking on saliva during speech. Pt utilize a swallow prior to speaking in 90% of opportunities given occasional min verbal A. During 5-7 word sentence readings, pt utilized dysarthria compensations in 90% of opportunities given occasional min verbal A. SLP challenged pt to utilize dysarthria  compensations during relevant phrases (personal information). Pt continued to utilize compensations with 90% consistency given occasional min A. Pt is making progress with speech and swallow goals. SLP provided pt homework focused on /s/ sound production in multi-syllabic words and functional phrases; SLP encouraged pt to continue utilizing over-articulation during HEP for maximizing speech intelligibility. Plan is to continue sentence readings and functional phrase readings for improving pt use of dysarthria compensations.   07/16/24: Dysphagia: Initiated HEP for dysphagia and lingual weakness. See Patient Instructions. Lingual elevation and lateralization with resistance exercises completed 15/15x with occasional min modeling and verbal cues. Effortful swallows and Masako completed 10/10x with occasional min A. Targeted more frequent swallowing and swallowing prior to speaking to reduce choking on saliva. With usual verbal cues, Jones demonstrated swallow prior to speaking on structured speech task. He required frequent cues to swallow prior to speaking in conversation. Introduced concept of swallowing with intent, paying attention when eating and drinking  Dysarthria: Targeted compensations for dysarthria in structured speech task generating 3 sentence descriptions - Volume remained WNL with rare min A. Slow rate, over articulation and separating syllables required occasional min modeling and verbal cues. Kanden required cueing to ID and correct slurred/unintelligible utterances 3/3x initially. Targeted  3 syllable words - using separate syllables at slow rate to compensate for dysarthria generating sentences with these words -After initial modeling and instruction, Alphonse was intelligible in sentences with 3 syllable words 12/12x. Generated list of personally relevant phrases to practice including biographical information he uses with doctors' offices, and most frequent meal orders. At the end of session,  Audiel Id'd and self correct imprecise speech in conversation with spouse with mod I.  07/11/24: Evaluation completed. No tx was conducted on this date.    PATIENT EDUCATION: Education details: Evaluation results/goal setting. Person educated: Patient and Spouse Education method: Explanation Education comprehension: verbalized understanding   GOALS: Goals reviewed with patient? Yes  SHORT TERM GOALS: Target date: 08/15/2024  Pt will achieve average >70 dB during 8 minute conversation given occasional min A. Baseline: Goal status: MET  2.  Pt will perform pharyngeal and lingual strengthening exercises with 90% consistency when given rare min A. Baseline:  Goal status: MET  3.  Pt and pt's family will utilize at least 1 external memory strategy for maximizing attention during medication management given occasional min A.  Baseline:  Goal status: MET  4.  Pt will display use of aspiration precautions during PO trials of regular/thin-liquids in 90% of opportunities given rare min A Baseline:  Goal status: MET  5.  Pt will achieve 95-97% speech intelligibility during 8 minute conversation with familiar listeners given occasional min A.  Baseline:  Goal status: MET   LONG TERM GOALS: Target date: 10/03/2024  Pt will achieve average >70 dB during a 12 minute conversation given rare min A.  Baseline:  Goal status: ONGOING  2.  Pt will reduce score on EAT-10 PROM, indicating improved self-perception of swallowing function.  Baseline: 20/36 Goal status: ONGOING  3.  Pt and pt's family will describe and demonstrate at least 2 memory strategies for optimizing pt independence with certain ADL tasks (medication management) given rare min A. Baseline:  Goal status: ONGOING  4.  Pt will improve score on Communicative Effectiveness Survey, indicating speech is optimal for successful communication in different contexts.  Baseline: 11/24 Goal status:  ONGOING   ASSESSMENT:  CLINICAL IMPRESSION: Patient is a 69 y.o. male who was seen today for evaluation of motor speech, dysphagia, and cognition. Pt presents with mild-moderate dysarthria characterized by consistent, imprecise articulation and reduced vocal loudness at the conversational level. Pt also presents with mild-moderate cognitive communication deficits primarily characterized by short-term memory impairment, executive function deficits, and impulsive/inattentive behavior. Pt has hx of oropharyngeal dysphagia and currently still experience difficulty with swallowing. According to EAT-10, pt notes that he experiences difficulty with both solids and liquids during swallowing. Recommend ST targeting conversation-level dysarthria tx and cognitive-communication tx focused on establishing external memory aids for optimizing pt communication effectiveness, safety, and independence. In addition, recommend swallow tx focused on pharyngeal strengthening exercises and aspiration precaution strategies to improve pt perception of swallowing difficulties.   OBJECTIVE IMPAIRMENTS: include attention, memory, executive functioning, dysarthria, and dysphagia. These impairments are limiting patient from managing medications, ADLs/IADLs, effectively communicating at home and in community, and safety when swallowing. Factors affecting potential to achieve goals and functional outcome are N/A. Patient will benefit from skilled SLP services to address above impairments and improve overall function.  REHAB POTENTIAL: Good  PLAN:  SLP FREQUENCY: 2x/week  SLP DURATION: 12 weeks  PLANNED INTERVENTIONS: Aspiration precaution training, Pharyngeal strengthening exercises, Internal/external aids, Functional tasks, SLP instruction and feedback, Patient/family education, [phone] Treatment of speech (30 or 45  min) , and [id number] Treatment of swallowing function   Aneya Daddona Ms, CCC-SLP 08/29/2024, 2:23 PM      "

## 2024-08-29 NOTE — Therapy (Signed)
 " OUTPATIENT PHYSICAL THERAPY NEURO TREATMENT   Patient Name: Richard [name] MRN: [id number] DOB:06/09/1956, 69 y.o., male Today's Date: 08/29/2024  PCP: Ezekiel Charleston, MD REFERRING PROVIDER: Wynne Flatten, MD    END OF SESSION:  PT End of Session - 08/29/24 1016     Visit Number 24    Number of Visits 27   re-cert   Date for Recertification  98/69/73   re-cert; due to potential delay in scheduling   Authorization Type [place]    Authorization Time Period 08/01/24-11/29/24    Authorization - Visit Number 9    Authorization - Number of Visits 15    Progress Note Due on Visit 20    PT Start Time 1015    PT Stop Time 1058    PT Time Calculation (min) 43 min    Equipment Utilized During Treatment Gait belt    Activity Tolerance Patient tolerated treatment well    Behavior During Therapy WFL for tasks assessed/performed                    Past Medical History:  Diagnosis Date   Coronary artery disease    a. cath 04/02/2014 3v dx 80-90% in D1, 80-90% mid LAD, 95% apical LAD, 95% distal inferior LV branch of LCx, total occlusion of prox RCA  b). Cath 04/03/2014 DES to mid LAD, DES x3 to prox D1, DAPT indefinitely. LCx lesion untreated   GERD (gastroesophageal reflux disease)    HIV (human immunodeficiency virus infection) (HCC)    Hypertension    Ischemic cardiomyopathy 08/09/2013   EF 20-25 percent by echo in 04/2014, s/p [employer] [place] model [id number] serial number [id number]   Stroke [facility])    Past Surgical History:  Procedure Laterality Date   CARDIAC CATHETERIZATION  04/02/2014   DR BURNARD   ESOPHAGOGASTRODUODENOSCOPY (EGD) WITH PROPOFOL  N/A 09/08/2018   Procedure: ESOPHAGOGASTRODUODENOSCOPY (EGD) WITH PROPOFOL ;  Surgeon: Rollin Dover, MD;  Location: [facility] ENDOSCOPY;  Service: Endoscopy;  Laterality: N/A;   IMPLANTABLE CARDIOVERTER DEFIBRILLATOR IMPLANT N/A 09/10/2014   Procedure: IMPLANTABLE CARDIOVERTER DEFIBRILLATOR IMPLANT;  Surgeon: Jerel Balding, MD;  [facility] model [id number] serial number [id number]   IR CT HEAD LTD  12/01/2023   IR PERCUTANEOUS ART THROMBECTOMY/INFUSION INTRACRANIAL INC DIAG ANGIO  12/01/2023   IR US  GUIDE VASC ACCESS RIGHT  12/01/2023   LEFT AND RIGHT HEART CATHETERIZATION WITH CORONARY ANGIOGRAM N/A 04/02/2014   Procedure: LEFT AND RIGHT HEART CATHETERIZATION WITH CORONARY ANGIOGRAM;  Surgeon: Debby DELENA Burnard, MD;  Location[facility] C[facility] LAB;  Service: Cardiovascular;  Laterality: N/A;   MEDIAL COLLATERAL LIGAMENT REPAIR, KNEE Right 08/25/2015   Procedure: RIGHT THUMB RADICAL COLLATERAL LIGAMENT REPAIR;  Surgeon: Donnice Robinsons, MD;  Location: [facility] OR;  Service: Orthopedics;  Laterality: Right;   PERCUTANEOUS CORONARY STENT INTERVENTION (PCI-S) N/A 04/03/2014   Procedure: PERCUTANEOUS CORONARY STENT INTERVENTION (PCI-S);  Surgeon: Peter M Jordan, MD;  Location: [facility] CATH LAB;  Service: Cardiovascular;  Laterality: N/A;   RADIOLOGY WITH ANESTHESIA N/A 12/01/2023   Procedure: RADIOLOGY WITH ANESTHESIA;  Surgeon: Radiologist, Medication, MD;  Location: [facility] OR;  Service: Radiology;  Laterality: N/A;  BETHANY BRADFORD DILATION N/A 09/08/2018   Procedure: SAVORY DILATION;  Surgeon: Rollin Dover, MD;  Location: [facility] ENDOSCOPY;  Service: Endoscopy;  Laterality: N/A;   Patient Active Problem List   Diagnosis Date Noted   ICD (implantable cardioverter-defibrillator) discharge 03/23/2024   Hyperlipidemia 01/02/2024   CKD stage 3a, GFR 45-59 ml/min (HCC) 01/02/2024  Depression with anxiety 12/27/2023   Chronic systolic congestive heart failure (HCC) 12/23/2023   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   Acute ischemic right MCA stroke (HCC) 12/01/2023   Schizophrenia, unspecified (HCC) 03/07/2023   Recurrent major depression 03/07/2023   Pseudophakia of left eye 03/07/2023   Acid reflux 03/07/2023   Dyspepsia 03/07/2023   Anxiety state 03/07/2023   Cortical age-related cataract of right eye 03/07/2023   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   CAD (coronary artery disease) 03/07/2023   Abdominal mass 07/12/2021   Peripheral vascular disease 02/04/2021   Overweight 01/21/2020   Dysphagia 12/04/2018   Hiatal hernia 04/19/2018   Gastroesophageal reflux disease 04/03/2018   Ischemic congestive cardiomyopathy (HCC) 10/17/2017   Generalized anxiety disorder 10/17/2017   Prediabetes 10/16/2017   Acquired thrombocytopenia 10/16/2017   Chronic kidney disease, stage 3 (HCC) 10/16/2017   Primary erectile dysfunction 10/13/2017   Chronic combined systolic and diastolic heart failure (HCC) 12/30/2015   Traumatic rupture of collateral ligament of other finger at metacarpophalangeal and interphalangeal joint, subsequent encounter 10/30/2015   Traumatic rupture of collateral ligament of other finger at metacarpophalangeal and interphalangeal joint, initial encounter 08/12/2015   VT (ventricular tachycardia) (HCC) 12/31/2014   ICD (implantable cardioverter-defibrillator) in place 09/10/2014   At risk for sudden cardiac death September 08, 2014   Systolic heart failure (HCC) 08/09/2014   Coronary artery disease involving native coronary artery of native heart without angina pectoris 04/04/2014   Unstable angina (HCC) 04/03/2014   Cardiomyopathy, ischemic 03/22/2014   History of CVA (cerebrovascular accident) 02/20/2014   Human immunodeficiency virus infection (HCC) 02/20/2014   Essential hypertension 02/20/2014   Hyperlipidemia LDL goal <70 02/20/2014   Depression 02/20/2014   Cerebral infarction (HCC) 08/09/2006    ONSET DATE: 05/15/24 referral    REFERRING DIAG: I63.9 (ICD-10-CM) - Cerebral infarction, unspecified   THERAPY DIAG:  Unsteadiness on feet  Other lack of coordination  Other symptoms and signs involving the nervous system  Muscle weakness (generalized)  Abnormal posture  Difficulty in walking, not elsewhere classified  Other abnormalities of gait and mobility  Rationale for Evaluation and  Treatment: Rehabilitation  SUBJECTIVE:                                                                                                                                                                                             SUBJECTIVE STATEMENT:  Pt denies any acute changes since last visit.  Pt accompanied by: significant other, wife Veronica  PERTINENT HISTORY: CAD, GERD, HIV, HTN, ischemic cardiomyopathy, CVA  PAIN:  Are you having pain? No  PRECAUTIONS: Fall and ICD/Pacemaker; L hemi  PATIENT GOALS: to walk and use my hand   OBJECTIVE:  Note: Objective measures were completed at Evaluation unless otherwise noted.  DIAGNOSTIC FINDINGS: 12/01/23 head CT IMPRESSION: 1. Subtle hypoattenuation involving the right caudate and right lentiform nuclei which may reflect acute infarct. 2. No acute intracranial hemorrhage. 3. Remote infarct involving the right frontal operculum, insula, and superior right temporal lobe. 4. Asymmetric density of the right M1 segment. Recommend correlation with CTA. 5. ASPECTS is 8                                                                                                              TREATMENT   Self-Care Vitals:   08/29/24 1018  BP: 103/62  Pulse: (!) 51   BP assessed in RUE in sitting at rest. Vitals WNL for patient.    Gait/NMR  Session focus on HIGT on treadmill with use of BWS harness for safety. Pt able to ascend/descend treadmill with [facility] and assist x 2 for safety, some assist needed for LLE management. Pt able to ambulate x 10 min total on treadmill with several seated rest breaks in between at 0.4 mph. Pt does need assist for LLE advancement while wearing L AFO and for lateral weight shift to the left. Last two repetitions of gait performed with v/c for when to take a step with his RLE as pt tends to exhibit decreased stance time on his LLE and decreased step length with his RLE. He does exhibit improved step length with  RLE with v/c but with onset of fatigue reverts back to a shorter step length with RLE and decreased stance time on LLE. Utilized ACE wrap around L hand for improved grip on treadmill handle.    PATIENT EDUCATION: Education details: continue HEP, PT POC with plan to add visits through what is approved by insurance Person educated: Patient and Spouse Education method: Explanation, Demonstration, Tactile cues, and Verbal cues Education comprehension: verbalized understanding, returned demonstration, verbal cues required, tactile cues required, and needs further education  HOME EXERCISE PROGRAM: Access Code: [email or web address] URL: [email or web address] Date: 06/06/2024 Prepared by: Delon Pop  Exercises - Supine Bridge  - 1 x daily - 7 x weekly - 3 sets - 10 reps - Supine March with Posterior Pelvic Tilt  - 1 x daily - 7 x weekly - 3 sets - 10 reps - Mini Squat with Counter Support  - 1 x daily - 7 x weekly - 3 sets - 8-10 reps - Standing Knee Flexion with Counter Support  - 1 x daily - 7 x weekly - 3 sets - 6-8 reps - Standing March with Counter Support  - 1 x daily - 7 x weekly - 3 sets - 8-10 reps - Staggered Sit-to-Stand  - 1 x daily - 7 x weekly - 3 sets - 10 reps - Side Stepping with Counter Support  - 1 x daily - 7 x weekly - 3 sets - 10 reps - Mini Squat with Counter Support  - 1 x daily - 7 x weekly -  3 sets - 10 reps  GOALS: Goals reviewed with patient? Yes  SHORT TERM GOALS: Target date: 06/22/24  Pt will be independent with initial HEP for improved functional strength and balance  Baseline: to be provided  Goal status: MET  2.  Pt will improve gait speed to >/= .38m/s to demonstrate improved community ambulation  Baseline: .61m/s with HW + CGA; .58m/s with LBQC + CGA Goal status: MET  3.  Pt will improve TUG to </= 70 secs to demonstrated reduced fall risk  Baseline: 98s with HW + MinA; 79s LBQC + CGA Goal status: IN PROGRESS  4.  Pt will improve 5x STS  to </= 22 sec to demo improved functional LE strength and balance   Baseline: 30.81 with CGA + R UE; 24/5s no UE + CGA Goal status: IN PRPOGRESS   LONG TERM GOALS: Target date: 07/13/24 (to match cert date)   Pt will be independent with final HEP for improved functional strength and balance  Baseline: to be provided; provided Goal status: MET  Pt will improve gait speed to >/= .67m/s to demonstrate improved community ambulation  Baseline: .74m/s with HW + CGA; .106m/s LBQC + CGA Goal status: IN PROGRESS  Pt will improve TUG to </= 50 secs to demonstrated reduced fall risk  Baseline: 98s with HW + MinA; 66s LBQC + CGA Goal status: IN PROGRESS  Pt will improve 5x STS to </= 15 sec to demo improved functional LE strength and balance   Baseline: 30.81 with CGA + R UE; 24s R UE + CGA Goal status: IN PROGRESS  NEW SHORT TERM GOALS:       08/10/24  Pt will be compliant with updated HEP for improved functional strength and balance    Baseline: to be updated   Goal status: MET  2. Pt will improve gait speed to >/= .57m/s to demonstrate improved community ambulation Baseline: .74m/s LBQC + CGA, 0.53m/s LBQC and CGA (1/5) Goal status: IN PROGRESS  3. Pt will improve TUG to </= 50 secs to demonstrated reduced fall risk Baseline: 66s LBQC + CGA, 56.91 sec LBQC and CGA (1/5) Goal status: IN PROGRESS  4. Pt will improve 5x STS to </= 20 sec to demo improved functional LE strength and balance  Baseline:  24s R UE + CGA, 21 sec RUE and CGA (1/5) Goal status: IN PROGRESS   NEW LONG TERM GOALS: 09/07/24  Pt will be compliant with updated final HEP for improved functional strength and balance    Baseline: to be updated   Goal status: NEW  2. Pt will improve gait speed to >/= .53m/s to demonstrate improved community ambulation Baseline: .48m/s with HW + CGA, 0.7m/s LBQC and CGA (1/5) Goal status: REVISED/DOWNGRADED  3. Pt will improve TUG to </= 50 secs to demonstrated reduced fall  risk Baseline: 66s LBQC + CGA, 56.91 sec LBQC and CGA (1/5) Goal status: REVISED/DOWNGRADED  4. Pt will improve 5x STS to </= 18 sec to demo improved functional LE strength and balance  Baseline: 24s R UE + CGA, 21 sec RUE and CGA (1/5) Goal status: REVISED/DOWNGRADED   ASSESSMENT:  CLINICAL IMPRESSION: Emphasis of skilled PT session on working on HIGT on treadmill with BWS harness. Pt does well with HIGT this visit and is able to ambulate for a total of 10 min with several seated rest breaks every couple of minutes. He is able to maintain a consistent gait speed of 0.4 mph while walking on the treadmill. See  above for gait deviations. He continues to benefit from skilled PT services to work on LLE NMR as well as to work towards increased safety and independence with his functional mobility. Continue POC.    OBJECTIVE IMPAIRMENTS: Abnormal gait, decreased balance, decreased coordination, decreased endurance, decreased knowledge of condition, decreased knowledge of use of DME, decreased ROM, decreased strength, impaired tone, impaired UE functional use, and postural dysfunction.   ACTIVITY LIMITATIONS: carrying, lifting, bending, standing, squatting, stairs, transfers, bed mobility, locomotion level, and caring for others  PARTICIPATION LIMITATIONS: meal prep, cleaning, interpersonal relationship, driving, shopping, community activity, occupation, and yard work  PERSONAL FACTORS: Age, Fitness, Past/current experiences, Time since onset of injury/illness/exacerbation, Transportation, and 3+ comorbidities: see above are also affecting patient's functional outcome.   REHAB POTENTIAL: Fair time since onset  CLINICAL DECISION MAKING: Stable/uncomplicated  EVALUATION COMPLEXITY: Low  PLAN:  PT FREQUENCY: 2x/week  PT DURATION: other: 7 weeks  PLANNED INTERVENTIONS: [id number]- PT Re-evaluation, 97750- Physical Performance Testing, 97110-Therapeutic exercises, 97530- Therapeutic activity,  [id number]- Neuromuscular re-education, 97535- Self Care, [id number]- Manual therapy, [id number]- Gait training, [phone]- Orthotic Initial, [phone]- Orthotic/Prosthetic subsequent, [phone]- Aquatic Therapy, [phone] (1-2 muscles), [id number] (3+ muscles)- Dry Needling, Patient/Family education, Balance training, Stair training, Taping, Vestibular training, Visual/preceptual remediation/compensation, DME instructions, and Wheelchair mobility training  PLAN FOR NEXT SESSION: HIGT! Continue working with [facility], continue use of L LE, weight shifting in bars with toe taps, improve L hip flexion strength. Facilitate left weight shifting during ambulation/ literally anything to get more weight on the left leg (modified SLS, R LE step ups so he has to spend more time on L, etc); staggered sit to stands, L hip abd strengthening (resisted dot taps), did they get [facility]?, tall kneel at bench on mat table, resisted LLE forward step, RLE hurdles with manual assist for LLE in stance  Add more visits beyond January? (3 for SLP, 4 for PT, OT plans to d/c)    Waddell Southgate, PT Waddell Southgate, PT, DPT, CSRS   08/29/2024, 11:00 AM        "

## 2024-09-03 ENCOUNTER — Ambulatory Visit: Admitting: Occupational Therapy

## 2024-09-03 ENCOUNTER — Ambulatory Visit

## 2024-09-03 ENCOUNTER — Ambulatory Visit: Admitting: Speech Pathology

## 2024-09-05 ENCOUNTER — Ambulatory Visit: Admitting: Occupational Therapy

## 2024-09-05 ENCOUNTER — Ambulatory Visit: Admitting: Physical Therapy

## 2024-09-05 ENCOUNTER — Ambulatory Visit

## 2024-09-06 ENCOUNTER — Encounter: Admitting: Physical Medicine & Rehabilitation

## 2024-09-11 ENCOUNTER — Ambulatory Visit: Payer: Medicare (Managed Care) | Admitting: Adult Health

## 2024-09-11 ENCOUNTER — Encounter: Payer: Self-pay | Admitting: Adult Health

## 2024-09-11 VITALS — BP 102/67 | HR 55 | Ht 70.0 in | Wt 165.0 lb

## 2024-09-11 DIAGNOSIS — G8114 Spastic hemiplegia affecting left nondominant side: Secondary | ICD-10-CM | POA: Diagnosis not present

## 2024-09-11 DIAGNOSIS — I63411 Cerebral infarction due to embolism of right middle cerebral artery: Secondary | ICD-10-CM

## 2024-09-11 NOTE — Patient Instructions (Signed)
 Continue working with therapies for hopeful ongoing recovery - keep up the good work!   Follow up with Dr. Carilyn for initial botox injection at the end of this month   Continue aspirin  81 mg daily and Eliquis   and atorvastatin   for secondary stroke prevention  Continue to follow up with PCP regarding blood pressure and cholesterol management  Maintain strict control of hypertension with blood pressure goal below 130/90 and cholesterol with LDL cholesterol (bad cholesterol) goal below 70 mg/dL.   Signs of a Stroke? Follow the BEFAST method:  Balance Watch for a sudden loss of balance, trouble with coordination or vertigo Eyes Is there a sudden loss of vision in one or both eyes? Or double vision?  Face: Ask the person to smile. Does one side of the face droop or is it numb?  Arms: Ask the person to raise both arms. Does one arm drift downward? Is there weakness or numbness of a leg? Speech: Ask the person to repeat a simple phrase. Does the speech sound slurred/strange? Is the person confused ? Time: If you observe any of these signs, call 911.       Thank you for coming to see us  at [facility]. I hope we have been able to provide you high quality care today.  You may receive a patient satisfaction survey over the next few weeks. We would appreciate your feedback and comments so that we may continue to improve ourselves and the health of our patients.

## 2024-09-12 ENCOUNTER — Encounter: Payer: Self-pay | Admitting: Speech Pathology

## 2024-09-12 ENCOUNTER — Ambulatory Visit: Admitting: Speech Pathology

## 2024-09-12 DIAGNOSIS — R1312 Dysphagia, oropharyngeal phase: Secondary | ICD-10-CM

## 2024-09-12 DIAGNOSIS — R471 Dysarthria and anarthria: Secondary | ICD-10-CM

## 2024-09-12 DIAGNOSIS — R41841 Cognitive communication deficit: Secondary | ICD-10-CM

## 2024-09-12 NOTE — Patient Instructions (Signed)
" ° °  Swallow 2x  Sip  Use hard swallow "

## 2024-09-12 NOTE — Therapy (Signed)
 " OUTPATIENT SPEECH LANGUAGE PATHOLOGY TREATMENT   Patient Name: Richard [name] MRN: [id number] DOB:1955-10-17, 69 y.o., male Today's Date: 09/12/2024  PCP: [name], MD REFERRING PROVIDER: [name], MD  END OF SESSION:  End of Session - 09/12/24 1108     Visit Number 11    Number of Visits 25    Date for Recertification  10/03/24    SLP Start Time 1104    SLP Stop Time  1145    SLP Time Calculation (min) 41 min    Activity Tolerance Patient tolerated treatment well              Past Medical History:  Diagnosis Date   Coronary artery disease    a. cath 04/02/2014 3v dx 80-90% in D1, 80-90% mid LAD, 95% apical LAD, 95% distal inferior LV branch of LCx, total occlusion of prox RCA  b). Cath 04/03/2014 DES to mid LAD, DES x3 to prox D1, DAPT indefinitely. LCx lesion untreated   GERD (gastroesophageal reflux disease)    HIV (human immunodeficiency virus infection) (HCC)    Hypertension    Ischemic cardiomyopathy 08/09/2013   EF 20-25 percent by echo in 04/2014, s/p [employer] Nitro model [id number] serial number [id number]   Stroke [facility])    Past Surgical History:  Procedure Laterality Date   CARDIAC CATHETERIZATION  04/02/2014   DR BURNARD   ESOPHAGOGASTRODUODENOSCOPY (EGD) WITH PROPOFOL  N/A 09/08/2018   Procedure: ESOPHAGOGASTRODUODENOSCOPY (EGD) WITH PROPOFOL ;  Surgeon: Rollin Dover, MD;  Location: [facility] ENDOSCOPY;  Service: Endoscopy;  Laterality: N/A;   IMPLANTABLE CARDIOVERTER DEFIBRILLATOR IMPLANT N/A 09/10/2014   Procedure: IMPLANTABLE CARDIOVERTER DEFIBRILLATOR IMPLANT;  Surgeon: Jerel Balding, MD;  [facility] [place] model [id number] serial number [id number]   IR CT HEAD LTD  12/01/2023   IR PERCUTANEOUS ART THROMBECTOMY/INFUSION INTRACRANIAL INC DIAG ANGIO  12/01/2023   IR US  GUIDE VASC ACCESS RIGHT  12/01/2023   LEFT AND RIGHT HEART CATHETERIZATION WITH CORONARY ANGIOGRAM N/A 04/02/2014   Procedure: LEFT AND RIGHT HEART CATHETERIZATION WITH CORONARY  ANGIOGRAM;  Surgeon: Debby DELENA Burnard, MD;  Location: [facility] CATH LAB;  Service: Cardiovascular;  Laterality: N/A;   MEDIAL COLLATERAL LIGAMENT REPAIR, KNEE Right 08/25/2015   Procedure: RIGHT THUMB RADICAL COLLATERAL LIGAMENT REPAIR;  Surgeon: Donnice Robinsons, MD;  Location: [facility] OR;  Service: Orthopedics;  Laterality: Right;   PERCUTANEOUS CORONARY STENT INTERVENTION (PCI-S) N/A 04/03/2014   Procedure: PERCUTANEOUS CORONARY STENT INTERVENTION (PCI-S);  Surgeon: Peter M Jordan, MD;  Location: [facility] CATH LAB;  Service: Cardiovascular;  Laterality: N/A;   RADIOLOGY WITH ANESTHESIA N/A 12/01/2023   Procedure: RADIOLOGY WITH ANESTHESIA;  Surgeon: Radiologist, Medication, MD;  Location: [facility] OR;  Service: Radiology;  Laterality: N/A;  BETHANY BRADFORD DILATION N/A 09/08/2018   Procedure: SAVORY DILATION;  Surgeon: Rollin Dover, MD;  Location: [facility] ENDOSCOPY;  Service: Endoscopy;  Laterality: N/A;   Patient Active Problem List   Diagnosis Date Noted   ICD (implantable cardioverter-defibrillator) discharge 03/23/2024   Hyperlipidemia 01/02/2024   CKD stage 3a, GFR 45-59 ml/min (HCC) 01/02/2024   Depression with anxiety 12/27/2023   Chronic systolic congestive heart failure (HCC) 12/23/2023   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   Acute ischemic right MCA stroke (HCC) 12/01/2023   Schizophrenia, unspecified (HCC) 03/07/2023   Recurrent major depression 03/07/2023   Pseudophakia of left eye 03/07/2023   Acid reflux 03/07/2023   Dyspepsia 03/07/2023   Anxiety state 03/07/2023   Cortical age-related cataract of right eye 03/07/2023  Psychotic disorder (HCC) 03/07/2023   CAD (coronary artery disease) 03/07/2023   Abdominal mass 07/12/2021   Peripheral vascular disease 02/04/2021   Overweight 01/21/2020   Dysphagia 12/04/2018   Hiatal hernia 04/19/2018   Gastroesophageal reflux disease 04/03/2018   Ischemic congestive cardiomyopathy (HCC) 10/17/2017   Generalized anxiety disorder 10/17/2017    Prediabetes 10/16/2017   Acquired thrombocytopenia 10/16/2017   Chronic kidney disease, stage 3 (HCC) 10/16/2017   Primary erectile dysfunction 10/13/2017   Chronic combined systolic and diastolic heart failure (HCC) 12/30/2015   Traumatic rupture of collateral ligament of other finger at metacarpophalangeal and interphalangeal joint, subsequent encounter 10/30/2015   Traumatic rupture of collateral ligament of other finger at metacarpophalangeal and interphalangeal joint, initial encounter 08/12/2015   VT (ventricular tachycardia) (HCC) 12/31/2014   ICD (implantable cardioverter-defibrillator) in place 09/10/2014   At risk for sudden cardiac death 2014/09/03   Systolic heart failure (HCC) 08/09/2014   Coronary artery disease involving native coronary artery of native heart without angina pectoris 04/04/2014   Unstable angina (HCC) 04/03/2014   Cardiomyopathy, ischemic 03/22/2014   History of CVA (cerebrovascular accident) 02/20/2014   Human immunodeficiency virus infection (HCC) 02/20/2014   Essential hypertension 02/20/2014   Hyperlipidemia LDL goal <70 02/20/2014   Depression 02/20/2014   Cerebral infarction (HCC) 08/09/2006    ONSET DATE: 11/2023  REFERRING DIAG: I63.9 (ICD-10-CM) - Cerebral infarction, unspecified  THERAPY DIAG:  Dysarthria and anarthria  Cognitive communication deficit  Dysphagia, oropharyngeal phase  Rationale for Evaluation and Treatment: Rehabilitation  SUBJECTIVE:   SUBJECT.I got choked this morning Pt accompanied by: self and significant other  PERTINENT HISTORY: Pt is a 69 y.o. male who has a recent hx of CVA back in April, 2025. Pt noted residual deficits include dysarthria, cognitive-communication deficits and dysphagia. Pt's wife was present and provided additional hx. Pt's wife notices that pt's speech is low in volume during conversations. Pt recently received HH ST, primarily targeting dysarthria at sentence and conversation levels. Pt  also received ST in CIR 11/2023-12/2023, targeting swallowing, dysarthria, and cognition. Pt currently consumes regular/thin-liquid diet. Pt endorses coughing and choking on secretions on occasion. Pt's wife handles household responsibilities (finances, medication management, etc.). Pt's wife states that pt tends to lose focus during longer tasks. Pt enjoys watching sports at home. PMHx: CAD, GERD, HIV, HTN, ischemic cardiomyopathy, CVA.  PAIN:  Are you having pain? No  FALLS: Has patient fallen in last 6 months?  See PT evaluation for details  LIVING ENVIRONMENT: Lives with: lives with their family and lives with their spouse Lives in: House/apartment  PLOF:  Level of assistance: Comment: Independent with driving and medication management; wife still did finances  Employment: On disability  PATIENT GOALS: To improve thinking skills, swallowing, and speech.  OBJECTIVE:  Note: Objective measures were completed at Evaluation unless otherwise noted.  DIAGNOSTIC FINDINGS: MBSS from 12/08/23 Patient presents with mild oropharyngeal dysphagia. Oral phase is characterized by reduced lingual movement and labial seal resulting in anterior and posterior spillage. Patient independently utilized multiple swallows and digital sweep when necessary to clear oral residuals. Pharyngeal phase is remarkable for reduced epiglottic inversion and pharyngeal stripping wave resulting in mild pharyngeal residuals, mainly present in the vallecula and pyriform sinuses. Penetration present x2 with thin liquids (x1 via straw, x1 during liquid wash), though likely due to premature spillage and mistiming of epiglottic inversion and laryngeal vestibular closure. Pill consumed in puree, requiring additional bites of PO due to suspected esophageal retention. Recommend continuation of D2/thin liquid diet  with small, single sips of liquids. Patient should continue to utilize digital sweep to clear buccal pocketing of solids.  Recommend medications administered whole in puree with additional bites to promote esophogeal clearance.     COGNITION: Overall cognitive status: Impaired Areas of impairment:  Attention: Impaired: Selective Memory: Impaired: Immediate Working Short term Psychologist, Educational function: Impaired: Impulse control, Organization, Planning, and Error awareness Behavior: Impulsive Functional deficits: Medication management, time management.     MOTOR SPEECH: Overall motor speech: impaired Level of impairment: Conversation Respiration: thoracic breathing and diaphragmatic/abdominal breathing Phonation: normal and low vocal intensity Resonance: WFL Articulation: Impaired: conversation Intelligibility: Intelligibility reduced Motor planning: Appears intact Motor speech errors: aware and consistent Interfering components: N/A Effective technique: increased vocal intensity and pacing  ORAL MOTOR EXAMINATION: Overall status: Impaired:   Lingual: Left (Symmetry and Strength) Comments: N/A   STANDARDIZED ASSESSMENTS: SLUMS: 20/30  PATIENT REPORTED OUTCOME MEASURES (PROM): EAT-10: 20/36 and Communication Effectiveness Survey: 11/24                                                                                                                            TREATMENT DATE:   09/12/24: Dysphagia: Richard [name] completes HEP for dysphagia with rare min A to mod I. They continue to report choking daily - Observed POI (cereal bar and water) Richard [name] attempted to take water after chewing but before swallowing the bar.  Implemented swallow precautions of 2 swallows for each bite, then a sip. After initial instructions and verbal cues, Richard [name] followed swallow precautions with rare min A - they are to carry over this protocol and assess for reduction of coughing with PO. Cognition/Dysarthria: Richard [name] is carrying over HEP log to recall and complete PT/OT HEP 1x a day. He is using pill organizer to recall meds and day with  mod I. He is recalling appointments with rare min A with external aids and rare min A from spouse. He carried over slow rate and over articulation with multisyllabic words/sentences 10/10 with mod I. He carries over intelligible conversation with mod I in ST, requires cues from spouse to use strategies at home.  08/29/24: Dysphagia: Richard [name] and spouse report ongoing more consistent completion of HEP for dysphagia. Today, Richard [name] completed all lingual strengthening exercises with rare min A to supervision cues. He completed Masako and effortful swallows with rare min A. One episode of talking while drinking water resulted in immediate coughing. Used this to demonstrate how distractions affect safety of swallow - pt verbalized understanding. Cognition/dysarthria: Pt continues to report remembering am meds with pill organizer, however he is not referring to his calendar as much. Today he recalled 3 upcoming appointments and today's date, indicating improvement to appointment management. Richard [name] entered ST not carrying over dysarthria strategies with intelligibility at 75% and verbal cues to correct unintelligible utterances. Targeted carryover of compensatory strategies (volume, over articulation, slow rate) in structured sentence generation task with rare min A and 1 request for repetition out of 12 sentences. In structured  conversation (open ended questions cards) James required rare min A to carryover dysarthria strategies.   08/27/24: Dysphagia: Richard [name] reports more consistent completion of swallow exercises - today he required occasional min verbal cue and modeling to complete Masako - verbal instruction required to not use water sip with Masako but to take a sip, hard swallow, then complete Masako 2-3x with out water sip. Masako and effortful swallow completed 15x each, alternating 2-3 Masako with sips using hard swallow. Larrell complete lingual strengthening exercises using tongue depressor as resistance, today  he completed holding the tongue depressor independently, 10 reps of each exercise with rare min A. Spouse reports improve independence at home with HEP as well Dysarthria, cognition: Richard [name] returns with external aid of HEP log to help recall and track completion of OT and PT HEP - He has been 100% consistent completing HEPs in the afternoon and 50% consistent completing them in am - much improved with use of HEP log. He is to to continue this this week and focus on more consistent am exercises. Targeted carryover of compensations for dysarthria in structured task with moderate cognitive load generating similarities and differences of personally relevant related words at sentence level - with occasional min verbal cues, he carried over volume and over articulation to be intelligible 4/4 word pairs. In conversation he averaged 72dB with rare min A - he reports he does not carry this over at home consistently as he gets relaxed    Richard [name] returns HEP  log with pm HEP  (PT/OT) completed consistently, am HEP 50% of days. He is to continue to use HEP log  08/22/24: Dysphagia: Targeted consistent completion of HEP for OT/PT/ST by collaborating with pt and spouse to generate routine times to completed HEPs BID. They agreed that after breakfast and after lunch he will complete HEPs. Spouse reports that Richard [name] will often say I'll do them later or give me an hour - we reviewed that he is to complete these as the agreed times. Using mirror for feedback, Richard [name] completing lingual strengthening exercises with and without resistance 10x each with occasional min verbal cues and modeling. He completed Masko 10x (5x then a break, then 5 more) and effortful swallow 15x with occasional min A. He followed swallow precautions with rare min A for water sips 5/5x. They report left labial leakage with meals. Instructed him to use a mirror while eating to give feedback and help control leakage.  Cognition/Dysarthria: 1 episode  of communication breakdown today due to dysarthria. Richard [name] required questioning cues to Id and correct breakdown by using over articulation and ensuring he says the last sound in his words. Targeted carryover of dysarthria strategies generating sentences with multi-syllabic word with occasional min A to articulate each syllable and last sound. Volume maintained at 72-74dB with mod I. They got 2 1 week pill organizers - Derl successfully opened the correct day and took meds this morning - he continues to require A to place pills in applesauce due to UE paresis. At conversation level, Richard [name] is maintaining WNL volume with mod I and requires usual min verbal cues to ID and correct dysarthric errors.   08/20/24: Dysphagia: Pt reports inconsistent practice since last visit. Today he completed Masako and effortful swallows 10x wit hmod I. Isometric lingual strengthening with resistance completed 10x each medial and R/L lateral positions with 3 second hold each. Provided tongue depressor to complete at home, however also educated them that the can use a spoon or hand as well. Ongoing education  to swallow more frequently, especially when laying down to reduce choking on saliva. Instructed them to request referral to ENT for throat discomfort and dysphagia.   Cognition/Dysarthria: Spouse has placed sign to help Octavious remember to turn off the water in the bathroom. He has not forgotten since the sign has been placed. They have not posted a new calendar for January as of yet - will work on this this week. Targeted recall of meds and use of pill organizer. Their pill organizer is large for 4x daily. With verbal cues, Atlee identified and opened days and times with rare min A. He will need to be able to turn box over to retrieve pills - Spouse generated strategy of getting 2 pill boxes, 1 for day and 1 for night as he only takes meds twice a day the 4x a day pill box is harder to manage.   08/16/23: Dysphagia: Pt has  been practicing HEP regularly at home. Today he completed pharyngeal strengthening exercises for optimizing pt's swallow safety and efficiency. Effortful swallows and Masako were completed with 100% consistency given rare min A. Pt completed effortful swallows 20x and Masako 5x. SLP challenged pt to complete lingual strengthening (isometric) exercises in the medial and right/left lateral positions. Pt performed medial and lateral lingual exercises with 100% consistency and completed 8 reps/5x for each position.  Plan is to continue pharyngeal/lingual swallow exercises.  Cognition: Pt enters with some carryover of dysarthria strategies (over-articulation and increased volume) requiring occasional min A during structured tasks. SLP introduced memory compensations for optimizing pt's short-term memory recall at home. Pt notes that he forgets to turn the sink and lights off at home. SLP educated pt and pt's wife in using visual cues for improving pt's short-term memory recall and attention to objects in his environment. Pt states that he has forgotten his walker in the bathroom twice recently. SLP encouraged pt to stop, look at his surroundings before proceeding to leave the bathroom to support attention for remembering his walker to optimize safety awareness. SLP inquired pt about his current use of a calendar and other external memory supports at home. Pt's wife notes that they have a new 2026 calendar that has not been put up yet. SLP educated pt in using external memory supports for increasing pt independence with his short-term memory recall, reducing caregiver burden. SLP provided pt a temporary January, 2026 calendar and had pt identify the current date and an upcoming date for pt's favorite sports teams. Pt was successful with identifying the current date but required min verbal A to identify an upcoming sports game due to pt's selective attention/slight impulsivity. Pt is challenged to use calendar at  home to write down pertinent appointments/information for optimizing short-term memory recall. SLP provided pt memory compensations sheet to utilize for incorporating more short-term memory strategies at home. Pt and pt's wife are agreeable to trying new memory compensations. Plan is to continue dysarthria tx and building cognitive-communication support systems. Pt was asked to bring 2 of his current medications to practice medication management in upcoming session.   08/14/23: Dysphagia: Pt has been practicing HEP regularly at home. Today he completed pharyngeal strengthening exercises for optimizing pt's swallow safety and efficiency. Effortful swallows and Masako were completed with 100% consistency given rare min A. Pt completed effortful swallows 20x and Masako 10x. Pt notes that his throat feels more irritated after performing pharyngeal strengthening exercises. Pt's wife states that their doctor wanted to inform SLP about getting an Endoscopy exam performed  for checking throat health/swallowing. SLP educated family about importance of objective instrumental testing for examining throat health and swallow function. Plan is to continue pharyngeal swallow exercises and SLP to f/u with pt's PCP about pursuing an objective examination of swallowing, specifically FEES.   Dysarthria: Pt enters with some carryover of dysarthria strategies (over-articulation and increased volume). Pt engaged in structured description task, where he was required to use dysarthria strategies for responses. Pt utilized dysarthria strategies suring this task in 90% of opportunities given occasional min A. SLP engaged pt in structured conversation, while utilizing dual-task training for challenging pt's selective attention to using dysarthria strategies. Pt used dysarthria strategies with 85% consistency during structured conversation given occasional min A. Pt displayed adequate use of dysarthria strategies during dual-task  challenge. Pt is making progress with speech goals. Plan is to continue structured conversation exercises for generalizing dysarthria strategies into conversation-level speech production.   07/30/24: Dysphagia: Pt has been practicing HEP regularly at home. Today he completed lingual resistance exercises and pharyngeal strengthening exercises for optimizing pt's swallow safety and efficiency. Lingual medialization with resistance completed 10/10x with occasional min modeling and verbal cues. Lingual elevation and lateralization with resistance were each completed 10/10x with occasional min A for modeling. Effortful swallows and Masako each completed 10/10x with occasional min A with breaks given every 3 reps of Masako. Trained in strategy of educating family not to talk to you when you are eating over the holidays, as well as using intent to chew and swallow even with guests or group meals. Spouse verbalizes she will cue him for this as well. Dysarthria: Richard [name] enters without carryover of strategies for intelligible speech. Reviewed these, targeted carryover repeating and generating sentences with 4-5 syllable words - Richard [name] required usual mod verbal cues and modeling initially for over articulation and saying each syllable. In structured description task, Richard [name] carried over slow rate, volume and over articulation 8/10 phrases with occasional min A. Initiated training in speaking and swallowing (and walking) with intent - Richard [name] and his spouse verbalized understanding. Spouse gave an example of when she interrupted Richard [name] as he was getting up from the table and he had a near fall as he lost attention and intent when standing.    07/24/24: Dysphagia: Pt has been practicing HEP regularly at home. SLP reviewed lingual resistance exercises and pharyngeal strengthening exercises for optimizing pt's swallow safety and efficiency. Lingual medialization with resistance completed 10/10x with occasional min modeling and  verbal cues. Lingual elevation and lateralization with resistance were each completed 5/5x with occasional min A for modeling. Effortful swallows and Masako each completed 10/10x with occasional min A. Pt notes that he continues to experiencing increased coughing with saliva at night-time. SLP educated pt about practicing swallow HEP at night before bed to improve secretion management for saliva and reduce coughing. Pt is agreeable andwill try practicing HEP at night. Plan is to continue lingual and pharyngeal strengthening exercises for optimizing swallow safety and efficiency for QOL. Dysarthria: Targeted compensations for dysarthria (over-articulation, increased loudness) in sentence readings. SLP encouraged pt to utilize swallows before speaking to reduce choking on saliva during speech. Pt utilize a swallow prior to speaking in 90% of opportunities given occasional min verbal A. During 5-7 word sentence readings, pt utilized dysarthria compensations in 90% of opportunities given occasional min verbal A. SLP challenged pt to utilize dysarthria compensations during relevant phrases (personal information). Pt continued to utilize compensations with 90% consistency given occasional min A. Pt is making progress with speech  and swallow goals. SLP provided pt homework focused on /s/ sound production in multi-syllabic words and functional phrases; SLP encouraged pt to continue utilizing over-articulation during HEP for maximizing speech intelligibility. Plan is to continue sentence readings and functional phrase readings for improving pt use of dysarthria compensations.   07/16/24: Dysphagia: Initiated HEP for dysphagia and lingual weakness. See Patient Instructions. Lingual elevation and lateralization with resistance exercises completed 15/15x with occasional min modeling and verbal cues. Effortful swallows and Masako completed 10/10x with occasional min A. Targeted more frequent swallowing and swallowing prior to  speaking to reduce choking on saliva. With usual verbal cues, Richard [name] demonstrated swallow prior to speaking on structured speech task. He required frequent cues to swallow prior to speaking in conversation. Introduced concept of swallowing with intent, paying attention when eating and drinking  Dysarthria: Targeted compensations for dysarthria in structured speech task generating 3 sentence descriptions - Volume remained WNL with rare min A. Slow rate, over articulation and separating syllables required occasional min modeling and verbal cues. Richard [name] required cueing to ID and correct slurred/unintelligible utterances 3/3x initially. Targeted 3 syllable words - using separate syllables at slow rate to compensate for dysarthria generating sentences with these words -After initial modeling and instruction, Bellamy was intelligible in sentences with 3 syllable words 12/12x. Generated list of personally relevant phrases to practice including biographical information he uses with doctors' offices, and most frequent meal orders. At the end of session, Richard [name] Id'd and self correct imprecise speech in conversation with spouse with mod I.  07/11/24: Evaluation completed. No tx was conducted on this date.    PATIENT EDUCATION: Education details: Evaluation results/goal setting. Person educated: Patient and Spouse Education method: Explanation Education comprehension: verbalized understanding   GOALS: Goals reviewed with patient? Yes  SHORT TERM GOALS: Target date: 08/15/2024  Pt will achieve average >70 dB during 8 minute conversation given occasional min A. Baseline: Goal status: MET  2.  Pt will perform pharyngeal and lingual strengthening exercises with 90% consistency when given rare min A. Baseline:  Goal status: MET  3.  Pt and pt's family will utilize at least 1 external memory strategy for maximizing attention during medication management given occasional min A.  Baseline:  Goal status:  MET  4.  Pt will display use of aspiration precautions during PO trials of regular/thin-liquids in 90% of opportunities given rare min A Baseline:  Goal status: MET  5.  Pt will achieve 95-97% speech intelligibility during 8 minute conversation with familiar listeners given occasional min A.  Baseline:  Goal status: MET   LONG TERM GOALS: Target date: 10/03/2024  Pt will achieve average >70 dB during a 12 minute conversation given rare min A.  Baseline:  Goal status: ONGOING  2.  Pt will reduce score on EAT-10 PROM, indicating improved self-perception of swallowing function.  Baseline: 20/36 Goal status: ONGOING  3.  Pt and pt's family will describe and demonstrate at least 2 memory strategies for optimizing pt independence with certain ADL tasks (medication management) given rare min A. Baseline:  Goal status: ONGOING  4.  Pt will improve score on Communicative Effectiveness Survey, indicating speech is optimal for successful communication in different contexts.  Baseline: 11/24 Goal status: ONGOING   ASSESSMENT:  CLINICAL IMPRESSION: Patient is a 69 y.o. male who was seen today for evaluation of motor speech, dysphagia, and cognition. Pt presents with mild-moderate dysarthria characterized by consistent, imprecise articulation and reduced vocal loudness at the conversational level. Pt also presents with mild-moderate  cognitive communication deficits primarily characterized by short-term memory impairment, executive function deficits, and impulsive/inattentive behavior. Pt has hx of oropharyngeal dysphagia and currently still experience difficulty with swallowing. According to EAT-10, pt notes that he experiences difficulty with both solids and liquids during swallowing. Recommend ST targeting conversation-level dysarthria tx and cognitive-communication tx focused on establishing external memory aids for optimizing pt communication effectiveness, safety, and independence. In  addition, recommend swallow tx focused on pharyngeal strengthening exercises and aspiration precaution strategies to improve pt perception of swallowing difficulties.   OBJECTIVE IMPAIRMENTS: include attention, memory, executive functioning, dysarthria, and dysphagia. These impairments are limiting patient from managing medications, ADLs/IADLs, effectively communicating at home and in community, and safety when swallowing. Factors affecting potential to achieve goals and functional outcome are N/A. Patient will benefit from skilled SLP services to address above impairments and improve overall function.  REHAB POTENTIAL: Good  PLAN:  SLP FREQUENCY: 2x/week  SLP DURATION: 12 weeks  PLANNED INTERVENTIONS: Aspiration precaution training, Pharyngeal strengthening exercises, Internal/external aids, Functional tasks, SLP instruction and feedback, Patient/family education, [phone] Treatment of speech (30 or 45 min) , and [id number] Treatment of swallowing function   Koray Soter Ms, CCC-SLP 09/12/2024, 11:44 AM      "

## 2024-09-14 ENCOUNTER — Ambulatory Visit: Admitting: Physical Therapy

## 2024-09-14 VITALS — BP 107/63 | HR 53

## 2024-09-14 DIAGNOSIS — R293 Abnormal posture: Secondary | ICD-10-CM

## 2024-09-14 DIAGNOSIS — M6281 Muscle weakness (generalized): Secondary | ICD-10-CM

## 2024-09-14 DIAGNOSIS — R262 Difficulty in walking, not elsewhere classified: Secondary | ICD-10-CM

## 2024-09-14 DIAGNOSIS — R2689 Other abnormalities of gait and mobility: Secondary | ICD-10-CM

## 2024-09-14 DIAGNOSIS — R2681 Unsteadiness on feet: Secondary | ICD-10-CM

## 2024-09-14 DIAGNOSIS — R29818 Other symptoms and signs involving the nervous system: Secondary | ICD-10-CM

## 2024-09-14 NOTE — Therapy (Incomplete)
 " OUTPATIENT PHYSICAL THERAPY NEURO TREATMENT - RECERT   Patient Name: Richard [name] MRN: [id number] DOB:[birth year], 69 y.o., male Today's Date: 09/14/2024  PCP: Ezekiel Charleston, MD REFERRING PROVIDER: Wynne Flatten, MD    END OF SESSION:  PT End of Session - 09/14/24 1144     Visit Number 25    Number of Visits 27   re-cert   Date for Recertification  98/69/73   re-cert; due to potential delay in scheduling   Authorization Type [facility]    Authorization Time Period 08/01/24-11/29/24    Authorization - Number of Visits 15    Progress Note Due on Visit 20    PT Start Time 1144    PT Stop Time 1230    PT Time Calculation (min) 46 min    Equipment Utilized During Treatment Gait belt    Activity Tolerance Patient tolerated treatment well    Behavior During Therapy WFL for tasks assessed/performed                     Past Medical History:  Diagnosis Date   Coronary artery disease    a. cath 04/02/2014 3v dx 80-90% in D1, 80-90% mid LAD, 95% apical LAD, 95% distal inferior LV branch of LCx, total occlusion of prox RCA  b). Cath 04/03/2014 DES to mid LAD, DES x3 to prox D1, DAPT indefinitely. LCx lesion untreated   GERD (gastroesophageal reflux disease)    HIV (human immunodeficiency virus infection) (HCC)    Hypertension    Ischemic cardiomyopathy 08/09/2013   EF 20-25 percent by echo in 04/2014, s/p [employer] [place] model [id number] serial number [id number]   Stroke [facility])    Past Surgical History:  Procedure Laterality Date   CARDIAC CATHETERIZATION  04/02/2014   DR BURNARD   ESOPHAGOGASTRODUODENOSCOPY (EGD) WITH PROPOFOL  N/A 09/08/2018   Procedure: ESOPHAGOGASTRODUODENOSCOPY (EGD) WITH PROPOFOL ;  Surgeon: Rollin Dover, MD;  Location: [facility] ENDOSCOPY;  Service: Endoscopy;  Laterality: N/A;   IMPLANTABLE CARDIOVERTER DEFIBRILLATOR IMPLANT N/A 09/10/2014   Procedure: IMPLANTABLE CARDIOVERTER DEFIBRILLATOR IMPLANT;  Surgeon: Jerel Balding, MD;  [facility] model  [id number] serial number [id number]   IR CT HEAD LTD  12/01/2023   IR PERCUTANEOUS ART THROMBECTOMY/INFUSION INTRACRANIAL INC DIAG ANGIO  12/01/2023   IR US  GUIDE VASC ACCESS RIGHT  12/01/2023   LEFT AND RIGHT HEART CATHETERIZATION WITH CORONARY ANGIOGRAM N/A 04/02/2014   Procedure: LEFT AND RIGHT HEART CATHETERIZATION WITH CORONARY ANGIOGRAM;  Surgeon: Debby DELENA Burnard, MD;  Location: [facility] CATH LAB;  Service: Cardiovascular;  Laterality: N/A;   MEDIAL COLLATERAL LIGAMENT REPAIR, KNEE Right 08/25/2015   Procedure: RIGHT THUMB RADICAL COLLATERAL LIGAMENT REPAIR;  Surgeon: Donnice Robinsons, MD;  Location: [facility] OR;  Service: Orthopedics;  Laterality: Right;   PERCUTANEOUS CORONARY STENT INTERVENTION (PCI-S) N/A 04/03/2014   Procedure: PERCUTANEOUS CORONARY STENT INTERVENTION (PCI-S);  Surgeon: Peter M Jordan, MD;  Location: [facility] CATH LAB;  Service: Cardiovascular;  Laterality: N/A;   RADIOLOGY WITH ANESTHESIA N/A 12/01/2023   Procedure: RADIOLOGY WITH ANESTHESIA;  Surgeon: Radiologist, Medication, MD;  Location: [facility] OR;  Service: Radiology;  Laterality: N/A;  BETHANY BRADFORD DILATION N/A 09/08/2018   Procedure: SAVORY DILATION;  Surgeon: Rollin Dover, MD;  Location: [facility] ENDOSCOPY;  Service: Endoscopy;  Laterality: N/A;   Patient Active Problem List   Diagnosis Date Noted   ICD (implantable cardioverter-defibrillator) discharge 03/23/2024   Hyperlipidemia 01/02/2024   CKD stage 3a, GFR 45-59 ml/min (HCC) 01/02/2024   Depression with anxiety 12/27/2023  Chronic systolic congestive heart failure (HCC) 12/23/2023   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   Acute ischemic right MCA stroke (HCC) 12/01/2023   Schizophrenia, unspecified (HCC) 03/07/2023   Recurrent major depression 03/07/2023   Pseudophakia of left eye 03/07/2023   Acid reflux 03/07/2023   Dyspepsia 03/07/2023   Anxiety state 03/07/2023   Cortical age-related cataract of right eye 03/07/2023   Psychotic disorder (HCC) 03/07/2023    CAD (coronary artery disease) 03/07/2023   Abdominal mass 07/12/2021   Peripheral vascular disease 02/04/2021   Overweight 01/21/2020   Dysphagia 12/04/2018   Hiatal hernia 04/19/2018   Gastroesophageal reflux disease 04/03/2018   Ischemic congestive cardiomyopathy (HCC) 10/17/2017   Generalized anxiety disorder 10/17/2017   Prediabetes 10/16/2017   Acquired thrombocytopenia 10/16/2017   Chronic kidney disease, stage 3 (HCC) 10/16/2017   Primary erectile dysfunction 10/13/2017   Chronic combined systolic and diastolic heart failure (HCC) 12/30/2015   Traumatic rupture of collateral ligament of other finger at metacarpophalangeal and interphalangeal joint, subsequent encounter 10/30/2015   Traumatic rupture of collateral ligament of other finger at metacarpophalangeal and interphalangeal joint, initial encounter 08/12/2015   VT (ventricular tachycardia) (HCC) 12/31/2014   ICD (implantable cardioverter-defibrillator) in place 09/10/2014   At risk for sudden cardiac death August 30, 2014   Systolic heart failure (HCC) 08/09/2014   Coronary artery disease involving native coronary artery of native heart without angina pectoris 04/04/2014   Unstable angina (HCC) 04/03/2014   Cardiomyopathy, ischemic 03/22/2014   History of CVA (cerebrovascular accident) 02/20/2014   Human immunodeficiency virus infection (HCC) 02/20/2014   Essential hypertension 02/20/2014   Hyperlipidemia LDL goal <70 02/20/2014   Depression 02/20/2014   Cerebral infarction (HCC) 08/09/2006    ONSET DATE: 05/15/24 referral    REFERRING DIAG: I63.9 (ICD-10-CM) - Cerebral infarction, unspecified   THERAPY DIAG:  Unsteadiness on feet  Other symptoms and signs involving the nervous system  Muscle weakness (generalized)  Abnormal posture  Difficulty in walking, not elsewhere classified  Other abnormalities of gait and mobility  Rationale for Evaluation and Treatment: Rehabilitation  SUBJECTIVE:                                                                                                                                                                                              SUBJECTIVE STATEMENT:  Pt denies any acute changes since last visit. ***Pt denies any acute changes since last, no falls, having some aching pain in L knuckles. Pt has been working on his HEP, has a sheet to keep track from OT. Pt has been working on walking at home, using his HW.  Pt  accompanied by: significant other, wife Veronica  PERTINENT HISTORY: CAD, GERD, HIV, HTN, ischemic cardiomyopathy, CVA  PAIN:  Are you having pain? No  PRECAUTIONS: Fall and ICD/Pacemaker; L hemi   PATIENT GOALS: to walk and use my hand   OBJECTIVE:  Note: Objective measures were completed at Evaluation unless otherwise noted.  DIAGNOSTIC FINDINGS: 12/01/23 head CT IMPRESSION: 1. Subtle hypoattenuation involving the right caudate and right lentiform nuclei which may reflect acute infarct. 2. No acute intracranial hemorrhage. 3. Remote infarct involving the right frontal operculum, insula, and superior right temporal lobe. 4. Asymmetric density of the right M1 segment. Recommend correlation with CTA. 5. ASPECTS is 8                                                                                                              TREATMENT   Self-Care Vitals:   09/14/24 1148  BP: 107/63  Pulse: (!) 53   BP assessed in RUE in sitting at rest. Vitals WNL for patient.   NMR To work on increased LLE NMR: Stance on LLE performing RLE 4 hurdle forwards/backwards step overs Manual assist for L quad activation and weight shift onto L side 3 x 10 reps LLE 1 hurdle forward/backward step-overs Manual assist to bring LE back to hip ext weakness 3 x 6-7 reps to fatigue Stance on LLE performing RLE 4 lateral hurdle step overs 2 x 10 reps Manual assist for L quad activation LLE lateral hiking pole step overs 2 x 10 reps   Physical  Performance For LTG assessment:  [facility] PT Assessment - 09/14/24 1158       Ambulation/Gait   Gait velocity 32.8 ft over 49.34 sec = 0.66 ft/sec      Standardized Balance Assessment   Standardized Balance Assessment Timed Up and Go Test;Five Times Sit to Stand    Five times sit to stand comments  20.35   RUE and SBQC     Timed Up and Go Test   TUG Normal TUG    Normal TUG (seconds) 57   with LBQC          PATIENT EDUCATION: Education details: continue HEP, PT POC with plan to add visits through what is approved by insurance*** Person educated: Patient and Spouse Education method: Explanation, Demonstration, Tactile cues, and Verbal cues Education comprehension: verbalized understanding, returned demonstration, verbal cues required, tactile cues required, and needs further education  HOME EXERCISE PROGRAM: Access Code: [email or web address] URL: [email or web address] Date: 06/06/2024 Prepared by: Delon Pop  Exercises - Supine Bridge  - 1 x daily - 7 x weekly - 3 sets - 10 reps - Supine March with Posterior Pelvic Tilt  - 1 x daily - 7 x weekly - 3 sets - 10 reps - Mini Squat with Counter Support  - 1 x daily - 7 x weekly - 3 sets - 8-10 reps - Standing Knee Flexion with Counter Support  - 1 x daily - 7 x weekly - 3 sets - 6-8 reps - Standing March with Counter Support  -  1 x daily - 7 x weekly - 3 sets - 8-10 reps - Staggered Sit-to-Stand  - 1 x daily - 7 x weekly - 3 sets - 10 reps - Side Stepping with Counter Support  - 1 x daily - 7 x weekly - 3 sets - 10 reps - Mini Squat with Counter Support  - 1 x daily - 7 x weekly - 3 sets - 10 reps  GOALS: Goals reviewed with patient? Yes  SHORT TERM GOALS: Target date: 06/22/24  Pt will be independent with initial HEP for improved functional strength and balance  Baseline: to be provided  Goal status: MET  2.  Pt will improve gait speed to >/= .2m/s to demonstrate improved community ambulation  Baseline: .6m/s with  HW + CGA; .100m/s with LBQC + CGA Goal status: MET  3.  Pt will improve TUG to </= 70 secs to demonstrated reduced fall risk  Baseline: 98s with HW + MinA; 79s LBQC + CGA Goal status: IN PROGRESS  4.  Pt will improve 5x STS to </= 22 sec to demo improved functional LE strength and balance   Baseline: 30.81 with CGA + R UE; 24/5s no UE + CGA Goal status: IN PRPOGRESS   LONG TERM GOALS: Target date: 07/13/24 (to match cert date)   Pt will be independent with final HEP for improved functional strength and balance  Baseline: to be provided; provided Goal status: MET  Pt will improve gait speed to >/= .46m/s to demonstrate improved community ambulation  Baseline: .59m/s with HW + CGA; .40m/s LBQC + CGA Goal status: IN PROGRESS  Pt will improve TUG to </= 50 secs to demonstrated reduced fall risk  Baseline: 98s with HW + MinA; 66s LBQC + CGA Goal status: IN PROGRESS  Pt will improve 5x STS to </= 15 sec to demo improved functional LE strength and balance   Baseline: 30.81 with CGA + R UE; 24s R UE + CGA Goal status: IN PROGRESS  NEW SHORT TERM GOALS:       08/10/24  Pt will be compliant with updated HEP for improved functional strength and balance    Baseline: to be updated   Goal status: MET  2. Pt will improve gait speed to >/= .83m/s to demonstrate improved community ambulation Baseline: .64m/s LBQC + CGA, 0.45m/s LBQC and CGA (1/5) Goal status: IN PROGRESS  3. Pt will improve TUG to </= 50 secs to demonstrated reduced fall risk Baseline: 66s LBQC + CGA, 56.91 sec LBQC and CGA (1/5) Goal status: IN PROGRESS  4. Pt will improve 5x STS to </= 20 sec to demo improved functional LE strength and balance  Baseline:  24s R UE + CGA, 21 sec RUE and CGA (1/5) Goal status: IN PROGRESS   NEW LONG TERM GOALS: 09/07/24 *** Pt will be compliant with updated final HEP for improved functional strength and balance    Baseline: to be updated   Goal status: NEW  2. Pt will improve gait  speed to >/= .58m/s to demonstrate improved community ambulation Baseline: .52m/s with HW + CGA, 0.59m/s LBQC and CGA (1/5) Goal status: REVISED/DOWNGRADED  3. Pt will improve TUG to </= 50 secs to demonstrated reduced fall risk Baseline: 66s LBQC + CGA, 56.91 sec LBQC and CGA (1/5) Goal status: REVISED/DOWNGRADED  4. Pt will improve 5x STS to </= 18 sec to demo improved functional LE strength and balance  Baseline: 24s R UE + CGA, 21 sec RUE and CGA (1/5)  Goal status: REVISED/DOWNGRADED   NEW SHORT TERM GOALS:   Target date: {follow up:25551}  *** Baseline: *** Goal status: {GOALSTATUS:25110}  2.  *** Baseline: *** Goal status: {GOALSTATUS:25110}  3.  *** Baseline: *** Goal status: {GOALSTATUS:25110}  4.  *** Baseline: *** Goal status: {GOALSTATUS:25110}  5.  *** Baseline: *** Goal status: {GOALSTATUS:25110}  6.  *** Baseline: *** Goal status: {GOALSTATUS:25110}   NEW LONG TERM GOALS:  Target date: {follow up:25551}  *** Baseline: *** Goal status: {GOALSTATUS:25110}  2.  *** Baseline: *** Goal status: {GOALSTATUS:25110}  3.  *** Baseline: *** Goal status: {GOALSTATUS:25110}  4.  *** Baseline: *** Goal status: {GOALSTATUS:25110}  5.  *** Baseline: *** Goal status: {GOALSTATUS:25110}  6.  *** Baseline: *** Goal status: {GOALSTATUS:25110}    ASSESSMENT:  CLINICAL IMPRESSION: Emphasis of skilled PT session on*** working on HIGT on treadmill with BWS harness. Pt does well with HIGT this visit and is able to ambulate for a total of 10 min with several seated rest breaks every couple of minutes. He is able to maintain a consistent gait speed of 0.4 mph while walking on the treadmill. See above for gait deviations. He continues to benefit from skilled PT services to work on LLE NMR as well as to work towards increased safety and independence with his functional mobility. Continue POC.    OBJECTIVE IMPAIRMENTS: Abnormal gait, decreased balance,  decreased coordination, decreased endurance, decreased knowledge of condition, decreased knowledge of use of DME, decreased ROM, decreased strength, impaired tone, impaired UE functional use, and postural dysfunction.   ACTIVITY LIMITATIONS: carrying, lifting, bending, standing, squatting, stairs, transfers, bed mobility, locomotion level, and caring for others  PARTICIPATION LIMITATIONS: meal prep, cleaning, interpersonal relationship, driving, shopping, community activity, occupation, and yard work  PERSONAL FACTORS: Age, Fitness, Past/current experiences, Time since onset of injury/illness/exacerbation, Transportation, and 3+ comorbidities: see above are also affecting patient's functional outcome.   REHAB POTENTIAL: Fair time since onset  CLINICAL DECISION MAKING: Stable/uncomplicated  EVALUATION COMPLEXITY: Low  PLAN:  PT FREQUENCY: 2x/week  PT DURATION: other: 7 weeks  PLANNED INTERVENTIONS: 97164- PT Re-evaluation, 97750- Physical Performance Testing, 97110-Therapeutic exercises, 97530- Therapeutic activity, [id number]- Neuromuscular re-education, 97535- Self Care, [id number]- Manual therapy, [id number]- Gait training, [phone]- Orthotic Initial, [phone]- Orthotic/Prosthetic subsequent, [phone]- Aquatic Therapy, [phone] (1-2 muscles), 20561 (3+ muscles)- Dry Needling, Patient/Family education, Balance training, Stair training, Taping, Vestibular training, Visual/preceptual remediation/compensation, DME instructions, and Wheelchair mobility training  PLAN FOR NEXT SESSION: HIGT! Continue working with [facility], continue use of L LE, weight shifting in bars with toe taps, improve L hip flexion strength. Facilitate left weight shifting during ambulation/ literally anything to get more weight on the left leg (modified SLS, R LE step ups so he has to spend more time on L, etc); staggered sit to stands, L hip abd strengthening (resisted dot taps), did they get [facility]?, tall kneel at bench on mat table, resisted LLE forward  step, RLE hurdles with manual assist for LLE in stance  Add more visits beyond January? (3 for SLP, 4 for PT, OT plans to d/c)***    Waddell Southgate, PT Waddell Southgate, PT, DPT, CSRS   09/14/2024, 12:34 PM        "

## 2024-09-17 ENCOUNTER — Ambulatory Visit: Admitting: Physical Therapy

## 2024-09-17 ENCOUNTER — Ambulatory Visit: Admitting: Speech Pathology

## 2024-09-19 ENCOUNTER — Ambulatory Visit: Admitting: Speech Pathology

## 2024-09-20 ENCOUNTER — Ambulatory Visit: Admitting: Adult Health

## 2024-09-20 ENCOUNTER — Ambulatory Visit: Admitting: Family Medicine

## 2024-09-21 ENCOUNTER — Ambulatory Visit: Admitting: Physical Therapy

## 2024-09-25 ENCOUNTER — Ambulatory Visit: Admitting: Physical Therapy

## 2024-09-26 ENCOUNTER — Ambulatory Visit

## 2024-09-28 ENCOUNTER — Ambulatory Visit

## 2024-09-28 ENCOUNTER — Ambulatory Visit: Admitting: Physical Therapy

## 2024-10-01 ENCOUNTER — Ambulatory Visit: Admitting: Physical Therapy

## 2024-10-01 ENCOUNTER — Ambulatory Visit

## 2024-10-03 ENCOUNTER — Ambulatory Visit: Admitting: Physical Therapy

## 2024-10-05 ENCOUNTER — Encounter: Payer: Medicare (Managed Care) | Attending: Registered Nurse | Admitting: Physical Medicine & Rehabilitation

## 2024-10-05 ENCOUNTER — Ambulatory Visit: Payer: Medicare Other

## 2024-10-08 ENCOUNTER — Ambulatory Visit: Admitting: Physical Therapy

## 2024-10-08 ENCOUNTER — Ambulatory Visit

## 2024-10-11 ENCOUNTER — Ambulatory Visit: Admitting: Physical Therapy

## 2024-10-12 ENCOUNTER — Ambulatory Visit: Payer: Medicare Other

## 2024-10-25 ENCOUNTER — Ambulatory Visit: Admitting: Cardiovascular Disease

## 2025-01-04 ENCOUNTER — Ambulatory Visit: Payer: Medicare Other

## 2025-01-07 ENCOUNTER — Ambulatory Visit

## 2025-04-05 ENCOUNTER — Ambulatory Visit: Payer: Medicare Other

## 2025-04-08 ENCOUNTER — Ambulatory Visit

## 2025-07-05 ENCOUNTER — Ambulatory Visit: Payer: Medicare Other

## 2025-07-08 ENCOUNTER — Ambulatory Visit

## 2025-10-04 ENCOUNTER — Ambulatory Visit: Payer: Medicare Other

## 2025-10-07 ENCOUNTER — Ambulatory Visit

## 2026-01-03 ENCOUNTER — Ambulatory Visit: Payer: Medicare Other

## 2026-01-07 ENCOUNTER — Ambulatory Visit

## 2026-04-04 ENCOUNTER — Ambulatory Visit: Payer: Medicare Other

## 2026-04-07 ENCOUNTER — Ambulatory Visit

## 2026-07-04 ENCOUNTER — Ambulatory Visit: Payer: Medicare Other

## 2026-07-07 ENCOUNTER — Ambulatory Visit

## 2026-10-03 ENCOUNTER — Ambulatory Visit: Payer: Medicare Other

## 2026-10-06 ENCOUNTER — Ambulatory Visit
